# Patient Record
Sex: Male | Born: 1975 | Race: White | Hispanic: No | Smoking: Current every day smoker
Health system: Southern US, Community
[De-identification: ages and names within clinical notes are randomized; demographics above are authoritative.]

## PROBLEM LIST (undated history)

## (undated) DIAGNOSIS — L0390 Cellulitis, unspecified: Secondary | ICD-10-CM

## (undated) DIAGNOSIS — F1910 Other psychoactive substance abuse, uncomplicated: Secondary | ICD-10-CM

## (undated) DIAGNOSIS — F329 Major depressive disorder, single episode, unspecified: Secondary | ICD-10-CM

## (undated) DIAGNOSIS — B1920 Unspecified viral hepatitis C without hepatic coma: Secondary | ICD-10-CM

## (undated) DIAGNOSIS — F32A Depression, unspecified: Secondary | ICD-10-CM

## (undated) DIAGNOSIS — F319 Bipolar disorder, unspecified: Secondary | ICD-10-CM

## (undated) HISTORY — PX: ROTATOR CUFF REPAIR: SHX139

## (undated) HISTORY — PX: SHOULDER SURGERY: SHX246

## (undated) HISTORY — PX: BACK SURGERY: SHX140

---

## 2002-02-14 ENCOUNTER — Inpatient Hospital Stay (HOSPITAL_COMMUNITY): Admission: EM | Admit: 2002-02-14 | Discharge: 2002-02-23 | Payer: Self-pay | Admitting: *Deleted

## 2004-07-16 ENCOUNTER — Emergency Department (HOSPITAL_COMMUNITY): Admission: EM | Admit: 2004-07-16 | Discharge: 2004-07-16 | Payer: Self-pay

## 2004-07-27 ENCOUNTER — Inpatient Hospital Stay (HOSPITAL_COMMUNITY): Admission: RE | Admit: 2004-07-27 | Discharge: 2004-08-03 | Payer: Self-pay | Admitting: Psychiatry

## 2004-07-27 ENCOUNTER — Ambulatory Visit: Payer: Self-pay | Admitting: Psychiatry

## 2010-01-02 ENCOUNTER — Emergency Department (HOSPITAL_COMMUNITY): Admission: EM | Admit: 2010-01-02 | Discharge: 2010-01-02 | Payer: Self-pay | Admitting: Emergency Medicine

## 2010-10-17 ENCOUNTER — Emergency Department (HOSPITAL_COMMUNITY)
Admission: EM | Admit: 2010-10-17 | Discharge: 2010-10-18 | Payer: Self-pay | Source: Home / Self Care | Admitting: Emergency Medicine

## 2011-01-01 LAB — DIFFERENTIAL
Basophils Absolute: 0 10*3/uL (ref 0.0–0.1)
Eosinophils Absolute: 0.3 10*3/uL (ref 0.0–0.7)
Eosinophils Relative: 2 % (ref 0–5)
Lymphocytes Relative: 25 % (ref 12–46)
Lymphs Abs: 3.5 10*3/uL (ref 0.7–4.0)
Monocytes Absolute: 1.9 10*3/uL — ABNORMAL HIGH (ref 0.1–1.0)
Monocytes Relative: 13 % — ABNORMAL HIGH (ref 3–12)

## 2011-01-01 LAB — BASIC METABOLIC PANEL
BUN: 8 mg/dL (ref 6–23)
CO2: 29 mEq/L (ref 19–32)
Calcium: 9.3 mg/dL (ref 8.4–10.5)
Chloride: 102 mEq/L (ref 96–112)
Creatinine, Ser: 0.69 mg/dL (ref 0.4–1.5)
GFR calc non Af Amer: >60 mL/min (ref >60)
Potassium: 4.1 mEq/L (ref 3.5–5.1)
Sodium: 139 mEq/L (ref 135–145)

## 2011-01-01 LAB — RAPID URINE DRUG SCREEN, HOSP PERFORMED
Barbiturates: POSITIVE — AB
Cocaine: NOT DETECTED
Opiates: NOT DETECTED

## 2011-01-01 LAB — URINALYSIS, ROUTINE W REFLEX MICROSCOPIC
Leukocytes, UA: NEGATIVE
Specific Gravity, Urine: 1.029 (ref 1.005–1.030)
Urobilinogen, UA: 1 mg/dL (ref 0.0–1.0)

## 2011-01-01 LAB — HEPATIC FUNCTION PANEL
Alkaline Phosphatase: 66 U/L (ref 39–117)
Bilirubin, Direct: 0.1 mg/dL (ref 0.0–0.3)
Total Bilirubin: 0.7 mg/dL (ref 0.3–1.2)
Total Protein: 7.9 g/dL (ref 6.0–8.3)

## 2011-01-01 LAB — URINE MICROSCOPIC-ADD ON

## 2011-01-01 LAB — CBC: Hemoglobin: 15.6 g/dL (ref 13.0–17.0)

## 2011-01-29 ENCOUNTER — Emergency Department (HOSPITAL_COMMUNITY)
Admission: EM | Admit: 2011-01-29 | Discharge: 2011-01-30 | Disposition: A | Discharge status: Home / Self Care | Payer: Self-pay | Attending: Emergency Medicine | Admitting: Emergency Medicine

## 2011-01-29 DIAGNOSIS — F411 Generalized anxiety disorder: Secondary | ICD-10-CM | POA: Insufficient documentation

## 2011-01-29 DIAGNOSIS — R259 Unspecified abnormal involuntary movements: Secondary | ICD-10-CM | POA: Insufficient documentation

## 2011-01-29 DIAGNOSIS — I10 Essential (primary) hypertension: Secondary | ICD-10-CM | POA: Insufficient documentation

## 2011-01-29 DIAGNOSIS — F3130 Bipolar disorder, current episode depressed, mild or moderate severity, unspecified: Secondary | ICD-10-CM | POA: Insufficient documentation

## 2011-01-29 DIAGNOSIS — IMO0002 Reserved for concepts with insufficient information to code with codable children: Secondary | ICD-10-CM | POA: Insufficient documentation

## 2011-01-29 DIAGNOSIS — F10229 Alcohol dependence with intoxication, unspecified: Secondary | ICD-10-CM | POA: Insufficient documentation

## 2011-01-29 DIAGNOSIS — Z79899 Other long term (current) drug therapy: Secondary | ICD-10-CM | POA: Insufficient documentation

## 2011-01-29 LAB — COMPREHENSIVE METABOLIC PANEL
ALT: 48 U/L (ref 0–53)
AST: 31 U/L (ref 0–37)
CO2: 24 mEq/L (ref 19–32)
Creatinine, Ser: 0.75 mg/dL (ref 0.4–1.5)
Potassium: 3.6 mEq/L (ref 3.5–5.1)
Sodium: 142 mEq/L (ref 135–145)

## 2011-01-29 LAB — CBC
HCT: 42.2 % (ref 39.0–52.0)
Hemoglobin: 15.2 g/dL (ref 13.0–17.0)
MCHC: 36 g/dL (ref 30.0–36.0)
MCV: 87.6 fL (ref 78.0–100.0)
Platelets: 326 10*3/uL (ref 150–400)
RDW: 13 % (ref 11.5–15.5)

## 2011-01-29 LAB — DIFFERENTIAL
Basophils Relative: 0 % (ref 0–1)
Eosinophils Relative: 4 % (ref 0–5)
Monocytes Absolute: 1.2 10*3/uL — ABNORMAL HIGH (ref 0.1–1.0)
Neutro Abs: 4.5 10*3/uL (ref 1.7–7.7)
Neutrophils Relative %: 47 % (ref 43–77)

## 2011-01-29 LAB — ETHANOL: Alcohol, Ethyl (B): 127 mg/dL — ABNORMAL HIGH (ref 0–10)

## 2011-01-29 LAB — URINALYSIS, ROUTINE W REFLEX MICROSCOPIC
Ketones, ur: NEGATIVE mg/dL
Leukocytes, UA: NEGATIVE
Nitrite: NEGATIVE
Urobilinogen, UA: 1 mg/dL (ref 0.0–1.0)
pH: 5.5 (ref 5.0–8.0)

## 2011-01-29 LAB — RAPID URINE DRUG SCREEN, HOSP PERFORMED
Amphetamines: NOT DETECTED
Barbiturates: NOT DETECTED
Cocaine: POSITIVE — AB
Opiates: NOT DETECTED

## 2011-01-29 LAB — URINE MICROSCOPIC-ADD ON

## 2011-03-09 NOTE — Discharge Summary (Signed)
[name] [name], KUAN NO.:  [id number]   MEDICAL RECORD NO.:  [id number]          PATIENT TYPE:  IPS   LOCATION:  0301                          FACILITY:  [facility]   PHYSICIAN:  Jeanice Lim, M.D. DATE OF BIRTH:  07/19/1976   DATE OF ADMISSION:  07/27/2004  DATE OF DISCHARGE:  08/03/2004                                 DISCHARGE SUMMARY   IDENTIFYING DATA:  This is a 35 year old single Caucasian male voluntarily  admitted.  Presenting with a history of alcohol and drug use.  Relapsed two  weeks ago, drinking liquor and beer, up to a fifth daily, using crack and IV  heroin two days ago.  Had been using IV heroin for six years.  Reason for  relapsing, the patient reported that he was doing too well.  Decreased  appetite for six days, impaired sleep, reported mood swings, psychotic  symptoms, behavioral changes and conflict, including fights while  intoxicated.   MEDICATIONS:  Lexapro and Cardura.   ALLERGIES:  PENICILLIN.   PHYSICAL EXAMINATION:  Physical and neurologic exam essentially within  normal limits.   LABORATORY DATA:  Routine admission labs essentially within normal limits.   MENTAL STATUS EXAM:  In bed, unkempt.  Little eye contact.  Speech concrete.  Some poverty of thought.  Mood tired.  Affect flat.  Thought processes goal  directed.  Thought content negative for dangerous ideation, reporting  fleeting suicidal thoughts that come and go.  Judgment and insight were  quite poor and patient was somewhat resistant to treatment recommendations  and participating in treatment.   ADMISSION DIAGNOSES:   AXIS I:  1.  Polysubstance abuse.  2.  Alcohol and cocaine dependence.  3.  Heroin dependence.   AXIS II:  Deferred.   AXIS III:  Hypertension.   AXIS IV:  Moderate (problems with primary support group, financial  stressors, limited insight into severity of addiction and history of  noncompliance).   AXIS V:  30/55.   HOSPITAL COURSE:  The  patient was admitted and ordered routine p.r.n.  medications and underwent further monitoring.  Was encouraged to participate  in individual, group and milieu therapy.  Was placed on detox protocol for  safe withdrawal with close monitoring.  Was encouraged to participated in  substance abuse treatment groups.  Was stabilized on medications targeting  mood.  The patient had been drinking large amounts of alcohol and taking  benzodiazepines.  Had clear withdrawal symptoms, requiring p.r.n. detox  medications.  The patient also reported auditory hallucinations, stating  command for him to kill himself.  He was able to resist this and contract  for safety while in the hospital.  He is optimized on Tegretol, Neurontin  and Risperdal.  Reported fleeting suicidal ideation and severe anxiety with  gradual improvement as he was stabilized on medications.  The patient was  discharged eventually after resolution of dangerous ideation, improvement in  mood.  Mood was euthymic.  Affect brighter.  The patient reported motivation  to be compliant with aftercare plan.  Awareness of significance of addiction  and  impact on mood and health and judgment.  The patient was given  medication education.   DISCHARGE MEDICATIONS:  1.  Cardura 4 mg daily.  2.  Multivitamin.  3.  Lexapro 10 mg q.a.m.  4.  Risperdal 1 mg, 1/2 q.a.m., 1/2 at 4 p.m. and 2 q.h.s.  5.  Trazodone 100 mg, 3 q.h.s.  6.  Tegretol XR 200 mg, 1 q.a.m. and 2 q.h.s.  7.  Vistaril 50 mg q.8h. p.r.n. anxiety.  8.  Neurontin 400 mg at 9 a.m., 4 p.m. and 2 q.h.s.  9.  Cogentin 0.5 mg q.a.m., at 4 p.m. and q.h.s.   FOLLOW UP:  The patient was to follow up with medical follow-up.  Avoid  alcohol and drug use.  Follow up with Dr. Rogelia Mire and walk-in clinic at [facility]  Monday to Friday, 1-4, on [place].   DISCHARGE DIAGNOSES:   AXIS I:  1.  Polysubstance abuse.  2.  Alcohol and cocaine dependence.  3.  Heroin dependence.   AXIS II:   Deferred.   AXIS III:  Hypertension.   AXIS IV:  Moderate (problems with primary support group, financial  stressors, limited insight into severity of addiction and history of  noncompliance).   AXIS V:  Global Assessment of Functioning on discharge 55.     [name] [name]   JEM/MEDQ  D:  09/10/2004  T:  09/10/2004  Job:  [id number]
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
## 2011-03-09 NOTE — Discharge Summary (Signed)
[facility]  Patient:    [name] [name], [name] [name] Visit Number: [id number] MRN: [id number]          Service Type: PSY Location: 300 0300 01 Attending Physician:  Jeanice Lim Dictated by:   Jeanice Lim, M.D. Admit Date:  02/14/2002 Discharge Date: 02/23/2002                             Discharge Summary  IDENTIFYING DATA:  This is a 35 year old separated white male voluntarily admitted for depression, suicidal ideation, alcohol and drug use.  The patient had a history of bipolar disorder.  This was the first hospitalization at [facility]. He had a history of a suicide attempt where he swallowed nitroglycerin pills years ago.  Was detoxified off alcohol and drugs three to four years ago in the past.  ADMISSION MEDICATIONS: 1. Klonopin 2 mg b.i.d. for seizures. 2. Cardura 4 mg q.h.s. 3. Toprol XL 50 mg q.d. for mitral valve prolapse. 4. Nexium 20 mg q.h.s.  ALLERGIES:  KEFLEX.  PHYSICAL EXAMINATION:  GENERAL:  Essentially within normal limits.  NEUROLOGIC:  Nonfocal.  ROUTINE ADMISSION LABORATORY DATA:  Urine drug screen: Positive for barbiturates, benzodiazepines, cannabis, and opiates.  Alcohol level: Less than 10.  White count slightly elevated at 11.2; otherwise, within normal limits.  MENTAL STATUS EXAMINATION:  Alert, young adult, casually dressed male with long hair, experiencing withdrawal symptoms, having abdominal cramping. Speech: Within normal limits.  Mood: Dysphoric and anxious.  Affect: Flat. Thought process: Goal directed, positive for hallucinations, both visual and auditory, denied suicidal or homicidal ideations.  Cognitive: Intact. Judgment and insight: Limited with a history of poor impulse control.  ADMITTING DIAGNOSES: Axis I:    1. Bipolar disorder.            2. Alcohol abuse.            3. Opiate dependence.            4. Cocaine abuse. Axis II:   None. Axis III:  1. Hypertension.            2. Gastroesophageal  reflux disease.            3. Mitral valve prolapse.            4. Seizure disorder. Axis IV:   Moderate problems with primary support, social environment,            housing, legal system, and other psychosocial issues. Axis V:    30/65  HOSPITAL COURSE:  The patient was admitted and ordered routine p.r.n. medications, was started on a phenobarbital detoxification protocol with monitoring for safe withdrawal as well as a Catapres detoxification protocol for detoxification off of opiates.  The patient was titrated on Zyprexa, targeting psychotic symptoms and Loxitane was used for acute control of psychotic symptoms.  Seroquel was further titrated for long-term treatment. The patient was given Symmetrel for cravings.  He tolerated medication changes well and reported no withdrawal symptoms.  CONDITION AT DISCHARGE:  The patient was more calm.  Mood: More euthymic. Affect: Brighter.  Thought process: Goal directed.  Thought content: Negative for dangerous ideation or psychotic symptoms.  DISCHARGE MEDICATIONS: 1. Remeron 15 mg three q.h.s. 2. Seroquel 200 mg one half t.i.d. and two and a half q.h.s. 3. Cardura 4 mg q.a.m. 4. Protonix 40 mg q.a.m. 5. Claritin 10 mg q.a.m. 6. Loxitane 10 mg t.i.d. 7. Klonopin 2 mg b.i.d. 8. Toprol  XL 50 mg q.a.m.  FOLLOWUP:  The patient was discharged to be treated at the [facility] for continued substance abuse treatment.  DISCHARGE DIAGNOSES: Axis I:    1. Bipolar disorder.            2. Alcohol abuse.            3. Opiate dependence.            4. Cocaine abuse. Axis II:   None. Axis III:  1. Hypertension.            2. Gastroesophageal reflux disease.            3. Mitral valve prolapse.            4. Seizure disorder. Axis IV:   Moderate problems with primary support, social environment,            housing, legal system, and other psychosocial issues. Axis V:    Global assessment of functioning on discharge was  55-60. Dictated by:   Jeanice Lim, M.D. Attending Physician:  Jeanice Lim DD:  04/01/02 TD:  04/04/02 Job: [id number]

## 2011-03-09 NOTE — H&P (Signed)
NAME:  [name] [name], [name] [name] NO.:  [id number]   MEDICAL RECORD NO.:  [id number]          PATIENT TYPE:  IPS   LOCATION:  0301                          FACILITY:  [facility]   PHYSICIAN:  Jeanice Lim, M.D. DATE OF BIRTH:  02-03-1976   DATE OF ADMISSION:  07/27/2004  DATE OF DISCHARGE:                         PSYCHIATRIC ADMISSION ASSESSMENT   IDENTIFYING INFORMATION:  A 35 year old single white male who was  voluntarily admitted on July 27, 2004.   HISTORY OF PRESENT ILLNESS:  The patient presents with a history of alcohol  and drug use.  The patient relapsed approximately 2 weeks ago, had been  drinking both liquor and beer, up to 2 cases with a fifth daily.  He has  been using crack cocaine and IV heroin, with the last use of 2 days ago.  The patient reports using IV heroin for approximately 6 years.  The patient  states that the reason he relapsed was that he was doing too good.  He  reports a decreased appetite for the past 6 days, also reports decrease in  sleep.  He denies any mood swings or psychosis.  He states that his behavior  changes when he drinks, that he will have problems with fighting.   PAST PSYCHIATRIC HISTORY:  First admission to [facility], no  outpatient treatment, was detoxed once before.  His longest history of  sobriety has been 7 months.  That was this year.   SOCIAL HISTORY:  He is a 35 year old single white male who has 2 children  ages 34 and 35.  The children are with the mother of those children.  He is  currently living at [facility] x6 months and is intending to return there.  He works in [employer].  No legal problems, and received his GED.   FAMILY HISTORY:  Father and uncle both with alcohol problems.   ALCOHOL DRUG HISTORY:  The patient smokes cigarettes.  His last drink was on  July 26, 2004.  He states he normally drinks alone, does drink in the  morning.  His first drink was at the age of 60, reports problems  with DTs and  seizures in the past.   PAST MEDICAL HISTORY:  Primary care Sherrine Salberg is Dr. Tiburcio Pea in [place].  Medical problems are hypertension.   MEDICATIONS:  Cardura 4 mg daily, Lexapro 10 mg, has been on that for 8-9  months and reports compliance.   DRUG ALLERGIES:  PENICILLIN.   PHYSICAL EXAMINATION:  The patient was assessed at [facility].  He is  disheveled, with poor dental hygiene.  There is no tremor indicated.  Neurological findings are intact.  Vital signs:  Temperature 97.8, 94 heart  rate, 20 respirations, blood pressure 146/75.  Five feet 9 inches tall, 220  pounds.  His CBC is within normal limits. BMET is within normal limits.  Acetaminophen level less than 10, salicylate level less than 4.  Urine drug  screen is positive for cocaine, positive for THC.  Alcohol level was 119.   MENTAL STATUS EXAM:  He is in the bed, he  is unkempt.  He has little eye  contact.  Speech is concrete.  Mood is tired, but patient also appears flat  and tired appearing.  Thought processes are coherent.  Cognitive function  intact.  Memory is fair, judgment is poor, insight is fair.  The patient is  seeking help for his substance abuse.   ADMISSION DIAGNOSES:   AXIS I:  1.  Polysubstance abuse/dependence.  2.  Depressive disorder not otherwise specified.Marland Kitchen   AXIS II:  Deferred.   AXIS III:  Hypertension.   AXIS IV:  Problems with drug use, primary support group, housing.   AXIS V:  Current is 35, past year is 63.   PLAN:  Admission for polysubstance abuse.  Contract for safety, stabilize  mood and thinking.  We will detox safely, encourage fluids, work on relapse.  The patient is to attend AA and NA after discharge, remain alcohol and drug  free.  The patient is to follow up with his primary care physician for his  blood pressure and mental health for his depressive symptoms.   TENTATIVE LENGTH OF CARE:  5-6 days.     Jani   JO/MEDQ  D:  07/29/2004  T:  07/29/2004  Job:   [id number]

## 2013-01-26 ENCOUNTER — Encounter (HOSPITAL_COMMUNITY): Payer: Self-pay | Admitting: Behavioral Health

## 2013-01-26 ENCOUNTER — Inpatient Hospital Stay (HOSPITAL_COMMUNITY)
Admission: EM | Admit: 2013-01-26 | Discharge: 2013-02-05 | DRG: 897 | Disposition: A | Discharge status: Home / Self Care | Payer: Medicare Other | Source: Intra-hospital | Attending: Psychiatry | Admitting: Psychiatry

## 2013-01-26 ENCOUNTER — Encounter (HOSPITAL_COMMUNITY): Payer: Self-pay | Admitting: Emergency Medicine

## 2013-01-26 ENCOUNTER — Emergency Department (HOSPITAL_COMMUNITY)
Admission: EM | Admit: 2013-01-26 | Discharge: 2013-01-26 | Disposition: A | Discharge status: Other Acute Inpatient Hospital | Payer: Medicare Other | Source: Home / Self Care

## 2013-01-26 DIAGNOSIS — F1910 Other psychoactive substance abuse, uncomplicated: Secondary | ICD-10-CM | POA: Insufficient documentation

## 2013-01-26 DIAGNOSIS — F17200 Nicotine dependence, unspecified, uncomplicated: Secondary | ICD-10-CM | POA: Insufficient documentation

## 2013-01-26 DIAGNOSIS — F10239 Alcohol dependence with withdrawal, unspecified: Principal | ICD-10-CM

## 2013-01-26 DIAGNOSIS — F411 Generalized anxiety disorder: Secondary | ICD-10-CM | POA: Diagnosis present

## 2013-01-26 DIAGNOSIS — F10939 Alcohol use, unspecified with withdrawal, unspecified: Principal | ICD-10-CM | POA: Diagnosis present

## 2013-01-26 DIAGNOSIS — F1010 Alcohol abuse, uncomplicated: Secondary | ICD-10-CM

## 2013-01-26 DIAGNOSIS — F0630 Mood disorder due to known physiological condition, unspecified: Secondary | ICD-10-CM | POA: Diagnosis present

## 2013-01-26 DIAGNOSIS — F10932 Alcohol use, unspecified with withdrawal with perceptual disturbance: Secondary | ICD-10-CM | POA: Diagnosis present

## 2013-01-26 DIAGNOSIS — F1920 Other psychoactive substance dependence, uncomplicated: Secondary | ICD-10-CM | POA: Diagnosis present

## 2013-01-26 DIAGNOSIS — Z79899 Other long term (current) drug therapy: Secondary | ICD-10-CM

## 2013-01-26 DIAGNOSIS — F1020 Alcohol dependence, uncomplicated: Secondary | ICD-10-CM | POA: Diagnosis present

## 2013-01-26 HISTORY — DX: Bipolar disorder, unspecified: F31.9

## 2013-01-26 LAB — COMPREHENSIVE METABOLIC PANEL
ALT: 89 U/L — ABNORMAL HIGH (ref 0–53)
BUN: 5 mg/dL — ABNORMAL LOW (ref 6–23)
CO2: 29 mEq/L (ref 19–32)
Chloride: 107 mEq/L (ref 96–112)
Creatinine, Ser: 0.59 mg/dL (ref 0.50–1.35)
GFR calc Af Amer: >90 mL/min (ref >90)
Glucose, Bld: 85 mg/dL (ref 70–99)
Total Bilirubin: 0.2 mg/dL — ABNORMAL LOW (ref 0.3–1.2)

## 2013-01-26 LAB — RAPID URINE DRUG SCREEN, HOSP PERFORMED
Amphetamines: NOT DETECTED
Barbiturates: NOT DETECTED
Benzodiazepines: NOT DETECTED
Cocaine: NOT DETECTED
Opiates: NOT DETECTED
Tetrahydrocannabinol: POSITIVE — AB

## 2013-01-26 LAB — CBC WITH DIFFERENTIAL/PLATELET
Hemoglobin: 15.1 g/dL (ref 13.0–17.0)
Lymphocytes Relative: 29 % (ref 12–46)
MCHC: 34.2 g/dL (ref 30.0–36.0)
Neutro Abs: 5.9 10*3/uL (ref 1.7–7.7)
Neutrophils Relative %: 56 % (ref 43–77)
RBC: 4.66 MIL/uL (ref 4.22–5.81)
RDW: 13.7 % (ref 11.5–15.5)

## 2013-01-26 LAB — URINALYSIS, ROUTINE W REFLEX MICROSCOPIC
Ketones, ur: NEGATIVE mg/dL
Protein, ur: NEGATIVE mg/dL
Specific Gravity, Urine: 1.011 (ref 1.005–1.030)

## 2013-01-26 MED ORDER — FOLIC ACID 1 MG PO TABS
1.0000 mg | ORAL_TABLET | Freq: Every day | ORAL | Status: DC
  Filled 2013-01-26: qty 1

## 2013-01-26 MED ORDER — LORAZEPAM 1 MG PO TABS: 0.0000 mg | ORAL_TABLET | Freq: Two times a day (BID) | ORAL | Status: DC

## 2013-01-26 MED ORDER — THIAMINE HCL 100 MG/ML IJ SOLN: 100.0000 mg | Freq: Every day | INTRAMUSCULAR | Status: DC

## 2013-01-26 MED ORDER — IBUPROFEN 600 MG PO TABS: 600.0000 mg | ORAL_TABLET | Freq: Three times a day (TID) | ORAL | Status: DC | PRN

## 2013-01-26 MED ORDER — NICOTINE 21 MG/24HR TD PT24
21.0000 mg | MEDICATED_PATCH | Freq: Every day | TRANSDERMAL | Status: DC
  Filled 2013-01-26: qty 1

## 2013-01-26 MED ORDER — ZOLPIDEM TARTRATE 5 MG PO TABS: 5.0000 mg | ORAL_TABLET | Freq: Every evening | ORAL | Status: DC | PRN

## 2013-01-26 MED ORDER — LORAZEPAM 1 MG PO TABS: 1.0000 mg | ORAL_TABLET | Freq: Four times a day (QID) | ORAL | Status: DC | PRN

## 2013-01-26 MED ORDER — ALUM & MAG HYDROXIDE-SIMETH 200-200-20 MG/5ML PO SUSP: 30.0000 mL | ORAL | Status: DC | PRN

## 2013-01-26 MED ORDER — ADULT MULTIVITAMIN W/MINERALS CH: 1.0000 | ORAL_TABLET | Freq: Every day | ORAL | Status: DC

## 2013-01-26 MED ORDER — VITAMIN B-1 100 MG PO TABS: 100.0000 mg | ORAL_TABLET | Freq: Every day | ORAL | Status: DC

## 2013-01-26 MED ORDER — LORAZEPAM 2 MG/ML IJ SOLN: 1.0000 mg | Freq: Four times a day (QID) | INTRAMUSCULAR | Status: DC | PRN

## 2013-01-26 MED ORDER — FOLIC ACID 1 MG PO TABS: 1.0000 mg | ORAL_TABLET | Freq: Every day | ORAL | Status: DC

## 2013-01-26 MED ORDER — ADULT MULTIVITAMIN W/MINERALS CH
1.0000 | ORAL_TABLET | Freq: Every day | ORAL | Status: DC
  Filled 2013-01-26: qty 1

## 2013-01-26 MED ORDER — ACETAMINOPHEN 325 MG PO TABS: 650.0000 mg | ORAL_TABLET | ORAL | Status: DC | PRN

## 2013-01-26 MED ORDER — ONDANSETRON HCL 4 MG PO TABS: 4.0000 mg | ORAL_TABLET | Freq: Three times a day (TID) | ORAL | Status: DC | PRN

## 2013-01-26 MED ORDER — LORAZEPAM 1 MG PO TABS
0.0000 mg | ORAL_TABLET | Freq: Four times a day (QID) | ORAL | Status: DC
  Filled 2013-01-26: qty 1

## 2013-01-26 NOTE — ED Provider Notes (Signed)
History     CSN: [id number]  Arrival date & time 01/26/13  1056   First MD Initiated Contact with Patient 01/26/13 1132      Chief Complaint  Patient presents with  . Medical Clearance    (Consider location/radiation/quality/duration/timing/severity/associated sxs/prior treatment) HPI  Patient presents to the emergency department requesting detox from alcohol, Xanax, heroin, methadone and marijuana. He is currently intoxicated and last used an hour ago. He is brought in by 2 friends who have encouraged him to come. The patient says he has tried detox in the past but was unsuccessful. He denies having any suicidal or homicidal ideations. Patient is currently altered because of alcohol intoxication but denies taking more than he normally does. Denies any medical problems and he needs treatment for. Denies any harm to himself. Level V caveat, intoxicated   Friends/family say that he has been using since he was 13 and has seizures when he withdrawals.   No past medical history on file.  Past Surgical History  Procedure Laterality Date  . Back surgery    . Shoulder surgery      No family history on file.  History  Substance Use Topics  . Smoking status: Current Every Day Smoker    Types: Cigarettes  . Smokeless tobacco: Not on file  . Alcohol Use: Yes      Review of Systems Level V caveat, intoxicated   Allergies  Review of patient's allergies indicates no known allergies.  Home Medications  No current outpatient prescriptions on file.  BP 115/71  Pulse 91  Temp(Src) 97.4 F (36.3 C) (Oral)  Resp 16  SpO2 99%  Physical Exam  Nursing note and vitals reviewed. Constitutional: He appears well-developed and well-nourished. No distress.  discheveled  HENT:  Head: Normocephalic and atraumatic.  Eyes: Pupils are equal, round, and reactive to light.  Neck: Normal range of motion. Neck supple.  Cardiovascular: Normal rate and regular rhythm.   Pulmonary/Chest:  Effort normal.  Abdominal: Soft.  Neurological: He is alert.  Skin: Skin is warm and dry.  Psychiatric: His mood appears anxious. He is not agitated, not aggressive and not actively hallucinating. Thought content is not paranoid. He exhibits a depressed mood. He expresses no homicidal and no suicidal ideation. He expresses no suicidal plans and no homicidal plans. He is attentive.    ED Course  Procedures (including critical care time)  Labs Reviewed  CBC WITH DIFFERENTIAL - Abnormal; Notable for the following:    WBC 10.6 (*)    Monocytes Absolute 1.3 (*)    All other components within normal limits  COMPREHENSIVE METABOLIC PANEL - Abnormal; Notable for the following:    BUN 5 (*)    Albumin 3.3 (*)    AST 56 (*)    ALT 89 (*)    Total Bilirubin 0.2 (*)    All other components within normal limits  ETHANOL - Abnormal; Notable for the following:    Alcohol, Ethyl (B) 148 (*)    All other components within normal limits  URINALYSIS, ROUTINE W REFLEX MICROSCOPIC  URINE RAPID DRUG SCREEN (HOSP PERFORMED)   No results found.   1. Alcohol abuse   2. Polysubstance abuse       MDM  ACT consulted, holding orders placed, ETOH orders placed, home meds reviewed.        Dorthula Matas, PA-C 01/26/13 1208  Dorthula Matas, PA-C 01/26/13 1208

## 2013-01-26 NOTE — ED Notes (Signed)
Pt sent over from [facility] for medical clearence detox of etoh,

## 2013-01-26 NOTE — ED Notes (Signed)
[name] [name], [phone], brought pt in to ED; took pts belongings home

## 2013-01-26 NOTE — ED Notes (Signed)
Patient reports he was detox off alcohol, xanax, heroin, methadone, and marijuana.  Patient last used 1 hour ago.  Patient denies SI/HI.

## 2013-01-26 NOTE — BH Assessment (Signed)
Assessment Note   [name] [name] is an 37 y.o. male.Patient presents to the emergency department requesting detox from alcohol, xanax, heroin, methadone and marijuana. Patient admits to using all substance with the exception of methadone. Patient reports daily use of alcohol and marijuana. Patient also reports Heroin use 4x's per month. Patient has used substance since early childhood. He last used all substance today prior to arrival. He arrive to [facility] intoxicated. He last used Heroin-IV 2 weeks ago. Patient has current withdrawal symptoms of chills, hot/cold flashes, and irritability. He has a history of black outs; last episode was 1 week ago. He also has a history of seizures starting at age 39. His last seizure was 6 month's ago ---alcohol related. Patient's longest period of sobriety is 10 months. Patient has received inpatient residential treatment at [facility] 2 yrs ago. Patient also received outpatient substance abuse treatment at [facility] 3 yrs ago.    He denies having any suicidal or homicidal ideations. No AVH's.    Axis I: Polysubstance Dependence Axis II: Deferred Axis III: No past medical history on file. Axis IV: other psychosocial or environmental problems, problems related to social environment, problems with access to health care services and problems with primary support group Axis V: 31-40 impairment in reality testing  Past Medical History: No past medical history on file.  Past Surgical History  Procedure Laterality Date  . Back surgery    . Shoulder surgery      Family History: No family history on file.  Social History:  reports that he has been smoking Cigarettes.  He has been smoking about 0.00 packs per day. He does not have any smokeless tobacco history on file. He reports that  drinks alcohol. He reports that he uses illicit drugs (Marijuana, Benzodiazepines, and Other-see comments).  Additional Social History:  Alcohol / Drug Use Pain Medications: SEE  MAR Prescriptions: SEE MAR Over the Counter: SEE MAR History of alcohol / drug use?: Yes Longest period of sobriety (when/how long): 10 months Negative Consequences of Use: Personal relationships;Financial;Work / School Withdrawal Symptoms: Agitation;Sweats;Fever / Chills;Seizures Onset of Seizures: teens Date of most recent seizure: "almost 6 months ago" Substance #1 Name of Substance 1: Alcohol-beer 1 - Age of First Use: 37 yrs old  1 - Amount (size/oz): 1 case of beer 1 - Frequency: daily for the past 6 months 1 - Duration: 6 months 1 - Last Use / Amount: 01/26/2013; pt reportedly drank 6-7 beers Substance #2 Name of Substance 2: Alcohol-liqour 2 - Age of First Use: 37 yrs old  2 - Amount (size/oz): 1 pint of liqour 2 - Frequency: daily for the past 6 months  2 - Duration: 6 months  2 - Last Use / Amount: 01/26/2013; 1/2 pint Substance #3 Name of Substance 3: Xanax  3 - Age of First Use: 37 yrs old  3 - Amount (size/oz): 20 or more pills 3 - Frequency: 2x's per year 3 - Duration: since age 26 3 - Last Use / Amount: 01/26/2013; over 20 pills (pt reports consuming a bottle of pills) Substance #4 Name of Substance 4: THC 4 - Age of First Use: 37 yrs old  4 - Amount (size/oz): 1 oz 4 - Frequency: daiily  4 - Duration: daily since age 86 4 - Last Use / Amount: 01/26/2013; 1 oz Substance #5 Name of Substance 5: Heroin -IV 5 - Age of First Use: 37 yrs old  5 - Amount (size/oz): patient unable to provide a  specific amount used 5 - Frequency: 4x's per rmonth  5 - Duration: ongoing since age 41 5 - Last Use / Amount: 2 weeks ago Substance #6 Name of Substance 6: EDP noted methodone use, however; patient denies during the assessment 6 - Age of First Use: n/a 6 - Amount (size/oz): n/a 6 - Frequency: n/a 6 - Duration: n/a 6 - Last Use / Amount: n/a  CIWA: CIWA-Ar BP: 104/68 mmHg Pulse Rate: 92 Nausea and Vomiting: no nausea and no vomiting Tactile Disturbances: none Tremor: no  tremor Auditory Disturbances: not present Paroxysmal Sweats: no sweat visible Visual Disturbances: not present Anxiety: no anxiety, at ease Headache, Fullness in Head: none present Agitation: normal activity Orientation and Clouding of Sensorium: oriented and can do serial additions CIWA-Ar Total: 0 COWS: Clinical Opiate Withdrawal Scale (COWS) Resting Pulse Rate: Pulse Rate 81-100 Sweating: No report of chills or flushing Restlessness: Able to sit still Pupil Size: Pupils pinned or normal size for room light Bone or Joint Aches: Not present Runny Nose or Tearing: Not present GI Upset: Stomach cramps Tremor: No tremor Yawning: Yawning once or twice during assessment Anxiety or Irritability: None Gooseflesh Skin: Skin is smooth COWS Total Score: 3  Allergies: No Known Allergies  Home Medications:  (Not in a hospital admission)  OB/GYN Status:  No LMP for male patient.  General Assessment Data Location of Assessment: [facility] ED Living Arrangements: Alone Can pt return to current living arrangement?: Yes Admission Status: Voluntary Is patient capable of signing voluntary admission?: No Transfer from: Acute Hospital Referral Source: Self/Family/Friend  Education Status Is patient currently in school?: No  Risk to self Suicidal Ideation: No Suicidal Intent: No Is patient at risk for suicide?: No Suicidal Plan?: No Access to Means: No What has been your use of drugs/alcohol within the last 12 months?:  (n/a) Previous Attempts/Gestures: No How many times?:  (n/a) Other Self Harm Risks:  (n/a) Triggers for Past Attempts: Other (Comment) (no prevous attempts and/or gestures) Intentional Self Injurious Behavior: None Family Suicide History: No Recent stressful life event(s): Other (Comment) (patient does not identify any stressors) Persecutory voices/beliefs?: No Depression: Yes Depression Symptoms: Feeling angry/irritable Substance abuse history and/or treatment for  substance abuse?: No Suicide prevention information given to non-admitted patients: Not applicable  Risk to Others Homicidal Ideation: No Thoughts of Harm to Others: No Current Homicidal Intent: No Current Homicidal Plan: No Access to Homicidal Means: No Identified Victim:  (n/a) History of harm to others?: No Assessment of Violence: None Noted Violent Behavior Description:  (patient calm and cooperative) Does patient have access to weapons?: No Criminal Charges Pending?: No Does patient have a court date: No  Psychosis Hallucinations: None noted Delusions: None noted  Mental Status Report Appear/Hygiene: Disheveled Eye Contact: Poor Motor Activity: Agitation Speech: Logical/coherent Level of Consciousness: Alert Mood: Anxious;Angry Affect: Appropriate to circumstance Anxiety Level: Minimal Thought Processes: Coherent Judgement: Unimpaired Orientation: Person;Place;Time;Situation Obsessive Compulsive Thoughts/Behaviors: None  Cognitive Functioning Concentration: Decreased Memory: Recent Intact;Remote Intact IQ: Average Insight: Fair Impulse Control: Fair Appetite: Poor Weight Loss:  (none reported) Weight Gain:  (none reported) Sleep: No Change Total Hours of Sleep:  (none reported) Vegetative Symptoms: None  ADLScreening [facility] Assessment Services) Patient's cognitive ability adequate to safely complete daily activities?: Yes Patient able to express need for assistance with ADLs?: Yes Independently performs ADLs?: Yes (appropriate for developmental age)  Abuse/Neglect [facility]) Physical Abuse: Denies Verbal Abuse: Denies Sexual Abuse: Denies  Prior Inpatient Therapy Prior Inpatient Therapy: Yes Prior Therapy Dates:  (  [name]-2 yrs ogo) Prior Therapy Facilty/Provider(s):  Teacher, adult education ) Reason for Treatment:  (substance abuse)  Prior Outpatient Therapy Prior Outpatient Therapy: Yes Prior Therapy Dates:  ([facility]-3 yrs ago) Prior Therapy  Facilty/Provider(s):  ([facility] ) Reason for Treatment:  (substance abuse)  ADL Screening (condition at time of admission) Patient's cognitive ability adequate to safely complete daily activities?: Yes Patient able to express need for assistance with ADLs?: Yes Independently performs ADLs?: Yes (appropriate for developmental age) Weakness of Legs: None Weakness of Arms/Hands: None  Home Assistive Devices/Equipment Home Assistive Devices/Equipment: None    Abuse/Neglect Assessment (Assessment to be complete while patient is alone) Physical Abuse: Denies Verbal Abuse: Denies Sexual Abuse: Denies Exploitation of patient/patient's resources: Denies Values / Beliefs Cultural Requests During Hospitalization: None Spiritual Requests During Hospitalization: None   Advance Directives (For Healthcare) Advance Directive: Patient does not have advance directive Nutrition Screen- [facility] Adult/[facility] Patient's home diet: Regular  Additional Information 1:1 In Past 12 Months?: No CIRT Risk: No Elopement Risk: No Does patient have medical clearance?: Yes     Disposition:  Disposition Initial Assessment Completed for this Encounter: Yes Disposition of Patient: Inpatient treatment program;Referred to [facility]) Type of inpatient treatment program: Adult  On Site Evaluation by:   Reviewed with Physician:     Melynda Ripple [facility] 01/26/2013 7:31 PM

## 2013-01-26 NOTE — Progress Notes (Signed)
Pt listed as self pay. ED CM went to speak with pt but he was asleep CM did not awaken him

## 2013-01-26 NOTE — ED Notes (Signed)
Pt asleep; no s/s of distress noted. 

## 2013-01-27 ENCOUNTER — Encounter (HOSPITAL_COMMUNITY): Payer: Self-pay | Admitting: Psychiatry

## 2013-01-27 DIAGNOSIS — F1010 Alcohol abuse, uncomplicated: Secondary | ICD-10-CM

## 2013-01-27 DIAGNOSIS — F10239 Alcohol dependence with withdrawal, unspecified: Principal | ICD-10-CM | POA: Diagnosis present

## 2013-01-27 DIAGNOSIS — F10932 Alcohol use, unspecified with withdrawal with perceptual disturbance: Secondary | ICD-10-CM | POA: Diagnosis present

## 2013-01-27 DIAGNOSIS — F10939 Alcohol use, unspecified with withdrawal, unspecified: Principal | ICD-10-CM | POA: Diagnosis present

## 2013-01-27 DIAGNOSIS — F1920 Other psychoactive substance dependence, uncomplicated: Secondary | ICD-10-CM | POA: Diagnosis present

## 2013-01-27 MED ORDER — CHLORDIAZEPOXIDE HCL 25 MG PO CAPS
25.0000 mg | ORAL_CAPSULE | Freq: Four times a day (QID) | ORAL | Status: AC
  Administered 2013-01-27 – 2013-01-28 (×6): 25 mg via ORAL
  Filled 2013-01-27 (×6): qty 1

## 2013-01-27 MED ORDER — VITAMIN B-1 100 MG PO TABS
100.0000 mg | ORAL_TABLET | Freq: Every day | ORAL | Status: DC
  Administered 2013-01-28 – 2013-02-04 (×8): 100 mg via ORAL
  Filled 2013-01-27 (×10): qty 1

## 2013-01-27 MED ORDER — ALUM & MAG HYDROXIDE-SIMETH 200-200-20 MG/5ML PO SUSP: 30.0000 mL | ORAL | Status: DC | PRN

## 2013-01-27 MED ORDER — CHLORDIAZEPOXIDE HCL 25 MG PO CAPS
25.0000 mg | ORAL_CAPSULE | ORAL | Status: AC
  Administered 2013-01-29 – 2013-01-30 (×2): 25 mg via ORAL
  Filled 2013-01-27: qty 1

## 2013-01-27 MED ORDER — TRAZODONE HCL 50 MG PO TABS
50.0000 mg | ORAL_TABLET | Freq: Every evening | ORAL | Status: DC | PRN
  Administered 2013-01-27 (×2): 50 mg via ORAL
  Filled 2013-01-27 (×5): qty 1

## 2013-01-27 MED ORDER — NICOTINE 21 MG/24HR TD PT24
21.0000 mg | MEDICATED_PATCH | Freq: Every day | TRANSDERMAL | Status: DC
  Administered 2013-01-27 – 2013-02-04 (×9): 21 mg via TRANSDERMAL
  Filled 2013-01-27 (×10): qty 1

## 2013-01-27 MED ORDER — THIAMINE HCL 100 MG/ML IJ SOLN
100.0000 mg | Freq: Once | INTRAMUSCULAR | Status: AC
  Administered 2013-01-27: 100 mg via INTRAMUSCULAR

## 2013-01-27 MED ORDER — ADULT MULTIVITAMIN W/MINERALS CH
1.0000 | ORAL_TABLET | Freq: Every day | ORAL | Status: DC
  Administered 2013-01-27 – 2013-02-04 (×9): 1 via ORAL
  Filled 2013-01-27 (×11): qty 1

## 2013-01-27 MED ORDER — LOPERAMIDE HCL 2 MG PO CAPS: 2.0000 mg | ORAL_CAPSULE | ORAL | Status: AC | PRN

## 2013-01-27 MED ORDER — ONDANSETRON 4 MG PO TBDP: 4.0000 mg | ORAL_TABLET | Freq: Four times a day (QID) | ORAL | Status: AC | PRN

## 2013-01-27 MED ORDER — CHLORDIAZEPOXIDE HCL 25 MG PO CAPS
25.0000 mg | ORAL_CAPSULE | Freq: Every day | ORAL | Status: AC
  Administered 2013-01-31: 25 mg via ORAL
  Filled 2013-01-27 (×2): qty 1

## 2013-01-27 MED ORDER — CHLORDIAZEPOXIDE HCL 25 MG PO CAPS
25.0000 mg | ORAL_CAPSULE | Freq: Four times a day (QID) | ORAL | Status: AC | PRN
  Administered 2013-01-27 – 2013-01-28 (×3): 25 mg via ORAL
  Filled 2013-01-27 (×4): qty 1

## 2013-01-27 MED ORDER — MAGNESIUM HYDROXIDE 400 MG/5ML PO SUSP
30.0000 mL | Freq: Every day | ORAL | Status: DC | PRN
  Administered 2013-01-31 (×2): 30 mL via ORAL

## 2013-01-27 MED ORDER — CHLORDIAZEPOXIDE HCL 25 MG PO CAPS
25.0000 mg | ORAL_CAPSULE | Freq: Three times a day (TID) | ORAL | Status: AC
  Administered 2013-01-28 – 2013-01-29 (×3): 25 mg via ORAL
  Filled 2013-01-27 (×3): qty 1

## 2013-01-27 MED ORDER — HYDROXYZINE HCL 25 MG PO TABS
25.0000 mg | ORAL_TABLET | Freq: Four times a day (QID) | ORAL | Status: AC | PRN
  Administered 2013-01-27 – 2013-01-29 (×5): 25 mg via ORAL

## 2013-01-27 MED ORDER — ACETAMINOPHEN 325 MG PO TABS
650.0000 mg | ORAL_TABLET | Freq: Four times a day (QID) | ORAL | Status: DC | PRN
  Administered 2013-01-27 – 2013-02-01 (×4): 650 mg via ORAL

## 2013-01-27 MED ORDER — NICOTINE 21 MG/24HR TD PT24: 21.0000 mg | MEDICATED_PATCH | Freq: Every day | TRANSDERMAL | Status: DC

## 2013-01-27 MED ORDER — NICOTINE 14 MG/24HR TD PT24
14.0000 mg | MEDICATED_PATCH | Freq: Every day | TRANSDERMAL | Status: DC
  Filled 2013-01-27: qty 1

## 2013-01-27 NOTE — H&P (Signed)
Psychiatric Admission Assessment Adult  Patient Identification:  [name] [name]  Date of Evaluation:  01/27/2013  Chief Complaint:  Polysubstance Abuse  History of Present Illness: This is a 37 year old Caucasian male. Admitted to [facility] from the [facility] long hospital ED with request for alcohol and drug detoxification treatment. Patient reports, "My friend picked me up from the bus station yesterday. I had come down from [place] to [facility] for drug detox and treatment. I need to get cleaned or else something bad will happen to me. I have been using all kinds of drugs, heroin, Meth, Cocaine, Xanax, methadone, Marijuana and alcohol. I started using very early in life. I can't say any more because I'm hurting. going through withdrawal symptoms. I'm so uncomfortable. I am having the shakes, my stomach cramping, my body aching, I feel nauseated. The longest that I have been sober is 10 months. I have had treatments in the past at the [facility] and [facility]. My mood is not good. My depression is at #8 right now".   Elements:  Location:  [facility] adult unit. Quality:  Cravings. Severity:  "I was using everyday". Timing:  "I have been using non-stop for over 10 days". Duration:  "I ahve been using since my childhood". Context:  Cramping, shakes, Nausea, body aches, anxiousness, Restlessness..  Associated Signs/Synptoms:  Depression Symptoms:  depressed mood, psychomotor agitation, difficulty concentrating, hopelessness, anxiety, insomnia,  (Hypo) Manic Symptoms:  Distractibility, Impulsivity, Irritable Mood,  Anxiety Symptoms:  Excessive Worry,  Psychotic Symptoms:  Hallucinations: Denies  PTSD Symptoms: Had a traumatic exposure:  None reported  Psychiatric Specialty Exam: Physical Exam  Constitutional: He is oriented to person, place, and time. He appears well-developed.  HENT:  Head: Normocephalic.  Eyes: Pupils are equal, round, and reactive to light.  Neck: Normal range of  motion.  Cardiovascular: Normal rate.   Respiratory: Effort normal.  GI: Soft.  Musculoskeletal: Normal range of motion.  Neurological: He is alert and oriented to person, place, and time.  Skin: Skin is warm and dry.  Psychiatric: His speech is normal. Thought content normal. His mood appears anxious. His affect is angry, blunt and inappropriate. He is agitated. Cognition and memory are normal. He expresses inappropriate judgment. He exhibits a depressed mood.    Review of Systems  Constitutional: Positive for chills and diaphoresis.  HENT: Negative.   Eyes: Negative.   Respiratory: Negative.   Cardiovascular: Negative.   Gastrointestinal: Positive for nausea and abdominal pain.  Genitourinary: Negative.   Musculoskeletal: Positive for myalgias.  Skin: Negative.   Neurological: Positive for dizziness and tremors.  Endo/Heme/Allergies: Negative.   Psychiatric/Behavioral: Positive for depression and substance abuse (Abusing cocaine, Crystal meth, Alcohol, Xanax). Negative for suicidal ideas, hallucinations and memory loss. The patient is nervous/anxious and has insomnia.     Blood pressure 126/86, pulse 88, temperature 96.7 F (35.9 C), temperature source Oral, resp. rate 20, height 5\' 8"  (1.727 m), weight 83.008 kg (183 lb).Body mass index is 27.83 kg/(m^2).  General Appearance: Disheveled  Eye Contact::  Poor  Speech:  Clear and Coherent  Volume:  Normal  Mood:  Angry, Depressed and Irritable  Affect:  Restricted  Thought Process:  Coherent  Orientation:  Full (Time, Place, and Person)  Thought Content:  Rumination  Suicidal Thoughts:  No  Homicidal Thoughts:  No  Memory:  Immediate;   Good Recent;   Good Remote;   Good  Judgement:  Impaired  Insight:  Lacking  Psychomotor Activity:  Restlessness  Concentration:  Poor  Recall:  Good  Akathisia:  No  Handed:  Right  AIMS (if indicated):     Assets:  Desire for Improvement  Sleep:  Number of Hours: 3.25    Past  Psychiatric History: Diagnosis: Polysubstance abuse, alcohol dependence  Hospitalizations: [facility]  Outpatient Care: None reported  Substance Abuse Care:  "[facility] and [name] over 3 years ago"  Self-Mutilation: Denies  Suicidal Attempts: Denies attempts and or thoughts  Violent Behaviors: None reported   Past Medical History:   Past Medical History  Diagnosis Date  . Bipolar disorder    Seizure History:  Black outs  Allergies:   Allergies  Allergen Reactions  . Lithium Anaphylaxis  . Penicillins Rash   PTA Medications: No prescriptions prior to admission    Previous Psychotropic Medications:  Medication/Dose  See medication lists               Substance Abuse History in the last 12 months:  yes  Consequences of Substance Abuse: Medical Consequences:  Liver damage, Possible death by overdose Legal Consequences:  Arrests, jail time, Loss of driving privilege. Family Consequences:  Family discord, divorce and or separation.  Social History:  reports that he has been smoking Cigarettes.  He has been smoking about 0.00 packs per day. He does not have any smokeless tobacco history on file. He reports that  drinks alcohol. He reports that he uses illicit drugs (Marijuana, Benzodiazepines, Other-see comments, and Methamphetamines). Additional Social History: History of alcohol / drug use?: Yes Longest period of sobriety (when/how long): 10 months Negative Consequences of Use: Financial;Legal;Personal relationships;Work / School Withdrawal Symptoms: Agitation;Seizures;Sweats;Fever / Chills Onset of Seizures: a couples of years ago Date of most recent seizure: a couple of years sgo Name of Substance 1: etoh beer 1 - Age of First Use: 37 years old 1 - Amount (size/oz): 1 case of beer 1 - Frequency: daily 1 - Duration: 6 months 1 - Last Use / Amount: 01/26/13 6-7 beers Name of Substance 2: etoh liquor 2 - Age of First Use: 37 years old 2 - Amount (size/oz): 1 pint of  liquor 2 - Frequency: daily 2 - Duration: 6 months 2 - Last Use / Amount: 01/26/13  Name of Substance 3: xanax 3 - Age of First Use: 37 years old 3 - Amount (size/oz): 20 or more pills 3 - Frequency: daily 3 - Duration: years 3 - Last Use / Amount: 01/26/2013 20 pills Name of Substance 4: THC 4 - Age of First Use: 37 years old 4 - Amount (size/oz): 1 oz 4 - Frequency: daily  4 - Duration: daily 4 - Last Use / Amount: 01/26/13 1 oz Name of Substance 5: Heroin 5 - Age of First Use: unknown 5 - Amount (size/oz): unknown 5 - Frequency: 4x per month 5 - Duration: ongoing 5 - Last Use / Amount: 2 weeks ago Name of Substance 6: crystal Meth 6 - Age of First Use: n/a 6 - Amount (size/oz): 0.5 grams 6 - Frequency: daily 6 - Duration: daily 6 - Last Use / Amount: 0.5 grams 2 weeks ago  Current Place of Residence: [place], [place]    Place of Birth: [place], [place]  Family Members: "My 2 girls"  Marital Status:  Single  Children: 2  Sons: 0  Daughters: 2  Relationships: Single  Education:  GED  Educational Problems/Performance: Obtained GED  Religious Beliefs/Practices: None reported  History of Abuse (Emotional/Phsycial/Sexual):  Occupational Experiences: Employed  Military History:  None.  Legal History: None reported  Hobbies/Interests: None  Family History:  History reviewed. No pertinent family history.  Results for orders placed during the hospital encounter of 01/26/13 (from the past 72 hour(s))  TSH     Status: Abnormal   Collection Time    01/27/13  6:20 AM      Result Value Range   TSH 0.344 (*) 0.350 - 4.500 uIU/mL   Psychological Evaluations:  Assessment:   AXIS I:  Polysubstance absue/ dependency, Alcohol abuse AXIS II:  Deferred AXIS III:   Past Medical History  Diagnosis Date  . Bipolar disorder    AXIS IV:  economic problems, housing problems, occupational problems and other psychosocial or environmental problems AXIS V:  1-10 persistent  dangerousness to self and others present  Treatment Plan/Recommendations: 1. Admit for crisis management and stabilization, estimated length of stay 3-5 days.  2. Medication management to reduce current symptoms to base line and improve the patient's overall level of functioning. 3. Treat health problems as indicated.  4. Develop treatment plan to decrease risk of relapse upon discharge and the need for readmission.  5. Psycho-social education regarding relapse prevention and self care.  6. Health care follow up as needed for medical problems.  7. Review, reconcile, and reinstate any pertinent home medications for other health issues where appropriate. 8. Call for consults with hospitalist for any additional specialty patient care services as needed.  Treatment Plan Summary: Daily contact with patient to assess and evaluate symptoms and progress in treatment Medication management Supportive approach/coping skills/relapse prevention Detox Reassess co morbidites Current Medications:  Current Facility-Administered Medications  Medication Dose Route Frequency Provider Last Rate Last Dose  . acetaminophen (TYLENOL) tablet 650 mg  650 mg Oral Q6H PRN Kerry Hough, PA-C   650 mg at 01/27/13 1128  . alum & mag hydroxide-simeth (MAALOX/MYLANTA) 200-200-20 MG/5ML suspension 30 mL  30 mL Oral Q4H PRN Kerry Hough, PA-C      . chlordiazePOXIDE (LIBRIUM) capsule 25 mg  25 mg Oral Q6H PRN Kerry Hough, PA-C   25 mg at 01/27/13 0113  . chlordiazePOXIDE (LIBRIUM) capsule 25 mg  25 mg Oral QID Kerry Hough, PA-C   25 mg at 01/27/13 1128   Followed by  . [START ON 01/28/2013] chlordiazePOXIDE (LIBRIUM) capsule 25 mg  25 mg Oral TID Kerry Hough, PA-C       Followed by  . [START ON 01/29/2013] chlordiazePOXIDE (LIBRIUM) capsule 25 mg  25 mg Oral BH-qamhs Spencer E Simon, PA-C       Followed by  . [START ON 01/31/2013] chlordiazePOXIDE (LIBRIUM) capsule 25 mg  25 mg Oral Daily Kerry Hough,  PA-C      . hydrOXYzine (ATARAX/VISTARIL) tablet 25 mg  25 mg Oral Q6H PRN Kerry Hough, PA-C   25 mg at 01/27/13 0113  . loperamide (IMODIUM) capsule 2-4 mg  2-4 mg Oral PRN Kerry Hough, PA-C      . magnesium hydroxide (MILK OF MAGNESIA) suspension 30 mL  30 mL Oral Daily PRN Kerry Hough, PA-C      . multivitamin with minerals tablet 1 tablet  1 tablet Oral Daily Kerry Hough, PA-C   1 tablet at 01/27/13 9604  . nicotine (NICODERM CQ - dosed in mg/24 hours) patch 21 mg  21 mg Transdermal Q0600 Rachael Fee, MD   21 mg at 01/27/13 0636  . ondansetron (ZOFRAN-ODT) disintegrating tablet 4 mg  4 mg Oral  Q6H PRN Kerry Hough, PA-C      . [START ON 01/28/2013] thiamine (VITAMIN B-1) tablet 100 mg  100 mg Oral Daily Kerry Hough, PA-C      . traZODone (DESYREL) tablet 50 mg  50 mg Oral QHS,MR X 1 Spencer E Simon, PA-C   50 mg at 01/27/13 0113    Observation Level/Precautions:  15 minute checks  Laboratory:  Reviewed ED lab findings on file  Psychotherapy: Group sessions   Medications:  See medication lists  Consultations:  As needed  Discharge Concerns:  Maintaining sobriety  Estimated LOS: 5-7 days  Other:     I certify that inpatient services furnished can reasonably be expected to improve the patient's condition.   Armandina Stammer I 4/8/201411:54 AM

## 2013-01-27 NOTE — Progress Notes (Signed)
D: Patient in the bed on approach.  Patient states he had a bad day because he is going through withdrawals.  Patient states she just wants to get off of these drugs.  Patient states he would like to get into some type of long term treatment.  Patient states his friends came to visit him today.  Patient states he has been eating good today.  Patient denies SI/HI and denies AVH. A: Staff to monitor Q 15 mins for safety.  Encouragement and support offered.  Scheduled medications administered per orders.    R: Patient remains safe on the unit.  Patient did not attend group tonight.  Patient taking administered medications.

## 2013-01-27 NOTE — [facility] Suicide Risk Assessment (Signed)
Suicide Risk Assessment  Admission Assessment     Nursing information obtained from:  Patient Demographic factors:  Male;Caucasian;Low socioeconomic status;Divorced or widowed;Unemployed Current Mental Status:    Loss Factors:  Financial problems / change in socioeconomic status Historical Factors:  Family history of mental illness or substance abuse Risk Reduction Factors:  Sense of responsibility to family;Responsible for children under 35 years of age;Living with another person, especially a relative  CLINICAL FACTORS:   Alcohol/Substance Abuse/Dependencies  COGNITIVE FEATURES THAT CONTRIBUTE TO RISK:  Closed-mindedness Thought constriction (tunnel vision)    SUICIDE RISK:   Moderate:  Frequent suicidal ideation with limited intensity, and duration, some specificity in terms of plans, no associated intent, good self-control, limited dysphoria/symptomatology, some risk factors present, and identifiable protective factors, including available and accessible social support.  PLAN OF CARE: Supportive approach/coping skills/relapse prevention                               Detox/consider referral to rehab                               Assess and address the co morbidities I certify that inpatient services furnished can reasonably be expected to improve the patient's condition.  [name] [name] A 01/27/2013, 5:35 PM

## 2013-01-27 NOTE — Progress Notes (Signed)
Recreation Therapy Notes   Date: 04.08.2014  Time: 3:00pm  Location: [address] Dayroom  Group Topic/Focus: Problem Solving   Participation Level:  Did not attend   [employer], LRT/CTRS   Jearl Klinefelter 01/27/2013 4:03 PM

## 2013-01-27 NOTE — Progress Notes (Signed)
Pt refused to attend AA group. 

## 2013-01-27 NOTE — Progress Notes (Signed)
Patient ID: [name] [name], male   DOB: 1975-12-24, 37 y.o.   MRN: [id number] Patient was approached to complete PSA. Patient struggling with withdrawal symptoms and refused for today; will approach tomorrow.  Carney Bern, LCSWA

## 2013-01-27 NOTE — Tx Team (Signed)
Initial Interdisciplinary Treatment Plan  PATIENT STRENGTHS: (choose at least two) Ability for insight Active sense of humor General fund of knowledge Motivation for treatment/growth Supportive family/friends  PATIENT STRESSORS: Marital or family conflict Substance abuse   PROBLEM LIST: Problem List/Patient Goals Date to be addressed Date deferred Reason deferred Estimated date of resolution  "I moved her to get off drugs." Substance Abuse 01/26/2013     Depression 01/26/2013     Anxiety 01/26/2013                                          DISCHARGE CRITERIA:  Ability to meet basic life and health needs Improved stabilization in mood, thinking, and/or behavior Need for constant or close observation no longer present Verbal commitment to aftercare and medication compliance Withdrawal symptoms are absent or subacute and managed without 24-hour nursing intervention  PRELIMINARY DISCHARGE PLAN: Attend aftercare/continuing care group Attend 12-step recovery group Outpatient therapy  PATIENT/FAMIILY INVOLVEMENT: This treatment plan has been presented to and reviewed with the patient, [name] [name].  The patient and family have been given the opportunity to ask questions and make suggestions.  Angeline Slim M 01/27/2013, 12:32 AM

## 2013-01-27 NOTE — Progress Notes (Signed)
D: Patient denies SI/HI and A/V hallucinations; patient reports withdrawal symptoms of tremors, nausea,  Anxiety, and aches and pains  A: Monitored q 15 minutes; patient encouraged to attend groups; patient educated about medications; patient given medications per physician orders; patient encouraged to express feelings and/or concerns  R: Patient is blunted and has slept most of the time.; patient's interaction with staff and peers is isolative but appropriate but cooperative; ; patient is taking medications as prescribed and tolerating medications; patient is not attending all groups

## 2013-01-27 NOTE — Progress Notes (Signed)
NSG Admission Note: 37 y/o white male who presents voluntarily for ETOH,Heroin, xanax, crystal meth detox.  Patient states he rode on a bus for 4 days from [place] and when his friend picked him up he came straight to the hospital.  Patient states he has a history of Bipolar disorder but has not been on medications for years.  Patient states He has been in [place] for 8 years and in his city drugs are everywhere.  Patient states he is tired of living this was and he wants to get his life together.  Patient denies SI/HI and denies AVH.  Patient consents, fall safety plan, POC reviewed and patient verbalized understanding.  Patient skin assessed patient has scratch to left hand and tattoo to her right arm.  Patient emergency contact is his friend Economist [phone].  No belongings and patient did not receive a locker.

## 2013-01-27 NOTE — Progress Notes (Signed)
[facility] LCSW Group Therapy  01/27/2013 1:15 PM  Type of Therapy:  Group Therapy  Participation Level:  None, patient did not attend   [name] [name]

## 2013-01-27 NOTE — Progress Notes (Signed)
[facility] LCSW Aftercare Discharge Planning Group Note   01/27/2013 8:45 AM  Participation Quality:  Did Not attend; unable to awaken  Jakyla Reza, Julious Payer

## 2013-01-27 NOTE — Progress Notes (Signed)
Psychoeducational Group Note  Date:  01/27/2013 Time: 1100  Group Topic/Focus:  Recovery Goals:   The focus of this group is to identify appropriate goals for recovery and establish a plan to achieve them.  Participation Level: Did Not Attend  Participation Quality:  Not Applicable  Affect:  Not Applicable  Cognitive:  Not Applicable  Insight:  Not Applicable  Engagement in Group: Not Applicable  Additional Comments:  Pt remained resting in bed during that group.   Sharyn Lull 01/27/2013, 11:29 AM

## 2013-01-28 DIAGNOSIS — F1920 Other psychoactive substance dependence, uncomplicated: Secondary | ICD-10-CM

## 2013-01-28 DIAGNOSIS — F39 Unspecified mood [affective] disorder: Secondary | ICD-10-CM

## 2013-01-28 MED ORDER — SERTRALINE HCL 50 MG PO TABS: 50.0000 mg | ORAL_TABLET | Freq: Every day | ORAL | Status: DC

## 2013-01-28 MED ORDER — CHLORDIAZEPOXIDE HCL 25 MG PO CAPS
25.0000 mg | ORAL_CAPSULE | Freq: Once | ORAL | Status: AC
  Administered 2013-01-28: 25 mg via ORAL
  Filled 2013-01-28: qty 1

## 2013-01-28 MED ORDER — TRAZODONE HCL 100 MG PO TABS
100.0000 mg | ORAL_TABLET | Freq: Every evening | ORAL | Status: DC | PRN
  Administered 2013-01-28: 100 mg via ORAL
  Filled 2013-01-28 (×4): qty 1

## 2013-01-28 MED ORDER — GABAPENTIN 300 MG PO CAPS
300.0000 mg | ORAL_CAPSULE | Freq: Three times a day (TID) | ORAL | Status: DC
  Administered 2013-01-28 – 2013-01-30 (×7): 300 mg via ORAL
  Filled 2013-01-28 (×11): qty 1

## 2013-01-28 NOTE — Tx Team (Signed)
Interdisciplinary Treatment Plan Update (Adult)  Date: 01/28/2013  Time Reviewed: 10:01 AM   Progress in Treatment: Attending groups: Yes Participating in groups: Yes Taking medication as prescribed:  Yes Tolerating medication:  Yes Family/Significant othe contact made: No Patient understands diagnosis: Yes Discussing patient identified problems/goals with staff: Yes Medical problems stabilized or resolved:  Yes Denies suicidal/homicidal ideation: Yes Patient has not harmed self or Others: Yes  New problem(s) identified: None Identified  Discharge Plan or Barriers:  CSW is assessing for appropriate referrals.   Additional comments: N/A  Reason for Continuation of Hospitalization: Depression Medication stabilization Withdrawal symptoms   Estimated length of stay: 3 days  For review of initial/current patient goals, please see plan of care.  Attendees: Patient:     Family:     Physician:  Geoffery Lyons 01/28/2013 10:01 AM   Nursing:   Burnetta Sabin, RN 01/28/2013 10:01 AM   Clinical Social Worker Ronda Fairly 01/28/2013 10:01 AM   Other:  Leland Johns Intern 01/28/2013 10:01 AM   Other:  Georgina Quint, [place] PA Student 01/28/2013 10:01 AM   Other: Serena Colonel, PA 01/28/2013 10:01 AM   Other:   Maryjean Morn, PA 01/28/2013 10:01 AM   Other Liliane Bade, Community Care Coordinator               01/28/2013 10:01 AM  Scribe for Treatment Team:   Carney Bern, LCSWA  01/28/2013 10:01 AM

## 2013-01-28 NOTE — Progress Notes (Signed)
D: Patient denies SI/HI and A/V hallucinations;reports that he did not sleep well but he did sleep most of the day; patient is not having an easy time with his detox; still presenting with anxiety, sweating, restless, tactile disturbances, and some agitation; reports his appetite is improving, energy level is high; ability to pay attention is poor; rates depression 6/10; hopelessness 6/10  A: Monitored q 15 minutes; patient encouraged to attend groups; patient educated about medications; patient given medications per physician orders; patient encouraged to express feelings and/or concerns; prescribed librium protocol per physician order  R: Patient reports some relief after he has had an as needed dose of librium; patient's interaction with staff and peers is appropriate and cooperative; ; patient is taking medications as prescribed and tolerating medications; patient is attending all groups

## 2013-01-28 NOTE — Progress Notes (Signed)
[facility] LCSW Group Therapy  01/28/2013  1:15 PM  Type of Therapy:  Group Therapy 1:15 to 2:30   Participation Level:  Active   Participation Quality:  Appropriate, Attentive, and Sharing  Affect:  Anxious and Appropriate  Cognitive:  Alert and Oriented  Insight:  Developing/Improving  Engagement in Therapy:  , Engaged  Modes of Intervention:  Clarification, Discussion, Exploration, Reality Testing, Socialization and Support  Summary of Progress/Problems: The focus of this group session was to process how we deal with difficult emotions and share with others the patterns that play out when we are reacting to the emotion verses the situation. [name] [name] shared that one of the most difficult emotions he deals with is pain; he was able to process that his use of substances is no longer working to numb the pain.    [name] [name]

## 2013-01-28 NOTE — Progress Notes (Signed)
Patient ID: [name] [name], male   DOB: 07-02-1976, 37 y.o.   MRN: [id number] D: pt. C/o "I'm making it, just trying to get that alcohol out of me." A: Writer introduce self to client, administered Librium 25mg  for tremors.  Writer encourage pt. To report any other s/s of withdrawal. Staff will monitor q74min for safety. Staff encouraged group.  R: Pt. Is safe on the unit. Pt. Attended group.

## 2013-01-28 NOTE — [facility] Group Notes (Signed)
[facility] LCSW Aftercare Discharge Planning Group Note   01/28/2013  8:45 AM  Participation Quality:  Adequate  Mood/Affect:  Anxious, Depressed and Irritable  Depression Rating:  5  Anxiety Rating:  8  Thoughts of Suicide:  No Will you contract for safety?   NA  Current AVH:  No  Plan for Discharge/Comments:  Patient wishes to follow up at [facility] or [facility]; other wise will go to halfway or [facility]  Transportation Means: Friends  Supports: Friends from recovery community in [place], [place]

## 2013-01-28 NOTE — Progress Notes (Signed)
Doctor gave instruction for nurse to give Librium at this time to the patient; physician(Dr. Dub Mikes) is aware of the amount of doses this patient has had today and the frequency; Nurse will place order as instructed

## 2013-01-28 NOTE — ED Provider Notes (Signed)
Medical screening examination/treatment/procedure(s) were conducted as a shared visit with non-physician practitioner(s) and myself.  I personally evaluated the patient during the encounter Pt requests detox/rehab re abuse multiple substances. Alert, content. Labs.   Suzi Roots, MD 01/28/13 [phone]

## 2013-01-28 NOTE — Progress Notes (Signed)
Nutrition Brief Note  Patient identified on the Malnutrition Screening Tool (MST) Report  Body mass index is 27.83 kg/(m^2). Patient meets criteria for overweight based on current BMI.   Current diet order is regular, patient is consuming approximately poor% of meals at this time. Labs and medications reviewed.   Ht: 5'8", Wt:  183 lbs.  Patient reports UBW of 225 lbs 6 months ago.  Patient with polysubstance abuse currently with poor po intake secondary to detox.  Nausea at times.  Patient refuses nutritional supplements at this time.  Will continue MVI daily.    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  No further nutrition interventions warranted at this time. If further nutrition issues arise, please consult RD.   Oran Rein, RD, LDN Clinical Inpatient Dietitian Pager:  [phone] Weekend and after hours pager:  [phone]   ]
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
## 2013-01-28 NOTE — Progress Notes (Signed)
[facility] MD Progress Note  01/28/2013 5:31 PM [name] [name]  MRN:  [id number] Subjective:  [name] [name] continues to detox. He admits that he has struggled with mood instability as well as depression. He endorses that in the past he used Neurontin and Zoloft successfully. States he wanted to come to [place] to get himself clean, back in Recovery. Has been using continuously for the last four years while in [place]. States that he cant continue to live like this.  Diagnosis:  Polysubstance Dependence, Mood disorder NOS, Anxiety Disorder NOS  ADL's:  Intact  Sleep: Fair  Appetite:  Poor  Suicidal Ideation:  Plan:  denies Intent:  denies Means:  denies Homicidal Ideation:  Plan:  denies Intent:  denies Means:  denies AEB (as evidenced by):  Psychiatric Specialty Exam: Review of Systems  Constitutional: Positive for diaphoresis.  HENT: Negative.   Eyes: Negative.   Respiratory: Negative.   Cardiovascular: Negative.   Gastrointestinal: Negative.   Genitourinary: Negative.   Musculoskeletal: Negative.   Skin: Negative.   Neurological: Positive for tremors and weakness.  Endo/Heme/Allergies: Negative.   Psychiatric/Behavioral: Positive for depression and substance abuse. The patient is nervous/anxious and has insomnia.     Blood pressure 142/87, pulse 109, temperature 96.3 F (35.7 C), temperature source Oral, resp. rate 20, height 5\' 8"  (1.727 m), weight 83.008 kg (183 lb).Body mass index is 27.83 kg/(m^2).  General Appearance: Disheveled  Eye Solicitor::  Fair  Speech:  Clear and Coherent, Slow and not spontaneous  Volume:  Decreased  Mood:  Anxious, Dysphoric and "rough"  Affect:  Restricted  Thought Process:  Coherent and Goal Directed  Orientation:  Full (Time, Place, and Person)  Thought Content:  symptoms, worries,concerns  Suicidal Thoughts:  No  Homicidal Thoughts:  No  Memory:  Immediate;   Fair Recent;   Fair Remote;   Fair  Judgement:  Fair  Insight:  Present   Psychomotor Activity:  Restlessness  Concentration:  Fair  Recall:  Fair  Akathisia:  No  Handed:  Right  AIMS (if indicated):     Assets:  Desire for Improvement  Sleep:  Number of Hours: 2.5   Current Medications: Current Facility-Administered Medications  Medication Dose Route Frequency Provider Last Rate Last Dose  . acetaminophen (TYLENOL) tablet 650 mg  650 mg Oral Q6H PRN Kerry Hough, PA-C   650 mg at 01/27/13 1128  . alum & mag hydroxide-simeth (MAALOX/MYLANTA) 200-200-20 MG/5ML suspension 30 mL  30 mL Oral Q4H PRN Kerry Hough, PA-C      . chlordiazePOXIDE (LIBRIUM) capsule 25 mg  25 mg Oral Q6H PRN Kerry Hough, PA-C   25 mg at 01/28/13 0951  . chlordiazePOXIDE (LIBRIUM) capsule 25 mg  25 mg Oral TID Kerry Hough, PA-C   25 mg at 01/28/13 1725   Followed by  . [START ON 01/29/2013] chlordiazePOXIDE (LIBRIUM) capsule 25 mg  25 mg Oral BH-qamhs [name] E Simon, PA-C       Followed by  . [START ON 01/31/2013] chlordiazePOXIDE (LIBRIUM) capsule 25 mg  25 mg Oral Daily Kerry Hough, PA-C      . gabapentin (NEURONTIN) capsule 300 mg  300 mg Oral TID Rachael Fee, MD   300 mg at 01/28/13 1725  . hydrOXYzine (ATARAX/VISTARIL) tablet 25 mg  25 mg Oral Q6H PRN Kerry Hough, PA-C   25 mg at 01/28/13 0834  . loperamide (IMODIUM) capsule 2-4 mg  2-4 mg Oral PRN Kerry Hough, PA-C      .  magnesium hydroxide (MILK OF MAGNESIA) suspension 30 mL  30 mL Oral Daily PRN Kerry Hough, PA-C      . multivitamin with minerals tablet 1 tablet  1 tablet Oral Daily Kerry Hough, PA-C   1 tablet at 01/28/13 [phone]  . nicotine (NICODERM CQ - dosed in mg/24 hours) patch 21 mg  21 mg Transdermal Q0600 Rachael Fee, MD   21 mg at 01/28/13 7846  . ondansetron (ZOFRAN-ODT) disintegrating tablet 4 mg  4 mg Oral Q6H PRN Kerry Hough, PA-C      . thiamine (VITAMIN B-1) tablet 100 mg  100 mg Oral Daily Kerry Hough, PA-C   100 mg at 01/28/13 0738  . traZODone (DESYREL) tablet 100 mg   100 mg Oral QHS,MR X 1 Rachael Fee, MD        Lab Results:  Results for orders placed during the hospital encounter of 01/26/13 (from the past 48 hour(s))  TSH     Status: Abnormal   Collection Time    01/27/13  6:20 AM      Result Value Range   TSH 0.344 (*) 0.350 - 4.500 uIU/mL    Physical Findings: AIMS: Facial and Oral Movements Muscles of Facial Expression: None, normal Lips and Perioral Area: None, normal Jaw: None, normal Tongue: None, normal,Extremity Movements Upper (arms, wrists, hands, fingers): None, normal Lower (legs, knees, ankles, toes): Minimal, Trunk Movements Neck, shoulders, hips: None, normal, Overall Severity Severity of abnormal movements (highest score from questions above): None, normal Incapacitation due to abnormal movements: None, normal, Dental Status Current problems with teeth and/or dentures?: Yes Does patient usually wear dentures?: No  CIWA:  CIWA-Ar Total: 8 COWS:  COWS Total Score: 6  Treatment Plan Summary: Daily contact with patient to assess and evaluate symptoms and progress in treatment Medication management  Plan: Supportive approach/coping skills/relapse prevention           Continue detox            Reassess co morbidities: will start the Neurontin and the Zoloft  Medical Decision Making Problem Points:  Review of psycho-social stressors (1) Data Points:  Review of medication regiment & side effects (2)  I certify that inpatient services furnished can reasonably be expected to improve the patient's condition.   Salvator Seppala A 01/28/2013, 5:31 PM

## 2013-01-29 LAB — HEPATITIS PANEL, ACUTE
HCV Ab: REACTIVE — AB
Hep A IgM: NEGATIVE
Hep B C IgM: NEGATIVE
Hepatitis B Surface Ag: NEGATIVE

## 2013-01-29 LAB — HIV ANTIBODY (ROUTINE TESTING W REFLEX): HIV: NONREACTIVE

## 2013-01-29 MED ORDER — TRAZODONE HCL 100 MG PO TABS
150.0000 mg | ORAL_TABLET | Freq: Every evening | ORAL | Status: DC | PRN
  Administered 2013-01-29 – 2013-02-04 (×7): 150 mg via ORAL
  Filled 2013-01-29 (×11): qty 3
  Filled 2013-01-29: qty 42
  Filled 2013-01-29 (×4): qty 3
  Filled 2013-01-29: qty 42
  Filled 2013-01-29: qty 3

## 2013-01-29 MED ORDER — BUSPIRONE HCL 15 MG PO TABS
7.5000 mg | ORAL_TABLET | Freq: Two times a day (BID) | ORAL | Status: DC
  Administered 2013-01-29 – 2013-01-30 (×2): 7.5 mg via ORAL
  Filled 2013-01-29 (×4): qty 1

## 2013-01-29 MED ORDER — SERTRALINE HCL 50 MG PO TABS
50.0000 mg | ORAL_TABLET | Freq: Every day | ORAL | Status: DC
  Administered 2013-01-29 – 2013-01-31 (×3): 50 mg via ORAL
  Filled 2013-01-29 (×6): qty 1

## 2013-01-29 NOTE — Progress Notes (Signed)
D:  [name] [name] reports that he did not sleep well and that his appetite is not good.  He rates his depression and hopelessness at 5/10 and denies SI/HI/AVH at this time.  He does report withdrawal symptoms of anxiety, cravings and tremors.  He received Vistaril twice today for anxiety.  He is attending groups and is interacting appropriately with staff and peers. A:  Medications administered as ordered.  Safety checks q 15 minutes.  Emotional support provided. R:  Safety maintained on unit.

## 2013-01-29 NOTE — Progress Notes (Signed)
[facility] LCSW Aftercare Discharge Planning Group Note   01/29/2013  8:45 AM  Participation Quality:  Appropriate  Mood/Affect:  Irritable  Depression Rating:  6  Anxiety Rating:  10  Thoughts of Suicide:  No Will you contract for safety?   NA  Current AVH:  No  Plan for Discharge/Comments: Plan is currently to discharge to [facility] or [name].  Patient willing to apply to [facility] if nothing comes through from either [facility] or [facility] on 4/11  Transportation Means:  Friends  Supports: Recovery community in [place], [place]

## 2013-01-29 NOTE — Progress Notes (Signed)
Recreation Therapy Notes  Date: 04.10.2014 Time: 3:00pm Location: [facility] [place]      Group Topic/Focus: Teamwork, Communication, Problem Solving  Participation Level: Active  Participation Quality: Appropriate  Affect: Euthymic  Cognitive: Appropriate   Additional Comments: Activity: Drop the Ball; Explaination: Patient were broken up into 2 teams. Patients were given 10 drinking straws, a length of masking tape and asked to create a landing pad that would catch a ping pong ball from appropriately 5 feet in the air. Patients were given 20 minutes to work together to create the landing pad.   Patient actively participated in group activity. Patient team unsuccessful at building a landing pad to catch a ping pong ball. Patient worked well with peers. Patient listened to wrap up discussion about using communications skills and problem solving skills to create a support system post d/c.   Marykay Lex Keylor Rands, LRT/CTRS  Jearl Klinefelter 01/29/2013 4:46 PM

## 2013-01-29 NOTE — Progress Notes (Signed)
Patient ID: [name] [name], male   DOB: 1976/06/16, 37 y.o.   MRN: [id number] D: Pt. Visible on the unit, seen in day room. Pt. Is reports anxiety at "8" and depression at "4". Pt. Also concerned about labs. Pt. Says he has been using since about 37 years old and he is ready to quit. "I ain't go no choice the doctor says my liver enzymes don't look good" Pt. Reports that he has a lot of support from the people at [facility]". Pt. Says he will be going to a 28 day program. A: Writer encourages pt. To move forward with positive plans towards recovery. Staff will monitor q17min for safety. Staff encourages group. R: Pt. Is safe on the unit. Pt. Attends karaoke.

## 2013-01-29 NOTE — Progress Notes (Signed)
[facility] MD Progress Note  01/29/2013 3:44 PM [name] [name]  MRN:  [id number] Subjective:  [name] [name]'s Hep C screenning was positive. States that he remembers sharing needles with a male who has it. He is still not feeling right. Admits he was using a lot of drugs. Understands that he is not going to feel that great for a while.His mood is still unstable. He admit to feeling depressed and anxious. He remembers the combination of Zoloft and Buspar as being effective. Plans to stay in [place] and work on long term abstinence Diagnosis:  Polysubstance Dependence, Anxiety Disorder NOS, Depressive Disorder NOS  ADL's:  Intact  Sleep: Poor  Appetite:  Poor  Suicidal Ideation:  Plan:  denies Intent:  denies Means:  denies Homicidal Ideation:  Plan:  denies Intent:  denies Means:  denies AEB (as evidenced by):  Psychiatric Specialty Exam: Review of Systems  Constitutional: Negative.   HENT: Negative.   Eyes: Negative.   Respiratory: Negative.   Cardiovascular: Negative.   Gastrointestinal: Negative.   Genitourinary: Negative.   Musculoskeletal: Negative.   Skin: Negative.   Neurological: Negative.   Endo/Heme/Allergies: Negative.   Psychiatric/Behavioral: Positive for depression and substance abuse. The patient is nervous/anxious and has insomnia.     Blood pressure 111/78, pulse 86, temperature 96.6 F (35.9 C), temperature source Oral, resp. rate 16, height 5\' 8"  (1.727 m), weight 83.008 kg (183 lb).Body mass index is 27.83 kg/(m^2).  General Appearance: Disheveled  Eye Solicitor::  Fair  Speech:  Clear and Coherent and Slow  Volume:  Decreased  Mood:  Anxious, Depressed and worried  Affect:  Restricted  Thought Process:  Coherent and Goal Directed  Orientation:  Full (Time, Place, and Person)  Thought Content:  worries, concerns  Suicidal Thoughts:  No  Homicidal Thoughts:  No  Memory:  Immediate;   Fair Recent;   Fair Remote;   Fair  Judgement:  Fair  Insight:  Present   Psychomotor Activity:  Restlessness  Concentration:  Fair  Recall:  Fair  Akathisia:  No  Handed:  Right  AIMS (if indicated):     Assets:  Desire for Improvement  Sleep:  Number of Hours: 6.75   Current Medications: Current Facility-Administered Medications  Medication Dose Route Frequency Provider Last Rate Last Dose  . acetaminophen (TYLENOL) tablet 650 mg  650 mg Oral Q6H PRN Kerry Hough, PA-C   650 mg at 01/29/13 4098  . alum & mag hydroxide-simeth (MAALOX/MYLANTA) 200-200-20 MG/5ML suspension 30 mL  30 mL Oral Q4H PRN Kerry Hough, PA-C      . busPIRone (BUSPAR) tablet 7.5 mg  7.5 mg Oral BID Rachael Fee, MD      . chlordiazePOXIDE (LIBRIUM) capsule 25 mg  25 mg Oral Q6H PRN Kerry Hough, PA-C   25 mg at 01/28/13 1940  . chlordiazePOXIDE (LIBRIUM) capsule 25 mg  25 mg Oral BH-qamhs Kerry Hough, PA-C       Followed by  . [START ON 01/31/2013] chlordiazePOXIDE (LIBRIUM) capsule 25 mg  25 mg Oral Daily Kerry Hough, PA-C      . gabapentin (NEURONTIN) capsule 300 mg  300 mg Oral TID Rachael Fee, MD   300 mg at 01/29/13 1158  . hydrOXYzine (ATARAX/VISTARIL) tablet 25 mg  25 mg Oral Q6H PRN Kerry Hough, PA-C   25 mg at 01/29/13 0951  . loperamide (IMODIUM) capsule 2-4 mg  2-4 mg Oral PRN Kerry Hough, PA-C      .  magnesium hydroxide (MILK OF MAGNESIA) suspension 30 mL  30 mL Oral Daily PRN Kerry Hough, PA-C      . multivitamin with minerals tablet 1 tablet  1 tablet Oral Daily Kerry Hough, PA-C   1 tablet at 01/29/13 0803  . nicotine (NICODERM CQ - dosed in mg/24 hours) patch 21 mg  21 mg Transdermal Q0600 Rachael Fee, MD   21 mg at 01/29/13 0640  . ondansetron (ZOFRAN-ODT) disintegrating tablet 4 mg  4 mg Oral Q6H PRN Kerry Hough, PA-C      . sertraline (ZOLOFT) tablet 50 mg  50 mg Oral Daily Rachael Fee, MD   50 mg at 01/29/13 1158  . thiamine (VITAMIN B-1) tablet 100 mg  100 mg Oral Daily Kerry Hough, PA-C   100 mg at 01/29/13 0865  .  traZODone (DESYREL) tablet 150 mg  150 mg Oral QHS,MR X 1 Rachael Fee, MD        Lab Results:  Results for orders placed during the hospital encounter of 01/26/13 (from the past 48 hour(s))  HEPATITIS PANEL, ACUTE     Status: Abnormal (Preliminary result)   Collection Time    01/28/13  7:44 PM      Result Value Range   Hepatitis B Surface Ag NEGATIVE  NEGATIVE   HCV Ab Reactive (*) NEGATIVE   Comment: (NOTE)                                                                               This test is for screening purposes only.  Reactive results should be     confirmed by an alternative method.  Suggest HCV Qualitative, PCR,     test code 78469.  Specimens will be stable for reflex testing up to 3     days after collection.   Hep A IgM PENDING  NEGATIVE   Hep B C IgM PENDING  NEGATIVE  HIV ANTIBODY (ROUTINE TESTING)     Status: None   Collection Time    01/28/13  7:44 PM      Result Value Range   HIV NON REACTIVE  NON REACTIVE    Physical Findings: AIMS: Facial and Oral Movements Muscles of Facial Expression: None, normal Lips and Perioral Area: None, normal Jaw: None, normal Tongue: None, normal,Extremity Movements Upper (arms, wrists, hands, fingers): None, normal Lower (legs, knees, ankles, toes): None, normal, Trunk Movements Neck, shoulders, hips: None, normal, Overall Severity Severity of abnormal movements (highest score from questions above): None, normal Incapacitation due to abnormal movements: None, normal Patient's awareness of abnormal movements (rate only patient's report): No Awareness, Dental Status Current problems with teeth and/or dentures?: Yes Does patient usually wear dentures?: No  CIWA:  CIWA-Ar Total: 6 COWS:  COWS Total Score: 6  Treatment Plan Summary: Daily contact with patient to assess and evaluate symptoms and progress in treatment Medication management  Plan: Supportive approach/coping skills/relapse prevention           Zoloft 50 mg  daily/Buspar 7.5 mg BID           Confirm Hep C result  Medical Decision Making Problem Points:  Review of psycho-social stressors (1)  Data Points:  Review of medication regiment & side effects (2) Review of new medications or change in dosage (2)  I certify that inpatient services furnished can reasonably be expected to improve the patient's condition.   Shamicka Inga A 01/29/2013, 3:44 PM

## 2013-01-29 NOTE — Care Management Utilization Note (Signed)
   Per State Regulation 482.30  This chart was reviewed for necessity with respect to the patient's Admission/ Duration of stay.  Next review date: 02/01/13  Nicolasa Ducking RN, BSN

## 2013-01-29 NOTE — Progress Notes (Signed)
Adult Psychoeducational Group Note  Date:  01/29/2013 Time:  6:52 PM  Group Topic/Focus:  Overcoming Stress:   The focus of this group is to define stress and help patients assess their triggers.  Participation Level:  Active  Participation Quality:  Appropriate  Affect:  Appropriate  Cognitive:  Appropriate  Insight: Appropriate  Engagement in Group:  Engaged  Modes of Intervention:  Discussion, Education and Support  Additional Comments:   Pt actively participated in group by completing "Stress Interview" worksheet with a partner. Group discussed triggers for stress, physical signs of stress, and positive coping skills for dealing with stress.    Reinaldo Raddle K 01/29/2013, 6:52 PM

## 2013-01-29 NOTE — Progress Notes (Signed)
Patient did attend the evening karaoke group. Pt was supportive but not very attentive. Pt was enjoying socializing with peers.

## 2013-01-29 NOTE — [facility] Group Notes (Signed)
[facility] LCSW Group Therapy  01/29/2013 1:15 PM  Type of Therapy:  Group Therapy from 1:15 to 2:30 pm  Participation Level:  Active  Participation Quality:  Attentive and Sharing  Affect:  Anxious  Cognitive:  Oriented  Insight:  Improving  Engagement in Therapy:  Developing/Improving  Modes of Intervention:  Clarification, Discussion, Rapport Building, Socialization and Support  Summary of Progress/Problems: Focus of group processing discussion was on balance in life; the components in life which have a negative influence on balance and the components which make for a more balanced life.  Patient shared his difficulties with addiction and was able to process that he neglects to play out the consequences of using "I just play it through to the relief I get from using."  Patient was able to share that the support he gets from attending AA, working with sponsor, taking meetings into prison and hanging out with people in recovery creates balance in his life.  "I know when I am getting out of balance, I stop checking in with people, I keep secrets."  Adalia Pettis, Julious Payer

## 2013-01-30 DIAGNOSIS — F329 Major depressive disorder, single episode, unspecified: Secondary | ICD-10-CM

## 2013-01-30 DIAGNOSIS — F411 Generalized anxiety disorder: Secondary | ICD-10-CM

## 2013-01-30 DIAGNOSIS — F1920 Other psychoactive substance dependence, uncomplicated: Secondary | ICD-10-CM

## 2013-01-30 MED ORDER — QUETIAPINE FUMARATE 50 MG PO TABS
50.0000 mg | ORAL_TABLET | Freq: Three times a day (TID) | ORAL | Status: DC | PRN
  Administered 2013-01-30 – 2013-02-03 (×8): 50 mg via ORAL
  Filled 2013-01-30 (×7): qty 1

## 2013-01-30 MED ORDER — QUETIAPINE FUMARATE 50 MG PO TABS
50.0000 mg | ORAL_TABLET | Freq: Once | ORAL | Status: AC
  Administered 2013-01-30: 50 mg via ORAL
  Filled 2013-01-30 (×2): qty 1

## 2013-01-30 MED ORDER — BUSPIRONE HCL 10 MG PO TABS
10.0000 mg | ORAL_TABLET | Freq: Two times a day (BID) | ORAL | Status: DC
  Administered 2013-01-30 – 2013-02-02 (×6): 10 mg via ORAL
  Filled 2013-01-30 (×10): qty 1

## 2013-01-30 MED ORDER — HYDROXYZINE HCL 25 MG PO TABS
25.0000 mg | ORAL_TABLET | Freq: Four times a day (QID) | ORAL | Status: DC | PRN
  Administered 2013-02-01 – 2013-02-02 (×2): 25 mg via ORAL

## 2013-01-30 MED ORDER — GABAPENTIN 400 MG PO CAPS
400.0000 mg | ORAL_CAPSULE | Freq: Three times a day (TID) | ORAL | Status: DC
  Administered 2013-01-30 – 2013-01-31 (×4): 400 mg via ORAL
  Filled 2013-01-30 (×8): qty 1

## 2013-01-30 NOTE — [facility] Group Notes (Signed)
[facility] LCSW Aftercare Discharge Planning Group Note   01/30/2013  8:45 AM  Participation Quality:  Appropriate  Mood/Affect:  Anxious and Irritable  Depression Rating:  5  Anxiety Rating:  8  Thoughts of Suicide:  No Will you contract for safety?   NA  Current AVH:  Negative  Plan for Discharge/Comments:  CSW still exploring; have a date for [facility] residential next week; also faxing referral to [facility] and [facility]  Transportation Means: Friends   Supports: AA friends in area, visitors daily  [employer], [name] [name]

## 2013-01-30 NOTE — Progress Notes (Signed)
Patient ID: [name] [name], male   DOB: July 16, 1976, 37 y.o.   MRN: [id number] [facility] MD Progress Note  01/30/2013 12:22 PM RYOTA TREECE  MRN:  [id number]  Subjective:  Jceon's Hep C screenning was positive, currently awaiting the confirmation result. Emidio continue to endorse increased anxiety, rated anxiety at #8 and depression #5. He states that he drinks because that was the only way to control his anxiety symptoms. He adds that he always jittery. Sleep is improving he reports.  Diagnosis:  Polysubstance Dependence, Anxiety Disorder NOS, Depressive Disorder NOS  ADL's:  Intact  Sleep: Improving some.  Appetite:  Poor  Suicidal Ideation:  Plan:  denies Intent:  denies Means:  denies Homicidal Ideation:  Plan:  denies Intent:  denies Means:  denies AEB (as evidenced by):  Psychiatric Specialty Exam: Review of Systems  Constitutional: Negative.   HENT: Negative.   Eyes: Negative.   Respiratory: Negative.   Cardiovascular: Negative.   Gastrointestinal: Negative.   Genitourinary: Negative.   Musculoskeletal: Negative.   Skin: Negative.   Neurological: Negative.   Endo/Heme/Allergies: Negative.   Psychiatric/Behavioral: Positive for depression and substance abuse. The patient is nervous/anxious and has insomnia.     Blood pressure 117/82, pulse 99, temperature 97.1 F (36.2 C), temperature source Oral, resp. rate 16, height 5\' 8"  (1.727 m), weight 83.008 kg (183 lb).Body mass index is 27.83 kg/(m^2).  General Appearance: Disheveled  Eye Solicitor::  Fair  Speech:  Clear and Coherent and Slow  Volume:  Decreased  Mood:  Anxious, Depressed and worried  Affect:  Restricted  Thought Process:  Coherent and Goal Directed  Orientation:  Full (Time, Place, and Person)  Thought Content:  worries, concerns  Suicidal Thoughts:  No  Homicidal Thoughts:  No  Memory:  Immediate;   Fair Recent;   Fair Remote;   Fair  Judgement:  Fair  Insight:  Present  Psychomotor Activity:   Restlessness  Concentration:  Fair  Recall:  Fair  Akathisia:  No  Handed:  Right  AIMS (if indicated):     Assets:  Desire for Improvement  Sleep:  Number of Hours: 5.75   Current Medications: Current Facility-Administered Medications  Medication Dose Route Frequency Provider Last Rate Last Dose  . acetaminophen (TYLENOL) tablet 650 mg  650 mg Oral Q6H PRN Kerry Hough, PA-C   650 mg at 01/30/13 0651  . alum & mag hydroxide-simeth (MAALOX/MYLANTA) 200-200-20 MG/5ML suspension 30 mL  30 mL Oral Q4H PRN Kerry Hough, PA-C      . busPIRone (BUSPAR) tablet 10 mg  10 mg Oral BID Sanjuana Kava, NP      . Melene Muller ON 01/31/2013] chlordiazePOXIDE (LIBRIUM) capsule 25 mg  25 mg Oral Daily Kerry Hough, PA-C      . gabapentin (NEURONTIN) capsule 400 mg  400 mg Oral TID Sanjuana Kava, NP      . magnesium hydroxide (MILK OF MAGNESIA) suspension 30 mL  30 mL Oral Daily PRN Kerry Hough, PA-C      . multivitamin with minerals tablet 1 tablet  1 tablet Oral Daily Kerry Hough, PA-C   1 tablet at 01/30/13 0820  . nicotine (NICODERM CQ - dosed in mg/24 hours) patch 21 mg  21 mg Transdermal Q0600 Rachael Fee, MD   21 mg at 01/30/13 7829  . sertraline (ZOLOFT) tablet 50 mg  50 mg Oral Daily Rachael Fee, MD   50 mg at 01/30/13 0819  . thiamine (  VITAMIN B-1) tablet 100 mg  100 mg Oral Daily Kerry Hough, PA-C   100 mg at 01/30/13 8416  . traZODone (DESYREL) tablet 150 mg  150 mg Oral QHS,MR X 1 Rachael Fee, MD   150 mg at 01/29/13 2208    Lab Results:  Results for orders placed during the hospital encounter of 01/26/13 (from the past 48 hour(s))  HEPATITIS PANEL, ACUTE     Status: Abnormal   Collection Time    01/28/13  7:44 PM      Result Value Range   Hepatitis B Surface Ag NEGATIVE  NEGATIVE   HCV Ab Reactive (*) NEGATIVE   Comment: (NOTE)                                                                               This test is for screening purposes only.  Reactive results  should be     confirmed by an alternative method.  Suggest HCV Qualitative, PCR,     test code 60630.  Specimens will be stable for reflex testing up to 3     days after collection.   Hep A IgM NEGATIVE  NEGATIVE   Hep B C IgM NEGATIVE  NEGATIVE   Comment: (NOTE)     High levels of Hepatitis B Core IgM antibody are detectable     during the acute stage of Hepatitis B. This antibody is used     to differentiate current from past HBV infection.  HIV ANTIBODY (ROUTINE TESTING)     Status: None   Collection Time    01/28/13  7:44 PM      Result Value Range   HIV NON REACTIVE  NON REACTIVE    Physical Findings: AIMS: Facial and Oral Movements Muscles of Facial Expression: None, normal Lips and Perioral Area: None, normal Jaw: None, normal Tongue: None, normal,Extremity Movements Upper (arms, wrists, hands, fingers): None, normal Lower (legs, knees, ankles, toes): None, normal, Trunk Movements Neck, shoulders, hips: None, normal, Overall Severity Severity of abnormal movements (highest score from questions above): None, normal Incapacitation due to abnormal movements: None, normal Patient's awareness of abnormal movements (rate only patient's report): No Awareness, Dental Status Current problems with teeth and/or dentures?: Yes Does patient usually wear dentures?: No  CIWA:  CIWA-Ar Total: 2 COWS:  COWS Total Score: 6  Treatment Plan Summary: Daily contact with patient to assess and evaluate symptoms and progress in treatment Medication management  Plan: Supportive approach/coping skills/relapse prevention. Increased Buspar from 7.5 mg 10 mg bid for anxiety and Neurontin from 300 mg to 400 mg tid for anxiety. Result for Hep C confirmation pending. Encouraged out of room, participation in group sessions and application of coping skills when distressed. Will continue to monitor response to/adverse effects of medications in use to assure effectiveness. Continue to monitor mood,  behavior and interaction with staff and other patients. Continue current plan of care.  Medical Decision Making Problem Points:  Review of psycho-social stressors (1) Data Points:  Review of medication regiment & side effects (2) Review of new medications or change in dosage (2)  I certify that inpatient services furnished can reasonably be expected to improve the patient's condition.  Armandina Stammer I 01/30/2013, 12:22 PM

## 2013-01-30 NOTE — Progress Notes (Signed)
Adult Psychoeducational Group Note  Date:  01/30/2013 Time:  2:25 PM  Group Topic/Focus:  Relapse Prevention Planning:   The focus of this group is to define relapse and discuss the need for planning to combat relapse.  Participation Level:  Minimal  Participation Quality:  Appropriate, Attentive and Resistant  Affect:  Anxious, Appropriate and Flat  Cognitive:  Appropriate  Insight: Appropriate and Improving  Engagement in Group:  Lacking and Resistant  Modes of Intervention:  Discussion and Education  Additional Comments:  Pt was somewhat engaged in group discussion but spent a majority of his attention on coloring, as pt stated that this is something that helps him when he is anxious. Pt did join group discussion, talking about past attempts at getting sober and clean. Pt discussed that reasons in the past for his change have been for himself and for his family, all of which have led him to relapse.    Dalia Heading 01/30/2013, 2:25 PM

## 2013-01-30 NOTE — Progress Notes (Signed)
Patient ID: [name] [name], male   DOB: Aug 26, 1976, 37 y.o.   MRN: [id number] CSW spoke with patient briefly late in the day.  Patient reports extremely difficult day, reports he and doctor both think it best he stay until he goes door to door for further treatment.  CSW supportive and shared date for [name] is 4/17. Carney Bern, LCSWA  01/30/2013

## 2013-01-30 NOTE — [facility] Group Notes (Cosign Needed)
[facility] LCSW Group Therapy  Relapse Prevention 1:15-2:15 01/30/2013    Type of Therapy: Group Therapy   Participation Level: Appropriate  Participation Quality: Attentive  Affect: Flat and Depressed  Cognitive: Appropriate   Insight: Improving   Engagement in Group: Limited  Engagement in Therapy: Limited  Modes of Intervention: Discussion, Education, Socialization and Support   Summary of Progress/Problems:The topic for today was feelings about relapse. Pt discussed what relapse prevention is to them and identified triggers that they are on the path to relapse. Pt processed their feeling towards relapse and was able to relate to peers. Pt discussed coping skills that can be used for relapse prevention. Pt participated in group discussion about putting yourself first in order to take better care of themselves. With this discussion, pt discussed learning to say no to others and self care. Pt discussed goals they had to get to where they want to be.  Pt stated that his reason for admission is that he is an alcoholic.  He shares that he is anxious to be released because his brother is ill and he needs to take care of him.  Pt reports that his plans for change include isolating himself from those that are negative influences.  Instead, pt plans to spend time with his family members that do not use substances.  Pt reports that he has gone to AA in the passed however, he did not find it to be helpful.  Leaner Morici L

## 2013-01-30 NOTE — Progress Notes (Signed)
D: Patient denies SI/HI and A/V hallucinations; patient reports sleep is well; reports appetite is improving ; reports energy level is high ; reports ability to pay attention is improving; rates depression as 5/10; rates hopelessness 4/10; rates anxiety as 8/10;   A: Monitored q 15 minutes; patient encouraged to attend groups; patient educated about medications; patient given medications per physician orders; patient encouraged to express feelings and/or concerns  R: Patient is anxious but cooperative and patient verbalizes that he tries to use his coping skills; patient's interaction with staff and peers is appropriate;  patient is taking medications as prescribed and tolerating medications; patient is attending all groups; patient is brighter on the unit and able to talk about coping skills learned but still complains of anxiety

## 2013-01-31 DIAGNOSIS — F0630 Mood disorder due to known physiological condition, unspecified: Secondary | ICD-10-CM | POA: Diagnosis present

## 2013-01-31 DIAGNOSIS — F411 Generalized anxiety disorder: Secondary | ICD-10-CM | POA: Diagnosis present

## 2013-01-31 LAB — HEPATITIS C VRS RNA DETECT BY PCR-QUAL: Hepatitis C Vrs RNA by PCR-Qual: POSITIVE — AB

## 2013-01-31 MED ORDER — GABAPENTIN 300 MG PO CAPS
600.0000 mg | ORAL_CAPSULE | Freq: Three times a day (TID) | ORAL | Status: DC
  Administered 2013-02-01 – 2013-02-04 (×12): 600 mg via ORAL
  Filled 2013-01-31 (×18): qty 2

## 2013-01-31 MED ORDER — SERTRALINE HCL 50 MG PO TABS
75.0000 mg | ORAL_TABLET | Freq: Every day | ORAL | Status: DC
  Administered 2013-02-01 – 2013-02-03 (×3): 75 mg via ORAL
  Filled 2013-01-31 (×4): qty 1

## 2013-01-31 NOTE — Progress Notes (Signed)
[facility] MD Progress Note  01/31/2013 4:57 PM [name] [name] [name]  MRN:  [id number] Subjective:  States that the Seroquel is helping to calm him down. States that he gets in moods where he wants to hurt himself or other people. Yesterday before the Seroquel feels he was there. He was also wanting to use. He had using dreams of Methamphetamine. Worried, concerned that his mood is not stable. States that he really wants to abstain. Diagnosis:  Polysubstance Dependence, Anxiety Disorder, Mood Disorder NOS  ADL's:  Intact  Sleep: Fair  Appetite:  Fair  Suicidal Ideation:  Plan:  denies Intent:  denies Means:  denies Homicidal Ideation:  Plan:  denies Intent:  denies Means:  denies AEB (as evidenced by):  Psychiatric Specialty Exam: Review of Systems  Constitutional: Negative.   HENT: Negative.   Eyes: Negative.   Respiratory: Negative.   Cardiovascular: Negative.   Gastrointestinal: Negative.   Genitourinary: Negative.   Musculoskeletal: Negative.   Skin: Negative.   Neurological: Negative.   Endo/Heme/Allergies: Negative.   Psychiatric/Behavioral: Positive for depression and substance abuse. The patient is nervous/anxious and has insomnia.     Blood pressure 106/73, pulse 112, temperature 98.7 F (37.1 C), temperature source Oral, resp. rate 18, height 5\' 8"  (1.727 m), weight 83.008 kg (183 lb).Body mass index is 27.83 kg/(m^2).  General Appearance: Disheveled  Eye Solicitor::  Fair  Speech:  Clear and Coherent and Slow  Volume:  Decreased  Mood:  Anxious, Depressed and worried (craving)  Affect:  Restricted  Thought Process:  Coherent and Goal Directed  Orientation:  Full (Time, Place, and Person)  Thought Content:  worries, concerns  Suicidal Thoughts:  No  Homicidal Thoughts:  No  Memory:  Immediate;   Fair Recent;   Fair Remote;   Fair  Judgement:  Fair  Insight:  Shallow  Psychomotor Activity:  Restlessness  Concentration:  Fair  Recall:  Fair  Akathisia:  No  Handed:   Right  AIMS (if indicated):     Assets:  Desire for Improvement  Sleep:  Number of Hours: 5.75   Current Medications: Current Facility-Administered Medications  Medication Dose Route Frequency Provider Last Rate Last Dose  . acetaminophen (TYLENOL) tablet 650 mg  650 mg Oral Q6H PRN Kerry Hough, PA-C   650 mg at 01/30/13 0651  . alum & mag hydroxide-simeth (MAALOX/MYLANTA) 200-200-20 MG/5ML suspension 30 mL  30 mL Oral Q4H PRN Kerry Hough, PA-C      . busPIRone (BUSPAR) tablet 10 mg  10 mg Oral BID Sanjuana Kava, NP   10 mg at 01/31/13 0825  . gabapentin (NEURONTIN) capsule 400 mg  400 mg Oral TID Sanjuana Kava, NP   400 mg at 01/31/13 1150  . hydrOXYzine (ATARAX/VISTARIL) tablet 25 mg  25 mg Oral Q6H PRN Sanjuana Kava, NP      . magnesium hydroxide (MILK OF MAGNESIA) suspension 30 mL  30 mL Oral Daily PRN Kerry Hough, PA-C   30 mL at 01/31/13 1113  . multivitamin with minerals tablet 1 tablet  1 tablet Oral Daily Kerry Hough, PA-C   1 tablet at 01/31/13 0825  . nicotine (NICODERM CQ - dosed in mg/24 hours) patch 21 mg  21 mg Transdermal Q0600 Rachael Fee, MD   21 mg at 01/31/13 0737  . QUEtiapine (SEROQUEL) tablet 50 mg  50 mg Oral TID PRN Rachael Fee, MD   50 mg at 01/31/13 0824  . sertraline (ZOLOFT) tablet  50 mg  50 mg Oral Daily Rachael Fee, MD   50 mg at 01/31/13 0825  . thiamine (VITAMIN B-1) tablet 100 mg  100 mg Oral Daily Kerry Hough, PA-C   100 mg at 01/31/13 4132  . traZODone (DESYREL) tablet 150 mg  150 mg Oral QHS,MR X 1 Rachael Fee, MD   150 mg at 01/30/13 2203    Lab Results:  Results for orders placed during the hospital encounter of 01/26/13 (from the past 48 hour(s))  HEPATITIS C VRS RNA DETECT BY PCR-QUAL     Status: Abnormal   Collection Time    01/29/13  7:34 PM      Result Value Range   Hepatitis C Vrs RNA by PCR-Qual POSITIVE (*) Negative   Comment: (NOTE)     The lower limit of detection for Hepatitis C RNA by PCR is 15 IU/mL.      The performance characteristics of the Roche TaqMan HCV test were         determined by [facility]. It has not been cleared or     approved by the U.S. Food and Drug Administration (FDA). The FDA has     determined that such clearance or approval is not necessary. This test     is used for clinical purposes. It should not be regarded as     investigational or for research. This laboratory is certified under     the Clinical Laboratory Improvement Amendments of 1988 (CLIA-88) as     qualified to perform high complexity testing    Physical Findings: AIMS: Facial and Oral Movements Muscles of Facial Expression: None, normal Lips and Perioral Area: None, normal Jaw: None, normal Tongue: None, normal,Extremity Movements Upper (arms, wrists, hands, fingers): None, normal Lower (legs, knees, ankles, toes): None, normal, Trunk Movements Neck, shoulders, hips: None, normal, Overall Severity Severity of abnormal movements (highest score from questions above): None, normal Incapacitation due to abnormal movements: None, normal Patient's awareness of abnormal movements (rate only patient's report): No Awareness, Dental Status Current problems with teeth and/or dentures?: Yes Does patient usually wear dentures?: No  CIWA:  CIWA-Ar Total: 0 COWS:  COWS Total Score: 6  Treatment Plan Summary: Daily contact with patient to assess and evaluate symptoms and progress in treatment Medication management  Plan: Supportive approach/coping skills/relapse prevention           Continue to optimize treatment with Zoloft/Buspar/Seroquel  Medical Decision Making Problem Points:  Review of psycho-social stressors (1) Data Points:  Review of medication regiment & side effects (2)  [name] [name] certify that inpatient services furnished can reasonably be expected to improve the patient's condition.   [name] [name] [name] A 01/31/2013, 4:57 PM

## 2013-01-31 NOTE — Progress Notes (Signed)
Patient ID: [name] [name], male   DOB: 05-25-1976, 37 y.o.   MRN: [id number] D)  Seemed to be having a better evening, was less anxious tonight, stated trying to deal with his anger issues and not let himself react to things.  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 Denies thoughts of harming self or others tonight.  Attended group, came to med window for hs meds.  Interacting appropriately with peers and staff tonight. A)  Will continue to monitor for safety, continue POC, support. R)  Safety maintained.

## 2013-01-31 NOTE — [facility] Group Notes (Signed)
[facility] LCSW Group Therapy  01/31/2013 10:00-11:00AM  Summary of Progress/Problems:  In group, the patients discussed ways in which they have sabotaged their own recovery.  Motivational Interviewing was utilized to ask group members what "benefits" they are looking for when they use substances, and what they want to change.  There was an excellent discussion about taking personal responsibility for all one's care of all aspects of the dual diagnosis they face, and for having knowledge of illnesses, symptoms, medications, and not just relying on doctors to prescribe "solutions."  There was also an emphasis on changing for self, not for others.  The patient expressed that when he relapses, he is already thinking in advance that this confirms what everyone thinks of him as a convict, addict, and that he then does not have to change into someone else, does not have to be responsible.  He states that he could have been in jail or at the hospital, and is so grateful for getting actual help for his problems.  Type of Therapy:  Group Therapy  Participation Level:  Active  Participation Quality:  Appropriate, Attentive, Sharing and Supportive  Affect:  Appropriate and Blunted  Cognitive:  Alert, Appropriate and Oriented  Insight:  Engaged  Engagement in Therapy:  Engaged  Modes of Intervention:  Discussion, Exploration and Support  Sarina Ser 01/31/2013, 11:09 AM

## 2013-01-31 NOTE — Progress Notes (Signed)
Psychoeducational Group Note  Date:  01/31/2013 Time:  0945 am  Group Topic/Focus:  Identifying Needs:   The focus of this group is to help patients identify their personal needs that have been historically problematic and identify healthy behaviors to address their needs.  Participation Level:  Active  Participation Quality:  Appropriate  Affect:  Appropriate  Cognitive:  Alert  Insight:  Developing/Improving  Engagement in Group:  Developing/Improving  Additional Comments:    Andrena Mews 01/31/2013,2:12 PM

## 2013-01-31 NOTE — Progress Notes (Signed)
Adult Psychoeducational Group Note  Date:  01/31/2013 Time:  0900  Group Topic/Focus:  Self inventory sheet  Participation Level:  Active  Participation Quality:  Appropriate  Affect:  Appropriate  Cognitive:  Appropriate  Insight: Appropriate  Engagement in Group:  Engaged, Improving and Supportive  Modes of Intervention:  Clarification  Additional Comments:  enganged in group adding to group Roselee Culver 01/31/2013, 12:15 PM

## 2013-01-31 NOTE — Progress Notes (Signed)
D patient states he slept well last nite w/no problems, his appetite is improving and he goes to the Dr for meals, His energy level is high and his ability to pay attention is imporving, his depression is 3/10 and his hopelessness is 3/10 today, he continues w/WD s/s including tremors, cravings and agitation, denies Si or Hi and no SVH at this time, his pain goal today is 0/10, he is taking his meds as ordered by MD and attending group. A q34min safety checks continue and support offered, encouraged to continue attending group and participating to relieve some agitation and aggressiveness R patient remains safe on the unit
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
## 2013-02-01 MED ORDER — SULFAMETHOXAZOLE-TMP DS 800-160 MG PO TABS
1.0000 | ORAL_TABLET | Freq: Two times a day (BID) | ORAL | Status: DC
  Administered 2013-02-01 – 2013-02-04 (×8): 1 via ORAL
  Filled 2013-02-01 (×11): qty 1

## 2013-02-01 NOTE — Progress Notes (Signed)
[facility] Group Notes:  (Nursing/MHT/Case Management/Adjunct)  Date:  02/01/2013  Time:  10:22 AM  Type of Therapy:  Therapeutic Activity  Participation Level:  Active  Participation Quality:  Intrusive, Redirectable and Resistant  Affect:  Flat and Irritable  Cognitive:  Alert  Insight:  Good  Engagement in Group:  Developing/Improving and Distracting  Modes of Intervention:  Activity  Summary of Progress/Problems: Pt participated in group where pts played "Would you rather". Pt was engaged in group activity but distracting all throughout group, talking out of turn and needed to be redirected several times. Pt did participate and at the end of group pt appeared to understand the purpose of group as he stated "This was a distraction away from our problems. I had some fun while I was in here."   Dalia Heading 02/01/2013, 10:22 AM

## 2013-02-01 NOTE — Progress Notes (Signed)
[facility] Group Notes:  (Nursing/MHT/Case Management/Adjunct)  Date:  01/31/2013  Time:  2100  Type of Therapy:  wrap up group  Participation Level:  Active  Participation Quality:  Appropriate, Attentive and Sharing  Affect:  Anxious, Appropriate and Irritable  Cognitive:  Alert and Appropriate  Insight:  Good  Engagement in Group:  Distracting, Engaged and Supportive  Modes of Intervention:  Clarification, Education and Support  Summary of Progress/Problems:Pt reported being able to "talk things out" today with others in recovery was good for him. Pt was nervous about sharing so he volunteered to "get it out of the way".  Pt was supportive to others in group by sharing helpful perspectives and skills from his previous recovery times but then be a distraction by  restlessly walking around or making jokes while others were speaking.  Shelah Lewandowsky 02/01/2013, 2:55 AM

## 2013-02-01 NOTE — Progress Notes (Signed)
Psychoeducational Group Note  Date:  02/01/2013 Time:  0945 am  Group Topic/Focus:  Making Healthy Choices:   The focus of this group is to help patients identify negative/unhealthy choices they were using prior to admission and identify positive/healthier coping strategies to replace them upon discharge.  Participation Level:  Did Not Attend   Miranda Garber J 02/01/2013, 10:29 AM 

## 2013-02-01 NOTE — Progress Notes (Signed)
Adult Psychoeducational Group Note  Date:  02/01/2013 Time:  0900  Group Topic/Focus:  Self inventory sheet  Participation Level:  Active  Participation Quality:  Appropriate  Affect:  Anxious  Cognitive:  Appropriate  Insight: Lacking  Engagement in Group:  Defensive  Modes of Intervention:  social Additional Comments:  Patient upset another patient got his seat when he left the room, left and went back to his room on the unit  Roselee Culver 02/01/2013, 10:25 AM

## 2013-02-01 NOTE — Progress Notes (Signed)
D patient slept poorly last nite d/t worrying about his issues, appetite is improving, energy level is hyper and ability to pay attention is improving, depressed 3/10 and hopeless 4/10 today, WD s/s include tremors, cravings, agitation, denies Si or HI and no avh, states he needs something for a sinus infection, seen by MD and begun on ABT, taking meds as ordered by MD, attending group and participating somewhat, states he wants to stay clean and hang out w/his friends in recovery, very serious about his detox this time after many years of drugging. A q54min safety checks continue and support offered, encouraged to stay in group and participate without getting upset over the "little things" R patient remains safe on the unit
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
## 2013-02-01 NOTE — B[facility] Group Notes (Signed)
[facility] LCSW Group Therapy  02/01/2013 10:00-11:00am  Summary of Progress/Problems:  The main focus of today's process group was to consider whether and how to increase/improve support system.  Four definitions of support were discussed and an exercise was utilized to show how much stronger we become with additional supports providing accountability, improved physical and emotional health, and better problem-solving skills.  An emphasis was placed on using counselor, doctor, therapy groups, 12-step groups, and problem-specific support groups to expand supports. The patient expressed irritability several times then left the room saying that he could not handle it right then.  Type of Therapy:  Group Therapy  Participation Level:  Minimal  Participation Quality:  Resistant  Affect:  Blunted and Irritable  Cognitive:  Oriented  Insight:  Limited  Engagement in Therapy:  Limited  Modes of Intervention:  Exploration and Motivational Interviewing  Sarina Ser 02/01/2013, 12:40 PM

## 2013-02-02 DIAGNOSIS — F1910 Other psychoactive substance abuse, uncomplicated: Secondary | ICD-10-CM

## 2013-02-02 MED ORDER — HYDROXYZINE HCL 50 MG PO TABS
50.0000 mg | ORAL_TABLET | Freq: Four times a day (QID) | ORAL | Status: DC | PRN
  Administered 2013-02-02 – 2013-02-04 (×4): 50 mg via ORAL
  Filled 2013-02-02: qty 1

## 2013-02-02 MED ORDER — LORATADINE 10 MG PO TABS
10.0000 mg | ORAL_TABLET | Freq: Every day | ORAL | Status: DC
  Administered 2013-02-02 – 2013-02-04 (×3): 10 mg via ORAL
  Filled 2013-02-02 (×4): qty 1
  Filled 2013-02-02: qty 14
  Filled 2013-02-02 (×2): qty 1

## 2013-02-02 MED ORDER — BUSPIRONE HCL 10 MG PO TABS
15.0000 mg | ORAL_TABLET | Freq: Two times a day (BID) | ORAL | Status: DC
  Administered 2013-02-02 – 2013-02-04 (×5): 15 mg via ORAL
  Filled 2013-02-02 (×2): qty 42
  Filled 2013-02-02 (×8): qty 1

## 2013-02-02 MED ORDER — LORATADINE 5 MG/5ML PO SYRP: 10.0000 mg | ORAL_SOLUTION | Freq: Every day | ORAL | Status: DC

## 2013-02-02 NOTE — Care Management Utilization Note (Signed)
PER STATE REGULATIONS 482.30  THIS CHART WAS REVIEWED FOR MEDICAL NECESSITY WITH RESPECT TO THE PATIENT'S ADMISSION/ DURATION OF STAY.  NEXT REVIEW DATE: 02/05/13  

## 2013-02-02 NOTE — Progress Notes (Signed)
Pt has been in and out of groups today.  He had an outburst of anger this morning in the first group and came out cursing demanding to leave.  Pt stated, " open the f.....g door so I can get the f..k out of here"  Pt then stated,"I don't want to leave I need to be here" Pt did calm down and was apologetic for his out burst.  He did have some prn vistaril and Seroquel today with positive results.  He did do better but was demanding again at the window at 1700 medication pass demanding vistaril that he stated,"that nurse woman told me she promised me I could have vistaril".  Pt did have that order but continued to ask at the window 3 times before I could get the medication to him. He did come back and apologized again for his cursing and behavior.  He denied any S/H ideation or A/V hallucinations.

## 2013-02-02 NOTE — [facility] Group Notes (Signed)
[facility] LCSW Aftercare Discharge Planning Group Note   02/02/2013 8:45 AM  Participation Quality:  Inappropriate  Mood/Affect:  Irritabile; patient left group before checking in with CSW due to his own irritability  Jnae Thomaston, Julious Payer

## 2013-02-02 NOTE — Progress Notes (Signed)
Patient did not attend the evening speaker AA meeting. Pt reported being too upset to go. Pt was anxious, irritable, and distraught. "I'm about to loose my f'in mind". Pt verbally deescalated and support given. Pt attempted to attend group but stayed only ten minutes. Pt allowed to walk hall.

## 2013-02-02 NOTE — Tx Team (Signed)
Interdisciplinary Treatment Plan Update (Adult)  Date: 02/02/2013  Time Reviewed: 10:05 AM   Progress in Treatment: Attending groups: Yes Participating in groups: Yes Taking medication as prescribed:  Yes Tolerating medication:  Yes Family/Significant othe contact made: No Patient understands diagnosis: Yes Discussing patient identified problems/goals with staff: Yes Medical problems stabilized or resolved:  Yes Denies suicidal/homicidal ideation: Yes Patient has not harmed self or Others: Yes  New problem(s) identified: None Identified  Discharge Plan or Barriers:  Patient has date for treatment bed at [facility] on 4/17  Additional comments: N/A  Reason for Continuation of Hospitalization: Anxiety Depression Medication stabilization Withdrawal symptoms   Estimated length of stay: 3 days  For review of initial/current patient goals, please see plan of care.  Attendees: Patient:     Family:     Physician:  02/02/2013 10:05 AM   Nursing:   Robbie Louis, RN 02/02/2013 10:05 AM   Clinical Social Worker Ronda Fairly 02/02/2013 10:05 AM   Other:   Dellia Cloud, RN 02/02/2013 10:05 AM   Other:  Margorie John PMBNP-BH 02/02/2013 10:05 AM   Other:  Alla German PA Student 02/02/2013 10:05 AM   Other:  Liliane Bade, Transitional Care Coordinator 02/02/2013 10:05 AM  Other:  [place], MA [employer] Psych Intern  02/02/2013 10:05 AM    Scribe for Treatment Team:   Carney Bern, LCSWA  02/02/2013 10:05 AM

## 2013-02-02 NOTE — [facility] Group Notes (Signed)
[facility] LCSW Group Therapy  02/02/2013 1:15 PM  Type of Therapy:  Group Therapy 1:15 to 2:30 PM  Participation Level: Adequate  Participation Quality: Intrussive  Affect: Irritabile  Cognitive:  Oriented  Insight: Limited  Engagement in Therapy:  None, patient focused on his own side comments  Modes of Intervention:  Limit setting, support  Summary of Progress/Problems:  [name] was present for majority of group discussion focused on overcoming obstacles.  [name] was able to process his feelings about his high liver count and shared some of his fears about relapse when CSW would focus questions to him.  [name] was otherwise unable to restrict his comments when others shared; comments were intrusive and sometimes unrelated to discussion.   [name] [name]

## 2013-02-02 NOTE — Progress Notes (Signed)
Adult Psychoeducational Group Note  Date:  02/02/2013 Time:  11:00 AM  Group Topic/Focus:  Dimensions of Wellness:   The focus of this group is to introduce the topic of wellness and discuss the role each dimension of wellness plays in total health.  Participation Level:  Active  Participation Quality:  Appropriate and Attentive  Affect:  Appropriate  Cognitive:  Alert and Appropriate  Insight: Limited  Engagement in Group:  Engaged  Modes of Intervention:  Activity and Discussion  Additional Comments:  PT is motivated to stay away from alcohol and drugs. He was attentive during group and contributed to the conversation.  Merikay Lesniewski T 02/02/2013, 11:00 AM

## 2013-02-02 NOTE — Progress Notes (Signed)
Patient ID: [name] [name], male   DOB: [birth year], 37 y.o.   MRN: [id number] D)  Came to the med window shortly after the shift started, was feeling irritable and anxious, the hall has been loud and busy with visitors and he was feeling somewhat agitated.  Requested and was given seroquel.  Was able to attend group afterward.  Came to med window again later for hs meds and talked about his children.  Wants to get himself straightened out for them.  Stated he always called his daughter before he would use because he didn't want her to call him when he was high and not making sense. Stated his son is 42 and in jail in [place] for drug related charges, realizes he hasn't been a good role model, and is concerned about his liver. A)  Will continue to try to develop therapeutic rapport, continue to monitor for safety, POC R)  Safety maintained, gaining some insight.

## 2013-02-02 NOTE — Progress Notes (Signed)
Patient ID: [name] [name], male   DOB: 04-19-1976, 37 y.o.   MRN: [id number] [facility] MD Progress Note  02/02/2013 3:37 PM [name] [name]  MRN:  [id number]  Subjective:  [name] [name] endorses that he continues to have a hard time. He reports, "I'm really agitated and freaking out. I feel like biting people's head off.I have been feeling like this since Friday. I'm worried about how I'm going to pay for my medicines when I get discharged from here. I will be leaving for the treatment center on Thursday. I feel congested. Just having a hard time".  ADL's:  Intact  Sleep: 6.5 per documentation.  Appetite:  Fair  Suicidal Ideation:  Plan:  denies Intent:  denies Means:  denies  Homicidal Ideation:  Plan:  denies Intent:  denies Means:  denies  AEB (as evidenced by): per patient's reports  Psychiatric Specialty Exam: Review of Systems  Constitutional: Negative.   HENT: Negative.   Eyes: Negative.   Respiratory: Negative.   Cardiovascular: Negative.   Gastrointestinal: Negative.   Genitourinary: Negative.   Musculoskeletal: Negative.   Skin: Negative.   Neurological: Negative.   Endo/Heme/Allergies: Negative.   Psychiatric/Behavioral: Positive for depression and substance abuse. The patient is nervous/anxious and has insomnia.     Blood pressure 117/74, pulse 87, temperature 98.2 F (36.8 C), temperature source Oral, resp. rate 16, height 5\' 8"  (1.727 m), weight 83.008 kg (183 lb).Body mass index is 27.83 kg/(m^2).  General Appearance: Fairly Groomed  Patent attorney::  Fair  Speech:  Clear and Coherent and Slow  Volume:  Decreased  Mood:  Anxious, Depressed and worried  Affect:  worried  Thought Process:  Coherent and Goal Directed  Orientation:  Full (Time, Place, and Person)  Thought Content:  worries and concerns, shame and guilt, fear of relapsing, letting people down  Suicidal Thoughts:  No  Homicidal Thoughts:  No  Memory:  Immediate;   Fair Recent;   Fair Remote;   Fair   Judgement:  Fair  Insight:  Present  Psychomotor Activity:  Restlessness  Concentration:  Fair  Recall:  Fair  Akathisia:  No  Handed:  Right  AIMS (if indicated):     Assets:  Desire for Improvement  Sleep:  Number of Hours: 6.25   Current Medications: Current Facility-Administered Medications  Medication Dose Route Frequency Provider Last Rate Last Dose  . acetaminophen (TYLENOL) tablet 650 mg  650 mg Oral Q6H PRN Kerry Hough, PA-C   650 mg at 02/01/13 1605  . alum & mag hydroxide-simeth (MAALOX/MYLANTA) 200-200-20 MG/5ML suspension 30 mL  30 mL Oral Q4H PRN Kerry Hough, PA-C      . busPIRone (BUSPAR) tablet 15 mg  15 mg Oral BID Sanjuana Kava, NP      . gabapentin (NEURONTIN) capsule 600 mg  600 mg Oral TID Rachael Fee, MD   600 mg at 02/02/13 1259  . hydrOXYzine (ATARAX/VISTARIL) tablet 50 mg  50 mg Oral Q6H PRN Sanjuana Kava, NP      . loratadine (CLARITIN) 5 MG/5ML syrup 10 mg  10 mg Oral Daily Sanjuana Kava, NP      . magnesium hydroxide (MILK OF MAGNESIA) suspension 30 mL  30 mL Oral Daily PRN Kerry Hough, PA-C   30 mL at 01/31/13 2221  . multivitamin with minerals tablet 1 tablet  1 tablet Oral Daily Kerry Hough, PA-C   1 tablet at 02/02/13 0804  . nicotine (NICODERM CQ - dosed  in mg/24 hours) patch 21 mg  21 mg Transdermal Q0600 Rachael Fee, MD   21 mg at 02/02/13 1610  . QUEtiapine (SEROQUEL) tablet 50 mg  50 mg Oral TID PRN Rachael Fee, MD   50 mg at 02/02/13 0946  . sertraline (ZOLOFT) tablet 75 mg  75 mg Oral Daily Rachael Fee, MD   75 mg at 02/02/13 0804  . sulfamethoxazole-trimethoprim (BACTRIM DS) 800-160 MG per tablet 1 tablet  1 tablet Oral Q12H Rachael Fee, MD   1 tablet at 02/02/13 0804  . thiamine (VITAMIN B-1) tablet 100 mg  100 mg Oral Daily Kerry Hough, PA-C   100 mg at 02/02/13 0804  . traZODone (DESYREL) tablet 150 mg  150 mg Oral QHS,MR X 1 Rachael Fee, MD   150 mg at 02/01/13 2153    Lab Results: No results found for this or  any previous visit (from the past 48 hour(s)).  Physical Findings: AIMS: Facial and Oral Movements Muscles of Facial Expression: None, normal Lips and Perioral Area: None, normal Jaw: None, normal Tongue: None, normal,Extremity Movements Upper (arms, wrists, hands, fingers): None, normal Lower (legs, knees, ankles, toes): None, normal, Trunk Movements Neck, shoulders, hips: None, normal, Overall Severity Severity of abnormal movements (highest score from questions above): None, normal Incapacitation due to abnormal movements: None, normal Patient's awareness of abnormal movements (rate only patient's report): No Awareness, Dental Status Current problems with teeth and/or dentures?: Yes Does patient usually wear dentures?: No  CIWA:  CIWA-Ar Total: 0 COWS:  COWS Total Score: 6  Treatment Plan Summary: Daily contact with patient to assess and evaluate symptoms and progress in treatment Medication management  Plan: Supportive approach/coping skills/relapse prevention Continue Neurontin/Seroquel/Zoloft. Increased Buspar from 10 mg to 15 mg bid for anxiety. Increased Hydroxyzine from 25 mg to 50 mg PRN for anxiety. Initiate Claritin 10 mg daily for allergies. Continue current treatment plan.  Medical Decision Making Problem Points:  Review of psycho-social stressors (1) Data Points:  Review of medication regiment & side effects (2)  I certify that inpatient services furnished can reasonably be expected to improve the patient's condition.   Armandina Stammer I 02/02/2013, 3:37 PM

## 2013-02-03 MED ORDER — QUETIAPINE FUMARATE 50 MG PO TABS: 50.0000 mg | ORAL_TABLET | Freq: Three times a day (TID) | ORAL | Status: DC

## 2013-02-03 MED ORDER — QUETIAPINE FUMARATE 50 MG PO TABS
50.0000 mg | ORAL_TABLET | Freq: Three times a day (TID) | ORAL | Status: DC
  Filled 2013-02-03 (×4): qty 1

## 2013-02-03 MED ORDER — QUETIAPINE FUMARATE 50 MG PO TABS
50.0000 mg | ORAL_TABLET | ORAL | Status: DC
  Administered 2013-02-03 – 2013-02-04 (×5): 50 mg via ORAL
  Filled 2013-02-03: qty 42
  Filled 2013-02-03 (×6): qty 1
  Filled 2013-02-03: qty 42
  Filled 2013-02-03: qty 1
  Filled 2013-02-03: qty 42
  Filled 2013-02-03 (×2): qty 1

## 2013-02-03 MED ORDER — SERTRALINE HCL 100 MG PO TABS
100.0000 mg | ORAL_TABLET | Freq: Every day | ORAL | Status: DC
  Administered 2013-02-04: 100 mg via ORAL
  Filled 2013-02-03 (×3): qty 1
  Filled 2013-02-03: qty 14

## 2013-02-03 MED ORDER — MAGNESIUM CITRATE PO SOLN
1.0000 | Freq: Once | ORAL | Status: AC
  Administered 2013-02-03: 1 via ORAL

## 2013-02-03 NOTE — Progress Notes (Signed)
Recreation Therapy Notes  Date: 04.15.2014  Time: 3:00pm  Location: [address] Day Room   Group Topic/Focus: Decision Making & Goal Setting   Participation Level:  Did not attend  Jearl Klinefelter, LRT/CTRS  Jearl Klinefelter 02/03/2013 3:57 PM

## 2013-02-03 NOTE — Progress Notes (Signed)
[facility] LCSW Group Therapy  02/03/2013 1:15 PM  Type of Therapy:  Group Therapy 1:15 to 2:30PM  Participation Level:  Minimal  Participation Quality:  Inattentive  Affect:  Irritable  Cognitive:  Oriented  Insight:  Limited  Engagement in Therapy:  Poor, patient came into group room well into session and remained for less than 15 minutes  Modes of Intervention:  Discussion, Education and Support  Summary of Progress/Problems: Patient attended group presentation by staff member of  [facility] [facility] [employer] [facility] [employer] ([facility]). [name] [name] was only present for a shor while in group and while there was inattentive.  Clide Dales

## 2013-02-03 NOTE — Progress Notes (Signed)
[facility] LCSW Aftercare Discharge Planning Group Note   02/03/2013 8:45 AM   Participation Quality:  Minimal  Mood/Affect:  Labile  Depression Rating:  None given as patient left group  Anxiety Rating:  None given as patient left group  Thoughts of Suicide:  No Will you contract for safety?   NA  Current AVH:  No  Plan for Discharge/Comments:  Patient has treatment bed at  [facility] on 02/05/13  Transportation Means: Friends  Supports: AA community friends  Cashion, Julious Payer

## 2013-02-03 NOTE — Progress Notes (Signed)
Pt reports he is feeling anxious today.  He is worried about his 35 yo son who is in legal trouble and is a drug addict.  He blames himself for not being the father and role model he should have been for his son when he was growing up.  He says he feels on edge all the time himself and finds himself getting easily agitated with people, especially other patients who don't seem to be vested in getting better.  Encouraged pt to just focus on his treatment and just remove himself from others that upset him.  Suggested that he come to staff immediately when he gets upset with peers to talk about his frustrations rather that letting it fester to the point of becoming angry.  Support and encouragement offered.  Pt denies SI/HI/AV.  He has an appt with [name] on 4/17.  Pt is med compliant.  Safety maintained with q15 minute checks.

## 2013-02-03 NOTE — Progress Notes (Signed)
D: Patient denies SI/HI and A/V hallucinations; patient reports sleep is well; reports appetite is improving; reports energy level is hyper ; reports ability to pay attention is improving; rates depression as 2/10; rates hopelessness 2/10; constant complaints of anxiety  A: Monitored q 15 minutes; patient encouraged to attend groups; patient educated about medications; patient given medications per physician orders; patient encouraged to express feelings and/or concerns  R: Patient reports a decrease in anxiety; patient is cooperative and appropriate to circumstance otherwise; patient's interaction with staff and peers is appropriate;; patient is taking medications as prescribed and tolerating medications; patient is attending all groups

## 2013-02-03 NOTE — Progress Notes (Signed)
Patient reported that he has not had a bowel movement since 01/30/13 even though its documented his last bowel movement was 02/02/13; patient reported otherwise to nurse and physician

## 2013-02-03 NOTE — Progress Notes (Signed)
Pt came to the med window c/o anxiety and wanting to know if he had anything available.  Pt was given prn Seroquel at this time.  Pt denies SI/HI/AV.  He seems restless and fidgety.  He has been loud in his interaction with his peers.  He says he has an appt at [facility] on Thursday.  Support and encouragement offered.  Pt makes his needs known to staff.  Safety maintained with q15 minute checks.

## 2013-02-03 NOTE — Progress Notes (Signed)
[facility] MD Progress Note  02/03/2013 6:08 PM [name] [name]  MRN:  [id number] Subjective:  Still with mood instability. He expresses understanding that he is withdrawing from all these drugs and that he is going to have symptoms that will linger. He is looking forward to going to rehab as this is going to give him more time to set stable. States that he knows he needs to focus on his recovery. He will abstain from getting involved in relationships, as states that "women" are [name] drug for him Diagnosis:  Polysubstance Dependence  ADL's:  Intact  Sleep: Fair  Appetite:  Fair  Suicidal Ideation:  Plan:  denies Intent:  denies Means:  denies Homicidal Ideation:  Plan:  denies Intent:  denies Means:  denies AEB (as evidenced by):  Psychiatric Specialty Exam: Review of Systems  Psychiatric/Behavioral: Positive for substance abuse. The patient is nervous/anxious.     Blood pressure 103/71, pulse 102, temperature 97.5 F (36.4 C), temperature source Oral, resp. rate 20, height 5\' 8"  (1.727 m), weight 83.008 kg (183 lb).Body mass index is 27.83 kg/(m^2).  General Appearance: Fairly Groomed  Patent attorney::  Fair  Speech:  Clear, coherent, slow  Volume:  fluctuates  Mood:  Anxious and worried, at times irritable  Affect:  Restricted  Thought Process:  Coherent and Goal Directed  Orientation:  Full (Time, Place, and Person)  Thought Content:  worries, concerns, symptoms  Suicidal Thoughts:  No  Homicidal Thoughts:  No  Memory:  Immediate;   Fair Recent;   Fair Remote;   Fair  Judgement:  Fair  Insight:  Present  Psychomotor Activity:  Restlessness  Concentration:  Fair  Recall:  Fair  Akathisia:  No  Handed:  Right  AIMS (if indicated):     Assets:  Desire for Improvement  Sleep:  Number of Hours: 5.5   Current Medications: Current Facility-Administered Medications  Medication Dose Route Frequency Provider Last Rate Last Dose  . acetaminophen (TYLENOL) tablet 650 mg  650 mg Oral  Q6H PRN Kerry Hough, PA-C   650 mg at 02/01/13 1605  . alum & mag hydroxide-simeth (MAALOX/MYLANTA) 200-200-20 MG/5ML suspension 30 mL  30 mL Oral Q4H PRN Kerry Hough, PA-C      . busPIRone (BUSPAR) tablet 15 mg  15 mg Oral BID Sanjuana Kava, NP   15 mg at 02/03/13 1712  . gabapentin (NEURONTIN) capsule 600 mg  600 mg Oral TID Rachael Fee, MD   600 mg at 02/03/13 1712  . hydrOXYzine (ATARAX/VISTARIL) tablet 50 mg  50 mg Oral Q6H PRN Sanjuana Kava, NP   50 mg at 02/03/13 0646  . loratadine (CLARITIN) tablet 10 mg  10 mg Oral Daily Sanjuana Kava, NP   10 mg at 02/03/13 4098  . magnesium hydroxide (MILK OF MAGNESIA) suspension 30 mL  30 mL Oral Daily PRN Kerry Hough, PA-C   30 mL at 01/31/13 2221  . multivitamin with minerals tablet 1 tablet  1 tablet Oral Daily Kerry Hough, PA-C   1 tablet at 02/03/13 0809  . nicotine (NICODERM CQ - dosed in mg/24 hours) patch 21 mg  21 mg Transdermal Q0600 Rachael Fee, MD   21 mg at 02/03/13 0604  . QUEtiapine (SEROQUEL) tablet 50 mg  50 mg Oral TID PRN Rachael Fee, MD   50 mg at 02/03/13 1191  . QUEtiapine (SEROQUEL) tablet 50 mg  50 mg Oral [id number] Rachael Fee, MD  50 mg at 02/03/13 1443  . [START ON 02/04/2013] sertraline (ZOLOFT) tablet 100 mg  100 mg Oral Daily Rachael Fee, MD      . sulfamethoxazole-trimethoprim (BACTRIM DS) 800-160 MG per tablet 1 tablet  1 tablet Oral Q12H Rachael Fee, MD   1 tablet at 02/03/13 [phone]  . thiamine (VITAMIN B-1) tablet 100 mg  100 mg Oral Daily Kerry Hough, PA-C   100 mg at 02/03/13 9604  . traZODone (DESYREL) tablet 150 mg  150 mg Oral QHS,MR X 1 Rachael Fee, MD   150 mg at 02/02/13 2231    Lab Results: No results found for this or any previous visit (from the past 48 hour(s)).  Physical Findings: AIMS: Facial and Oral Movements Muscles of Facial Expression: None, normal Lips and Perioral Area: None, normal Jaw: None, normal Tongue: None, normal,Extremity Movements Upper (arms,  wrists, hands, fingers): None, normal Lower (legs, knees, ankles, toes): None, normal, Trunk Movements Neck, shoulders, hips: None, normal, Overall Severity Severity of abnormal movements (highest score from questions above): None, normal Incapacitation due to abnormal movements: None, normal Patient's awareness of abnormal movements (rate only patient's report): No Awareness, Dental Status Current problems with teeth and/or dentures?: Yes Does patient usually wear dentures?: No  CIWA:  CIWA-Ar Total: 0 COWS:  COWS Total Score: 6  Treatment Plan Summary: Daily contact with patient to assess and evaluate symptoms and progress in treatment Medication management  Plan: Supportive approach/coping skills/relapse prevention           Continue to stabilize his mood           Will have the Seroquel 50 mg standing (he admits it helps his ruminating)  Medical Decision Making Problem Points:  Review of last therapy session (1) and Review of psycho-social stressors (1) Data Points:  Review of medication regiment & side effects (2)  I certify that inpatient services furnished can reasonably be expected to improve the patient's condition.   [name] [name] 02/03/2013, 6:08 PM

## 2013-02-04 DIAGNOSIS — F411 Generalized anxiety disorder: Secondary | ICD-10-CM

## 2013-02-04 MED ORDER — TRAZODONE HCL 150 MG PO TABS: 150.0000 mg | ORAL_TABLET | Freq: Every evening | ORAL | Status: DC | PRN

## 2013-02-04 MED ORDER — GABAPENTIN 600 MG PO TABS
600.0000 mg | ORAL_TABLET | Freq: Three times a day (TID) | ORAL | Status: DC
  Filled 2013-02-04: qty 42

## 2013-02-04 MED ORDER — BUSPIRONE HCL 15 MG PO TABS: 15.0000 mg | ORAL_TABLET | Freq: Two times a day (BID) | ORAL | Status: DC

## 2013-02-04 MED ORDER — QUETIAPINE FUMARATE 50 MG PO TABS: 50.0000 mg | ORAL_TABLET | ORAL | Status: DC

## 2013-02-04 MED ORDER — SERTRALINE HCL 100 MG PO TABS: 100.0000 mg | ORAL_TABLET | Freq: Every day | ORAL | Status: DC

## 2013-02-04 MED ORDER — GABAPENTIN 300 MG PO CAPS: 600.0000 mg | ORAL_CAPSULE | Freq: Three times a day (TID) | ORAL | Status: DC

## 2013-02-04 MED ORDER — LORATADINE 10 MG PO TABS: 10.0000 mg | ORAL_TABLET | Freq: Every day | ORAL | Status: DC

## 2013-02-04 NOTE — Progress Notes (Signed)
[facility] MD Progress Note  02/04/2013 4:37 PM [name] [name]  MRN:  [id number] Subjective:  Starting to feel better, less irritable. He is looking forward to go to [facility] in the morning. Feels that this is the right thing for him to do. He wants to get his life back together,. His daughter is graduating from [employer] and going to college. He wants to be in the graduation clean and sober. His son in in jail and when he gets out, he wants to be supportive and try to guide him by his example. Has not felt this strongly before Diagnosis:  Polysubstance Dependence/Mood dirder NOS, GAD  ADL's:  Intact  Sleep: Fair  Appetite:  Fair  Suicidal Ideation:  Plan:  denies Intent:  denies Means:  denies Homicidal Ideation:  Plan:  denies Intent:  denies Means:  denies AEB (as evidenced by):  Psychiatric Specialty Exam: Review of Systems  Constitutional: Negative.   HENT: Negative.   Eyes: Negative.   Respiratory: Negative.   Cardiovascular: Negative.   Gastrointestinal: Negative.   Genitourinary: Negative.   Musculoskeletal: Negative.   Skin: Negative.   Neurological: Negative.   Endo/Heme/Allergies: Negative.   Psychiatric/Behavioral: Positive for substance abuse. The patient is nervous/anxious.     Blood pressure 126/76, pulse 70, temperature 97.7 F (36.5 C), temperature source Oral, resp. rate 20, height 5\' 8"  (1.727 m), weight 83.008 kg (183 lb).Body mass index is 27.83 kg/(m^2).  General Appearance: Fairly Groomed  Patent attorney::  Fair  Speech:  Clear and Coherent and Slow  Volume:  Normal  Mood:  worried  Affect:  Appropriate  Thought Process:  Coherent and Goal Directed  Orientation:  Full (Time, Place, and Person)  Thought Content:  worries, concnerns, regrets, plans, hopes  Suicidal Thoughts:  No  Homicidal Thoughts:  No  Memory:  Immediate;   Fair Recent;   Fair Remote;   Fair  Judgement:  Fair  Insight:  Present  Psychomotor Activity:  Restlessness  Concentration:   Fair  Recall:  Fair  Akathisia:  No  Handed:  Right  AIMS (if indicated):     Assets:  Desire for Improvement  Sleep:  Number of Hours: 5   Current Medications: Current Facility-Administered Medications  Medication Dose Route Frequency Provider Last Rate Last Dose  . acetaminophen (TYLENOL) tablet 650 mg  650 mg Oral Q6H PRN Kerry Hough, PA-C   650 mg at 02/01/13 1605  . alum & mag hydroxide-simeth (MAALOX/MYLANTA) 200-200-20 MG/5ML suspension 30 mL  30 mL Oral Q4H PRN Kerry Hough, PA-C      . busPIRone (BUSPAR) tablet 15 mg  15 mg Oral BID Sanjuana Kava, NP   15 mg at 02/04/13 0815  . gabapentin (NEURONTIN) capsule 600 mg  600 mg Oral TID Rachael Fee, MD   600 mg at 02/04/13 1152  . hydrOXYzine (ATARAX/VISTARIL) tablet 50 mg  50 mg Oral Q6H PRN Sanjuana Kava, NP   50 mg at 02/04/13 0654  . loratadine (CLARITIN) tablet 10 mg  10 mg Oral Daily Sanjuana Kava, NP   10 mg at 02/04/13 0815  . magnesium hydroxide (MILK OF MAGNESIA) suspension 30 mL  30 mL Oral Daily PRN Kerry Hough, PA-C   30 mL at 01/31/13 2221  . multivitamin with minerals tablet 1 tablet  1 tablet Oral Daily Kerry Hough, PA-C   1 tablet at 02/04/13 0815  . nicotine (NICODERM CQ - dosed in mg/24 hours) patch 21 mg  21 mg Transdermal Q0600 Rachael Fee, MD   21 mg at 02/04/13 1610  . QUEtiapine (SEROQUEL) tablet 50 mg  50 mg Oral TID PRN Rachael Fee, MD   50 mg at 02/03/13 9604  . QUEtiapine (SEROQUEL) tablet 50 mg  50 mg Oral [id number] Rachael Fee, MD   50 mg at 02/04/13 1508  . sertraline (ZOLOFT) tablet 100 mg  100 mg Oral Daily Rachael Fee, MD   100 mg at 02/04/13 0816  . sulfamethoxazole-trimethoprim (BACTRIM DS) 800-160 MG per tablet 1 tablet  1 tablet Oral Q12H Rachael Fee, MD   1 tablet at 02/04/13 0815  . thiamine (VITAMIN B-1) tablet 100 mg  100 mg Oral Daily Kerry Hough, PA-C   100 mg at 02/04/13 0816  . traZODone (DESYREL) tablet 150 mg  150 mg Oral QHS,MR X 1 Rachael Fee, MD   150 mg  at 02/03/13 2309    Lab Results: No results found for this or any previous visit (from the past 48 hour(s)).  Physical Findings: AIMS: Facial and Oral Movements Muscles of Facial Expression: None, normal Lips and Perioral Area: None, normal Jaw: None, normal Tongue: None, normal,Extremity Movements Upper (arms, wrists, hands, fingers): None, normal Lower (legs, knees, ankles, toes): None, normal, Trunk Movements Neck, shoulders, hips: None, normal, Overall Severity Severity of abnormal movements (highest score from questions above): None, normal Incapacitation due to abnormal movements: None, normal Patient's awareness of abnormal movements (rate only patient's report): No Awareness, Dental Status Current problems with teeth and/or dentures?: Yes Does patient usually wear dentures?: No  CIWA:  CIWA-Ar Total: 1 COWS:  COWS Total Score: 6  Treatment Plan Summary: Daily contact with patient to assess and evaluate symptoms and progress in treatment Medication management  Plan: Supportive approach/coping skills/relapse prevention           Will D/C in AM to be admitted to [facility]  Medical Decision Making Problem Points:  Review of psycho-social stressors (1) Data Points:  Review of medication regiment & side effects (2)  I certify that inpatient services furnished can reasonably be expected to improve the patient's condition.   Adalene Gulotta A 02/04/2013, 4:37 PM

## 2013-02-04 NOTE — Progress Notes (Signed)
[facility] LCSW Aftercare Discharge Planning Group Note   02/04/2013 8:45 AM   Participation Quality:  Appropriate  Mood/Affect: Appropriate  Depression Rating:  2  Anxiety Rating:  6  Thoughts of Suicide: No  Will you contract for safety?   NA  Current AVH: None  Plan for Discharge/Comments:  [name] on 4/17  Transportation Means: Friends  Supports: AA friends  Vining, Julious Payer

## 2013-02-04 NOTE — Progress Notes (Signed)
Adult Psychoeducational Group Note  Date:  02/04/2013 Time:  10:39 PM  Group Topic/Focus:  NA  Participation Level:  Active  Participation Quality:  Appropriate  Affect:  Blunted  Cognitive:  Appropriate  Insight: Appropriate  Engagement in Group:  Engaged  Modes of Intervention:  Discussion  Additional Comments:    Flonnie Hailstone 02/04/2013, 10:39 PM

## 2013-02-04 NOTE — Progress Notes (Signed)
[facility] LCSW Group Therapy  02/04/2013 1:15 PM  Type of Therapy: Group Therapy 1:15 to 2:30 PM  Participation Level: Appropriate  Participation Quality:  Attentive and sharing  Affect: Irritabile  Cognitive:  Appropriate  Insight: Developing  Engagement in Therapy: Engaged  Modes of Intervention:  Discussion, Clarification, Education, and Support  Summary of Progress/Problems: Warmup portion of group allowed patient's to choose from multiple emotions as to how they are feeling today. Emotions listed ranged from anger, denial, loneliness, hopeful, guilty, shamed, happiness, guilt and hopelessness. Patients were then asked if those same emotions might be ones they often have difficulty regulating. Yordi was able to process how his irritability, restlessness and discontent often stand in the way of his progress as "it puts people off. I just am pretty raw here; what you see is what you get."   Harrill, Julious Payer

## 2013-02-04 NOTE — [facility] Suicide Risk Assessment (Signed)
Suicide Risk Assessment  Discharge Assessment     Demographic Factors:  Male, Caucasian and Unemployed  Mental Status Per Nursing Assessment::   On Admission:     Current Mental Status by Physician: In full contact with reality. There are no suicidal ideas,plans or intent. He is fully detox. His mood is more euthymic, hopeful, his affect is appropriate. He is looking forward to going to [facility] in the AM   Loss Factors: NA  Historical Factors: NA  Risk Reduction Factors:   Responsible for children under 46 years of age, Sense of responsibility to family and Positive social support  Continued Clinical Symptoms:  Polysubstance Dependence, Mood Disorder NOS, GAD   Cognitive Features That Contribute To Risk:  Thought constriction (tunnel vision)    Suicide Risk:  Minimal: No identifiable suicidal ideation.  Patients presenting with no risk factors but with morbid ruminations; may be classified as minimal risk based on the severity of the depressive symptoms  Discharge Diagnoses:   AXIS I:  Polysubstance Dependence, Mood Disorder NOS, Anxiety Disorder NOS AXIS II:  Deferred AXIS III:   Past Medical History  Diagnosis Date  . Bipolar disorder    AXIS IV:  other psychosocial or environmental problems AXIS V:  61-70 mild symptoms  Plan Of Care/Follow-up recommendations:  Activity:  as tolerated Diet:  regular To be admitted to [facility] in AM Is patient on multiple antipsychotic therapies at discharge:  No   Has Patient had three or more failed trials of antipsychotic monotherapy by history:  No  Recommended Plan for Multiple Antipsychotic Therapies: N/A   Depaul Arizpe A 02/04/2013, 4:46 PM

## 2013-02-04 NOTE — Discharge Summary (Signed)
Physician Discharge Summary Note  Patient:  [name] [name] is an 37 y.o., male MRN:  [id number] DOB:  [birth year] Patient phone:  [phone] (home)  Patient address:   [place] [address],   Date of Admission:  01/26/2013  Date of Discharge: 02/04/13  Reason for Admission:  Polysubstance abuse, Alcohol abuse  Discharge Diagnoses: Principal Problem:   Alcohol withdrawal Active Problems:   Polysubstance (including opioids) dependence with physiological dependence   Anxiety state, unspecified   Mood disorder in conditions classified elsewhere  Review of Systems  Constitutional: Negative.   HENT: Negative.   Eyes: Negative.   Respiratory: Negative.   Cardiovascular: Negative.   Gastrointestinal: Negative.   Genitourinary: Negative.   Musculoskeletal: Negative.   Skin: Negative.   Neurological: Negative.   Endo/Heme/Allergies: Negative.   Psychiatric/Behavioral: Positive for depression (Stabilized with medication prior to discharge) and substance abuse. Negative for suicidal ideas, hallucinations and memory loss. The patient is nervous/anxious (Stabilized with medication prior to discharge) and has insomnia (Stabilized with medication prior to discharge).    Axis Diagnosis:   AXIS I:  Alcohol Abuse and Polysubstance (including opioids) dependence with physiological dependence AXIS II:  Deferred AXIS III:   Past Medical History  Diagnosis Date  . Bipolar disorder    AXIS IV:  economic problems, educational problems, housing problems, occupational problems, other psychosocial or environmental problems and Substance absue issues AXIS V:  64  Level of Care:  [facility]  Hospital Course:  This is a 36 year old Caucasian male. Admitted to [facility] from the [facility] long hospital ED with request for alcohol and drug detoxification treatment. Patient reports, "My friend picked me up from the bus station yesterday. I had come down from [place] to [facility] for drug detox and treatment. I  need to get cleaned or else something bad will happen to me. I have been using all kinds of drugs, heroin, Meth, Cocaine, Xanax, methadone, Marijuana and alcohol. I started using very early in life. I can't say any more because I'm hurting. going through withdrawal symptoms. I'm so uncomfortable. I am having the shakes, my stomach cramping, my body aching, I feel nauseated. The longest that I have been sober is 10 months. I have had treatments in the past at the [facility] and [facility]. My mood is not good. My depression is at #8 right now".   After admission assessment and evaluation, it was determined that [name] [name] will need detoxification treatment to stabilize his system of alcohol intoxication and to combat the withdrawal symptoms of alcohol as well. And his discharge plans included a referral to a long term treatment facility [facility]) for a more intense substance abuse treatment. [name] [name] was then started on Librium protocol for his alcohol detoxification. He was also enrolled in group counseling sessions and activities to learn coping skills that should help him after discharge to cope better, manage his substance abuse problems to maintain a much longer sobriety. [name] [name] was anxious and restless most of th times he was here because he was constantly worrying about his recent diagnosis of Hepatitis B & C.  Besides the detoxification protocol, patient also received Gabapentin 300 mg for anxiety, Sertraline 100 mg Q bedtime for depression, Buspar 15 mg for anxiety, Trazodone 150 mg for sleep and Seroquel 50 mg for anxiety. He was also was enrolled and attended AA/NA meetings being offered and held on this unit. He has some previously existing and or identifiable medical conditions that required treatment and  or monitoring. He received medication management for all those health issues as well. He was monitored closely for any potential problems that may arise as a result of  result of and or during detoxification treatment. Patient tolerated his treatment regimen and detoxification treatment without any significant adverse effects and or reactions reported.  Patient attended treatment team meeting this am and met with the treatment team members. His reason for admission, present symptoms, substance abuse issues, response to treatment and discharge plans discussed. Patient endorsed that he is doing well and stable for discharge to pursue the next phase of his substance abuse treatment. It was agreed upon that he will continue substance abuse treatment at the [facility] in [place], [place] on 02/05/13. He is instructed to report at the lobby on the morning of 02/05/13 no later than 08:30 am. The address, date, time and contact information provided for patient in writing.   In addition to residential substance abuse treatment, [name] [name] is encouraged to join/attend AA/NA meetings being offered and held within his community. He is instructed and encouraged to get a trusted sponsor from the advise of others or from whomever within the AA meetings seems to make sense, and who has a proven track record, and will hold him responsible for his sobriety, and both expects and insists on his total  abstinence from alcohol.    Upon discharge, patient adamantly denies suicidal, homicidal ideations, auditory, visual hallucinations, delusional thinking and or withdrawal symptoms. Patient left [facility] with all personal belongings in no apparent distress. He received 2 weeks worth samples of his discharge medications. Transportation per bus, and bus fare/voucher provided by [employer]   Consults:  None  Significant Diagnostic Studies:  labs: CBC with diff. CMP, UDS, Toxicology tests,  Discharge Vitals:   Blood pressure 126/76, pulse 70, temperature 97.7 F (36.5 C), temperature source Oral, resp. rate 20, height 5\' 8"  (1.727 m), weight 83.008 kg (183 lb). Body mass index is 27.83  kg/(m^2). Lab Results:   No results found for this or any previous visit (from the past 72 hour(s)).  Physical Findings: AIMS: Facial and Oral Movements Muscles of Facial Expression: None, normal Lips and Perioral Area: None, normal Jaw: None, normal Tongue: None, normal,Extremity Movements Upper (arms, wrists, hands, fingers): None, normal Lower (legs, knees, ankles, toes): None, normal, Trunk Movements Neck, shoulders, hips: None, normal, Overall Severity Severity of abnormal movements (highest score from questions above): None, normal Incapacitation due to abnormal movements: None, normal Patient's awareness of abnormal movements (rate only patient's report): No Awareness, Dental Status Current problems with teeth and/or dentures?: Yes Does patient usually wear dentures?: No  CIWA:  CIWA-Ar Total: 0 COWS:  COWS Total Score: 6  Psychiatric Specialty Exam: See Psychiatric Specialty Exam and Suicide Risk Assessment completed by Attending Physician prior to discharge.  Discharge destination:  [name]  Is patient on multiple antipsychotic therapies at discharge:  No   Has Patient had three or more failed trials of antipsychotic monotherapy by history:  No  Recommended Plan for Multiple Antipsychotic Therapies: NA     Medication List    TAKE these medications     Indication   busPIRone 15 MG tablet  Commonly known as:  BUSPAR  Take 1 tablet (15 mg total) by mouth 2 (two) times daily. For anxiety   Indication:  Anxiety Disorder, Generalized Anxiety Disorder     gabapentin 300 MG capsule  Commonly known as:  NEURONTIN  Take 2 capsules (600 mg total) by mouth  3 (three) times daily. For anxiety/pain control   Indication:  Agitation, Neuropathic Pain, Anxiety     loratadine 10 MG tablet  Commonly known as:  CLARITIN  Take 1 tablet (10 mg total) by mouth daily. For allergies   Indication:  Perennial Rhinitis, Hayfever     QUEtiapine 50 MG tablet  Commonly  known as:  SEROQUEL  Take 1 tablet (50 mg total) by mouth 3 (three) times daily at 8am, 3pm and bedtime. For mood control   Indication:  Depressive Phase of Manic-Depression     sertraline 100 MG tablet  Commonly known as:  ZOLOFT  Take 1 tablet (100 mg total) by mouth daily. For depression   Indication:  Major Depressive Disorder     traZODone 150 MG tablet  Commonly known as:  DESYREL  Take 1 tablet (150 mg total) by mouth at bedtime and may repeat dose one time if needed. For depression/sleep   Indication:  Trouble Sleeping       Follow-up Information   Follow up with [facility]  On 02/05/2013. (Be at [facility] no later than 8:30 AM with medications needed for one month stay)    Contact information:   [address] [place] [address] [facility] [phone] FAX [phone]     Follow-up recommendations: Activity:  As tolerated Diet: As recommended by your primary care doctor. Keep all scheduled follow-up appointments as recommended.   Will go to [facility] to pursue further rehab treatment Comments:  Take all your medications as prescribed by your mental healthcare provider. Report any adverse effects and or reactions from your medicines to your outpatient provider promptly. Patient is instructed and cautioned to not engage in alcohol and or illegal drug use while on prescription medicines. In the event of worsening symptoms, patient is instructed to call the crisis hotline, 911 and or go to the nearest ED for appropriate evaluation and treatment of symptoms. Follow-up with your primary care provider for your other medical issues, concerns and or health care needs.   Total Discharge Time:  Greater than 30 minutes.  SignedArmandina Stammer I 02/05/2013, 9:15 AM

## 2013-02-04 NOTE — Progress Notes (Signed)
Pt doing much better he has not asked for any prn medications or has not had any behaviors to warrant any either.  He feels that maybe his medications are starting to work for him.  He rated his anxiety still at 6 this morning but did not appear anxious at all.  He rates both depression and hopelessness a 3 on his self-inventory.  He has an order to leave in the morning to [facility].  He denies any S/H ideation or A/V hallucinations.

## 2013-02-05 DIAGNOSIS — F411 Generalized anxiety disorder: Secondary | ICD-10-CM

## 2013-02-05 DIAGNOSIS — F39 Unspecified mood [affective] disorder: Secondary | ICD-10-CM

## 2013-02-05 DIAGNOSIS — F1920 Other psychoactive substance dependence, uncomplicated: Secondary | ICD-10-CM

## 2013-02-05 DIAGNOSIS — F10239 Alcohol dependence with withdrawal, unspecified: Secondary | ICD-10-CM

## 2013-02-05 NOTE — Progress Notes (Signed)
Pt reports he is doing better, but he is feeling anxious about his discharge in the morning.  He knows this is what he needs to do.  He wants to be a better role model for his children.  He denies SI/HI/AV.  He voices not needs/concerns at this time.  Pt wants to know if staff will get him up in time to be out by 0700.  Assured pt that this writer would get him up in time to do the discharge and have him in the lobby by 0700 to go to [facility].  Support/encouragement given.  Safety maintained with q15 minute checks.

## 2013-02-05 NOTE — Progress Notes (Signed)
Pt discharged to go to [facility] this morning.  Pt's discharge instructions reviewed and paperwork signed.  Pt voiced understanding of instructions.  The items in pt's locker were returned.  Pt to be transported by a friend.  Pt reports he is felling well this morning and ready for discharge.  Pt expressed gratitude for help received at [facility].

## 2013-02-10 NOTE — Progress Notes (Signed)
Patient Discharge Instructions:  After Visit Summary (AVS):   Faxed to:  02/10/13 Discharge Summary Note:   Faxed to:  02/10/13 Psychiatric Admission Assessment Note:   Faxed to:  02/10/13 Suicide Risk Assessment - Discharge Assessment:   Faxed to:  02/10/13 Faxed/Sent to the Next Level Care provider:  02/10/13 Faxed to [facility] @ [phone]  Jerelene Redden, 02/10/2013, 4:28 PM

## 2014-01-27 ENCOUNTER — Encounter (HOSPITAL_COMMUNITY): Payer: Self-pay | Admitting: Emergency Medicine

## 2014-01-27 ENCOUNTER — Inpatient Hospital Stay (HOSPITAL_COMMUNITY)
Admission: EM | Admit: 2014-01-27 | Discharge: 2014-02-06 | DRG: 872 | Disposition: A | Discharge status: Home / Self Care | Payer: Medicare Other | Attending: Internal Medicine | Admitting: Internal Medicine

## 2014-01-27 DIAGNOSIS — F411 Generalized anxiety disorder: Secondary | ICD-10-CM | POA: Diagnosis present

## 2014-01-27 DIAGNOSIS — A419 Sepsis, unspecified organism: Secondary | ICD-10-CM | POA: Diagnosis present

## 2014-01-27 DIAGNOSIS — L03119 Cellulitis of unspecified part of limb: Secondary | ICD-10-CM

## 2014-01-27 DIAGNOSIS — M79609 Pain in unspecified limb: Secondary | ICD-10-CM | POA: Diagnosis present

## 2014-01-27 DIAGNOSIS — F1920 Other psychoactive substance dependence, uncomplicated: Secondary | ICD-10-CM | POA: Diagnosis present

## 2014-01-27 DIAGNOSIS — F0630 Mood disorder due to known physiological condition, unspecified: Secondary | ICD-10-CM | POA: Diagnosis present

## 2014-01-27 DIAGNOSIS — Z6832 Body mass index (BMI) 32.0-32.9, adult: Secondary | ICD-10-CM

## 2014-01-27 DIAGNOSIS — B1920 Unspecified viral hepatitis C without hepatic coma: Secondary | ICD-10-CM | POA: Diagnosis present

## 2014-01-27 DIAGNOSIS — F10939 Alcohol use, unspecified with withdrawal, unspecified: Secondary | ICD-10-CM

## 2014-01-27 DIAGNOSIS — F319 Bipolar disorder, unspecified: Secondary | ICD-10-CM | POA: Diagnosis present

## 2014-01-27 DIAGNOSIS — F10239 Alcohol dependence with withdrawal, unspecified: Secondary | ICD-10-CM

## 2014-01-27 DIAGNOSIS — F1120 Opioid dependence, uncomplicated: Secondary | ICD-10-CM | POA: Diagnosis present

## 2014-01-27 DIAGNOSIS — F17200 Nicotine dependence, unspecified, uncomplicated: Secondary | ICD-10-CM | POA: Diagnosis present

## 2014-01-27 DIAGNOSIS — F1011 Alcohol abuse, in remission: Secondary | ICD-10-CM | POA: Diagnosis present

## 2014-01-27 DIAGNOSIS — L0390 Cellulitis, unspecified: Secondary | ICD-10-CM

## 2014-01-27 DIAGNOSIS — L03116 Cellulitis of left lower limb: Secondary | ICD-10-CM | POA: Diagnosis present

## 2014-01-27 DIAGNOSIS — F1210 Cannabis abuse, uncomplicated: Secondary | ICD-10-CM | POA: Diagnosis present

## 2014-01-27 DIAGNOSIS — K089 Disorder of teeth and supporting structures, unspecified: Secondary | ICD-10-CM | POA: Diagnosis present

## 2014-01-27 DIAGNOSIS — L02419 Cutaneous abscess of limb, unspecified: Secondary | ICD-10-CM | POA: Diagnosis present

## 2014-01-27 DIAGNOSIS — Z79899 Other long term (current) drug therapy: Secondary | ICD-10-CM

## 2014-01-27 DIAGNOSIS — R52 Pain, unspecified: Secondary | ICD-10-CM | POA: Diagnosis present

## 2014-01-27 HISTORY — DX: Depression, unspecified: F32.A

## 2014-01-27 HISTORY — DX: Major depressive disorder, single episode, unspecified: F32.9

## 2014-01-27 HISTORY — DX: Unspecified viral hepatitis C without hepatic coma: B19.20

## 2014-01-27 LAB — CBC WITH DIFFERENTIAL/PLATELET
BASOS ABS: 0 10*3/uL (ref 0.0–0.1)
Basophils Relative: 0 % (ref 0–1)
EOS ABS: 0 10*3/uL (ref 0.0–0.7)
Eosinophils Relative: 0 % (ref 0–5)
HCT: 43.9 % (ref 39.0–52.0)
Hemoglobin: 15.3 g/dL (ref 13.0–17.0)
LYMPHS PCT: 3 % — AB (ref 12–46)
Lymphs Abs: 1.1 10*3/uL (ref 0.7–4.0)
MCH: 33 pg (ref 26.0–34.0)
MCHC: 34.9 g/dL (ref 30.0–36.0)
MCV: 94.8 fL (ref 78.0–100.0)
MONO ABS: 2.2 10*3/uL — AB (ref 0.1–1.0)
Monocytes Relative: 6 % (ref 3–12)
NEUTROS PCT: 91 % — AB (ref 43–77)
Neutro Abs: 32.9 10*3/uL — ABNORMAL HIGH (ref 1.7–7.7)
Platelets: 234 10*3/uL (ref 150–400)
RBC: 4.63 MIL/uL (ref 4.22–5.81)
RDW: 13.7 % (ref 11.5–15.5)
WBC Morphology: INCREASED
WBC: 36.2 10*3/uL — ABNORMAL HIGH (ref 4.0–10.5)

## 2014-01-27 MED ORDER — MORPHINE SULFATE 4 MG/ML IJ SOLN
4.0000 mg | Freq: Once | INTRAMUSCULAR | Status: AC
  Administered 2014-01-27: 4 mg via INTRAVENOUS
  Filled 2014-01-27: qty 1

## 2014-01-27 MED ORDER — SODIUM CHLORIDE 0.9 % IV BOLUS (SEPSIS)
1000.0000 mL | Freq: Once | INTRAVENOUS | Status: AC
  Administered 2014-01-27: 1000 mL via INTRAVENOUS

## 2014-01-27 MED ORDER — ONDANSETRON HCL 4 MG/2ML IJ SOLN
4.0000 mg | Freq: Once | INTRAMUSCULAR | Status: AC
  Administered 2014-01-27: 4 mg via INTRAVENOUS
  Filled 2014-01-27: qty 2

## 2014-01-27 MED ORDER — VANCOMYCIN HCL IN DEXTROSE 1-5 GM/200ML-% IV SOLN
1000.0000 mg | Freq: Once | INTRAVENOUS | Status: AC
  Administered 2014-01-27: 1000 mg via INTRAVENOUS
  Filled 2014-01-27: qty 200

## 2014-01-27 MED ORDER — OXYCODONE-ACETAMINOPHEN 5-325 MG PO TABS
1.0000 | ORAL_TABLET | Freq: Once | ORAL | Status: AC
  Administered 2014-01-27: 1 via ORAL
  Filled 2014-01-27: qty 1

## 2014-01-27 NOTE — ED Provider Notes (Signed)
CSN: [id number]     Arrival date & time 01/27/14  1849 History   First MD Initiated Contact with Patient 01/27/14 2215     Chief Complaint  Patient presents with  . Foot Pain  . Back Pain     (Consider location/radiation/quality/duration/timing/severity/associated sxs/prior Treatment) HPI Comments: Patient presents to the emergency department with chief complaint of left leg pain. He states that he noticed the pain this afternoon around 1:00. She complains of swelling, and pain, and wants to the left foot and leg. He denies fevers, chills, vomiting, or diarrhea. He has not tried taking anything to alleviate his symptoms. The pain is worsened with walking, and palpation. It is improved with rest. He has a history of cellulitis in the past.  The history is provided by the patient. No language interpreter was used.    Past Medical History  Diagnosis Date  . Bipolar disorder   . Hepatitis C   . Depression    Past Surgical History  Procedure Laterality Date  . Back surgery    . Shoulder surgery     History reviewed. No pertinent family history. History  Substance Use Topics  . Smoking status: Current Every Day Smoker    Types: Cigarettes  . Smokeless tobacco: Not on file  . Alcohol Use: Yes     Comment: Patient reports using THC, Xanax, Heroin, and Alcohol     Review of Systems  Constitutional: Negative for fever and chills.  Respiratory: Negative for shortness of breath.   Cardiovascular: Negative for chest pain.  Gastrointestinal: Negative for nausea, vomiting, diarrhea and constipation.  Genitourinary: Negative for dysuria.  Skin: Positive for rash.      Allergies  Lithium and Penicillins  Home Medications   Current Outpatient Rx  Name  Route  Sig  Dispense  Refill  . gabapentin (NEURONTIN) 300 MG capsule   Oral   Take 2 capsules (600 mg total) by mouth 3 (three) times daily. For anxiety/pain control   180 capsule   0   . ibuprofen (ADVIL,MOTRIN) 200 MG  tablet   Oral   Take 600 mg by mouth every 6 (six) hours as needed for moderate pain.         Marland Kitchen sertraline (ZOLOFT) 100 MG tablet   Oral   Take 1 tablet (100 mg total) by mouth daily. For depression   30 tablet   0   . traZODone (DESYREL) 150 MG tablet   Oral   Take 1 tablet (150 mg total) by mouth at bedtime and may repeat dose one time if needed. For depression/sleep   30 tablet   0    BP 120/58  Pulse 110  Temp(Src) 98.8 F (37.1 C) (Oral)  Resp 20  Ht 5\' 9"  (1.753 m)  Wt 220 lb (99.791 kg)  BMI 32.47 kg/m2  SpO2 98% Physical Exam  Nursing note and vitals reviewed. Constitutional: He is oriented to person, place, and time. He appears well-developed and well-nourished.  HENT:  Head: Normocephalic and atraumatic.  Eyes: Conjunctivae and EOM are normal. Pupils are equal, round, and reactive to light. Right eye exhibits no discharge. Left eye exhibits no discharge. No scleral icterus.  Neck: Normal range of motion. Neck supple. No JVD present.  Cardiovascular: Regular rhythm, normal heart sounds and intact distal pulses.  Exam reveals no gallop and no friction rub.   No murmur heard. Tachycardic, and intact distal pulses with brisk capillary refill  Pulmonary/Chest: Effort normal and breath sounds normal. No respiratory  distress. He has no wheezes. He has no rales. He exhibits no tenderness.  Abdominal: Soft. He exhibits no distension and no mass. There is no tenderness. There is no rebound and no guarding.  Musculoskeletal: Normal range of motion. He exhibits no edema and no tenderness.  Neurological: He is alert and oriented to person, place, and time.  Skin: Skin is warm and dry.     Cellulitis left lower extremity as diagrammed  Psychiatric: He has a normal mood and affect. His behavior is normal. Judgment and thought content normal.    ED Course  Procedures (including critical care time) Results for orders placed during the hospital encounter of 01/27/14  CBC  WITH DIFFERENTIAL      Result Value Ref Range   WBC 36.2 (*) 4.0 - 10.5 K/uL   RBC 4.63  4.22 - 5.81 MIL/uL   Hemoglobin 15.3  13.0 - 17.0 g/dL   HCT 78.243.9  95.639.0 - 21.352.0 %   MCV 94.8  78.0 - 100.0 fL   MCH 33.0  26.0 - 34.0 pg   MCHC 34.9  30.0 - 36.0 g/dL   RDW 08.613.7  57.811.5 - 46.915.5 %   Platelets 234  150 - 400 K/uL   Neutrophils Relative % 91 (*) 43 - 77 %   Lymphocytes Relative 3 (*) 12 - 46 %   Monocytes Relative 6  3 - 12 %   Eosinophils Relative 0  0 - 5 %   Basophils Relative 0  0 - 1 %   Neutro Abs 32.9 (*) 1.7 - 7.7 K/uL   Lymphs Abs 1.1  0.7 - 4.0 K/uL   Monocytes Absolute 2.2 (*) 0.1 - 1.0 K/uL   Eosinophils Absolute 0.0  0.0 - 0.7 K/uL   Basophils Absolute 0.0  0.0 - 0.1 K/uL   WBC Morphology INCREASED BANDS (>20% BANDS)     Smear Review LARGE PLATELETS PRESENT    BASIC METABOLIC PANEL      Result Value Ref Range   Sodium 134 (*) 137 - 147 mEq/L   Potassium 4.6  3.7 - 5.3 mEq/L   Chloride 98  96 - 112 mEq/L   CO2 24  19 - 32 mEq/L   Glucose, Bld 83  70 - 99 mg/dL   BUN 13  6 - 23 mg/dL   Creatinine, Ser 6.290.75  0.50 - 1.35 mg/dL   Calcium 8.9  8.4 - 52.810.5 mg/dL   GFR calc non Af Amer >90  >90 mL/min   GFR calc Af Amer >90  >90 mL/min  I-STAT CG4 LACTIC ACID, ED      Result Value Ref Range   Lactic Acid, Venous 1.51  0.5 - 2.2 mmol/L   No results found.  Imaging Review No results found.   EKG Interpretation None      MDM   Final diagnoses:  Cellulitis   Patient with cellulitis. Will start antibiotics, check labs, give fluids, and pain medicine. Will reassess. He reports that the redness has spread very quickly. I will mark the area with skin marker, and will reevaluate.  12:53 AM Patient is feeling better with pain meds on board.  1:12 AM Discussed the patient with Dr. Norlene Campbelltter, who recommends admission.  Filed Vitals:   01/28/14 0014  BP: 126/67  Pulse: 67  Temp: 98.5 F (36.9 C)  Resp: 24   1:20 AM Discussed the patient with Dr. Onalee Huaavid, who will  admit the patient.    Roxy Horsemanobert Seaborn Nakama, PA-C 01/28/14 0120

## 2014-01-27 NOTE — ED Notes (Signed)
Pt returned to nurses station, pt states pain in leg/foot worse at this time.

## 2014-01-27 NOTE — ED Notes (Signed)
2 weeks ago pt moved a couch and hurt back. Pt states that pain is sharp at times and dull others, pain is located to L lower back. Pt states that he has swelling to L lower foot with weakness and pain to area. Pt states that he took ibuprofen for coldness and chills. Pt has redness to dorsal surface of foot with swelling. Pt reports constant pain to foot that goes up his L leg. Pt denies SOB, dizziness or lightheadedness. Pt alert and sitting in wheel chair.

## 2014-01-27 NOTE — ED Notes (Signed)
Patient asking for additional pain medication Will make PA aware

## 2014-01-28 ENCOUNTER — Encounter (HOSPITAL_COMMUNITY): Payer: Self-pay | Admitting: Orthopedic Surgery

## 2014-01-28 DIAGNOSIS — F411 Generalized anxiety disorder: Secondary | ICD-10-CM

## 2014-01-28 DIAGNOSIS — L0291 Cutaneous abscess, unspecified: Secondary | ICD-10-CM

## 2014-01-28 DIAGNOSIS — F1920 Other psychoactive substance dependence, uncomplicated: Secondary | ICD-10-CM

## 2014-01-28 DIAGNOSIS — L0390 Cellulitis, unspecified: Secondary | ICD-10-CM

## 2014-01-28 DIAGNOSIS — F0630 Mood disorder due to known physiological condition, unspecified: Secondary | ICD-10-CM

## 2014-01-28 LAB — RAPID URINE DRUG SCREEN, HOSP PERFORMED
AMPHETAMINES: NOT DETECTED
Barbiturates: NOT DETECTED
Benzodiazepines: NOT DETECTED
Cocaine: NOT DETECTED
OPIATES: NOT DETECTED
TETRAHYDROCANNABINOL: NOT DETECTED

## 2014-01-28 LAB — BASIC METABOLIC PANEL
BUN: 13 mg/dL (ref 6–23)
CALCIUM: 8.9 mg/dL (ref 8.4–10.5)
CHLORIDE: 98 meq/L (ref 96–112)
CO2: 24 meq/L (ref 19–32)
CREATININE: 0.75 mg/dL (ref 0.50–1.35)
GFR calc non Af Amer: >90 mL/min (ref >90)
Glucose, Bld: 83 mg/dL (ref 70–99)
Potassium: 4.6 mEq/L (ref 3.7–5.3)
Sodium: 134 mEq/L — ABNORMAL LOW (ref 137–147)

## 2014-01-28 LAB — SEDIMENTATION RATE: SED RATE: 4 mm/h (ref 0–16)

## 2014-01-28 LAB — I-STAT CG4 LACTIC ACID, ED: LACTIC ACID, VENOUS: 1.51 mmol/L (ref 0.5–2.2)

## 2014-01-28 MED ORDER — TRAZODONE HCL 150 MG PO TABS
150.0000 mg | ORAL_TABLET | Freq: Every evening | ORAL | Status: DC | PRN
  Administered 2014-01-28: 150 mg via ORAL
  Filled 2014-01-28 (×4): qty 1

## 2014-01-28 MED ORDER — OXYCODONE-ACETAMINOPHEN 5-325 MG PO TABS
2.0000 | ORAL_TABLET | Freq: Once | ORAL | Status: AC
  Administered 2014-01-28: 2 via ORAL
  Filled 2014-01-28: qty 2

## 2014-01-28 MED ORDER — KETOROLAC TROMETHAMINE 30 MG/ML IJ SOLN
30.0000 mg | Freq: Once | INTRAMUSCULAR | Status: AC
Start: 2014-01-28 — End: 2014-01-28
  Administered 2014-01-28: 30 mg via INTRAVENOUS
  Filled 2014-01-28: qty 2
  Filled 2014-01-28: qty 1

## 2014-01-28 MED ORDER — SODIUM CHLORIDE 0.9 % IJ SOLN
3.0000 mL | Freq: Two times a day (BID) | INTRAMUSCULAR | Status: DC
  Administered 2014-01-28 – 2014-02-06 (×6): 3 mL via INTRAVENOUS

## 2014-01-28 MED ORDER — SODIUM CHLORIDE 0.9 % IV SOLN
250.0000 mL | INTRAVENOUS | Status: DC | PRN
Start: 2014-01-28 — End: 2014-02-06
  Administered 2014-01-30 – 2014-02-04 (×2): 250 mL via INTRAVENOUS

## 2014-01-28 MED ORDER — ACETAMINOPHEN 325 MG PO TABS: 650.0000 mg | ORAL_TABLET | Freq: Four times a day (QID) | ORAL | Status: DC | PRN

## 2014-01-28 MED ORDER — IBUPROFEN 600 MG PO TABS
600.0000 mg | ORAL_TABLET | Freq: Four times a day (QID) | ORAL | Status: DC | PRN
  Administered 2014-01-28 – 2014-02-05 (×6): 600 mg via ORAL
  Filled 2014-01-28 (×6): qty 1

## 2014-01-28 MED ORDER — HYDROMORPHONE HCL PF 1 MG/ML IJ SOLN
1.0000 mg | Freq: Once | INTRAMUSCULAR | Status: AC
  Administered 2014-01-28: 1 mg via INTRAVENOUS
  Filled 2014-01-28: qty 1

## 2014-01-28 MED ORDER — SERTRALINE HCL 50 MG PO TABS
150.0000 mg | ORAL_TABLET | Freq: Every day | ORAL | Status: DC
  Administered 2014-01-29 – 2014-02-06 (×9): 150 mg via ORAL
  Filled 2014-01-28 (×10): qty 1

## 2014-01-28 MED ORDER — VANCOMYCIN HCL IN DEXTROSE 1-5 GM/200ML-% IV SOLN
1000.0000 mg | Freq: Three times a day (TID) | INTRAVENOUS | Status: DC
  Administered 2014-01-28 – 2014-01-30 (×7): 1000 mg via INTRAVENOUS
  Filled 2014-01-28 (×9): qty 200

## 2014-01-28 MED ORDER — SODIUM CHLORIDE 0.9 % IJ SOLN
3.0000 mL | INTRAMUSCULAR | Status: DC | PRN
  Administered 2014-02-02: 9 mL via INTRAVENOUS

## 2014-01-28 MED ORDER — SERTRALINE HCL 100 MG PO TABS
100.0000 mg | ORAL_TABLET | Freq: Every day | ORAL | Status: DC
  Administered 2014-01-28: 100 mg via ORAL
  Filled 2014-01-28: qty 1

## 2014-01-28 MED ORDER — GABAPENTIN 300 MG PO CAPS
600.0000 mg | ORAL_CAPSULE | Freq: Three times a day (TID) | ORAL | Status: DC
  Administered 2014-01-28 – 2014-01-31 (×12): 600 mg via ORAL
  Filled 2014-01-28 (×15): qty 2

## 2014-01-28 MED ORDER — OXYCODONE-ACETAMINOPHEN 5-325 MG PO TABS
1.0000 | ORAL_TABLET | ORAL | Status: DC | PRN
  Administered 2014-01-28 – 2014-01-29 (×7): 1 via ORAL
  Filled 2014-01-28 (×7): qty 1

## 2014-01-28 MED ORDER — TRAMADOL HCL 50 MG PO TABS
50.0000 mg | ORAL_TABLET | Freq: Four times a day (QID) | ORAL | Status: DC | PRN
  Administered 2014-01-28 – 2014-01-30 (×8): 50 mg via ORAL
  Filled 2014-01-28 (×8): qty 1

## 2014-01-28 MED ORDER — ACETAMINOPHEN 650 MG RE SUPP: 650.0000 mg | Freq: Four times a day (QID) | RECTAL | Status: DC | PRN

## 2014-01-28 MED ORDER — NICOTINE 21 MG/24HR TD PT24
21.0000 mg | MEDICATED_PATCH | Freq: Every day | TRANSDERMAL | Status: DC
  Administered 2014-01-28 – 2014-02-05 (×9): 21 mg via TRANSDERMAL
  Filled 2014-01-28 (×10): qty 1

## 2014-01-28 NOTE — Progress Notes (Signed)
Pt c/o pain informed that he could have tylenol and motrin now. Pt refused.

## 2014-01-28 NOTE — Progress Notes (Signed)
ANTIBIOTIC CONSULT NOTE - INITIAL  Pharmacy Consult for vancomycin Indication: Cellulitis  Allergies  Allergen Reactions  . Lithium Anaphylaxis  . Penicillins Rash    Patient Measurements: Height: 5\' 9"  (175.3 cm) Weight: 220 lb (99.791 kg) IBW/kg (Calculated) : 70.7 Adjusted Body Weight:   Vital Signs: Temp: 98.5 F (36.9 C) (04/09 0014) Temp src: Oral (04/09 0014) BP: 145/68 mmHg (04/09 0116) Pulse Rate: 104 (04/09 0116) Intake/Output from previous day: 04/08 0701 - 04/09 0700 In: -  Out: 500 [Urine:500] Intake/Output from this shift: Total I/O In: -  Out: 500 [Urine:500]  Labs:  Recent Labs  01/27/14 2314 01/28/14 0022  WBC 36.2*  --   HGB 15.3  --   PLT 234  --   CREATININE  --  0.75   Estimated Creatinine Clearance: 150 ml/min (by C-G formula based on Cr of 0.75). No results found for this basename: VANCOTROUGH, VANCOPEAK, VANCORANDOM, GENTTROUGH, GENTPEAK, GENTRANDOM, TOBRATROUGH, TOBRAPEAK, TOBRARND, AMIKACINPEAK, AMIKACINTROU, AMIKACIN,  in the last 72 hours   Microbiology: No results found for this or any previous visit (from the past 720 hour(s)).  Medical History: Past Medical History  Diagnosis Date  . Bipolar disorder   . Hepatitis C   . Depression     Medications:  Anti-infectives   Start     Dose/Rate Route Frequency Ordered Stop   01/27/14 2315  vancomycin (VANCOCIN) IVPB 1000 mg/200 mL premix     1,000 mg 200 mL/hr over 60 Minutes Intravenous  Once 01/27/14 2301 01/28/14 0018     Assessment: Patient with cellulitis.  First dose of antibiotics already given   Goal of Therapy:  Vancomycin trough level 10-15 mcg/ml  Plan:  Measure antibiotic drug levels at steady state Follow up culture results vancomycin 1gm iv q8hr  [employer] Specialty ChemicalsJulian Crowford Alyssamae Klinck Jr. 01/28/2014,1:58 AM

## 2014-01-28 NOTE — H&P (Signed)
Chief Complaint:  Red to leg  HPI: 38 yo male h/o polysubstance sober for over one year, comes in with a red rash to lle that has gotten bigger and more painful since 1pm today.  No injury to this leg.  No fevers.  No n/v/d.  No chills.  No sores in this leg.  No recent abx.  Pt has significant cellulitis to left leg.   Review of Systems:  Positive and negative as per HPI otherwise all other systems are negative  Past Medical History: Past Medical History  Diagnosis Date  . Bipolar disorder   . Hepatitis C   . Depression    Past Surgical History  Procedure Laterality Date  . Back surgery    . Shoulder surgery      Medications: Prior to Admission medications   Medication Sig Start Date End Date Taking? Authorizing Provider  gabapentin (NEURONTIN) 300 MG capsule Take 2 capsules (600 mg total) by mouth 3 (three) times daily. For anxiety/pain control 02/04/13  Yes Sanjuana Kava, NP  ibuprofen (ADVIL,MOTRIN) 200 MG tablet Take 600 mg by mouth every 6 (six) hours as needed for moderate pain.   Yes Historical Provider, MD  sertraline (ZOLOFT) 100 MG tablet Take 1 tablet (100 mg total) by mouth daily. For depression 02/04/13  Yes Sanjuana Kava, NP  traZODone (DESYREL) 150 MG tablet Take 1 tablet (150 mg total) by mouth at bedtime and may repeat dose one time if needed. For depression/sleep 02/04/13  Yes Sanjuana Kava, NP    Allergies:   Allergies  Allergen Reactions  . Lithium Anaphylaxis  . Penicillins Rash    Social History:  reports that he has been smoking Cigarettes.  He has been smoking about 0.00 packs per day. He does not have any smokeless tobacco history on file. He reports that he drinks alcohol. He reports that he uses illicit drugs (Marijuana, Benzodiazepines, Other-see comments, and Methamphetamines).  Family History: History reviewed. No pertinent family history.  Physical Exam: Filed Vitals:   01/27/14 1933 01/27/14 1953 01/28/14 0014 01/28/14 0116  BP: 120/58   126/67 145/68  Pulse: 119 110 67 104  Temp: 98.8 F (37.1 C)  98.5 F (36.9 C)   TempSrc: Oral  Oral   Resp: 20  24 18   Height: 5\' 9"  (1.753 m)     Weight: 99.791 kg (220 lb)     SpO2: 98%  99% 92%   General appearance: alert, cooperative and no distress Head: Normocephalic, without obvious abnormality, atraumatic Eyes: negative Nose: Nares normal. Septum midline. Mucosa normal. No drainage or sinus tenderness. Neck: no JVD and supple, symmetrical, trachea midline Lungs: clear to auscultation bilaterally Heart: regular rate and rhythm, S1, S2 normal, no murmur, click, rub or gallop Abdomen: soft, non-tender; bowel sounds normal; no masses,  no organomegaly Extremities: extremities normal, atraumatic, no cyanosis or edema Pulses: 2+ and symmetric Skin: Skin color, texture, turgor normal. No rashes or lesions except cellulitis to lle from 2/3 up leg from foot area marked out with marker Neurologic: Grossly normal   Labs on Admission:   Recent Labs  01/28/14 0022  NA 134*  K 4.6  CL 98  CO2 24  GLUCOSE 83  BUN 13  CREATININE 0.75  CALCIUM 8.9    Recent Labs  01/27/14 2314  WBC 36.2*  NEUTROABS 32.9*  HGB 15.3  HCT 43.9  MCV 94.8  PLT 234   Radiological Exams on Admission: No results found.  Assessment/Plan  38 yo  male with lle cellulitis  Principal Problem:   Cellulitis-  Place on iv vancomycin .  Tylenol prn.  Try to avoid controlled substances due to his addiction history.  This should improve in the next 24 to 48 hours.  Active Problems:   Polysubstance (including opioids) dependence with physiological dependence   Anxiety state, unspecified   Mood disorder in conditions classified elsewhere    Rachal A David 01/28/2014, 1:26 AM

## 2014-01-28 NOTE — ED Provider Notes (Signed)
Medical screening examination/treatment/procedure(s) were performed by non-physician practitioner and as supervising physician I was immediately available for consultation/collaboration.   EKG Interpretation None       Florita Nitsch M Yaquelin Langelier, MD 01/28/14 0745 

## 2014-01-28 NOTE — ED Notes (Signed)
Hospitalist at bedside 

## 2014-01-28 NOTE — Progress Notes (Signed)
   [facility] PROGRESS NOTE  Assessment/Plan: *Cellulitis: -  IV vanc, repeat CBC in am. - blood culture pending.  Mood disorder in conditions classified elsewhere: - cont sertraline.  Polysubstance (including opioids) dependence with physiological dependence - avoid narcotics. - uds negative.    Code Status: full Family Communication: none  Disposition Plan: inpatient   Consultants:  none  Procedures:    Antibiotics:  Vancomycin 4.9.2015  HPI/Subjective: complaining of left leg pain  Objective: Filed Vitals:   01/28/14 0014 01/28/14 0116 01/28/14 0230 01/28/14 0640  BP: 126/67 145/68 133/75 97/57  Pulse: 67 104 113 91  Temp: 98.5 F (36.9 C)  98.8 F (37.1 C) 98.2 F (36.8 C)  TempSrc: Oral  Oral Oral  Resp: 24 18 18 20   Height:   5\' 9"  (1.753 m)   Weight:   104.6 kg (230 lb 9.6 oz)   SpO2: 99% 92% 91% 92%    Intake/Output Summary (Last 24 hours) at 01/28/14 0810 Last data filed at 01/28/14 0304  Gross per 24 hour  Intake      0 ml  Output    650 ml  Net   -650 ml   Filed Weights   01/27/14 1933 01/28/14 0230  Weight: 99.791 kg (220 lb) 104.6 kg (230 lb 9.6 oz)    Exam:  General: Alert, awake, oriented x3, in no acute distress.  HEENT: No bruits, no goiter.  Heart: Regular rate and rhythm,  Lungs: Good air movement, clear.  Abdomen: Soft, nontender, nondistended, positive bowel sounds.  Skin: red lower ext erythematous. And tender.    Data Reviewed: Basic Metabolic Panel:  Recent Labs Lab 01/28/14 0022  NA 134*  K 4.6  CL 98  CO2 24  GLUCOSE 83  BUN 13  CREATININE 0.75  CALCIUM 8.9   Liver Function Tests: No results found for this basename: AST, ALT, ALKPHOS, BILITOT, PROT, ALBUMIN,  in the last 168 hours No results found for this basename: LIPASE, AMYLASE,  in the last 168 hours No results found for this basename: AMMONIA,  in the last 168 hours CBC:  Recent Labs Lab 01/27/14 2314  WBC 36.2*  NEUTROABS  32.9*  HGB 15.3  HCT 43.9  MCV 94.8  PLT 234   Cardiac Enzymes: No results found for this basename: CKTOTAL, CKMB, CKMBINDEX, TROPONINI,  in the last 168 hours BNP (last 3 results) No results found for this basename: PROBNP,  in the last 8760 hours CBG: No results found for this basename: GLUCAP,  in the last 168 hours  No results found for this or any previous visit (from the past 240 hour(s)).   Studies: No results found.  Scheduled Meds: . gabapentin  600 mg Oral TID  . nicotine  21 mg Transdermal Daily  . sertraline  100 mg Oral Daily  . sodium chloride  3 mL Intravenous Q12H  . traZODone  150 mg Oral QHS,MR X 1  . vancomycin  1,000 mg Intravenous Q8H   Continuous Infusions:    [name] [name]  [facility] Pager [phone]. If 8PM-8AM, please contact night-coverage at [email or web address], password [email or web address] 01/28/2014, 8:10 AM  LOS: 1 day

## 2014-01-28 NOTE — Progress Notes (Signed)
Called midlevel to inform of pt's increase in pain and also redness starting to spread up pt's left leg. Awaiting call back.

## 2014-01-29 DIAGNOSIS — A419 Sepsis, unspecified organism: Secondary | ICD-10-CM | POA: Diagnosis present

## 2014-01-29 LAB — CBC
HCT: 42.2 % (ref 39.0–52.0)
Hemoglobin: 14.2 g/dL (ref 13.0–17.0)
MCH: 31.8 pg (ref 26.0–34.0)
MCHC: 33.6 g/dL (ref 30.0–36.0)
MCV: 94.4 fL (ref 78.0–100.0)
PLATELETS: 174 10*3/uL (ref 150–400)
RBC: 4.47 MIL/uL (ref 4.22–5.81)
RDW: 13.8 % (ref 11.5–15.5)
WBC: 25.4 10*3/uL — ABNORMAL HIGH (ref 4.0–10.5)

## 2014-01-29 MED ORDER — OXYCODONE-ACETAMINOPHEN 5-325 MG PO TABS
2.0000 | ORAL_TABLET | ORAL | Status: DC | PRN
  Administered 2014-01-29 – 2014-02-06 (×35): 2 via ORAL
  Filled 2014-01-29 (×35): qty 2

## 2014-01-29 MED ORDER — TRAZODONE HCL 100 MG PO TABS
100.0000 mg | ORAL_TABLET | Freq: Every evening | ORAL | Status: DC | PRN
Start: 2014-01-29 — End: 2014-02-06
  Administered 2014-01-29 – 2014-02-04 (×6): 100 mg via ORAL
  Filled 2014-01-29 (×18): qty 1

## 2014-01-29 MED ORDER — DEXTROSE 5 % IV SOLN
1.0000 g | INTRAVENOUS | Status: DC
  Administered 2014-01-29 – 2014-02-02 (×5): 1 g via INTRAVENOUS
  Filled 2014-01-29 (×6): qty 10

## 2014-01-29 MED ORDER — LORAZEPAM 1 MG PO TABS
1.0000 mg | ORAL_TABLET | Freq: Four times a day (QID) | ORAL | Status: DC | PRN
  Administered 2014-01-30 – 2014-02-06 (×22): 1 mg via ORAL
  Filled 2014-01-29 (×23): qty 1

## 2014-01-29 NOTE — Progress Notes (Signed)
[facility] PROGRESS NOTE  Assessment/Plan: Sepsis due to Cellulitis: -  IV vanc, worse today add rocephin (allergy tp PCN is rash), repeat CBC in am. - blood culture pending. - afebrile overnight.  Mood disorder in conditions classified elsewhere: - cont sertraline.  Polysubstance (including opioids) dependence with physiological dependence - avoid narcotics. - uds negative.    Code Status: full Family Communication: none  Disposition Plan: inpatient   Consultants:  none  Procedures:    Antibiotics:  Vancomycin 4.9.2015  HPI/Subjective: complaining of left leg pain  Objective: Filed Vitals:   01/28/14 1038 01/28/14 1402 01/28/14 1920 01/29/14 0500  BP: 97/59 122/75 117/71 113/69  Pulse: 86 84 78 89  Temp: 98 F (36.7 C) 98 F (36.7 C) 97.8 F (36.6 C) 98.2 F (36.8 C)  TempSrc: Oral Oral Oral Oral  Resp: 18 18 20 18   Height:      Weight:      SpO2: 93% 94% 96% 93%    Intake/Output Summary (Last 24 hours) at 01/29/14 1017 Last data filed at 01/29/14 0834  Gross per 24 hour  Intake   1940 ml  Output   3650 ml  Net  -1710 ml   Filed Weights   01/27/14 1933 01/28/14 0230  Weight: 99.791 kg (220 lb) 104.6 kg (230 lb 9.6 oz)    Exam:  General: Alert, awake, oriented x3, in no acute distress.  HEENT: No bruits, no goiter.  Heart: Regular rate and rhythm,  Lungs: Good air movement, clear.  Abdomen: Soft, nontender, nondistended, positive bowel sounds.  Skin: red lower ext erythematous. And tender. Now with erythema in the upper thigh. I have marked it.    Data Reviewed: Basic Metabolic Panel:  Recent Labs Lab 01/28/14 0022  NA 134*  K 4.6  CL 98  CO2 24  GLUCOSE 83  BUN 13  CREATININE 0.75  CALCIUM 8.9   Liver Function Tests: No results found for this basename: AST, ALT, ALKPHOS, BILITOT, PROT, ALBUMIN,  in the last 168 hours No results found for this basename: LIPASE, AMYLASE,  in the last 168 hours No results found  for this basename: AMMONIA,  in the last 168 hours CBC:  Recent Labs Lab 01/27/14 2314 01/29/14 0503  WBC 36.2* 25.4*  NEUTROABS 32.9*  --   HGB 15.3 14.2  HCT 43.9 42.2  MCV 94.8 94.4  PLT 234 174   Cardiac Enzymes: No results found for this basename: CKTOTAL, CKMB, CKMBINDEX, TROPONINI,  in the last 168 hours BNP (last 3 results) No results found for this basename: PROBNP,  in the last 8760 hours CBG: No results found for this basename: GLUCAP,  in the last 168 hours  Recent Results (from the past 240 hour(s))  CULTURE, BLOOD (ROUTINE X 2)     Status: None   Collection Time    01/28/14 12:00 PM      Result Value Ref Range Status   Specimen Description BLOOD LEFT ARM   Final   Special Requests BOTTLES DRAWN AEROBIC AND ANAEROBIC 5CC   Final   Culture  Setup Time     Final   Value: 01/28/2014 14:07     [facility]   Culture     Final   Value:        BLOOD CULTURE RECEIVED NO GROWTH TO DATE CULTURE WILL BE HELD FOR 5 DAYS BEFORE ISSUING A FINAL NEGATIVE REPORT     Performed at [facility]   Report  Status PENDING   Incomplete  CULTURE, BLOOD (ROUTINE X 2)     Status: None   Collection Time    01/28/14 12:10 PM      Result Value Ref Range Status   Specimen Description BLOOD LEFT HAND   Final   Special Requests BOTTLES DRAWN AEROBIC AND ANAEROBIC 5CC   Final   Culture  Setup Time     Final   Value: 01/28/2014 14:07     Performed at [facility]   Culture     Final   Value:        BLOOD CULTURE RECEIVED NO GROWTH TO DATE CULTURE WILL BE HELD FOR 5 DAYS BEFORE ISSUING A FINAL NEGATIVE REPORT     Performed at [facility]   Report Status PENDING   Incomplete     Studies: No results found.  Scheduled Meds: . gabapentin  600 mg Oral TID  . nicotine  21 mg Transdermal Daily  . sertraline  150 mg Oral Daily  . sodium chloride  3 mL Intravenous Q12H  . traZODone  150 mg Oral QHS,MR X 1  . vancomycin  1,000 mg Intravenous  Q8H   Continuous Infusions:    Marinda Elk  [facility] Pager [phone]. If 8PM-8AM, please contact night-coverage at [email or web address], password [facility] 01/29/2014, 10:17 AM  LOS: 2 days

## 2014-01-30 DIAGNOSIS — L03119 Cellulitis of unspecified part of limb: Secondary | ICD-10-CM

## 2014-01-30 DIAGNOSIS — L03116 Cellulitis of left lower limb: Secondary | ICD-10-CM | POA: Diagnosis present

## 2014-01-30 DIAGNOSIS — L02419 Cutaneous abscess of limb, unspecified: Secondary | ICD-10-CM

## 2014-01-30 LAB — MRSA PCR SCREENING: MRSA by PCR: INVALID — AB

## 2014-01-30 LAB — CREATININE, SERUM
CREATININE: 0.61 mg/dL (ref 0.50–1.35)
GFR calc Af Amer: >90 mL/min (ref >90)
GFR calc non Af Amer: >90 mL/min (ref >90)

## 2014-01-30 LAB — VANCOMYCIN, TROUGH: VANCOMYCIN TR: 5.6 ug/mL — AB (ref 10.0–20.0)

## 2014-01-30 MED ORDER — METRONIDAZOLE IN NACL 5-0.79 MG/ML-% IV SOLN
500.0000 mg | Freq: Three times a day (TID) | INTRAVENOUS | Status: DC
  Administered 2014-01-30 – 2014-02-03 (×12): 500 mg via INTRAVENOUS
  Filled 2014-01-30 (×13): qty 100

## 2014-01-30 MED ORDER — OXYCODONE HCL 5 MG PO TABS
5.0000 mg | ORAL_TABLET | Freq: Once | ORAL | Status: AC
  Administered 2014-01-30: 5 mg via ORAL
  Filled 2014-01-30: qty 1

## 2014-01-30 MED ORDER — ENOXAPARIN SODIUM 40 MG/0.4ML ~~LOC~~ SOLN
40.0000 mg | SUBCUTANEOUS | Status: DC
  Administered 2014-01-30 – 2014-02-05 (×7): 40 mg via SUBCUTANEOUS
  Filled 2014-01-30 (×8): qty 0.4

## 2014-01-30 MED ORDER — VANCOMYCIN HCL 10 G IV SOLR
1250.0000 mg | Freq: Three times a day (TID) | INTRAVENOUS | Status: DC
  Administered 2014-01-30 – 2014-02-05 (×19): 1250 mg via INTRAVENOUS
  Filled 2014-01-30 (×21): qty 1250

## 2014-01-30 MED ORDER — HYDROMORPHONE HCL PF 1 MG/ML IJ SOLN
1.0000 mg | Freq: Four times a day (QID) | INTRAMUSCULAR | Status: DC | PRN
  Administered 2014-01-30 (×2): 1 mg via INTRAVENOUS
  Administered 2014-01-30 – 2014-02-02 (×10): 2 mg via INTRAVENOUS
  Filled 2014-01-30 (×2): qty 2
  Filled 2014-01-30: qty 1
  Filled 2014-01-30 (×9): qty 2

## 2014-01-30 NOTE — Progress Notes (Signed)
ANTIBIOTIC CONSULT NOTE -Follow up  Pharmacy Consult for vancomycin Indication: Cellulitis  Allergies  Allergen Reactions  . Lithium Anaphylaxis  . Penicillins Rash   Patient Measurements: Height: 5\' 9"  (175.3 cm) Weight: 230 lb 9.6 oz (104.6 kg) IBW/kg (Calculated) : 70.7  Vital Signs: Temp: 99.2 F (37.3 C) (04/11 1350) Temp src: Oral (04/11 1350) BP: 136/73 mmHg (04/11 1350) Pulse Rate: 85 (04/11 1350) Intake/Output from previous day: 04/10 0701 - 04/11 0700 In: 1415 [P.O.:660; I.V.:505; IV Piggyback:250] Out: 4725 [Urine:4725] Intake/Output from this shift: Total I/O In: 600 [P.O.:600] Out: 1800 [Urine:1800]  Labs:  Recent Labs  01/27/14 2314 01/28/14 0022 01/29/14 0503 01/30/14 0523  WBC 36.2*  --  25.4*  --   HGB 15.3  --  14.2  --   PLT 234  --  174  --   CREATININE  --  0.75  --  0.61   Estimated Creatinine Clearance: 153.7 ml/min (by C-G formula based on Cr of 0.61).  Recent Labs  01/30/14 1333  VANCOTROUGH 5.6*    Microbiology: Recent Results (from the past 720 hour(s))  CULTURE, BLOOD (ROUTINE X 2)     Status: None   Collection Time    01/28/14 12:00 PM      Result Value Ref Range Status   Specimen Description BLOOD LEFT ARM   Final   Special Requests BOTTLES DRAWN AEROBIC AND ANAEROBIC 5CC   Final   Culture  Setup Time     Final   Value: 01/28/2014 14:07     Performed at [facility]   Culture     Final   Value:        BLOOD CULTURE RECEIVED NO GROWTH TO DATE CULTURE WILL BE HELD FOR 5 DAYS BEFORE ISSUING A FINAL NEGATIVE REPORT     Performed at [facility]   Report Status PENDING   Incomplete  CULTURE, BLOOD (ROUTINE X 2)     Status: None   Collection Time    01/28/14 12:10 PM      Result Value Ref Range Status   Specimen Description BLOOD LEFT HAND   Final   Special Requests BOTTLES DRAWN AEROBIC AND ANAEROBIC 5CC   Final   Culture  Setup Time     Final   Value: 01/28/2014 14:07     [facility]formed [facility]   Culture     Final   Value:        BLOOD CULTURE RECEIVED NO GROWTH TO DATE CULTURE WILL BE HELD FOR 5 DAYS BEFORE ISSUING A FINAL NEGATIVE REPORT     Performed at [facility]   Report Status PENDING   Incomplete  MRSA PCR SCREENING     Status: Abnormal   Collection Time    01/29/14  8:16 PM      Result Value Ref Range Status   MRSA by PCR INVALID RESULTS, SPECIMEN SENT FOR CULTURE (*) NEGATIVE Final   Comment: RESULT CALLED TO, READ BACK BY AND VERIFIED WITH:     SWALTON RN AT 0145 ON [id number] BY DLONG                The GeneXpert MRSA Assay (FDA     approved for NASAL specimens     only), is one component of a     comprehensive MRSA colonization     surveillance program. It is not     intended to diagnose MRSA     infection nor to guide or  monitor treatment for     MRSA infections.   Medications:  Anti-infectives   Start     Dose/Rate Route Frequency Ordered Stop   01/30/14 1530  vancomycin (VANCOCIN) 1,250 mg in sodium chloride 0.9 % 250 mL IVPB     1,250 mg 166.7 mL/hr over 90 Minutes Intravenous 3 times per day 01/30/14 1441     01/30/14 1200  metroNIDAZOLE (FLAGYL) IVPB 500 mg     500 mg 100 mL/hr over 60 Minutes Intravenous Every 8 hours 01/30/14 1042     01/29/14 1200  cefTRIAXone (ROCEPHIN) 1 g in dextrose 5 % 50 mL IVPB     1 g 100 mL/hr over 30 Minutes Intravenous Every 24 hours 01/29/14 1019     01/28/14 0600  vancomycin (VANCOCIN) IVPB 1000 mg/200 mL premix  Status:  Discontinued     1,000 mg 200 mL/hr over 60 Minutes Intravenous Every 8 hours 01/28/14 0159 01/30/14 1440   01/27/14 2315  vancomycin (VANCOCIN) IVPB 1000 mg/200 mL premix     1,000 mg 200 mL/hr over 60 Minutes Intravenous  Once 01/27/14 2301 01/28/14 0018     Assessment: 37 yoM with LLE cellulitis, hx of polysubstance abuse, clean for > 4yr. Vancomycin per pharmacy begun 4/9, Rocephin added 4/10, Flagyl added 4/11.  D3 Vancomycin 1gm q8h, trough level 5.6 mcg/ml - below  goal range for cellulitis  WBC 36.2-> 25.4 - still significant leukocytosis. No improvement of rash/redness per RN  Extremely good clearance of Vancomycin  Goal of Therapy:  Vancomycin trough level 10-15 mcg/ml  Plan:   Increase Vancomycin to 1250mg  q8hr  Repeat trough before 5th dose of new dose or sooner if Clearance changes  Otho Bellows PharmD Pager [phone] 01/30/2014, 2:52 PM

## 2014-01-30 NOTE — Progress Notes (Signed)
[facility] PROGRESS NOTE  [name] PennerGary M [name] FAO:[id number] DOB: 1975-10-30 DOA: 01/27/2014  PCP: None  Brief HPI: 38 yo male h/o polysubstance abuse, sober for over one year, presented with a red rash to the left leg that has gotten worse. No injury to this leg. No fevers. No n/v/d. No chills. No sores in this leg. No recent abx. Pt has significant cellulitis to left leg and was admitted for further management.  Past medical history:  Past Medical History  Diagnosis Date  . Bipolar disorder   . Hepatitis C   . Depression     Consultants: None  Procedures: None  Antibiotics: Vanc 4/9--> Ceftriaxone 4/10--> Flagyl 4/11-->  Subjective: Patient complaints of 10/10 pain in left leg. Not much improvement in redness. No other complaints.  Objective: Vital Signs  Filed Vitals:   01/29/14 1312 01/29/14 1810 01/29/14 2218 01/30/14 0553  BP: 103/61 137/67 120/72 120/66  Pulse: 99 83 78 83  Temp: 98.8 F (37.1 C) 98.1 F (36.7 C) 97.3 F (36.3 C) 97.4 F (36.3 C)  TempSrc:  Oral Oral Oral  Resp: 16 18 20 20   Height:      Weight:      SpO2: 95% 96% 93% 97%    Intake/Output Summary (Last 24 hours) at 01/30/14 1052 Last data filed at 01/30/14 [id number]  Gross per 24 hour  Intake   1235 ml  Output   4800 ml  Net  -3565 ml   Filed Weights   01/27/14 1933 01/28/14 0230  Weight: 99.791 kg (220 lb) 104.6 kg (230 lb 9.6 oz)    General appearance: alert, cooperative, appears stated age and no distress Resp: clear to auscultation bilaterally Cardio: regular rate and rhythm, S1, S2 normal, no murmur, click, rub or gallop GI: soft, non-tender; bowel sounds normal; no masses,  no organomegaly Extremities: swelling and erythema involving left leg with extension proximally in lymphangitic pattern. Good DP pulses. No area of fluctuation. Skin: erythema involving left leg. Neurologic: No focal deficits  Lab Results:  Basic Metabolic Panel:  Recent Labs Lab 01/28/14 0022  01/30/14 0523  NA 134*  --   K 4.6  --   CL 98  --   CO2 24  --   GLUCOSE 83  --   BUN 13  --   CREATININE 0.75 0.61  CALCIUM 8.9  --    CBC:  Recent Labs Lab 01/27/14 2314 01/29/14 0503  WBC 36.2* 25.4*  NEUTROABS 32.9*  --   HGB 15.3 14.2  HCT 43.9 42.2  MCV 94.8 94.4  PLT 234 174    Recent Results (from the past 240 hour(s))  CULTURE, BLOOD (ROUTINE X 2)     Status: None   Collection Time    01/28/14 12:00 PM      Result Value Ref Range Status   Specimen Description BLOOD LEFT ARM   Final   Special Requests BOTTLES DRAWN AEROBIC AND ANAEROBIC 5CC   Final   Culture  Setup Time     Final   Value: 01/28/2014 14:07     Performed at [facility]   Culture     Final   Value:        BLOOD CULTURE RECEIVED NO GROWTH TO DATE CULTURE WILL BE HELD FOR 5 DAYS BEFORE ISSUING A FINAL NEGATIVE REPORT     Performed at [facility]   Report Status PENDING   Incomplete  CULTURE, BLOOD (ROUTINE X 2)     Status:  None   Collection Time    01/28/14 12:10 PM      Result Value Ref Range Status   Specimen Description BLOOD LEFT HAND   Final   Special Requests BOTTLES DRAWN AEROBIC AND ANAEROBIC 5CC   Final   Culture  Setup Time     Final   Value: 01/28/2014 14:07     Performed at [facility]   Culture     Final   Value:        BLOOD CULTURE RECEIVED NO GROWTH TO DATE CULTURE WILL BE HELD FOR 5 DAYS BEFORE ISSUING A FINAL NEGATIVE REPORT     Performed at [facility]   Report Status PENDING   Incomplete  MRSA PCR SCREENING     Status: Abnormal   Collection Time    01/29/14  8:16 PM      Result Value Ref Range Status   MRSA by PCR INVALID RESULTS, SPECIMEN SENT FOR CULTURE (*) NEGATIVE Final   Comment: RESULT CALLED TO, READ BACK BY AND VERIFIED WITH:     SWALTON RN AT 0145 ON [id number] BY DLONG                The GeneXpert MRSA Assay (FDA     approved for NASAL specimens     only), is one component of a     comprehensive MRSA colonization      surveillance program. It is not     intended to diagnose MRSA     infection nor to guide or     monitor treatment for     MRSA infections.      Studies/Results: No results found.  Medications:  Scheduled: . cefTRIAXone (ROCEPHIN)  IV  1 g Intravenous Q24H  . enoxaparin (LOVENOX) injection  40 mg Subcutaneous Q24H  . gabapentin  600 mg Oral TID  . metronidazole  500 mg Intravenous Q8H  . nicotine  21 mg Transdermal Daily  . sertraline  150 mg Oral Daily  . sodium chloride  3 mL Intravenous Q12H  . traZODone  100 mg Oral QHS,MR X 1  . vancomycin  1,000 mg Intravenous Q8H   Continuous:  [id number] chloride, acetaminophen, acetaminophen, HYDROmorphone (DILAUDID) injection, ibuprofen, LORazepam, oxyCODONE-acetaminophen, sodium chloride  Assessment/Plan:  Principal Problem:   Cellulitis of leg, left Active Problems:   Polysubstance (including opioids) dependence with physiological dependence   Anxiety state, unspecified   Mood disorder in conditions classified elsewhere   Sepsis    Cellulitis of Left Lower Extremity Patient continues to have significant pain. Not much improvement with Vanc and ceftriaxone. Will add Flagyl to cover anaerobes. His WBC was better yesterday. If doesn't improve in next 24-36 hours, we may need to consider imaging studies. Does have good peripheral pulses. Repeat labs in AM. Bood culture negative so far. Remains afebrile. Emphasized importance of good hygiene, especially in between toes.  Mood disorder in conditions classified elsewhere:  Continue sertraline.   Polysubstance dependence with physiological dependence  This was about 2 years ago and was predominantly alcohol related per patient. Due to severity of pain, will add IV dilaudid for now.   Code Status: Full code  DVT Prophylaxis: Add Lovenox    Family Communication: Discussed in detail with patient.  Disposition Plan: Not ready for discharge    LOS: 3 days   [name] [name]  [facility] Pager [phone] 01/30/2014, 10:52 AM  If 8PM-8AM, please contact night-coverage at [email or web address], password [facility]

## 2014-01-31 LAB — BASIC METABOLIC PANEL
BUN: 8 mg/dL (ref 6–23)
CO2: 26 mEq/L (ref 19–32)
Calcium: 8.6 mg/dL (ref 8.4–10.5)
Chloride: 98 mEq/L (ref 96–112)
Creatinine, Ser: 0.65 mg/dL (ref 0.50–1.35)
GFR calc Af Amer: >90 mL/min (ref >90)
Glucose, Bld: 100 mg/dL — ABNORMAL HIGH (ref 70–99)
POTASSIUM: 4.4 meq/L (ref 3.7–5.3)
SODIUM: 135 meq/L — AB (ref 137–147)

## 2014-01-31 LAB — CBC
HCT: 39.4 % (ref 39.0–52.0)
Hemoglobin: 13.4 g/dL (ref 13.0–17.0)
MCH: 32 pg (ref 26.0–34.0)
MCHC: 34 g/dL (ref 30.0–36.0)
MCV: 94 fL (ref 78.0–100.0)
Platelets: 227 10*3/uL (ref 150–400)
RBC: 4.19 MIL/uL — ABNORMAL LOW (ref 4.22–5.81)
RDW: 13.8 % (ref 11.5–15.5)
WBC: 24.2 10*3/uL — ABNORMAL HIGH (ref 4.0–10.5)

## 2014-01-31 MED ORDER — LIP MEDEX EX OINT
TOPICAL_OINTMENT | CUTANEOUS | Status: DC | PRN
  Filled 2014-01-31 (×2): qty 7

## 2014-01-31 NOTE — Progress Notes (Signed)
[facility] PROGRESS NOTE  [name] PennerGary M Brugh EAV:[id number] DOB: Oct 11, 1976 DOA: 01/27/2014  PCP: None  Brief HPI: 38 yo male h/o polysubstance abuse, sober for over one year, presented with a red rash to the left leg that has gotten worse. No injury to this leg. No fevers. No n/v/d. No chills. No sores in this leg. No recent abx. Pt has significant cellulitis to left leg and was admitted for further management.  Past medical history:  Past Medical History  Diagnosis Date  . Bipolar disorder   . Hepatitis C   . Depression     Consultants: None  Procedures: None  Antibiotics: Vanc 4/9--> Ceftriaxone 4/10--> Flagyl 4/11-->  Subjective: Patient continues to have pain in left leg but improves with narcotics. He doesn't feel much better today.   Objective: Vital Signs  Filed Vitals:   01/30/14 0553 01/30/14 1350 01/30/14 2100 01/31/14 0500  BP: 120/66 136/73 130/85 134/75  Pulse: 83 85 97 90  Temp: 97.4 F (36.3 C) 99.2 F (37.3 C) 98.2 F (36.8 C) 99.8 F (37.7 C)  TempSrc: Oral Oral Oral Oral  Resp: 20 18 16 18   Height:      Weight:      SpO2: 97% 99% 99% 96%    Intake/Output Summary (Last 24 hours) at 01/31/14 1128 Last data filed at 01/31/14 0913  Gross per 24 hour  Intake 2222.33 ml  Output   3300 ml  Net -1077.67 ml   Filed Weights   01/27/14 1933 01/28/14 0230  Weight: 99.791 kg (220 lb) 104.6 kg (230 lb 9.6 oz)    General appearance: alert, cooperative, appears stated age and no distress Resp: clear to auscultation bilaterally Cardio: regular rate and rhythm, S1, S2 normal, no murmur, click, rub or gallop GI: soft, non-tender; bowel sounds normal; no masses,  no organomegaly Extremities: swelling and erythema involving left leg with extension proximally in lymphangitic pattern. Good DP pulses. No area of fluctuation. Erythema seems to be improved at least proximally over thigh area. Persists distally over lower leg. Tender to touch. Skin:  erythema involving left leg. Some skin breakdown noted in leg. Neurologic: No focal deficits  Lab Results:  Basic Metabolic Panel:  Recent Labs Lab 01/28/14 0022 01/30/14 0523 01/31/14 0620  NA 134*  --  135*  K 4.6  --  4.4  CL 98  --  98  CO2 24  --  26  GLUCOSE 83  --  100*  BUN 13  --  8  CREATININE 0.75 0.61 0.65  CALCIUM 8.9  --  8.6   CBC:  Recent Labs Lab 01/27/14 2314 01/29/14 0503 01/31/14 0620  WBC 36.2* 25.4* 24.2*  NEUTROABS 32.9*  --   --   HGB 15.3 14.2 13.4  HCT 43.9 42.2 39.4  MCV 94.8 94.4 94.0  PLT 234 174 227    Recent Results (from the past 240 hour(s))  CULTURE, BLOOD (ROUTINE X 2)     Status: None   Collection Time    01/28/14 12:00 PM      Result Value Ref Range Status   Specimen Description BLOOD LEFT ARM   Final   Special Requests BOTTLES DRAWN AEROBIC AND ANAEROBIC 5CC   Final   Culture  Setup Time     Final   Value: 01/28/2014 14:07     Performed at [facility]   Culture     Final   Value:        BLOOD CULTURE RECEIVED  NO GROWTH TO DATE CULTURE WILL BE HELD FOR 5 DAYS BEFORE ISSUING A FINAL NEGATIVE REPORT     Performed at [facility]   Report Status PENDING   Incomplete  CULTURE, BLOOD (ROUTINE X 2)     Status: None   Collection Time    01/28/14 12:10 PM      Result Value Ref Range Status   Specimen Description BLOOD LEFT HAND   Final   Special Requests BOTTLES DRAWN AEROBIC AND ANAEROBIC 5CC   Final   Culture  Setup Time     Final   Value: 01/28/2014 14:07     Performed at [facility]   Culture     Final   Value:        BLOOD CULTURE RECEIVED NO GROWTH TO DATE CULTURE WILL BE HELD FOR 5 DAYS BEFORE ISSUING A FINAL NEGATIVE REPORT     Performed at [facility]   Report Status PENDING   Incomplete  MRSA PCR SCREENING     Status: Abnormal   Collection Time    01/29/14  8:16 PM      Result Value Ref Range Status   MRSA by PCR INVALID RESULTS, SPECIMEN SENT FOR CULTURE (*) NEGATIVE Final    Comment: RESULT CALLED TO, READ BACK BY AND VERIFIED WITH:     SWALTON RN AT 0145 ON [id number] BY DLONG                The GeneXpert MRSA Assay (FDA     approved for NASAL specimens     only), is one component of a     comprehensive MRSA colonization     surveillance program. It is not     intended to diagnose MRSA     infection nor to guide or     monitor treatment for     MRSA infections.      Studies/Results: No results found.  Medications:  Scheduled: . cefTRIAXone (ROCEPHIN)  IV  1 g Intravenous Q24H  . enoxaparin (LOVENOX) injection  40 mg Subcutaneous Q24H  . gabapentin  600 mg Oral TID  . metronidazole  500 mg Intravenous Q8H  . nicotine  21 mg Transdermal Daily  . sertraline  150 mg Oral Daily  . sodium chloride  3 mL Intravenous Q12H  . traZODone  100 mg Oral QHS,MR X 1  . vancomycin  1,250 mg Intravenous 3 times per day   Continuous:  VWU:JWJXBJ chloride, acetaminophen, acetaminophen, HYDROmorphone (DILAUDID) injection, ibuprofen, LORazepam, oxyCODONE-acetaminophen, sodium chloride  Assessment/Plan:  Principal Problem:   Cellulitis of leg, left Active Problems:   Polysubstance (including opioids) dependence with physiological dependence   Anxiety state, unspecified   Mood disorder in conditions classified elsewhere   Sepsis    Cellulitis of Left Lower Extremity Slow to improve but seems to be better compared to 4/11. Patient continues to have pain but controlled with narcotics. Continue Vanc, ceftriaxone and Flagyl. His WBC is slightly better. Continue to observe for now. Has good pulses. Repeat labs in AM. Bood culture negative so far. Remains afebrile. Emphasized importance of good hygiene, especially in between toes. Keep left leg elevated.  Mood disorder in conditions classified elsewhere:  Continue sertraline.   Polysubstance dependence with physiological dependence  This was about 2 years ago and was predominantly alcohol related per patient. Due to  severity of pain IV dilaudid was added.    Code Status: Full code  DVT Prophylaxis: Lovenox    Family Communication: Discussed  in detail with patient.  Disposition Plan: Not ready for discharge.    LOS: 4 days   Osvaldo Shipper  [facility] Pager [phone] 01/31/2014, 11:28 AM  If 8PM-8AM, please contact night-coverage at [email or web address], password [email or web address]

## 2014-02-01 ENCOUNTER — Other Ambulatory Visit (HOSPITAL_COMMUNITY): Payer: Self-pay

## 2014-02-01 ENCOUNTER — Inpatient Hospital Stay (HOSPITAL_COMMUNITY): Payer: Medicare Other

## 2014-02-01 ENCOUNTER — Encounter (HOSPITAL_COMMUNITY): Payer: Self-pay | Admitting: Radiology

## 2014-02-01 LAB — MRSA CULTURE

## 2014-02-01 LAB — VANCOMYCIN, TROUGH: VANCOMYCIN TR: 9.9 ug/mL — AB (ref 10.0–20.0)

## 2014-02-01 LAB — CBC
HCT: 38.4 % — ABNORMAL LOW (ref 39.0–52.0)
Hemoglobin: 13 g/dL (ref 13.0–17.0)
MCH: 32.1 pg (ref 26.0–34.0)
MCHC: 33.9 g/dL (ref 30.0–36.0)
MCV: 94.8 fL (ref 78.0–100.0)
PLATELETS: 218 10*3/uL (ref 150–400)
RBC: 4.05 MIL/uL — ABNORMAL LOW (ref 4.22–5.81)
RDW: 13.9 % (ref 11.5–15.5)
WBC: 15.7 10*3/uL — AB (ref 4.0–10.5)

## 2014-02-01 MED ORDER — GABAPENTIN 300 MG PO CAPS
600.0000 mg | ORAL_CAPSULE | Freq: Three times a day (TID) | ORAL | Status: DC
  Administered 2014-02-01 – 2014-02-03 (×12): 600 mg via ORAL
  Filled 2014-02-01 (×17): qty 2

## 2014-02-01 MED ORDER — IOHEXOL 300 MG/ML  SOLN
100.0000 mL | Freq: Once | INTRAMUSCULAR | Status: AC | PRN
  Administered 2014-02-01: 100 mL via INTRAVENOUS

## 2014-02-01 NOTE — Progress Notes (Signed)
ANTIBIOTIC CONSULT NOTE   Pharmacy Consult for vancomycin Indication: Cellulitis  Allergies  Allergen Reactions  . Lithium Anaphylaxis  . Penicillins Rash   Patient Measurements: Height: 5\' 9"  (175.3 cm) Weight: 230 lb 9.6 oz (104.6 kg) IBW/kg (Calculated) : 70.7  Vital Signs: Temp: 97.8 F (36.6 C) (04/13 0440) Temp src: Oral (04/13 0440) BP: 122/73 mmHg (04/13 0440) Pulse Rate: 81 (04/13 0440) Intake/Output from previous day: 04/12 0701 - 04/13 0700 In: 2820 [P.O.:1320; IV Piggyback:1500] Out: 3850 [Urine:3850] Intake/Output from this shift: Total I/O In: 250 [IV Piggyback:250] Out: 600 [Urine:600]  Labs:  Recent Labs  01/30/14 0523 01/31/14 0620  WBC  --  24.2*  HGB  --  13.4  PLT  --  227  CREATININE 0.61 0.65   Estimated Creatinine Clearance: 153.7 ml/min (by C-G formula based on Cr of 0.65).  Recent Labs  01/30/14 1333 02/01/14 0545  VANCOTROUGH 5.6* 9.9*    Microbiology: Recent Results (from the past 720 hour(s))  CULTURE, BLOOD (ROUTINE X 2)     Status: None   Collection Time    01/28/14 12:00 PM      Result Value Ref Range Status   Specimen Description BLOOD LEFT ARM   Final   Special Requests BOTTLES DRAWN AEROBIC AND ANAEROBIC 5CC   Final   Culture  Setup Time     Final   Value: 01/28/2014 14:07     Performed at [facility]   Culture     Final   Value:        BLOOD CULTURE RECEIVED NO GROWTH TO DATE CULTURE WILL BE HELD FOR 5 DAYS BEFORE ISSUING A FINAL NEGATIVE REPORT     Performed at [facility]   Report Status PENDING   Incomplete  CULTURE, BLOOD (ROUTINE X 2)     Status: None   Collection Time    01/28/14 12:10 PM      Result Value Ref Range Status   Specimen Description BLOOD LEFT HAND   Final   Special Requests BOTTLES DRAWN AEROBIC AND ANAEROBIC 5CC   Final   Culture  Setup Time     Final   Value: 01/28/2014 14:07     Performed at [facility]   Culture     Final   Value:        BLOOD CULTURE  RECEIVED NO GROWTH TO DATE CULTURE WILL BE HELD FOR 5 DAYS BEFORE ISSUING A FINAL NEGATIVE REPORT     Performed at [facility]   Report Status PENDING   Incomplete  MRSA PCR SCREENING     Status: Abnormal   Collection Time    01/29/14  8:16 PM      Result Value Ref Range Status   MRSA by PCR INVALID RESULTS, SPECIMEN SENT FOR CULTURE (*) NEGATIVE Final   Comment: RESULT CALLED TO, READ BACK BY AND VERIFIED WITH:     SWALTON RN AT 0145 ON [id number] BY DLONG                The GeneXpert MRSA Assay (FDA     approved for NASAL specimens     only), is one component of a     comprehensive MRSA colonization     surveillance program. It is not     intended to diagnose MRSA     infection nor to guide or     monitor treatment for     MRSA infections.  MRSA CULTURE  Status: None   Collection Time    01/29/14  8:16 PM      Result Value Ref Range Status   Specimen Description NOSE   Final   Special Requests NONE   Final   Culture     Final   Value: NO SUSPICIOUS COLONIES, CONTINUING TO HOLD     Performed at [facility]   Report Status PENDING   Incomplete   Medications:  Anti-infectives   Start     Dose/Rate Route Frequency Ordered Stop   01/30/14 1530  vancomycin (VANCOCIN) 1,250 mg in sodium chloride 0.9 % 250 mL IVPB     1,250 mg 166.7 mL/hr over 90 Minutes Intravenous 3 times per day 01/30/14 1441     01/30/14 1200  metroNIDAZOLE (FLAGYL) IVPB 500 mg     500 mg 100 mL/hr over 60 Minutes Intravenous Every 8 hours 01/30/14 1042     01/29/14 1200  cefTRIAXone (ROCEPHIN) 1 g in dextrose 5 % 50 mL IVPB     1 g 100 mL/hr over 30 Minutes Intravenous Every 24 hours 01/29/14 1019     01/28/14 0600  vancomycin (VANCOCIN) IVPB 1000 mg/200 mL premix  Status:  Discontinued     1,000 mg 200 mL/hr over 60 Minutes Intravenous Every 8 hours 01/28/14 0159 01/30/14 1440   01/27/14 2315  vancomycin (VANCOCIN) IVPB 1000 mg/200 mL premix     1,000 mg 200 mL/hr over 60 Minutes  Intravenous  Once 01/27/14 2301 01/28/14 0018     Assessment: 37 yoM with LLE cellulitis, hx of polysubstance abuse, clean for > 6542yr. Vancomycin per pharmacy begun 4/9, ceftriaxone added 4/10, Flagyl added 4/11.  On 4/11 vancomycin was increased from 1g q8h to 1250mg  q8h based on trough level. 4/13:  D5 vancomycin, now 1250 mg IV q8h.  D4 ceftriaxone 1 g IV q24h, D3 Flagyl 500 mg IV q8h 1gm q8h  Vancomycin trough today 9.9 - at lower end of therapeutic range.  Anticipate level will continue to rise with further drug accumulation.  WBC slowly improving as of 4/12  SCr stable as of 4/12.  CT ordered by MD to rule out abscess or other process given very slow improvement.  Goal of Therapy:  Vancomycin trough level 10-15 mcg/ml  Plan:   Continue vancomycin at present dosage (1250 mg IV q8h)  Continue ceftriaxone and Flagyl as ordered by MD.  Follow serum creatinine, clinical course.  F/U on findings from CT.  Elie Goodyandy Petrice Beedy, PharmD, BCPS Pager: [phone] 02/01/2014  8:02 AM

## 2014-02-01 NOTE — Progress Notes (Signed)
[facility] PROGRESS NOTE  Noelle PennerGary M Gallen YQM:[id number] DOB: 1976/02/15 DOA: 01/27/2014  PCP: None  Brief HPI: 38 yo male h/o polysubstance abuse, sober for over one year, presented with a red rash to the left leg that has gotten worse. No injury to this leg. No fevers. No n/v/d. No chills. No sores in this leg. No recent abx. Pt has significant cellulitis to left leg and was admitted for further management.  Past medical history:  Past Medical History  Diagnosis Date  . Bipolar disorder   . Hepatitis C   . Depression     Consultants: None  Procedures: None  Antibiotics: Vanc 4/9--> Ceftriaxone 4/10--> Flagyl 4/11-->  Subjective: Patient reports not much change in leg. Continues to have pain. Difficulty with ambulation.   Objective: Vital Signs  Filed Vitals:   01/31/14 0500 01/31/14 1502 01/31/14 2020 02/01/14 0440  BP: 134/75 131/75 145/80 122/73  Pulse: 90 100 105 81  Temp: 99.8 F (37.7 C) 98.4 F (36.9 C) 98.3 F (36.8 C) 97.8 F (36.6 C)  TempSrc: Oral Oral Oral Oral  Resp: 18 18 16 16   Height:      Weight:      SpO2: 96% 98% 94% 94%    Intake/Output Summary (Last 24 hours) at 02/01/14 0723 Last data filed at 02/01/14 [id number]  Gross per 24 hour  Intake   3070 ml  Output   4450 ml  Net  -1380 ml   Filed Weights   01/27/14 1933 01/28/14 0230  Weight: 99.791 kg (220 lb) 104.6 kg (230 lb 9.6 oz)    General appearance: alert, cooperative, appears stated age and no distress Resp: clear to auscultation bilaterally Cardio: regular rate and rhythm, S1, S2 normal, no murmur, click, rub or gallop GI: soft, non-tender; bowel sounds normal; no masses,  no organomegaly Extremities: swelling and erythema involving left leg with extension proximally in lymphangitic pattern. Good DP pulses. No area of fluctuation. Erythema seems to be slightly improved. Persists distally over lower leg. Tender to touch. Skin: erythema involving left leg. Some skin breakdown  noted in lower leg and foot. Neurologic: No focal deficits  Lab Results:  Basic Metabolic Panel:  Recent Labs Lab 01/28/14 0022 01/30/14 0523 01/31/14 0620  NA 134*  --  135*  K 4.6  --  4.4  CL 98  --  98  CO2 24  --  26  GLUCOSE 83  --  100*  BUN 13  --  8  CREATININE 0.75 0.61 0.65  CALCIUM 8.9  --  8.6   CBC:  Recent Labs Lab 01/27/14 2314 01/29/14 0503 01/31/14 0620  WBC 36.2* 25.4* 24.2*  NEUTROABS 32.9*  --   --   HGB 15.3 14.2 13.4  HCT 43.9 42.2 39.4  MCV 94.8 94.4 94.0  PLT 234 174 227    Recent Results (from the past 240 hour(s))  CULTURE, BLOOD (ROUTINE X 2)     Status: None   Collection Time    01/28/14 12:00 PM      Result Value Ref Range Status   Specimen Description BLOOD LEFT ARM   Final   Special Requests BOTTLES DRAWN AEROBIC AND ANAEROBIC 5CC   Final   Culture  Setup Time     Final   Value: 01/28/2014 14:07     Performed at [facility]   Culture     Final   Value:        BLOOD CULTURE RECEIVED NO GROWTH TO  DATE CULTURE WILL BE HELD FOR 5 DAYS BEFORE ISSUING A FINAL NEGATIVE REPORT     Performed at [facility]   Report Status PENDING   Incomplete  CULTURE, BLOOD (ROUTINE X 2)     Status: None   Collection Time    01/28/14 12:10 PM      Result Value Ref Range Status   Specimen Description BLOOD LEFT HAND   Final   Special Requests BOTTLES DRAWN AEROBIC AND ANAEROBIC 5CC   Final   Culture  Setup Time     Final   Value: 01/28/2014 14:07     Performed at [facility]   Culture     Final   Value:        BLOOD CULTURE RECEIVED NO GROWTH TO DATE CULTURE WILL BE HELD FOR 5 DAYS BEFORE ISSUING A FINAL NEGATIVE REPORT     Performed at [facility]   Report Status PENDING   Incomplete  MRSA PCR SCREENING     Status: Abnormal   Collection Time    01/29/14  8:16 PM      Result Value Ref Range Status   MRSA by PCR INVALID RESULTS, SPECIMEN SENT FOR CULTURE (*) NEGATIVE Final   Comment: RESULT CALLED TO, READ  BACK BY AND VERIFIED WITH:     SWALTON RN AT 0145 ON [id number] BY DLONG                The GeneXpert MRSA Assay (FDA     approved for NASAL specimens     only), is one component of a     comprehensive MRSA colonization     surveillance program. It is not     intended to diagnose MRSA     infection nor to guide or     monitor treatment for     MRSA infections.  MRSA CULTURE     Status: None   Collection Time    01/29/14  8:16 PM      Result Value Ref Range Status   Specimen Description NOSE   Final   Special Requests NONE   Final   Culture     Final   Value: NO SUSPICIOUS COLONIES, CONTINUING TO HOLD     Performed at [facility]   Report Status PENDING   Incomplete      Studies/Results: No results found.  Medications:  Scheduled: . cefTRIAXone (ROCEPHIN)  IV  1 g Intravenous Q24H  . enoxaparin (LOVENOX) injection  40 mg Subcutaneous Q24H  . gabapentin  600 mg Oral TID  . metronidazole  500 mg Intravenous Q8H  . nicotine  21 mg Transdermal Daily  . sertraline  150 mg Oral Daily  . sodium chloride  3 mL Intravenous Q12H  . traZODone  100 mg Oral QHS,MR X 1  . vancomycin  1,250 mg Intravenous 3 times per day   Continuous:  VWU:JWJXBJ chloride, acetaminophen, acetaminophen, HYDROmorphone (DILAUDID) injection, ibuprofen, lip balm, LORazepam, oxyCODONE-acetaminophen, sodium chloride  Assessment/Plan:  Principal Problem:   Cellulitis of leg, left Active Problems:   Polysubstance (including opioids) dependence with physiological dependence   Anxiety state, unspecified   Mood disorder in conditions classified elsewhere   Sepsis    Cellulitis of Left Lower Extremity Very slow to improve. Since swelling and erythema persists, we will proceed with CT to look for other processes such as abscess.  Patient continues to have pain but controlled with narcotics. Continue Vanc, ceftriaxone and Flagyl. His WBC is slightly  better. Recheck today. Has good pulses. Bood culture  negative so far. Remains afebrile. Emphasized importance of good hygiene, especially in between toes. Keep left leg elevated.  Mood disorder in conditions classified elsewhere:  Continue sertraline. Patient reports taking Gabapentin 4 times daily instead of three times daily. Will change.  Polysubstance dependence with physiological dependence  This was about 2 years ago and was predominantly alcohol related per patient. Due to severity of pain IV dilaudid was added.    Code Status: Full code  DVT Prophylaxis: Lovenox    Family Communication: Discussed in detail with patient.  Disposition Plan: Not ready for discharge.    LOS: 5 days   Osvaldo ShipperGokul Jaloni Davoli  [facility] Pager [phone] 02/01/2014, 7:23 AM  If 8PM-8AM, please contact night-coverage at [email or web address], password [email or web address]

## 2014-02-01 NOTE — Progress Notes (Addendum)
CARE MANAGEMENT NOTE 02/01/2014  Patient:  Noelle PennerCRUMP,Ardell M   Account Number:  [id number]  Date Initiated:  02/01/2014  Documentation initiated by:  DAVIS,RHONDA  Subjective/Objective Assessment:   first day patient seen by this RN.  Patient with severe cellulitis of the left leg that started out as a rash and then progressed.     Action/Plan:   home when stable   Anticipated DC Date:  02/04/2014   Anticipated DC Plan:  HOME/SELF CARE  In-house referral  NA      DC Planning Services  NA      PAC Choice  NA   Choice offered to / List presented to:  NA   DME arranged  NA      DME agency  NA     HH arranged  NA      HH agency  NA   Status of service:  In process, will continue to follow Medicare Important Message given?  NA - LOS <3 / Initial given by admissions (If response is "NO", the following Medicare IM given date fields will be blank) Date Medicare IM given:   Date Additional Medicare IM given:    Discharge Disposition:    Per UR Regulation:  Reviewed for med. necessity/level of care/duration of stay  If discussed at Long Length of Stay Meetings, dates discussed:    Comments:  [id number]/Rhonda Stark JockDavis, RN, BSN, [place] [phone] Chart Reviewed for discharge and hospital needs. Discharge needs at time of review: None present will follow for needs. Review of patient progress due on [id number].

## 2014-02-02 DIAGNOSIS — M7989 Other specified soft tissue disorders: Secondary | ICD-10-CM

## 2014-02-02 LAB — BASIC METABOLIC PANEL
BUN: 10 mg/dL (ref 6–23)
CHLORIDE: 103 meq/L (ref 96–112)
CO2: 29 mEq/L (ref 19–32)
Calcium: 8.7 mg/dL (ref 8.4–10.5)
Creatinine, Ser: 0.72 mg/dL (ref 0.50–1.35)
GFR calc non Af Amer: >90 mL/min (ref >90)
GLUCOSE: 108 mg/dL — AB (ref 70–99)
POTASSIUM: 4.2 meq/L (ref 3.7–5.3)
Sodium: 140 mEq/L (ref 137–147)

## 2014-02-02 LAB — CBC
HEMATOCRIT: 38.2 % — AB (ref 39.0–52.0)
HEMOGLOBIN: 13 g/dL (ref 13.0–17.0)
MCH: 31.6 pg (ref 26.0–34.0)
MCHC: 34 g/dL (ref 30.0–36.0)
MCV: 92.9 fL (ref 78.0–100.0)
Platelets: 239 10*3/uL (ref 150–400)
RBC: 4.11 MIL/uL — ABNORMAL LOW (ref 4.22–5.81)
RDW: 13.9 % (ref 11.5–15.5)
WBC: 13.5 10*3/uL — AB (ref 4.0–10.5)

## 2014-02-02 MED ORDER — SODIUM CHLORIDE 0.9 % IJ SOLN
10.0000 mL | INTRAMUSCULAR | Status: DC | PRN
  Administered 2014-02-03: 10 mL

## 2014-02-02 MED ORDER — HYDROMORPHONE HCL PF 1 MG/ML IJ SOLN
1.0000 mg | INTRAMUSCULAR | Status: DC | PRN
  Administered 2014-02-02 (×2): 2 mg via INTRAVENOUS
  Administered 2014-02-02: 1 mg via INTRAVENOUS
  Administered 2014-02-03 – 2014-02-06 (×15): 2 mg via INTRAVENOUS
  Administered 2014-02-06: 1 mg via INTRAVENOUS
  Filled 2014-02-02 (×19): qty 2

## 2014-02-02 NOTE — Progress Notes (Signed)
Peripherally Inserted Central Catheter/Midline Placement  The IV Nurse has discussed with the patient and/or persons authorized to consent for the patient, the purpose of this procedure and the potential benefits and risks involved with this procedure.  The benefits include less needle sticks, lab draws from the catheter and patient may be discharged home with the catheter.  Risks include, but not limited to, infection, bleeding, blood clot (thrombus formation), and puncture of an artery; nerve damage and irregular heat beat.  Alternatives to this procedure were also discussed.  PICC/Midline Placement Documentation  PICC / Midline Single Lumen 02/02/14 PICC Right Basilic 47 cm 2 cm (Active)  Indication for Insertion or Continuance of Line Poor Vasculature-patient has had multiple peripheral attempts or PIVs lasting less than 24 hours 02/02/2014 11:00 AM  Exposed Catheter (cm) 2 cm 02/02/2014 11:00 AM  Dressing Change Due 02/09/14 02/02/2014 11:00 AM       Shaylon Gillean Horton Chelsi Warr 02/02/2014, 11:00 AM

## 2014-02-02 NOTE — Progress Notes (Signed)
Dear Doctor:  This patient has been identified as a candidate for PICC for the following reason (s): drug pH or osmolality (causing phlebitis, infiltration in 24 hours) If you agree, please write an order for the indicated device. For any questions contact the Vascular Access Team at [phone] if no answer, please leave a message.  Thank you for supporting the early vascular access assessment program. 

## 2014-02-02 NOTE — Progress Notes (Signed)
*  PRELIMINARY RESULTS* Vascular Ultrasound Left lower extremity venous duplex has been completed.  Preliminary findings: negative for DVT. Enlarged left inguinal lymph nodes noted.   Glendale ChardJill I Eunice  RVT  02/02/2014, 8:58 AM

## 2014-02-02 NOTE — Consult Note (Signed)
WOC wound consult note Reason for Consult:Celulitis of LLE, erythema and skin peeling on pretibial region, dorsal foot. Wound type: infectious Pressure Ulcer POA: No Measurement: Area is outlined with skin marking pen by ED staff.  Noted is skin splitting behind toes 2-4 at plantar surface, suspect this is different etiology than cellulitis, perhaps tinea pedis.  No open wounds, but a focussed area of peeling skin is noted in the pretibial region measuring 10cm x 8cm and to the dorsal foot measuring 4cm x 5cm. Wound bed:no open wounds Drainage (amount, consistency, odor) no drainage Periwound:erythematous, edematous Dressing procedure/placement/frequency: Patient is elevating limb and receiving systemic antibiotics.  I will add a silver hydrofiber to the base of the posterior toes #2-4 and woven between the same digits as well as a xeroform gauze covering to the pretibial area.  I will ask bedside RN to wrap from toe to knee to both secure the toe dressings and the pretibial dressing but to protect the affected LE from inadvertent trauma.  Wound care it to be performed daily. WOC nursing team will not follow, but will remain available to this patient, the nursing and medical teams.  Please re-consult if needed. Thanks, Ladona MowLaurie Whit Bruni, MSN, RN, GNP, [place], CWON-AP [phone])

## 2014-02-02 NOTE — Progress Notes (Signed)
[facility] PROGRESS NOTE  [name] [name] WUJ:[id number] DOB: 1976/07/02 DOA: 01/27/2014  PCP: None  Brief HPI: 38 yo male h/o polysubstance abuse, sober for over one year, presented with a red rash to the left leg that has gotten worse. No injury to this leg. No fevers. No n/v/d. No chills. No sores in this leg. No recent abx. Pt has significant cellulitis to left leg and was admitted for further management.  Past medical history:  Past Medical History  Diagnosis Date  . Bipolar disorder   . Hepatitis C   . Depression     Consultants: None  Procedures: None  Antibiotics: Vanc 4/9--> Ceftriaxone 4/10--> Flagyl 4/11-->  Subjective: Patient continues to have significant pain. Still 10/10. Denies any other symptoms. Redness is better.   Objective: Vital Signs  Filed Vitals:   02/01/14 0440 02/01/14 1346 02/01/14 2238 02/02/14 0544  BP: 122/73 129/78 144/76 126/81  Pulse: 81 94 100 84  Temp: 97.8 F (36.6 C) 98.3 F (36.8 C) 98 F (36.7 C) 98 F (36.7 C)  TempSrc: Oral Tympanic Oral   Resp: 16 16 17 18   Height:      Weight:      SpO2: 94% 93% 94% 96%    Intake/Output Summary (Last 24 hours) at 02/02/14 [id number] Last data filed at 02/02/14 0117  Gross per 24 hour  Intake    960 ml  Output   1400 ml  Net   -440 ml   Filed Weights   01/27/14 1933 01/28/14 0230  Weight: 99.791 kg (220 lb) 104.6 kg (230 lb 9.6 oz)    General appearance: alert, cooperative, appears stated age and no distress Resp: clear to auscultation bilaterally Cardio: regular rate and rhythm, S1, S2 normal, no murmur, click, rub or gallop GI: soft, non-tender; bowel sounds normal; no masses,  no organomegaly Extremities: swelling and erythema involving left leg with extension proximally in lymphangitic pattern. Good DP pulses. No area of fluctuation. Erythema seems to be slightly improved. Tender to touch. Skin: erythema involving left leg is better. Some skin breakdown and peeling is  noted in lower leg and foot. Neurologic: No focal deficits  Lab Results:  Basic Metabolic Panel:  Recent Labs Lab 01/28/14 0022 01/30/14 0523 01/31/14 0620 02/02/14 0410  NA 134*  --  135* 140  K 4.6  --  4.4 4.2  CL 98  --  98 103  CO2 24  --  26 29  GLUCOSE 83  --  100* 108*  BUN 13  --  8 10  CREATININE 0.75 0.61 0.65 0.72  CALCIUM 8.9  --  8.6 8.7   CBC:  Recent Labs Lab 01/27/14 2314 01/29/14 0503 01/31/14 0620 02/01/14 0545 02/02/14 0410  WBC 36.2* 25.4* 24.2* 15.7* 13.5*  NEUTROABS 32.9*  --   --   --   --   HGB 15.3 14.2 13.4 13.0 13.0  HCT 43.9 42.2 39.4 38.4* 38.2*  MCV 94.8 94.4 94.0 94.8 92.9  PLT 234 174 227 218 239    Recent Results (from the past 240 hour(s))  CULTURE, BLOOD (ROUTINE X 2)     Status: None   Collection Time    01/28/14 12:00 PM      Result Value Ref Range Status   Specimen Description BLOOD LEFT ARM   Final   Special Requests BOTTLES DRAWN AEROBIC AND ANAEROBIC 5CC   Final   Culture  Setup Time     Final   Value: 01/28/2014 14:07  Performed at [facility]     Final   Value:        BLOOD CULTURE RECEIVED NO GROWTH TO DATE CULTURE WILL BE HELD FOR 5 DAYS BEFORE ISSUING A FINAL NEGATIVE REPORT     Performed at [facility]   Report Status PENDING   Incomplete  CULTURE, BLOOD (ROUTINE X 2)     Status: None   Collection Time    01/28/14 12:10 PM      Result Value Ref Range Status   Specimen Description BLOOD LEFT HAND   Final   Special Requests BOTTLES DRAWN AEROBIC AND ANAEROBIC 5CC   Final   Culture  Setup Time     Final   Value: 01/28/2014 14:07     Performed at [facility]   Culture     Final   Value:        BLOOD CULTURE RECEIVED NO GROWTH TO DATE CULTURE WILL BE HELD FOR 5 DAYS BEFORE ISSUING A FINAL NEGATIVE REPORT     Performed at [facility]   Report Status PENDING   Incomplete  MRSA PCR SCREENING     Status: Abnormal   Collection Time    01/29/14  8:16 PM       Result Value Ref Range Status   MRSA by PCR INVALID RESULTS, SPECIMEN SENT FOR CULTURE (*) NEGATIVE Final   Comment: RESULT CALLED TO, READ BACK BY AND VERIFIED WITH:     SWALTON RN AT 0145 ON [id number] BY DLONG                The GeneXpert MRSA Assay (FDA     approved for NASAL specimens     only), is one component of a     comprehensive MRSA colonization     surveillance program. It is not     intended to diagnose MRSA     infection nor to guide or     monitor treatment for     MRSA infections.  MRSA CULTURE     Status: None   Collection Time    01/29/14  8:16 PM      Result Value Ref Range Status   Specimen Description NOSE   Final   Special Requests NONE   Final   Culture     Final   Value: NO STAPHYLOCOCCUS AUREUS ISOLATED     Note: No MRSA Isolated     Performed at [facility]   Report Status 02/01/2014 FINAL   Final      Studies/Results: Ct Tibia Fibula Left W Contrast  02/01/2014   CLINICAL DATA:  Left leg cellulitis.  Sepsis.  EXAM: CT OF THE LEFT TIBIA AND FIBULA WITH CONTRAST; CT OF THE LEFT FOOT WITH CONTRAST  TECHNIQUE: Multidetector CT imaging was performed following the standard protocol during bolus administration of intravenous contrast.  CONTRAST:  OMNIPAQUE IOHEXOL 300 MG/ML  SOLN  COMPARISON:  None.  FINDINGS: CT tibia/fibula: Trace knee joint effusion without significant synovial enhancement. Subcutaneous edema extends from the distal thigh to the foot, with associated skin thickening particularly anteriorly, medially, and laterally. Subcutaneous edema tracks along the superficial fascia margin but no abscess is observed, and no definite muscular involvement is identified. No CT findings of osteomyelitis.  CT foot: No bony destructive findings characteristic of osteomyelitis. Subcutaneous edema tracks overlying the malleoli and within the dorsum of the foot. No drainable abscess observed. No definite muscular involvement. No malalignment at the  Lisfranc joint.  Plantar and Achilles calcaneal spurs noted.  IMPRESSION: 1. Subcutaneous edema tracking in the calf and foot as detailed above, without discrete abscess, definite muscular involvement, or bony destructive findings characteristic of osteomyelitis.   Electronically Signed   By: Herbie BaltimoreWalt  Liebkemann M.D.   On: 02/01/2014 12:24   Ct Foot Left W Contrast  02/01/2014   CLINICAL DATA:  Left leg cellulitis.  Sepsis.  EXAM: CT OF THE LEFT TIBIA AND FIBULA WITH CONTRAST; CT OF THE LEFT FOOT WITH CONTRAST  TECHNIQUE: Multidetector CT imaging was performed following the standard protocol during bolus administration of intravenous contrast.  CONTRAST:  100mL OMNIPAQUE IOHEXOL 300 MG/ML  SOLN  COMPARISON:  None.  FINDINGS: CT tibia/fibula: Trace knee joint effusion without significant synovial enhancement. Subcutaneous edema extends from the distal thigh to the foot, with associated skin thickening particularly anteriorly, medially, and laterally. Subcutaneous edema tracks along the superficial fascia margin but no abscess is observed, and no definite muscular involvement is identified. No CT findings of osteomyelitis.  CT foot: No bony destructive findings characteristic of osteomyelitis. Subcutaneous edema tracks overlying the malleoli and within the dorsum of the foot. No drainable abscess observed. No definite muscular involvement. No malalignment at the Lisfranc joint.  Plantar and Achilles calcaneal spurs noted.  IMPRESSION: 1. Subcutaneous edema tracking in the calf and foot as detailed above, without discrete abscess, definite muscular involvement, or bony destructive findings characteristic of osteomyelitis.   Electronically Signed   By: Herbie BaltimoreWalt  Liebkemann M.D.   On: 02/01/2014 12:24    Medications:  Scheduled: . cefTRIAXone (ROCEPHIN)  IV  1 g Intravenous Q24H  . enoxaparin (LOVENOX) injection  40 mg Subcutaneous Q24H  . gabapentin  600 mg Oral TID PC & HS  . metronidazole  500 mg Intravenous Q8H    . nicotine  21 mg Transdermal Daily  . sertraline  150 mg Oral Daily  . sodium chloride  3 mL Intravenous Q12H  . traZODone  100 mg Oral QHS,MR X 1  . vancomycin  1,250 mg Intravenous 3 times per day   Continuous:  ZHY:QMVHQIPRN:sodium chloride, acetaminophen, acetaminophen, HYDROmorphone (DILAUDID) injection, ibuprofen, lip balm, LORazepam, oxyCODONE-acetaminophen, sodium chloride  Assessment/Plan:  Principal Problem:   Cellulitis of leg, left Active Problems:   Polysubstance (including opioids) dependence with physiological dependence   Anxiety state, unspecified   Mood disorder in conditions classified elsewhere   Sepsis    Cellulitis of Left Lower Extremity Very slow to improve. Since swelling and erythema persists, we obtained CT to look for other processes such as abscess. This was negative for abscess. Patient continues to have pain but controlled with narcotics. Continue Vanc, ceftriaxone and Flagyl. His WBC is better. Has good pulses. Bood culture negative so far. Remains afebrile. Emphasized importance of good hygiene, especially in between toes. Keep left leg elevated. Adjust pain medications. Consult wound care. Venous doppler.  Mood disorder in conditions classified elsewhere:  Continue sertraline and Gabapentin.  Polysubstance dependence with physiological dependence  This was about 2 years ago and was predominantly alcohol related per patient. Due to severity of pain IV dilaudid was added.    Code Status: Full code  DVT Prophylaxis: Lovenox    Family Communication: Discussed in detail with patient.  Disposition Plan: Not ready for discharge.    LOS: 6 days   Osvaldo ShipperGokul Keiden Deskin  [facility] Pager [phone] 02/02/2014, 7:38 AM  If 8PM-8AM, please contact night-coverage at [email or web address], password [facility]

## 2014-02-02 NOTE — Progress Notes (Signed)
Report called to dennis little RN, patient to be transferred to [id number]  D [employer] Data SystemsFranklin RN

## 2014-02-03 ENCOUNTER — Encounter (HOSPITAL_COMMUNITY): Payer: Self-pay | Admitting: Radiology

## 2014-02-03 ENCOUNTER — Inpatient Hospital Stay (HOSPITAL_COMMUNITY): Payer: Medicare Other

## 2014-02-03 DIAGNOSIS — B1920 Unspecified viral hepatitis C without hepatic coma: Secondary | ICD-10-CM | POA: Diagnosis present

## 2014-02-03 DIAGNOSIS — K089 Disorder of teeth and supporting structures, unspecified: Secondary | ICD-10-CM | POA: Diagnosis present

## 2014-02-03 LAB — BASIC METABOLIC PANEL
BUN: 10 mg/dL (ref 6–23)
CALCIUM: 9.1 mg/dL (ref 8.4–10.5)
CO2: 28 meq/L (ref 19–32)
Chloride: 103 mEq/L (ref 96–112)
Creatinine, Ser: 0.68 mg/dL (ref 0.50–1.35)
GFR calc Af Amer: >90 mL/min (ref >90)
Glucose, Bld: 98 mg/dL (ref 70–99)
Potassium: 4.6 mEq/L (ref 3.7–5.3)
SODIUM: 141 meq/L (ref 137–147)

## 2014-02-03 LAB — CBC
HCT: 37.9 % — ABNORMAL LOW (ref 39.0–52.0)
HEMOGLOBIN: 13 g/dL (ref 13.0–17.0)
MCH: 31.7 pg (ref 26.0–34.0)
MCHC: 34.3 g/dL (ref 30.0–36.0)
MCV: 92.4 fL (ref 78.0–100.0)
Platelets: 264 10*3/uL (ref 150–400)
RBC: 4.1 MIL/uL — ABNORMAL LOW (ref 4.22–5.81)
RDW: 13.9 % (ref 11.5–15.5)
WBC: 17 10*3/uL — ABNORMAL HIGH (ref 4.0–10.5)

## 2014-02-03 LAB — CULTURE, BLOOD (ROUTINE X 2)
CULTURE: NO GROWTH
Culture: NO GROWTH

## 2014-02-03 MED ORDER — IOHEXOL 300 MG/ML  SOLN
100.0000 mL | Freq: Once | INTRAMUSCULAR | Status: AC | PRN
  Administered 2014-02-03: 100 mL via INTRAVENOUS

## 2014-02-03 MED ORDER — SODIUM CHLORIDE 0.9 % IV SOLN
500.0000 mg | Freq: Four times a day (QID) | INTRAVENOUS | Status: DC
  Administered 2014-02-03 (×2): 500 mg via INTRAVENOUS
  Filled 2014-02-03 (×3): qty 500

## 2014-02-03 NOTE — Progress Notes (Signed)
Patient ID: [name] [name], male   DOB: Aug 06, 1976, 38 y.o.   MRN: [id number]         [facility]    Date of Admission:  01/27/2014           Day 7 vancomycin        Day 1 imipenem       Reason for Consult: Left leg cellulitis    Referring Physician: Dr. Jonette Mate   Principal Problem:   Cellulitis of leg, left Active Problems:   Polysubstance (including opioids) dependence with physiological dependence   Anxiety state, unspecified   Mood disorder in conditions classified elsewhere   Sepsis   Poor dentition   Hepatitis C   . enoxaparin (LOVENOX) injection  40 mg Subcutaneous Q24H  . gabapentin  600 mg Oral TID PC & HS  . imipenem-cilastatin  500 mg Intravenous Q6H  . nicotine  21 mg Transdermal Daily  . sertraline  150 mg Oral Daily  . sodium chloride  3 mL Intravenous Q12H  . traZODone  100 mg Oral QHS,MR X 1  . vancomycin  1,250 mg Intravenous 3 times per day    Recommendations: 1. Continue vancomycin 2. Discontinue imipenem   Assessment: Mr. Czerniak has moderately severe cellulitis of his left leg. Severe cellulitis often worsens on antibiotic therapy and then only improves very slowly. There are some signs that he is beginning to improve. Some of the cellulitis is beginning to recede. He is starting to have some desquamation and his leukocytosis is improving. I don't think that he has failed antibiotic therapy it is just that it will take more time for him to improve to the point where he is able to ambulate more comfortably. He has a history of polysubstance abuse which is likely to make his pain more difficult to control. This infection it is almost certainly due to common strep or staph and I feel that vancomycin is suitable for single drug therapy. I will followup tomorrow afternoon.   HPI: [name] [name] is a 38 y.o. male who developed chills on April 8. He felt bad enough that he came to the emergency department. Only after arrival there  he noticed some redness on the dorsum of his left foot. The redness spread rapidly up his left leg to the level of his groin within a few hours. He also had swelling and pain. He has a history of prior cellulitis while he was in prison 2 years ago but cannot remember the location. The episode was not as severe but took about 2-3 weeks to resolve completely. He states that he still having quite a bit of pain and has not been out of bed much.   Review of Systems: Constitutional: positive for chills and fevers, negative for anorexia, sweats and weight loss Eyes: negative Ears, nose, mouth, throat, and face: negative Respiratory: negative Cardiovascular: negative Gastrointestinal: negative Genitourinary:negative  Past Medical History  Diagnosis Date  . Bipolar disorder   . Hepatitis C   . Depression     History  Substance Use Topics  . Smoking status: Current Every Day Smoker    Types: Cigarettes  . Smokeless tobacco: Not on file  . Alcohol Use: Yes     Comment: Patient reports using THC, Xanax, Heroin, and Alcohol     History reviewed. No pertinent family history. Allergies  Allergen Reactions  . Lithium Anaphylaxis  . Penicillins Rash    OBJECTIVE: Blood pressure 117/63, pulse 98, temperature  98.2 F (36.8 C), temperature source Oral, resp. rate 18, height 5\' 9"  (1.753 m), weight 104.6 kg (230 lb 9.6 oz), SpO2 98.00%. General: He is alert, talkative and in no distress. He is eating dinner that was brought in by his friends who are with him now Skin: He has erythema of his left toes, foot and leg extending up to the level of the groin. The erythema has receded from some of the ink borders on his thigh that were placed in the emergency department on admission. He is beginning to have some desquamation over the dorsum of the foot and lower leg. Lungs: Clear Cor: Regular S1 and S2 no murmurs Abdomen: Nontender   Lab Results Lab Results  Component Value Date   WBC 17.0*  02/03/2014   HGB 13.0 02/03/2014   HCT 37.9* 02/03/2014   MCV 92.4 02/03/2014   PLT 264 02/03/2014    Lab Results  Component Value Date   CREATININE 0.68 02/03/2014   BUN 10 02/03/2014   NA 141 02/03/2014   K 4.6 02/03/2014   CL 103 02/03/2014   CO2 28 02/03/2014    Lab Results  Component Value Date   ALT 89* 01/26/2013   AST 56* 01/26/2013   ALKPHOS 97 01/26/2013   BILITOT 0.2* 01/26/2013     Microbiology: Recent Results (from the past 240 hour(s))  CULTURE, BLOOD (ROUTINE X 2)     Status: None   Collection Time    01/28/14 12:00 PM      Result Value Ref Range Status   Specimen Description BLOOD LEFT ARM   Final   Special Requests BOTTLES DRAWN AEROBIC AND ANAEROBIC 5CC   Final   Culture  Setup Time     Final   Value: 01/28/2014 14:07     [facility]   Culture     Final   Value: NO GROWTH 5 DAYS     [facility]   Report Status 02/03/2014 FINAL   Final  CULTURE, BLOOD (ROUTINE X 2)     Status: None   Collection Time    01/28/14 12:10 PM      Result Value Ref Range Status   Specimen Description BLOOD LEFT HAND   Final   Special Requests BOTTLES DRAWN AEROBIC AND ANAEROBIC 5CC   Final   Culture  Setup Time     Final   Value: 01/28/2014 14:07     [facility]   Culture     Final   Value: NO GROWTH 5 DAYS     [facility]   Report Status 02/03/2014 FINAL   Final  MRSA PCR SCREENING     Status: Abnormal   Collection Time    01/29/14  8:16 PM      Result Value Ref Range Status   MRSA by PCR INVALID RESULTS, SPECIMEN SENT FOR CULTURE (*) NEGATIVE Final   Comment: RESULT CALLED TO, READ BACK BY AND VERIFIED WITH:     SWALTON RN AT 0145 ON [id number] BY DLONG                The GeneXpert MRSA Assay (FDA     approved for NASAL specimens     only), is one component of a     comprehensive MRSA colonization     surveillance program. It is not     intended to diagnose MRSA     infection nor to guide or  monitor treatment for     MRSA infections.  MRSA CULTURE     Status: None   Collection Time    01/29/14  8:16 PM      Result Value Ref Range Status   Specimen Description NOSE   Final   Special Requests NONE   Final   Culture     Final   Value: NO STAPHYLOCOCCUS AUREUS ISOLATED     Note: No MRSA Isolated     [facility]   Report Status 02/01/2014 FINAL   Final    Cliffton AstersJohn Jaxsin Bottomley, MD [facility] [phone] pager   [phone] cell 02/03/2014, 6:05 PM

## 2014-02-03 NOTE — Progress Notes (Signed)
[facility] PROGRESS NOTE  [name] PennerGary M [name] UEA:[id number] DOB: 11-16-1975 DOA: 01/27/2014 PCP: No PCP Per Patient  Assessment/Plan: Principal Problem:  Cellulitis of leg, left  Active Problems:  Polysubstance (including opioids) dependence with physiological dependence  Anxiety state, unspecified  Mood disorder in conditions classified elsewhere  Sepsis  38 yo male h/o polysubstance abuse, sober for over one year, presented with a red rash to the left leg is admitted with cellulitis  1. Cellulitis of Left Lower Extremity/sepsis; Very slow to improve on atx Vanc, ceftriaxone and Flagyl.  -US neg for DVT; CT (4/13): neg for abscess; -still extensive cellulitis, need to repeat CT leg r/o abscess; cont atx, obtain CT leg;  -4/15: change ceftriaxone to zosyn for pseudomonas coverage     2. Mood disorder in conditions classified elsewhere: Continue sertraline and Gabapentin.   3. Polysubstance dependence with physiological dependence This was about 2 years ago and was predominantly alcohol related per patient. -with acute pain due to cellulitis, due to severity of pain IV dilaudid was added.    Code Status: full Family Communication: d/w patient, called Sherrlyn HockKitchen,Jack Friend [phone] no answer try later.  (indicate person spoken with, relationship, and if by phone, the number) Disposition Plan: home pend clinical improvement    Consultants:  None   Procedures:  None   Antibiotics: Vanc 4/9-->  Ceftriaxone 4/10--> 4/15 Flagyl 4/11--> Zosyn 4/15>>>>     (indicate start date, and stop date if known)  HPI/Subjective: alert  Objective: Filed Vitals:   02/03/14 0500  BP: 146/95  Pulse: 113  Temp: 98.5 F (36.9 C)  Resp: 18    Intake/Output Summary (Last 24 hours) at 02/03/14 0800 Last data filed at 02/02/14 2342  Gross per 24 hour  Intake 2904.89 ml  Output   2600 ml  Net 304.89 ml   Filed Weights   01/27/14 1933 01/28/14 0230  Weight: 99.791 kg (220  lb) 104.6 kg (230 lb 9.6 oz)    Exam:   General:  alert  Cardiovascular: s1,s2 rrr  Respiratory: CTA BL  Abdomen: soft, nt,nd   Musculoskeletal: L leg erythema, edema, tender   Data Reviewed: Basic Metabolic Panel:  Recent Labs Lab 01/28/14 0022 01/30/14 0523 01/31/14 0620 02/02/14 0410 02/03/14 0505  NA 134*  --  135* 140 141  K 4.6  --  4.4 4.2 4.6  CL 98  --  98 103 103  CO2 24  --  26 29 28   GLUCOSE 83  --  100* 108* 98  BUN 13  --  8 10 10   CREATININE 0.75 0.61 0.65 0.72 0.68  CALCIUM 8.9  --  8.6 8.7 9.1   Liver Function Tests: No results found for this basename: AST, ALT, ALKPHOS, BILITOT, PROT, ALBUMIN,  in the last 168 hours No results found for this basename: LIPASE, AMYLASE,  in the last 168 hours No results found for this basename: AMMONIA,  in the last 168 hours CBC:  Recent Labs Lab 01/27/14 2314 01/29/14 0503 01/31/14 0620 02/01/14 0545 02/02/14 0410 02/03/14 0505  WBC 36.2* 25.4* 24.2* 15.7* 13.5* 17.0*  NEUTROABS 32.9*  --   --   --   --   --   HGB 15.3 14.2 13.4 13.0 13.0 13.0  HCT 43.9 42.2 39.4 38.4* 38.2* 37.9*  MCV 94.8 94.4 94.0 94.8 92.9 92.4  PLT 234 174 227 218 239 264   Cardiac Enzymes: No results found for this basename: CKTOTAL, CKMB, CKMBINDEX, TROPONINI,  in the last 168 hours  BNP (last 3 results) No results found for this basename: PROBNP,  in the last 8760 hours CBG: No results found for this basename: GLUCAP,  in the last 168 hours  Recent Results (from the past 240 hour(s))  CULTURE, BLOOD (ROUTINE X 2)     Status: None   Collection Time    01/28/14 12:00 PM      Result Value Ref Range Status   Specimen Description BLOOD LEFT ARM   Final   Special Requests BOTTLES DRAWN AEROBIC AND ANAEROBIC 5CC   Final   Culture  Setup Time     Final   Value: 01/28/2014 14:07     [facility]formed [facility]   Culture     Final   Value:        BLOOD CULTURE RECEIVED NO GROWTH TO DATE CULTURE WILL BE HELD FOR 5 DAYS  BEFORE ISSUING A FINAL NEGATIVE REPORT     Performed at [facility]   Report Status PENDING   Incomplete  CULTURE, BLOOD (ROUTINE X 2)     Status: None   Collection Time    01/28/14 12:10 PM      Result Value Ref Range Status   Specimen Description BLOOD LEFT HAND   Final   Special Requests BOTTLES DRAWN AEROBIC AND ANAEROBIC 5CC   Final   Culture  Setup Time     Final   Value: 01/28/2014 14:07     Performed at [facility]   Culture     Final   Value:        BLOOD CULTURE RECEIVED NO GROWTH TO DATE CULTURE WILL BE HELD FOR 5 DAYS BEFORE ISSUING A FINAL NEGATIVE REPORT     Performed at [facility]   Report Status PENDING   Incomplete  MRSA PCR SCREENING     Status: Abnormal   Collection Time    01/29/14  8:16 PM      Result Value Ref Range Status   MRSA by PCR INVALID RESULTS, SPECIMEN SENT FOR CULTURE (*) NEGATIVE Final   Comment: RESULT CALLED TO, READ BACK BY AND VERIFIED WITH:     SWALTON RN AT 0145 ON [id number] BY DLONG                The GeneXpert MRSA Assay (FDA     approved for NASAL specimens     only), is one component of a     comprehensive MRSA colonization     surveillance program. It is not     intended to diagnose MRSA     infection nor to guide or     monitor treatment for     MRSA infections.  MRSA CULTURE     Status: None   Collection Time    01/29/14  8:16 PM      Result Value Ref Range Status   Specimen Description NOSE   Final   Special Requests NONE   Final   Culture     Final   Value: NO STAPHYLOCOCCUS AUREUS ISOLATED     Note: No MRSA Isolated     [facility]   Report Status 02/01/2014 FINAL   Final     Studies: Ct Tibia Fibula Left W Contrast  02/01/2014   CLINICAL DATA:  Left leg cellulitis.  Sepsis.  EXAM: CT OF THE LEFT TIBIA AND FIBULA WITH CONTRAST; CT OF THE LEFT FOOT WITH CONTRAST  TECHNIQUE: Multidetector CT imaging was performed following the  standard protocol during bolus administration  of intravenous contrast.  CONTRAST:  OMNIPAQUE IOHEXOL 300 MG/ML  SOLN  COMPARISON:  None.  FINDINGS: CT tibia/fibula: Trace knee joint effusion without significant synovial enhancement. Subcutaneous edema extends from the distal thigh to the foot, with associated skin thickening particularly anteriorly, medially, and laterally. Subcutaneous edema tracks along the superficial fascia margin but no abscess is observed, and no definite muscular involvement is identified. No CT findings of osteomyelitis.  CT foot: No bony destructive findings characteristic of osteomyelitis. Subcutaneous edema tracks overlying the malleoli and within the dorsum of the foot. No drainable abscess observed. No definite muscular involvement. No malalignment at the Lisfranc joint.  Plantar and Achilles calcaneal spurs noted.  IMPRESSION: 1. Subcutaneous edema tracking in the calf and foot as detailed above, without discrete abscess, definite muscular involvement, or bony destructive findings characteristic of osteomyelitis.   Electronically Signed   By: Herbie Baltimore M.D.   On: 02/01/2014 12:24   Ct Foot Left W Contrast  02/01/2014   CLINICAL DATA:  Left leg cellulitis.  Sepsis.  EXAM: CT OF THE LEFT TIBIA AND FIBULA WITH CONTRAST; CT OF THE LEFT FOOT WITH CONTRAST  TECHNIQUE: Multidetector CT imaging was performed following the standard protocol during bolus administration of intravenous contrast.  CONTRAST:  OMNIPAQUE IOHEXOL 300 MG/ML  SOLN  COMPARISON:  None.  FINDINGS: CT tibia/fibula: Trace knee joint effusion without significant synovial enhancement. Subcutaneous edema extends from the distal thigh to the foot, with associated skin thickening particularly anteriorly, medially, and laterally. Subcutaneous edema tracks along the superficial fascia margin but no abscess is observed, and no definite muscular involvement is identified. No CT findings of osteomyelitis.  CT foot: No bony destructive findings characteristic  of osteomyelitis. Subcutaneous edema tracks overlying the malleoli and within the dorsum of the foot. No drainable abscess observed. No definite muscular involvement. No malalignment at the Lisfranc joint.  Plantar and Achilles calcaneal spurs noted.  IMPRESSION: 1. Subcutaneous edema tracking in the calf and foot as detailed above, without discrete abscess, definite muscular involvement, or bony destructive findings characteristic of osteomyelitis.   Electronically Signed   By: Herbie Baltimore M.D.   On: 02/01/2014 12:24    Scheduled Meds: . cefTRIAXone (ROCEPHIN)  IV  1 g Intravenous Q24H  . enoxaparin (LOVENOX) injection  40 mg Subcutaneous Q24H  . gabapentin  600 mg Oral TID PC & HS  . metronidazole  500 mg Intravenous Q8H  . nicotine  21 mg Transdermal Daily  . sertraline  150 mg Oral Daily  . sodium chloride  3 mL Intravenous Q12H  . traZODone  100 mg Oral QHS,MR X 1  . vancomycin  1,250 mg Intravenous 3 times per day   Continuous Infusions:   Principal Problem:   Cellulitis of leg, left Active Problems:   Polysubstance (including opioids) dependence with physiological dependence   Anxiety state, unspecified   Mood disorder in conditions classified elsewhere   Sepsis    Time spent: >35 minutes     Esperanza Sheets  [facility] Pager [phone]. If 7PM-7AM, please contact night-coverage at [email or web address], password [facility] 02/03/2014, 8:00 AM  LOS: 7 days

## 2014-02-03 NOTE — Progress Notes (Signed)
ANTIBIOTIC CONSULT NOTE - Follow up  Pharmacy Consult for Vancomycin and Primaxin Indication: Severe cellulitis, r/o sepsis.  Allergies  Allergen Reactions  . Lithium Anaphylaxis  . Penicillins Rash   Patient Measurements: Height: 5\' 9"  (175.3 cm) Weight: 230 lb 9.6 oz (104.6 kg) IBW/kg (Calculated) : 70.7  Vital Signs: Temp: 98.5 F (36.9 C) (04/15 0500) Temp src: Oral (04/15 0500) BP: 146/95 mmHg (04/15 0500) Pulse Rate: 113 (04/15 0500) Intake/Output from previous day: 04/14 0701 - 04/15 0700 In: 2904.9 [P.O.:980; I.V.:1374.9; IV Piggyback:550] Out: 2600 [Urine:2600] Intake/Output from this shift:    Labs:  Recent Labs  02/01/14 0545 02/02/14 0410 02/03/14 0505  WBC 15.7* 13.5* 17.0*  HGB 13.0 13.0 13.0  PLT 218 239 264  CREATININE  --  0.72 0.68   Estimated Creatinine Clearance: 153.7 ml/min (by C-G formula based on Cr of 0.68).  Recent Labs  02/01/14 0545  VANCOTROUGH 9.9*    Microbiology: Recent Results (from the past 720 hour(s))  CULTURE, BLOOD (ROUTINE X 2)     Status: None   Collection Time    01/28/14 12:00 PM      Result Value Ref Range Status   Specimen Description BLOOD LEFT ARM   Final   Special Requests BOTTLES DRAWN AEROBIC AND ANAEROBIC 5CC   Final   Culture  Setup Time     Final   Value: 01/28/2014 14:07     Performed at [facility]   Culture     Final   Value:        BLOOD CULTURE RECEIVED NO GROWTH TO DATE CULTURE WILL BE HELD FOR 5 DAYS BEFORE ISSUING A FINAL NEGATIVE REPORT     Performed at [facility]   Report Status PENDING   Incomplete  CULTURE, BLOOD (ROUTINE X 2)     Status: None   Collection Time    01/28/14 12:10 PM      Result Value Ref Range Status   Specimen Description BLOOD LEFT HAND   Final   Special Requests BOTTLES DRAWN AEROBIC AND ANAEROBIC 5CC   Final   Culture  Setup Time     Final   Value: 01/28/2014 14:07     Performed at [facility]   Culture     Final   Value:        BLOOD  CULTURE RECEIVED NO GROWTH TO DATE CULTURE WILL BE HELD FOR 5 DAYS BEFORE ISSUING A FINAL NEGATIVE REPORT     Performed at [facility]   Report Status PENDING   Incomplete  MRSA PCR SCREENING     Status: Abnormal   Collection Time    01/29/14  8:16 PM      Result Value Ref Range Status   MRSA by PCR INVALID RESULTS, SPECIMEN SENT FOR CULTURE (*) NEGATIVE Final   Comment: RESULT CALLED TO, READ BACK BY AND VERIFIED WITH:     SWALTON RN AT 0145 ON [id number] BY DLONG                The GeneXpert MRSA Assay (FDA     approved for NASAL specimens     only), is one component of a     comprehensive MRSA colonization     surveillance program. It is not     intended to diagnose MRSA     infection nor to guide or     monitor treatment for     MRSA infections.  MRSA CULTURE  Status: None   Collection Time    01/29/14  8:16 PM      Result Value Ref Range Status   Specimen Description NOSE   Final   Special Requests NONE   Final   Culture     Final   Value: NO STAPHYLOCOCCUS AUREUS ISOLATED     Note: No MRSA Isolated     [facility]   Report Status 02/01/2014 FINAL   Final   Medications:  Anti-infectives   Start     Dose/Rate Route Frequency Ordered Stop   01/30/14 1530  vancomycin (VANCOCIN) 1,250 mg in sodium chloride 0.9 % 250 mL IVPB     1,250 mg 166.7 mL/hr over 90 Minutes Intravenous 3 times per day 01/30/14 1441     01/30/14 1200  metroNIDAZOLE (FLAGYL) IVPB 500 mg     500 mg 100 mL/hr over 60 Minutes Intravenous Every 8 hours 01/30/14 1042     01/29/14 1200  cefTRIAXone (ROCEPHIN) 1 g in dextrose 5 % 50 mL IVPB  Status:  Discontinued     1 g 100 mL/hr over 30 Minutes Intravenous Every 24 hours 01/29/14 1019 02/03/14 0809   01/28/14 0600  vancomycin (VANCOCIN) IVPB 1000 mg/200 mL premix  Status:  Discontinued     1,000 mg 200 mL/hr over 60 Minutes Intravenous Every 8 hours 01/28/14 0159 01/30/14 1440   01/27/14 2315  vancomycin (VANCOCIN) IVPB  1000 mg/200 mL premix     1,000 mg 200 mL/hr over 60 Minutes Intravenous  Once 01/27/14 2301 01/28/14 0018     Assessment: 37 yoM with LLE cellulitis, hx of polysubstance abuse, clean for > 7953yr. Vancomycin per pharmacy begun 4/9, ceftriaxone added 4/10, Flagyl added 4/11.  On 4/11 vancomycin was increased from 1g q8h to 1250mg  q8h based on trough level.   D7 vancomycin, now 1250mg  IV q8h.  D5 Flagyl 500mg  IV q8h.  D6 Rocephin 1g IV q24h.  WBC rising now.   SCr stable.  Blood culture remains negative.  CT 4/13 negative for abscesses or osteomyelitis. MD considering repeat CT 4/15.   Due to worsening cellulitis on current therapy, stopping Rocephin and Flagyl and starting Primaxin.  Goal of Therapy:  Vancomycin trough level 10-15 mcg/ml  Plan:   Cont vancomycin at 1250mg  IV q8h.  Start Primaxin 500mg  IV q6h.  Follow serum creatinine, clinical course.  F/U on findings from CT.  Charolotte Ekeom Keyontay Stolz, PharmD, pager [phone]. 02/03/2014,8:29 AM.

## 2014-02-04 LAB — CBC
HCT: 37.2 % — ABNORMAL LOW (ref 39.0–52.0)
Hemoglobin: 12.7 g/dL — ABNORMAL LOW (ref 13.0–17.0)
MCH: 31.8 pg (ref 26.0–34.0)
MCHC: 34.1 g/dL (ref 30.0–36.0)
MCV: 93 fL (ref 78.0–100.0)
PLATELETS: 273 10*3/uL (ref 150–400)
RBC: 4 MIL/uL — AB (ref 4.22–5.81)
RDW: 14.1 % (ref 11.5–15.5)
WBC: 17.2 10*3/uL — ABNORMAL HIGH (ref 4.0–10.5)

## 2014-02-04 MED ORDER — POLYETHYLENE GLYCOL 3350 17 G PO PACK
17.0000 g | PACK | Freq: Every day | ORAL | Status: DC | PRN
  Filled 2014-02-04: qty 1

## 2014-02-04 MED ORDER — SENNOSIDES-DOCUSATE SODIUM 8.6-50 MG PO TABS
1.0000 | ORAL_TABLET | Freq: Two times a day (BID) | ORAL | Status: DC
  Administered 2014-02-04 – 2014-02-06 (×4): 1 via ORAL
  Filled 2014-02-04 (×5): qty 1

## 2014-02-04 MED ORDER — GABAPENTIN 400 MG PO CAPS
800.0000 mg | ORAL_CAPSULE | Freq: Three times a day (TID) | ORAL | Status: DC
  Administered 2014-02-04 – 2014-02-06 (×9): 800 mg via ORAL
  Filled 2014-02-04 (×13): qty 2

## 2014-02-04 NOTE — Progress Notes (Signed)
Patient ID: [name] [name], male   DOB: 1976-06-22, 38 y.o.   MRN: [id number]         [facility]    Date of Admission:  01/27/2014           Day 8 vancomycin Principal Problem:   Cellulitis of leg, left Active Problems:   Polysubstance (including opioids) dependence with physiological dependence   Anxiety state, unspecified   Mood disorder in conditions classified elsewhere   Sepsis   Poor dentition   Hepatitis C   . enoxaparin (LOVENOX) injection  40 mg Subcutaneous Q24H  . gabapentin  800 mg Oral TID PC & HS  . nicotine  21 mg Transdermal Daily  . senna-docusate  1 tablet Oral BID  . sertraline  150 mg Oral Daily  . sodium chloride  3 mL Intravenous Q12H  . traZODone  100 mg Oral QHS,MR X 1  . vancomycin  1,250 mg Intravenous 3 times per day     Subjective: He is feeling better today. He's been able to tolerate being up in his room he sat up and worked on his lap top computer. He is still requiring IV pain medications. Review of Systems: Pertinent items are noted in HPI.  Past Medical History  Diagnosis Date  . Bipolar disorder   . Hepatitis C   . Depression     History  Substance Use Topics  . Smoking status: Current Every Day Smoker    Types: Cigarettes  . Smokeless tobacco: Not on file  . Alcohol Use: Yes     Comment: Patient reports using THC, Xanax, Heroin, and Alcohol     History reviewed. No pertinent family history.  Allergies  Allergen Reactions  . Lithium Anaphylaxis  . Penicillins Rash    Objective: Temp:  [97 F (36.1 C)-98.8 F (37.1 C)] 98.4 F (36.9 C) (04/16 1400) Pulse Rate:  [67-91] 87 (04/16 1400) Resp:  [12-20] 14 (04/16 1400) BP: (119-132)/(65-80) 121/66 mmHg (04/16 1400) SpO2:  [94 %-99 %] 94 % (04/16 1400)  General: He is talkative and in no apparent distress Skin: He still has erythema of around his calf area and posterior left thigh but there is no extension in the past 24 hours  Lab  Results Lab Results  Component Value Date   WBC 17.2* 02/04/2014   HGB 12.7* 02/04/2014   HCT 37.2* 02/04/2014   MCV 93.0 02/04/2014   PLT 273 02/04/2014    Lab Results  Component Value Date   CREATININE 0.68 02/03/2014   BUN 10 02/03/2014   NA 141 02/03/2014   K 4.6 02/03/2014   CL 103 02/03/2014   CO2 28 02/03/2014    Lab Results  Component Value Date   ALT 89* 01/26/2013   AST 56* 01/26/2013   ALKPHOS 97 01/26/2013   BILITOT 0.2* 01/26/2013      Microbiology: Recent Results (from the past 240 hour(s))  CULTURE, BLOOD (ROUTINE X 2)     Status: None   Collection Time    01/28/14 12:00 PM      Result Value Ref Range Status   Specimen Description BLOOD LEFT ARM   Final   Special Requests BOTTLES DRAWN AEROBIC AND ANAEROBIC 5CC   Final   Culture  Setup Time     Final   Value: 01/28/2014 14:07     Performed at [facility]   Culture     Final   Value: NO GROWTH 5 DAYS  [facility]formed at [facility]   Report Status 02/03/2014 FINAL   Final  CULTURE, BLOOD (ROUTINE X 2)     Status: None   Collection Time    01/28/14 12:10 PM      Result Value Ref Range Status   Specimen Description BLOOD LEFT HAND   Final   Special Requests BOTTLES DRAWN AEROBIC AND ANAEROBIC 5CC   Final   Culture  Setup Time     Final   Value: 01/28/2014 14:07     Performed at [facility]   Culture     Final   Value: NO GROWTH 5 DAYS     Performed at [facility]   Report Status 02/03/2014 FINAL   Final  MRSA PCR SCREENING     Status: Abnormal   Collection Time    01/29/14  8:16 PM      Result Value Ref Range Status   MRSA by PCR INVALID RESULTS, SPECIMEN SENT FOR CULTURE (*) NEGATIVE Final   Comment: RESULT CALLED TO, READ BACK BY AND VERIFIED WITH:     SWALTON RN AT 0145 ON [id number] BY DLONG                The GeneXpert MRSA Assay (FDA     approved for NASAL specimens     only), is one component of a     comprehensive MRSA colonization     surveillance program. It is not      intended to diagnose MRSA     infection nor to guide or     monitor treatment for     MRSA infections.  MRSA CULTURE     Status: None   Collection Time    01/29/14  8:16 PM      Result Value Ref Range Status   Specimen Description NOSE   Final   Special Requests NONE   Final   Culture     Final   Value: NO STAPHYLOCOCCUS AUREUS ISOLATED     Note: No MRSA Isolated     [facility]   Report Status 02/01/2014 FINAL   Final    Studies/Results: Ct Tibia Fibula Left W Contrast  02/03/2014   CLINICAL DATA:  Left lower leg cellulitis with slow improvement with antibiotic therapy. Evaluate for underlying abscess.  EXAM: CT OF THE LEFT TIBIA AND FIBULA WITH CONTRAST  TECHNIQUE: Multidetector CT imaging of the bilateral lower extremities was performed according to the standard protocol following intravenous contrast administration. Sagittal and coronal plane reformatted images were reconstructed from the axial CT data.  CONTRAST:  100mL OMNIPAQUE IOHEXOL 300 MG/ML  SOLN  COMPARISON:  Prior examinations 02/01/2014.  FINDINGS: Diffuse subcutaneous edema in the left lower leg has mildly worsened compared with the prior study, especially anteriorly in the proximal lower leg. Again, edema tracks along the superficial fascial margin, but there is no focal fluid collection. Associated dermal thickening is stable. There is no abnormal intramuscular enhancement or fluid collection. No vascular abnormalities are identified.  There is no significant joint effusion. There is no evidence of bone destruction or foreign body. Mild spurring of both malleoli is noted at the ankle. Calcaneal spurring is stable.  IMPRESSION: 1. Mild progression of left lower leg cellulitis without focal fluid collection or evidence of myositis. 2. No evidence of osteomyelitis.   Electronically Signed   By: Roxy HorsemanBill  Veazey M.D.   On: 02/03/2014 12:11    Assessment: He is improving slowly. A limiting factor  in his  discharge his pain control. He can switch to oral antibiotics soon.  Plan: 1. Continue vancomycin for now  Cliffton AstersJohn Eriana Suliman, MD [facility] [phone] pager   [phone] cell 02/04/2014, 5:09 PM

## 2014-02-04 NOTE — Progress Notes (Signed)
Agree with the previous RN's assessment. Will continue to monitor patient. Devetta Hagenow M Setzer  

## 2014-02-04 NOTE — Progress Notes (Signed)
[facility] PROGRESS NOTE  [name] PennerGary M [name] ZOX:[id number] DOB: 1976-04-23 DOA: 01/27/2014 PCP: No PCP Per Patient  Assessment/Plan: Principal Problem:  Cellulitis of leg, left  Active Problems:  Polysubstance (including opioids) dependence with physiological dependence  Anxiety state, unspecified  Mood disorder in conditions classified elsewhere  Sepsis  38 yo male h/o polysubstance abuse, sober for over one year, presented with a red rash to the left leg is admitted with severe cellulitis  1. Cellulitis of Left Lower Extremity/sepsis; Very slow to improve on atx.  -US neg for DVT; CT (4/13): neg for abscess; repeat CT (4/15): progression of cellulitis but no abscess  -still extensive cellulitis, need to repeat CT leg r/o abscess; cont atx, obtain CT leg;  -appreciate ID input; 4/15: atx changed to vanc only; cont monioring    2. Mood disorder in conditions classified elsewhere: Continue sertraline and Gabapentin.   3. Polysubstance dependence with physiological dependence This was about 2 years ago and was predominantly alcohol related per patient. -with acute pain due to cellulitis, due to severity of pain IV dilaudid was added.    Code Status: full Family Communication: d/w patient, he declined to contact his family;  (indicate person spoken with, relationship, and if by phone, the number) Disposition Plan: home pend clinical improvement    Consultants:  None   Procedures:  None   Antibiotics: Vanc 4/9-->  Ceftriaxone 4/10--> 4/15 Flagyl 4/11--> Zosyn 4/15>>>>     (indicate start date, and stop date if known)  HPI/Subjective: alert  Objective: Filed Vitals:   02/04/14 0610  BP: 119/65  Pulse: 67  Temp: 98.1 F (36.7 C)  Resp: 18    Intake/Output Summary (Last 24 hours) at 02/04/14 0851 Last data filed at 02/04/14 0844  Gross per 24 hour  Intake      0 ml  Output   6750 ml  Net  -6750 ml   Filed Weights   01/27/14 1933 01/28/14 0230  Weight:  99.791 kg (220 lb) 104.6 kg (230 lb 9.6 oz)    Exam:   General:  alert  Cardiovascular: s1,s2 rrr  Respiratory: CTA BL  Abdomen: soft, nt,nd   Musculoskeletal: L leg erythema, edema, tender   Data Reviewed: Basic Metabolic Panel:  Recent Labs Lab 01/30/14 0523 01/31/14 0620 02/02/14 0410 02/03/14 0505  NA  --  135* 140 141  K  --  4.4 4.2 4.6  CL  --  98 103 103  CO2  --  26 29 28   GLUCOSE  --  100* 108* 98  BUN  --  8 10 10   CREATININE 0.61 0.65 0.72 0.68  CALCIUM  --  8.6 8.7 9.1   Liver Function Tests: No results found for this basename: AST, ALT, ALKPHOS, BILITOT, PROT, ALBUMIN,  in the last 168 hours No results found for this basename: LIPASE, AMYLASE,  in the last 168 hours No results found for this basename: AMMONIA,  in the last 168 hours CBC:  Recent Labs Lab 01/31/14 0620 02/01/14 0545 02/02/14 0410 02/03/14 0505 02/04/14 0545  WBC 24.2* 15.7* 13.5* 17.0* 17.2*  HGB 13.4 13.0 13.0 13.0 12.7*  HCT 39.4 38.4* 38.2* 37.9* 37.2*  MCV 94.0 94.8 92.9 92.4 93.0  PLT 227 218 239 264 273   Cardiac Enzymes: No results found for this basename: CKTOTAL, CKMB, CKMBINDEX, TROPONINI,  in the last 168 hours BNP (last 3 results) No results found for this basename: PROBNP,  in the last 8760 hours CBG: No results found for  this basename: GLUCAP,  in the last 168 hours  Recent Results (from the past 240 hour(s))  CULTURE, BLOOD (ROUTINE X 2)     Status: None   Collection Time    01/28/14 12:00 PM      Result Value Ref Range Status   Specimen Description BLOOD LEFT ARM   Final   Special Requests BOTTLES DRAWN AEROBIC AND ANAEROBIC 5CC   Final   Culture  Setup Time     Final   Value: 01/28/2014 14:07     Performed at [facility]   Culture     Final   Value: NO GROWTH 5 DAYS     Performed at [facility]   Report Status 02/03/2014 FINAL   Final  CULTURE, BLOOD (ROUTINE X 2)     Status: None   Collection Time    01/28/14 12:10 PM       Result Value Ref Range Status   Specimen Description BLOOD LEFT HAND   Final   Special Requests BOTTLES DRAWN AEROBIC AND ANAEROBIC 5CC   Final   Culture  Setup Time     Final   Value: 01/28/2014 14:07     Performed at [facility]   Culture     Final   Value: NO GROWTH 5 DAYS     Performed at [facility]   Report Status 02/03/2014 FINAL   Final  MRSA PCR SCREENING     Status: Abnormal   Collection Time    01/29/14  8:16 PM      Result Value Ref Range Status   MRSA by PCR INVALID RESULTS, SPECIMEN SENT FOR CULTURE (*) NEGATIVE Final   Comment: RESULT CALLED TO, READ BACK BY AND VERIFIED WITH:     SWALTON RN AT 0145 ON [id number] BY DLONG                The GeneXpert MRSA Assay (FDA     approved for NASAL specimens     only), is one component of a     comprehensive MRSA colonization     surveillance program. It is not     intended to diagnose MRSA     infection nor to guide or     monitor treatment for     MRSA infections.  MRSA CULTURE     Status: None   Collection Time    01/29/14  8:16 PM      Result Value Ref Range Status   Specimen Description NOSE   Final   Special Requests NONE   Final   Culture     Final   Value: NO STAPHYLOCOCCUS AUREUS ISOLATED     Note: No MRSA Isolated     Performed at [facility]   Report Status 02/01/2014 FINAL   Final     Studies: Ct Tibia Fibula Left W Contrast  02/03/2014   CLINICAL DATA:  Left lower leg cellulitis with slow improvement with antibiotic therapy. Evaluate for underlying abscess.  EXAM: CT OF THE LEFT TIBIA AND FIBULA WITH CONTRAST  TECHNIQUE: Multidetector CT imaging of the bilateral lower extremities was performed according to the standard protocol following intravenous contrast administration. Sagittal and coronal plane reformatted images were reconstructed from the axial CT data.  CONTRAST:  OMNIPAQUE IOHEXOL 300 MG/ML  SOLN  COMPARISON:  Prior examinations 02/01/2014.  FINDINGS: Diffuse  subcutaneous edema in the left lower leg has mildly worsened compared with the prior study, especially anteriorly in the  proximal lower leg. Again, edema tracks along the superficial fascial margin, but there is no focal fluid collection. Associated dermal thickening is stable. There is no abnormal intramuscular enhancement or fluid collection. No vascular abnormalities are identified.  There is no significant joint effusion. There is no evidence of bone destruction or foreign body. Mild spurring of both malleoli is noted at the ankle. Calcaneal spurring is stable.  IMPRESSION: 1. Mild progression of left lower leg cellulitis without focal fluid collection or evidence of myositis. 2. No evidence of osteomyelitis.   Electronically Signed   By: Roxy HorsemanBill  Veazey M.D.   On: 02/03/2014 12:11    Scheduled Meds: . enoxaparin (LOVENOX) injection  40 mg Subcutaneous Q24H  . gabapentin  600 mg Oral TID PC & HS  . nicotine  21 mg Transdermal Daily  . sertraline  150 mg Oral Daily  . sodium chloride  3 mL Intravenous Q12H  . traZODone  100 mg Oral QHS,MR X 1  . vancomycin  1,250 mg Intravenous 3 times per day   Continuous Infusions:   Principal Problem:   Cellulitis of leg, left Active Problems:   Polysubstance (including opioids) dependence with physiological dependence   Anxiety state, unspecified   Mood disorder in conditions classified elsewhere   Sepsis   Poor dentition   Hepatitis C    Time spent: >35 minutes     Esperanza SheetsUlugbek N Pearson Reasons  [facility] Pager [phone]. If 7PM-7AM, please contact night-coverage at [email or web address], password [email or web address] 02/04/2014, 8:51 AM  LOS: 8 days

## 2014-02-05 LAB — VANCOMYCIN, TROUGH: VANCOMYCIN TR: 11.8 ug/mL (ref 10.0–20.0)

## 2014-02-05 MED ORDER — DOXYCYCLINE HYCLATE 100 MG PO TABS
100.0000 mg | ORAL_TABLET | Freq: Two times a day (BID) | ORAL | Status: DC
  Administered 2014-02-05 – 2014-02-06 (×2): 100 mg via ORAL
  Filled 2014-02-05 (×3): qty 1

## 2014-02-05 MED ORDER — HYDROCERIN EX CREA
TOPICAL_CREAM | Freq: Two times a day (BID) | CUTANEOUS | Status: DC
  Administered 2014-02-05 – 2014-02-06 (×2): via TOPICAL
  Filled 2014-02-05: qty 113

## 2014-02-05 MED ORDER — CEPHALEXIN 500 MG PO CAPS
500.0000 mg | ORAL_CAPSULE | Freq: Four times a day (QID) | ORAL | Status: DC
  Administered 2014-02-05 – 2014-02-06 (×3): 500 mg via ORAL
  Filled 2014-02-05 (×7): qty 1

## 2014-02-05 NOTE — Progress Notes (Signed)
[facility] PROGRESS NOTE  [name] PennerGary M [name] ZOX:[id number] DOB: 04-12-1976 DOA: 01/27/2014 PCP: No PCP Per Patient  Assessment/Plan: Principal Problem:  Cellulitis of leg, left  Active Problems:  Polysubstance (including opioids) dependence with physiological dependence  Anxiety state, unspecified  Mood disorder in conditions classified elsewhere  Sepsis  38 yo male h/o polysubstance abuse, sober for over one year, presented with a red rash to the left leg is admitted with severe cellulitis  1. Cellulitis of Left Lower Extremity/sepsis; Very slow to improve on atx.  -US neg for DVT; CT (4/13): neg for abscess; repeat CT (4/15): progression of cellulitis but no abscess  -mild improved;  extensive cellulitis -appreciate ID input; 4/15: atx changed to vanc only; cont monioring    2. Mood disorder in conditions classified elsewhere: Continue sertraline and Gabapentin.   3. Polysubstance dependence with physiological dependence This was about 2 years ago and was predominantly alcohol related per patient. -with acute pain due to cellulitis, due to severity of pain IV dilaudid was added. Transition to PO upon d/c     Code Status: full Family Communication: d/w patient, he declined to contact his family;  (indicate person spoken with, relationship, and if by phone, the number) Disposition Plan: home pend clinical improvement, likely on 4/18 if okay with ID   Consultants:  None   Procedures:  None   Antibiotics: Vanc 4/9-->  Ceftriaxone 4/10--> 4/15 Flagyl 4/11--> Zosyn 4/15>>>>     (indicate start date, and stop date if known)  HPI/Subjective: alert  Objective: Filed Vitals:   02/05/14 0552  BP: 133/85  Pulse: 84  Temp: 98.6 F (37 C)  Resp: 20    Intake/Output Summary (Last 24 hours) at 02/05/14 1034 Last data filed at 02/05/14 0550  Gross per 24 hour  Intake 1904.33 ml  Output   1725 ml  Net 179.33 ml   Filed Weights   01/27/14 1933 01/28/14 0230   Weight: 99.791 kg (220 lb) 104.6 kg (230 lb 9.6 oz)    Exam:   General:  alert  Cardiovascular: s1,s2 rrr  Respiratory: CTA BL  Abdomen: soft, nt,nd   Musculoskeletal: L leg erythema, edema, tender   Data Reviewed: Basic Metabolic Panel:  Recent Labs Lab 01/30/14 0523 01/31/14 0620 02/02/14 0410 02/03/14 0505  NA  --  135* 140 141  K  --  4.4 4.2 4.6  CL  --  98 103 103  CO2  --  26 29 28   GLUCOSE  --  100* 108* 98  BUN  --  8 10 10   CREATININE 0.61 0.65 0.72 0.68  CALCIUM  --  8.6 8.7 9.1   Liver Function Tests: No results found for this basename: AST, ALT, ALKPHOS, BILITOT, PROT, ALBUMIN,  in the last 168 hours No results found for this basename: LIPASE, AMYLASE,  in the last 168 hours No results found for this basename: AMMONIA,  in the last 168 hours CBC:  Recent Labs Lab 01/31/14 0620 02/01/14 0545 02/02/14 0410 02/03/14 0505 02/04/14 0545  WBC 24.2* 15.7* 13.5* 17.0* 17.2*  HGB 13.4 13.0 13.0 13.0 12.7*  HCT 39.4 38.4* 38.2* 37.9* 37.2*  MCV 94.0 94.8 92.9 92.4 93.0  PLT 227 218 239 264 273   Cardiac Enzymes: No results found for this basename: CKTOTAL, CKMB, CKMBINDEX, TROPONINI,  in the last 168 hours BNP (last 3 results) No results found for this basename: PROBNP,  in the last 8760 hours CBG: No results found for this basename: GLUCAP,  in  the last 168 hours  Recent Results (from the past 240 hour(s))  CULTURE, BLOOD (ROUTINE X 2)     Status: None   Collection Time    01/28/14 12:00 PM      Result Value Ref Range Status   Specimen Description BLOOD LEFT ARM   Final   Special Requests BOTTLES DRAWN AEROBIC AND ANAEROBIC 5CC   Final   Culture  Setup Time     Final   Value: 01/28/2014 14:07     [facility]   Culture     Final   Value: NO GROWTH 5 DAYS     [facility]   Report Status 02/03/2014 FINAL   Final  CULTURE, BLOOD (ROUTINE X 2)     Status: None   Collection Time    01/28/14 12:10  PM      Result Value Ref Range Status   Specimen Description BLOOD LEFT HAND   Final   Special Requests BOTTLES DRAWN AEROBIC AND ANAEROBIC 5CC   Final   Culture  Setup Time     Final   Value: 01/28/2014 14:07     [facility]   Culture     Final   Value: NO GROWTH 5 DAYS     [facility]   Report Status 02/03/2014 FINAL   Final  MRSA PCR SCREENING     Status: Abnormal   Collection Time    01/29/14  8:16 PM      Result Value Ref Range Status   MRSA by PCR INVALID RESULTS, SPECIMEN SENT FOR CULTURE (*) NEGATIVE Final   Comment: RESULT CALLED TO, READ BACK BY AND VERIFIED WITH:     SWALTON RN AT 0145 ON [id number] BY DLONG                The GeneXpert MRSA Assay (FDA     approved for NASAL specimens     only), is one component of a     comprehensive MRSA colonization     surveillance program. It is not     intended to diagnose MRSA     infection nor to guide or     monitor treatment for     MRSA infections.  MRSA CULTURE     Status: None   Collection Time    01/29/14  8:16 PM      Result Value Ref Range Status   Specimen Description NOSE   Final   Special Requests NONE   Final   Culture     Final   Value: NO STAPHYLOCOCCUS AUREUS ISOLATED     Note: No MRSA Isolated     [facility]   Report Status 02/01/2014 FINAL   Final     Studies: Ct Tibia Fibula Left W Contrast  02/03/2014   CLINICAL DATA:  Left lower leg cellulitis with slow improvement with antibiotic therapy. Evaluate for underlying abscess.  EXAM: CT OF THE LEFT TIBIA AND FIBULA WITH CONTRAST  TECHNIQUE: Multidetector CT imaging of the bilateral lower extremities was performed according to the standard protocol following intravenous contrast administration. Sagittal and coronal plane reformatted images were reconstructed from the axial CT data.  CONTRAST:  100mL OMNIPAQUE IOHEXOL 300 MG/ML  SOLN  COMPARISON:  Prior examinations 02/01/2014.  FINDINGS:  Diffuse subcutaneous edema in the left lower leg has mildly worsened compared with the prior study, especially anteriorly in the proximal lower leg. Again, edema  tracks along the superficial fascial margin, but there is no focal fluid collection. Associated dermal thickening is stable. There is no abnormal intramuscular enhancement or fluid collection. No vascular abnormalities are identified.  There is no significant joint effusion. There is no evidence of bone destruction or foreign body. Mild spurring of both malleoli is noted at the ankle. Calcaneal spurring is stable.  IMPRESSION: 1. Mild progression of left lower leg cellulitis without focal fluid collection or evidence of myositis. 2. No evidence of osteomyelitis.   Electronically Signed   By: Roxy Horseman M.D.   On: 02/03/2014 12:11    Scheduled Meds: . enoxaparin (LOVENOX) injection  40 mg Subcutaneous Q24H  . gabapentin  800 mg Oral TID PC & HS  . nicotine  21 mg Transdermal Daily  . senna-docusate  1 tablet Oral BID  . sertraline  150 mg Oral Daily  . sodium chloride  3 mL Intravenous Q12H  . traZODone  100 mg Oral QHS,MR X 1  . vancomycin  1,250 mg Intravenous 3 times per day   Continuous Infusions:   Principal Problem:   Cellulitis of leg, left Active Problems:   Polysubstance (including opioids) dependence with physiological dependence   Anxiety state, unspecified   Mood disorder in conditions classified elsewhere   Sepsis   Poor dentition   Hepatitis C    Time spent: >35 minutes     Esperanza Sheets  [facility] Pager [phone]. If 7PM-7AM, please contact night-coverage at [email or web address], password [facility] 02/05/2014, 10:34 AM  LOS: 9 days

## 2014-02-05 NOTE — Progress Notes (Signed)
ANTIBIOTIC CONSULT NOTE - FOLLOW UP  Pharmacy Consult for Vancomycin Indication: Cellulitis  Allergies  Allergen Reactions  . Lithium Anaphylaxis  . Penicillins Rash    Patient Measurements: Height: 5\' 9"  (175.3 cm) Weight: 230 lb 9.6 oz (104.6 kg) IBW/kg (Calculated) : 70.7 Adjusted Body Weight:   Vital Signs: Temp: 98.6 F (37 C) (04/17 0552) Temp src: Oral (04/17 0552) BP: 133/85 mmHg (04/17 0552) Pulse Rate: 84 (04/17 0552) Intake/Output from previous day: 04/16 0701 - 04/17 0700 In: 2257.3 [P.O.:350; I.V.:157.3; IV Piggyback:1750] Out: 2075 [Urine:2075] Intake/Output from this shift: Total I/O In: -  Out: 700 [Urine:700]  Labs:  Recent Labs  02/03/14 0505 02/04/14 0545  WBC 17.0* 17.2*  HGB 13.0 12.7*  PLT 264 273  CREATININE 0.68  --    Estimated Creatinine Clearance: 153.7 ml/min (by C-G formula based on Cr of 0.68).  Recent Labs  02/05/14 0538  VANCOTROUGH 11.8     Microbiology: Recent Results (from the past 720 hour(s))  CULTURE, BLOOD (ROUTINE X 2)     Status: None   Collection Time    01/28/14 12:00 PM      Result Value Ref Range Status   Specimen Description BLOOD LEFT ARM   Final   Special Requests BOTTLES DRAWN AEROBIC AND ANAEROBIC 5CC   Final   Culture  Setup Time     Final   Value: 01/28/2014 14:07     [facility]   Culture     Final   Value: NO GROWTH 5 DAYS     [facility]   Report Status 02/03/2014 FINAL   Final  CULTURE, BLOOD (ROUTINE X 2)     Status: None   Collection Time    01/28/14 12:10 PM      Result Value Ref Range Status   Specimen Description BLOOD LEFT HAND   Final   Special Requests BOTTLES DRAWN AEROBIC AND ANAEROBIC 5CC   Final   Culture  Setup Time     Final   Value: 01/28/2014 14:07     [facility]   Culture     Final   Value: NO GROWTH 5 DAYS     [facility]   Report Status 02/03/2014 FINAL   Final  MRSA PCR  SCREENING     Status: Abnormal   Collection Time    01/29/14  8:16 PM      Result Value Ref Range Status   MRSA by PCR INVALID RESULTS, SPECIMEN SENT FOR CULTURE (*) NEGATIVE Final   Comment: RESULT CALLED TO, READ BACK BY AND VERIFIED WITH:     SWALTON RN AT 0145 ON [id number] BY DLONG                The GeneXpert MRSA Assay (FDA     approved for NASAL specimens     only), is one component of a     comprehensive MRSA colonization     surveillance program. It is not     intended to diagnose MRSA     infection nor to guide or     monitor treatment for     MRSA infections.  MRSA CULTURE     Status: None   Collection Time    01/29/14  8:16 PM      Result Value Ref Range Status   Specimen Description NOSE   Final   Special Requests NONE   Final   Culture  Final   Value: NO STAPHYLOCOCCUS AUREUS ISOLATED     Note: No MRSA Isolated     [facility]   Report Status 02/01/2014 FINAL   Final    Anti-infectives   Start     Dose/Rate Route Frequency Ordered Stop   02/03/14 1000  imipenem-cilastatin (PRIMAXIN) 500 mg in sodium chloride 0.9 % 100 mL IVPB  Status:  Discontinued     500 mg 200 mL/hr over 30 Minutes Intravenous Every 6 hours 02/03/14 0831 02/03/14 1815   01/30/14 1530  vancomycin (VANCOCIN) 1,250 mg in sodium chloride 0.9 % 250 mL IVPB     1,250 mg 166.7 mL/hr over 90 Minutes Intravenous 3 times per day 01/30/14 1441     01/30/14 1200  metroNIDAZOLE (FLAGYL) IVPB 500 mg  Status:  Discontinued     500 mg 100 mL/hr over 60 Minutes Intravenous Every 8 hours 01/30/14 1042 02/03/14 0830   01/29/14 1200  cefTRIAXone (ROCEPHIN) 1 g in dextrose 5 % 50 mL IVPB  Status:  Discontinued     1 g 100 mL/hr over 30 Minutes Intravenous Every 24 hours 01/29/14 1019 02/03/14 0809   01/28/14 0600  vancomycin (VANCOCIN) IVPB 1000 mg/200 mL premix  Status:  Discontinued     1,000 mg 200 mL/hr over 60 Minutes Intravenous Every 8 hours 01/28/14 0159 01/30/14 1440    01/27/14 2315  vancomycin (VANCOCIN) IVPB 1000 mg/200 mL premix     1,000 mg 200 mL/hr over 60 Minutes Intravenous  Once 01/27/14 2301 01/28/14 0018      Assessment: Patient with vancomycin level at goal.  Prior doses charted correctly.  Goal of Therapy:  Vancomycin trough level 10-15 mcg/ml  Plan:  Measure antibiotic drug levels at steady state Follow up culture results Continue vancomycin as ordered.  [name] Bieri Crowford [employer]. 02/05/2014,6:45 AM

## 2014-02-05 NOTE — Progress Notes (Signed)
Patient ID: [name] PennerGary M Mcneish, male   DOB: 1976/10/17, 38 y.o.   MRN: [id number]         [facility]    Date of Admission:  01/27/2014           Day 9 vancomycin  Principal Problem:   Cellulitis of leg, left Active Problems:   Polysubstance (including opioids) dependence with physiological dependence   Anxiety state, unspecified   Mood disorder in conditions classified elsewhere   Sepsis   Poor dentition   Hepatitis C   . enoxaparin (LOVENOX) injection  40 mg Subcutaneous Q24H  . gabapentin  800 mg Oral TID PC & HS  . nicotine  21 mg Transdermal Daily  . senna-docusate  1 tablet Oral BID  . sertraline  150 mg Oral Daily  . sodium chloride  3 mL Intravenous Q12H  . traZODone  100 mg Oral QHS,MR X 1  . vancomycin  1,250 mg Intravenous 3 times per day    Subjective: He is still having left leg pain but is feeling better and is spending much more time up out of bed.  Objective: Temp:  [98.3 F (36.8 C)-98.6 F (37 C)] 98.6 F (37 C) (04/17 0552) Pulse Rate:  [81-84] 84 (04/17 0552) Resp:  [18-20] 20 (04/17 0552) BP: (131-133)/(71-85) 133/85 mmHg (04/17 0552) SpO2:  [95 %-98 %] 95 % (04/17 0552)  General: He is in no distress. He is working on his Health and safety inspectorlaptop computer Skin: His left leg cellulitis has improved significantly over the past 24 hours. Erythema and swelling of his left posterior thigh has decreased. The desquamation over the dorsum of his foot is progressing as expected   Lab Results Lab Results  Component Value Date   WBC 17.2* 02/04/2014   HGB 12.7* 02/04/2014   HCT 37.2* 02/04/2014   MCV 93.0 02/04/2014   PLT 273 02/04/2014    Lab Results  Component Value Date   CREATININE 0.68 02/03/2014   BUN 10 02/03/2014   NA 141 02/03/2014   K 4.6 02/03/2014   CL 103 02/03/2014   CO2 28 02/03/2014    Lab Results  Component Value Date   ALT 89* 01/26/2013   AST 56* 01/26/2013   ALKPHOS 97 01/26/2013   BILITOT 0.2* 01/26/2013       Microbiology: Recent Results (from the past 240 hour(s))  CULTURE, BLOOD (ROUTINE X 2)     Status: None   Collection Time    01/28/14 12:00 PM      Result Value Ref Range Status   Specimen Description BLOOD LEFT ARM   Final   Special Requests BOTTLES DRAWN AEROBIC AND ANAEROBIC 5CC   Final   Culture  Setup Time     Final   Value: 01/28/2014 14:07     [facility]   Culture     Final   Value: NO GROWTH 5 DAYS     [facility]   Report Status 02/03/2014 FINAL   Final  CULTURE, BLOOD (ROUTINE X 2)     Status: None   Collection Time    01/28/14 12:10 PM      Result Value Ref Range Status   Specimen Description BLOOD LEFT HAND   Final   Special Requests BOTTLES DRAWN AEROBIC AND ANAEROBIC 5CC   Final   Culture  Setup Time     Final   Value: 01/28/2014 14:07     [facility]  Culture     Final   Value: NO GROWTH 5 DAYS     [facility]   Report Status 02/03/2014 FINAL   Final  MRSA PCR SCREENING     Status: Abnormal   Collection Time    01/29/14  8:16 PM      Result Value Ref Range Status   MRSA by PCR INVALID RESULTS, SPECIMEN SENT FOR CULTURE (*) NEGATIVE Final   Comment: RESULT CALLED TO, READ BACK BY AND VERIFIED WITH:     SWALTON RN AT 0145 ON [id number] BY DLONG                The GeneXpert MRSA Assay (FDA     approved for NASAL specimens     only), is one component of a     comprehensive MRSA colonization     surveillance program. It is not     intended to diagnose MRSA     infection nor to guide or     monitor treatment for     MRSA infections.  MRSA CULTURE     Status: None   Collection Time    01/29/14  8:16 PM      Result Value Ref Range Status   Specimen Description NOSE   Final   Special Requests NONE   Final   Culture     Final   Value: NO STAPHYLOCOCCUS AUREUS ISOLATED     Note: No MRSA Isolated     [facility]   Report Status 02/01/2014 FINAL    Final    Assessment: Resolving left leg cellulitis. I will go ahead and switch to empiric oral antibiotic therapy and plan on 5 more days of therapy. I suspect he will be okay for discharge this weekend.  Plan: 1. Change IV vancomycin to oral cephalexin and doxycycline to treat for 5 more days 2. Simple soap and water bathing followed by moisturizing cream 3. I will sign off now please call if I can be of further assistance while he is here  Cliffton AstersJohn Anushri Casalino, MD [facility] [phone] pager   [phone] cell 02/05/2014, 2:07 PM

## 2014-02-06 MED ORDER — HYDROCERIN EX CREA: 1.0000 "application " | TOPICAL_CREAM | Freq: Two times a day (BID) | CUTANEOUS | Status: DC

## 2014-02-06 MED ORDER — OXYCODONE-ACETAMINOPHEN 5-325 MG PO TABS
2.0000 | ORAL_TABLET | ORAL | Status: DC | PRN
Start: 2014-02-06 — End: 2014-04-05

## 2014-02-06 MED ORDER — CEPHALEXIN 500 MG PO CAPS: 500.0000 mg | ORAL_CAPSULE | Freq: Four times a day (QID) | ORAL | Status: DC

## 2014-02-06 MED ORDER — DOXYCYCLINE HYCLATE 100 MG PO TABS: 100.0000 mg | ORAL_TABLET | Freq: Two times a day (BID) | ORAL | Status: DC

## 2014-02-06 MED ORDER — LIP MEDEX EX OINT: TOPICAL_OINTMENT | CUTANEOUS | Status: DC | PRN

## 2014-02-06 NOTE — Discharge Summary (Signed)
Physician Discharge Summary  [name] [name] ZOX:[id number] DOB: 12-23-1975 DOA: 01/27/2014  PCP: No PCP Per Patient  Admit date: 01/27/2014 Discharge date: 02/06/2014  Time spent: >35 minutes  Recommendations for Outpatient Follow-up:  F/u with PCP in 3-5 days  Discharge Diagnoses:  Principal Problem:   Cellulitis of leg, left Active Problems:   Polysubstance (including opioids) dependence with physiological dependence   Anxiety state, unspecified   Mood disorder in conditions classified elsewhere   Sepsis   Poor dentition   Hepatitis C   Discharge Condition: stable   Diet recommendation: regular   Filed Weights   01/27/14 1933 01/28/14 0230  Weight: 99.791 kg (220 lb) 104.6 kg (230 lb 9.6 oz)    History of present illness:  Principal Problem:  Cellulitis of leg, left  Active Problems:  Polysubstance (including opioids) dependence with physiological dependence  Anxiety state, unspecified  Mood disorder in conditions classified elsewhere  Sepsis   Hospital Course:  38 yo male h/o polysubstance abuse, sober for over one year, presented with a red rash to the left leg is admitted with severe cellulitis  1. Cellulitis of Left Lower Extremity/sepsis; Very slow to improve on atx.  -US neg for DVT; CT (4/13): neg for abscess; repeat CT (4/15): progression of cellulitis but no abscess  -clinically improving on IV atx; he received IV vanc+zosyn, changed to IV vanc on ly per ID on 4/15, then transitioned to PO cephalexin+doxy per ID recommendations;  2. Mood disorder in conditions classified elsewhere: Continue sertraline and Gabapentin.  3. Polysubstance dependence with physiological dependence This was about 2 years ago and was predominantly alcohol related per patient.  -prescribed short course percocet for acute pain; recommended to avoid overdose or abuse   D/w patient, offered HHC but he declined; I have recommended to f/u with PCP, wellness clinic in 3 days    Procedures:  none (i.e. Studies not automatically included, echos, thoracentesis, etc; not x-rays)  Consultations:  ID  Discharge Exam: Filed Vitals:   02/06/14 0558  BP: 152/94  Pulse: 88  Temp: 97.9 F (36.6 C)  Resp: 18    General: alert Cardiovascular: s1,s2 rrr Respiratory: CTA BL  Discharge Instructions  Discharge Orders   Future Orders Complete By Expires   Diet - low sodium heart healthy  As directed    Discharge instructions  As directed    Increase activity slowly  As directed        Medication List         cephALEXin 500 MG capsule  Commonly known as:  KEFLEX  Take 1 capsule (500 mg total) by mouth every 6 (six) hours.     doxycycline 100 MG tablet  Commonly known as:  VIBRA-TABS  Take 1 tablet (100 mg total) by mouth every 12 (twelve) hours.     gabapentin 300 MG capsule  Commonly known as:  NEURONTIN  Take 800 mg by mouth 4 (four) times daily. For anxiety/pain control     hydrocerin Crea  Apply 1 application topically 2 (two) times daily.     ibuprofen 200 MG tablet  Commonly known as:  ADVIL,MOTRIN  Take 600 mg by mouth every 6 (six) hours as needed for moderate pain.     lip balm ointment  Apply topically as needed for lip care.     oxyCODONE-acetaminophen 5-325 MG per tablet  Commonly known as:  PERCOCET/ROXICET  Take 2 tablets by mouth every 4 (four) hours as needed for severe pain.  sertraline 100 MG tablet  Commonly known as:  ZOLOFT  Take 150 mg by mouth daily. For depression     traZODone 150 MG tablet  Commonly known as:  DESYREL  Take 1 tablet (150 mg total) by mouth at bedtime and may repeat dose one time if needed. For depression/sleep       Allergies  Allergen Reactions  . Lithium Anaphylaxis  . Penicillins Rash       Follow-up Information   Follow up with No PCP Per Patient.   Specialty:  General Practice      Follow up with [facility]    . Schedule an appointment as soon  as possible for a visit in 3 days.   Contact information:   [address] [place] [address] [phone]       The results of significant diagnostics from this hospitalization (including imaging, microbiology, ancillary and laboratory) are listed below for reference.    Significant Diagnostic Studies: Ct Tibia Fibula Left W Contrast  02/03/2014   CLINICAL DATA:  Left lower leg cellulitis with slow improvement with antibiotic therapy. Evaluate for underlying abscess.  EXAM: CT OF THE LEFT TIBIA AND FIBULA WITH CONTRAST  TECHNIQUE: Multidetector CT imaging of the bilateral lower extremities was performed according to the standard protocol following intravenous contrast administration. Sagittal and coronal plane reformatted images were reconstructed from the axial CT data.  CONTRAST:  OMNIPAQUE IOHEXOL 300 MG/ML  SOLN  COMPARISON:  Prior examinations 02/01/2014.  FINDINGS: Diffuse subcutaneous edema in the left lower leg has mildly worsened compared with the prior study, especially anteriorly in the proximal lower leg. Again, edema tracks along the superficial fascial margin, but there is no focal fluid collection. Associated dermal thickening is stable. There is no abnormal intramuscular enhancement or fluid collection. No vascular abnormalities are identified.  There is no significant joint effusion. There is no evidence of bone destruction or foreign body. Mild spurring of both malleoli is noted at the ankle. Calcaneal spurring is stable.  IMPRESSION: 1. Mild progression of left lower leg cellulitis without focal fluid collection or evidence of myositis. 2. No evidence of osteomyelitis.   Electronically Signed   By: Roxy Horseman M.D.   On: 02/03/2014 12:11   Ct Tibia Fibula Left W Contrast  02/01/2014   CLINICAL DATA:  Left leg cellulitis.  Sepsis.  EXAM: CT OF THE LEFT TIBIA AND FIBULA WITH CONTRAST; CT OF THE LEFT FOOT WITH CONTRAST  TECHNIQUE: Multidetector CT imaging was  performed following the standard protocol during bolus administration of intravenous contrast.  CONTRAST:  OMNIPAQUE IOHEXOL 300 MG/ML  SOLN  COMPARISON:  None.  FINDINGS: CT tibia/fibula: Trace knee joint effusion without significant synovial enhancement. Subcutaneous edema extends from the distal thigh to the foot, with associated skin thickening particularly anteriorly, medially, and laterally. Subcutaneous edema tracks along the superficial fascia margin but no abscess is observed, and no definite muscular involvement is identified. No CT findings of osteomyelitis.  CT foot: No bony destructive findings characteristic of osteomyelitis. Subcutaneous edema tracks overlying the malleoli and within the dorsum of the foot. No drainable abscess observed. No definite muscular involvement. No malalignment at the Lisfranc joint.  Plantar and Achilles calcaneal spurs noted.  IMPRESSION: 1. Subcutaneous edema tracking in the calf and foot as detailed above, without discrete abscess, definite muscular involvement, or bony destructive findings characteristic of osteomyelitis.   Electronically Signed   By: Herbie Baltimore M.D.   On:  02/01/2014 12:24   Ct Foot Left W Contrast  02/01/2014   CLINICAL DATA:  Left leg cellulitis.  Sepsis.  EXAM: CT OF THE LEFT TIBIA AND FIBULA WITH CONTRAST; CT OF THE LEFT FOOT WITH CONTRAST  TECHNIQUE: Multidetector CT imaging was performed following the standard protocol during bolus administration of intravenous contrast.  CONTRAST:  OMNIPAQUE IOHEXOL 300 MG/ML  SOLN  COMPARISON:  None.  FINDINGS: CT tibia/fibula: Trace knee joint effusion without significant synovial enhancement. Subcutaneous edema extends from the distal thigh to the foot, with associated skin thickening particularly anteriorly, medially, and laterally. Subcutaneous edema tracks along the superficial fascia margin but no abscess is observed, and no definite muscular involvement is identified. No CT findings of  osteomyelitis.  CT foot: No bony destructive findings characteristic of osteomyelitis. Subcutaneous edema tracks overlying the malleoli and within the dorsum of the foot. No drainable abscess observed. No definite muscular involvement. No malalignment at the Lisfranc joint.  Plantar and Achilles calcaneal spurs noted.  IMPRESSION: 1. Subcutaneous edema tracking in the calf and foot as detailed above, without discrete abscess, definite muscular involvement, or bony destructive findings characteristic of osteomyelitis.   Electronically Signed   By: Herbie Baltimore M.D.   On: 02/01/2014 12:24    Microbiology: Recent Results (from the past 240 hour(s))  CULTURE, BLOOD (ROUTINE X 2)     Status: None   Collection Time    01/28/14 12:00 PM      Result Value Ref Range Status   Specimen Description BLOOD LEFT ARM   Final   Special Requests BOTTLES DRAWN AEROBIC AND ANAEROBIC 5CC   Final   Culture  Setup Time     Final   Value: 01/28/2014 14:07     Performed at [facility]   Culture     Final   Value: NO GROWTH 5 DAYS     Performed at [facility]   Report Status 02/03/2014 FINAL   Final  CULTURE, BLOOD (ROUTINE X 2)     Status: None   Collection Time    01/28/14 12:10 PM      Result Value Ref Range Status   Specimen Description BLOOD LEFT HAND   Final   Special Requests BOTTLES DRAWN AEROBIC AND ANAEROBIC 5CC   Final   Culture  Setup Time     Final   Value: 01/28/2014 14:07     Performed at [facility]   Culture     Final   Value: NO GROWTH 5 DAYS     Performed at [facility]   Report Status 02/03/2014 FINAL   Final  MRSA PCR SCREENING     Status: Abnormal   Collection Time    01/29/14  8:16 PM      Result Value Ref Range Status   MRSA by PCR INVALID RESULTS, SPECIMEN SENT FOR CULTURE (*) NEGATIVE Final   Comment: RESULT CALLED TO, READ BACK BY AND VERIFIED WITH:     SWALTON RN AT 0145 ON [id number] BY DLONG                The GeneXpert MRSA Assay (FDA      approved for NASAL specimens     only), is one component of a     comprehensive MRSA colonization     surveillance program. It is not     intended to diagnose MRSA     infection nor to guide or     monitor treatment for  MRSA infections.  MRSA CULTURE     Status: None   Collection Time    01/29/14  8:16 PM      Result Value Ref Range Status   Specimen Description NOSE   Final   Special Requests NONE   Final   Culture     Final   Value: NO STAPHYLOCOCCUS AUREUS ISOLATED     Note: No MRSA Isolated     [facility]formed at [facility]   Report Status 02/01/2014 FINAL   Final     Labs: Basic Metabolic Panel:  Recent Labs Lab 01/31/14 0620 02/02/14 0410 02/03/14 0505  NA 135* 140 141  K 4.4 4.2 4.6  CL 98 103 103  CO2 26 29 28   GLUCOSE 100* 108* 98  BUN 8 10 10   CREATININE 0.65 0.72 0.68  CALCIUM 8.6 8.7 9.1   Liver Function Tests: No results found for this basename: AST, ALT, ALKPHOS, BILITOT, PROT, ALBUMIN,  in the last 168 hours No results found for this basename: LIPASE, AMYLASE,  in the last 168 hours No results found for this basename: AMMONIA,  in the last 168 hours CBC:  Recent Labs Lab 01/31/14 0620 02/01/14 0545 02/02/14 0410 02/03/14 0505 02/04/14 0545  WBC 24.2* 15.7* 13.5* 17.0* 17.2*  HGB 13.4 13.0 13.0 13.0 12.7*  HCT 39.4 38.4* 38.2* 37.9* 37.2*  MCV 94.0 94.8 92.9 92.4 93.0  PLT 227 218 239 264 273   Cardiac Enzymes: No results found for this basename: CKTOTAL, CKMB, CKMBINDEX, TROPONINI,  in the last 168 hours BNP: BNP (last 3 results) No results found for this basename: PROBNP,  in the last 8760 hours CBG: No results found for this basename: GLUCAP,  in the last 168 hours     Signed:  Esperanza SheetsUlugbek N Alvar Malinoski  [facility] 02/06/2014, 8:40 AM

## 2014-02-06 NOTE — Progress Notes (Addendum)
PICC line d/c'ed per order for d/c home.  Pt verbalizes understanding of need to stay in bed for 30 minutes, dressing care, signs of infection via teach back method.  No ADN.  Site without signs of infection or bleeding noted. Lupita Leashonna RN at bedside in room also aware of instructions.

## 2014-02-15 ENCOUNTER — Ambulatory Visit: Payer: Medicare Other | Attending: Internal Medicine | Admitting: Internal Medicine

## 2014-02-15 ENCOUNTER — Encounter: Payer: Self-pay | Admitting: Internal Medicine

## 2014-02-15 VITALS — BP 131/83 | HR 85 | Temp 98.0°F | Resp 16 | Ht 69.0 in | Wt 226.8 lb

## 2014-02-15 DIAGNOSIS — L03119 Cellulitis of unspecified part of limb: Secondary | ICD-10-CM

## 2014-02-15 DIAGNOSIS — F17200 Nicotine dependence, unspecified, uncomplicated: Secondary | ICD-10-CM | POA: Insufficient documentation

## 2014-02-15 DIAGNOSIS — L03116 Cellulitis of left lower limb: Secondary | ICD-10-CM

## 2014-02-15 DIAGNOSIS — R197 Diarrhea, unspecified: Secondary | ICD-10-CM | POA: Insufficient documentation

## 2014-02-15 DIAGNOSIS — B1920 Unspecified viral hepatitis C without hepatic coma: Secondary | ICD-10-CM | POA: Insufficient documentation

## 2014-02-15 DIAGNOSIS — M545 Low back pain, unspecified: Secondary | ICD-10-CM | POA: Insufficient documentation

## 2014-02-15 DIAGNOSIS — L02419 Cutaneous abscess of limb, unspecified: Secondary | ICD-10-CM | POA: Insufficient documentation

## 2014-02-15 DIAGNOSIS — Z09 Encounter for follow-up examination after completed treatment for conditions other than malignant neoplasm: Secondary | ICD-10-CM | POA: Insufficient documentation

## 2014-02-15 MED ORDER — TRAMADOL HCL 50 MG PO TABS: 50.0000 mg | ORAL_TABLET | Freq: Two times a day (BID) | ORAL | Status: DC

## 2014-02-15 NOTE — Progress Notes (Signed)
HFU  Pt to establish care Still concerned about left leg cellulitis  Pt c/o chronic low back pain

## 2014-02-15 NOTE — Progress Notes (Signed)
Patient ID: [name] [name], male   DOB: July 08, 1976, 38 y.o.   MRN: [id number]    [id number]  [id number]  DOB - July 08, 1976  CC:  Chief Complaint  Patient presents with  . Hospitalization Follow-up, Cellulitis       HPI: [name] [name] is a 38 y.o. male here today as a hospital follow up of cellulitis of the left leg. Patient was admitted in to hospital on 4/8-4/18 for cellulitis and sepsis.  Patient states that he was given multiple antibiotics inpatient and was discharged home with a course of Keflex and doxycycline tablets.  He reports that he finished both antibiotics on Wednesday of last week.  He states that the leg looks much better and does not hurt as bad but he is concerned about the erythema and swelling.  Patient reports he has returned to work and notices more swelling and pain after standing.  He also reports mild diarrhea with 2-3 episodes per day that are a loose consistency.  Review of patient medical charts reveal a recent negative venous duplex of the left lower extremity. Patient also reports mild constant back pain that he has had "for years".  He state that NSAID's do not help the pain and he is afraid to take narcotics because of history of drug and alcohol abuse.  Patient states that it is a sharp pain in his mid lower back that does not radiate. He does note that with back pain he gets a tingling sensation in his right fingertips. Denies bowel or bladder dysfunction.  Allergies  Allergen Reactions  . Lithium Anaphylaxis  . Penicillins Rash   Past Medical History  Diagnosis Date  . Bipolar disorder   . Hepatitis C   . Depression    Current Outpatient Prescriptions on File Prior to Visit  Medication Sig Dispense Refill  . gabapentin (NEURONTIN) 300 MG capsule Take 800 mg by mouth 4 (four) times daily. For anxiety/pain control      . hydrocerin (EUCERIN) CREA Apply 1 application topically 2 (two) times daily.  50 g  0  . ibuprofen (ADVIL,MOTRIN) 200 MG tablet Take  600 mg by mouth every 6 (six) hours as needed for moderate pain.      Marland Kitchen. sertraline (ZOLOFT) 100 MG tablet Take 150 mg by mouth daily. For depression      . traZODone (DESYREL) 150 MG tablet Take 1 tablet (150 mg total) by mouth at bedtime and may repeat dose one time if needed. For depression/sleep  30 tablet  0  . cephALEXin (KEFLEX) 500 MG capsule Take 1 capsule (500 mg total) by mouth every 6 (six) hours.  20 capsule  0  . doxycycline (VIBRA-TABS) 100 MG tablet Take 1 tablet (100 mg total) by mouth every 12 (twelve) hours.  10 tablet  0  . lip balm (CARMEX) ointment Apply topically as needed for lip care.  7 g  0  . oxyCODONE-acetaminophen (PERCOCET/ROXICET) 5-325 MG per tablet Take 2 tablets by mouth every 4 (four) hours as needed for severe pain.  30 tablet  0   No current facility-administered medications on file prior to visit.   Family History  Problem Relation Age of Onset  . Alcohol abuse Father   . Cancer Maternal Aunt    History   Social History  . Marital Status: Legally Separated    Spouse Name: N/A    Number of Children: N/A  . Years of Education: N/A   Occupational History  . Not on file.  Social History Main Topics  . Smoking status: Current Every Day Smoker    Types: Cigarettes  . Smokeless tobacco: Not on file  . Alcohol Use: Yes     Comment: Patient reports using THC, Xanax, Heroin, and Alcohol   . Drug Use: Yes    Special: Marijuana, Benzodiazepines, Other-see comments, Methamphetamines  . Sexual Activity: Not Currently   Other Topics Concern  . Not on file   Social History Narrative  . No narrative on file    Review of Systems: Constitutional: Negative for fever, chills, diaphoresis, activity change, appetite change and fatigue. HENT: Negative for ear pain, nosebleeds, congestion, facial swelling, rhinorrhea, neck pain, neck stiffness and ear discharge.  Eyes: Negative for pain, discharge, redness, itching and visual disturbance. Respiratory:  Negative for cough, choking, chest tightness, shortness of breath, wheezing and stridor.  Cardiovascular: Negative for chest pain, palpitations. +LLE swelling, negative for pain  Gastrointestinal: Negative for abdominal distention. Genitourinary: Negative for dysuria, urgency, frequency, hematuria, flank pain, decreased urine volume, difficulty urinating and dyspareunia.  Musculoskeletal: Negative for joint swelling, arthralgia and gait problem. +Chronic back pain Neurological: Negative for dizziness, tremors, seizures, syncope, facial asymmetry, speech difficulty, weakness, light-headedness and headaches. +numbness in right fingertips Hematological: Negative for adenopathy. Does not bruise/bleed easily. Psychiatric/Behavioral: Negative for hallucinations, behavioral problems, confusion, dysphoric mood, decreased concentration and agitation.    Objective:   Filed Vitals:   02/15/14 1013  BP: 131/83  Pulse: 85  Temp: 98 F (36.7 C)  Resp: 16    Physical Exam: Constitutional: Patient appears well-developed and well-nourished. No distress. HENT: Normocephalic, atraumatic, External right and left ear normal. Oropharynx is clear and moist.  Eyes: Conjunctivae and EOM are normal. PERRLA, no scleral icterus. Neck: Normal ROM. Neck supple. No JVD. No tracheal deviation. No thyromegaly. CVS: RRR, S1/S2 +, no murmurs, no gallops, no carotid bruit.  Pulmonary: Effort and breath sounds normal, no stridor, rhonchi, wheezes, rales.  Abdominal: Soft. BS +, no distension, rebound or guarding. + LUQ tenderness and mild liver enlargement  Musculoskeletal: Pain with ROM in LLE, no tenderness. +LLE swelling with 2+ pitting edema, sluggish capillary refill in LLE. No point tenderness of lumbar spine Lymphadenopathy: No lymphadenopathy noted, cervical Neuro: Alert. Normal reflexes, muscle tone coordination. No cranial nerve deficit. Skin: Skin is warm and dry. + scabbed over abrasion on LLE, erythematous  with slow color return on palpitation of LLE Psychiatric: Normal mood and affect. Behavior, judgment, thought content normal.  Lab Results  Component Value Date   WBC 17.2* 02/04/2014   HGB 12.7* 02/04/2014   HCT 37.2* 02/04/2014   MCV 93.0 02/04/2014   PLT 273 02/04/2014   Lab Results  Component Value Date   CREATININE 0.68 02/03/2014   BUN 10 02/03/2014   NA 141 02/03/2014   K 4.6 02/03/2014   CL 103 02/03/2014   CO2 28 02/03/2014    No results found for this basename: HGBA1C   Lipid Panel  No results found for this basename: chol, trig, hdl, cholhdl, vldl, ldlcalc       Assessment and plan:   [name] [name] was seen today for hospitalization follow-up.  Diagnoses and associated orders for this visit:  Cellulitis of leg, left Follow up in 2 weeks to check progress. Keep leg elevated as much as possible  Hepatitis C - Ambulatory referral to Infectious Disease  Lumbar pain - DG Lumbar Spine Complete; Future - Ambulatory referral to Pain Clinic Patient reports that he is afraid to use narcotic pain medication  because it was addicted to alcohol and numerous drugs. He claims sobriety for 1 year.  State NSAIDs and Tylenol did not help his pain  Smoking Patient is not ready to quit, but smoking cessation information given to patient. Will continue to assess on each visit  Diarrhea Likely from antibiotic use. Will continue to monitor. If still having diarrhea in one week with abdominal pain, RTC.   Other Orders - traMADol (ULTRAM) 50 MG tablet; Take 1 tablet (50 mg total) by mouth 2 (two) times daily.     Return in about 2 weeks (around 03/01/2014) for f/u of cellulitis.  Will check progress of leg on next visit.  Educated patient to keep leg elevated above the level of the heart as much as possible.  Baseline labs needed next visit  The patient was given clear instructions to go to ER or return to medical center if symptoms don't improve, worsen or new problems develop.  Holland Commons, NP-C [facility] [phone] 02/15/2014, 1:41 PM

## 2014-02-15 NOTE — Patient Instructions (Signed)
Cellulitis Cellulitis is an infection of the skin and the tissue beneath it. The infected area is usually red and tender. Cellulitis occurs most often in the arms and lower legs.  CAUSES  Cellulitis is caused by bacteria that enter the skin through cracks or cuts in the skin. The most common types of bacteria that cause cellulitis are Staphylococcus and Streptococcus. SYMPTOMS   Redness and warmth.  Swelling.  Tenderness or pain.  Fever. DIAGNOSIS  Your caregiver can usually determine what is wrong based on a physical exam. Blood tests may also be done. TREATMENT  Treatment usually involves taking an antibiotic medicine. HOME CARE INSTRUCTIONS   Take your antibiotics as directed. Finish them even if you start to feel better.  Keep the infected arm or leg elevated to reduce swelling.  Apply a warm cloth to the affected area up to 4 times per day to relieve pain.  Only take over-the-counter or prescription medicines for pain, discomfort, or fever as directed by your caregiver.  Keep all follow-up appointments as directed by your caregiver. SEEK MEDICAL CARE IF:   You notice red streaks coming from the infected area.  Your red area gets larger or turns dark in color.  Your bone or joint underneath the infected area becomes painful after the skin has healed.  Your infection returns in the same area or another area.  You notice a swollen bump in the infected area.  You develop new symptoms. SEEK IMMEDIATE MEDICAL CARE IF:   You have a fever.  You feel very sleepy.  You develop vomiting or diarrhea.  You have a general ill feeling (malaise) with muscle aches and pains. MAKE SURE YOU:   Understand these instructions.  Will watch your condition.  Will get help right away if you are not doing well or get worse. Document Released: 07/18/2005 Document Revised: 04/08/2012 Document Reviewed: 12/24/2011 [facility] Patient Information 2014 [place], [place]. Smoking  Cessation Quitting smoking is important to your health and has many advantages. However, it is not always easy to quit since nicotine is a very addictive drug. Often times, people try 3 times or more before being able to quit. This document explains the best ways for you to prepare to quit smoking. Quitting takes hard work and a lot of effort, but you can do it. ADVANTAGES OF QUITTING SMOKING  You will live longer, feel better, and live better.  Your body will feel the impact of quitting smoking almost immediately.  Within 20 minutes, blood pressure decreases. Your pulse returns to its normal level.  After 8 hours, carbon monoxide levels in the blood return to normal. Your oxygen level increases.  After 24 hours, the chance of having a heart attack starts to decrease. Your breath, hair, and body stop smelling like smoke.  After 48 hours, damaged nerve endings begin to recover. Your sense of taste and smell improve.  After 72 hours, the body is virtually free of nicotine. Your bronchial tubes relax and breathing becomes easier.  After 2 to 12 weeks, lungs can hold more air. Exercise becomes easier and circulation improves.  The risk of having a heart attack, stroke, cancer, or lung disease is greatly reduced.  After 1 year, the risk of coronary heart disease is cut in half.  After 5 years, the risk of stroke falls to the same as a nonsmoker.  After 10 years, the risk of lung cancer is cut in half and the risk of other cancers decreases significantly.  After 15 years,  the risk of coronary heart disease drops, usually to the level of a nonsmoker.  If you are pregnant, quitting smoking will improve your chances of having a healthy baby.  The people you live with, especially any children, will be healthier.  You will have extra money to spend on things other than cigarettes. QUESTIONS TO THINK ABOUT BEFORE ATTEMPTING TO QUIT You may want to talk about your answers with your  caregiver.  Why do you want to quit?  If you tried to quit in the past, what helped and what did not?  What will be the most difficult situations for you after you quit? How will you plan to handle them?  Who can help you through the tough times? Your family? Friends? A caregiver?  What pleasures do you get from smoking? What ways can you still get pleasure if you quit? Here are some questions to ask your caregiver:  How can you help me to be successful at quitting?  What medicine do you think would be best for me and how should I take it?  What should I do if I need more help?  What is smoking withdrawal like? How can I get information on withdrawal? GET READY  Set a quit date.  Change your environment by getting rid of all cigarettes, ashtrays, matches, and lighters in your home, car, or work. Do not let people smoke in your home.  Review your past attempts to quit. Think about what worked and what did not. GET SUPPORT AND ENCOURAGEMENT You have a better chance of being successful if you have help. You can get support in many ways.  Tell your family, friends, and co-workers that you are going to quit and need their support. Ask them not to smoke around you.  Get individual, group, or telephone counseling and support. Programs are available at [facility]. Call your local health department for information about programs in your area.  Spiritual beliefs and practices may help some smokers quit.  Download a "quit meter" on your computer to keep track of quit statistics, such as how long you have gone without smoking, cigarettes not smoked, and money saved.  Get a self-help book about quitting smoking and staying off of tobacco. LEARN NEW SKILLS AND BEHAVIORS  Distract yourself from urges to smoke. Talk to someone, go for a walk, or occupy your time with a task.  Change your normal routine. Take a different route to work. Drink tea instead of coffee.  Eat breakfast in a different place.  Reduce your stress. Take a hot bath, exercise, or read a book.  Plan something enjoyable to do every day. Reward yourself for not smoking.  Explore interactive web-based programs that specialize in helping you quit. GET MEDICINE AND USE IT CORRECTLY Medicines can help you stop smoking and decrease the urge to smoke. Combining medicine with the above behavioral methods and support can greatly increase your chances of successfully quitting smoking.  Nicotine replacement therapy helps deliver nicotine to your body without the negative effects and risks of smoking. Nicotine replacement therapy includes nicotine gum, lozenges, inhalers, nasal sprays, and skin patches. Some may be available over-the-counter and others require a prescription.  Antidepressant medicine helps people abstain from smoking, but how this works is unknown. This medicine is available by prescription.  Nicotinic receptor partial agonist medicine simulates the effect of nicotine in your brain. This medicine is available by prescription. Ask your caregiver for advice about which medicines to use  and how to use them based on your health history. Your caregiver will tell you what side effects to look out for if you choose to be on a medicine or therapy. Carefully read the information on the package. Do not use any other product containing nicotine while using a nicotine replacement product.  RELAPSE OR DIFFICULT SITUATIONS Most relapses occur within the first 3 months after quitting. Do not be discouraged if you start smoking again. Remember, most people try several times before finally quitting. You may have symptoms of withdrawal because your body is used to nicotine. You may crave cigarettes, be irritable, feel very hungry, cough often, get headaches, or have difficulty concentrating. The withdrawal symptoms are only temporary. They are strongest when you first quit, but they will go away within  10 14 days. To reduce the chances of relapse, try to:  Avoid drinking alcohol. Drinking lowers your chances of successfully quitting.  Reduce the amount of caffeine you consume. Once you quit smoking, the amount of caffeine in your body increases and can give you symptoms, such as a rapid heartbeat, sweating, and anxiety.  Avoid smokers because they can make you want to smoke.  Do not let weight gain distract you. Many smokers will gain weight when they quit, usually less than 10 pounds. Eat a healthy diet and stay active. You can always lose the weight gained after you quit.  Find ways to improve your mood other than smoking. FOR MORE INFORMATION  www.smokefree.gov  Document Released: 10/02/2001 Document Revised: 04/08/2012 Document Reviewed: 01/17/2012 [facility] Patient Information 2014 [place]. Back Pain, Adult Low back pain is very common. About 1 in 5 people have back pain.The cause of low back pain is rarely dangerous. The pain often gets better over time.About half of people with a sudden onset of back pain feel better in just 2 weeks. About 8 in 10 people feel better by 6 weeks.  CAUSES Some common causes of back pain include:  Strain of the muscles or ligaments supporting the spine.  Wear and tear (degeneration) of the spinal discs.  Arthritis.  Direct injury to the back. DIAGNOSIS Most of the time, the direct cause of low back pain is not known.However, back pain can be treated effectively even when the exact cause of the pain is unknown.Answering your caregiver's questions about your overall health and symptoms is one of the most accurate ways to make sure the cause of your pain is not dangerous. If your caregiver needs more information, he or she may order lab work or imaging tests (X-rays or MRIs).However, even if imaging tests show changes in your back, this usually does not require surgery. HOME CARE INSTRUCTIONS For many people, back pain returns.Since low  back pain is rarely dangerous, it is often a condition that people can learn to [facility] their own.   Remain active. It is stressful on the back to sit or stand in one place. Do not sit, drive, or stand in one place for more than 30 minutes at a time. Take short walks on level surfaces as soon as pain allows.Try to increase the length of time you walk each day.  Do not stay in bed.Resting more than 1 or 2 days can delay your recovery.  Do not avoid exercise or work.Your body is made to move.It is not dangerous to be active, even though your back may hurt.Your back will likely heal faster if you return to being active before your pain is gone.  Pay attention  to your body when you bend and lift. Many people have less discomfortwhen lifting if they bend their knees, keep the load close to their bodies,and avoid twisting. Often, the most comfortable positions are those that put less stress on your recovering back.  Find a comfortable position to sleep. Use a firm mattress and lie on your side with your knees slightly bent. If you lie on your back, put a pillow under your knees.  Only take over-the-counter or prescription medicines as directed by your caregiver. Over-the-counter medicines to reduce pain and inflammation are often the most helpful.Your caregiver may prescribe muscle relaxant drugs.These medicines help dull your pain so you can more quickly return to your normal activities and healthy exercise.  Put ice on the injured area.  Put ice in a plastic bag.  Place a towel between your skin and the bag.  Leave the ice on for 15-20 minutes, 03-04 times a day for the first 2 to 3 days. After that, ice and heat may be alternated to reduce pain and spasms.  Ask your caregiver about trying back exercises and gentle massage. This may be of some benefit.  Avoid feeling anxious or stressed.Stress increases muscle tension and can worsen back pain.It is important to recognize when you  are anxious or stressed and learn ways to manage it.Exercise is a great option. SEEK MEDICAL CARE IF:  You have pain that is not relieved with rest or medicine.  You have pain that does not improve in 1 week.  You have new symptoms.  You are generally not feeling well. SEEK IMMEDIATE MEDICAL CARE IF:   You have pain that radiates from your back into your legs.  You develop new bowel or bladder control problems.  You have unusual weakness or numbness in your arms or legs.  You develop nausea or vomiting.  You develop abdominal pain.  You feel faint. Document Released: 10/08/2005 Document Revised: 04/08/2012 Document Reviewed: 02/26/2011 [facility] Patient Information 2014 [place], [place].

## 2014-02-25 ENCOUNTER — Other Ambulatory Visit: Payer: Medicare Other

## 2014-02-25 DIAGNOSIS — B182 Chronic viral hepatitis C: Secondary | ICD-10-CM

## 2014-02-26 LAB — HCV RNA QUANT RFLX ULTRA OR GENOTYP
HCV Quantitative Log: 6.34 {Log} — ABNORMAL HIGH (ref <1.18)
HCV Quantitative: 2177016 IU/mL — ABNORMAL HIGH (ref <15)

## 2014-03-02 ENCOUNTER — Ambulatory Visit: Payer: Medicare Other | Attending: Internal Medicine | Admitting: Internal Medicine

## 2014-03-02 ENCOUNTER — Encounter: Payer: Self-pay | Admitting: Internal Medicine

## 2014-03-02 VITALS — BP 123/75 | HR 98 | Temp 98.0°F | Resp 16 | Ht 70.0 in | Wt 230.6 lb

## 2014-03-02 DIAGNOSIS — R609 Edema, unspecified: Secondary | ICD-10-CM | POA: Insufficient documentation

## 2014-03-02 DIAGNOSIS — M545 Low back pain, unspecified: Secondary | ICD-10-CM | POA: Insufficient documentation

## 2014-03-02 DIAGNOSIS — F17200 Nicotine dependence, unspecified, uncomplicated: Secondary | ICD-10-CM | POA: Insufficient documentation

## 2014-03-02 MED ORDER — FUROSEMIDE 20 MG PO TABS: 20.0000 mg | ORAL_TABLET | Freq: Every day | ORAL | Status: DC

## 2014-03-02 MED ORDER — TRAMADOL HCL 50 MG PO TABS: 50.0000 mg | ORAL_TABLET | Freq: Two times a day (BID) | ORAL | Status: DC

## 2014-03-02 NOTE — Progress Notes (Signed)
Patient is here for F/U on LLE cellulitis. States he has not been able to rest leg as he works in [employer] Also C/O left-sided low back pain due to his work in [employer]. States he has not yet gone to get the X-Ray

## 2014-03-02 NOTE — Progress Notes (Signed)
Patient ID: [name] [name], male   DOB: 23-Mar-1976, 38 y.o.   MRN: [id number]  CC: two-week followup for cellulitis  HPI: Patient presents to clinic today for his two-week followup on a recent hospital discharge for left lower extremity cellulitis.  Patient was given antibiotics inpatient and discharged with Keflex and doxycycline which she has completed both. Today he complaints of tightness and swelling in left lower extremity. He denies pain, fever, chills, or warmth in the leg.  Patient does admit that he has not had time to rest his leg do to work schedule. He reports that he attempts to elevate his leg after work daily but that only for 2 hours per day.  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 Today he leg looks substantially better with decreased swelling from previous visit 2 weeks ago.   Allergies  Allergen Reactions  . Lithium Anaphylaxis  . Penicillins Rash   Past Medical History  Diagnosis Date  . Bipolar disorder   . Hepatitis C   . Depression    Current Outpatient Prescriptions on File Prior to Visit  Medication Sig Dispense Refill  . gabapentin (NEURONTIN) 300 MG capsule Take 800 mg by mouth 4 (four) times daily. For anxiety/pain control      . hydrocerin (EUCERIN) CREA Apply 1 application topically 2 (two) times daily.  50 g  0  . ibuprofen (ADVIL,MOTRIN) 200 MG tablet Take 600 mg by mouth every 6 (six) hours as needed for moderate pain.      Marland Kitchen. sertraline (ZOLOFT) 100 MG tablet Take 150 mg by mouth daily. For depression      . traZODone (DESYREL) 150 MG tablet Take 1 tablet (150 mg total) by mouth at bedtime and may repeat dose one time if needed. For depression/sleep  30 tablet  0  . cephALEXin (KEFLEX) 500 MG capsule Take 1 capsule (500 mg total) by mouth every 6 (six) hours.  20 capsule  0  . doxycycline (VIBRA-TABS) 100 MG tablet Take 1 tablet (100 mg total) by mouth every 12 (twelve) hours.  10 tablet  0  . lip  balm (CARMEX) ointment Apply topically as needed for lip care.  7 g  0  . oxyCODONE-acetaminophen (PERCOCET/ROXICET) 5-325 MG per tablet Take 2 tablets by mouth every 4 (four) hours as needed for severe pain.  30 tablet  0   No current facility-administered medications on file prior to visit.   Family History  Problem Relation Age of Onset  . Alcohol abuse Father   . Cancer Maternal Aunt    History   Social History  . Marital Status: Legally Separated    Spouse Name: N/A    Number of Children: N/A  . Years of Education: N/A   Occupational History  . Not on file.   Social History Main Topics  . Smoking status: Current Every Day Smoker    Types: Cigarettes  . Smokeless tobacco: Not on file  . Alcohol Use: Yes     Comment: Patient reports using THC, Xanax, Heroin, and Alcohol   . Drug Use: Yes    Special: Marijuana, Benzodiazepines, Other-see comments, Methamphetamines  . Sexual Activity: Not Currently   Other Topics Concern  . Not on file   Social History Narrative  . No narrative on file   Review of Systems  Constitutional: Negative for fever, chills and diaphoresis.  HENT: Negative.   Respiratory: Negative.   Cardiovascular: Negative.  Gastrointestinal: Negative.   Musculoskeletal: Positive for back pain.       Swelling in LLE, patient states that he has not had time to rest the leg d/t work schedule  Skin: Positive for rash (old abrasions on LLE). Negative for itching.      Objective:   Filed Vitals:   03/02/14 1406  BP: 123/75  Pulse: 98  Temp: 98 F (36.7 C)  Resp: 16    Physical Exam: Constitutional: Patient appears well-developed and well-nourished. No distress. Neck: Normal ROM. Neck supple. No JVD. No tracheal deviation. No thyromegaly. CVS: RRR, S1/S2 +, no murmurs, no gallops, no carotid bruit.  Pulmonary: Effort and breath sounds normal, no stridor, rhonchi, wheezes, rales.  Abdominal: Soft. BS +,  no distension, tenderness,   Musculoskeletal: Normal range of motion. 2+ non pitting edema without tenderness. Moderate swelling and tightness in calf----negative venous US last month. 2+ pedal pulses, sluggish capillary refill. Lymphadenopathy: No lymphadenopathy noted Skin: Skin is warm and dry. Minimal erythema to LLE, improved color.  Psychiatric: Normal mood and affect. Behavior, judgment, thought content normal.  Lab Results  Component Value Date   WBC 17.2* 02/04/2014   HGB 12.7* 02/04/2014   HCT 37.2* 02/04/2014   MCV 93.0 02/04/2014   PLT 273 02/04/2014   Lab Results  Component Value Date   CREATININE 0.68 02/03/2014   BUN 10 02/03/2014   NA 141 02/03/2014   K 4.6 02/03/2014   CL 103 02/03/2014   CO2 28 02/03/2014    No results found for this basename: HGBA1C   Lipid Panel  No results found for this basename: chol, trig, hdl, cholhdl, vldl, ldlcalc       Assessment and plan:   Jillyn HiddenGary was seen today for follow-up and cellulitis.  Diagnoses and associated orders for this visit:  Edema - furosemide (LASIX) 20 MG tablet; Take 1 tablet (20 mg total) by mouth daily.  The patient this may help the swelling in left lower extremity.  Lumbar pain - traMADol (ULTRAM) 50 MG tablet; Take 1 tablet (50 mg total) by mouth 2 (two) times daily refilled Waiting for patient x-rays and referral to pain management clinic  Tobacco use disorder Smoking cessation information provided.  Discussed important signs and symptoms the patient should be mindful of that should warrant further evaluation. Discussed when patient should return to clinic/emergency department. Patient verbalized understanding.  Holland CommonsValerie Keck, NP-C [facility] [phone] 03/02/2014, 2:44 PM

## 2014-03-02 NOTE — Patient Instructions (Signed)
Smoking Cessation Quitting smoking is important to your health and has many advantages. However, it is not always easy to quit since nicotine is a very addictive drug. Often times, people try 3 times or more before being able to quit. This document explains the best ways for you to prepare to quit smoking. Quitting takes hard work and a lot of effort, but you can do it. ADVANTAGES OF QUITTING SMOKING  You will live longer, feel better, and live better.  Your body will feel the impact of quitting smoking almost immediately.  Within 20 minutes, blood pressure decreases. Your pulse returns to its normal level.  After 8 hours, carbon monoxide levels in the blood return to normal. Your oxygen level increases.  After 24 hours, the chance of having a heart attack starts to decrease. Your breath, hair, and body stop smelling like smoke.  After 48 hours, damaged nerve endings begin to recover. Your sense of taste and smell improve.  After 72 hours, the body is virtually free of nicotine. Your bronchial tubes relax and breathing becomes easier.  After 2 to 12 weeks, lungs can hold more air. Exercise becomes easier and circulation improves.  The risk of having a heart attack, stroke, cancer, or lung disease is greatly reduced.  After 1 year, the risk of coronary heart disease is cut in half.  After 5 years, the risk of stroke falls to the same as a nonsmoker.  After 10 years, the risk of lung cancer is cut in half and the risk of other cancers decreases significantly.  After 15 years, the risk of coronary heart disease drops, usually to the level of a nonsmoker.  If you are pregnant, quitting smoking will improve your chances of having a healthy baby.  The people you live with, especially any children, will be healthier.  You will have extra money to spend on things other than cigarettes. QUESTIONS TO THINK ABOUT BEFORE ATTEMPTING TO QUIT You may want to talk about your answers with your  caregiver.  Why do you want to quit?  If you tried to quit in the past, what helped and what did not?  What will be the most difficult situations for you after you quit? How will you plan to handle them?  Who can help you through the tough times? Your family? Friends? A caregiver?  What pleasures do you get from smoking? What ways can you still get pleasure if you quit? Here are some questions to ask your caregiver:  How can you help me to be successful at quitting?  What medicine do you think would be best for me and how should I take it?  What should I do if I need more help?  What is smoking withdrawal like? How can I get information on withdrawal? GET READY  Set a quit date.  Change your environment by getting rid of all cigarettes, ashtrays, matches, and lighters in your home, car, or work. Do not let people smoke in your home.  Review your past attempts to quit. Think about what worked and what did not. GET SUPPORT AND ENCOURAGEMENT You have a better chance of being successful if you have help. You can get support in many ways.  Tell your family, friends, and co-workers that you are going to quit and need their support. Ask them not to smoke around you.  Get individual, group, or telephone counseling and support. Programs are available at local hospitals and health centers. Call your local health department for   information about programs in your area.  Spiritual beliefs and practices may help some smokers quit.  Download a "quit meter" on your computer to keep track of quit statistics, such as how long you have gone without smoking, cigarettes not smoked, and money saved.  Get a self-help book about quitting smoking and staying off of tobacco. LEARN NEW SKILLS AND BEHAVIORS  Distract yourself from urges to smoke. Talk to someone, go for a walk, or occupy your time with a task.  Change your normal routine. Take a different route to work. Drink tea instead of coffee.  Eat breakfast in a different place.  Reduce your stress. Take a hot bath, exercise, or read a book.  Plan something enjoyable to do every day. Reward yourself for not smoking.  Explore interactive web-based programs that specialize in helping you quit. GET MEDICINE AND USE IT CORRECTLY Medicines can help you stop smoking and decrease the urge to smoke. Combining medicine with the above behavioral methods and support can greatly increase your chances of successfully quitting smoking.  Nicotine replacement therapy helps deliver nicotine to your body without the negative effects and risks of smoking. Nicotine replacement therapy includes nicotine gum, lozenges, inhalers, nasal sprays, and skin patches. Some may be available over-the-counter and others require a prescription.  Antidepressant medicine helps people abstain from smoking, but how this works is unknown. This medicine is available by prescription.  Nicotinic receptor partial agonist medicine simulates the effect of nicotine in your brain. This medicine is available by prescription. Ask your caregiver for advice about which medicines to use and how to use them based on your health history. Your caregiver will tell you what side effects to look out for if you choose to be on a medicine or therapy. Carefully read the information on the package. Do not use any other product containing nicotine while using a nicotine replacement product.  RELAPSE OR DIFFICULT SITUATIONS Most relapses occur within the first 3 months after quitting. Do not be discouraged if you start smoking again. Remember, most people try several times before finally quitting. You may have symptoms of withdrawal because your body is used to nicotine. You may crave cigarettes, be irritable, feel very hungry, cough often, get headaches, or have difficulty concentrating. The withdrawal symptoms are only temporary. They are strongest when you first quit, but they will go away within  10 14 days. To reduce the chances of relapse, try to:  Avoid drinking alcohol. Drinking lowers your chances of successfully quitting.  Reduce the amount of caffeine you consume. Once you quit smoking, the amount of caffeine in your body increases and can give you symptoms, such as a rapid heartbeat, sweating, and anxiety.  Avoid smokers because they can make you want to smoke.  Do not let weight gain distract you. Many smokers will gain weight when they quit, usually less than 10 pounds. Eat a healthy diet and stay active. You can always lose the weight gained after you quit.  Find ways to improve your mood other than smoking. FOR MORE INFORMATION  www.smokefree.gov  Document Released: 10/02/2001 Document Revised: 04/08/2012 Document Reviewed: 01/17/2012 [facility] Patient Information 2014 [place], [place]. Edema Edema is an abnormal build-up of fluids in tissues. Because this is partly dependent on gravity (water flows to the lowest place), it is more common in the legs and thighs (lower extremities). It is also common in the looser tissues, like around the eyes. Painless swelling of the feet and ankles is common and increases as  a person ages. It may affect both legs and may include the calves or even thighs. When squeezed, the fluid may move out of the affected area and may leave a dent for a few moments. CAUSES   Prolonged standing or sitting in one place for extended periods of time. Movement helps pump tissue fluid into the veins, and absence of movement prevents this, resulting in edema.  Varicose veins. The valves in the veins do not work as well as they should. This causes fluid to leak into the tissues.  Fluid and salt overload.  Injury, burn, or surgery to the leg, ankle, or foot, may damage veins and allow fluid to leak out.  Sunburn damages vessels. Leaky vessels allow fluid to go out into the sunburned tissues.  Allergies (from insect bites or stings, medications or  chemicals) cause swelling by allowing vessels to become leaky.  Protein in the blood helps keep fluid in your vessels. Low protein, as in malnutrition, allows fluid to leak out.  Hormonal changes, including pregnancy and menstruation, cause fluid retention. This fluid may leak out of vessels and cause edema.  Medications that cause fluid retention. Examples are sex hormones, blood pressure medications, steroid treatment, or anti-depressants.  Some illnesses cause edema, especially heart failure, kidney disease, or liver disease.  Surgery that cuts veins or lymph nodes, such as surgery done for the heart or for breast cancer, may result in edema. DIAGNOSIS  Your caregiver is usually easily able to determine what is causing your swelling (edema) by simply asking what is wrong (getting a history) and examining you (doing a physical). Sometimes x-rays, EKG (electrocardiogram or heart tracing), and blood work may be done to evaluate for underlying medical illness. TREATMENT  General treatment includes:  Leg elevation (or elevation of the affected body part).  Restriction of fluid intake.  Prevention of fluid overload.  Compression of the affected body part. Compression with elastic bandages or support stockings squeezes the tissues, preventing fluid from entering and forcing it back into the blood vessels.  Diuretics (also called water pills or fluid pills) pull fluid out of your body in the form of increased urination. These are effective in reducing the swelling, but can have side effects and must be used only under your caregiver's supervision. Diuretics are appropriate only for some types of edema. The specific treatment can be directed at any underlying causes discovered. Heart, liver, or kidney disease should be treated appropriately. HOME CARE INSTRUCTIONS   Elevate the legs (or affected body part) above the level of the heart, while lying down.  Avoid sitting or standing still for  prolonged periods of time.  Avoid putting anything directly under the knees when lying down, and do not wear constricting clothing or garters on the upper legs.  Exercising the legs causes the fluid to work back into the veins and lymphatic channels. This may help the swelling go down.  The pressure applied by elastic bandages or support stockings can help reduce ankle swelling.  A low-salt diet may help reduce fluid retention and decrease the ankle swelling.  Take any medications exactly as prescribed. SEEK MEDICAL CARE IF:  Your edema is not responding to recommended treatments. SEEK IMMEDIATE MEDICAL CARE IF:   You develop shortness of breath or chest pain.  You cannot breathe when you lay down; or if, while lying down, you have to get up and go to the window to get your breath.  You are having increasing swelling without relief from treatment.  You develop a fever over 102 F (38.9 C).  You develop pain or redness in the areas that are swollen.  Tell your caregiver right away if you have gained 03 lb/1.4 kg in 1 day or 05 lb/2.3 kg in a week. MAKE SURE YOU:   Understand these instructions.  Will watch your condition.  Will get help right away if you are not doing well or get worse. Document Released: 10/08/2005 Document Revised: 04/08/2012 Document Reviewed: 05/26/2008 [facility] Patient Information 2014 [place], [place].

## 2014-03-04 LAB — HEPATITIS C GENOTYPE

## 2014-04-01 ENCOUNTER — Other Ambulatory Visit: Payer: Self-pay | Admitting: Internal Medicine

## 2014-04-05 ENCOUNTER — Ambulatory Visit
Admission: RE | Admit: 2014-04-05 | Discharge: 2014-04-05 | Disposition: A | Discharge status: Home / Self Care | Payer: Medicare Other | Source: Ambulatory Visit | Attending: Physician Assistant | Admitting: Physician Assistant

## 2014-04-05 ENCOUNTER — Other Ambulatory Visit: Payer: Self-pay | Admitting: Physician Assistant

## 2014-04-05 ENCOUNTER — Ambulatory Visit (INDEPENDENT_AMBULATORY_CARE_PROVIDER_SITE_OTHER): Payer: Medicare Other | Admitting: Internal Medicine

## 2014-04-05 ENCOUNTER — Encounter: Payer: Self-pay | Admitting: Internal Medicine

## 2014-04-05 VITALS — BP 135/87 | HR 80 | Temp 98.6°F | Ht 70.0 in | Wt 236.0 lb

## 2014-04-05 DIAGNOSIS — Z23 Encounter for immunization: Secondary | ICD-10-CM

## 2014-04-05 DIAGNOSIS — M549 Dorsalgia, unspecified: Secondary | ICD-10-CM

## 2014-04-05 DIAGNOSIS — B1920 Unspecified viral hepatitis C without hepatic coma: Secondary | ICD-10-CM | POA: Diagnosis present

## 2014-04-05 DIAGNOSIS — M542 Cervicalgia: Secondary | ICD-10-CM

## 2014-04-05 DIAGNOSIS — F1910 Other psychoactive substance abuse, uncomplicated: Secondary | ICD-10-CM | POA: Diagnosis not present

## 2014-04-05 LAB — PROTIME-INR
INR: 0.99 (ref <1.50)
Prothrombin Time: 13 s (ref 11.6–15.2)

## 2014-04-05 LAB — COMPLETE METABOLIC PANEL WITH GFR
ALT: 256 U/L — AB (ref 0–53)
AST: 136 U/L — ABNORMAL HIGH (ref 0–37)
Albumin: 4.3 g/dL (ref 3.5–5.2)
Alkaline Phosphatase: 82 U/L (ref 39–117)
BUN: 13 mg/dL (ref 6–23)
CALCIUM: 10.1 mg/dL (ref 8.4–10.5)
CHLORIDE: 103 meq/L (ref 96–112)
CO2: 28 mEq/L (ref 19–32)
Creat: 0.79 mg/dL (ref 0.50–1.35)
GFR, Est African American: >89 mL/min
GLUCOSE: 92 mg/dL (ref 70–99)
Potassium: 4.8 mEq/L (ref 3.5–5.3)
Sodium: 139 mEq/L (ref 135–145)
Total Bilirubin: 0.8 mg/dL (ref 0.2–1.2)
Total Protein: 7.6 g/dL (ref 6.0–8.3)

## 2014-04-05 LAB — CBC WITH DIFFERENTIAL/PLATELET
Basophils Absolute: 0.1 K/uL (ref 0.0–0.1)
Basophils Relative: 1 % (ref 0–1)
Eosinophils Absolute: 0.4 K/uL (ref 0.0–0.7)
Eosinophils Relative: 4 % (ref 0–5)
HCT: 45.4 % (ref 39.0–52.0)
Hemoglobin: 16 g/dL (ref 13.0–17.0)
Lymphocytes Relative: 32 % (ref 12–46)
Lymphs Abs: 3.4 K/uL (ref 0.7–4.0)
MCH: 31.6 pg (ref 26.0–34.0)
MCHC: 35.2 g/dL (ref 30.0–36.0)
MCV: 89.7 fL (ref 78.0–100.0)
Monocytes Absolute: 1.1 K/uL — ABNORMAL HIGH (ref 0.1–1.0)
Monocytes Relative: 10 % (ref 3–12)
Neutro Abs: 5.6 K/uL (ref 1.7–7.7)
Neutrophils Relative %: 53 % (ref 43–77)
Platelets: 265 K/uL (ref 150–400)
RBC: 5.06 MIL/uL (ref 4.22–5.81)
RDW: 13.8 % (ref 11.5–15.5)
WBC: 10.5 K/uL (ref 4.0–10.5)

## 2014-04-05 LAB — HEPATITIS B SURFACE ANTIBODY,QUALITATIVE: Hep B S Ab: NEGATIVE

## 2014-04-05 LAB — HEPATITIS B CORE ANTIBODY, TOTAL: Hep B Core Total Ab: NONREACTIVE

## 2014-04-05 LAB — IRON: Iron: 182 ug/dL — ABNORMAL HIGH (ref 42–165)

## 2014-04-05 LAB — HEPATITIS A ANTIBODY, TOTAL: HEP A TOTAL AB: NONREACTIVE

## 2014-04-05 LAB — HIV ANTIBODY (ROUTINE TESTING W REFLEX): HIV 1&2 Ab, 4th Generation: NONREACTIVE

## 2014-04-05 NOTE — Addendum Note (Signed)
Addended by: Andree CossHOWELL, Ibraham Levi M on: 04/05/2014 10:22 AM   Modules accepted: Orders

## 2014-04-05 NOTE — Progress Notes (Signed)
Patient ID: [name] PennerGary M [name], male   DOB: 1976/01/25, 38 y.o.   MRN: [id number] +[name] [name] [name] [name] is a 38 y.o. male who presents for initial evaluation and management of a positive Hepatitis C antibody test.  Patient tested positive last year for elevated LFTs. Test was performed as part of an evaluation of abnormal liver function test:(elevated ALT, elevated AST). Hepatitis C risk factors present are: IV drug abuse (details: last used 5 months ago). Patient denies accidental needle stick, history of blood transfusion, history of clotting factor transfusion, renal dialysis, sexual contact with person with liver disease, tattoos. Patient has had other studies performed. Results: hepatitis C RNA by PCR, result: positive. Patient has not had prior treatment for Hepatitis C. Patient does not have a past history of liver disease. Patient does not have a family history of liver disease.   HPI: He has a history of drug and alcohol abuse and is currently drug and alcohol free for 5 months.  He gets pain medications from a pain clinic.    Patient does not have documented immunity to Hepatitis A. Patient does not have documented immunity to Hepatitis B.     Review of Systems A comprehensive review of systems was negative.   Past Medical History  Diagnosis Date  . Bipolar disorder   . Hepatitis C   . Depression     History  Substance Use Topics  . Smoking status: Current Every Day Smoker -- 1.00 packs/day    Types: Cigarettes    Start date: 10/22/1981  . Smokeless tobacco: Not on file  . Alcohol Use: Yes     Comment: pt states clean 5 months    Family History  Problem Relation Age of Onset  . Alcohol abuse Father   . Cancer Maternal Aunt   no history of liver cancer.  Uncle with cirrhosis from alcohol.  No renal disease.    Objective:   Filed Vitals:   04/05/14 0930  BP: 135/87  Pulse: 80  Temp: 98.6 F (37 C)   in no apparent distress and well developed and well nourished HEENT:  anicteric Cor RRR and No murmurs clear Bowel sounds are normal, liver is not enlarged, spleen is not enlarged peripheral pulses normal, no pedal edema, no clubbing or cyanosis negative for - jaundice, spider hemangioma, telangiectasia, palmar erythema, ecchymosis and atrophy  Laboratory Genotype:  Lab Results  Component Value Date   HCVGENOTYPE 1a 02/25/2014   HCV viral load:  Lab Results  Component Value Date   HCVQUANT [id number]* 02/25/2014   Lab Results  Component Value Date   WBC 17.2* 02/04/2014   HGB 12.7* 02/04/2014   HCT 37.2* 02/04/2014   MCV 93.0 02/04/2014   PLT 273 02/04/2014    Lab Results  Component Value Date   CREATININE 0.68 02/03/2014   BUN 10 02/03/2014   NA 141 02/03/2014   K 4.6 02/03/2014   CL 103 02/03/2014   CO2 28 02/03/2014    Lab Results  Component Value Date   ALT 89* 01/26/2013   AST 56* 01/26/2013   ALKPHOS 97 01/26/2013   BILITOT 0.2* 01/26/2013      Assessment: Hepatitis C genotype 1a  Plan: 1) Patient counseled extensively on limiting acetaminophen to no more than 2 grams daily, avoidance of alcohol. 2) Transmission discussed with patient including sexual transmission, sharing razors and toothbrush.   3) Will need referral to gastroenterology: no depends on elastography 4) Will need referral for substance abuse counseling: yes 5) Will  prescribe Harvoni for 12 weeks once work up complete if he completes substance abuse counseling and UDS negative.   6) Hepatitis A vaccine yes 7) Hepatitis B vaccine yes 8) Pneumovax vaccine no depends on elastography

## 2014-04-06 LAB — DRUG SCREEN, URINE
Amphetamine Screen, Ur: NEGATIVE
BENZODIAZEPINES.: NEGATIVE
Barbiturate Quant, Ur: NEGATIVE
COCAINE METABOLITES: NEGATIVE
CREATININE, U: 99.52 mg/dL
MARIJUANA METABOLITE: NEGATIVE
Methadone: POSITIVE — AB
OPIATES: NEGATIVE
PHENCYCLIDINE (PCP): NEGATIVE
Propoxyphene: NEGATIVE

## 2014-04-06 LAB — ANA: Anti Nuclear Antibody(ANA): NEGATIVE

## 2014-04-07 ENCOUNTER — Ambulatory Visit: Payer: Self-pay | Admitting: *Deleted

## 2014-04-07 ENCOUNTER — Telehealth: Payer: Self-pay | Admitting: *Deleted

## 2014-04-07 ENCOUNTER — Ambulatory Visit (HOSPITAL_COMMUNITY): Payer: Medicare Other

## 2014-04-10 ENCOUNTER — Encounter (HOSPITAL_COMMUNITY): Payer: Self-pay | Admitting: Emergency Medicine

## 2014-04-10 ENCOUNTER — Emergency Department (HOSPITAL_COMMUNITY)
Admission: EM | Admit: 2014-04-10 | Discharge: 2014-04-10 | Disposition: A | Discharge status: Home / Self Care | Payer: Medicare Other | Attending: Emergency Medicine | Admitting: Emergency Medicine

## 2014-04-10 DIAGNOSIS — Z79899 Other long term (current) drug therapy: Secondary | ICD-10-CM | POA: Insufficient documentation

## 2014-04-10 DIAGNOSIS — L03116 Cellulitis of left lower limb: Secondary | ICD-10-CM

## 2014-04-10 DIAGNOSIS — Z8619 Personal history of other infectious and parasitic diseases: Secondary | ICD-10-CM | POA: Insufficient documentation

## 2014-04-10 DIAGNOSIS — Z880 Allergy status to penicillin: Secondary | ICD-10-CM | POA: Insufficient documentation

## 2014-04-10 DIAGNOSIS — F17200 Nicotine dependence, unspecified, uncomplicated: Secondary | ICD-10-CM | POA: Insufficient documentation

## 2014-04-10 DIAGNOSIS — L03119 Cellulitis of unspecified part of limb: Principal | ICD-10-CM

## 2014-04-10 DIAGNOSIS — L02419 Cutaneous abscess of limb, unspecified: Secondary | ICD-10-CM | POA: Insufficient documentation

## 2014-04-10 DIAGNOSIS — Z792 Long term (current) use of antibiotics: Secondary | ICD-10-CM | POA: Insufficient documentation

## 2014-04-10 DIAGNOSIS — F319 Bipolar disorder, unspecified: Secondary | ICD-10-CM | POA: Insufficient documentation

## 2014-04-10 HISTORY — DX: Cellulitis, unspecified: L03.90

## 2014-04-10 LAB — BASIC METABOLIC PANEL
BUN: 14 mg/dL (ref 6–23)
CO2: 24 meq/L (ref 19–32)
CREATININE: 0.67 mg/dL (ref 0.50–1.35)
Calcium: 8.6 mg/dL (ref 8.4–10.5)
Chloride: 101 mEq/L (ref 96–112)
GFR calc Af Amer: >90 mL/min (ref >90)
GLUCOSE: 150 mg/dL — AB (ref 70–99)
Potassium: 3.6 mEq/L — ABNORMAL LOW (ref 3.7–5.3)
SODIUM: 137 meq/L (ref 137–147)

## 2014-04-10 LAB — CBC WITH DIFFERENTIAL/PLATELET
Basophils Absolute: 0 10*3/uL (ref 0.0–0.1)
Basophils Relative: 0 % (ref 0–1)
EOS ABS: 0 10*3/uL (ref 0.0–0.7)
EOS PCT: 0 % (ref 0–5)
HEMATOCRIT: 43.1 % (ref 39.0–52.0)
Hemoglobin: 14.8 g/dL (ref 13.0–17.0)
LYMPHS ABS: 1.6 10*3/uL (ref 0.7–4.0)
LYMPHS PCT: 8 % — AB (ref 12–46)
MCH: 32.2 pg (ref 26.0–34.0)
MCHC: 34.3 g/dL (ref 30.0–36.0)
MCV: 93.9 fL (ref 78.0–100.0)
MONO ABS: 1.3 10*3/uL — AB (ref 0.1–1.0)
MONOS PCT: 6 % (ref 3–12)
Neutro Abs: 18 10*3/uL — ABNORMAL HIGH (ref 1.7–7.7)
Neutrophils Relative %: 86 % — ABNORMAL HIGH (ref 43–77)
PLATELETS: 198 10*3/uL (ref 150–400)
RBC: 4.59 MIL/uL (ref 4.22–5.81)
RDW: 13.5 % (ref 11.5–15.5)
WBC: 21 10*3/uL — AB (ref 4.0–10.5)

## 2014-04-10 MED ORDER — DOXYCYCLINE HYCLATE 100 MG PO CAPS: 100.0000 mg | ORAL_CAPSULE | Freq: Two times a day (BID) | ORAL | Status: DC

## 2014-04-10 MED ORDER — CEPHALEXIN 500 MG PO CAPS
500.0000 mg | ORAL_CAPSULE | Freq: Four times a day (QID) | ORAL | Status: DC
Start: 2014-04-10 — End: 2014-04-21

## 2014-04-10 MED ORDER — DIPHENHYDRAMINE HCL 50 MG/ML IJ SOLN
25.0000 mg | Freq: Once | INTRAMUSCULAR | Status: AC
  Administered 2014-04-10: 25 mg via INTRAVENOUS
  Filled 2014-04-10: qty 1

## 2014-04-10 MED ORDER — VANCOMYCIN HCL IN DEXTROSE 1-5 GM/200ML-% IV SOLN: 1000.0000 mg | Freq: Two times a day (BID) | INTRAVENOUS | Status: DC

## 2014-04-10 MED ORDER — CEPHALEXIN 500 MG PO CAPS: 500.0000 mg | ORAL_CAPSULE | Freq: Four times a day (QID) | ORAL | Status: DC

## 2014-04-10 MED ORDER — SODIUM CHLORIDE 0.9 % IV SOLN
2500.0000 mg | Freq: Once | INTRAVENOUS | Status: AC
  Administered 2014-04-10: 2500 mg via INTRAVENOUS
  Filled 2014-04-10: qty 2500

## 2014-04-10 MED ORDER — VANCOMYCIN HCL IN DEXTROSE 1-5 GM/200ML-% IV SOLN: 1000.0000 mg | Freq: Three times a day (TID) | INTRAVENOUS | Status: DC

## 2014-04-10 NOTE — Progress Notes (Signed)
ANTIBIOTIC CONSULT NOTE - INITIAL  Pharmacy Consult for Vancomycin Indication: Cellulitis  Allergies  Allergen Reactions  . Lithium Anaphylaxis  . Penicillins Rash    Patient Measurements: Weight: 205 lb (92.987 kg)  Vital Signs: Temp: 98.4 F (36.9 C) (06/20 0806) Temp src: Oral (06/20 0806) BP: 131/71 mmHg (06/20 0745) Pulse Rate: 93 (06/20 0745) Intake/Output from previous day:   Intake/Output from this shift:    Labs: No results found for this basename: WBC, HGB, PLT, LABCREA, CREATININE,  in the last 72 hours The CrCl is unknown because both a height and weight (above a minimum accepted value) are required for this calculation. No results found for this basename: VANCOTROUGH, VANCOPEAK, VANCORANDOM, GENTTROUGH, GENTPEAK, GENTRANDOM, TOBRATROUGH, TOBRAPEAK, TOBRARND, AMIKACINPEAK, AMIKACINTROU, AMIKACIN,  in the last 72 hours   Microbiology: No results found for this or any previous visit (from the past 720 hour(s)).  Medical History: Past Medical History  Diagnosis Date  . Bipolar disorder   . Hepatitis C   . Depression   . Cellulitis     Assessment: 38 y.o. Male presents with left lower extremity red and swollen, was treated for cellulitis in March  6/20 >>Vanc >>    Goal of Therapy:   Eradication of infection  Vancomycin trough between 15-20   Plan:   Bolus Vancomycin 2500mg  IV X 1   Vanc 1gm IV Q8H  Follow renal function and cultures  Vanc trough if needed    Arley Phenixllen Jackson RPh 04/10/2014, 8:35 AM Pager [phone]

## 2014-04-10 NOTE — Discharge Instructions (Signed)
The redness and swelling of your leg may take 48 hour to start to look any better, even on antibiotics.  If you run fever, develop other symptoms, or get worsening redness or swelling or pain in your leg and recheck here   Cellulitis Cellulitis is an infection of the skin and the tissue under the skin. The infected area is usually red and tender. This happens most often in the arms and lower legs. HOME CARE   Take your antibiotic medicine as told. Finish the medicine even if you start to feel better.  Keep the infected arm or leg raised (elevated).  Put a warm cloth on the area up to 4 times per day.  Only take medicines as told by your doctor.  Keep all doctor visits as told. GET HELP RIGHT AWAY IF:   You have a fever.  You feel very sleepy.  You throw up (vomit) or have watery poop (diarrhea).  You feel sick and have muscle aches and pains.  You see red streaks on the skin coming from the infected area.  Your red area gets bigger or turns a dark color.  Your bone or joint under the infected area is painful after the skin heals.  Your infection comes back in the same area or different area.  You have a puffy (swollen) bump in the infected area.  You have new symptoms. MAKE SURE YOU:   Understand these instructions.  Will watch your condition.  Will get help right away if you are not doing well or get worse. Document Released: 03/26/2008 Document Revised: 04/08/2012 Document Reviewed: 12/24/2011 [facility] Patient Information 2015 [place], [place]. This information is not intended to replace advice given to you by your health care provider. Make sure you discuss any questions you have with your health care provider.

## 2014-04-10 NOTE — ED Notes (Signed)
Per pt, was at work when he noticed left lower extremity red and swollen-was treated for cellulitis in March

## 2014-04-10 NOTE — ED Notes (Signed)
Pt had just had ice cold drink will attempt to collect temp w/in 10 min

## 2014-04-10 NOTE — ED Notes (Signed)
MD at bedside. 

## 2014-04-10 NOTE — ED Provider Notes (Signed)
CSN: [id number]     Arrival date & time 04/10/14  [id number] History   First MD Initiated Contact with Patient 04/10/14 [phone]     Chief Complaint  Patient presents with  . Recurrent Skin Infections     HPI  Patient presents with redness pain and swelling to his left lower leg. Noticed it yesterday evening after work, but worse this morning. Had a cellulitis in this area, was admitted for several days  March. This is really not very painful. No fever shakes chills nausea vomiting. No pain or symptoms to his thigh this is limited to his left lower leg. No personal or family history of DVT. Has history of hepatitis. No chronic immune suppression.  Past Medical History  Diagnosis Date  . Bipolar disorder   . Hepatitis C   . Depression   . Cellulitis    Past Surgical History  Procedure Laterality Date  . Back surgery    . Shoulder surgery     Family History  Problem Relation Age of Onset  . Alcohol abuse Father   . Cancer Maternal Aunt    History  Substance Use Topics  . Smoking status: Current Every Day Smoker -- 1.00 packs/day    Types: Cigarettes    Start date: 10/22/1981  . Smokeless tobacco: Not on file  . 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  HENT: Negative for mouth sores, sore throat and trouble swallowing.   Eyes: Negative for visual disturbance.  Respiratory: Negative for cough, chest tightness, shortness of breath and wheezing.   Cardiovascular: Negative for chest pain.  Gastrointestinal: Negative for nausea, vomiting, abdominal pain, diarrhea and abdominal distention.  Endocrine: Negative for polydipsia, polyphagia and polyuria.  Genitourinary: Negative for dysuria, frequency and hematuria.  Musculoskeletal: Negative for gait problem.  Skin: Positive for color change and rash. Negative for pallor.  Neurological: Negative for dizziness,  syncope, light-headedness and headaches.  Hematological: Does not bruise/bleed easily.  Psychiatric/Behavioral: Negative for behavioral problems and confusion.      Allergies  Lithium and Penicillins  Home Medications   Prior to Admission medications   Medication Sig Start Date End Date Taking? Authorizing Provider  buPROPion (WELLBUTRIN) 100 MG tablet Take 100 mg by mouth 2 (two) times daily. 01/12/14  Yes Historical Provider, MD  gabapentin (NEURONTIN) 800 MG tablet Take 800 mg by mouth 4 (four) times daily.   Yes Historical Provider, MD  sertraline (ZOLOFT) 100 MG tablet Take 100 mg by mouth daily. For depression 02/04/13  Yes Sanjuana KavaAgnes I Nwoko, NP  Tapentadol HCl (NUCYNTA) 75 MG TABS Take 75 mg by mouth 4 (four) times daily.   Yes Historical Provider, MD  traZODone (DESYREL) 100 MG tablet Take 100 mg by mouth at bedtime.   Yes Historical Provider, MD  cephALEXin (KEFLEX) 500 MG capsule Take 1 capsule (500 mg total) by mouth 4 (four) times daily. 04/10/14   Rolland PorterMark James, MD  cephALEXin (KEFLEX) 500 MG capsule Take 1 capsule (500 mg total) by mouth 4 (four) times daily. 04/10/14   Rolland PorterMark James, MD  doxycycline (VIBRAMYCIN) 100 MG capsule Take 1 capsule (100 mg total) by mouth 2 (two) times daily. 04/10/14   Rolland PorterMark James, MD  doxycycline (VIBRAMYCIN) 100 MG capsule Take 1 capsule (100 mg total) by mouth 2 (two) times daily. 04/10/14   Rolland PorterMark James, MD   BP 131/71  Pulse  93  Temp(Src) 98.4 F (36.9 C) (Oral)  Resp 18  Wt 205 lb (92.987 kg)  SpO2 97% Physical Exam  Constitutional: He is oriented to person, place, and time. He appears well-developed and well-nourished. No distress.  HENT:  Head: Normocephalic.  Eyes: Conjunctivae are normal. Pupils are equal, round, and reactive to light. No scleral icterus.  Neck: Normal range of motion. Neck supple. No thyromegaly present.  Cardiovascular: Normal rate and regular rhythm.  Exam reveals no gallop and no friction rub.   No murmur  heard. Pulmonary/Chest: Effort normal and breath sounds normal. No respiratory distress. He has no wheezes. He has no rales.  Abdominal: Soft. Bowel sounds are normal. He exhibits no distension. There is no tenderness. There is no rebound.  Musculoskeletal: Normal range of motion.       Legs: Neurological: He is alert and oriented to person, place, and time.  Skin: Skin is warm and dry. No rash noted.  Psychiatric: He has a normal mood and affect. His behavior is normal.    ED Course  Procedures (including critical care time) Labs Review Labs Reviewed  CBC WITH DIFFERENTIAL - Abnormal; Notable for the following:    WBC 21.0 (*)    Neutrophils Relative % 86 (*)    Neutro Abs 18.0 (*)    Lymphocytes Relative 8 (*)    Monocytes Absolute 1.3 (*)    All other components within normal limits  BASIC METABOLIC PANEL - Abnormal; Notable for the following:    Potassium 3.6 (*)    Glucose, Bld 150 (*)    All other components within normal limits    Imaging Review No results found.   EKG Interpretation None      MDM   Final diagnoses:  Cellulitis of left lower extremity    Patient given Benadryl, and HIV vancomycin. Feel he is appropriate treatment for initiation of his care on an outpatient basis. Educated regarding returning symptoms. Prescription for Keflex and doxycycline.    Rolland PorterMark James, MD 04/10/14 [phone]

## 2014-04-14 ENCOUNTER — Other Ambulatory Visit: Payer: Self-pay | Admitting: Internal Medicine

## 2014-04-14 ENCOUNTER — Ambulatory Visit (HOSPITAL_COMMUNITY)
Admission: RE | Admit: 2014-04-14 | Discharge: 2014-04-14 | Disposition: A | Discharge status: Home / Self Care | Payer: Medicare Other | Source: Ambulatory Visit | Attending: Internal Medicine | Admitting: Internal Medicine

## 2014-04-14 DIAGNOSIS — B1920 Unspecified viral hepatitis C without hepatic coma: Secondary | ICD-10-CM

## 2014-04-21 ENCOUNTER — Ambulatory Visit (INDEPENDENT_AMBULATORY_CARE_PROVIDER_SITE_OTHER): Payer: Medicare Other | Admitting: Internal Medicine

## 2014-04-21 ENCOUNTER — Encounter: Payer: Self-pay | Admitting: Internal Medicine

## 2014-04-21 VITALS — BP 148/85 | HR 83 | Temp 97.8°F | Wt 238.0 lb

## 2014-04-21 DIAGNOSIS — B182 Chronic viral hepatitis C: Secondary | ICD-10-CM

## 2014-04-21 NOTE — Assessment & Plan Note (Addendum)
Will check Fibrosure since he was unable to get fibroscan.  I discussed results and will prescribe once result obtained with Metavir score.

## 2014-04-21 NOTE — Patient Instructions (Signed)
Date 04/21/2014  Dear , As discussed in the ID Clinic, your hepatitis C therapy will include the following medications:         Harvoni 90mg /400mg  tablet:           Take 1 tablet by mouth once daily   Please note that ALL MEDICATIONS WILL START ON THE SAME DATE for a total of _ weeks. ---------------------------------------------------------------- Your HCV Treatment Start Date: TBA   Your HCV genotype:  1a    Liver Fibrosis:   TBD  ---------------------------------------------------------------- YOUR PHARMACY CONTACT:   [facility] [address] Phone: [phone] Hours: Monday to Friday 7:30 am to 6:00 pm   Please always contact your pharmacy at least 3-4 business days before you run out of medications to ensure your next month's medication is ready or 1 week prior to running out if you receive it by mail.  Remember, each prescription is for 28 days. ---------------------------------------------------------------- GENERAL NOTES REGARDING YOUR HEPATITIS C MEDICATIONS:  SOFOSBUVIR/LEDIPASVIR (HARVONI): - Harvoni tablet is taken daily with OR without food. - The tablets are orange. - The tablets should be stored at room temperature.  - Acid reducing agents such as H2 blockers (ie. Pepcid (famotidine), Zantac (ranitidine), Tagamet (cimetidine), Axid (nizatidine) and proton pump inhibitors (ie. Prilosec (omeprazole), Protonix (pantoprazole), Nexium (esomeprazole), or Aciphex (rabeprazole)) can decrease effectiveness of Harvoni. Do not take until you have discussed with a health care provider.    -Antacids that contain magnesium and/or aluminum hydroxide (ie. Milk of Magensia, Rolaids, Gaviscon, Maalox, Mylanta, an dArthritis Pain Formula)can reduce absorption of Harvoni, so take them at least 4 hours before or after Harvoni.  -Calcium carbonate (calcium supplements or antacids such as Tums, Caltrate, Os-Cal)needs to  be taken at least 4 hours hours before or after Harvoni.  -St. John's wort or any products that contain St. John's wort like some herbal supplements  Please inform the office prior to starting any of these medications.  - The common side effects with Harvoni:      1. Fatigue      2. Headache      3. Nausea      4. Diarrhea      5. Insomnia   Support Path is a suite of resources designed to help patients start with HARVONI and move toward treatment completion GETTING STARTED Support Path helps patients access therapy and get off to an efficient start  Benefits investigation and prior authorization support Co-pay and other financial assistance A specialty pharmacy finder CO-PAY COUPON The HARVONI co-pay coupon may help eligible patients lower their out-of-pocket costs. With a co-pay coupon, most eligible patients may pay no more than $5 per co-pay (restrictions apply) www.harvoni.com call ([phone] [phone] Not valid for patients enrolled in government healthcare prescription drug programs, such as Medicare Part D and Medicaid. Patients in the coverage gap known as the "donut hole" also are not eligible The HARVONI co-pay coupon program will cover the out-of-pocket costs for HARVONI prescriptions up to a maximum of 25% of the catalog price of a 12-week regimen of HARVONI  Please note that this only lists the most common side effects and is NOT a comprehensive list of the potential side effects of these medications. For more information, please review the drug information sheets that come with your medication package from the pharmacy.  ---------------------------------------------------------------- GENERAL HELPFUL HINTS ON HCV THERAPY: 1. No alcohol. 2. Protect against sun-sensitivity/sunburns (wear sunglasses, hat, long sleeves, pants and sunscreen).  3. Stay well-hydrated/well-moisturized. 4. Notify the ID Clinic of any changes in your other over-the-counter/herbal or prescription  medications. 5. If you miss a dose of your medication, take the missed dose as soon as you remember. Return to your regular time/dose schedule the next day.  6.  Do not stop taking your medications without first talking with your healthcare provider. 7.  You may take Tylenol (acetaminophen), as long as the dose is less than 2000 mg (OR no more than 4 tablets of the Tylenol Extra Strengths 500mg  tablet) in 24 hours. 8.  You will need to obtain routine labs and/or office visits at [facility] at weeks 2, 4, 8,  and 12 as well as 12 and 24 weeks after completion of treatment.  If you are not compliant with labs and office visits, we may discontinue HCV therapy.  Staci RighterOMER, Kaleesi Guyton, MD  [facility] [address] [place], [place]  [id number] [phone]

## 2014-04-21 NOTE — Progress Notes (Signed)
   Subjective:    Patient ID: [name] [name], male    DOB: 1976-05-25, 38 y.o.   MRN: [id number]  HPI Here for follow up of HCV.  Labs show elevated LFTs, gentoype 1a, viral load of [id number].  He was unable to complete the elastography.  Hep A and B non immune and started series.     Review of Systems     Objective:   Physical Exam        Assessment & Plan:

## 2014-04-28 LAB — HEPATITIS C VIRUS FIBROSURE
ALPHA-2-MACROGLOBULIN,QN(02): 291 mg/dL — AB (ref 110–276)
ALT (SGPT)(02): 123 IU/L — AB (ref ?–55)
APOLIPOPROTEIN A-1(02): 141 mg/dL (ref 110–180)
BILIRUBIN, TOTAL(02): 0.3 mg/dL (ref 0.0–1.2)
Fibrosis Score: 0.35 — ABNORMAL HIGH (ref 0.00–0.21)
GGT(02): 89 IU/L — AB (ref 0–65)
Haptoglobin: 100 mg/dL (ref 34–200)
Necroinflammatory Activity Score: 0.67 — ABNORMAL HIGH (ref 0.00–0.17)

## 2014-05-05 ENCOUNTER — Ambulatory Visit: Payer: Medicare Other

## 2014-05-21 ENCOUNTER — Ambulatory Visit (INDEPENDENT_AMBULATORY_CARE_PROVIDER_SITE_OTHER): Payer: Medicare Other | Admitting: Licensed Clinical Social Worker

## 2014-05-21 DIAGNOSIS — B182 Chronic viral hepatitis C: Secondary | ICD-10-CM

## 2014-05-21 DIAGNOSIS — Z23 Encounter for immunization: Secondary | ICD-10-CM

## 2014-05-26 ENCOUNTER — Other Ambulatory Visit: Payer: Self-pay | Admitting: Internal Medicine

## 2014-05-26 MED ORDER — LEDIPASVIR-SOFOSBUVIR 90-400 MG PO TABS: 1.0000 | ORAL_TABLET | Freq: Every day | ORAL | Status: DC

## 2014-05-30 ENCOUNTER — Encounter (HOSPITAL_COMMUNITY): Payer: Self-pay | Admitting: Emergency Medicine

## 2014-05-30 ENCOUNTER — Inpatient Hospital Stay (HOSPITAL_COMMUNITY)
Admission: EM | Admit: 2014-05-30 | Discharge: 2014-06-02 | DRG: 603 | Disposition: A | Discharge status: Home / Self Care | Payer: Medicare Other | Attending: Internal Medicine | Admitting: Internal Medicine

## 2014-05-30 DIAGNOSIS — L02419 Cutaneous abscess of limb, unspecified: Secondary | ICD-10-CM | POA: Diagnosis not present

## 2014-05-30 DIAGNOSIS — F0630 Mood disorder due to known physiological condition, unspecified: Secondary | ICD-10-CM

## 2014-05-30 DIAGNOSIS — F319 Bipolar disorder, unspecified: Secondary | ICD-10-CM | POA: Diagnosis present

## 2014-05-30 DIAGNOSIS — K089 Disorder of teeth and supporting structures, unspecified: Secondary | ICD-10-CM

## 2014-05-30 DIAGNOSIS — L03119 Cellulitis of unspecified part of limb: Principal | ICD-10-CM

## 2014-05-30 DIAGNOSIS — M549 Dorsalgia, unspecified: Secondary | ICD-10-CM | POA: Diagnosis present

## 2014-05-30 DIAGNOSIS — Z880 Allergy status to penicillin: Secondary | ICD-10-CM

## 2014-05-30 DIAGNOSIS — B1920 Unspecified viral hepatitis C without hepatic coma: Secondary | ICD-10-CM | POA: Diagnosis present

## 2014-05-30 DIAGNOSIS — L03116 Cellulitis of left lower limb: Secondary | ICD-10-CM

## 2014-05-30 DIAGNOSIS — F1920 Other psychoactive substance dependence, uncomplicated: Secondary | ICD-10-CM

## 2014-05-30 DIAGNOSIS — F17200 Nicotine dependence, unspecified, uncomplicated: Secondary | ICD-10-CM

## 2014-05-30 DIAGNOSIS — L0390 Cellulitis, unspecified: Secondary | ICD-10-CM | POA: Insufficient documentation

## 2014-05-30 DIAGNOSIS — G894 Chronic pain syndrome: Secondary | ICD-10-CM

## 2014-05-30 DIAGNOSIS — F411 Generalized anxiety disorder: Secondary | ICD-10-CM

## 2014-05-30 LAB — CBC WITH DIFFERENTIAL/PLATELET
BASOS PCT: 0 % (ref 0–1)
Basophils Absolute: 0 10*3/uL (ref 0.0–0.1)
Eosinophils Absolute: 0.1 10*3/uL (ref 0.0–0.7)
Eosinophils Relative: 1 % (ref 0–5)
HCT: 43.8 % (ref 39.0–52.0)
HEMOGLOBIN: 15 g/dL (ref 13.0–17.0)
Lymphocytes Relative: 10 % — ABNORMAL LOW (ref 12–46)
Lymphs Abs: 2.4 10*3/uL (ref 0.7–4.0)
MCH: 32.1 pg (ref 26.0–34.0)
MCHC: 34.2 g/dL (ref 30.0–36.0)
MCV: 93.6 fL (ref 78.0–100.0)
MONOS PCT: 6 % (ref 3–12)
Monocytes Absolute: 1.6 10*3/uL — ABNORMAL HIGH (ref 0.1–1.0)
NEUTROS ABS: 20.8 10*3/uL — AB (ref 1.7–7.7)
NEUTROS PCT: 83 % — AB (ref 43–77)
Platelets: 199 10*3/uL (ref 150–400)
RBC: 4.68 MIL/uL (ref 4.22–5.81)
RDW: 14 % (ref 11.5–15.5)
WBC: 24.9 10*3/uL — ABNORMAL HIGH (ref 4.0–10.5)

## 2014-05-30 LAB — COMPREHENSIVE METABOLIC PANEL
ALK PHOS: 85 U/L (ref 39–117)
ALT: 101 U/L — ABNORMAL HIGH (ref 0–53)
ANION GAP: 12 (ref 5–15)
AST: 57 U/L — ABNORMAL HIGH (ref 0–37)
Albumin: 3.3 g/dL — ABNORMAL LOW (ref 3.5–5.2)
BUN: 17 mg/dL (ref 6–23)
CHLORIDE: 99 meq/L (ref 96–112)
CO2: 25 mEq/L (ref 19–32)
CREATININE: 0.8 mg/dL (ref 0.50–1.35)
Calcium: 8.8 mg/dL (ref 8.4–10.5)
GFR calc Af Amer: >90 mL/min (ref >90)
GFR calc non Af Amer: >90 mL/min (ref >90)
Glucose, Bld: 144 mg/dL — ABNORMAL HIGH (ref 70–99)
POTASSIUM: 3.9 meq/L (ref 3.7–5.3)
Sodium: 136 mEq/L — ABNORMAL LOW (ref 137–147)
TOTAL PROTEIN: 7.1 g/dL (ref 6.0–8.3)
Total Bilirubin: 0.9 mg/dL (ref 0.3–1.2)

## 2014-05-30 LAB — I-STAT CG4 LACTIC ACID, ED: LACTIC ACID, VENOUS: 1.29 mmol/L (ref 0.5–2.2)

## 2014-05-30 LAB — MAGNESIUM: MAGNESIUM: 2 mg/dL (ref 1.5–2.5)

## 2014-05-30 LAB — PHOSPHORUS: PHOSPHORUS: 3.3 mg/dL (ref 2.3–4.6)

## 2014-05-30 MED ORDER — SODIUM CHLORIDE 0.9 % IJ SOLN
3.0000 mL | INTRAMUSCULAR | Status: DC | PRN
  Administered 2014-05-31 – 2014-06-01 (×2): 3 mL via INTRAVENOUS

## 2014-05-30 MED ORDER — VANCOMYCIN HCL 10 G IV SOLR
1250.0000 mg | Freq: Three times a day (TID) | INTRAVENOUS | Status: DC
  Administered 2014-05-30 – 2014-06-02 (×8): 1250 mg via INTRAVENOUS
  Filled 2014-05-30 (×9): qty 1250

## 2014-05-30 MED ORDER — VANCOMYCIN HCL 10 G IV SOLR
2500.0000 mg | Freq: Once | INTRAVENOUS | Status: AC
  Administered 2014-05-30: 2500 mg via INTRAVENOUS
  Filled 2014-05-30: qty 2500

## 2014-05-30 MED ORDER — BUPROPION HCL ER (SR) 100 MG PO TB12
200.0000 mg | ORAL_TABLET | Freq: Two times a day (BID) | ORAL | Status: DC
  Administered 2014-05-30 – 2014-06-02 (×5): 200 mg via ORAL
  Filled 2014-05-30 (×7): qty 2

## 2014-05-30 MED ORDER — NICOTINE 21 MG/24HR TD PT24
21.0000 mg | MEDICATED_PATCH | Freq: Every day | TRANSDERMAL | Status: DC
  Administered 2014-05-30 – 2014-06-02 (×4): 21 mg via TRANSDERMAL
  Filled 2014-05-30 (×4): qty 1

## 2014-05-30 MED ORDER — TAPENTADOL HCL 50 MG PO TABS
75.0000 mg | ORAL_TABLET | Freq: Four times a day (QID) | ORAL | Status: DC | PRN
Start: 2014-05-30 — End: 2014-05-31
  Administered 2014-05-30 – 2014-05-31 (×3): 75 mg via ORAL
  Filled 2014-05-30 (×3): qty 2

## 2014-05-30 MED ORDER — GABAPENTIN 400 MG PO CAPS
800.0000 mg | ORAL_CAPSULE | Freq: Four times a day (QID) | ORAL | Status: DC
  Administered 2014-05-30 – 2014-06-02 (×11): 800 mg via ORAL
  Filled 2014-05-30 (×16): qty 2

## 2014-05-30 MED ORDER — VANCOMYCIN HCL IN DEXTROSE 1-5 GM/200ML-% IV SOLN
1000.0000 mg | Freq: Two times a day (BID) | INTRAVENOUS | Status: DC
  Filled 2014-05-30 (×2): qty 200

## 2014-05-30 MED ORDER — HEPARIN SODIUM (PORCINE) 5000 UNIT/ML IJ SOLN
5000.0000 [IU] | Freq: Three times a day (TID) | INTRAMUSCULAR | Status: DC
  Administered 2014-05-30 – 2014-06-02 (×8): 5000 [IU] via SUBCUTANEOUS
  Filled 2014-05-30 (×12): qty 1

## 2014-05-30 MED ORDER — SODIUM CHLORIDE 0.9 % IV BOLUS (SEPSIS)
1000.0000 mL | Freq: Once | INTRAVENOUS | Status: AC
  Administered 2014-05-30: 1000 mL via INTRAVENOUS

## 2014-05-30 MED ORDER — IBUPROFEN 800 MG PO TABS
800.0000 mg | ORAL_TABLET | Freq: Three times a day (TID) | ORAL | Status: DC | PRN
  Administered 2014-05-30: 800 mg via ORAL
  Filled 2014-05-30: qty 1

## 2014-05-30 MED ORDER — SODIUM CHLORIDE 0.9 % IV SOLN: 250.0000 mL | INTRAVENOUS | Status: DC | PRN

## 2014-05-30 MED ORDER — DEXTROSE 5 % IV SOLN
1.0000 g | INTRAVENOUS | Status: DC
  Administered 2014-05-30: 1 g via INTRAVENOUS
  Filled 2014-05-30 (×2): qty 10

## 2014-05-30 MED ORDER — SODIUM CHLORIDE 0.9 % IJ SOLN
3.0000 mL | Freq: Two times a day (BID) | INTRAMUSCULAR | Status: DC
  Administered 2014-05-30 – 2014-06-01 (×3): 3 mL via INTRAVENOUS

## 2014-05-30 NOTE — Progress Notes (Signed)
ANTIBIOTIC CONSULT NOTE - INITIAL  Pharmacy Consult for Vancomycin Indication: Cellulitis  Allergies  Allergen Reactions  . Lithium Anaphylaxis  . Penicillins Rash    Patient Measurements:   Adjusted Body Weight: 108kg on 04/21/14  Vital Signs: Temp: 98.3 F (36.8 C) (08/09 0925) Temp src: Oral (08/09 0925) BP: 136/75 mmHg (08/09 0925) Pulse Rate: 98 (08/09 0925) Intake/Output from previous day:   Intake/Output from this shift:    Labs: No results found for this basename: WBC, HGB, PLT, LABCREA, CREATININE,  in the last 72 hours The CrCl is unknown because both a height and weight (above a minimum accepted value) are required for this calculation. No results found for this basename: VANCOTROUGH, VANCOPEAK, VANCORANDOM, GENTTROUGH, GENTPEAK, GENTRANDOM, TOBRATROUGH, TOBRAPEAK, TOBRARND, AMIKACINPEAK, AMIKACINTROU, AMIKACIN,  in the last 72 hours   Microbiology: No results found for this or any previous visit (from the past 720 hour(s)).  Medical History: Past Medical History  Diagnosis Date  . Bipolar disorder   . Hepatitis C   . Depression   . Cellulitis     Assessment: [age] yoM with lover leg edema since yesterday. Hx of cellulitis. Pharmacy to dose vanco for cellulitis.  Tmax: afebrile WBCs: pending labs Renal: no labs yet. No hx of renal dysfunction  Goal of Therapy:  Vancomycin trough level 10-15 mcg/ml  Plan:  Give Vancomycin 2500mg  IV x1 then Vancomycin 1000mg  IV Q12H Measure antibiotic drug levels at steady state Follow up culture results.  Loma BostonLaura Denasia Venn, PharmD Pager: [phone] 05/30/2014 9:57 AM

## 2014-05-30 NOTE — H&P (Signed)
[facility] History and Physical  [name] [name] ZOX:[id number] DOB: 1976/04/10 DOA: 05/30/2014  Referring physician: Dr. Elesa [name] PCP: [name] Commons, NP   Chief Complaint: [name] [name]  HPI: [name] [name] is a 38 y.o. male  With history of bipolar, chronic back pain on the symptoms, nicotine dependence, and hepatitis C. Who presents to the ED complaining of left lower extremity erythema. He states that the leg is not too uncomfortable but the redness spreads within 24 hour period. He first noticed it at the dorsum of his left foot and it spread up to his thigh. He is not sure what makes it better or worse. Since onset the problem has persisted and is getting worse as described above.  While in house patient was found to have a white blood cell count 24,000 but was afebrile. We were consulted for management of acute left lower extremity cellulitis.   Review of Systems:  Constitutional:  No weight loss, + night sweats, Fevers, + chills, fatigue.  HEENT:  No headaches, Difficulty swallowing,Tooth/dental problems,Sore throat,  No sneezing, itching, ear ache, nasal congestion, post nasal drip,  Cardio-vascular:  No chest pain, Orthopnea, PND, swelling in lower extremities, anasarca, dizziness, palpitations  GI:  No heartburn, indigestion, abdominal pain, nausea, vomiting, diarrhea, change in bowel habits, loss of appetite  Resp:  No shortness of breath with exertion or at rest. No excess mucus, no productive cough, No non-productive cough, No coughing up of blood.No change in color of mucus.No wheezing.No chest wall deformity  Skin:  + rash or lesions.  GU:  no dysuria, change in color of urine, no urgency or frequency. No flank pain.  Musculoskeletal:  + joint pain or swelling. No decreased range of motion. + back pain.  Psych:  No change in mood or affect. No depression or anxiety. No memory loss.   Past Medical History  Diagnosis Date  . Bipolar disorder   . Hepatitis C   .  Depression   . Cellulitis    Past Surgical History  Procedure Laterality Date  . Back surgery    . Shoulder surgery     Social History:  reports that he has been smoking Cigarettes.  He started smoking about 32 years ago. He has been smoking about 1.00 pack per day. He has never used smokeless tobacco. He reports that he drinks alcohol. He reports that he uses illicit drugs (Marijuana, Benzodiazepines, Other-see comments, and Methamphetamines).  Allergies  Allergen Reactions  . Lithium Anaphylaxis  . Penicillins Rash    Family History  Problem Relation Age of Onset  . Alcohol abuse Father   . Cancer Maternal Aunt      Prior to Admission medications   Medication Sig Start Date End Date Taking? Authorizing Provider  buPROPion (WELLBUTRIN SR) 200 MG 12 hr tablet Take 200 mg by mouth 2 (two) times daily.   Yes Historical Provider, MD  gabapentin (NEURONTIN) 800 MG tablet Take 800 mg by mouth 4 (four) times daily.   Yes Historical Provider, MD  ibuprofen (ADVIL,MOTRIN) 800 MG tablet Take 800 mg by mouth every 8 (eight) hours as needed for mild pain or moderate pain.   Yes Historical Provider, MD  Tapentadol HCl (NUCYNTA) 75 MG TABS Take 75 mg by mouth 4 (four) times daily.   Yes Historical Provider, MD   Physical Exam: Filed Vitals:   05/30/14 0925 05/30/14 1150 05/30/14 1220  BP: 136/75 129/54 125/63  Pulse: 98 81 81  Temp: 98.3 F (36.8 C)  98.1  F (36.7 C)  TempSrc: Oral  Oral  Resp: 20 16 18   Height:   5\' 10"  (1.778 m)  Weight:   110.4 kg (243 lb 6.2 oz)  SpO2: 99% 95% 99%    Wt Readings from Last 3 Encounters:  05/30/14 110.4 kg (243 lb 6.2 oz)  04/21/14 107.956 kg (238 lb)  04/10/14 92.987 kg (205 lb)    General:  Appears calm and comfortable Eyes: PERRL, normal lids, irises & conjunctiva ENT: grossly normal hearing, lips & tongue, poor oral hygeine Neck: no LAD, masses or thyromegaly Cardiovascular: RRR, no m/r/g. No LE edema. Respiratory: CTA bilaterally, no  w/r/r. Normal respiratory effort. Abdomen: soft, ntnd Skin: LLE cellulitis with no induration extending up patient's LLE from the dorsum of his foot to his thigh. Musculoskeletal: grossly normal tone BUE/BLE Psychiatric: grossly normal mood and affect, speech fluent and appropriate Neurologic: grossly non-focal.          Labs on Admission:  Basic Metabolic Panel:  Recent Labs Lab 05/30/14 1006  NA 136*  K 3.9  CL 99  CO2 25  GLUCOSE 144*  BUN 17  CREATININE 0.80  CALCIUM 8.8   Liver Function Tests:  Recent Labs Lab 05/30/14 1006  AST 57*  ALT 101*  ALKPHOS 85  BILITOT 0.9  PROT 7.1  ALBUMIN 3.3*   No results found for this basename: LIPASE, AMYLASE,  in the last 168 hours No results found for this basename: AMMONIA,  in the last 168 hours CBC:  Recent Labs Lab 05/30/14 1006  WBC 24.9*  NEUTROABS 20.8*  HGB 15.0  HCT 43.8  MCV 93.6  PLT 199   Cardiac Enzymes: No results found for this basename: CKTOTAL, CKMB, CKMBINDEX, TROPONINI,  in the last 168 hours  BNP (last 3 results) No results found for this basename: PROBNP,  in the last 8760 hours CBG: No results found for this basename: GLUCAP,  in the last 168 hours  Radiological Exams on Admission: No results found.   Assessment/Plan Principle problem: Cellulitis - At this juncture will plan on continuing Vancomycin and Rocephin - Monitor white blood cell count - supportive therapy  Active Problems:   Mood disorder in conditions classified elsewhere - Patient to continue home regimen  Chronic back pain -Continue Home pain regimen    Hepatitis C virus infection without hepatic coma - Pt to f/u for routine visit with his pcp or gastroenterologist    Tobacco dependence - provide nicotine patch as pt reports smoking 1ppd  Code Status: full DVT Prophylaxis: heparin Family Communication: None Disposition Plan: Pending improvement in LE cellulitis and resolution of leukocytosis  Time spent:  > 45 minutes  [name] [name], [name] [name] [facility] Pager [phone]  **Disclaimer: This note may have been dictated with voice recognition software. Similar sounding words can inadvertently be transcribed and this note may contain transcription errors which may not have been corrected upon publication of note.**

## 2014-05-30 NOTE — ED Provider Notes (Signed)
TIME SEEN: 9:45 AM  CHIEF COMPLAINT: Left leg swelling, erythema  HPI: Patient is a 38 year old male with history of hepatitis C, bipolar disorder, 2 prior episodes of left lower extremity cellulitis who presents to the emergency department with left lower [place] he swelling or erythema that started yesterday on his dorsal foot and has now spread to his mid thigh. He states he has mild pain but denies wanting any pain medication at this time. He states he has had chills and night sweats but no documented fevers. He has had nausea but no vomiting or diarrhea. He has had prior ultrasound of his lower extremity and able to 15 when he had cellulitis that was negative for DVT. No prior history of PE or DVT. He is not a diabetic. No lesions on the left lower extremity  ROS: See HPI Constitutional: no fever  Eyes: no drainage  ENT: no runny nose   Cardiovascular:  no chest pain  Resp: no SOB  GI: no vomiting GU: no dysuria Integumentary: no rash  Allergy: no hives  Musculoskeletal: Left leg swelling  Neurological: no slurred speech ROS otherwise negative  PAST MEDICAL HISTORY/PAST SURGICAL HISTORY:  Past Medical History  Diagnosis Date  . Bipolar disorder   . Hepatitis C   . Depression   . Cellulitis     MEDICATIONS:  Prior to Admission medications   Medication Sig Start Date End Date Taking? Authorizing Provider  buPROPion (WELLBUTRIN SR) 200 MG 12 hr tablet Take 200 mg by mouth 2 (two) times daily.   Yes Historical Provider, MD  gabapentin (NEURONTIN) 800 MG tablet Take 800 mg by mouth 4 (four) times daily.   Yes Historical Provider, MD  ibuprofen (ADVIL,MOTRIN) 800 MG tablet Take 800 mg by mouth every 8 (eight) hours as needed for mild pain or moderate pain.   Yes Historical Provider, MD  Tapentadol HCl (NUCYNTA) 75 MG TABS Take 75 mg by mouth 4 (four) times daily.   Yes Historical Provider, MD    ALLERGIES:  Allergies  Allergen Reactions  . Lithium Anaphylaxis  . Penicillins  Rash    SOCIAL HISTORY:  History  Substance Use Topics  . Smoking status: Current Every Day Smoker -- 1.00 packs/day    Types: Cigarettes    Start date: 10/22/1981  . Smokeless tobacco: Never Used  . 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 Alcohol abuse Father   . Cancer Maternal Aunt     EXAM: BP 136/75  Pulse 98  Temp(Src) 98.3 F (36.8 C) (Oral)  Resp 20  SpO2 99% CONSTITUTIONAL: Alert and oriented and responds appropriately to questions. Well-appearing; well-nourished HEAD: Normocephalic EYES: Conjunctivae clear, PERRL ENT: normal nose; no rhinorrhea; moist mucous membranes; pharynx without lesions noted NECK: Supple, no meningismus, no LAD  CARD: RRR; S1 and S2 appreciated; no murmurs, no clicks, no rubs, no gallops RESP: Normal chest excursion without splinting or tachypnea; breath sounds clear and equal bilaterally; no wheezes, no rhonchi, no rales,  ABD/GI: Normal bowel sounds; non-distended; soft, non-tender, no rebound, no guarding BACK:  The back appears normal and is non-tender to palpation, there is no CVA tenderness EXT: Patient's left lower extremity is erythematous and warm from his dorsal foot to his mid thigh with pitting edema and significant swelling compared to the right lower extremity, 2+ DP pulses palpated bilaterally, patient reports he has normal sensation diffusely throughout  his extremities, no joint effusion, compartment soft, no bony deformity or ecchymosis, Normal ROM in all joints; non-tender to palpation; no edema; normal capillary refill; no cyanosis    SKIN: Normal color for age and race; warm, patient with erythema and warmth from the dorsal left foot to the proximal left thigh that is circumferential NEURO: Moves all extremities equally PSYCH: The patient's mood and manner are appropriate. Grooming and personal hygiene are appropriate.  MEDICAL DECISION  MAKING: Patient here with significant left lower extremity cellulitis. He has had chills, sweats, nausea. He is hemodynamically stable and otherwise nontoxic appearing. States the last time he had antibiotics was in April when he had cellulitis in this same leg. We'll obtain basic labs, cultures. We'll give IV vancomycin. Given the extent of his cellulitis and how quickly it is sporadic, I feel he will likely need admission for IV antibiotics.  ED PROGRESS: Patient has a leukocytosis of 24.9 with left shift. His lactate is normal. We'll discuss with hospitalist for admission for IV antibiotics.   Discussed with Dr. Cena BentonVega for admission to medical bed, inpatient for cellulitis.  Layla MawKristen N Lajuane Leatham, DO 05/30/14 1122

## 2014-05-30 NOTE — Progress Notes (Addendum)
ANTIBIOTIC CONSULT NOTE - INITIAL  Pharmacy Consult for Vancomycin/Rocephin Indication: Cellulitis  Allergies  Allergen Reactions  . Lithium Anaphylaxis  . Penicillins Rash    Patient Measurements: Height: 5\' 10"  (177.8 cm) Weight: 243 lb 6.2 oz (110.4 kg) IBW/kg (Calculated) : 73 Adjusted Body Weight: 108kg on 04/21/14  Vital Signs: Temp: 98.1 F (36.7 C) (08/09 1220) Temp src: Oral (08/09 1220) BP: 125/63 mmHg (08/09 1220) Pulse Rate: 81 (08/09 1220) Intake/Output from previous day:   Intake/Output from this shift:    Labs:  Recent Labs  05/30/14 1006  WBC 24.9*  HGB 15.0  PLT 199  CREATININE 0.80   Estimated Creatinine Clearance: 160.4 ml/min (by C-G formula based on Cr of 0.8). No results found for this basename: VANCOTROUGH, VANCOPEAK, VANCORANDOM, GENTTROUGH, GENTPEAK, GENTRANDOM, TOBRATROUGH, TOBRAPEAK, TOBRARND, AMIKACINPEAK, AMIKACINTROU, AMIKACIN,  in the last 72 hours   Microbiology: No results found for this or any previous visit (from the past 720 hour(s)).  Medical History: Past Medical History  Diagnosis Date  . Bipolar disorder   . Hepatitis C   . Depression   . Cellulitis     Assessment: [age] yoM with lover leg edema since yesterday. Hx of cellulitis. Pharmacy to dose vanco/rocephin for cellulitis.  8/9 >>vanco >>  Tmax: afebrile WBCs: pending labs Renal: no labs yet. No hx of renal dysfunction  Goal of Therapy:  Vancomycin trough level 10-15 mcg/ml Appropriate antibiotic dosing for renal function; eradication of infection  Plan:  Start Rocephin 1g IV Q24H Continue Vancomycin 1250mg  IV Q8H Measure antibiotic drug levels at steady state Follow up culture results.  Loma BostonLaura Galilee Pierron, PharmD Pager: [phone] 05/30/2014 1:33 PM

## 2014-05-30 NOTE — ED Notes (Signed)
He states he has had left lower leg edema with erythema since yesterday.  He cites hx of cellulitis.  He is in no distress.

## 2014-05-31 ENCOUNTER — Encounter (HOSPITAL_COMMUNITY): Payer: Self-pay

## 2014-05-31 DIAGNOSIS — F0630 Mood disorder due to known physiological condition, unspecified: Secondary | ICD-10-CM

## 2014-05-31 DIAGNOSIS — M549 Dorsalgia, unspecified: Secondary | ICD-10-CM | POA: Diagnosis present

## 2014-05-31 DIAGNOSIS — F17200 Nicotine dependence, unspecified, uncomplicated: Secondary | ICD-10-CM | POA: Diagnosis present

## 2014-05-31 DIAGNOSIS — F319 Bipolar disorder, unspecified: Secondary | ICD-10-CM | POA: Diagnosis present

## 2014-05-31 DIAGNOSIS — B1920 Unspecified viral hepatitis C without hepatic coma: Secondary | ICD-10-CM | POA: Diagnosis present

## 2014-05-31 DIAGNOSIS — L02419 Cutaneous abscess of limb, unspecified: Secondary | ICD-10-CM | POA: Diagnosis present

## 2014-05-31 DIAGNOSIS — G894 Chronic pain syndrome: Secondary | ICD-10-CM | POA: Diagnosis present

## 2014-05-31 DIAGNOSIS — Z880 Allergy status to penicillin: Secondary | ICD-10-CM | POA: Diagnosis not present

## 2014-05-31 LAB — CBC
HCT: 45.6 % (ref 39.0–52.0)
Hemoglobin: 15.3 g/dL (ref 13.0–17.0)
MCH: 31.4 pg (ref 26.0–34.0)
MCHC: 33.6 g/dL (ref 30.0–36.0)
MCV: 93.6 fL (ref 78.0–100.0)
PLATELETS: 175 10*3/uL (ref 150–400)
RBC: 4.87 MIL/uL (ref 4.22–5.81)
RDW: 14.1 % (ref 11.5–15.5)
WBC: 18.4 10*3/uL — ABNORMAL HIGH (ref 4.0–10.5)

## 2014-05-31 LAB — BASIC METABOLIC PANEL
ANION GAP: 13 (ref 5–15)
BUN: 13 mg/dL (ref 6–23)
CO2: 21 meq/L (ref 19–32)
Calcium: 8.7 mg/dL (ref 8.4–10.5)
Chloride: 105 mEq/L (ref 96–112)
Creatinine, Ser: 0.62 mg/dL (ref 0.50–1.35)
GFR calc Af Amer: >90 mL/min (ref >90)
Glucose, Bld: 99 mg/dL (ref 70–99)
POTASSIUM: 4.2 meq/L (ref 3.7–5.3)
SODIUM: 139 meq/L (ref 137–147)

## 2014-05-31 LAB — VANCOMYCIN, TROUGH: VANCOMYCIN TR: 5.3 ug/mL — AB (ref 10.0–20.0)

## 2014-05-31 MED ORDER — TAPENTADOL HCL 50 MG PO TABS
100.0000 mg | ORAL_TABLET | Freq: Three times a day (TID) | ORAL | Status: DC
  Administered 2014-05-31 – 2014-06-02 (×8): 100 mg via ORAL
  Filled 2014-05-31 (×8): qty 2

## 2014-05-31 MED ORDER — IBUPROFEN 200 MG PO TABS
400.0000 mg | ORAL_TABLET | Freq: Four times a day (QID) | ORAL | Status: DC | PRN
  Administered 2014-05-31 – 2014-06-01 (×3): 400 mg via ORAL
  Filled 2014-05-31 (×3): qty 2

## 2014-05-31 MED ORDER — DEXTROSE 5 % IV SOLN
1.0000 g | Freq: Two times a day (BID) | INTRAVENOUS | Status: DC
  Administered 2014-05-31 – 2014-06-01 (×4): 1 g via INTRAVENOUS
  Filled 2014-05-31 (×5): qty 1

## 2014-05-31 NOTE — Progress Notes (Signed)
UR Completed.  Camdin Hegner Jane [phone] 05/31/2014  

## 2014-05-31 NOTE — Progress Notes (Signed)
ANTIBIOTIC CONSULT NOTE - Follow-up  Pharmacy Consult for Vancomycin Indication: Cellulitis  Allergies  Allergen Reactions  . Lithium Anaphylaxis  . Penicillins Rash    Patient Measurements: Height:  (177.8 cm) Weight: 243 lb 6.2 oz (110.4 kg) IBW/kg (Calculated) : 73 Adjusted Body Weight: 108kg on 04/21/14  Vital Signs: Temp: 98.4 F (36.9 C) (08/10 1409) Temp src: Oral (08/10 1409) BP: 139/57 mmHg (08/10 1409) Pulse Rate: 81 (08/10 1409) Intake/Output from previous day: 08/09 0701 - 08/10 0700 In: 50 [IV Piggyback:50] Out: 1 [Urine:1] Intake/Output from this shift:    Labs:  Recent Labs  05/30/14 1006 05/31/14 0534  WBC 24.9* 18.4*  HGB 15.0 15.3  PLT 199 175  CREATININE 0.80 0.62   Estimated Creatinine Clearance: 160.4 ml/min (by C-G formula based on Cr of 0.62).  Recent Labs  05/31/14 1924  VANCOTROUGH 5.3*     Microbiology: Recent Results (from the past 720 hour(s))  CULTURE, BLOOD (ROUTINE X 2)     Status: None   Collection Time    05/30/14 10:06 AM      Result Value Ref Range Status   Specimen Description BLOOD RIGHT HAND  5 ML IN [facility] BOTTLE   Final   Special Requests NONE   Final   Culture  Setup Time     Final   Value: 05/30/2014 15:18     Performed at [facility]   Culture     Final   Value:        BLOOD CULTURE RECEIVED NO GROWTH TO DATE CULTURE WILL BE HELD FOR 5 DAYS BEFORE ISSUING A FINAL NEGATIVE REPORT     Performed at [facility]   Report Status PENDING   Incomplete  CULTURE, BLOOD (ROUTINE X 2)     Status: None   Collection Time    05/30/14 10:06 AM      Result Value Ref Range Status   Specimen Description BLOOD LEFT HAND  5 ML IN [facility] BOTTLE   Final   Special Requests NONE   Final   Culture  Setup Time     Final   Value: 05/30/2014 15:18     Performed at [facility]   Culture     Final   Value:        BLOOD CULTURE RECEIVED NO GROWTH TO DATE CULTURE WILL BE HELD FOR 5 DAYS BEFORE ISSUING A  FINAL NEGATIVE REPORT     Performed at [facility]   Report Status PENDING   Incomplete    Medical History: Past Medical History  Diagnosis Date  . Bipolar disorder   . Hepatitis C   . Depression   . Cellulitis     Assessment: 42 yoM with lover leg edema since yesterday. Hx of cellulitis. Pharmacy to dose vanco/rocephin for cellulitis.  8/9 >>vanco >>  8/9 >>vanco >> 8/9 >>rocephin >>8/10 8/10 >>cefepime(MD)>>  Tmax: afebrile WBCs: 18.4, improved Renal: SCr 0.62, Cl > 100N  8/9 blood: NGTD  Drug level / dose changes info: 02/05/2014 VT = 11.8 mcg/ml on  IV q8h 8/10: 1930 VT = 5.3 mcg/ml on  IV q8h prior to 4th dose  Goal of Therapy:  Vancomycin trough level 10-15 mcg/ml Appropriate antibiotic dosing for renal function; eradication of infection  Plan:   Despite low vancomycin trough, will continue with current dose of  IV q8h based on trough results from 4 months ago.    Suspect will continue to accumulate and achieve similar levels as  in April over next couple of days.  I fear he will become SUPRAtherapeutic if increase dose to  IV q8h and also increase risk of nephrotoxicity with daily dose >4gm  Juliette Alcide, PharmD, BCPS.   Pager: [phone] 05/31/2014 8:41 PM

## 2014-05-31 NOTE — Progress Notes (Signed)
[facility] PROGRESS NOTE  [name] [name] ZOX:[id number] DOB: Jan 27, 1976 DOA: 05/30/2014 PCP: [name] CommonsKECK, VALERIE, NP  Chart reviewed.  Assessment/Plan:  Principal Problem:   Cellulitis of leg, left Active Problems:   Mood disorder in conditions classified elsewhere   Hepatitis C virus infection without hepatic coma   Tobacco dependence   Chronic pain syndrome  Cellulitis no better. Reviewed records from last April. Slow to improve at that time as well, and ID consulted. Continue vancomycin. Change ceftriaxone to cefepime. Reports PCN allergy.   Reports taking nucynta 100 mg qid, scheduled, instead of 75 qid prn at home through pain clinic. Will change.  Code Status:  full Family Communication:   Disposition Plan:  home  Consultants:    Procedures:     Antibiotics:  Vancomycin 8/9 -  Rocephin 8/9 - 8/10  Cefepime 8/10 -  HPI/Subjective: C/o headache. Requesting ibuprofen. Leg no better. Requesting nucynta changed to home dose  Objective: Filed Vitals:   05/31/14 0517  BP: 115/57  Pulse: 74  Temp: 98.1 F (36.7 C)  Resp: 20    Intake/Output Summary (Last 24 hours) at 05/31/14 1120 Last data filed at 05/31/14 1016  Gross per 24 hour  Intake    290 ml  Output      3 ml  Net    287 ml   Filed Weights   05/30/14 1220  Weight: 110.4 kg (243 lb 6.2 oz)    Exam:   General:  In bed. Alert and oriented. cooperative  Cardiovascular: RRR without MGR  Respiratory: CTA without WRR  Abdomen: S, NT, ND  Ext: left foot/leg with intense erythema, swelling and warmth. Excoriations. No fluctuance or drainage  Basic Metabolic Panel:  Recent Labs Lab 05/30/14 1003 05/30/14 1006 05/31/14 0534  NA  --  136* 139  K  --  3.9 4.2  CL  --  99 105  CO2  --  25 21  GLUCOSE  --  144* 99  BUN  --  17 13  CREATININE  --  0.80 0.62  CALCIUM  --  8.8 8.7  MG 2.0  --   --   PHOS 3.3  --   --    Liver Function Tests:  Recent Labs Lab 05/30/14 1006  AST  57*  ALT 101*  ALKPHOS 85  BILITOT 0.9  PROT 7.1  ALBUMIN 3.3*   No results found for this basename: LIPASE, AMYLASE,  in the last 168 hours No results found for this basename: AMMONIA,  in the last 168 hours CBC:  Recent Labs Lab 05/30/14 1006 05/31/14 0534  WBC 24.9* 18.4*  NEUTROABS 20.8*  --   HGB 15.0 15.3  HCT 43.8 45.6  MCV 93.6 93.6  PLT 199 175   Cardiac Enzymes: No results found for this basename: CKTOTAL, CKMB, CKMBINDEX, TROPONINI,  in the last 168 hours BNP (last 3 results) No results found for this basename: PROBNP,  in the last 8760 hours CBG: No results found for this basename: GLUCAP,  in the last 168 hours  Recent Results (from the past 240 hour(s))  CULTURE, BLOOD (ROUTINE X 2)     Status: None   Collection Time    05/30/14 10:06 AM      Result Value Ref Range Status   Specimen Description BLOOD RIGHT HAND  [facility]   Final   Special Requests NONE   Final   Culture  Setup Time     Final   Value:  05/30/2014 15:18     Performed at [facility]   Culture     Final   Value:        BLOOD CULTURE RECEIVED NO GROWTH TO DATE CULTURE WILL BE HELD FOR 5 DAYS BEFORE ISSUING A FINAL NEGATIVE REPORT     Performed at [facility]   Report Status PENDING   Incomplete  CULTURE, BLOOD (ROUTINE X 2)     Status: None   Collection Time    05/30/14 10:06 AM      Result Value Ref Range Status   Specimen Description BLOOD LEFT HAND  5 ML IN [facility] BOTTLE   Final   Special Requests NONE   Final   Culture  Setup Time     Final   Value: 05/30/2014 15:18     Performed at [facility]   Culture     Final   Value:        BLOOD CULTURE RECEIVED NO GROWTH TO DATE CULTURE WILL BE HELD FOR 5 DAYS BEFORE ISSUING A FINAL NEGATIVE REPORT     Performed at [facility]   Report Status PENDING   Incomplete     Studies: No results found.  Scheduled Meds: . buPROPion  200 mg Oral BID  . ceFEPime (MAXIPIME) IV  1 g Intravenous Q12H  .  gabapentin  800 mg Oral QID  . heparin  5,000 Units Subcutaneous 3 times per day  . nicotine  21 mg Transdermal Daily  . sodium chloride  3 mL Intravenous Q12H  . tapentadol  100 mg Oral TID PC & HS  . vancomycin  1,250 mg Intravenous Q8H   Continuous Infusions:   Time spent: 35 minutes  [name] [name] L  [facility] Pager [phone]. If 7PM-7AM, please contact night-coverage at [email or web address], password [facility] 05/31/2014, 11:20 AM  LOS: 1 day

## 2014-06-01 MED ORDER — MAGNESIUM HYDROXIDE 400 MG/5ML PO SUSP: 30.0000 mL | Freq: Every day | ORAL | Status: DC | PRN

## 2014-06-01 NOTE — Progress Notes (Signed)
[facility] PROGRESS NOTE  [name] [name] ZOX:[id number] DOB: 25-Sep-1976 DOA: 05/30/2014 PCP: Holland Commons, NP  Assessment/Plan:  Principal Problem:   Cellulitis of leg, left Active Problems:   Mood disorder in conditions classified elsewhere   Hepatitis C virus infection without hepatic coma   Tobacco dependence   Chronic pain syndrome  Cellulitis much improved today! Especially foot and distal leg. Will continue vancomycin and cefepime. If continues to improve, discharge on PO abx in 1 - 2 days.  Code Status:  full Family Communication:   Disposition Plan:  home  Consultants:    Procedures:     Antibiotics:  Vancomycin 8/9 -  Rocephin 8/9 - 8/10  Cefepime 8/10 -  HPI/Subjective: HA resolved. Leg less red. No F/C.  Objective: Filed Vitals:   06/01/14 0500  BP: 119/60  Pulse: 67  Temp: 97.4 F (36.3 C)  Resp: 18    Intake/Output Summary (Last 24 hours) at 06/01/14 1315 Last data filed at 05/31/14 1750  Gross per 24 hour  Intake    480 ml  Output      3 ml  Net    477 ml   Filed Weights   05/30/14 1220 06/01/14 0500  Weight: 110.4 kg (243 lb 6.2 oz) 109.634 kg (241 lb 11.2 oz)    Exam:   General:  In bed. Alert and oriented. Cooperative. Comfortable.   Cardiovascular: RRR without MGR  Respiratory: CTA without WRR  Abdomen: S, NT, ND  Ext: left foot and distal leg much less red, less warm, less swollen. Patchy erythematous areas on thigh remain, but receding.  Basic Metabolic Panel:  Recent Labs Lab 05/30/14 1003 05/30/14 1006 05/31/14 0534  NA  --  136* 139  K  --  3.9 4.2  CL  --  99 105  CO2  --  25 21  GLUCOSE  --  144* 99  BUN  --  17 13  CREATININE  --  0.80 0.62  CALCIUM  --  8.8 8.7  MG 2.0  --   --   PHOS 3.3  --   --    Liver Function Tests:  Recent Labs Lab 05/30/14 1006  AST 57*  ALT 101*  ALKPHOS 85  BILITOT 0.9  PROT 7.1  ALBUMIN 3.3*   No results found for this basename: LIPASE, AMYLASE,  in  the last 168 hours No results found for this basename: AMMONIA,  in the last 168 hours CBC:  Recent Labs Lab 05/30/14 1006 05/31/14 0534  WBC 24.9* 18.4*  NEUTROABS 20.8*  --   HGB 15.0 15.3  HCT 43.8 45.6  MCV 93.6 93.6  PLT 199 175   Cardiac Enzymes: No results found for this basename: CKTOTAL, CKMB, CKMBINDEX, TROPONINI,  in the last 168 hours BNP (last 3 results) No results found for this basename: PROBNP,  in the last 8760 hours CBG: No results found for this basename: GLUCAP,  in the last 168 hours  Recent Results (from the past 240 hour(s))  CULTURE, BLOOD (ROUTINE X 2)     Status: None   Collection Time    05/30/14 10:06 AM      Result Value Ref Range Status   Specimen Description BLOOD RIGHT HAND  5 ML IN [facility] BOTTLE   Final   Special Requests NONE   Final   Culture  Setup Time     Final   Value: 05/30/2014 15:18     Performed at [facility]  Final   Value:        BLOOD CULTURE RECEIVED NO GROWTH TO DATE CULTURE WILL BE HELD FOR 5 DAYS BEFORE ISSUING A FINAL NEGATIVE REPORT     [facility]   Report Status PENDING   Incomplete  CULTURE, BLOOD (ROUTINE X 2)     Status: None   Collection Time    05/30/14 10:06 AM      Result Value Ref Range Status   Specimen Description BLOOD LEFT HAND  5 [facility] IN [facility]   Final   Special Requests NONE   Final   Culture  Setup Time     Final   Value: 05/30/2014 15:18     [facility]   Culture     Final   Value:        BLOOD CULTURE RECEIVED NO GROWTH TO DATE CULTURE WILL BE HELD FOR 5 DAYS BEFORE ISSUING A FINAL NEGATIVE REPORT     [facility]   Report Status PENDING   Incomplete     Studies: No results found.  Scheduled Meds: . buPROPion  200 mg Oral BID  . ceFEPime (MAXIPIME) IV  1 g Intravenous Q12H  . gabapentin  800 mg Oral QID  . heparin  5,000 Units Subcutaneous 3 times per day  . nicotine  21 mg Transdermal Daily  .  sodium chloride  3 mL Intravenous Q12H  . tapentadol  100 mg Oral TID PC & HS  . vancomycin  1,250 mg Intravenous Q8H   Continuous Infusions:   Time spent: 15 minutes  Alonnah Lampkins L  [facility] Pager [phone]. If 7PM-7AM, please contact night-coverage at [email or web address], password [email or web address] 06/01/2014, 1:15 PM  LOS: 2 days

## 2014-06-02 LAB — CBC
HCT: 41.3 % (ref 39.0–52.0)
Hemoglobin: 14.2 g/dL (ref 13.0–17.0)
MCH: 30.8 pg (ref 26.0–34.0)
MCHC: 34.4 g/dL (ref 30.0–36.0)
MCV: 89.6 fL (ref 78.0–100.0)
PLATELETS: 215 10*3/uL (ref 150–400)
RBC: 4.61 MIL/uL (ref 4.22–5.81)
RDW: 13.3 % (ref 11.5–15.5)
WBC: 10.5 10*3/uL (ref 4.0–10.5)

## 2014-06-02 MED ORDER — SULFAMETHOXAZOLE-TMP DS 800-160 MG PO TABS: 1.0000 | ORAL_TABLET | Freq: Two times a day (BID) | ORAL | Status: DC

## 2014-06-02 MED ORDER — SULFAMETHOXAZOLE-TMP DS 800-160 MG PO TABS
1.0000 | ORAL_TABLET | Freq: Two times a day (BID) | ORAL | Status: DC
  Administered 2014-06-02: 1 via ORAL
  Filled 2014-06-02 (×2): qty 1

## 2014-06-02 NOTE — Discharge Summary (Signed)
[name] [name], is a 38 y.o. male  DOB 1976-10-10  MRN [id number].  Admission date:  05/30/2014  Admitting Physician  Penny Pia, MD  Discharge Date:  06/02/2014   Primary MD  Holland Commons, NP  Recommendations for primary care physician for things to follow:   Repeat CBC CMP in a week, outpatient followup with GI for underlying history of hepatitis C   Admission Diagnosis  Left leg cellulitis [682.6]   Discharge Diagnosis  Left leg cellulitis [682.6]    Principal Problem:   Cellulitis of leg, left Active Problems:   Mood disorder in conditions classified elsewhere   Hepatitis C virus infection without hepatic coma   Tobacco dependence   Chronic pain syndrome      Past Medical History  Diagnosis Date  . Bipolar disorder   . Hepatitis C   . Depression   . Cellulitis     Past Surgical History  Procedure Laterality Date  . Back surgery    . Shoulder surgery         History of present illness and  Hospital Course:     Kindly see H&P for history of present illness and admission details, please review complete Labs, Consult reports and Test reports for all details in brief  HPI  from the history and physical done on the day of admission  [name] [name] is a 38 y.o. male with history of bipolar, chronic back pain on the symptoms, nicotine dependence, and hepatitis C. Who presents to the ED complaining of left lower extremity erythema. He states that the leg is not too uncomfortable but the redness spreads within 24 hour period. He first noticed it at the dorsum of his left foot and it spread up to his thigh. He is not sure what makes it better or worse. Since onset the problem has persisted and is getting worse as described above. While in house patient was found to have a white blood cell count 24,000 but was afebrile.  We were consulted for management of acute left lower extremity cellulitis.    Hospital Course    1. Left leg cellulitis. Much improved after couple of days of IV antibiotics, afebrile, leukocytosis resolved, blood cultures negative to date, will place him on Bactrim orally for the next few more days with outpatient follow with PCP.   2. Underlying history of hepatitis C . Requested  to follow with PCP for underlying chronic issues, request PCP to consider outpatient GI followup for history of hepatitis C.   3. History of smoking counseled to quit.   4. Depression. On the due to Neurontin continue.   5. Chronic pain on Nucynta which will be continued.     Discharge Condition: stable   Follow UP  Follow-up Information   Follow up with Holland Commons, NP. Schedule an appointment as soon as possible for a visit in 1 week.   Specialty:  Internal Medicine   Contact information:   [address] [place] [address] [phone]  Discharge Instructions  and  Discharge Medications     Discharge Instructions   Diet - low sodium heart healthy    Complete by:  As directed      Discharge instructions    Complete by:  As directed   Follow with Primary MD Holland Commons, NP in 7 days   Get CBC, CMP checked  by Primary MD next visit.    Activity: As tolerated with Full fall precautions use walker/cane & assistance as needed   Disposition Home     Diet: Heart Healthy    For Heart failure patients - Check your Weight same time everyday, if you gain over 2 pounds, or you develop in leg swelling, experience more shortness of breath or chest pain, call your Primary MD immediately. Follow Cardiac Low Salt Diet and 1.8 lit/day fluid restriction.   On your next visit with her primary care physician please Get Medicines reviewed and adjusted.  Please request your Prim.MD to go over all Hospital Tests and Procedure/Radiological results at the follow up, please get  all Hospital records sent to your Prim MD by signing hospital release before you go home.   If you experience worsening of your admission symptoms, develop shortness of breath, life threatening emergency, suicidal or homicidal thoughts you must seek medical attention immediately by calling 911 or calling your MD immediately  if symptoms less severe.  You Must read complete instructions/literature along with all the possible adverse reactions/side effects for all the Medicines you take and that have been prescribed to you. Take any new Medicines after you have completely understood and accpet all the possible adverse reactions/side effects.   Do not drive, operating heavy machinery, perform activities at heights, swimming or participation in water activities or provide baby sitting services if your were admitted for syncope or siezures until you have seen by Primary MD or a Neurologist and advised to do so again.  Do not drive when taking Pain medications.    Do not take more than prescribed Pain, Sleep and Anxiety Medications  Special Instructions: If you have smoked or chewed Tobacco  in the last 2 yrs please stop smoking, stop any regular Alcohol  and or any Recreational drug use.  Wear Seat belts while driving.   Please note  You were cared for by a hospitalist during your hospital stay. If you have any questions about your discharge medications or the care you received while you were in the hospital after you are discharged, you can call the unit and asked to speak with the hospitalist on call if the hospitalist that took care of you is not available. Once you are discharged, your primary care physician will handle any further medical issues. Please note that NO REFILLS for any discharge medications will be authorized once you are discharged, as it is imperative that you return to your primary care physician (or establish a relationship with a primary care physician if you do not have one)  for your aftercare needs so that they can reassess your need for medications and monitor your lab values.     Increase activity slowly    Complete by:  As directed             Medication List         buPROPion 200 MG 12 hr tablet  Commonly known as:  WELLBUTRIN SR  Take 200 mg by mouth 2 (two) times daily.     gabapentin 800 MG tablet  Commonly known as:  NEURONTIN  Take 800 mg by mouth 4 (four) times daily.     ibuprofen 800 MG tablet  Commonly known as:  ADVIL,MOTRIN  Take 800 mg by mouth every 8 (eight) hours as needed for mild pain or moderate pain.     NUCYNTA 75 MG Tabs  Generic drug:  Tapentadol HCl  Take 75 mg by mouth 4 (four) times daily.     sulfamethoxazole-trimethoprim 800-160 MG per tablet  Commonly known as:  BACTRIM DS  Take 1 tablet by mouth every 12 (twelve) hours.          Diet and Activity recommendation: See Discharge Instructions above   Consults obtained -    Major procedures and Radiology Reports - PLEASE review detailed and final reports for all details, in brief -      No results found.  Micro Results     Recent Results (from the past 240 hour(s))  CULTURE, BLOOD (ROUTINE X 2)     Status: None   Collection Time    05/30/14 10:06 AM      Result Value Ref Range Status   Specimen Description BLOOD RIGHT HAND  5 ML IN [facility] BOTTLE   Final   Special Requests NONE   Final   Culture  Setup Time     Final   Value: 05/30/2014 15:18     Performed at [facility]   Culture     Final   Value:        BLOOD CULTURE RECEIVED NO GROWTH TO DATE CULTURE WILL BE HELD FOR 5 DAYS BEFORE ISSUING A FINAL NEGATIVE REPORT     Performed at [facility]   Report Status PENDING   Incomplete  CULTURE, BLOOD (ROUTINE X 2)     Status: None   Collection Time    05/30/14 10:06 AM      Result Value Ref Range Status   Specimen Description BLOOD LEFT HAND  5 ML IN [facility] BOTTLE   Final   Special Requests NONE   Final   Culture  Setup Time      Final   Value: 05/30/2014 15:18     Performed at [facility]   Culture     Final   Value:        BLOOD CULTURE RECEIVED NO GROWTH TO DATE CULTURE WILL BE HELD FOR 5 DAYS BEFORE ISSUING A FINAL NEGATIVE REPORT     Performed at [facility]   Report Status PENDING   Incomplete       Today   Subjective:   [name] [name] today has no headache,no chest abdominal pain,no new weakness tingling or numbness, feels much better wants to go home today.    Objective:   Blood pressure 126/67, pulse 78, temperature 98.4 F (36.9 C), temperature source Oral, resp. rate 18, height 5\' 10"  (1.778 m), weight 109.634 kg (241 lb 11.2 oz), SpO2 93.00%.   Intake/Output Summary (Last 24 hours) at 06/02/14 1150 Last data filed at 06/02/14 1100  Gross per 24 hour  Intake   1083 ml  Output      7 ml  Net   1076 ml    Exam Awake Alert, Oriented x 3, No new F.N deficits, Normal affect .AT,PERRAL Supple Neck,No JVD, No cervical lymphadenopathy appriciated.  Symmetrical Chest wall movement, Good air movement bilaterally, CTAB RRR,No Gallops,Rubs or new Murmurs, No Parasternal Heave +ve B.Sounds, Abd Soft, Non tender, No organomegaly appriciated, No rebound -guarding or rigidity. No Cyanosis,  Clubbing or edema, No new Rash or bruise, left leg cellulitis almost completely resolved  Data Review   CBC w Diff: Lab Results  Component Value Date   WBC 10.5 06/02/2014   HGB 14.2 06/02/2014   HCT 41.3 06/02/2014   PLT 215 06/02/2014   LYMPHOPCT 10* 05/30/2014   MONOPCT 6 05/30/2014   EOSPCT 1 05/30/2014   BASOPCT 0 05/30/2014    CMP: Lab Results  Component Value Date   NA 139 05/31/2014   K 4.2 05/31/2014   CL 105 05/31/2014   CO2 21 05/31/2014   BUN 13 05/31/2014   CREATININE 0.62 05/31/2014   CREATININE 0.79 04/05/2014   PROT 7.1 05/30/2014   ALBUMIN 3.3* 05/30/2014   BILITOT 0.9 05/30/2014   BILITOT 0.3 04/21/2014   ALKPHOS 85 05/30/2014   AST 57* 05/30/2014   ALT 101* 05/30/2014   ALT 123* 04/21/2014    .   Total Time in preparing paper work, data evaluation and todays exam - 35 minutes  Leroy SeaSINGH,PRASHANT K M.D on 06/02/2014 at 11:50 AM  [facility] Office  [phone]   **Disclaimer: This note may have been dictated with voice recognition software. Similar sounding words can inadvertently be transcribed and this note may contain transcription errors which may not have been corrected upon publication of note.**

## 2014-06-02 NOTE — Progress Notes (Signed)
Patient discharge home with friend, alert and oriented, discharge instructions given, patient verbalize understanding of discharge orders given, My Chart access denied at this time, patient in stable condition at this time

## 2014-06-02 NOTE — Discharge Instructions (Signed)
Follow with Primary MD Holland CommonsKECK, VALERIE, NP in 7 days   Get CBC, CMP checked  by Primary MD next visit.    Activity: As tolerated with Full fall precautions use walker/cane & assistance as needed   Disposition Home     Diet: Heart Healthy    For Heart failure patients - Check your Weight same time everyday, if you gain over 2 pounds, or you develop in leg swelling, experience more shortness of breath or chest pain, call your Primary MD immediately. Follow Cardiac Low Salt Diet and 1.8 lit/day fluid restriction.   On your next visit with her primary care physician please Get Medicines reviewed and adjusted.  Please request your Prim.MD to go over all Hospital Tests and Procedure/Radiological results at the follow up, please get all Hospital records sent to your Prim MD by signing hospital release before you go home.   If you experience worsening of your admission symptoms, develop shortness of breath, life threatening emergency, suicidal or homicidal thoughts you must seek medical attention immediately by calling 911 or calling your MD immediately  if symptoms less severe.  You Must read complete instructions/literature along with all the possible adverse reactions/side effects for all the Medicines you take and that have been prescribed to you. Take any new Medicines after you have completely understood and accpet all the possible adverse reactions/side effects.   Do not drive, operating heavy machinery, perform activities at heights, swimming or participation in water activities or provide baby sitting services if your were admitted for syncope or siezures until you have seen by Primary MD or a Neurologist and advised to do so again.  Do not drive when taking Pain medications.    Do not take more than prescribed Pain, Sleep and Anxiety Medications  Special Instructions: If you have smoked or chewed Tobacco  in the last 2 yrs please stop smoking, stop any regular Alcohol  and or any  Recreational drug use.  Wear Seat belts while driving.   Please note  You were cared for by a hospitalist during your hospital stay. If you have any questions about your discharge medications or the care you received while you were in the hospital after you are discharged, you can call the unit and asked to speak with the hospitalist on call if the hospitalist that took care of you is not available. Once you are discharged, your primary care physician will handle any further medical issues. Please note that NO REFILLS for any discharge medications will be authorized once you are discharged, as it is imperative that you return to your primary care physician (or establish a relationship with a primary care physician if you do not have one) for your aftercare needs so that they can reassess your need for medications and monitor your lab values.

## 2014-06-03 ENCOUNTER — Other Ambulatory Visit: Payer: Self-pay | Admitting: Pain Medicine

## 2014-06-03 DIAGNOSIS — M545 Low back pain, unspecified: Secondary | ICD-10-CM

## 2014-06-03 DIAGNOSIS — M542 Cervicalgia: Secondary | ICD-10-CM

## 2014-06-05 LAB — CULTURE, BLOOD (ROUTINE X 2)
CULTURE: NO GROWTH
Culture: NO GROWTH

## 2014-06-09 ENCOUNTER — Other Ambulatory Visit: Payer: Medicare Other

## 2014-06-09 ENCOUNTER — Other Ambulatory Visit: Payer: Self-pay | Admitting: Pain Medicine

## 2014-06-09 DIAGNOSIS — Z139 Encounter for screening, unspecified: Secondary | ICD-10-CM

## 2014-06-10 ENCOUNTER — Ambulatory Visit
Admission: RE | Admit: 2014-06-10 | Discharge: 2014-06-10 | Disposition: A | Discharge status: Home / Self Care | Payer: Medicare Other | Source: Ambulatory Visit | Attending: Pain Medicine | Admitting: Pain Medicine

## 2014-06-10 DIAGNOSIS — Z139 Encounter for screening, unspecified: Secondary | ICD-10-CM

## 2014-06-11 ENCOUNTER — Other Ambulatory Visit: Payer: Medicare Other

## 2014-06-16 ENCOUNTER — Ambulatory Visit
Admission: RE | Admit: 2014-06-16 | Discharge: 2014-06-16 | Disposition: A | Discharge status: Home / Self Care | Payer: Medicare Other | Source: Ambulatory Visit | Attending: Pain Medicine | Admitting: Pain Medicine

## 2014-06-16 DIAGNOSIS — M542 Cervicalgia: Secondary | ICD-10-CM

## 2014-06-16 DIAGNOSIS — M545 Low back pain, unspecified: Secondary | ICD-10-CM

## 2014-09-09 ENCOUNTER — Encounter (HOSPITAL_COMMUNITY): Payer: Self-pay | Admitting: *Deleted

## 2014-09-09 ENCOUNTER — Emergency Department (EMERGENCY_DEPARTMENT_HOSPITAL)
Admission: EM | Admit: 2014-09-09 | Discharge: 2014-09-09 | Disposition: A | Discharge status: Other Acute Inpatient Hospital | Payer: Medicare Other | Source: Home / Self Care | Attending: Emergency Medicine | Admitting: Emergency Medicine

## 2014-09-09 ENCOUNTER — Inpatient Hospital Stay (HOSPITAL_COMMUNITY)
Admission: AD | Admit: 2014-09-09 | Discharge: 2014-09-14 | DRG: 885 | Disposition: A | Discharge status: Other Acute Inpatient Hospital | Payer: Medicare Other | Source: Intra-hospital | Attending: Psychiatry | Admitting: Psychiatry

## 2014-09-09 DIAGNOSIS — F1024 Alcohol dependence with alcohol-induced mood disorder: Secondary | ICD-10-CM | POA: Insufficient documentation

## 2014-09-09 DIAGNOSIS — Z79899 Other long term (current) drug therapy: Secondary | ICD-10-CM

## 2014-09-09 DIAGNOSIS — R45851 Suicidal ideations: Secondary | ICD-10-CM | POA: Diagnosis present

## 2014-09-09 DIAGNOSIS — G8929 Other chronic pain: Secondary | ICD-10-CM | POA: Diagnosis present

## 2014-09-09 DIAGNOSIS — Z880 Allergy status to penicillin: Secondary | ICD-10-CM | POA: Insufficient documentation

## 2014-09-09 DIAGNOSIS — F10930 Alcohol use, unspecified with withdrawal, uncomplicated: Secondary | ICD-10-CM

## 2014-09-09 DIAGNOSIS — F1290 Cannabis use, unspecified, uncomplicated: Secondary | ICD-10-CM | POA: Diagnosis present

## 2014-09-09 DIAGNOSIS — Z872 Personal history of diseases of the skin and subcutaneous tissue: Secondary | ICD-10-CM | POA: Insufficient documentation

## 2014-09-09 DIAGNOSIS — F1019 Alcohol abuse with unspecified alcohol-induced disorder: Secondary | ICD-10-CM

## 2014-09-09 DIAGNOSIS — F1020 Alcohol dependence, uncomplicated: Secondary | ICD-10-CM | POA: Diagnosis present

## 2014-09-09 DIAGNOSIS — F10939 Alcohol use, unspecified with withdrawal, unspecified: Secondary | ICD-10-CM | POA: Diagnosis present

## 2014-09-09 DIAGNOSIS — Z720 Tobacco use: Secondary | ICD-10-CM

## 2014-09-09 DIAGNOSIS — Z8619 Personal history of other infectious and parasitic diseases: Secondary | ICD-10-CM

## 2014-09-09 DIAGNOSIS — F251 Schizoaffective disorder, depressive type: Secondary | ICD-10-CM | POA: Diagnosis present

## 2014-09-09 DIAGNOSIS — F332 Major depressive disorder, recurrent severe without psychotic features: Secondary | ICD-10-CM | POA: Insufficient documentation

## 2014-09-09 DIAGNOSIS — F1994 Other psychoactive substance use, unspecified with psychoactive substance-induced mood disorder: Secondary | ICD-10-CM

## 2014-09-09 DIAGNOSIS — F10230 Alcohol dependence with withdrawal, uncomplicated: Secondary | ICD-10-CM | POA: Insufficient documentation

## 2014-09-09 DIAGNOSIS — F1920 Other psychoactive substance dependence, uncomplicated: Secondary | ICD-10-CM | POA: Diagnosis present

## 2014-09-09 DIAGNOSIS — F411 Generalized anxiety disorder: Secondary | ICD-10-CM | POA: Diagnosis present

## 2014-09-09 DIAGNOSIS — F329 Major depressive disorder, single episode, unspecified: Secondary | ICD-10-CM | POA: Diagnosis present

## 2014-09-09 DIAGNOSIS — F319 Bipolar disorder, unspecified: Secondary | ICD-10-CM | POA: Diagnosis present

## 2014-09-09 DIAGNOSIS — F1124 Opioid dependence with opioid-induced mood disorder: Secondary | ICD-10-CM | POA: Insufficient documentation

## 2014-09-09 DIAGNOSIS — F419 Anxiety disorder, unspecified: Secondary | ICD-10-CM | POA: Insufficient documentation

## 2014-09-09 DIAGNOSIS — F1120 Opioid dependence, uncomplicated: Secondary | ICD-10-CM | POA: Insufficient documentation

## 2014-09-09 DIAGNOSIS — F17210 Nicotine dependence, cigarettes, uncomplicated: Secondary | ICD-10-CM | POA: Diagnosis present

## 2014-09-09 DIAGNOSIS — F10239 Alcohol dependence with withdrawal, unspecified: Secondary | ICD-10-CM | POA: Diagnosis present

## 2014-09-09 DIAGNOSIS — F10932 Alcohol use, unspecified with withdrawal with perceptual disturbance: Secondary | ICD-10-CM | POA: Diagnosis present

## 2014-09-09 LAB — CBC
HCT: 48.2 % (ref 39.0–52.0)
Hemoglobin: 17 g/dL (ref 13.0–17.0)
MCH: 32.8 pg (ref 26.0–34.0)
MCHC: 35.3 g/dL (ref 30.0–36.0)
MCV: 92.9 fL (ref 78.0–100.0)
PLATELETS: 330 10*3/uL (ref 150–400)
RBC: 5.19 MIL/uL (ref 4.22–5.81)
RDW: 13.6 % (ref 11.5–15.5)
WBC: 18.9 10*3/uL — AB (ref 4.0–10.5)

## 2014-09-09 LAB — COMPREHENSIVE METABOLIC PANEL
ALBUMIN: 4 g/dL (ref 3.5–5.2)
ALT: 78 U/L — AB (ref 0–53)
AST: 49 U/L — AB (ref 0–37)
Alkaline Phosphatase: 76 U/L (ref 39–117)
Anion gap: 15 (ref 5–15)
BILIRUBIN TOTAL: 0.7 mg/dL (ref 0.3–1.2)
BUN: 12 mg/dL (ref 6–23)
CALCIUM: 9.6 mg/dL (ref 8.4–10.5)
CHLORIDE: 101 meq/L (ref 96–112)
CO2: 22 mEq/L (ref 19–32)
Creatinine, Ser: 0.73 mg/dL (ref 0.50–1.35)
GFR calc Af Amer: >90 mL/min (ref >90)
Glucose, Bld: 120 mg/dL — ABNORMAL HIGH (ref 70–99)
Potassium: 3.9 mEq/L (ref 3.7–5.3)
SODIUM: 138 meq/L (ref 137–147)
Total Protein: 7.6 g/dL (ref 6.0–8.3)

## 2014-09-09 LAB — ETHANOL: Alcohol, Ethyl (B): <11 mg/dL (ref 0–11)

## 2014-09-09 LAB — RAPID URINE DRUG SCREEN, HOSP PERFORMED
AMPHETAMINES: NOT DETECTED
BENZODIAZEPINES: NOT DETECTED
Barbiturates: NOT DETECTED
Cocaine: NOT DETECTED
Opiates: NOT DETECTED
Tetrahydrocannabinol: POSITIVE — AB

## 2014-09-09 LAB — SALICYLATE LEVEL

## 2014-09-09 LAB — ACETAMINOPHEN LEVEL: Acetaminophen (Tylenol), Serum: <15 ug/mL (ref 10–30)

## 2014-09-09 MED ORDER — ACETAMINOPHEN 325 MG PO TABS
650.0000 mg | ORAL_TABLET | ORAL | Status: DC | PRN
Start: 2014-09-09 — End: 2014-09-09

## 2014-09-09 MED ORDER — OXYCODONE HCL 5 MG PO TABS: 10.0000 mg | ORAL_TABLET | Freq: Four times a day (QID) | ORAL | Status: DC

## 2014-09-09 MED ORDER — VITAMIN B-1 100 MG PO TABS
100.0000 mg | ORAL_TABLET | Freq: Every day | ORAL | Status: DC
Start: 2014-09-10 — End: 2014-09-09

## 2014-09-09 MED ORDER — HYDROXYZINE HCL 25 MG PO TABS
25.0000 mg | ORAL_TABLET | Freq: Four times a day (QID) | ORAL | Status: DC | PRN
  Administered 2014-09-09 – 2014-09-10 (×2): 25 mg via ORAL
  Filled 2014-09-09 (×2): qty 1

## 2014-09-09 MED ORDER — SERTRALINE HCL 50 MG PO TABS
25.0000 mg | ORAL_TABLET | Freq: Every day | ORAL | Status: DC
  Administered 2014-09-09: 25 mg via ORAL
  Filled 2014-09-09: qty 1

## 2014-09-09 MED ORDER — ONDANSETRON HCL 4 MG PO TABS: 4.0000 mg | ORAL_TABLET | Freq: Three times a day (TID) | ORAL | Status: DC | PRN

## 2014-09-09 MED ORDER — ZOLPIDEM TARTRATE 5 MG PO TABS: 5.0000 mg | ORAL_TABLET | Freq: Every evening | ORAL | Status: DC | PRN

## 2014-09-09 MED ORDER — ALUM & MAG HYDROXIDE-SIMETH 200-200-20 MG/5ML PO SUSP: 30.0000 mL | ORAL | Status: DC | PRN

## 2014-09-09 MED ORDER — GABAPENTIN 400 MG PO CAPS
800.0000 mg | ORAL_CAPSULE | Freq: Four times a day (QID) | ORAL | Status: DC
  Administered 2014-09-09 – 2014-09-14 (×19): 800 mg via ORAL
  Filled 2014-09-09 (×5): qty 2
  Filled 2014-09-09: qty 112
  Filled 2014-09-09 (×18): qty 2

## 2014-09-09 MED ORDER — ADULT MULTIVITAMIN W/MINERALS CH
1.0000 | ORAL_TABLET | Freq: Every day | ORAL | Status: DC
  Administered 2014-09-10: 1 via ORAL
  Filled 2014-09-09 (×3): qty 1

## 2014-09-09 MED ORDER — GABAPENTIN 400 MG PO CAPS
800.0000 mg | ORAL_CAPSULE | Freq: Four times a day (QID) | ORAL | Status: DC
  Administered 2014-09-09 (×2): 800 mg via ORAL
  Filled 2014-09-09 (×2): qty 2

## 2014-09-09 MED ORDER — BUPROPION HCL ER (SR) 100 MG PO TB12
200.0000 mg | ORAL_TABLET | Freq: Two times a day (BID) | ORAL | Status: DC
  Administered 2014-09-09 – 2014-09-10 (×2): 200 mg via ORAL
  Filled 2014-09-09 (×7): qty 2

## 2014-09-09 MED ORDER — NICOTINE 21 MG/24HR TD PT24
21.0000 mg | MEDICATED_PATCH | Freq: Every day | TRANSDERMAL | Status: DC
  Administered 2014-09-10 – 2014-09-13 (×4): 21 mg via TRANSDERMAL
  Filled 2014-09-09 (×6): qty 1

## 2014-09-09 MED ORDER — SERTRALINE HCL 25 MG PO TABS
25.0000 mg | ORAL_TABLET | Freq: Every day | ORAL | Status: DC
  Administered 2014-09-10: 25 mg via ORAL
  Filled 2014-09-09 (×3): qty 1

## 2014-09-09 MED ORDER — BUPROPION HCL ER (SR) 100 MG PO TB12
200.0000 mg | ORAL_TABLET | Freq: Two times a day (BID) | ORAL | Status: DC
  Administered 2014-09-09: 200 mg via ORAL
  Filled 2014-09-09 (×2): qty 2

## 2014-09-09 MED ORDER — HYDROXYZINE HCL 25 MG PO TABS: 25.0000 mg | ORAL_TABLET | Freq: Four times a day (QID) | ORAL | Status: DC | PRN

## 2014-09-09 MED ORDER — LORAZEPAM 1 MG PO TABS
1.0000 mg | ORAL_TABLET | Freq: Three times a day (TID) | ORAL | Status: DC | PRN
  Administered 2014-09-09 (×2): 1 mg via ORAL
  Filled 2014-09-09 (×2): qty 1

## 2014-09-09 MED ORDER — IBUPROFEN 600 MG PO TABS: 600.0000 mg | ORAL_TABLET | Freq: Three times a day (TID) | ORAL | Status: DC | PRN

## 2014-09-09 MED ORDER — IBUPROFEN 200 MG PO TABS: 600.0000 mg | ORAL_TABLET | Freq: Three times a day (TID) | ORAL | Status: DC | PRN

## 2014-09-09 MED ORDER — CHLORDIAZEPOXIDE HCL 25 MG PO CAPS: 25.0000 mg | ORAL_CAPSULE | Freq: Four times a day (QID) | ORAL | Status: DC | PRN

## 2014-09-09 MED ORDER — THIAMINE HCL 100 MG/ML IJ SOLN
100.0000 mg | Freq: Once | INTRAMUSCULAR | Status: DC
Start: 2014-09-09 — End: 2014-09-09

## 2014-09-09 MED ORDER — VITAMIN B-1 100 MG PO TABS
100.0000 mg | ORAL_TABLET | Freq: Every day | ORAL | Status: DC
  Administered 2014-09-10: 100 mg via ORAL
  Filled 2014-09-09 (×3): qty 1

## 2014-09-09 MED ORDER — MAGNESIUM HYDROXIDE 400 MG/5ML PO SUSP: 30.0000 mL | Freq: Every day | ORAL | Status: DC | PRN

## 2014-09-09 MED ORDER — LOPERAMIDE HCL 2 MG PO CAPS: 2.0000 mg | ORAL_CAPSULE | ORAL | Status: DC | PRN

## 2014-09-09 MED ORDER — THIAMINE HCL 100 MG/ML IJ SOLN: 100.0000 mg | Freq: Once | INTRAMUSCULAR | Status: DC

## 2014-09-09 MED ORDER — ONDANSETRON HCL 4 MG PO TABS
4.0000 mg | ORAL_TABLET | Freq: Three times a day (TID) | ORAL | Status: DC | PRN
  Administered 2014-09-09 – 2014-09-10 (×2): 4 mg via ORAL
  Filled 2014-09-09 (×2): qty 1

## 2014-09-09 MED ORDER — CHLORDIAZEPOXIDE HCL 25 MG PO CAPS
25.0000 mg | ORAL_CAPSULE | Freq: Four times a day (QID) | ORAL | Status: DC | PRN
  Administered 2014-09-09 – 2014-09-10 (×2): 25 mg via ORAL
  Filled 2014-09-09 (×2): qty 1

## 2014-09-09 MED ORDER — ADULT MULTIVITAMIN W/MINERALS CH
1.0000 | ORAL_TABLET | Freq: Every day | ORAL | Status: DC
  Administered 2014-09-09: 1 via ORAL
  Filled 2014-09-09: qty 1

## 2014-09-09 MED ORDER — LORAZEPAM 1 MG PO TABS
2.0000 mg | ORAL_TABLET | Freq: Once | ORAL | Status: AC
  Administered 2014-09-09: 2 mg via ORAL
  Filled 2014-09-09: qty 2

## 2014-09-09 MED ORDER — NICOTINE 21 MG/24HR TD PT24
21.0000 mg | MEDICATED_PATCH | Freq: Every day | TRANSDERMAL | Status: DC
  Administered 2014-09-09: 21 mg via TRANSDERMAL
  Filled 2014-09-09: qty 1

## 2014-09-09 MED ORDER — TRAZODONE HCL 50 MG PO TABS
50.0000 mg | ORAL_TABLET | Freq: Every evening | ORAL | Status: DC | PRN
  Administered 2014-09-09 – 2014-09-13 (×8): 50 mg via ORAL
  Filled 2014-09-09 (×14): qty 1
  Filled 2014-09-09: qty 28
  Filled 2014-09-09: qty 1

## 2014-09-09 NOTE — ED Notes (Signed)
Pt states that he wants Detox from ETOH and "pills"; pt states that he dranks " all day long"; pt states that he is unsure how much ETOH he consumes daily but states its approx a case; pt states that he takes "pills" whatever he can get; pt states that he has not had any "pills" in approx 1 week; pt states that he wants to die and has a plan to jump off a bridge; pt arrives soaking wet from walking in the rain

## 2014-09-09 NOTE — Progress Notes (Signed)
Recreation Therapy Notes  Animal-Assisted Activity/Therapy (AAA/T) Program Checklist/Progress Notes Patient Eligibility Criteria Checklist & Daily Group note for Rec Tx Intervention  Date: 11.19.2015 Time: 2:45pm Location: [address] Dayroom    AAA/T Program Assumption of Risk Form signed by Patient/ or Parent Legal Guardian yes  Patient is free of allergies or sever asthma yes  Patient reports no fear of animals yes  Patient reports no history of cruelty to animals yes   Patient understands his/her participation is voluntary yes  Behavioral Response: Did not attend.   Marykay Lexenise L Yue Flanigan, LRT/CTRS  Cerenity Goshorn L 09/09/2014 4:20 PM

## 2014-09-09 NOTE — ED Provider Notes (Signed)
CSN: [id number]     Arrival date & time 09/09/14  0243 History   First MD Initiated Contact with Patient 09/09/14 0250     Chief Complaint  Patient presents with  . Suicidal  . Alcohol Problem    (Consider location/radiation/quality/duration/timing/severity/associated sxs/prior Treatment) HPI Comments: 38 year old male with a history of bipolar disorder, hepatitis C, and depression presents to the emergency department for further psychiatric evaluation. Patient states that he was doing well and remaining sober as well as compliant with his psychiatric medications up until 3 months ago. Patient states that he has been noncompliant with his meds since this time and has been drinking frequently. Patient cannot quantify the amount of alcohol he has been drinking, but states that he has been drinking both beer and liquor and excess. Last drink was at 1800 yesterday. Patient also endorses "chewing" and snorting opiate pain medication to become high. He states he has been experiencing worsening depression as well as suicidal ideations with plans to jump off a bridge. No homicidal thoughts.  Patient is a 38 y.o. male presenting with alcohol problem. The history is provided by the patient. No language interpreter was used.  Alcohol Problem    Past Medical History  Diagnosis Date  . Bipolar disorder   . Hepatitis C   . Depression   . Cellulitis    Past Surgical History  Procedure Laterality Date  . Back surgery    . Shoulder surgery     Family History  Problem Relation Age of Onset  . Alcohol abuse Father   . Cancer Maternal Aunt    History  Substance Use Topics  . Smoking status: Current Every Day Smoker -- 1.00 packs/day    Types: Cigarettes    Start date: 10/22/1981  . Smokeless tobacco: Never Used  . Alcohol Use: Yes     Comment: pt states approx a case a day    Review of Systems  Psychiatric/Behavioral: Positive for suicidal ideas and behavioral problems.  All other systems  reviewed and are negative.   Allergies  Lithium and Penicillins  Home Medications   Prior to Admission medications   Medication Sig Start Date End Date Taking? Authorizing Provider  buPROPion (WELLBUTRIN SR) 200 MG 12 hr tablet Take 200 mg by mouth 2 (two) times daily.   Yes Historical Provider, MD  gabapentin (NEURONTIN) 800 MG tablet Take 800 mg by mouth 4 (four) times daily.   Yes Historical Provider, MD  ibuprofen (ADVIL,MOTRIN) 800 MG tablet Take 800 mg by mouth every 8 (eight) hours as needed for mild pain or moderate pain.   Yes Historical Provider, MD  Tapentadol HCl (NUCYNTA) 75 MG TABS Take 75 mg by mouth 4 (four) times daily.   Yes Historical Provider, MD  sulfamethoxazole-trimethoprim (BACTRIM DS) 800-160 MG per tablet Take 1 tablet by mouth every 12 (twelve) hours. 06/02/14   Leroy SeaPrashant K Singh, MD   BP 122/65 mmHg  Pulse 74  Temp(Src) 98.5 F (36.9 C) (Oral)  Resp 18  SpO2 96%   Physical Exam  Constitutional: He is oriented to person, place, and time. He appears well-developed and well-nourished. No distress.  Patient appears disheveled  HENT:  Head: Normocephalic and atraumatic.  Eyes: Conjunctivae and EOM are normal. No scleral icterus.  Neck: Normal range of motion.  Pulmonary/Chest: Effort normal. No respiratory distress.  Musculoskeletal: Normal range of motion.  Neurological: He is alert and oriented to person, place, and time. He exhibits normal muscle tone. Coordination normal.  Skin: Skin  is warm and dry. No rash noted. He is not diaphoretic. No erythema. No pallor.  Psychiatric: His speech is normal. His mood appears anxious. He is withdrawn. He expresses suicidal ideation. He expresses no homicidal ideation. He expresses suicidal plans.  Nursing note and vitals reviewed.   ED Course  Procedures (including critical care time) Labs Review Labs Reviewed  CBC - Abnormal; Notable for the following:    WBC 18.9 (*)    All other components within normal  limits  COMPREHENSIVE METABOLIC PANEL - Abnormal; Notable for the following:    Glucose, Bld 120 (*)    AST 49 (*)    ALT 78 (*)    All other components within normal limits  SALICYLATE LEVEL - Abnormal; Notable for the following:    Salicylate Lvl <2.0 (*)    All other components within normal limits  URINE RAPID DRUG SCREEN (HOSP PERFORMED) - Abnormal; Notable for the following:    Tetrahydrocannabinol POSITIVE (*)    All other components within normal limits  ACETAMINOPHEN LEVEL  ETHANOL    Imaging Review No results found.   EKG Interpretation None      MDM   Final diagnoses:  Suicidal ideations    38 year old male presents to the emergency department for further evaluation of polysubstance abuse as well as suicidal thoughts. While patient endorses frequent alcohol use as well as improper use of opiate medication, he is positive for neither one workup today. Urine is positive for THC. Patient noted have a leukocytosis which appears consistent with his baseline. Labs otherwise unremarkable and patient medically cleared. Patient has been evaluated by TTS and does not meet criteria for inpatient treatment for polysubstance abuse. He, however, is unable to contract for safety and will be evaluated by psychiatry in the morning. Disposition to be determined by oncoming ED provider.   Filed Vitals:   09/09/14 0303 09/09/14 0454  BP: 166/89 122/65  Pulse: 97 74  Temp: 98.2 F (36.8 C) 98.5 F (36.9 C)  TempSrc: Oral Oral  Resp: 18 18  SpO2: 98% 96%       Antony MaduraKelly Deneane Stifter, PA-C 09/09/14 0654  Lyanne CoKevin M Campos, MD 09/09/14 [phone]

## 2014-09-09 NOTE — ED Notes (Signed)
Pt has in belonging bag:  Black jacket, gray t-shirt, gray shoes, white shoes, brown wallet ( [place] ID card, EBT card, [employer] Debit card-[id number], blue jeans, sharpie, (1) five dollar bill, (4) One dollar bill, orange lighter, brown belt, baseball hat, Long sleeves shirt.

## 2014-09-09 NOTE — BH Assessment (Addendum)
Relayed results of assessment to Donell SievertSpencer Simon, PA. Per Donell SievertSpencer Simon, PA pt should be evaluated by psychiatry later in AM for final disposition.   Informed Antony MaduraKelly Humes, PA-C of plan, and she is in agreement.   Informed pt and nurse of plan.   Informed PT of plan.  Clista BernhardtNancy Zakarie Sturdivant, [facility] Triage Specialist 09/09/2014 4:51 AM

## 2014-09-09 NOTE — ED Notes (Signed)
Patient is a [age]yr old that come to ED for alcohol detox and SI to jump off bridge. Still continues to report some SI and feels like bugs are crawling on him.  Cooperative on the unit. Given neurontin and Wellbutrin this am. Wants longer term treatment.

## 2014-09-09 NOTE — ED Notes (Signed)
Pt's belongings were delivered to [facility] bagged wet and possibly soiled. Belongings were double bagged and placed in locker 42. Pt signed belonging sheet.

## 2014-09-09 NOTE — BH Assessment (Signed)
Spoke with Antony MaduraKelly Humes, PA-C prior to assessment, pt tested positive for THC, white count elevated, additional labs pending. Pt cleared to be assessed.   Assessment to begin shortly.    Clista BernhardtNancy Michah Minton, [facility] Triage Specialist 09/09/2014 4:13 AM

## 2014-09-09 NOTE — Progress Notes (Signed)
Pt accepted to [facility]. Rn to complete support paperwork. Pt to be transported voluntarily by [facility]ph[facility].   Karuna Balducci, Alexander MtLCSW [phone]  ED CSW 09/09/2014 1207pm

## 2014-09-09 NOTE — BH Assessment (Signed)
Tele Assessment Note   [name] [name] is an 38 y.o. male presenting to ED reporting SI with plan to jump off bridge, and requesting help to detox from etoh, opiates, and klonopin. Pt reports he [facility], until he relapsed about 3 months ago after separation from his wife. Pt is alert and oriented times 4, mood depressed and anxious, affect congruent, judgement impair. No AVH, no self-harm   Pt endorses depressive sx of hopelessness, isolating, decreased self-care, loss of motivation, and loss of pleasure. Reports hx of depression since teens with worsening in the past three months. Denies hx of past SI with planning, or attempts. Reports stopped taking his medications about three months ago as well.   Pt reports anxiety, no panic attacks, no hx of PTSD, or specific phobias. Reports ruminating on worries.   No hx of abuse or trauma.   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 Bipolar disorder   . Hepatitis C   . Depression   . Cellulitis    Axis IV: economic problems, housing problems, other psychosocial or environmental problems and problems with primary support group Axis V: 40-45  Past Medical History:  Past Medical History  Diagnosis Date  . Bipolar disorder   . Hepatitis C   . Depression   . Cellulitis     Past Surgical History  Procedure Laterality Date  . Back surgery    . Shoulder surgery      Family History:  Family History  Problem Relation Age of Onset  . Alcohol abuse Father   . Cancer Maternal Aunt     Social History:  reports that he has been smoking Cigarettes.  He started smoking about 32 years ago. He has been smoking about 1.00 pack per day. He has never  used smokeless tobacco. He reports that he drinks alcohol. He reports that he uses illicit drugs (Marijuana, Benzodiazepines, Other-see comments, and Methamphetamines).  Additional Social History:  Alcohol / Drug Use Pain Medications: SEE PTA Prescriptions: SEE PTA, reports she has not taken in about 3 months Over the Counter: SEE PTA History of alcohol / drug use?: Yes (Reports hx of abusing etoh, klonopin, and oxycontin, and relapsing 3 months ago after 1.5 year sober. Tested positive to THC. ) Longest period of sobriety (when/how long): 1.5 year with relapse 3 months ago. Reports hx of seizures with etoh w/d last one about a year and half ago Negative Consequences of Use: Financial, Personal relationships Withdrawal Symptoms:  (reports skin crawling, headache, hx of seizures) Substance #1 Name of Substance 1: etoh 1 - Age of First Use: 13 1 - Amount (size/oz): 12 pack, and 1/5 liqour  1 - Frequency: daily, reports last use a week ago 1 - Duration: since age 38 on and off 1.5 years sober up until 3 months ago 1 - Last Use / Amount: reports 09/08/14 around 1800 Substance #2 Name of Substance 2: oxycontin  2 - Age of First Use: 16 2 - Amount (size/oz): 80 mg 2 - Frequency: daily last three months, on/off since age 38 2 - Duration: since age 38 2 - Last Use / Amount: reports last use a week ago  Substance #3 Name  of Substance 3: klonopin  3 - Age of First Use: 16 3 - Amount (size/oz): 2 mg 3 - Frequency: daily for last three months 3 - Duration: on/off for years 3 - Last Use / Amount: about a week ago  Substance #4 Name of Substance 4: THC- pt did disclose use 4 - Age of First Use: unknown 4 - Last Use / Amount: tested positive   CIWA: CIWA-Ar BP: 122/65 mmHg Pulse Rate: 74 COWS:    PATIENT STRENGTHS: (choose at least two) Average or above average intelligence Communication skills  Allergies:  Allergies  Allergen Reactions  . Lithium Anaphylaxis  . Penicillins Rash     Home Medications:  (Not in a hospital admission)  OB/GYN Status:  No LMP for male patient.  General Assessment Data Location of Assessment: [facility] ED Is this a Tele or Face-to-Face Assessment?: Tele Assessment Is this an Initial Assessment or a Re-assessment for this encounter?: Initial Assessment Living Arrangements: Non-relatives/Friends Can pt return to current living arrangement?: Yes Admission Status: Voluntary Is patient capable of signing voluntary admission?: Yes Transfer from: Home Referral Source: Self/Family/Friend     [facility] Crisis Care Plan Living Arrangements: Non-relatives/Friends Name of Psychiatrist: Vesta Mixer not seen in three months, Dr. Jean Rosenthal Name of Therapist: none  Education Status Is patient currently in school?: No Current Grade: NA Highest grade of school patient has completed: GED Name of school: NA Contact person: NA  Risk to self with the past 6 months Suicidal Ideation: Yes-Currently Present Suicidal Intent: Yes-Currently Present Is patient at risk for suicide?: Yes Suicidal Plan?: Yes-Currently Present Specify Current Suicidal Plan: jumping off bridge, reports he ha sselceted the bridge Access to Means: Yes Specify Access to Suicidal Means: bridge What has been your use of drugs/alcohol within the last 12 months?: Pt reports hx of abusing etoh, opiates, klonopin since age 44.  Previous Attempts/Gestures: No How many times?: 0 Other Self Harm Risks: none Triggers for Past Attempts: None known Intentional Self Injurious Behavior: None Family Suicide History: No Recent stressful life event(s): Conflict (Comment) (separation from ) Persecutory voices/beliefs?: No Depression: Yes Depression Symptoms: Despondent, Insomnia, Tearfulness, Isolating, Fatigue, Guilt, Loss of interest in usual pleasures, Feeling worthless/self pity, Feeling angry/irritable Substance abuse history and/or treatment for substance abuse?: Yes Suicide prevention  information given to non-admitted patients: Yes  Risk to Others within the past 6 months Homicidal Ideation: No Thoughts of Harm to Others: No Current Homicidal Intent: No Current Homicidal Plan: No Access to Homicidal Means: No Identified Victim: none History of harm to others?: No Assessment of Violence: None Noted Violent Behavior Description: none Does patient have access to weapons?: No Criminal Charges Pending?: No Does patient have a court date: No  Psychosis Hallucinations: None noted Delusions: None noted  Mental Status Report Appear/Hygiene: Disheveled, In scrubs Eye Contact: Good Motor Activity: Other (Comment) (rubbing face, reports skin crawl) Speech: Logical/coherent Level of Consciousness: Alert Mood: Depressed Affect: Appropriate to circumstance Anxiety Level: Moderate Thought Processes: Coherent, Relevant Judgement: Impaired Orientation: Person, Place, Time, Situation Obsessive Compulsive Thoughts/Behaviors: None  Cognitive Functioning Concentration: Normal Memory: Recent Intact, Remote Intact IQ: Average Insight: Fair Impulse Control: Poor Appetite: Poor Weight Loss: 0 Weight Gain: 0 Sleep: Decreased Total Hours of Sleep:  (broken drinks until passes out, keeps waking up) Vegetative Symptoms: Decreased grooming  ADLScreening [facility]) Patient's cognitive ability adequate to safely complete daily activities?: Yes Patient able to express need for assistance with ADLs?: Yes Independently performs ADLs?: Yes (appropriate for developmental age)  Prior Inpatient Therapy Prior Inpatient Therapy: Yes Prior Therapy Dates: pt could not recall Prior Therapy Facilty/Provider(s): [name]  Reason for Treatment: SA, depression  Prior Outpatient Therapy Prior Outpatient Therapy: Yes Prior Therapy Dates: current Prior Therapy Facilty/Provider(s): [facility] Reason for Treatment: medication management  ADL Screening (condition at time  of admission) Patient's cognitive ability adequate to safely complete daily activities?: Yes Is the patient deaf or have difficulty hearing?: No Does the patient have difficulty seeing, even when wearing glasses/contacts?: No Does the patient have difficulty concentrating, remembering, or making decisions?: No Patient able to express need for assistance with ADLs?: Yes Does the patient have difficulty dressing or bathing?: No Independently performs ADLs?: Yes (appropriate for developmental age) Does the patient have difficulty walking or climbing stairs?: No Weakness of Legs: None Weakness of Arms/Hands: None  Home Assistive Devices/Equipment Home Assistive Devices/Equipment: None    Abuse/Neglect Assessment (Assessment to be complete while patient is alone) Physical Abuse: Denies Verbal Abuse: Denies Sexual Abuse: Denies Exploitation of patient/patient's resources: Denies Self-Neglect: Denies Values / Beliefs Cultural Requests During Hospitalization: None Spiritual Requests During Hospitalization: None   Advance Directives (For Healthcare) Does patient have an advance directive?: No Would patient like information on creating an advanced directive?: Yes - Educational materials given Nutrition Screen- [facility] Patient's home diet: Regular  Additional Information 1:1 In Past 12 Months?: No CIRT Risk: No Elopement Risk: No Does patient have medical clearance?: Yes     Disposition:  Per Donell SievertSpencer Simon, PA pt should be seen by psychiatry for evaluation for final disposition.  Disposition Initial Assessment Completed for this Encounter: Yes Disposition of Patient: Other dispositions  Derral Colucci M 09/09/2014 5:32 AM

## 2014-09-09 NOTE — Consult Note (Signed)
[facility] Face-to-Face Psychiatry Consult   Reason for Consult: Alcohol detox Referring Physician:  EDP  [name] [name] is an 38 y.o. male. Total Time spent with patient: 45 minutes  Assessment: AXIS I:  Alcohol Abuse, Depressive Disorder NOS, Substance Abuse and Substance Induced Mood Disorder AXIS II:  Deferred AXIS III:   Past Medical History  Diagnosis Date  . Bipolar disorder   . Hepatitis C   . Depression   . Cellulitis    AXIS IV:  housing problems, other psychosocial or environmental problems and problems related to social environment AXIS V:  11-20 some danger of hurting self or others possible OR occasionally fails to maintain minimal personal hygiene OR gross impairment in communication  Plan:  Recommend psychiatric Inpatient admission when medically cleared.  Subjective:   [name] [name] is a 38 y.o. male patient presents to [facility] requesting alcohol detox.  HPI:  Patient states that he has been off of his medication for 3 months and then started to drink.  "I've been getting sicker and sicker from the alcohol and I just want to stop and get my life back together."  Patient states that he is having suicidal thoughts to jump off of a bridge.  Denies any prior attempts. Patient also states that he has a history of seizure with alcohol withdrawal.  Patient denies homicidal ideation, psychosis, and paranoia.   HPI Elements:   Location:  Alcohol abuse. Quality:  alcohol withdrawal. Severity:  substance induced mood behavior. Timing:  1 week. Review of Systems  Neurological: Positive for seizures (related to alcohol withdrawl).  Psychiatric/Behavioral: Positive for depression, suicidal ideas and substance abuse. Negative for hallucinations and memory loss. The patient is not nervous/anxious and does not have insomnia.   All other systems reviewed and are negative.   Past Psychiatric History: Past Medical History  Diagnosis Date  . Bipolar disorder   . Hepatitis C   . Depression    . Cellulitis     reports that he has been smoking Cigarettes.  He started smoking about 32 years ago. He has been smoking about 1.00 pack per day. He has never used smokeless tobacco. He reports that he drinks alcohol. He reports that he uses illicit drugs (Marijuana, Benzodiazepines, Other-see comments, and Methamphetamines). Family History  Problem Relation Age of Onset  . Alcohol abuse Father   . Cancer Maternal Aunt    Family History Substance Abuse: No Family Supports: No Living Arrangements: Non-relatives/Friends Can pt return to current living arrangement?: Yes Abuse/Neglect [facility]) Physical Abuse: Denies Verbal Abuse: Denies Sexual Abuse: Denies Allergies:   Allergies  Allergen Reactions  . Lithium Anaphylaxis  . Penicillins Rash    ACT Assessment Complete:  Yes:    Educational Status    Risk to Self: Risk to self with the past 6 months Suicidal Ideation: Yes-Currently Present Suicidal Intent: Yes-Currently Present Is patient at risk for suicide?: Yes Suicidal Plan?: Yes-Currently Present Specify Current Suicidal Plan: jumping off bridge, reports he ha sselceted the bridge Access to Means: Yes Specify Access to Suicidal Means: bridge What has been your use of drugs/alcohol within the last 12 months?: Pt reports hx of abusing etoh, opiates, klonopin since age 87.  Previous Attempts/Gestures: No How many times?: 0 Other Self Harm Risks: none Triggers for Past Attempts: None known Intentional Self Injurious Behavior: None Family Suicide History: No Recent stressful life event(s): Conflict (Comment) (separation from ) Persecutory voices/beliefs?: No Depression: Yes Depression Symptoms: Despondent, Insomnia, Tearfulness, Isolating, Fatigue, Guilt, Loss of  interest in usual pleasures, Feeling worthless/self pity, Feeling angry/irritable Substance abuse history and/or treatment for substance abuse?: Yes Suicide prevention information given to non-admitted patients: Yes   Risk to Others: Risk to Others within the past 6 months Homicidal Ideation: No Thoughts of Harm to Others: No Current Homicidal Intent: No Current Homicidal Plan: No Access to Homicidal Means: No Identified Victim: none History of harm to others?: No Assessment of Violence: None Noted Violent Behavior Description: none Does patient have access to weapons?: No Criminal Charges Pending?: No Does patient have a court date: No  Abuse: Abuse/Neglect Assessment (Assessment to be complete while patient is alone) Physical Abuse: Denies Verbal Abuse: Denies Sexual Abuse: Denies Exploitation of patient/patient's resources: Denies Self-Neglect: Denies  Prior Inpatient Therapy: Prior Inpatient Therapy Prior Inpatient Therapy: Yes Prior Therapy Dates: pt could not recall Prior Therapy Facilty/Provider(s): [name]  Reason for Treatment: SA, depression  Prior Outpatient Therapy: Prior Outpatient Therapy Prior Outpatient Therapy: Yes Prior Therapy Dates: current Prior Therapy Facilty/Provider(s): [facility] Reason for Treatment: medication management  Additional Information: Additional Information 1:1 In Past 12 Months?: No CIRT Risk: No Elopement Risk: No Does patient have medical clearance?: Yes                  Objective: Blood pressure 122/65, pulse 74, temperature 98.5 F (36.9 C), temperature source Oral, resp. rate 18, SpO2 96 %.There is no weight on file to calculate BMI. Results for orders placed or performed during the hospital encounter of 09/09/14 (from the past 72 hour(s))  Acetaminophen level     Status: None   Collection Time: 09/09/14  3:22 AM  Result Value Ref Range   Acetaminophen (Tylenol), Serum <15.0 10 - 30 ug/mL    Comment:        THERAPEUTIC CONCENTRATIONS VARY SIGNIFICANTLY. A RANGE OF 10-30 ug/mL MAY BE AN EFFECTIVE CONCENTRATION FOR MANY PATIENTS. HOWEVER, SOME ARE BEST TREATED AT CONCENTRATIONS OUTSIDE THIS RANGE. ACETAMINOPHEN  CONCENTRATIONS >150 ug/mL AT 4 HOURS AFTER INGESTION AND >50 ug/mL AT 12 HOURS AFTER INGESTION ARE OFTEN ASSOCIATED WITH TOXIC REACTIONS.   CBC     Status: Abnormal   Collection Time: 09/09/14  3:22 AM  Result Value Ref Range   WBC 18.9 (H) 4.0 - 10.5 K/uL   RBC 5.19 4.22 - 5.81 MIL/uL   Hemoglobin 17.0 13.0 - 17.0 g/dL   HCT 48.2 39.0 - 52.0 %   MCV 92.9 78.0 - 100.0 fL   MCH 32.8 26.0 - 34.0 pg   MCHC 35.3 30.0 - 36.0 g/dL   RDW 13.6 11.5 - 15.5 %   Platelets 330 150 - 400 K/uL  Comprehensive metabolic panel     Status: Abnormal   Collection Time: 09/09/14  3:22 AM  Result Value Ref Range   Sodium 138 137 - 147 mEq/L   Potassium 3.9 3.7 - 5.3 mEq/L   Chloride 101 96 - 112 mEq/L   CO2 22 19 - 32 mEq/L   Glucose, Bld 120 (H) 70 - 99 mg/dL   BUN 12 6 - 23 mg/dL   Creatinine, Ser 0.73 0.50 - 1.35 mg/dL   Calcium 9.6 8.4 - 10.5 mg/dL   Total Protein 7.6 6.0 - 8.3 g/dL   Albumin 4.0 3.5 - 5.2 g/dL   AST 49 (H) 0 - 37 U/L   ALT 78 (H) 0 - 53 U/L   Alkaline Phosphatase 76 39 - 117 U/L   Total Bilirubin 0.7 0.3 - 1.2 mg/dL   GFR calc non  Af Amer >90 >90 mL/min   GFR calc Af Amer >90 >90 mL/min    Comment: (NOTE) The eGFR has been calculated using the CKD EPI equation. This calculation has not been validated in all clinical situations. eGFR's persistently <90 mL/min signify possible Chronic Kidney Disease.    Anion gap 15 5 - 15  Ethanol (ETOH)     Status: None   Collection Time: 09/09/14  3:22 AM  Result Value Ref Range   Alcohol, Ethyl (B) <11 0 - 11 mg/dL    Comment:        LOWEST DETECTABLE LIMIT FOR SERUM ALCOHOL IS 11 mg/dL FOR MEDICAL PURPOSES ONLY   Salicylate level     Status: Abnormal   Collection Time: 09/09/14  3:22 AM  Result Value Ref Range   Salicylate Lvl <1.6 (L) 2.8 - 20.0 mg/dL  Urine Drug Screen     Status: Abnormal   Collection Time: 09/09/14  3:27 AM  Result Value Ref Range   Opiates NONE DETECTED NONE DETECTED   Cocaine NONE DETECTED NONE  DETECTED   Benzodiazepines NONE DETECTED NONE DETECTED   Amphetamines NONE DETECTED NONE DETECTED   Tetrahydrocannabinol POSITIVE (A) NONE DETECTED   Barbiturates NONE DETECTED NONE DETECTED    Comment:        DRUG SCREEN FOR MEDICAL PURPOSES ONLY.  IF CONFIRMATION IS NEEDED FOR ANY PURPOSE, NOTIFY LAB WITHIN 5 DAYS.        LOWEST DETECTABLE LIMITS FOR URINE DRUG SCREEN Drug Class       Cutoff (ng/mL) Amphetamine      1000 Barbiturate      200 Benzodiazepine   109 Tricyclics       604 Opiates          300 Cocaine          300 THC              50    Labs are reviewed see values above.  Current Facility-Administered Medications  Medication Dose Route Frequency Provider Last Rate Last Dose  . acetaminophen (TYLENOL) tablet 650 mg  650 mg Oral Q4H PRN Antonietta Breach, PA-C      . alum & mag hydroxide-simeth (MAALOX/MYLANTA) 200-200-20 MG/5ML suspension 30 mL  30 mL Oral PRN Antonietta Breach, PA-C      . buPROPion [facility] SR) 12 hr tablet 200 mg  200 mg Oral BID Blanchie Dessert, MD   200 mg at 09/09/14 1016  . gabapentin (NEURONTIN) capsule 800 mg  800 mg Oral QID Blanchie Dessert, MD   800 mg at 09/09/14 1016  . ibuprofen (ADVIL,MOTRIN) tablet 600 mg  600 mg Oral Q8H PRN Antonietta Breach, PA-C      . LORazepam (ATIVAN) tablet 1 mg  1 mg Oral Q8H PRN Antonietta Breach, PA-C   1 mg at 09/09/14 0454  . nicotine (NICODERM CQ - dosed in mg/24 hours) patch 21 mg  21 mg Transdermal Daily Antonietta Breach, PA-C   21 mg at 09/09/14 1017  . ondansetron (ZOFRAN) tablet 4 mg  4 mg Oral Q8H PRN Antonietta Breach, PA-C      . zolpidem (AMBIEN) tablet 5 mg  5 mg Oral QHS PRN Antonietta Breach, PA-C       Current Outpatient Prescriptions  Medication Sig Dispense Refill  . buPROPion (WELLBUTRIN SR) 200 MG 12 hr tablet Take 200 mg by mouth 2 (two) times daily.    Marland Kitchen gabapentin (NEURONTIN) 800 MG tablet Take 800 mg by mouth 4 (four) times  daily.    . ibuprofen (ADVIL,MOTRIN) 800 MG tablet Take 800 mg by mouth every 8 (eight) hours  as needed for mild pain or moderate pain.    . Tapentadol HCl (NUCYNTA) 75 MG TABS Take 75 mg by mouth 4 (four) times daily.    Marland Kitchen sulfamethoxazole-trimethoprim (BACTRIM DS) 800-160 MG per tablet Take 1 tablet by mouth every 12 (twelve) hours. 12 tablet 0    Psychiatric Specialty Exam:     Blood pressure 122/65, pulse 74, temperature 98.5 F (36.9 C), temperature source Oral, resp. rate 18, SpO2 96 %.There is no weight on file to calculate BMI.  General Appearance: Casual  Eye Contact::  Good  Speech:  Clear and Coherent and Normal Rate  Volume:  Normal  Mood:  Depressed  Affect:  Congruent  Thought Process:  Circumstantial and Goal Directed  Orientation:  Full (Time, Place, and Person)  Thought Content:  Rumination  Suicidal Thoughts:  Yes.  with intent/plan  Homicidal Thoughts:  No  Memory:  Immediate;   Good Recent;   Good Remote;   Good  Judgement:  Poor  Insight:  Lacking and Shallow  Psychomotor Activity:  Normal  Concentration:  Fair  Recall:  Good  Fund of Knowledge:Fair  Language: Good  Akathisia:  No  Handed:  Right  AIMS (if indicated):     Assets:  Communication Skills Housing Social Support  Sleep:      Musculoskeletal: Strength & Muscle Tone: within normal limits Gait & Station: normal Patient leans: N/A  Treatment Plan Summary: Daily contact with patient to assess and evaluate symptoms and progress in treatment Medication management Inpatient treatment for stabilization and detox recommended  Earleen Newport FNP-BC 09/09/2014 11:36 AM  Patient seen, evaluated and I agree with notes by Nurse Practitioner. Corena Pilgrim, MD

## 2014-09-09 NOTE — ED Notes (Signed)
Pt transferred to rm 42. Report given. Vw, rn.

## 2014-09-09 NOTE — Tx Team (Signed)
Initial Interdisciplinary Treatment Plan   PATIENT STRESSORS: Substance abuse   PATIENT STRENGTHS: Average or above average intelligence Capable of independent living General fund of knowledge   PROBLEM LIST: Problem List/Patient Goals Date to be addressed Date deferred Reason deferred Estimated date of resolution                                                         DISCHARGE CRITERIA:  Improved stabilization in mood, thinking, and/or behavior Need for constant or close observation no longer present Withdrawal symptoms are absent or subacute and managed without 24-hour nursing intervention  PRELIMINARY DISCHARGE PLAN: Outpatient therapy Placement in alternative living arrangements Return to previous work or school arrangements  PATIENT/FAMIILY INVOLVEMENT: This treatment plan has been presented to and reviewed with the patient, [name] [name].  The patient and family have been given the opportunity to ask questions and make suggestions.  Leighton Parodyyson, Jillann Charette M 09/09/2014, 6:30 PM

## 2014-09-09 NOTE — Progress Notes (Signed)
Patient ID: [name], male   DOB: 09-03-1976, 38 y.o.   MRN: [id number] PER STATE REGULATIONS 482.30  THIS CHART WAS REVIEWED FOR MEDICAL NECESSITY WITH RESPECT TO THE PATIENT'S ADMISSION/DURATION OF STAY.  NEXT REVIEW DATE: 09/13/14.  Loura HaltBARBARA Ahniyah Giancola, RN, BSN CASE MANAGER

## 2014-09-10 DIAGNOSIS — F329 Major depressive disorder, single episode, unspecified: Secondary | ICD-10-CM

## 2014-09-10 DIAGNOSIS — F1124 Opioid dependence with opioid-induced mood disorder: Secondary | ICD-10-CM | POA: Insufficient documentation

## 2014-09-10 DIAGNOSIS — F1024 Alcohol dependence with alcohol-induced mood disorder: Secondary | ICD-10-CM | POA: Insufficient documentation

## 2014-09-10 DIAGNOSIS — F1199 Opioid use, unspecified with unspecified opioid-induced disorder: Secondary | ICD-10-CM

## 2014-09-10 DIAGNOSIS — F4310 Post-traumatic stress disorder, unspecified: Secondary | ICD-10-CM

## 2014-09-10 DIAGNOSIS — F1099 Alcohol use, unspecified with unspecified alcohol-induced disorder: Secondary | ICD-10-CM

## 2014-09-10 MED ORDER — CLONIDINE HCL 0.1 MG PO TABS
0.1000 mg | ORAL_TABLET | Freq: Every day | ORAL | Status: DC
  Filled 2014-09-10: qty 1

## 2014-09-10 MED ORDER — LOPERAMIDE HCL 2 MG PO CAPS: 2.0000 mg | ORAL_CAPSULE | ORAL | Status: DC | PRN

## 2014-09-10 MED ORDER — HYDROXYZINE HCL 25 MG PO TABS
25.0000 mg | ORAL_TABLET | Freq: Four times a day (QID) | ORAL | Status: DC | PRN
  Administered 2014-09-11 – 2014-09-13 (×7): 25 mg via ORAL
  Filled 2014-09-10 (×3): qty 1
  Filled 2014-09-10: qty 30
  Filled 2014-09-10 (×2): qty 1
  Filled 2014-09-10: qty 30
  Filled 2014-09-10 (×2): qty 1
  Filled 2014-09-10: qty 30

## 2014-09-10 MED ORDER — HYDROXYZINE HCL 25 MG PO TABS: 25.0000 mg | ORAL_TABLET | Freq: Four times a day (QID) | ORAL | Status: DC | PRN

## 2014-09-10 MED ORDER — LORAZEPAM 1 MG PO TABS
1.0000 mg | ORAL_TABLET | Freq: Two times a day (BID) | ORAL | Status: AC
  Administered 2014-09-12 – 2014-09-13 (×2): 1 mg via ORAL
  Filled 2014-09-10 (×2): qty 1

## 2014-09-10 MED ORDER — NAPROXEN 500 MG PO TABS
500.0000 mg | ORAL_TABLET | Freq: Two times a day (BID) | ORAL | Status: DC | PRN
  Administered 2014-09-10 (×2): 500 mg via ORAL
  Filled 2014-09-10 (×2): qty 1

## 2014-09-10 MED ORDER — SERTRALINE HCL 50 MG PO TABS
50.0000 mg | ORAL_TABLET | Freq: Every day | ORAL | Status: DC
  Administered 2014-09-11 – 2014-09-13 (×3): 50 mg via ORAL
  Filled 2014-09-10 (×4): qty 1

## 2014-09-10 MED ORDER — ONDANSETRON 4 MG PO TBDP: 4.0000 mg | ORAL_TABLET | Freq: Four times a day (QID) | ORAL | Status: DC | PRN

## 2014-09-10 MED ORDER — ADULT MULTIVITAMIN W/MINERALS CH
1.0000 | ORAL_TABLET | Freq: Every day | ORAL | Status: DC
  Administered 2014-09-11 – 2014-09-13 (×3): 1 via ORAL
  Filled 2014-09-10 (×5): qty 1

## 2014-09-10 MED ORDER — VITAMIN B-1 100 MG PO TABS
100.0000 mg | ORAL_TABLET | Freq: Every day | ORAL | Status: DC
  Administered 2014-09-11 – 2014-09-13 (×3): 100 mg via ORAL
  Filled 2014-09-10 (×5): qty 1

## 2014-09-10 MED ORDER — LORAZEPAM 1 MG PO TABS
ORAL_TABLET | ORAL | Status: AC
  Administered 2014-09-10: 1 mg via ORAL
  Filled 2014-09-10: qty 1

## 2014-09-10 MED ORDER — THIAMINE HCL 100 MG/ML IJ SOLN: 100.0000 mg | Freq: Once | INTRAMUSCULAR | Status: AC

## 2014-09-10 MED ORDER — ONDANSETRON 4 MG PO TBDP
4.0000 mg | ORAL_TABLET | Freq: Four times a day (QID) | ORAL | Status: DC | PRN
  Administered 2014-09-10 – 2014-09-13 (×6): 4 mg via ORAL
  Filled 2014-09-10 (×7): qty 1

## 2014-09-10 MED ORDER — LORAZEPAM 1 MG PO TABS
1.0000 mg | ORAL_TABLET | Freq: Four times a day (QID) | ORAL | Status: AC
  Administered 2014-09-10 – 2014-09-11 (×4): 1 mg via ORAL
  Filled 2014-09-10 (×4): qty 1

## 2014-09-10 MED ORDER — CLONIDINE HCL 0.1 MG PO TABS
0.1000 mg | ORAL_TABLET | ORAL | Status: DC
  Administered 2014-09-12 – 2014-09-13 (×3): 0.1 mg via ORAL
  Filled 2014-09-10 (×4): qty 1

## 2014-09-10 MED ORDER — LORAZEPAM 1 MG PO TABS: 1.0000 mg | ORAL_TABLET | Freq: Every day | ORAL | Status: DC

## 2014-09-10 MED ORDER — LORAZEPAM 1 MG PO TABS
1.0000 mg | ORAL_TABLET | Freq: Three times a day (TID) | ORAL | Status: AC
  Administered 2014-09-11 – 2014-09-12 (×3): 1 mg via ORAL
  Filled 2014-09-10 (×3): qty 1

## 2014-09-10 MED ORDER — DICYCLOMINE HCL 20 MG PO TABS
20.0000 mg | ORAL_TABLET | Freq: Four times a day (QID) | ORAL | Status: DC | PRN
  Administered 2014-09-11 – 2014-09-12 (×2): 20 mg via ORAL
  Filled 2014-09-10 (×2): qty 1

## 2014-09-10 MED ORDER — METHOCARBAMOL 500 MG PO TABS
500.0000 mg | ORAL_TABLET | Freq: Three times a day (TID) | ORAL | Status: DC | PRN
  Administered 2014-09-10 – 2014-09-13 (×8): 500 mg via ORAL
  Filled 2014-09-10 (×2): qty 1
  Filled 2014-09-10 (×2): qty 42
  Filled 2014-09-10 (×2): qty 1
  Filled 2014-09-10: qty 42
  Filled 2014-09-10 (×4): qty 1

## 2014-09-10 MED ORDER — LORAZEPAM 1 MG PO TABS
1.0000 mg | ORAL_TABLET | Freq: Four times a day (QID) | ORAL | Status: AC | PRN
  Administered 2014-09-10 – 2014-09-13 (×7): 1 mg via ORAL
  Filled 2014-09-10 (×7): qty 1

## 2014-09-10 MED ORDER — CLONIDINE HCL 0.1 MG PO TABS
0.1000 mg | ORAL_TABLET | Freq: Four times a day (QID) | ORAL | Status: AC
  Administered 2014-09-10 – 2014-09-12 (×7): 0.1 mg via ORAL
  Filled 2014-09-10 (×8): qty 1

## 2014-09-10 NOTE — Tx Team (Signed)
Interdisciplinary Treatment Plan Update   Date Reviewed:  09/10/2014  Time Reviewed:  8:42 AM  Progress in Treatment:   Attending groups: Yes Participating in groups: Yes Taking medication as prescribed: Yes  Tolerating medication: Yes Family/Significant other contact made:  No, but will ask patient for consent for collateral contact Patient understands diagnosis: Yes  Discussing patient identified problems/goals with staff: Yes Medical problems stabilized or resolved: Yes Denies suicidal/homicidal ideation: Yes Patient has not harmed self or others: Yes  For review of initial/current patient goals, please see plan of care.  Estimated Length of Stay:  3-5 days  Reasons for Continued Hospitalization:  Anxiety Depression Medication stabilization  New Problems/Goals identified:    Discharge Plan or Barriers:   Home with outpatient follow up to be determined  Additional Comments:   [name] PennerGary M Dowell is an 38 y.o. male presenting to ED reporting SI with plan to jump off bridge, and requesting help to detox from etoh, opiates, and klonopin. Pt reports he was sober for a [facility] and AA, until he relapsed about 3 months ago after separation from his wife. Pt is alert and oriented times 4, mood depressed and anxious, affect congruent, judgement impair. No AVH, no self-harm.  Pt endorses depressive sx of hopelessness, isolating, decreased self-care, loss of motivation, and loss of pleasure. Reports hx of depression since teens with worsening in the past three months. Denies hx of past SI with planning, or attempts. Reports stopped taking his medications about three months ago as well. Pt reports anxiety, no panic attacks, no hx of PTSD, or specific phobias. Reports ruminating on worries.    Patient and CSW will meet to identify goals and treatment plan.    Attendees:  Patient:  09/10/2014 8:42 AM   Signature:  Sallyanne HaversF. Cobos, MD 09/10/2014 8:42 AM  Signature: I 09/10/2014 8:42 AM   Signature:  Harold Barbanonecia Byrd, RN 09/10/2014 8:42 AM  Signature:Beverly Terrilee CroakKnight, RN 09/10/2014 8:42 AM  Signature:   09/10/2014 8:42 AM  Signature:  Juline PatchQuylle Joshawa Dubin, LCSW 09/10/2014 8:42 AM  Signature:  Belenda CruiseKristin Drinkard, LCSW-A 09/10/2014 8:42 AM  Signature:  Leisa LenzValerie Enoch, Care Coordinator [facility] 09/10/2014 8:42 AM  Signature:   09/10/2014 8:42 AM  Signature: Michaelle BirksBritney Guthrie, RN 09/10/2014  8:42 AM  Signature:    09/10/2014  8:42 AM  Signature:  09/10/2014  8:42 AM    Scribe for Treatment Team:   Juline PatchQuylle Robinette Esters,  09/10/2014 8:42 AM

## 2014-09-10 NOTE — [facility] Group Notes (Signed)
[facility] Group Notes:  activities  Date:  09/10/2014  Time:  11:02 AM  Type of Therapy:  Psychoeducational Skills  Participation Level:  Active  Participation Quality:  Appropriate  Affect:  Appropriate  Cognitive:  Alert  Insight:  Appropriate  Engagement in Group:  Engaged  Modes of Intervention:  Discussion  Summary of Progress/Problems:  Nicole CellaWebb, Nicoya Friel Guyes 09/10/2014, 11:02 AM

## 2014-09-10 NOTE — Progress Notes (Signed)
D: Pt endorses having anxiety and agitation this evening. Pt observed pacing the halls at the beginning of the shift. Pt reports minimal relief from his previous dose of prn vistaril for anxiety. Pt requested Zofran for nausea. Pt informed that 0100 was the next available administration time for his prn Zofran. Pt declined writer's offer for any ginger ale. Pt's anxiety was remarkably reduced after receiving a one time dose of Ativan 2 mg by mouth. Pt was calm and cooperative with his plan of care. Pt is negative for any SI/HI/AVH at this time.  A: Writer administered scheduled and prn medications to pt, per MD orders. Continued support and availability as needed was extended to this pt. Staff continue to monitor pt with q6115min checks.  R: No adverse drug reactions noted. Pt receptive to treatment. Pt remains safe at this time.

## 2014-09-10 NOTE — [facility] Group Notes (Signed)
[facility]   09/10/2014 8:47 AM    Participation Quality:  Appropraite  Mood/Affect:  Appropriate  Depression Rating:  10  Anxiety Rating:  7  Thoughts of Suicide:  No  Will you contract for safety?   NA  Current AVH:  No  Plan for Discharge/Comments:  Patient attended discharge planning group and actively participated in group. He [facility]  Suicide prevention education reviewed and SPE document provided.   Transportation Means: Patient uses public transportation.   Supports:  Patient has a limited support system.   Jazlynn Nemetz, Joesph JulyQuylle Hairston

## 2014-09-10 NOTE — [facility] Suicide Risk Assessment (Signed)
   Nursing information obtained from:  Patient Demographic factors:  Male, Adolescent or young adult, Caucasian, Low socioeconomic status, Living alone Current Mental Status:  Self-harm thoughts, Plan to harm others Loss Factors:  Decline in physical health, Financial problems / change in socioeconomic status Historical Factors:  Prior suicide attempts, Family history of mental illness or substance abuse Risk Reduction Factors:  Sense of responsibility to family, Employed Total Time spent with patient: 45 minutes  CLINICAL FACTORS:  Alcohol/Opiate Dependencies- Alcohol Withdrawal  Psychiatric Specialty Exam: Physical Exam  ROS  Blood pressure 128/80, pulse 80, temperature 98.2 F (36.8 C), temperature source Oral, resp. rate 18, SpO2 100 %.There is no weight on file to calculate BMI.  General Appearance: Disheveled  Eye Contact::  Good  Speech:  Slow  Volume:  Normal  Mood:  Depressed and dysphoric  Affect:  Labile  Thought Process:  Linear  Orientation:  Other:  fully alert and attentive\  Thought Content:  denies hallucinations, no delusions  Suicidal Thoughts:  No at this time denies any thoughts of hurting self and  contracts for safety on unit   Homicidal Thoughts:  No  Memory: recent and remote fair  Judgement:  Fair  Insight:  Fair  Psychomotor Activity:  Increased and Restlessness  Concentration:  Fair  Recall:  Good  Fund of Knowledge:Good  Language: Good  Akathisia:  Negative  Handed:  Right  AIMS (if indicated):     Assets:  Desire for Improvement Resilience  Sleep:  Number of Hours: 6.75   Musculoskeletal: Strength & Muscle Tone: within normal limits- patient is only slightly tremulous, but does seem flushed, restless, agitated, and diaphoretic. Gait & Station: normal Patient leans: N/A  COGNITIVE FEATURES THAT CONTRIBUTE TO RISK:  Closed-mindedness    SUICIDE RISK:   Moderate:  Frequent suicidal ideation with limited intensity, and duration, some  specificity in terms of plans, no associated intent, good self-control, limited dysphoria/symptomatology, some risk factors present, and identifiable protective factors, including available and accessible social support.  PLAN OF CARE: Patient will be admitted to inpatient psychiatric unit for stabilization and safety. Will provide and encourage milieu participation. Provide medication management and maked adjustments as needed. Will also provide medication management to minimize risk of withdrawal.  Will follow daily.    I certify that inpatient services furnished can reasonably be expected to improve the patient's condition.  Carmelina Balducci 09/10/2014, 2:52 PM
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
## 2014-09-10 NOTE — Progress Notes (Signed)
D: Patient has anxious affect and mood. He reported feeling agitated and not doing well. Patient declined completing the daily self inventory sheet today. He complained of withdrawals, anxiety, nausea and back pain. Patient has attended some, but not all scheduled groups. Writer observed that the patient has been interacting with peers in the dayroom and anytime outside of groups he's pacing up and down the hall requesting to speak with the doctor and nurse practitioner. RN informed MD, Cobos that the patient was experiencing a lot of pain and would like to know if he could possibly be ordered Nucynta. Patient adheres to current medication regimen.  A: Support and encouragement provided to patient. Administered scheduled medications per ordering MD. PRN Librium given for withdrawals, Vistaril/Ativan for anxiety, Zofran-nausea and Naproxen-pain. Monitor Q15 minute checks for safety.  R: Patient receptive. Denies SI/HI and AVH. Patient remains safe on the unit.

## 2014-09-10 NOTE — [facility] Group Notes (Signed)
Adult Psychoeducational Group Note  Date:  09/10/2014 Time:  10:03 PM  Group Topic/Focus:  AA Meeting  Participation Level:  Active  Participation Quality:  Appropriate  Affect:  Appropriate  Cognitive:  Appropriate  Insight: Good  Engagement in Group:  Engaged  Modes of Intervention:  Discussion and Education  Additional Comments:  Jillyn HiddenGary was very detailed and engaged when talking about his situation in group.  Caroll RancherLindsay, Andrienne Havener A 09/10/2014, 10:03 PM

## 2014-09-10 NOTE — Clinical Social Work Note (Signed)
[facility]  Message left on Jeff's voicemail advising patient was in treatment there two years ago and had been sober until August of this year.  Awaiting call back.

## 2014-09-11 DIAGNOSIS — F332 Major depressive disorder, recurrent severe without psychotic features: Principal | ICD-10-CM

## 2014-09-11 DIAGNOSIS — F1910 Other psychoactive substance abuse, uncomplicated: Secondary | ICD-10-CM

## 2014-09-11 DIAGNOSIS — R45851 Suicidal ideations: Secondary | ICD-10-CM

## 2014-09-11 LAB — CBC WITH DIFFERENTIAL/PLATELET
BASOS ABS: 0 10*3/uL (ref 0.0–0.1)
BASOS PCT: 0 % (ref 0–1)
EOS ABS: 0.5 10*3/uL (ref 0.0–0.7)
EOS PCT: 5 % (ref 0–5)
HEMATOCRIT: 46.9 % (ref 39.0–52.0)
Hemoglobin: 16 g/dL (ref 13.0–17.0)
LYMPHS PCT: 35 % (ref 12–46)
Lymphs Abs: 4.1 10*3/uL — ABNORMAL HIGH (ref 0.7–4.0)
MCH: 32.4 pg (ref 26.0–34.0)
MCHC: 34.1 g/dL (ref 30.0–36.0)
MCV: 94.9 fL (ref 78.0–100.0)
MONO ABS: 1 10*3/uL (ref 0.1–1.0)
Monocytes Relative: 9 % (ref 3–12)
Neutro Abs: 6 10*3/uL (ref 1.7–7.7)
Neutrophils Relative %: 51 % (ref 43–77)
PLATELETS: 249 10*3/uL (ref 150–400)
RBC: 4.94 MIL/uL (ref 4.22–5.81)
RDW: 13.4 % (ref 11.5–15.5)
WBC: 11.7 10*3/uL — ABNORMAL HIGH (ref 4.0–10.5)

## 2014-09-11 MED ORDER — BUPROPION HCL 100 MG PO TABS
200.0000 mg | ORAL_TABLET | Freq: Two times a day (BID) | ORAL | Status: DC
  Administered 2014-09-11 – 2014-09-13 (×4): 200 mg via ORAL
  Filled 2014-09-11 (×9): qty 2

## 2014-09-11 MED ORDER — IBUPROFEN 100 MG/5ML PO SUSP: 800.0000 mg | Freq: Three times a day (TID) | ORAL | Status: DC

## 2014-09-11 MED ORDER — IBUPROFEN 800 MG PO TABS
800.0000 mg | ORAL_TABLET | Freq: Three times a day (TID) | ORAL | Status: DC
  Administered 2014-09-11 – 2014-09-14 (×9): 800 mg via ORAL
  Filled 2014-09-11 (×15): qty 1

## 2014-09-11 NOTE — Progress Notes (Signed)
Patient ID: [name] PennerGary M [name], male   DOB: [birth year], 38 y.o.   MRN: [id number] D)  Has been coming to the nursing station frequently tonight, upset over a business card he states he needs , needs to call about a job, feeling anxious and upset.  Was medicated for anxiety, explained would help him and get the card but would have to wait d/t admissions.  Remained upset, pacing in the hall, didn't attend group, but later apologized for his behavior, [facility] and helped him obtain cards.Had questions about detox long term, and about pain meds for his back.  Stated if he had to work and not have his pain meds, he would start drinking again.  Apologized again, stated was trying to be compliant, but stressed over work, situation. A)  Will continue to monitor for safety, support, continue POC R)  Safety maintained.

## 2014-09-11 NOTE — Progress Notes (Signed)
Adult Psychoeducational Group Note  Date:  09/11/2014 Time:  2:22 PM  Group Topic/Focus:  Therapeutic Activity   Participation Level:  Did Not Attend  Elijio MilesMercer, Biridiana Twardowski N 09/11/2014, 2:22 PM
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
## 2014-09-11 NOTE — [facility] Group Notes (Signed)
[facility] LCSW Group Therapy  09/11/2014 12:46 PM  Type of Therapy:  Group Therapy  Participation Level:  Minimal  Participation Quality:  Appropriate  Affect:  Appropriate  Cognitive:  Appropriate  Insight:  Limited  Engagement in Therapy:  Lacking  Modes of Intervention:  Discussion  Summary of Progress/Problems: Patient participated in group today during which the discussion was about coping strategies. In group we discussed what are negative coping strategies and how we have developed them over the years. Then processed examples of positive coping mechanisms.  The group then processed how to use positive attributes in order to develop their copingmechanisms. Group proceded through discussion and open dialogue.   [name], Kacelyn Rowzee J 09/11/2014, 12:46 PM

## 2014-09-11 NOTE — Progress Notes (Addendum)
Patient ID: [name] PennerGary M Boudreau, male   DOB: 13-Sep-1976, 38 y.o.   MRN: [id number]   D: Pt has been very depressed, irritable, agitated, and anxious on the unit today. Pt reported severe withdrawal symptoms and has required several as needed medications. Pt was in the bed most of the day, and reported that he just needed to sleep it off. Pt reported his depression as a 10, and his helplessness/hopelessness as a 10.  Pt reported being negative SI/HI, no AH/VH noted. A: 15 min checks continued for patient safety. R: Pt safety maintained.

## 2014-09-12 DIAGNOSIS — R45851 Suicidal ideations: Secondary | ICD-10-CM

## 2014-09-12 DIAGNOSIS — F332 Major depressive disorder, recurrent severe without psychotic features: Secondary | ICD-10-CM

## 2014-09-12 DIAGNOSIS — F1910 Other psychoactive substance abuse, uncomplicated: Secondary | ICD-10-CM

## 2014-09-12 NOTE — Progress Notes (Signed)
Patient ID: [name] [name], male   DOB: 08/14/1976, 38 y.o.   MRN: [id number]  DAR: Pt. 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 Patient reports frequent withdrawal symptoms and continues to be medication seeking. Patient reports constant pain and receives PRN and scheduled medication to decrease pain level however patient reports substantial pain. Patient was asked about reassessment on pain medication and patient reported, "a ten!" Writer inquired that patient rated lower on pain BEFORE this medication was given. Patient then stated, "well then an eight or one below what I said." Patient is restless and paces frequently. Patient is frequently at the medication door/nurses station requesting medications. Patient rates his depression and anxiety 10/10 for the day. Patient reports hopelessness 8/10 for the day. Support and encouragement provided to the patient. Patient has been redirected frequently during the day. Scheduled medications administered to patient per physician's orders. Patient is receptive and cooperative but can be labile at times. Q15 minute checks are maintained for safety.

## 2014-09-12 NOTE — Progress Notes (Signed)
[facility]  09/12/2014 10:24 AM [name] PennerGary M [name]  MRN:  [id number] Subjective:    -agitated and irriated, angers easily when discussing his chronic pain issues  Rates depression 10/10 and Anxiety 10/10  "I need more for my pain"  D[facility]ed [facility] and his goal to transition to inpatient rehab  facility from here and he reinforces "I want to be here".  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 Bipolar disorder   . Hepatitis C   . Depression   . Cellulitis    Axis IV: economic problems, educational problems, problems related to legal system/crime, problems related to social environment and problems with primary support group Axis V: 31-40 impairment in reality testing  ADL's:  Impaired  Sleep: Poor  Appetite:  Fair  Suicidal Ideation:  Denies presently "becaiuse I'm in her, let me out and I'' kill myself" Homicidal Ideation:  -denies AEB (as evidenced by):  Psychiatric Specialty Exam: Physical Exam  Constitutional: He is oriented to person, place, and time. He appears well-developed and well-nourished.  HENT:  Head: Normocephalic and atraumatic.  Neck: Normal range of motion. Neck supple.  Musculoskeletal: Normal range of motion.  Neurological: He is alert and oriented to person, place, and time.  Skin: Skin is warm and dry.    ROS  Blood pressure 121/61, pulse 60, temperature  98.7 F (37.1 C), temperature source Oral, resp. rate 20, SpO2 100 %.There is no weight on file to calculate BMI.  General Appearance: Disheveled  Eye SolicitorContact::  Fair  Speech:  Pressured  Volume:  Normal  Mood:  Angry, Anxious, Depressed and Irritable  Affect:  Constricted and Depressed  Thought Process:  Goal Directed  Orientation:  Full (Time, Place, and Person)  Thought Content:  Negative  Suicidal Thoughts:  No  Homicidal Thoughts:  No  Memory:  Immediate;   Fair Recent;   Fair Remote;   Fair  Judgement:  Poor  Insight:  Lacking  Psychomotor Activity:  Decreased  Concentration:  Fair  Recall:  FiservFair  Fund of Knowledge:Fair  Language: Fair  Akathisia:  NA  Handed:  Right  AIMS (if indicated):     Assets:  Resilience  Sleep:  Number of Hours: 6.25   Musculoskeletal: Strength & Muscle Tone: within normal limits Gait & Station: normal Patient leans: N/A  Current Medications: Current Facility-Administered Medications  Medication Dose Route Frequency Provider Last Rate Last Dose  . alum & mag hydroxide-simeth (MAALOX/MYLANTA) 200-200-20 MG/5ML suspension 30 mL  30 mL Oral PRN Shuvon Rankin, NP      . buPROPion (WELLBUTRIN) tablet 200 mg  200 mg Oral BID Canary BrimMary W Larach, NP   200 mg at 09/12/14 0820  . cloNIDine (CATAPRES) tablet 0.1 mg  0.1 mg Oral BH-qamhs Craige CottaFernando A Cobos, MD       Followed by  . [START ON 09/15/2014] cloNIDine (CATAPRES) tablet 0.1 mg  0.1 mg Oral QAC breakfast Craige CottaFernando A Cobos, MD      . dicyclomine (  BENTYL) tablet 20 mg  20 mg Oral Q6H PRN Craige CottaFernando A Cobos, MD   20 mg at 09/12/14 0604  . gabapentin (NEURONTIN) capsule 800 mg  800 mg Oral QID Shuvon Rankin, NP   800 mg at 09/12/14 0817  . hydrOXYzine (ATARAX/VISTARIL) tablet 25 mg  25 mg Oral Q6H PRN Craige CottaFernando A Cobos, MD   25 mg at 09/12/14 0957  . ibuprofen (ADVIL,MOTRIN) tablet 800 mg  800 mg Oral 3 times per day Craige CottaFernando A Cobos, MD   800 mg at 09/12/14 0645  . loperamide (IMODIUM) capsule 2-4 mg  2-4  mg Oral PRN Craige CottaFernando A Cobos, MD      . LORazepam (ATIVAN) tablet 1 mg  1 mg Oral Q6H PRN Craige CottaFernando A Cobos, MD   1 mg at 09/12/14 0604  . LORazepam (ATIVAN) tablet 1 mg  1 mg Oral TID Craige CottaFernando A Cobos, MD   1 mg at 09/12/14 0818   Followed by  . LORazepam (ATIVAN) tablet 1 mg  1 mg Oral BID Craige CottaFernando A Cobos, MD       Followed by  . [START ON 09/14/2014] LORazepam (ATIVAN) tablet 1 mg  1 mg Oral Daily Fernando A Cobos, MD      . magnesium hydroxide (MILK OF MAGNESIA) suspension 30 mL  30 mL Oral Daily PRN Shuvon Rankin, NP      . methocarbamol (ROBAXIN) tablet 500 mg  500 mg Oral Q8H PRN Craige CottaFernando A Cobos, MD   500 mg at 09/12/14 0605  . multivitamin with minerals tablet 1 tablet  1 tablet Oral Daily Craige CottaFernando A Cobos, MD   1 tablet at 09/12/14 0818  . nicotine (NICODERM CQ - dosed in mg/24 hours) patch 21 mg  21 mg Transdermal Daily Shuvon Rankin, NP   21 mg at 09/12/14 0615  . ondansetron (ZOFRAN-ODT) disintegrating tablet 4 mg  4 mg Oral Q6H PRN Craige CottaFernando A Cobos, MD   4 mg at 09/12/14 0818  . sertraline (ZOLOFT) tablet 50 mg  50 mg Oral Daily Craige CottaFernando A Cobos, MD   50 mg at 09/12/14 0818  . thiamine (VITAMIN B-1) tablet 100 mg  100 mg Oral Daily Craige CottaFernando A Cobos, MD   100 mg at 09/12/14 0817  . traZODone (DESYREL) tablet 50 mg  50 mg Oral QHS,MR X 1 Saramma Eappen, MD   50 mg at 09/11/14 2354    Lab Results:  Results for orders placed or performed during the hospital encounter of 09/09/14 (from the past 48 hour(s))  CBC with Differential     Status: Abnormal   Collection Time: 09/11/14  6:53 AM  Result Value Ref Range   WBC 11.7 (H) 4.0 - 10.5 K/uL   RBC 4.94 4.22 - 5.81 MIL/uL   Hemoglobin 16.0 13.0 - 17.0 g/dL   HCT 16.146.9 09.639.0 - 04.552.0 %   MCV 94.9 78.0 - 100.0 fL   MCH 32.4 26.0 - 34.0 pg   MCHC 34.1 30.0 - 36.0 g/dL   RDW 40.913.4 81.111.5 - 91.415.5 %   Platelets 249 150 - 400 K/uL   Neutrophils Relative % 51 43 - 77 %   Neutro Abs 6.0 1.7 - 7.7 K/uL   Lymphocytes Relative 35 12 - 46 %    Lymphs Abs 4.1 (H) 0.7 - 4.0 K/uL   Monocytes Relative 9 3 - 12 %   Monocytes Absolute 1.0 0.1 - 1.0 K/uL   Eosinophils Relative 5 0 - 5 %   Eosinophils Absolute 0.5 0.0 - 0.7  K/uL   Basophils Relative 0 0 - 1 %   Basophils Absolute 0.0 0.0 - 0.1 K/uL    Comment: Performed at [facility]    Physical Findings: AIMS:  , ,  ,  ,    CIWA:  CIWA-Ar Total: 14 COWS:  COWS Total Score: 11  Treatment Plan Summary:  Daily contact with patient to assess and evaluate symptoms and progress in treatment Medication management  Chaplain consulted Patient is calm and cooperative throughout the afternoon.  We discussed various coping strategies for stress and anxiety; deep breathing, exercise, journal ing and talk therpay  Plan:  Medical Decision Making Problem Points:  Established problem, stable/improving (1) and Review of psycho-social stressors (1) Data Points:  Review of medication regiment & side effects (2) Review of new medications or change in dosage (2)     I certify that inpatient services furnished can reasonably be expected to improve the patient's condition.   Lorinda Creed  PMHNP 09/12/2014, 10:24 AM

## 2014-09-12 NOTE — [facility] Group Notes (Signed)
[facility]  09/12/2014 3:30 PM  Type of Therapy:  Group Therapy  Participation Level:  Did Not Attend   [name], [name] Hentz J 09/12/2014, 3:30 PM

## 2014-09-12 NOTE — Progress Notes (Signed)
Pt attended NA group this evening.  

## 2014-09-12 NOTE — Plan of Care (Signed)
Problem: Alteration in mood & ability to function due to Goal: STG-Patient will report withdrawal symptoms Outcome: Progressing Patient is adamant about speaking with Clinical research associatewriter and staff about his withdrawal symptoms. Patient is able to report withdrawal symptoms.

## 2014-09-12 NOTE — Progress Notes (Signed)
Patient ID: Noelle PennerGary M Loveday, male   DOB: 04-17-1976, 38 y.o.   MRN: [id number]  [facility]:  (Nursing/MHT/Case Management/Adjunct)  Date:  09/12/2014  Time:  3:29 PM  Type of Therapy:  Nurse Education  Participation Level:  Did Not Attend  Participation Quality:  Did not attend  Affect:  Did not attend  Cognitive:  Did not attend  Insight:  None  Engagement in Group:  None  Modes of Intervention:  Discussion and Education  Summary of Progress/Problems: The purpose of this group is to follow up on earlier expressed concerns and rules. Patient was invited but did not attend.

## 2014-09-12 NOTE — Progress Notes (Signed)
Patient ID: [name] PennerGary M [name], male   DOB: [birth year], 38 y.o.   MRN: [id number] D)  Has been out and about on the hall frequently this evening, pacing and checking with staff frequently asking if he has any meds he can have.  Initially attended group but came out of group because he didn't feel like sitting thru group, asking for gatorade, asking again about meds, states having a hard time with detox, irritable, restless, cursing at times, then apologizing.  Was given gatorade and gingerale, medicated when group over, asking for whatever he could have.  Was calmer after medicated and was appreciative.  Also given extra pillow and heat packs for neck, states has bone spurs.   A)  Will continue to monitor for safety, continue POC R)  Safety maintained.

## 2014-09-12 NOTE — B[facility] Group Notes (Signed)
[facility] Group Notes:  (Nursing/MHT/Case Management/Adjunct)  Date:  09/12/2014  Time:  3:18 PM  Type of Therapy:  Nurse Education  Participation Level:  Minimal  Participation Quality:  Appropriate  Affect:  Appropriate  Cognitive:  Appropriate  Insight:  Lacking  Engagement in Group:  Lacking  Modes of Intervention:  Discussion and Education  Summary of Progress/Problems: The purpose of this group is to discuss rules and regulations while on the unit. Patients discuss the treatment agreement and group is open for questions or concerns. Patient attended group and was engaged but did not voice any questions or concerns.  Marzetta BoardDopson, Zander Ingham E 09/12/2014, 3:18 PM

## 2014-09-13 MED ORDER — TRAZODONE HCL 50 MG PO TABS: 50.0000 mg | ORAL_TABLET | Freq: Every evening | ORAL | Status: DC | PRN

## 2014-09-13 MED ORDER — SERTRALINE HCL 100 MG PO TABS
100.0000 mg | ORAL_TABLET | Freq: Every day | ORAL | Status: DC
  Administered 2014-09-14: 100 mg via ORAL
  Filled 2014-09-13 (×2): qty 1
  Filled 2014-09-13: qty 14

## 2014-09-13 MED ORDER — METHOCARBAMOL 500 MG PO TABS: 500.0000 mg | ORAL_TABLET | Freq: Three times a day (TID) | ORAL | Status: DC | PRN

## 2014-09-13 MED ORDER — BUPROPION HCL ER (XL) 300 MG PO TB24: 300.0000 mg | ORAL_TABLET | Freq: Every day | ORAL | Status: DC

## 2014-09-13 MED ORDER — LIDOCAINE 5 % EX PTCH: 1.0000 | MEDICATED_PATCH | CUTANEOUS | Status: DC

## 2014-09-13 MED ORDER — GABAPENTIN 400 MG PO CAPS: 800.0000 mg | ORAL_CAPSULE | Freq: Four times a day (QID) | ORAL | Status: DC

## 2014-09-13 MED ORDER — LIDOCAINE 5 % EX PTCH
1.0000 | MEDICATED_PATCH | CUTANEOUS | Status: DC
  Administered 2014-09-13: 1 via TRANSDERMAL
  Filled 2014-09-13 (×3): qty 1
  Filled 2014-09-13: qty 14

## 2014-09-13 MED ORDER — BUPROPION HCL ER (XL) 300 MG PO TB24
300.0000 mg | ORAL_TABLET | Freq: Every day | ORAL | Status: DC
  Administered 2014-09-14: 300 mg via ORAL
  Filled 2014-09-13: qty 1
  Filled 2014-09-13: qty 14
  Filled 2014-09-13: qty 1

## 2014-09-13 MED ORDER — SERTRALINE HCL 100 MG PO TABS: 100.0000 mg | ORAL_TABLET | Freq: Every day | ORAL | Status: DC

## 2014-09-13 MED ORDER — HYDROXYZINE HCL 25 MG PO TABS: 25.0000 mg | ORAL_TABLET | Freq: Four times a day (QID) | ORAL | Status: DC | PRN

## 2014-09-13 NOTE — BHH Suicide Risk Assessment (Signed)
Suicide Risk Assessment  Discharge Assessment     Demographic Factors:  Male and Caucasian  Total Time spent with patient: 30 minutes  Psychiatric Specialty Exam:     Blood pressure 129/106, pulse 97, temperature 97.3 F (36.3 C), temperature source Oral, resp. rate 16, SpO2 100 %.There is no weight on file to calculate BMI.  General Appearance: Fairly Groomed  Patent attorneyye Contact::  Fair  Speech:  Clear and Coherent  Volume:  Normal  Mood:  Anxious and worried  Affect:  anxious, worried  Thought Process:  Coherent and Goal Directed  Orientation:  Full (Time, Place, and Person)  Thought Content:  symptoms worries concerns   Suicidal Thoughts:  No  Homicidal Thoughts:  No  Memory:  Immediate;   Fair Recent;   Fair Remote;   Fair  Judgement:  Fair  Insight:  Present and Shallow  Psychomotor Activity:  Restlessness  Concentration:  Fair  Recall:  FiservFair  Fund of Knowledge:NA  Language: Fair  Akathisia:  No  Handed:    AIMS (if indicated):     Assets:  Desire for Improvement  Sleep:  Number of Hours: 6.25    Musculoskeletal: Strength & Muscle Tone: within normal limits Gait & Station: normal Patient leans: N/A   Mental Status Per Nursing Assessment::   On Admission:  Self-harm thoughts, Plan to harm others  Current Mental Status by Physician: In full contact with reality. There are no active S/S of withdrawal. No active SI plans or intent. His mood is anxious, worried, not sure how he is going to handle his pain. He is still wanting to be on Suboxone   Loss Factors: NA  Historical Factors: NA  Risk Reduction Factors:   Sense of responsibility to family  Continued Clinical Symptoms:  Severe Anxiety and/or Agitation Alcohol/Substance Abuse/Dependencies  Cognitive Features That Contribute To Risk:  Closed-mindedness Polarized thinking Thought constriction (tunnel vision)    Suicide Risk:  Minimal: No identifiable suicidal ideation.  Patients presenting with no  risk factors but with morbid ruminations; may be classified as minimal risk based on the severity of the depressive symptoms  Discharge Diagnoses:   AXIS I:  Alcohol Dependence, Opioid Dependence, GAD, MDD AXIS II:  No diagnosis AXIS III:   Past Medical History  Diagnosis Date  . Bipolar disorder   . Hepatitis C   . Depression   . Cellulitis    AXIS IV:  other psychosocial or environmental problems AXIS V:  61-70 mild symptoms  Plan Of Care/Follow-up recommendations:  Activity:  as tolerated Diet:  regular Follow up [name] Is patient on multiple antipsychotic therapies at discharge:  No   Has Patient had three or more failed trials of antipsychotic monotherapy by history:  No  Recommended Plan for Multiple Antipsychotic Therapies: NA    Olivine Hiers A 09/13/2014, 5:40 PM

## 2014-09-13 NOTE — Progress Notes (Signed)
Patient ID: [name] PennerGary M Morera, male   DOB: 22-Jul-1976, 38 y.o.   MRN: [id number]  D: Pt. Denies SI/HI and A/V Hallucinations to this Clinical research associatewriter during assessment. Patient does report pain 8/10 in his back. Patient is receiving PRN and scheduled medications to decrease pain level. Patient rates depression, anxiety, and hopelessness at 8/10 for the day. Patient reports sleeping poorly last night, appetite is good, and concentration level is normal. Patient rates his energy level as hyper. Patient is frequently seen pacing in the hallway and fidgety and restless when at the medication window.  A: Support and encouragement provided to the patient to continue with plan of care and come to writer with questions or concerns. Scheduled medications are administered per physician's orders.  R: Patient is fixated on medications and preoccupied with what, when, and how much medication he can get. Patient is frequently seen at the medication window and pacing the hall. Patient is attending some groups. Q15 minute checks are maintained for safety.

## 2014-09-13 NOTE — Progress Notes (Signed)
Addendum  Treatment Plan/Recommendations: Treatment Plan Summary: Review of chart, vital signs, medications and notes Daily contact with the patient to assess and evaluate synmptoms and progress in treatment  Plan: 1. Continue crisis management and stabilization. Estimated length of stay 5-7 days  2. Medication management to reduce current symptoms to base line and improve patient's overall level of functioning  -restart home medications of : Wellbutrin 200 mg po bid          Motrin 800 mg po every 8 hrs prn Continued conversation regarding the importance of detox from all opioids and alcohol for success treatment plan and option for in patient facilities.  We discussed coping strategies of deep breathing, exercise and talk therapy.  All medications reviewed with the pateint and no untoward effects Individual and group therapy encouraged Coping skills for depression, substance abuse, and anxiety  3. Treat health problems as indicated. 4. Develop treatment plan to decrease risk of relapse upon discharge and the need for readmission 5. Psych-social education regarding relapse prevention and self care. 6. Health care follow up as needed for medical problems 7. Continue home medications where appropriate 8. Disposition in progress  -Hopeful for inpatient facility   Lorinda CreedMary Tyona Nilsen PMHNP

## 2014-09-13 NOTE — Progress Notes (Signed)
Patient ID: [name] [name], male   DOB: Aug 15, 1976, 38 y.o.   MRN: [id number]  Spoke with [name] LenzQuylle LCSW, about patient's discharge, this morning. [name] [name] reported that patient is to go early in the morning and writer verbalized that night shift would be notified. Writer realized later during shift that SRA and AVS were not completed on MD and NP end. Writer called Dr. Jama [name] who verbalized that patient can discharge and NP [name] [name] was made aware in which she verified patient's medications and prescriptions were printed. [name] HookAnn LCSW was contacted and Clinical research associatewriter spoke with her to verify that patient is to discharge. Writer confirmed with [name] [name] that Dr. Jama [name] verbalized that patient is fine to discharge. [name] [name] was notified of this as well and [name] [name] did patient's Suicide Risk Assessment. Patient reports that he knows he is leaving early in the morning. [facility]

## 2014-09-13 NOTE — Progress Notes (Signed)
Patient ID: [name] PennerGary M [name], male   DOB: 07-Oct-1976, 38 y.o.   MRN: [id number] PER STATE REGULATIONS 482.30  THIS CHART WAS REVIEWED FOR MEDICAL NECESSITY WITH RESPECT TO THE PATIENT'S ADMISSION/ DURATION OF STAY.  NEXT REVIEW DATE: 09/17/2014  Willa RoughJENNIFER JONES [name] Manthei, RN, BSN CASE MANAGER

## 2014-09-13 NOTE — [facility] Group Notes (Signed)
[facility]   09/13/2014 12:19 PM    Participation Quality:  Appropraite  Mood/Affect:  Appropriate  Depression Rating:  10  Anxiety Rating:  8  Thoughts of Suicide:  No  Will you contract for safety?   NA  Current AVH:  No  Plan for Discharge/Comments:  Patient attended discharge planning group and actively participated in group. Patient reports being in a lot of pain. He is concerned that he will [facility] be [facility] to pain but willing to give up pain meds to seek treatment.  Suicide prevention education reviewed and SPE document provided.   Transportation Means: Patient has transportation.   Supports:  Patient has a support system.   [name] [name], [name] [name]

## 2014-09-13 NOTE — BHH Suicide Risk Assessment (Signed)
B[facility] INPATIENT:  Family/Significant Other Suicide Prevention Education  Suicide Prevention Education:  Patient Discharged to Other Healthcare Facility:  Suicide Prevention Education Not Provided: {PT. DISCHARGED TO OTHER HEALTHCARE FACILITY:SUICIDE PREVENTION EDUCATION NOT PROVIDED (CHL):  The patient is discharging to another healthcare facility for continuation of treatment.  The patient's medical information, including suicide ideations and risk factors, are a part of the medical information shared with the receiving healthcare facility.   [facility].  He declined SPE with family/friends.  Wynn BankerHodnett, Dietrich Samuelson Hairston 09/13/2014, 12:18 PM

## 2014-09-13 NOTE — BHH Group Notes (Signed)
Adult Psychoeducational Group Note  Date:  09/13/2014 Time:  10:42 PM  Group Topic/Focus:  AA Meeting  Participation Level:  Minimal  Participation Quality:  Redirectable  Affect:  Flat  Cognitive:  Lacking  Insight: Good  Engagement in Group:  Limited  Modes of Intervention:  Discussion and Education  Additional Comments:  Jillyn HiddenGary attended group but had to be redirected for being disruptive.  Caroll RancherLindsay, Kynlei Piontek A 09/13/2014, 10:42 PM

## 2014-09-13 NOTE — B[facility] Group Notes (Signed)
[facility] LCSW Group Therapy          Overcoming Obstacles       1:15 -2:30        09/13/2014   2:17 PM     Type of Therapy:  Group Therapy  Participation Level:  Did not attend group - patient in bed.   [name] [name], [name] [name] 09/13/2014   2:17 PM

## 2014-09-13 NOTE — Progress Notes (Addendum)
Chaplain responding to spiritual care consult.  [name] [name] was asleep upon chaplain arrival.  Was conversant, describing history of back pain, one month of pain and suicidal ideation prior to admission.  [name] [name] is supported by friends from [place], one of whom he regards as a "father figure."   He also has older sister, but she does not know [name] [name] is admitted to hospital.  He states that she has two children and she has supported him through the years.  He does not wish to impose upon her life or let her know that he has been "failed again."  [name] [name] spoke with chaplain about being [facility].  Described feeling in pain and that he is "not going to make it."  Stated that he is "below the level of suicide."  Chaplain inquired whether he had told care team and physicians about his mood.  He stated that he has.  Described being fearful of [facility] at [facility] without the pain medication that he has perceived as helpful.  Stated "what if I'm in pain and have to get my pain med and relapse." Chaplain reported pt's description of SI to RN.   Chaplain provided emotional support around fears around discharge and pt's chronic pain.   [name] [name], [name] [name] Wayne MDiv

## 2014-09-13 NOTE — Plan of Care (Signed)
Problem: Diagnosis: Increased Risk For Suicide Attempt Goal: STG-Patient Will Comply With Medication Regime Outcome: Completed/Met Date Met:  09/13/14 Patient complies with medication regimen

## 2014-09-13 NOTE — Progress Notes (Signed)
Patient ID: [name] [name], male   DOB: Dec 16, 1975, 38 y.o.   MRN: [id number] [facility] MD Progress Note  09/13/2014 1:28 PM [name] [name]  MRN:  [id number] Subjective:  Patient reports he feels " so - so". He complains of ongoing back pain.  Objective : I have discussed case with treatment team and have met with patient. Report from staff is that patient has presented intermittently irritable and has been medication seeking/focused.  Patient remains dysphoric and slightly irritable, but does seem improved compared to admission. He states he has ongoing lower back pain.  He states current medications, to include Robaxin and Neurontin, are helping , however.  No   Overtly disruptive behaviors on unit. Patient is expressing some interest in going to an inpatient Rehab after discharge.  Diagnosis:   Trauma-Stressor Disorders:  Posttraumatic Stress Disorder (309.81)  Substance/Addictive Disorders:  Alcohol Related Disorder - Severe (303.90) and Opioid Disorder - Severe (304.00) Depressive Disorders:  Major Depressive Disorder - Severe (296.23) Total Time spent with patient: 25 minutes  ADL's:  Fair   Sleep: Good   Appetite:  Good  Suicidal Ideation:  Denies presently "becaiuse I'm in her, let me out and I'' kill myself" Homicidal Ideation:  -denies AEB (as evidenced by):  Psychiatric Specialty Exam: Physical Exam  Constitutional: He is oriented to person, place, and time. He appears well-developed and well-nourished.  HENT:  Head: Normocephalic and atraumatic.  Neck: Normal range of motion. Neck supple.  Musculoskeletal: Normal range of motion.  Neurological: He is alert and oriented to person, place, and time.  Skin: Skin is warm and dry.    ROS  Blood pressure 129/106, pulse 97, temperature 97.3 F (36.3 C), temperature source Oral, resp. rate 16, SpO2 100 %.There is no weight on file to calculate BMI.  General Appearance: Fairly Groomed  Engineer, water::  Good  Speech:  Normal Rate   Volume:  Normal  Mood:  Dysphoric  Affect:  Constricted and but a little more reactive than upon admission.   Thought Process:  Goal Directed  Orientation:  Full (Time, Place, and Person)  Thought Content:  Negative  Suicidal Thoughts:  No at this time denies any thoughts of hurting self and  contracts for safety on unit   Homicidal Thoughts:  No  Memory: Recent and Remote grossly intact  Judgement:  Poor  Insight:  Lacking  Psychomotor Activity:  Normal  Concentration:  Fair  Recall:  [employer] of Knowledge:Fair  Language: Fair  Akathisia:  NA  Handed:  Right  AIMS (if indicated):     Assets:  Resilience  Sleep:  Number of Hours: 6.25   Musculoskeletal: Strength & Muscle Tone: within normal limits Gait & Station: normal Patient leans: N/A  Current Medications: Current Facility-Administered Medications  Medication Dose Route Frequency Provider Last Rate Last Dose  . alum & mag hydroxide-simeth (MAALOX/MYLANTA) 200-200-20 MG/5ML suspension 30 mL  30 mL Oral PRN Shuvon Rankin, NP      . [START ON 09/14/2014] buPROPion (WELLBUTRIN XL) 24 hr tablet 300 mg  300 mg Oral Daily Cylan Borum A Izea Livolsi, MD      . cloNIDine (CATAPRES) tablet 0.1 mg  0.1 mg Oral BH-qamhs Jacey Eckerson A Madilyn Cephas, MD   0.1 mg at 09/13/14 0827   Followed by  . [START ON 09/15/2014] cloNIDine (CATAPRES) tablet 0.1 mg  0.1 mg Oral QAC breakfast Myer Peer Rivaldo Hineman, MD      . dicyclomine (BENTYL) tablet 20 mg  20 mg Oral Q6H  PRN Jenne Campus, MD   20 mg at 09/12/14 0604  . gabapentin (NEURONTIN) capsule 800 mg  800 mg Oral QID Shuvon Rankin, NP   800 mg at 09/13/14 1155  . hydrOXYzine (ATARAX/VISTARIL) tablet 25 mg  25 mg Oral Q6H PRN Jenne Campus, MD   25 mg at 09/13/14 1013  . ibuprofen (ADVIL,MOTRIN) tablet 800 mg  800 mg Oral 3 times per day Jenne Campus, MD   800 mg at 09/13/14 0559  . lidocaine (LIDODERM) 5 % 1 patch  1 patch Transdermal Q24H Jenne Campus, MD   1 patch at 09/13/14 1250  . loperamide  (IMODIUM) capsule 2-4 mg  2-4 mg Oral PRN Jenne Campus, MD      . LORazepam (ATIVAN) tablet 1 mg  1 mg Oral Q6H PRN Jenne Campus, MD   1 mg at 09/13/14 0600  . [START ON 09/14/2014] LORazepam (ATIVAN) tablet 1 mg  1 mg Oral Daily Floyce Bujak A Sanayah Munro, MD      . magnesium hydroxide (MILK OF MAGNESIA) suspension 30 mL  30 mL Oral Daily PRN Shuvon Rankin, NP      . methocarbamol (ROBAXIN) tablet 500 mg  500 mg Oral Q8H PRN Jenne Campus, MD   500 mg at 09/13/14 8250  . multivitamin with minerals tablet 1 tablet  1 tablet Oral Daily Jenne Campus, MD   1 tablet at 09/13/14 0827  . nicotine (NICODERM CQ - dosed in mg/24 hours) patch 21 mg  21 mg Transdermal Daily Shuvon Rankin, NP   21 mg at 09/13/14 0603  . ondansetron (ZOFRAN-ODT) disintegrating tablet 4 mg  4 mg Oral Q6H PRN Jenne Campus, MD   4 mg at 09/13/14 1048  . [START ON 09/14/2014] sertraline (ZOLOFT) tablet 100 mg  100 mg Oral Daily Myer Peer Sian Joles, MD      . thiamine (VITAMIN B-1) tablet 100 mg  100 mg Oral Daily Jenne Campus, MD   100 mg at 09/13/14 0827  . traZODone (DESYREL) tablet 50 mg  50 mg Oral QHS,MR X 1 Saramma Eappen, MD   50 mg at 09/12/14 2339    Lab Results:  No results found for this or any previous visit (from the past 48 hour(s)).  Physical Findings: AIMS:  , ,  ,  ,    CIWA:  CIWA-Ar Total: 9 COWS:  COWS Total Score: 8   Assessment: Patient remains dysphoric and irritable but has improved compared to admission. He does continue to focus on medication issues and has exhibited medication seeking behaviors, as discussed with staff. He is , however, expressing understanding of rationale to avoid opiates and BZDs and states that Neurontin and Robaxin do help.  No SI . Treatment Plan Summary:  Daily contact with patient to assess and evaluate symptoms and progress in treatment Medication management  Chaplain consulted Patient is calm and cooperative throughout the afternoon.  We discussed various  coping strategies for stress and anxiety; deep breathing, exercise, journal ing and talk therpay  Plan: Continue inpatient treatment and support  Currently completing Clonidine taper Currently completing Lorazepam taper  Continue Neurontin at 800 mgrs QID Adjust Wellbutrin XL to 300 mgrs QDAY  Zoloft 100 mgrs QDAY  Patient interested in going to an inpatient Rehab after discharge. Social Worker looking into potential options.    Medical Decision Making Problem Points:  Established problem, stable/improving (1) and Review of psycho-social stressors (1) Data Points:  Review of medication regiment &  side effects (2) Review of new medications or change in dosage (2)     I certify that inpatient services furnished can reasonably be expected to improve the patient's condition.   Alyia Lacerte   09/13/2014, 1:28 PM

## 2014-09-13 NOTE — Progress Notes (Signed)
Patient ID: [name], male   DOB: 09/30/1976, 38 y.o.   MRN: [id number] D)  Has been to the nursing station many times this evening, asking what meds he has available for wide range of c/o's from w/d.  At one point seemed drowsy and dropped a pill, was told he may be a little overmedicated, he became angry, said it wasn't his fault he dropped the pill and wasn't coming back to the nursing station.  Was back again in about 30 minutes and wanted trazodone, which he had initially refused.  Sat in the dayroom and walked in the hall talking to a peer, came back and asked for another before going to bed.  Stated he may not go through the rest of his treatment and [facility] because he thinks he wants to stay on his pain meds. A)  Will continue to monitor for safety, continue POC R)  Safety maintained.

## 2014-09-13 NOTE — Progress Notes (Signed)
Patient ID: Noelle PennerGary M Mcginnity, male   DOB: Jul 20, 1976, 38 y.o.   MRN: [id number]  [facility] and reported she would gather discharge medication samples and send over with security.

## 2014-09-14 NOTE — Plan of Care (Signed)
Problem: Alteration in mood & ability to function due to Goal: LTG-Patient demonstrates decreased signs of withdrawal Goal not met. Patient has a CIWA of eleven. CIWA will be at zero prior to discharge.  Burnis Medin [employer], LCSW 09/10/2014  Goal met. Patient is not endorsing any sign/symptoms of withdrawal at this time. No CIWA noted for today.  Burnis Medin Nhan Qualley, LCSW 09/14/2014 9:00 AM      (Patient demonstrates decreased signs of withdrawal to the point the patient is safe to return home and continue treatment in an outpatient setting)  Outcome: Completed/Met Date Met:  09/14/14

## 2014-09-14 NOTE — Progress Notes (Signed)
[facility] Adult [facility] Discharge Plan :  Will you be returning to the same living situation after discharge: No.  [facility] At discharge, do you have transportation home?:Yes,  Patient arranged transportation with friend. Do you have the ability to pay for your medications:Yes,  Patient able to obtain medications.  Release of information consent forms completed and in the chart;  Patient's signature needed at discharge.  Patient to Follow up at: Follow-up Information    Follow up with [facility] On 09/14/2014.   Why:  Tuesday, September 14, 2014 at 8:00 AM   Contact information:   [address] [place], [place]   [id number]  [phone]      Patient denies SI/HI:   Patient no longer endorsing SI/HI or other thoughts of self harm.   Safety Planning and Suicide Prevention discussed:  .Reviewed with all patients during discharge planning group  Zanaria Morell, Joesph JulyQuylle Hairston 09/14/2014, 8:58 AM

## 2014-09-14 NOTE — Progress Notes (Signed)
Patient ID: [name] PennerGary M Beiser, male   DOB: [birth year], 38 y.o.   MRN: [id number] Pt discharged to lobby approximately [phone]. Pt provided with discharge instructions and pt verbalized understanding. Pt provided with prescriptions and sample medications. Pt did receive AM doses of wellbutrin, gabapentin, motrin, and zoloft prior to discharge and was educated about administration times and pt verbalized understanding. Pt did not leave with leather coat that was documented that he came in with as it was unable to be located and as such was unable to sign search/belongings sheet as he did not get all of his property back. Pt provided with numbers of Engineering geologistdirector and assistant director in regards to following up and pt verbalized understanding. Pt denied SI at time of discharge.

## 2014-09-14 NOTE — Plan of Care (Signed)
Problem: Ineffective individual coping Goal: STG: Patient will participate in after care plan Patient will attend attend groups and engage in discussion. Outpatient follow up appointment will be scheduled.  Burnis Medin Josey Forcier, LCSW 09/10/2014 10:30 AM  Patient has attended groups and easily engaged in discussions. Outpatient follow up is scheduled with [facility], LCSW 09/14/2014 9:01 AM     Outcome: Completed/Met Date Met:  09/14/14

## 2014-09-15 NOTE — Progress Notes (Signed)
Patient Discharge Instructions:  After Visit Summary (AVS):   Faxed to:  09/15/14 Psychiatric Admission Assessment Note:   Faxed to:  09/15/14 Suicide Risk Assessment - Discharge Assessment:   Faxed to:  09/15/14 Faxed/Sent to the Next Level Care provider:  09/15/14 [facility] to [facility] @ [phone]  Jerelene ReddenSheena E [place], 09/15/2014, 2:46 PM

## 2014-09-17 ENCOUNTER — Emergency Department (HOSPITAL_BASED_OUTPATIENT_CLINIC_OR_DEPARTMENT_OTHER)
Admission: EM | Admit: 2014-09-17 | Discharge: 2014-09-17 | Disposition: A | Discharge status: Home / Self Care | Payer: Medicare Other | Attending: Emergency Medicine | Admitting: Emergency Medicine

## 2014-09-17 ENCOUNTER — Encounter (HOSPITAL_BASED_OUTPATIENT_CLINIC_OR_DEPARTMENT_OTHER): Payer: Self-pay

## 2014-09-17 DIAGNOSIS — F319 Bipolar disorder, unspecified: Secondary | ICD-10-CM | POA: Diagnosis not present

## 2014-09-17 DIAGNOSIS — Z79899 Other long term (current) drug therapy: Secondary | ICD-10-CM | POA: Diagnosis not present

## 2014-09-17 DIAGNOSIS — M79605 Pain in left leg: Secondary | ICD-10-CM | POA: Diagnosis present

## 2014-09-17 DIAGNOSIS — Z880 Allergy status to penicillin: Secondary | ICD-10-CM | POA: Insufficient documentation

## 2014-09-17 DIAGNOSIS — Z8619 Personal history of other infectious and parasitic diseases: Secondary | ICD-10-CM | POA: Diagnosis not present

## 2014-09-17 DIAGNOSIS — L03116 Cellulitis of left lower limb: Secondary | ICD-10-CM | POA: Diagnosis not present

## 2014-09-17 DIAGNOSIS — Z720 Tobacco use: Secondary | ICD-10-CM | POA: Insufficient documentation

## 2014-09-17 LAB — CBC WITH DIFFERENTIAL/PLATELET
BASOS ABS: 0 10*3/uL (ref 0.0–0.1)
Basophils Relative: 1 % (ref 0–1)
EOS PCT: 4 % (ref 0–5)
Eosinophils Absolute: 0.3 10*3/uL (ref 0.0–0.7)
HCT: 44.7 % (ref 39.0–52.0)
Hemoglobin: 15.2 g/dL (ref 13.0–17.0)
LYMPHS ABS: 2.2 10*3/uL (ref 0.7–4.0)
LYMPHS PCT: 28 % (ref 12–46)
MCH: 32 pg (ref 26.0–34.0)
MCHC: 34 g/dL (ref 30.0–36.0)
MCV: 94.1 fL (ref 78.0–100.0)
Monocytes Absolute: 1 10*3/uL (ref 0.1–1.0)
Monocytes Relative: 12 % (ref 3–12)
NEUTROS PCT: 55 % (ref 43–77)
Neutro Abs: 4.4 10*3/uL (ref 1.7–7.7)
Platelets: 243 10*3/uL (ref 150–400)
RBC: 4.75 MIL/uL (ref 4.22–5.81)
RDW: 13.3 % (ref 11.5–15.5)
WBC: 7.9 10*3/uL (ref 4.0–10.5)

## 2014-09-17 MED ORDER — IBUPROFEN 800 MG PO TABS: 800.0000 mg | ORAL_TABLET | Freq: Three times a day (TID) | ORAL | Status: DC | PRN

## 2014-09-17 MED ORDER — CLINDAMYCIN HCL 150 MG PO CAPS
450.0000 mg | ORAL_CAPSULE | Freq: Once | ORAL | Status: AC
  Administered 2014-09-17: 450 mg via ORAL
  Filled 2014-09-17: qty 3

## 2014-09-17 MED ORDER — CLINDAMYCIN HCL 150 MG PO CAPS: 450.0000 mg | ORAL_CAPSULE | Freq: Three times a day (TID) | ORAL | Status: DC

## 2014-09-17 NOTE — ED Provider Notes (Signed)
CSN: [id number]     Arrival date & time 09/17/14  [id number] History   First MD Initiated Contact with Patient 09/17/14 [phone]     Chief Complaint  Patient presents with  . Leg Pain   [name] PennerGary M Sidener is a 38 y.o. male with a history of LLE cellulitis, Hep C and polysubstance abuse presenting with left leg pain.   He endorses redness and swelling that started on the top of his left foot about 2 days ago which has spread up the front of the leg. It is very mildly painful, nothing taken. He denies fever, chest pain, shortness of breath, recent immobilization/surgery, h/o clots, FH clots.   He has been admitted twice in the past 6 months due to cellulitis of this extremity.    (Consider location/radiation/quality/duration/timing/severity/associated sxs/prior Treatment) HPI  Past Medical History  Diagnosis Date  . Bipolar disorder   . Hepatitis C   . Depression   . Cellulitis    Past Surgical History  Procedure Laterality Date  . Back surgery    . Shoulder surgery     Family History  Problem Relation Age of Onset  . Alcohol abuse Father   . Cancer Maternal Aunt    History  Substance Use Topics  . Smoking status: Current Every Day Smoker -- 1.50 packs/day    Types: Cigarettes    Start date: 10/22/1981  . Smokeless tobacco: Never Used  . Alcohol Use: Yes     Comment: pt states approx a case a day    Review of Systems  Constitutional: Negative for fever, chills, diaphoresis, appetite change and fatigue.  HENT: Negative for congestion and rhinorrhea.   Respiratory: Negative for cough, chest tightness and shortness of breath.   Cardiovascular: Negative for chest pain and palpitations.  Gastrointestinal: Negative for diarrhea.  Musculoskeletal: Negative for myalgias and arthralgias.  Skin: Positive for rash. Negative for wound.  Neurological: Negative for dizziness and numbness.   Allergies  Lithium and Penicillins  Home Medications   Prior to Admission medications   Medication  Sig Start Date End Date Taking? Authorizing Provider  buPROPion (WELLBUTRIN XL) 300 MG 24 hr tablet Take 1 tablet (300 mg total) by mouth daily. 09/14/14  Yes Velna HatchetSheila May Agustin, NP  gabapentin (NEURONTIN) 400 MG capsule Take 2 capsules (800 mg total) by mouth 4 (four) times daily. 09/13/14  Yes Velna HatchetSheila May Agustin, NP  hydrOXYzine (ATARAX/VISTARIL) 25 MG tablet Take 1 tablet (25 mg total) by mouth every 6 (six) hours as needed for anxiety. 09/13/14  Yes Velna HatchetSheila May Agustin, NP  lidocaine (LIDODERM) 5 % Place 1 patch onto the skin daily. Remove & Discard patch within 12 hours or as directed by MD 09/13/14  Yes Velna HatchetSheila May Agustin, NP  sertraline (ZOLOFT) 100 MG tablet Take 1 tablet (100 mg total) by mouth daily. 09/14/14  Yes Velna HatchetSheila May Agustin, NP  traZODone (DESYREL) 50 MG tablet Take 1 tablet (50 mg total) by mouth at bedtime and may repeat dose one time if needed. 09/13/14  Yes Velna HatchetSheila May Agustin, NP  clindamycin (CLEOCIN) 150 MG capsule Take 3 capsules (450 mg total) by mouth 3 (three) times daily. for 7 days 09/17/14   Tyrone Nineyan B Rogan Wigley, MD  ibuprofen (ADVIL,MOTRIN) 800 MG tablet Take 1 tablet (800 mg total) by mouth every 8 (eight) hours as needed for mild pain or moderate pain. 09/17/14   Velna HatchetSheila May Agustin, NP  methocarbamol (ROBAXIN) 500 MG tablet Take 1 tablet (500 mg total) by mouth every 8 (  eight) hours as needed for muscle spasms. 09/13/14   Velna HatchetSheila May Agustin, NP   BP 156/82 mmHg  Pulse 66  Temp(Src) 98.2 F (36.8 C) (Oral)  Resp 20  Ht 5\' 10"  (1.778 m)  Wt 220 lb (99.791 kg)  BMI 31.57 kg/m2  SpO2 99% Physical Exam  Constitutional: He is oriented to person, place, and time. He appears well-developed and well-nourished. No distress.  HENT:  Mouth/Throat: Oropharynx is clear and moist.  Eyes: Conjunctivae and EOM are normal. Pupils are equal, round, and reactive to light.  Neck: Neck supple. No JVD present.  Cardiovascular: Normal rate, regular rhythm, normal heart sounds and intact  distal pulses.   No murmur heard. Pulmonary/Chest: Effort normal and breath sounds normal. No respiratory distress.  Abdominal: Soft. Bowel sounds are normal. He exhibits no distension. There is no tenderness.  Musculoskeletal:  Left foot: dorsum with mild erythema and edema without induration or fluctuance.  Left anterior shin has well demarcated erythematous irregular patch that is only mildly tender and trace pitting edema. Calf same size as right. Negative homans.   Lymphadenopathy:    He has no cervical adenopathy.  Neurological: He is alert and oriented to person, place, and time. He exhibits normal muscle tone.  Skin: Skin is warm and dry.  Vitals reviewed.   ED Course  Procedures (including critical care time) Labs Review Labs Reviewed  CBC WITH DIFFERENTIAL    Imaging Review No results found.   EKG Interpretation None      MDM   Final diagnoses:  Cellulitis of left lower extremity   Highly doubt DVT   Uncomplicated non-suppurative recurrent cellulitis of LLE without constitutional symptoms. PCN allergy noted. No SIRS (respiratory rate normal on exam). Outpatient tx for this mild-moderate infection as follows: - Rx clindamycin 450mg  TID x 7 days - NSAIDs prn pain, avoid tylenol in setting of Hep C - Follow up if not improving in 72 hours.  - Should consider prophylactic antibiotics and/or investigation into predisposition to recurrent cellulitis.    Tyrone Nineyan B Payden Docter, MD 09/17/14 1128  Tyrone Nineyan B Lovada Barwick, MD 09/17/14 1131  Nelia Shiobert L Beaton, MD 09/17/14 1134

## 2014-09-17 NOTE — Discharge Instructions (Signed)
Cellulitis - Take clindamycin 450mg  (three pills at a time) three times per day for the next 7 days.  - It may be wise to talk with your doctor about why you keep getting these infections.  - Come back to the ER if you're getting worse or not getting better in 72 hours.  - You can take ibuprofen for discomfort but no tylenol.   Cellulitis is an infection of the skin and the tissue beneath it. The infected area is usually red and tender. Cellulitis occurs most often in the arms and lower legs.  CAUSES  Cellulitis is caused by bacteria that enter the skin through cracks or cuts in the skin. The most common types of bacteria that cause cellulitis are staphylococci and streptococci. SIGNS AND SYMPTOMS   Redness and warmth.  Swelling.  Tenderness or pain.  Fever. DIAGNOSIS  Your health care provider can usually determine what is wrong based on a physical exam. Blood tests may also be done. TREATMENT  Treatment usually involves taking an antibiotic medicine. HOME CARE INSTRUCTIONS   Take your antibiotic medicine as directed by your health care provider. Finish the antibiotic even if you start to feel better.  Keep the infected arm or leg elevated to reduce swelling.  Apply a warm cloth to the affected area up to 4 times per day to relieve pain.  Take medicines only as directed by your health care provider.  Keep all follow-up visits as directed by your health care provider. SEEK MEDICAL CARE IF:   You notice red streaks coming from the infected area.  Your red area gets larger or turns dark in color.  Your bone or joint underneath the infected area becomes painful after the skin has healed.  Your infection returns in the same area or another area.  You notice a swollen bump in the infected area.  You develop new symptoms.  You have a fever. SEEK IMMEDIATE MEDICAL CARE IF:   You feel very sleepy.  You develop vomiting or diarrhea.  You have a general ill feeling  (malaise) with muscle aches and pains. MAKE SURE YOU:   Understand these instructions.  Will watch your condition.  Will get help right away if you are not doing well or get worse. Document Released: 07/18/2005 Document Revised: 02/22/2014 Document Reviewed: 12/24/2011 [facility] Patient Information 2015 [employer] [place]. This information is not intended to replace advice given to you by your health care provider. Make sure you discuss any questions you have with your health care provider.

## 2014-09-17 NOTE — Discharge Summary (Signed)
Physician Discharge Summary Note  Patient:  [name] [name] is an 38 y.o., male MRN:  [id number] DOB:  1976-07-08 Patient phone:  [phone] (home)  Patient address:   [address] [place] [address],  Total Time spent with patient: 30 minutes  Date of Admission:  09/09/2014 Date of Discharge: 09/13/2014  Reason for Admission:  Suicide ideation  Discharge Diagnoses:  Alcohol Dependence, Opioid Dependence, GAD, MDD  Active Problems:   MDD (major depressive disorder)   Alcohol dependence with alcohol-induced mood disorder   Opioid dependence with opioid-induced mood disorder   Psychiatric Specialty Exam: Physical Exam  Vitals reviewed. Psychiatric: His speech is normal and behavior is normal. Thought content normal. His mood appears anxious. Cognition and memory are normal.    Review of Systems  Constitutional: Negative.   HENT: Negative.   Eyes: Negative.   Respiratory: Negative.   Cardiovascular: Negative.   Gastrointestinal: Negative.   Genitourinary: Negative.   Musculoskeletal: Negative.   Skin: Negative.   Neurological: Negative.   Endo/Heme/Allergies: Negative.   Psychiatric/Behavioral: Positive for depression (chronic, stabilized). Negative for suicidal ideas, hallucinations, memory loss and substance abuse (stabilized). The patient is nervous/anxious. The patient does not have insomnia.     Blood pressure 130/76, pulse 83, temperature 97.8 F (36.6 C), temperature source Oral, resp. rate 16, SpO2 100 %.There is no weight on file to calculate BMI.   Discharge Diagnoses:  AXIS I: Alcohol Dependence, Opioid Dependence, GAD, MDD AXIS II: No diagnosis AXIS III:  Past Medical History  Diagnosis Date  . Bipolar disorder   . Hepatitis C   . Depression   . Cellulitis    AXIS IV: other psychosocial or environmental problems AXIS V: 61-70 mild symptoms   Level of Care:  OP  Hospital Course:  38 yo male self admitted through the ED  with suicidal thoughts "jumping off a bridge". Long h/o alcohol, opioid abuse, h/o chronic pain, depression. Admitted for detox and to "get my life together".   He has h/o chronic neck/back pain. He is a patient of [facility]. He gets his medication filled at [facility], meds (Nucynta 100 mg po QID) verified and compliance since 03/2014.  He expressed that he ultimately wants to get into an inpatient treatment program.  Patient was accepted for inpatient treatment.  Group therapy and medication management as part of treatment plan.  Patient completed Clonidine and Ativan taper.  He was given Neurontin at 800 mg for added pain control, Wellbutrin XL 300 mg and Zoloft 100 mgfor depression.  He tolerated meds well and no report of adverse side effects.     Attendance was observed in group therapy to learn various coping strategies for stress and anxiety. Patient was observed to be irritable but has improved compared to admission. He does continue to focus on medication issues and has exhibited medication seeking behaviors, as discussed with staff. He did however, expressed understanding of rationale to avoid opiates and BZDs and stated that Neurontin and Robaxin do help. No SI .  Daily contact with patient to assess and evaluate symptoms and progress in treatment.  On day of discharge, patient was calm and cooperative throughout the afternoon.  He was encouraged to maintain follow up appt at [facility] and to be adherent with medication regimen to help with relapse  And long term recovery.    Consults:  psychiatry  Significant Diagnostic Studies:  labs: per ED  Discharge Vitals:   Blood pressure 130/76, pulse  83, temperature 97.8 F (36.6 C), temperature source Oral, resp. rate 16, SpO2 100 %. There is no weight on file to calculate BMI. Lab Results:   No results found for this or any previous visit (from the past 72 hour(s)).  Physical  Findings: AIMS:  , ,  ,  ,    CIWA:  CIWA-Ar Total: 7 COWS:  COWS Total Score: 8  Psychiatric Specialty Exam: See Psychiatric Specialty Exam and Suicide Risk Assessment completed by Attending Physician prior to discharge.  Discharge destination:  Home  Is patient on multiple antipsychotic therapies at discharge:  No   Has Patient had three or more failed trials of antipsychotic monotherapy by history:  No  Recommended Plan for Multiple Antipsychotic Therapies: NA     Medication List    STOP taking these medications        buPROPion 200 MG 12 hr tablet  Commonly known as:  WELLBUTRIN SR  Replaced by:  buPROPion 300 MG 24 hr tablet     gabapentin 800 MG tablet  Commonly known as:  NEURONTIN  Replaced by:  gabapentin 400 MG capsule     NUCYNTA 75 MG Tabs  Generic drug:  Tapentadol HCl      TAKE these medications      Indication   buPROPion 300 MG 24 hr tablet  Commonly known as:  WELLBUTRIN XL  Take 1 tablet (300 mg total) by mouth daily.   Indication:  Major Depressive Disorder     gabapentin 400 MG capsule  Commonly known as:  NEURONTIN  Take 2 capsules (800 mg total) by mouth 4 (four) times daily.   Indication:  Pain     hydrOXYzine 25 MG tablet  Commonly known as:  ATARAX/VISTARIL  Take 1 tablet (25 mg total) by mouth every 6 (six) hours as needed for anxiety.   Indication:  Anxiety Neurosis     ibuprofen 800 MG tablet  Commonly known as:  ADVIL,MOTRIN  Take 1 tablet (800 mg total) by mouth every 8 (eight) hours as needed for mild pain or moderate pain.   Indication:  Inflammation, Joint Damage causing Pain and Loss of Function     lidocaine 5 %  Commonly known as:  LIDODERM  Place 1 patch onto the skin daily. Remove & Discard patch within 12 hours or as directed by MD   Indication:  Allodynia     methocarbamol 500 MG tablet  Commonly known as:  ROBAXIN  Take 1 tablet (500 mg total) by mouth every 8 (eight) hours as needed for muscle spasms.    Indication:  Musculoskeletal Pain     sertraline 100 MG tablet  Commonly known as:  ZOLOFT  Take 1 tablet (100 mg total) by mouth daily.   Indication:  Major Depressive Disorder     traZODone 50 MG tablet  Commonly known as:  DESYREL  Take 1 tablet (50 mg total) by mouth at bedtime and may repeat dose one time if needed.   Indication:  Trouble Sleeping       Follow-up Information    [facility]14[facility].   Why:  Tuesday, September 14, 2014 at 8:00 AM   Contact information:   [address] [place], [place]   [id number]  [phone]      Follow-up recommendations:  Activity:  as tol, diet as tol  Comments:  Take all medications as prescribed. Keep all follow-up appointments as scheduled.  Do not consume alcohol or use  illegal drugs while on prescription medications. Report any adverse effects from your medications to your primary care provider promptly.  In the event of recurrent symptoms or worsening symptoms, call 911, a crisis hotline, or go to the nearest emergency department for evaluation.   Total Discharge Time:  Greater than 30 minutes.  SignedAdonis Brook: AGUSTIN, SHEILA MAY, AGNP-BC 09/17/2014, 9:57 AM  I personally assessed the patient and formulated the plan Madie RenoIrving A. Dub MikesLugo, M.D.

## 2014-09-17 NOTE — ED Notes (Signed)
Pt reports LLE pain, edema, red for 2 days - reports 2 hospitalizations during the past year for cellulitis. [facility].

## 2014-10-13 ENCOUNTER — Encounter (HOSPITAL_BASED_OUTPATIENT_CLINIC_OR_DEPARTMENT_OTHER): Payer: Self-pay | Admitting: *Deleted

## 2014-10-13 ENCOUNTER — Emergency Department (HOSPITAL_BASED_OUTPATIENT_CLINIC_OR_DEPARTMENT_OTHER)
Admission: EM | Admit: 2014-10-13 | Discharge: 2014-10-13 | Disposition: A | Discharge status: Home / Self Care | Payer: Medicare Other | Attending: Emergency Medicine | Admitting: Emergency Medicine

## 2014-10-13 ENCOUNTER — Emergency Department (HOSPITAL_BASED_OUTPATIENT_CLINIC_OR_DEPARTMENT_OTHER): Payer: Medicare Other

## 2014-10-13 DIAGNOSIS — Z8619 Personal history of other infectious and parasitic diseases: Secondary | ICD-10-CM | POA: Insufficient documentation

## 2014-10-13 DIAGNOSIS — M79605 Pain in left leg: Secondary | ICD-10-CM | POA: Diagnosis present

## 2014-10-13 DIAGNOSIS — F319 Bipolar disorder, unspecified: Secondary | ICD-10-CM | POA: Insufficient documentation

## 2014-10-13 DIAGNOSIS — Z720 Tobacco use: Secondary | ICD-10-CM | POA: Insufficient documentation

## 2014-10-13 DIAGNOSIS — L03116 Cellulitis of left lower limb: Secondary | ICD-10-CM | POA: Insufficient documentation

## 2014-10-13 DIAGNOSIS — Z792 Long term (current) use of antibiotics: Secondary | ICD-10-CM | POA: Diagnosis not present

## 2014-10-13 DIAGNOSIS — Z880 Allergy status to penicillin: Secondary | ICD-10-CM | POA: Insufficient documentation

## 2014-10-13 DIAGNOSIS — Z79899 Other long term (current) drug therapy: Secondary | ICD-10-CM | POA: Diagnosis not present

## 2014-10-13 DIAGNOSIS — R609 Edema, unspecified: Secondary | ICD-10-CM

## 2014-10-13 LAB — BASIC METABOLIC PANEL
Anion gap: 7 (ref 5–15)
BUN: 10 mg/dL (ref 6–23)
CHLORIDE: 107 meq/L (ref 96–112)
CO2: 26 mmol/L (ref 19–32)
CREATININE: 0.62 mg/dL (ref 0.50–1.35)
Calcium: 9.1 mg/dL (ref 8.4–10.5)
GFR calc non Af Amer: >90 mL/min (ref >90)
GLUCOSE: 94 mg/dL (ref 70–99)
POTASSIUM: 3.9 mmol/L (ref 3.5–5.1)
Sodium: 140 mmol/L (ref 135–145)

## 2014-10-13 LAB — CBC
HEMATOCRIT: 43.9 % (ref 39.0–52.0)
HEMOGLOBIN: 15 g/dL (ref 13.0–17.0)
MCH: 32.2 pg (ref 26.0–34.0)
MCHC: 34.2 g/dL (ref 30.0–36.0)
MCV: 94.2 fL (ref 78.0–100.0)
Platelets: 207 10*3/uL (ref 150–400)
RBC: 4.66 MIL/uL (ref 4.22–5.81)
RDW: 13.4 % (ref 11.5–15.5)
WBC: 14.5 10*3/uL — ABNORMAL HIGH (ref 4.0–10.5)

## 2014-10-13 MED ORDER — CLINDAMYCIN HCL 300 MG PO CAPS: 300.0000 mg | ORAL_CAPSULE | Freq: Four times a day (QID) | ORAL | Status: DC

## 2014-10-13 NOTE — ED Notes (Signed)
Pt amb to triage with quick steady gait in nad. Pt reports hx of cellulitis x march, has been hospitalized x 2, states he noticed increased swelling and redness 2 days ago.

## 2014-10-13 NOTE — ED Provider Notes (Signed)
CSN: [id number]     Arrival date & time 10/13/14  1056 History   First MD Initiated Contact with Patient 10/13/14 1326     Chief Complaint  Patient presents with  . Leg Pain     (Consider location/radiation/quality/duration/timing/severity/associated sxs/prior Treatment) HPI Comments: 38 year old male with a past medical history of polysubstance abuse, bipolar disorder, hepatitis C, depression and recurrent cellulitis in left lower extremity presenting with pain, redness and swelling in his left lower extremity 2 days. Patient reports he has cellulitis in March 2015 when he was hospitalized, followed by another episode in November where he was treated with oral medications outpatient. He states he completed the course of antibiotics and the cellulitis went away. He states he has a small blister on the bottom of his foot that may be the cause of the infection. No injury or trauma. He currently does not have a PCP and is in [facility] for drug abuse. He states he was ruled out for blood clot when he was in the hospital. Denies any history of blood clots. Denies fever, chills, nausea, vomiting, shortness of breath or chest pain. No recent surgeries.  Patient is a 38 y.o. male presenting with leg pain. The history is provided by the patient.  Leg Pain   Past Medical History  Diagnosis Date  . Bipolar disorder   . Hepatitis C   . Depression   . Cellulitis    Past Surgical History  Procedure Laterality Date  . Back surgery    . Shoulder surgery     Family History  Problem Relation Age of Onset  . Alcohol abuse Father   . Cancer Maternal Aunt    History  Substance Use Topics  . Smoking status: Current Every Day Smoker -- 1.50 packs/day    Types: Cigarettes    Start date: 10/22/1981  . Smokeless tobacco: Never Used  . Alcohol Use: Yes     Comment: pt states approx a case a day    Review of Systems  10 Systems reviewed and are negative for acute change except as noted in the  HPI.  Allergies  Lithium and Penicillins  Home Medications   Prior to Admission medications   Medication Sig Start Date End Date Taking? Authorizing Provider  buPROPion (WELLBUTRIN XL) 300 MG 24 hr tablet Take 1 tablet (300 mg total) by mouth daily. 09/14/14   Velna Hatchet May Agustin, NP  clindamycin (CLEOCIN) 300 MG capsule Take 1 capsule (300 mg total) by mouth 4 (four) times daily. X 7 days 10/13/14   Kathrynn Speed, PA-C  gabapentin (NEURONTIN) 400 MG capsule Take 2 capsules (800 mg total) by mouth 4 (four) times daily. 09/13/14   Lindwood Qua, NP  hydrOXYzine (ATARAX/VISTARIL) 25 MG tablet Take 1 tablet (25 mg total) by mouth every 6 (six) hours as needed for anxiety. 09/13/14   Velna Hatchet May Agustin, NP  ibuprofen (ADVIL,MOTRIN) 800 MG tablet Take 1 tablet (800 mg total) by mouth every 8 (eight) hours as needed for mild pain or moderate pain. 09/17/14   Velna Hatchet May Agustin, NP  lidocaine (LIDODERM) 5 % Place 1 patch onto the skin daily. Remove & Discard patch within 12 hours or as directed by MD 09/13/14   Lindwood Qua, NP  methocarbamol (ROBAXIN) 500 MG tablet Take 1 tablet (500 mg total) by mouth every 8 (eight) hours as needed for muscle spasms. 09/13/14   Velna Hatchet May Agustin, NP  sertraline (ZOLOFT) 100 MG tablet Take 1 tablet (100 mg total)  by mouth daily. 09/14/14   Lindwood QuaSheila May Agustin, NP  traZODone (DESYREL) 50 MG tablet Take 1 tablet (50 mg total) by mouth at bedtime and may repeat dose one time if needed. 09/13/14   Velna HatchetSheila May Agustin, NP   BP 127/57 mmHg  Pulse 87  Temp(Src) 98 F (36.7 C) (Oral)  Resp 18  Ht 5\' 11"  (1.803 m)  Wt 220 lb (99.791 kg)  BMI 30.70 kg/m2  SpO2 99% Physical Exam  Constitutional: He is oriented to person, place, and time. He appears well-developed and well-nourished. No distress.  HENT:  Head: Normocephalic and atraumatic.  Eyes: Conjunctivae and EOM are normal.  Neck: Normal range of motion. Neck supple.  Cardiovascular: Normal rate,  regular rhythm and normal heart sounds.   Pulses:      Dorsalis pedis pulses are 2+ on the left side.       Posterior tibial pulses are 2+ on the left side.  Pulmonary/Chest: Effort normal and breath sounds normal.  Musculoskeletal:       Legs: Small clear blister with skin intact on plantar aspect of left foot beneath 1st and 2nd MCP. No erythema. Negative Homan's.  Neurological: He is alert and oriented to person, place, and time.  Skin: Skin is warm and dry.  Psychiatric: He has a normal mood and affect. His behavior is normal.  Nursing note and vitals reviewed.   ED Course  Procedures (including critical care time) Labs Review Labs Reviewed  CBC - Abnormal; Notable for the following:    WBC 14.5 (*)    All other components within normal limits  BASIC METABOLIC PANEL    Imaging Review [place] Venous Img Lower Unilateral Left  10/13/2014   CLINICAL DATA:  Left lower extremity redness and swelling for 3 days  EXAM: LEFT LOWER EXTREMITY VENOUS DUPLEX ULTRASOUND  TECHNIQUE: Gray-scale sonography with graded compression, as well as color Doppler and duplex ultrasound were performed to evaluate the left lower extremity deep venous system from the level of the common femoral vein and including the common femoral, femoral, profunda femoral, popliteal and calf veins including the posterior tibial, peroneal and gastrocnemius veins when visible. The superficial great saphenous vein was also interrogated. Spectral Doppler was utilized to evaluate flow at rest and with distal augmentation maneuvers in the common femoral, femoral and popliteal veins.  COMPARISON:  None.  FINDINGS: Contralateral Common Femoral Vein: Respiratory phasicity is normal and symmetric with the symptomatic side. No evidence of thrombus. Normal compressibility.  Common Femoral Vein: No evidence of thrombus. Normal compressibility, respiratory phasicity and response to augmentation.  Saphenofemoral Junction: No evidence of  thrombus. Normal compressibility and flow on color Doppler imaging.  Profunda Femoral Vein: No evidence of thrombus. Normal compressibility and flow on color Doppler imaging.  Femoral Vein: No evidence of thrombus. Normal compressibility, respiratory phasicity and response to augmentation.  Popliteal Vein: No evidence of thrombus. Normal compressibility, respiratory phasicity and response to augmentation.  Calf Veins: No evidence of thrombus. Normal compressibility and flow on color Doppler imaging.  Superficial Great Saphenous Vein: No evidence of thrombus. Normal compressibility and flow on color Doppler imaging.  Venous Reflux:  None.  Other Findings:  There is left calf region edema.  IMPRESSION: No evidence of left lower extremity deep venous thrombosis. The right common femoral vein also patent. There is left calf region soft tissue edema.   Electronically Signed   By: Bretta BangWilliam  Woodruff M.D.   On: 10/13/2014 15:05     EKG Interpretation None  MDM   Final diagnoses:  Left leg cellulitis   Pt presenting with redness and swelling of LLE. Hx of cellulitis. AFVSS. NAD. DVT ruled out in April, however pt has been in the hospital since. Venous duplex negative for DVT. Leukocytosis of 14.5. Clinically cellulitis. No lymphatic streaking. Ambulates without difficulty. Does not meet SIRS criteria. Treat with clinda. Skin markings drawn. Advised PCP f/u in 2 days. Stable for d/c. Return precautions given. Patient states understanding of treatment care plan and is agreeable.  Discussed with attending Dr. Micheline Mazeocherty who agrees with plan of care.    Kathrynn SpeedRobyn M Zamauri Nez, PA-C 10/13/14 1538  Toy CookeyMegan Docherty, MD 10/13/14 [phone]

## 2014-10-13 NOTE — Discharge Instructions (Signed)
Take clindamycin as directed for 1 week.  Cellulitis Cellulitis is an infection of the skin and the tissue beneath it. The infected area is usually red and tender. Cellulitis occurs most often in the arms and lower legs.  CAUSES  Cellulitis is caused by bacteria that enter the skin through cracks or cuts in the skin. The most common types of bacteria that cause cellulitis are staphylococci and streptococci. SIGNS AND SYMPTOMS   Redness and warmth.  Swelling.  Tenderness or pain.  Fever. DIAGNOSIS  Your health care provider can usually determine what is wrong based on a physical exam. Blood tests may also be done. TREATMENT  Treatment usually involves taking an antibiotic medicine. HOME CARE INSTRUCTIONS   Take your antibiotic medicine as directed by your health care provider. Finish the antibiotic even if you start to feel better.  Keep the infected arm or leg elevated to reduce swelling.  Apply a warm cloth to the affected area up to 4 times per day to relieve pain.  Take medicines only as directed by your health care provider.  Keep all follow-up visits as directed by your health care provider. SEEK MEDICAL CARE IF:   You notice red streaks coming from the infected area.  Your red area gets larger or turns dark in color.  Your bone or joint underneath the infected area becomes painful after the skin has healed.  Your infection returns in the same area or another area.  You notice a swollen bump in the infected area.  You develop new symptoms.  You have a fever. SEEK IMMEDIATE MEDICAL CARE IF:   You feel very sleepy.  You develop vomiting or diarrhea.  You have a general ill feeling (malaise) with muscle aches and pains. MAKE SURE YOU:   Understand these instructions.  Will watch your condition.  Will get help right away if you are not doing well or get worse. Document Released: 07/18/2005 Document Revised: 02/22/2014 Document Reviewed:  12/24/2011 [facility] Patient Information 2015 [employer] [place]. This information is not intended to replace advice given to you by your health care provider. Make sure you discuss any questions you have with your health care provider.

## 2014-10-20 ENCOUNTER — Inpatient Hospital Stay (HOSPITAL_BASED_OUTPATIENT_CLINIC_OR_DEPARTMENT_OTHER)
Admission: EM | Admit: 2014-10-20 | Discharge: 2014-10-23 | DRG: 872 | Payer: Medicare Other | Attending: Internal Medicine | Admitting: Internal Medicine

## 2014-10-20 ENCOUNTER — Inpatient Hospital Stay (HOSPITAL_COMMUNITY): Payer: Medicare Other

## 2014-10-20 ENCOUNTER — Encounter (HOSPITAL_BASED_OUTPATIENT_CLINIC_OR_DEPARTMENT_OTHER): Payer: Self-pay | Admitting: Emergency Medicine

## 2014-10-20 DIAGNOSIS — Z880 Allergy status to penicillin: Secondary | ICD-10-CM

## 2014-10-20 DIAGNOSIS — F17200 Nicotine dependence, unspecified, uncomplicated: Secondary | ICD-10-CM | POA: Diagnosis present

## 2014-10-20 DIAGNOSIS — F1290 Cannabis use, unspecified, uncomplicated: Secondary | ICD-10-CM | POA: Diagnosis present

## 2014-10-20 DIAGNOSIS — Z811 Family history of alcohol abuse and dependence: Secondary | ICD-10-CM

## 2014-10-20 DIAGNOSIS — F319 Bipolar disorder, unspecified: Secondary | ICD-10-CM | POA: Diagnosis present

## 2014-10-20 DIAGNOSIS — B182 Chronic viral hepatitis C: Secondary | ICD-10-CM | POA: Diagnosis present

## 2014-10-20 DIAGNOSIS — A419 Sepsis, unspecified organism: Principal | ICD-10-CM | POA: Diagnosis present

## 2014-10-20 DIAGNOSIS — L0390 Cellulitis, unspecified: Secondary | ICD-10-CM

## 2014-10-20 DIAGNOSIS — B1920 Unspecified viral hepatitis C without hepatic coma: Secondary | ICD-10-CM | POA: Diagnosis present

## 2014-10-20 DIAGNOSIS — B958 Unspecified staphylococcus as the cause of diseases classified elsewhere: Secondary | ICD-10-CM | POA: Diagnosis present

## 2014-10-20 DIAGNOSIS — L03116 Cellulitis of left lower limb: Secondary | ICD-10-CM | POA: Diagnosis present

## 2014-10-20 DIAGNOSIS — F332 Major depressive disorder, recurrent severe without psychotic features: Secondary | ICD-10-CM | POA: Diagnosis present

## 2014-10-20 DIAGNOSIS — M7730 Calcaneal spur, unspecified foot: Secondary | ICD-10-CM | POA: Diagnosis present

## 2014-10-20 DIAGNOSIS — B353 Tinea pedis: Secondary | ICD-10-CM | POA: Diagnosis present

## 2014-10-20 DIAGNOSIS — F17210 Nicotine dependence, cigarettes, uncomplicated: Secondary | ICD-10-CM | POA: Diagnosis present

## 2014-10-20 DIAGNOSIS — G894 Chronic pain syndrome: Secondary | ICD-10-CM | POA: Diagnosis present

## 2014-10-20 DIAGNOSIS — F1120 Opioid dependence, uncomplicated: Secondary | ICD-10-CM | POA: Diagnosis present

## 2014-10-20 DIAGNOSIS — F1920 Other psychoactive substance dependence, uncomplicated: Secondary | ICD-10-CM

## 2014-10-20 LAB — CREATININE, SERUM
Creatinine, Ser: 0.76 mg/dL (ref 0.50–1.35)
GFR calc non Af Amer: >90 mL/min (ref >90)

## 2014-10-20 LAB — CBC WITH DIFFERENTIAL/PLATELET
Basophils Absolute: 0 10*3/uL (ref 0.0–0.1)
Basophils Relative: 0 % (ref 0–1)
EOS PCT: 1 % (ref 0–5)
Eosinophils Absolute: 0.3 10*3/uL (ref 0.0–0.7)
HEMATOCRIT: 44.2 % (ref 39.0–52.0)
Hemoglobin: 15.3 g/dL (ref 13.0–17.0)
Lymphocytes Relative: 6 % — ABNORMAL LOW (ref 12–46)
Lymphs Abs: 1.7 10*3/uL (ref 0.7–4.0)
MCH: 32.3 pg (ref 26.0–34.0)
MCHC: 34.6 g/dL (ref 30.0–36.0)
MCV: 93.4 fL (ref 78.0–100.0)
Monocytes Absolute: 2 10*3/uL — ABNORMAL HIGH (ref 0.1–1.0)
Monocytes Relative: 7 % (ref 3–12)
NEUTROS ABS: 24.4 10*3/uL — AB (ref 1.7–7.7)
NEUTROS PCT: 86 % — AB (ref 43–77)
Platelets: 189 10*3/uL (ref 150–400)
RBC: 4.73 MIL/uL (ref 4.22–5.81)
RDW: 13.5 % (ref 11.5–15.5)
WBC: 28.4 10*3/uL — ABNORMAL HIGH (ref 4.0–10.5)

## 2014-10-20 LAB — SEDIMENTATION RATE: Sed Rate: 5 mm/hr (ref 0–16)

## 2014-10-20 LAB — COMPREHENSIVE METABOLIC PANEL
ALK PHOS: 79 U/L (ref 39–117)
ALT: 142 U/L — ABNORMAL HIGH (ref 0–53)
ANION GAP: 6 (ref 5–15)
AST: 62 U/L — ABNORMAL HIGH (ref 0–37)
Albumin: 4 g/dL (ref 3.5–5.2)
BILIRUBIN TOTAL: 0.9 mg/dL (ref 0.3–1.2)
BUN: 12 mg/dL (ref 6–23)
CHLORIDE: 105 meq/L (ref 96–112)
CO2: 26 mmol/L (ref 19–32)
CREATININE: 0.79 mg/dL (ref 0.50–1.35)
Calcium: 9 mg/dL (ref 8.4–10.5)
GFR calc non Af Amer: >90 mL/min (ref >90)
GLUCOSE: 131 mg/dL — AB (ref 70–99)
POTASSIUM: 3.9 mmol/L (ref 3.5–5.1)
Sodium: 137 mmol/L (ref 135–145)
Total Protein: 6.8 g/dL (ref 6.0–8.3)

## 2014-10-20 LAB — CBC
HCT: 46 % (ref 39.0–52.0)
Hemoglobin: 15.5 g/dL (ref 13.0–17.0)
MCH: 32 pg (ref 26.0–34.0)
MCHC: 33.7 g/dL (ref 30.0–36.0)
MCV: 95 fL (ref 78.0–100.0)
Platelets: 177 10*3/uL (ref 150–400)
RBC: 4.84 MIL/uL (ref 4.22–5.81)
RDW: 13.8 % (ref 11.5–15.5)
WBC: 22.7 10*3/uL — ABNORMAL HIGH (ref 4.0–10.5)

## 2014-10-20 LAB — LACTIC ACID, PLASMA: LACTIC ACID, VENOUS: 0.9 mmol/L (ref 0.5–2.2)

## 2014-10-20 MED ORDER — TRAMADOL HCL 50 MG PO TABS
50.0000 mg | ORAL_TABLET | Freq: Four times a day (QID) | ORAL | Status: DC | PRN
  Administered 2014-10-20 – 2014-10-23 (×7): 50 mg via ORAL
  Filled 2014-10-20 (×9): qty 1

## 2014-10-20 MED ORDER — IBUPROFEN 800 MG PO TABS
800.0000 mg | ORAL_TABLET | Freq: Three times a day (TID) | ORAL | Status: DC | PRN
  Administered 2014-10-20 – 2014-10-22 (×3): 800 mg via ORAL
  Filled 2014-10-20 (×3): qty 1

## 2014-10-20 MED ORDER — VANCOMYCIN HCL IN DEXTROSE 1-5 GM/200ML-% IV SOLN
1000.0000 mg | Freq: Three times a day (TID) | INTRAVENOUS | Status: DC
  Administered 2014-10-20 – 2014-10-23 (×8): 1000 mg via INTRAVENOUS
  Filled 2014-10-20 (×9): qty 200

## 2014-10-20 MED ORDER — CEFTRIAXONE SODIUM IN DEXTROSE 20 MG/ML IV SOLN
1.0000 g | INTRAVENOUS | Status: DC
  Administered 2014-10-21: 1 g via INTRAVENOUS
  Filled 2014-10-20: qty 50

## 2014-10-20 MED ORDER — SERTRALINE HCL 100 MG PO TABS
100.0000 mg | ORAL_TABLET | Freq: Every day | ORAL | Status: DC
  Administered 2014-10-21 – 2014-10-23 (×3): 100 mg via ORAL
  Filled 2014-10-20 (×3): qty 1

## 2014-10-20 MED ORDER — ONDANSETRON HCL 4 MG/2ML IJ SOLN: 4.0000 mg | Freq: Four times a day (QID) | INTRAMUSCULAR | Status: DC | PRN

## 2014-10-20 MED ORDER — NICOTINE 21 MG/24HR TD PT24
21.0000 mg | MEDICATED_PATCH | Freq: Every day | TRANSDERMAL | Status: DC
  Administered 2014-10-20 – 2014-10-23 (×4): 21 mg via TRANSDERMAL
  Filled 2014-10-20 (×4): qty 1

## 2014-10-20 MED ORDER — DEXTROSE 5 % IV SOLN
1.0000 g | Freq: Once | INTRAVENOUS | Status: AC
  Administered 2014-10-20: 1 g via INTRAVENOUS

## 2014-10-20 MED ORDER — TRAZODONE HCL 100 MG PO TABS
100.0000 mg | ORAL_TABLET | Freq: Every day | ORAL | Status: DC
  Administered 2014-10-20 – 2014-10-22 (×3): 100 mg via ORAL
  Filled 2014-10-20: qty 1
  Filled 2014-10-20: qty 2
  Filled 2014-10-20 (×3): qty 1

## 2014-10-20 MED ORDER — GABAPENTIN 400 MG PO CAPS
800.0000 mg | ORAL_CAPSULE | Freq: Four times a day (QID) | ORAL | Status: DC
  Administered 2014-10-20 – 2014-10-23 (×10): 800 mg via ORAL
  Filled 2014-10-20 (×13): qty 2

## 2014-10-20 MED ORDER — METHOCARBAMOL 500 MG PO TABS
500.0000 mg | ORAL_TABLET | Freq: Three times a day (TID) | ORAL | Status: DC | PRN
Start: 2014-10-20 — End: 2014-10-23
  Administered 2014-10-20 – 2014-10-23 (×6): 500 mg via ORAL
  Filled 2014-10-20 (×8): qty 1

## 2014-10-20 MED ORDER — BUPROPION HCL ER (XL) 300 MG PO TB24
300.0000 mg | ORAL_TABLET | Freq: Every day | ORAL | Status: DC
  Administered 2014-10-21 – 2014-10-23 (×3): 300 mg via ORAL
  Filled 2014-10-20 (×3): qty 1

## 2014-10-20 MED ORDER — ENOXAPARIN SODIUM 40 MG/0.4ML ~~LOC~~ SOLN
40.0000 mg | SUBCUTANEOUS | Status: DC
  Administered 2014-10-20 – 2014-10-22 (×3): 40 mg via SUBCUTANEOUS
  Filled 2014-10-20 (×4): qty 0.4

## 2014-10-20 MED ORDER — DOCUSATE SODIUM 100 MG PO CAPS
100.0000 mg | ORAL_CAPSULE | Freq: Two times a day (BID) | ORAL | Status: DC
  Administered 2014-10-20 – 2014-10-23 (×6): 100 mg via ORAL
  Filled 2014-10-20 (×3): qty 1

## 2014-10-20 MED ORDER — HYDROXYZINE HCL 25 MG PO TABS
25.0000 mg | ORAL_TABLET | Freq: Four times a day (QID) | ORAL | Status: DC | PRN
  Administered 2014-10-20 – 2014-10-22 (×6): 25 mg via ORAL
  Filled 2014-10-20 (×8): qty 1

## 2014-10-20 MED ORDER — ONDANSETRON HCL 4 MG PO TABS: 4.0000 mg | ORAL_TABLET | Freq: Four times a day (QID) | ORAL | Status: DC | PRN

## 2014-10-20 MED ORDER — ONDANSETRON HCL 4 MG/2ML IJ SOLN: 4.0000 mg | Freq: Three times a day (TID) | INTRAMUSCULAR | Status: AC | PRN

## 2014-10-20 MED ORDER — CEFTRIAXONE SODIUM IN DEXTROSE 20 MG/ML IV SOLN
1.0000 g | INTRAVENOUS | Status: DC
  Filled 2014-10-20: qty 50

## 2014-10-20 MED ORDER — CEFTRIAXONE SODIUM 1 G IJ SOLR
INTRAMUSCULAR | Status: AC
  Filled 2014-10-20: qty 10

## 2014-10-20 MED ORDER — SODIUM CHLORIDE 0.9 % IV SOLN
INTRAVENOUS | Status: AC
  Administered 2014-10-20: 20:00:00 via INTRAVENOUS

## 2014-10-20 NOTE — ED Provider Notes (Signed)
CSN: [id number]     Arrival date & time 10/20/14  1447 History   First MD Initiated Contact with Patient 10/20/14 1507     Chief Complaint  Patient presents with  . Follow-up  . Cellulitis      HPI Patient has history of cellulitis of lower extremity.  Was seen here a few days ago diagnosis cellulitis and started on clindamycin.  Took his last clindamycin today.  Said the redness and pain seemed to increase quite dramatically this afternoon.  Patient had a negative venous Doppler done a few days ago.  He did have an elevated white count on the 23rd. Past Medical History  Diagnosis Date  . Bipolar disorder   . Hepatitis C   . Depression   . Cellulitis    Past Surgical History  Procedure Laterality Date  . Back surgery    . Shoulder surgery     Family History  Problem Relation Age of Onset  . Alcohol abuse Father   . Cancer Maternal Aunt    History  Substance Use Topics  . Smoking status: Current Every Day Smoker -- 1.50 packs/day    Types: Cigarettes    Start date: 10/22/1981  . Smokeless tobacco: Never Used  . Alcohol Use: Yes     Comment: pt states approx a case a day    Review of Systems  All other systems reviewed and are negative  Allergies  Lithium and Penicillins  Home Medications   Prior to Admission medications   Medication Sig Start Date End Date Taking? Authorizing Provider  buPROPion (WELLBUTRIN XL) 300 MG 24 hr tablet Take 1 tablet (300 mg total) by mouth daily. 09/14/14  Yes Velna Hatchet May Agustin, NP  gabapentin (NEURONTIN) 400 MG capsule Take 2 capsules (800 mg total) by mouth 4 (four) times daily. 09/13/14  Yes Velna Hatchet May Agustin, NP  hydrOXYzine (ATARAX/VISTARIL) 25 MG tablet Take 1 tablet (25 mg total) by mouth every 6 (six) hours as needed for anxiety. 09/13/14  Yes Velna Hatchet May Agustin, NP  Multiple Vitamin (MULTIVITAMIN WITH MINERALS) TABS tablet Take 1 tablet by mouth daily.   Yes Historical Provider, MD  sertraline (ZOLOFT) 100 MG tablet Take 1  tablet (100 mg total) by mouth daily. 09/14/14  Yes Velna Hatchet May Agustin, NP  traZODone (DESYREL) 100 MG tablet Take 100 mg by mouth at bedtime.   Yes Historical Provider, MD  fluticasone (FLONASE) 50 MCG/ACT nasal spray Place 2 sprays into both nostrils daily. 10/25/14   Ambrose Finland, NP  furosemide (LASIX) 20 MG tablet Take 1 tablet (20 mg total) by mouth daily. 10/25/14   Ambrose Finland, NP  ibuprofen (ADVIL,MOTRIN) 800 MG tablet Take 1 tablet (800 mg total) by mouth every 8 (eight) hours as needed for mild pain or moderate pain. 10/23/14   Rodolph Bong, MD  methocarbamol (ROBAXIN) 500 MG tablet Take 1 tablet (500 mg total) by mouth every 8 (eight) hours as needed for muscle spasms. 10/23/14   Rodolph Bong, MD  nicotine (NICODERM CQ - DOSED IN MG/24 HOURS) 21 mg/24hr patch Place 1 patch (21 mg total) onto the skin daily. Patient not taking: Reported on 10/25/2014 10/23/14   Rodolph Bong, MD  pantoprazole (PROTONIX) 40 MG tablet Take 1 tablet (40 mg total) by mouth daily at 6 (six) AM. 10/23/14   Rodolph Bong, MD  sulfamethoxazole-trimethoprim (BACTRIM DS,SEPTRA DS) 800-160 MG per tablet Take 2 tablets by mouth every 12 (twelve) hours. TAKE FOR 8 DAYS THEN  STOP. 10/23/14   Rodolph Bonganiel Thompson V, MD   BP 111/62 mmHg  Pulse 61  Temp(Src) 98.3 F (36.8 C) (Oral)  Resp 16  Ht 5\' 11"  (1.803 m)  Wt 220 lb (99.791 kg)  BMI 30.70 kg/m2  SpO2 94% Physical Exam Physical Exam  Nursing note and vitals reviewed. Constitutional: He is oriented to person, place, and time. He appears well-developed and well-nourished. No distress.  HENT:  Head: Normocephalic and atraumatic.  Eyes: Pupils are equal, round, and reactive to light.  Neck: Normal range of motion.  Cardiovascular: Normal rate and intact distal pulses.   Pulmonary/Chest: No respiratory distress.  Abdominal: Normal appearance. He exhibits no distension.  Musculoskeletal: Normal range of motion.  left lower extremity with significant  cellulitis from knee down to ankle and foot. Neurological: He is alert and oriented to person, place, and time. No cranial nerve deficit.  Skin: Skin is warm and dry. No rash noted.  Psychiatric: He has a normal mood and affect. His behavior is normal.   ED Course  Procedures (including critical care time)  Medications  ondansetron (ZOFRAN) injection 4 mg (not administered)  0.9 %  sodium chloride infusion ( Intravenous Stopped 10/21/14 1046)  cefTRIAXone (ROCEPHIN) 1 g in dextrose 5 % 50 mL IVPB (0 g Intravenous Stopped 10/20/14 1659)  furosemide (LASIX) injection 40 mg (40 mg Intravenous Given 10/22/14 2124)    Labs Review Labs Reviewed  CBC WITH DIFFERENTIAL - Abnormal; Notable for the following:    WBC 28.4 (*)    Neutrophils Relative % 86 (*)    Lymphocytes Relative 6 (*)    Neutro Abs 24.4 (*)    Monocytes Absolute 2.0 (*)    All other components within normal limits  COMPREHENSIVE METABOLIC PANEL - Abnormal; Notable for the following:    Glucose, Bld 131 (*)    AST 62 (*)    ALT 142 (*)    All other components within normal limits  COMPREHENSIVE METABOLIC PANEL - Abnormal; Notable for the following:    Glucose, Bld 128 (*)    Total Protein 5.9 (*)    Albumin 3.3 (*)    AST 48 (*)    ALT 116 (*)    All other components within normal limits  CBC - Abnormal; Notable for the following:    WBC 17.8 (*)    All other components within normal limits  CBC - Abnormal; Notable for the following:    WBC 22.7 (*)    All other components within normal limits  HEMOGLOBIN A1C - Abnormal; Notable for the following:    Hgb A1c MFr Bld 6.0 (*)    Mean Plasma Glucose 126 (*)    All other components within normal limits  C-REACTIVE PROTEIN - Abnormal; Notable for the following:    CRP 5.7 (*)    All other components within normal limits  VANCOMYCIN, TROUGH - Abnormal; Notable for the following:    Vancomycin Tr 9.7 (*)    All other components within normal limits  BASIC METABOLIC  PANEL - Abnormal; Notable for the following:    Glucose, Bld 129 (*)    Calcium 8.3 (*)    All other components within normal limits  CBC - Abnormal; Notable for the following:    Platelets 143 (*)    All other components within normal limits  CBC - Abnormal; Notable for the following:    Platelets 143 (*)    All other components within normal limits  BASIC METABOLIC PANEL -  Abnormal; Notable for the following:    Glucose, Bld 112 (*)    All other components within normal limits  CULTURE, BLOOD (ROUTINE X 2)  CULTURE, BLOOD (ROUTINE X 2)  MAGNESIUM  PHOSPHORUS  CREATININE, SERUM  LACTIC ACID, PLASMA  SEDIMENTATION RATE  HIV ANTIBODY (ROUTINE TESTING)    Imaging Review    DG Tibia/Fibula Left (Final result) Result time: 10/20/14 [id number]   Final result by Rad Results In Interface (10/20/14 20:36:28)   Narrative:   CLINICAL DATA: Follow-up cellulitis  EXAM: LEFT TIBIA AND FIBULA - 2 VIEW  COMPARISON: None.  FINDINGS: There is no evidence of fracture or other focal bone lesions. Generalized soft tissue edema in the left lower leg.  IMPRESSION: No acute osseous abnormality of the left tibia/ fibula.   Electronically Signed By: Elige KoHetal Patel On: 10/20/2014 20:36          DG Ankle 2 Views Left (Final result) Result time: 10/20/14 20:23:12   Final result by Rad Results In Interface (10/20/14 20:23:12)   Narrative:   CLINICAL DATA: Follow-up cellulitis  EXAM: LEFT ANKLE - 2 VIEW  COMPARISON: None.  FINDINGS: There is no evidence of fracture, dislocation, or joint effusion. There is a small plantar calcaneal spur. Mild soft tissue edema around the knee ankle and lower leg.  IMPRESSION: No acute osseous abnormality of the left ankle.   Electronically Signed By: Elige KoHetal Patel On: 10/20/2014 20:23       MDM   Final diagnoses:  Cellulitis of left lower extremity         Nelia Shiobert L Catlyn Shipton, MD 10/31/14 1909

## 2014-10-20 NOTE — ED Notes (Signed)
PT FROM [name]:  Pt seen here one week ago for cellulits of left leg.  Pt states he has finished antibiotics but not improved.  Pt c/o headache.

## 2014-10-20 NOTE — Progress Notes (Addendum)
ANTIBIOTIC CONSULT NOTE - INITIAL  Pharmacy Consult for:  Vancomycin Indication:  Cellulitis of left leg  Allergies  Allergen Reactions  . Lithium Anaphylaxis  . Penicillins Rash    Patient Measurements: Height 71 inches Weight: 220 lb (99.791 kg)    Adjusted Body Weight: 82.6 kg  Vital Signs: Temp: 97.9 F (36.6 C) (12/30 1816) Temp Source: Oral (12/30 1816) BP: 118/78 mmHg (12/30 1816) Pulse Rate: 101 (12/30 1816)  Labs:  Recent Labs  10/20/14 1527  WBC 28.4*  HGB 15.3  PLT 189  CREATININE 0.79   Estimated Creatinine Clearance: 153.7 mL/min (by C-G formula based on Cr of 0.79).   Microbiology: Blood cultures are pending  Medical History: Past Medical History  Diagnosis Date  . Bipolar disorder   . Hepatitis C   . Depression   . Cellulitis     Medications:  Scheduled:  . [START ON 10/21/2014] buPROPion  300 mg Oral Daily  . cefTRIAXone (ROCEPHIN)  IV  1 g Intravenous Q24H  . docusate sodium  100 mg Oral BID  . enoxaparin (LOVENOX) injection  40 mg Subcutaneous Q24H  . gabapentin  800 mg Oral QID  . nicotine  21 mg Transdermal Daily  . [START ON 10/21/2014] sertraline  100 mg Oral Daily  . traZODone  100 mg Oral QHS    Assessment:  Assisting with Vancomycin therapy for this 38 year-old male with recurrent cellulitis of left leg.  Rocephin has also been ordered.  The medical record indicates that patient has completed a 7-day course of clindamycin, and this morning he had chills, pain all over, and worsening redness in left leg.  Goals of Therapy:   Eradication of infection  Vancomycin trough levels 10-15 mcg/ml  Plan:   Vancomycin 1000 mg IV every 8 hours  Levels as needed to guide dosing  Follow for culture results  Polo Rileylaire Hanan Mcwilliams R.Ph. 10/20/2014,8:22 PM

## 2014-10-20 NOTE — Progress Notes (Signed)
Accept[facility] a 38 [facility] man for inpatient treatment of cellulitis.  Failed outpatient tx w/ clindamycin.  WBC 28.4.  Med-surg bed requested.  [name] 10/20/2014 5:07 PM

## 2014-10-20 NOTE — H&P (Signed)
PCP:  Holland Commons, NP    Chief Complaint:  Left leg pain and swelling  HPI: [name] [name] is a 38 y.o. male   has a past medical history of Bipolar disorder; Hepatitis C; Depression; and Cellulitis.   Presented with pain and swelling of left leg x 7 days. He has had recurent episodes affecting same leg.  Patietn was admitted last time in August 2015 with similar episode and responded well to vanc and rocephin. Blood cultures were negative. He was discharged on septra.  He has hx of Hep C. HIV negative as of 6/15. Patient is currently staying at [facility] for drug abuse.  Denies any chills, body aches. He was seen at [facility] on 12/23 started on clindamycin  took it for 7 days and had some improvement. This morning he woke up with chills, pain all over and worsening redness in left leg. He states he was tested for DVT last week ago and it was negative.  Patient presented to [facility] and was found to have WBC 28. He was transferred to [facility] med[facility].  Patient reports headache since this AM.   Hospitalist was called for admission for Recurrent Left leg cellulitis  Review of Systems:    Pertinent positives include: leg pain, erythema ,swelling, headaches,  Constitutional:  No weight loss, night sweats, Fevers, chills, fatigue, weight loss  HEENT:  No Difficulty swallowing,Tooth/dental problems,Sore throat,  No sneezing, itching, ear ache, nasal congestion, post nasal drip,  Cardio-vascular:  No chest pain, Orthopnea, PND, anasarca, dizziness, palpitations.no Bilateral lower extremity swelling  GI:  No heartburn, indigestion, abdominal pain, nausea, vomiting, diarrhea, change in bowel habits, loss of appetite, melena, blood in stool, hematemesis Resp:  no shortness of breath at rest. No dyspnea on exertion, No excess mucus, no productive cough, No non-productive cough, No coughing up of blood.No change in color of mucus.No wheezing. Skin:  no rash or lesions. No jaundice GU:  no dysuria,  change in color of urine, no urgency or frequency. No straining to urinate.  No flank pain.  Musculoskeletal:  No joint pain or no joint swelling. No decreased range of motion. No back pain.  Psych:  No change in mood or affect. No depression or anxiety. No memory loss.  Neuro: no localizing neurological complaints, no tingling, no weakness, no double vision, no gait abnormality, no slurred speech, no confusion  Otherwise ROS are negative except for above, 10 systems were reviewed  Past Medical History: Past Medical History  Diagnosis Date  . Bipolar disorder   . Hepatitis C   . Depression   . Cellulitis    Past Surgical History  Procedure Laterality Date  . Back surgery    . Shoulder surgery       Medications: Prior to Admission medications   Medication Sig Start Date End Date Taking? Authorizing Provider  buPROPion (WELLBUTRIN XL) 300 MG 24 hr tablet Take 1 tablet (300 mg total) by mouth daily. 09/14/14  Yes Velna Hatchet May Agustin, NP  gabapentin (NEURONTIN) 400 MG capsule Take 2 capsules (800 mg total) by mouth 4 (four) times daily. 09/13/14  Yes Velna Hatchet May Agustin, NP  hydrOXYzine (ATARAX/VISTARIL) 25 MG tablet Take 1 tablet (25 mg total) by mouth every 6 (six) hours as needed for anxiety. 09/13/14  Yes Velna Hatchet May Agustin, NP  ibuprofen (ADVIL,MOTRIN) 800 MG tablet Take 1 tablet (800 mg total) by mouth every 8 (eight) hours as needed for mild pain or moderate pain. 09/17/14  Yes Velna Hatchet May Agustin,  NP  methocarbamol (ROBAXIN) 500 MG tablet Take 1 tablet (500 mg total) by mouth every 8 (eight) hours as needed for muscle spasms. 09/13/14  Yes Velna Hatchet May Agustin, NP  Multiple Vitamin (MULTIVITAMIN WITH MINERALS) TABS tablet Take 1 tablet by mouth daily.   Yes Historical Provider, MD  sertraline (ZOLOFT) 100 MG tablet Take 1 tablet (100 mg total) by mouth daily. 09/14/14  Yes Velna Hatchet May Agustin, NP  traZODone (DESYREL) 100 MG tablet Take 100 mg by mouth at bedtime.   Yes Historical  Provider, MD  clindamycin (CLEOCIN) 300 MG capsule Take 1 capsule (300 mg total) by mouth 4 (four) times daily. X 7 days Patient not taking: Reported on 10/20/2014 10/13/14   Nada Boozer Hess, PA-C  lidocaine (LIDODERM) 5 % Place 1 patch onto the skin daily. Remove & Discard patch within 12 hours or as directed by MD Patient not taking: Reported on 10/20/2014 09/13/14   [facility], NP  traZODone (DESYREL) 50 MG tablet Take 1 tablet (50 mg total) by mouth at bedtime and may repeat dose one time if needed. Patient not taking: Reported on 10/20/2014 09/13/14   Lindwood Qua, NP    Allergies:   Allergies  Allergen Reactions  . Lithium Anaphylaxis  . Penicillins Rash    Social History:  Ambulatory   independently     From facility [name], rehab for hx of opioid drug abuse   reports that he has been smoking Cigarettes.  He started smoking about 33 years ago. He has been smoking about 1.50 packs per day. He has never used smokeless tobacco. He reports that he drinks alcohol. He reports that he uses illicit drugs (Marijuana, Benzodiazepines, Other-see comments, and Methamphetamines).    Family History: family history includes Alcohol abuse in his father; Cancer in his maternal aunt.    Physical Exam: Patient Vitals for the past 24 hrs:  BP Temp Temp src Pulse Resp SpO2 Weight  10/20/14 1816 118/78 mmHg 97.9 F (36.6 C) Oral (!) 101 18 96 % -  10/20/14 1641 129/63 mmHg - - 103 18 96 % -  10/20/14 1451 139/81 mmHg 98.5 F (36.9 C) - 114 16 97 % 99.791 kg (220 lb)    1. General:  in No Acute distress 2. Psychological: Alert and  Oriented 3. Head/ENT:   Moist   Mucous Membranes                          Head Non traumatic, neck supple                          Normal  Dentition 4. SKIN:  decreased Skin turgor,  Skin clean Dry redness of left lower ext.  5. Heart: Regular rate and rhythm no Murmur, Rub or gallop 6. Lungs: Clear to auscultation bilaterally, no wheezes or  crackles   7. Abdomen: Soft, non-tender, Non distended 8. Lower extremities: no clubbing, cyanosis,   Edema left >right 9. Neurologically Grossly intact, moving all 4 extremities equally 10. MSK: Normal range of motion  body mass index is 30.7 kg/(m^2).   Labs on Admission:   Results for orders placed or performed during the hospital encounter of 10/20/14 (from the past 24 hour(s))  CBC with Differential     Status: Abnormal   Collection Time: 10/20/14  3:27 PM  Result Value Ref Range   WBC 28.4 (H) 4.0 - 10.5 K/uL   RBC  4.73 4.22 - 5.81 MIL/uL   Hemoglobin 15.3 13.0 - 17.0 g/dL   HCT 09.644.2 04.539.0 - 40.952.0 %   MCV 93.4 78.0 - 100.0 fL   MCH 32.3 26.0 - 34.0 pg   MCHC 34.6 30.0 - 36.0 g/dL   RDW 81.113.5 91.411.5 - 78.215.5 %   Platelets 189 150 - 400 K/uL   Neutrophils Relative % 86 (H) 43 - 77 %   Lymphocytes Relative 6 (L) 12 - 46 %   Monocytes Relative 7 3 - 12 %   Eosinophils Relative 1 0 - 5 %   Basophils Relative 0 0 - 1 %   Neutro Abs 24.4 (H) 1.7 - 7.7 K/uL   Lymphs Abs 1.7 0.7 - 4.0 K/uL   Monocytes Absolute 2.0 (H) 0.1 - 1.0 K/uL   Eosinophils Absolute 0.3 0.0 - 0.7 K/uL   Basophils Absolute 0.0 0.0 - 0.1 K/uL  Comprehensive metabolic panel     Status: Abnormal   Collection Time: 10/20/14  3:27 PM  Result Value Ref Range   Sodium 137 135 - 145 mmol/L   Potassium 3.9 3.5 - 5.1 mmol/L   Chloride 105 96 - 112 mEq/L   CO2 26 19 - 32 mmol/L   Glucose, Bld 131 (H) 70 - 99 mg/dL   BUN 12 6 - 23 mg/dL   Creatinine, Ser 9.560.79 0.50 - 1.35 mg/dL   Calcium 9.0 8.4 - 21.310.5 mg/dL   Total Protein 6.8 6.0 - 8.3 g/dL   Albumin 4.0 3.5 - 5.2 g/dL   AST 62 (H) 0 - 37 U/L   ALT 142 (H) 0 - 53 U/L   Alkaline Phosphatase 79 39 - 117 U/L   Total Bilirubin 0.9 0.3 - 1.2 mg/dL   GFR calc non Af Amer >90 >90 mL/min   GFR calc Af Amer >90 >90 mL/min   Anion gap 6 5 - 15    UA not obtained  No results found for: HGBA1C  Estimated Creatinine Clearance: 153.7 mL/min (by C-G formula based on Cr of  0.79).  BNP (last 3 results) No results for input(s): PROBNP in the last 8760 hours.  Other results:  I have pearsonaly reviewed this: ECG not obtained  Filed Weights   10/20/14 1451  Weight: 99.791 kg (220 lb)    Cultures:    Component Value Date/Time   SDES BLOOD RIGHT HAND  5 ML IN EACH BOTTLE 05/30/2014 1006   SDES BLOOD LEFT HAND  5 ML IN EACH BOTTLE 05/30/2014 1006   SPECREQUEST NONE 05/30/2014 1006   SPECREQUEST NONE 05/30/2014 1006   CULT  05/30/2014 1006    NO GROWTH 5 DAYS [facility]   CULT  05/30/2014 1006    NO GROWTH 5 DAYS [facility]formed at [facility]   REPTSTATUS 06/05/2014 FINAL 05/30/2014 1006   REPTSTATUS 06/05/2014 FINAL 05/30/2014 1006     Radiological Exams on Admission: No results found.  Chart has been reviewed  Assessment/Plan  38 yo M with hx of recurrent Left leg cellulitis here with another episode.   Present on Admission:  . Cellulitis - as patient has responded in the past will treat with vanc and rocephin for broad coverage, obtain blood culture, plain imaging, Sed rate and CRP. ID consult in AM, check HIV status and HgA1C . Chronic pain syndrome - avoid narcotics . Hepatitis C virus infection without hepatic coma  - stable, obtain CMET . Polysubstance (including opioids) dependence with physiological dependence - last ETOH  use 42 days ago . [facility] will need to go back there . Tobacco dependence - nicotine patch   Prophylaxis:  Lovenox, Protonix  CODE STATUS:  FULL CODE   Other plan as per orders.  I have spent a total of 55 min on this admission  Maille Halliwell 10/20/2014, 7:10 PM  [facility]  Pager [phone]   after 2 AM please page floor coverage PA If 7AM-7PM, please contact the day team taking care of the patient  [email or web address]  Password [email or web address]

## 2014-10-21 DIAGNOSIS — A419 Sepsis, unspecified organism: Principal | ICD-10-CM

## 2014-10-21 DIAGNOSIS — B182 Chronic viral hepatitis C: Secondary | ICD-10-CM

## 2014-10-21 LAB — COMPREHENSIVE METABOLIC PANEL
ALT: 116 U/L — ABNORMAL HIGH (ref 0–53)
ANION GAP: 6 (ref 5–15)
AST: 48 U/L — AB (ref 0–37)
Albumin: 3.3 g/dL — ABNORMAL LOW (ref 3.5–5.2)
Alkaline Phosphatase: 69 U/L (ref 39–117)
BUN: 11 mg/dL (ref 6–23)
CALCIUM: 8.4 mg/dL (ref 8.4–10.5)
CO2: 26 mmol/L (ref 19–32)
Chloride: 105 mEq/L (ref 96–112)
Creatinine, Ser: 0.65 mg/dL (ref 0.50–1.35)
GFR calc non Af Amer: >90 mL/min (ref >90)
Glucose, Bld: 128 mg/dL — ABNORMAL HIGH (ref 70–99)
POTASSIUM: 3.5 mmol/L (ref 3.5–5.1)
Sodium: 137 mmol/L (ref 135–145)
TOTAL PROTEIN: 5.9 g/dL — AB (ref 6.0–8.3)
Total Bilirubin: 0.7 mg/dL (ref 0.3–1.2)

## 2014-10-21 LAB — CBC
HCT: 42.1 % (ref 39.0–52.0)
HEMOGLOBIN: 14.2 g/dL (ref 13.0–17.0)
MCH: 31.8 pg (ref 26.0–34.0)
MCHC: 33.7 g/dL (ref 30.0–36.0)
MCV: 94.4 fL (ref 78.0–100.0)
Platelets: UNDETERMINED 10*3/uL (ref 150–400)
RBC: 4.46 MIL/uL (ref 4.22–5.81)
RDW: 13.8 % (ref 11.5–15.5)
WBC: 17.8 10*3/uL — ABNORMAL HIGH (ref 4.0–10.5)

## 2014-10-21 LAB — HEMOGLOBIN A1C
Hgb A1c MFr Bld: 6 % — ABNORMAL HIGH (ref <5.7)
Mean Plasma Glucose: 126 mg/dL — ABNORMAL HIGH (ref <117)

## 2014-10-21 LAB — HIV ANTIBODY (ROUTINE TESTING W REFLEX): HIV 1&2 Ab, 4th Generation: NONREACTIVE

## 2014-10-21 LAB — PHOSPHORUS: PHOSPHORUS: 4.1 mg/dL (ref 2.3–4.6)

## 2014-10-21 LAB — C-REACTIVE PROTEIN: CRP: 5.7 mg/dL — ABNORMAL HIGH (ref <0.60)

## 2014-10-21 LAB — MAGNESIUM: Magnesium: 1.9 mg/dL (ref 1.5–2.5)

## 2014-10-21 MED ORDER — SODIUM CHLORIDE 0.9 % IV SOLN
INTRAVENOUS | Status: DC
  Administered 2014-10-21: 11:00:00 via INTRAVENOUS

## 2014-10-21 NOTE — Progress Notes (Signed)
PROGRESS NOTE  [name] [name]:[id number] DOB: [birth year] DOA: 10/20/2014 PCP: Holland Commons, NP  Brief history 38 year old male with a history of chronic hepatitis C, cellulitis, bipolar disorder, and substance induced mood disorder presents with one-week history of increasing left lower extremity pain, edema, and erythema. The patient went to the emergency department on 10/13/2014 and was discharged with clindamycin. He endorses compliance. Even with oral clindamycin, the patient continued to have increasing pain and erythema. The patient denies any recent injuries or trauma. He has had 2 admissions for cellulitis after which she was discharged on 06/02/2014 and 02/06/2014. The patient was noted to have WBC 28.4 and tachycardia with heart rate up to 114 in the emergency department. He was started on ceftriaxone and vancomycin IV. Assessment/Plan: Sepsis -Secondary to cellulitis -Continue IV antibiotics -WBC improving Cellulitis left lower extremity -this is the patient's third hospitalization in the past 8 months -Etiology is likely due to the patient's struggle with tinea pedis -Provided education on keeping his feet dry and changing socks  -Discontinue ceftriaxone as this is most likely a strep/staphylococcal infection  -Continue intravenous vancomycin  -If continues to improve clinically, plan to discharge with Bactrim DS, 2 tablets bid to finish 10 days of therapy -Duplex left lower extremity r/o DVT Chronic hepatitis C/Transaminasemia -[facility]  Bipolar disorder  -Continue Zoloft and trazodone and Wellbutrin  Tobacco abuse -Tobacco cessation discussed -NicoDerm patch  Family Communication:   Pt at beside Disposition Plan:  [name] in 1-2 days        Procedures/Studies: Dg Tibia/fibula Left  10/20/2014   CLINICAL DATA:  Follow-up cellulitis  EXAM: LEFT TIBIA AND FIBULA - 2 VIEW  COMPARISON:  None.  FINDINGS:  There is no evidence of fracture or other focal bone lesions. Generalized soft tissue edema in the left lower leg.  IMPRESSION: No acute osseous abnormality of the left tibia/ fibula.   Electronically Signed   By: Elige Ko   On: 10/20/2014 20:36   Dg Ankle 2 Views Left  10/20/2014   CLINICAL DATA:  Follow-up cellulitis  EXAM: LEFT ANKLE - 2 VIEW  COMPARISON:  None.  FINDINGS: There is no evidence of fracture, dislocation, or joint effusion. There is a small plantar calcaneal spur. Mild soft tissue edema around the knee ankle and lower leg.  IMPRESSION: No acute osseous abnormality of the left ankle.   Electronically Signed   By: Elige Ko   On: 10/20/2014 20:23   US Venous Img Lower Unilateral Left  10/13/2014   CLINICAL DATA:  Left lower extremity redness and swelling for 3 days  EXAM: LEFT LOWER EXTREMITY VENOUS DUPLEX ULTRASOUND  TECHNIQUE: Gray-scale sonography with graded compression, as well as color Doppler and duplex ultrasound were performed to evaluate the left lower extremity deep venous system from the level of the common femoral vein and including the common femoral, femoral, profunda femoral, popliteal and calf veins including the posterior tibial, peroneal and gastrocnemius veins when visible. The superficial great saphenous vein was also interrogated. Spectral Doppler was utilized to evaluate flow at rest and with distal augmentation maneuvers in the common femoral, femoral and popliteal veins.  COMPARISON:  None.  FINDINGS: Contralateral Common Femoral Vein: Respiratory phasicity is normal and symmetric with the symptomatic side. No evidence of thrombus. Normal compressibility.  Common Femoral Vein: No evidence of thrombus. Normal compressibility, respiratory phasicity and response to augmentation.  Saphenofemoral Junction: No evidence  of thrombus. Normal compressibility and flow on color Doppler imaging.  Profunda Femoral Vein: No evidence of thrombus. Normal compressibility and flow  on color Doppler imaging.  Femoral Vein: No evidence of thrombus. Normal compressibility, respiratory phasicity and response to augmentation.  Popliteal Vein: No evidence of thrombus. Normal compressibility, respiratory phasicity and response to augmentation.  Calf Veins: No evidence of thrombus. Normal compressibility and flow on color Doppler imaging.  Superficial Great Saphenous Vein: No evidence of thrombus. Normal compressibility and flow on color Doppler imaging.  Venous Reflux:  None.  Other Findings:  There is left calf region edema.  IMPRESSION: No evidence of left lower extremity deep venous thrombosis. The right common femoral vein also patent. There is left calf region soft tissue edema.   Electronically Signed   By: Bretta BangWilliam  Woodruff M.D.   On: 10/13/2014 15:05         Subjective: Patient denies fevers, chills, headache, chest pain, dyspnea, nausea, vomiting, diarrhea, abdominal pain, dysuria, hematuria   Objective: Filed Vitals:   10/20/14 2115 10/21/14 0230 10/21/14 0549 10/21/14 1334  BP: 156/67 108/56 107/52 136/68  Pulse: 96 80 68 89  Temp: 99.4 F (37.4 C) 98.3 F (36.8 C) 98 F (36.7 C) 98.2 F (36.8 C)  TempSrc: Oral Oral Oral   Resp: 16 16 16 16   Height:   5\' 11"  (1.803 m)   Weight:   99.791 kg (220 lb)   SpO2: 98% 98% 98% 96%    Intake/Output Summary (Last 24 hours) at 10/21/14 1750 Last data filed at 10/21/14 1350  Gross per 24 hour  Intake 2129.83 ml  Output    200 ml  Net 1929.83 ml   Weight change:  Exam:   General:  Pt is alert, follows commands appropriately, not in acute distress  HEENT: No icterus, No thrush, No neck mass, [place]/AT  Cardiovascular: RRR, S1/S2, no rubs, no gallops  Respiratory: CTA bilaterally, no wheezing, no crackles, no rhonchi  Abdomen: Soft/+BS, non tender, non distended, no guarding  Extremities: Left lower extremity erythema and edema starting from the patient's medial thigh to the ankle. There are no draining  wounds. There is no crepitance.   Data Reviewed: Basic Metabolic Panel:  Recent Labs Lab 10/20/14 1527 10/20/14 1950 10/21/14 0435  NA 137  --  137  K 3.9  --  3.5  CL 105  --  105  CO2 26  --  26  GLUCOSE 131*  --  128*  BUN 12  --  11  CREATININE 0.79 0.76 0.65  CALCIUM 9.0  --  8.4  MG  --   --  1.9  PHOS  --   --  4.1   Liver Function Tests:  Recent Labs Lab 10/20/14 1527 10/21/14 0435  AST 62* 48*  ALT 142* 116*  ALKPHOS 79 69  BILITOT 0.9 0.7  PROT 6.8 5.9*  ALBUMIN 4.0 3.3*   No results for input(s): LIPASE, AMYLASE in the last 168 hours. No results for input(s): AMMONIA in the last 168 hours. CBC:  Recent Labs Lab 10/20/14 1527 10/20/14 1950 10/21/14 0435  WBC 28.4* 22.7* 17.8*  NEUTROABS 24.4*  --   --   HGB 15.3 15.5 14.2  HCT 44.2 46.0 42.1  MCV 93.4 95.0 94.4  PLT 189 177 PLATELET CLUMPS NOTED ON SMEAR, UNABLE TO ESTIMATE   Cardiac Enzymes: No results for input(s): CKTOTAL, CKMB, CKMBINDEX, TROPONINI in the last 168 hours. BNP: Invalid input(s): POCBNP CBG: No results for input(s): GLUCAP  in the last 168 hours.  No results found for this or any previous visit (from the past 240 hour(s)).   Scheduled Meds: . buPROPion  300 mg Oral Daily  . cefTRIAXone (ROCEPHIN)  IV  1 g Intravenous Q24H  . docusate sodium  100 mg Oral BID  . enoxaparin (LOVENOX) injection  40 mg Subcutaneous Q24H  . gabapentin  800 mg Oral QID  . nicotine  21 mg Transdermal Daily  . sertraline  100 mg Oral Daily  . traZODone  100 mg Oral QHS  . vancomycin  1,000 mg Intravenous Q8H   Continuous Infusions: . sodium chloride 10 mL/hr at 10/21/14 1046     Carlea Badour, DO  [facility] Pager [phone]  If 7PM-7AM, please contact night-coverage [email or web address] Password [email or web address] 10/21/2014, 5:50 PM   LOS: 1 day

## 2014-10-21 NOTE — Progress Notes (Signed)
CSW assisting with d/c planning. Spoke to [name], Charity fundraiserN, at [facility] They are able to readmit pt once he no longer requires IV antibiotics or in need of any pain meds. [name] does not require any clinical info at this time. D/C summary / instructions will be needed at time of d/c. CSW will continue to follow to assist with [facility] planning [name].  Cori RazorJamie Baylon Santelli LCSW [phone]

## 2014-10-21 NOTE — Progress Notes (Signed)
Clinical Social Work Department BRIEF PSYCHOSOCIAL ASSESSMENT 10/21/2014  Patient:  [name] [name],[name] [name]     Account Number:  [id number]     Admit date:  10/20/2014  Clinical Social Worker:  Candie ChromanHAIDINGER,Kouper Spinella, LCSW  Date/Time:  10/21/2014 07:48 AM  Referred by:  CSW  Date Referred:  10/21/2014 Referred for  Other - See comment   Other Referral:   From [facility]   Interview type:  Patient Other interview type:    PSYCHOSOCIAL DATA Living Status:  FACILITY Admitted from facility:  Other Level of care:   Primary support name:  Ulyses SouthwardJack Kitchen Primary support relationship to patient:  FRIEND Degree of support available:   unclear    CURRENT CONCERNS Current Concerns  Post-Acute Placement   Other Concerns:    SOCIAL WORK ASSESSMENT / PLAN Pt is a 38 yr old gentleman [facility]. Pt [facility] and drug abuse treatment. He hopes to return following hospital d/c to continue treatment. Pt is presently being hospitalized for cellulitis. Out pt treatment has failed and IV antibiotics are needed. CSW will contact [name] and request readmission once pt is stable for d/c and IV medication is no longer required.   Assessment/plan status:   Other assessment/ plan:   Information/referral to community resources:   None needed at this time.    PATIENT'S/FAMILY'S RESPONSE TO PLAN OF CARE: Pt states he is feeling better than he did yesterday. [facility]. Pt appreciates assistance offered by CSW.   Cori RazorJamie Abyan Cadman LCSW [phone]

## 2014-10-22 DIAGNOSIS — M79609 Pain in unspecified limb: Secondary | ICD-10-CM

## 2014-10-22 DIAGNOSIS — F319 Bipolar disorder, unspecified: Secondary | ICD-10-CM

## 2014-10-22 DIAGNOSIS — R609 Edema, unspecified: Secondary | ICD-10-CM

## 2014-10-22 DIAGNOSIS — F332 Major depressive disorder, recurrent severe without psychotic features: Secondary | ICD-10-CM

## 2014-10-22 LAB — BASIC METABOLIC PANEL
ANION GAP: 5 (ref 5–15)
BUN: 11 mg/dL (ref 6–23)
CHLORIDE: 109 meq/L (ref 96–112)
CO2: 25 mmol/L (ref 19–32)
CREATININE: 0.62 mg/dL (ref 0.50–1.35)
Calcium: 8.3 mg/dL — ABNORMAL LOW (ref 8.4–10.5)
GFR calc non Af Amer: >90 mL/min (ref >90)
Glucose, Bld: 129 mg/dL — ABNORMAL HIGH (ref 70–99)
POTASSIUM: 3.8 mmol/L (ref 3.5–5.1)
Sodium: 139 mmol/L (ref 135–145)

## 2014-10-22 LAB — CBC
HCT: 43.7 % (ref 39.0–52.0)
HEMOGLOBIN: 13.8 g/dL (ref 13.0–17.0)
MCH: 30.5 pg (ref 26.0–34.0)
MCHC: 31.6 g/dL (ref 30.0–36.0)
MCV: 96.7 fL (ref 78.0–100.0)
Platelets: 143 10*3/uL — ABNORMAL LOW (ref 150–400)
RBC: 4.52 MIL/uL (ref 4.22–5.81)
RDW: 14 % (ref 11.5–15.5)
WBC: 8.2 10*3/uL (ref 4.0–10.5)

## 2014-10-22 LAB — VANCOMYCIN, TROUGH: Vancomycin Tr: 9.7 ug/mL — ABNORMAL LOW (ref 10.0–20.0)

## 2014-10-22 MED ORDER — FUROSEMIDE 10 MG/ML IJ SOLN
40.0000 mg | Freq: Once | INTRAMUSCULAR | Status: AC
  Administered 2014-10-22: 40 mg via INTRAVENOUS
  Filled 2014-10-22: qty 4

## 2014-10-22 NOTE — Progress Notes (Signed)
Left lower extremity venous duplex completed.  Left:  No evidence of DVT, superficial thrombosis, or Baker's cyst.  Right:  Negative for DVT in the common femoral vein.  

## 2014-10-22 NOTE — Progress Notes (Signed)
[facility] PROGRESS NOTE  CANDON CARAS ZOX:[id number] DOB: 09-04-1976 DOA: 10/20/2014 PCP: Holland Commons, NP  Brief history 39 year old male with a history of chronic hepatitis C, cellulitis, bipolar disorder, and substance induced mood disorder presents with one-week history of increasing left lower extremity pain, edema, and erythema. The patient went to the emergency department on 10/13/2014 and was discharged with clindamycin. He endorses compliance. Even with oral clindamycin, the patient continued to have increasing pain and erythema. The patient denies any recent injuries or trauma. He has had 2 admissions for cellulitis after which she was discharged on 06/02/2014 and 02/06/2014. The patient was noted to have WBC 28.4 and tachycardia with heart rate up to 114 in the emergency department. He was started on ceftriaxone and vancomycin IV.  Assessment/Plan: #1 sepsis secondary to left lower extremity cellulitis Clinical improvement. Patient currently afebrile. WBC trending down. Continue empiric IV vancomycin. On discharge was transitioned to oral Bactrim and treat for a total of 10-14 days.  #2 recurrent left lower extremity cellulitis This is patient's third hospitalization in the past 8 months. May be secondary to tinea pedis. Patient has been educated on keeping his feet dry and changing socks. Continue IV vancomycin. Doppler of the lower extremity pending to rule out DVT. On discharge will transition to Bactrim DS 2 tabs twice a day to complete a 10-14 day course of antibiotic therapy.  #3 chronic hepatitis C/transaminitis Being followed up in the infectious disease center as outpatient.  #4 bipolar disorder Stable. Continue Zoloft, Wellbutrin, trazodone.  #5 tobacco abuse Tobacco cessation. Continue nicotine patch.  #6 prophylaxis Lovenox for DVT prophylaxis. Code Status: Full Family Communication: Updated patient, no family present. Disposition Plan: [name] in 1-2  days, if Dopplers are negative.   Consultants:  None  Procedures:  X-ray of left ankle 10/20/2014  X-ray of left tib-fib 10/20/2014  Antibiotics:  IV vancomycin 10/20/2014  IV Rocephin 10/20/2014>>>>> 10/21/2014  HPI/Subjective: Patient states he's feeling better. No complaints.  Objective: Filed Vitals:   10/22/14 0650  BP: 98/48  Pulse: 53  Temp: 98 F (36.7 C)  Resp: 16    Intake/Output Summary (Last 24 hours) at 10/22/14 1012 Last data filed at 10/22/14 0626  Gross per 24 hour  Intake 1609.83 ml  Output      0 ml  Net 1609.83 ml   Filed Weights   10/20/14 1451 10/21/14 0549  Weight: 99.791 kg (220 lb) 99.791 kg (220 lb)    Exam:   General:  NAD  Cardiovascular: RRR  Respiratory: CTAB  Abdomen: Soft, nontender, nondistended, positive bowel sounds.  Musculoskeletal: No clubbing or cyanosis. Left lower extremity with decreased erythema, decreased warmth, less tender to palpation, trace to 1+ edema on the foot.  Data Reviewed: Basic Metabolic Panel:  Recent Labs Lab 10/20/14 1527 10/20/14 1950 10/21/14 0435 10/22/14 0446  NA 137  --  137 139  K 3.9  --  3.5 3.8  CL 105  --  105 109  CO2 26  --  26 25  GLUCOSE 131*  --  128* 129*  BUN 12  --  11 11  CREATININE 0.79 0.76 0.65 0.62  CALCIUM 9.0  --  8.4 8.3*  MG  --   --  1.9  --   PHOS  --   --  4.1  --    Liver Function Tests:  Recent Labs Lab 10/20/14 1527 10/21/14 0435  AST 62* 48*  ALT 142* 116*  ALKPHOS 79 69  BILITOT 0.9  0.7  PROT 6.8 5.9*  ALBUMIN 4.0 3.3*   No results for input(s): LIPASE, AMYLASE in the last 168 hours. No results for input(s): AMMONIA in the last 168 hours. CBC:  Recent Labs Lab 10/20/14 1527 10/20/14 1950 10/21/14 0435 10/22/14 0446  WBC 28.4* 22.7* 17.8* 8.2  NEUTROABS 24.4*  --   --   --   HGB 15.3 15.5 14.2 13.8  HCT 44.2 46.0 42.1 43.7  MCV 93.4 95.0 94.4 96.7  PLT 189 177 PLATELET CLUMPS NOTED ON SMEAR, UNABLE TO ESTIMATE 143*    Cardiac Enzymes: No results for input(s): CKTOTAL, CKMB, CKMBINDEX, TROPONINI in the last 168 hours. BNP (last 3 results) No results for input(s): PROBNP in the last 8760 hours. CBG: No results for input(s): GLUCAP in the last 168 hours.  No results found for this or any previous visit (from the past 240 hour(s)).   Studies: Dg Tibia/fibula Left  10/20/2014   CLINICAL DATA:  Follow-up cellulitis  EXAM: LEFT TIBIA AND FIBULA - 2 VIEW  COMPARISON:  None.  FINDINGS: There is no evidence of fracture or other focal bone lesions. Generalized soft tissue edema in the left lower leg.  IMPRESSION: No acute osseous abnormality of the left tibia/ fibula.   Electronically Signed   By: Elige Ko   On: 10/20/2014 20:36   Dg Ankle 2 Views Left  10/20/2014   CLINICAL DATA:  Follow-up cellulitis  EXAM: LEFT ANKLE - 2 VIEW  COMPARISON:  None.  FINDINGS: There is no evidence of fracture, dislocation, or joint effusion. There is a small plantar calcaneal spur. Mild soft tissue edema around the knee ankle and lower leg.  IMPRESSION: No acute osseous abnormality of the left ankle.   Electronically Signed   By: Elige Ko   On: 10/20/2014 20:23    Scheduled Meds: . buPROPion  300 mg Oral Daily  . docusate sodium  100 mg Oral BID  . enoxaparin (LOVENOX) injection  40 mg Subcutaneous Q24H  . gabapentin  800 mg Oral QID  . nicotine  21 mg Transdermal Daily  . sertraline  100 mg Oral Daily  . traZODone  100 mg Oral QHS  . vancomycin  1,000 mg Intravenous Q8H   Continuous Infusions: . sodium chloride 10 mL/hr at 10/21/14 1046    Principal Problem:   Sepsis Active Problems:   Cellulitis of left lower extremity   Polysubstance (including opioids) dependence with physiological dependence   Hepatitis C virus infection without hepatic coma   Cellulitis   Tobacco dependence   Chronic pain syndrome   MDD (major depressive disorder), recurrent severe, without psychosis    Time spent: 35  minutes    [facility] MD [facility] Pager [phone]. If 7PM-7AM, please contact night-coverage at [email or web address], password [facility] 10/22/2014, 10:12 AM  LOS: 2 days

## 2014-10-22 NOTE — Progress Notes (Addendum)
ANTIBIOTIC CONSULT NOTE - FOLLOW UP  Pharmacy Consult for Vancomycin Indication: LLE cellulitis  Allergies  Allergen Reactions  . Lithium Anaphylaxis  . Penicillins Rash    Patient Measurements: Height:  (180.3 cm) Weight: 220 lb (99.791 kg) IBW/kg (Calculated) : 75.3  Vital Signs: Temp: 98 F (36.7 C) (01/01 0650) Temp Source: Oral (01/01 0650) BP: 98/48 mmHg (01/01 0650) Pulse Rate: 53 (01/01 0650) Intake/Output from previous day: 12/31 0701 - 01/01 0700 In: 1909.8 [P.O.:1380; I.V.:229.8; IV Piggyback:200] Out: -  Intake/Output from this shift:    Labs:  Recent Labs  10/20/14 1950 10/21/14 0435 10/22/14 0446  WBC 22.7* 17.8* 8.2  HGB 15.5 14.2 13.8  PLT 177 PLATELET CLUMPS NOTED ON SMEAR, UNABLE TO ESTIMATE 143*  CREATININE 0.76 0.65 0.62   Estimated Creatinine Clearance: 153.7 mL/min (by C-G formula based on Cr of 0.62).  Recent Labs  10/22/14 0448  VANCOTROUGH 9.7*     Microbiology: No results found for this or any previous visit (from the past 720 hour(s)).  Anti-infectives    Start     Dose/Rate Route Frequency Ordered Stop   10/21/14 1600  cefTRIAXone (ROCEPHIN) 1 g in dextrose 5 % 50 mL IVPB - Premix  Status:  Discontinued     1 g100 mL/hr over 30 Minutes Intravenous Every 24 hours 10/20/14 2057 10/21/14 1800   10/20/14 2200  vancomycin (VANCOCIN) IVPB 1000 mg/200 mL premix     1,000 mg200 mL/hr over 60 Minutes Intravenous Every 8 hours 10/20/14 2111     10/20/14 2000  cefTRIAXone (ROCEPHIN) 1 g in dextrose 5 % 50 mL IVPB - Premix  Status:  Discontinued     1 g100 mL/hr over 30 Minutes Intravenous Every 24 hours 10/20/14 1956 10/20/14 2057   10/20/14 1633  cefTRIAXone (ROCEPHIN) 1 G injection  Status:  Discontinued    Comments:  Burns, Amy   : cabinet override      10/20/14 1633 10/20/14 2000   10/20/14 1615  cefTRIAXone (ROCEPHIN) 1 g in dextrose 5 % 50 mL IVPB     1 g100 mL/hr over 30 Minutes Intravenous  Once 10/20/14 1609 10/20/14 1659       Assessment: HPI: To ED on 12/30 from [facility] facility. Was seen on 12/23 for cellulitis of left leg. Finished course of Clindamycin 300 mg qid x 7 days. Pt. Stated he was checked for DVT last week and none found. Notes state pt. was treated successfully for similar episode in Aug 2015 with Rocephin and Vanc.  12/30 >> Vancomycin >> 12/30 >> Rocephin >> 12/31  Tmax: AF now WBCs: greatly improved to WNL Renal: SCr 0.62 stable CrCl > 100 (CG & N)  12/30 blood x 2: collected  Dose changes/drug level info: 1/1: Vanc trough = 9.7 before 5th dose of 1g q8h - continue same as essentially at goal and expect further accumulation with q8h dosing.  Goal of Therapy:  Vancomycin trough level 10-15 mcg/ml  Plan:   Continue vancomycin 1g IV q8h  Continue to follow up renal function & cultures  Plan noted to transition to PO bactrim if clinically improves  Loralee Pacas, PharmD, BCPS Pager: [phone] 10/22/2014,8:13 AM

## 2014-10-22 NOTE — Progress Notes (Signed)
CSW assisting with d/c planning. Multiple attempts made to contact [name], through out the day, without success. Admissions may be closed today due to holiday. Weekend csw instructed to contact facility on SAT to see if they will readmit pt with an order for tylenol # 3.  Cori Razor LCSW [phone]

## 2014-10-23 DIAGNOSIS — B1920 Unspecified viral hepatitis C without hepatic coma: Secondary | ICD-10-CM

## 2014-10-23 LAB — BASIC METABOLIC PANEL
Anion gap: 7 (ref 5–15)
BUN: 10 mg/dL (ref 6–23)
CO2: 31 mmol/L (ref 19–32)
Calcium: 9.3 mg/dL (ref 8.4–10.5)
Chloride: 104 mEq/L (ref 96–112)
Creatinine, Ser: 0.89 mg/dL (ref 0.50–1.35)
GFR calc Af Amer: >90 mL/min (ref >90)
Glucose, Bld: 112 mg/dL — ABNORMAL HIGH (ref 70–99)
Potassium: 4 mmol/L (ref 3.5–5.1)
Sodium: 142 mmol/L (ref 135–145)

## 2014-10-23 LAB — CBC
HCT: 46.8 % (ref 39.0–52.0)
Hemoglobin: 15.5 g/dL (ref 13.0–17.0)
MCH: 31.8 pg (ref 26.0–34.0)
MCHC: 33.1 g/dL (ref 30.0–36.0)
MCV: 95.9 fL (ref 78.0–100.0)
PLATELETS: 143 10*3/uL — AB (ref 150–400)
RBC: 4.88 MIL/uL (ref 4.22–5.81)
RDW: 13.8 % (ref 11.5–15.5)
WBC: 8.7 10*3/uL (ref 4.0–10.5)

## 2014-10-23 MED ORDER — PANTOPRAZOLE SODIUM 40 MG PO TBEC
40.0000 mg | DELAYED_RELEASE_TABLET | Freq: Every day | ORAL | Status: DC
  Administered 2014-10-23: 40 mg via ORAL
  Filled 2014-10-23: qty 1

## 2014-10-23 MED ORDER — SULFAMETHOXAZOLE-TRIMETHOPRIM 800-160 MG PO TABS
2.0000 | ORAL_TABLET | Freq: Two times a day (BID) | ORAL | Status: DC
  Administered 2014-10-23: 2 via ORAL
  Filled 2014-10-23 (×2): qty 2

## 2014-10-23 MED ORDER — IBUPROFEN 800 MG PO TABS: 800.0000 mg | ORAL_TABLET | Freq: Three times a day (TID) | ORAL | Status: DC | PRN

## 2014-10-23 MED ORDER — SULFAMETHOXAZOLE-TRIMETHOPRIM 800-160 MG PO TABS: 2.0000 | ORAL_TABLET | Freq: Two times a day (BID) | ORAL | Status: DC

## 2014-10-23 MED ORDER — NICOTINE 21 MG/24HR TD PT24: 21.0000 mg | MEDICATED_PATCH | Freq: Every day | TRANSDERMAL | Status: DC

## 2014-10-23 MED ORDER — METHOCARBAMOL 500 MG PO TABS: 500.0000 mg | ORAL_TABLET | Freq: Three times a day (TID) | ORAL | Status: DC | PRN

## 2014-10-23 MED ORDER — PANTOPRAZOLE SODIUM 40 MG PO TBEC: 40.0000 mg | DELAYED_RELEASE_TABLET | Freq: Every day | ORAL | Status: DC

## 2014-10-23 NOTE — Clinical Social Work Note (Signed)
  CSW received a call from MD stating that pt was ready for discharge  CSW called [name] and arranged for pt pickup  CSW prepared packet and gave to RN  No further CSW needs  CSW signing off  .Elray Buba, LCSW [facility] Clinical Social Worker - Weekend Coverage cell #: [phone]

## 2014-10-23 NOTE — Discharge Summary (Signed)
Physician Discharge Summary  [name] [name] ZOX:[id number] DOB: Apr 20, 1976 DOA: 10/20/2014  PCP: [name] Commons, NP  Admit date: 10/20/2014 Discharge date: 10/23/2014  Time spent: 65 minutes  Recommendations for Outpatient Follow-up:  1. Follow-up with [name] Commons, NP in 1-2 weeks for follow-up on his recurrent left lower extremity cellulitis. Patient will need a CBC and a basic metabolic profile done on follow-up to follow-up on electrolytes renal function. 2. Patient be discharged to [facility].  Discharge Diagnoses:  Principal Problem:   Sepsis Active Problems:   Cellulitis of left lower extremity   Polysubstance (including opioids) dependence with physiological dependence   Hepatitis C virus infection without hepatic coma   Cellulitis   Tobacco dependence   Chronic pain syndrome   MDD (major depressive disorder), recurrent severe, without psychosis   Bipolar affective disorder   Discharge Condition: Stable and improved  Diet recommendation: Regular  Filed Weights   10/20/14 1451 10/21/14 0549  Weight: 99.791 kg (220 lb) 99.791 kg (220 lb)    History of present illness:  [name] [name] is a 39 y.o. male   has a past medical history of Bipolar disorder; Hepatitis C; Depression; and Cellulitis.   Presented with pain and swelling of left leg x 7 days. He has had recurent episodes affecting same leg.  Patient was admitted last time in August 2015 with similar episode and responded well to vanc and rocephin. Blood cultures were negative. He was discharged on septra. He has hx of Hep C. HIV negative as of 6/15. Patient is currently staying at [facility] for drug abuse.  Denied any chills, body aches. He was seen at [facility] on 12/23 started on clindamycin took it for 7 days and had some improvement. On the morning of admission, he woke up with chills, pain all over and worsening redness in left leg. He stated that he was tested for DVT one week prior to admission, and it was negative.  Patient presented to [facility] and was found to have WBC 28. He was transferred to [facility] med-surg bed.  Patient reported headache since the morning of admission.   Hospitalist was called for admission for Recurrent Left leg cellulitis   Hospital Course:  #1 sepsis secondary to left lower extremity cellulitis On admission patient was noted to have criteria for sepsis secondary to his lower extremity cellulitis. Patient was noted to be tachycardic in the white count of 28. Patient was admitted and blood cultures were obtained with no growth to date. Patient was placed empirically on IV vancomycin IV Rocephin and patient was followed. Patient improved clinically. Patient remained afebrile. Patient's leukocytosis trended down and had resolved by day of discharge. Patient be transitioned to oral Bactrim DS 2 tabs twice daily to be taking for 8 more days to complete a ten-day course of antibiotic therapy. Patient will follow-up with PCP as outpatient.  #2 recurrent left lower extremity cellulitis This was patient's third hospitalization in the past 8 months. May be secondary to tinea pedis. Patient has been educated on keeping his feet dry and changing socks. Patient was initially placed on IV vancomycin and IV Rocephin. Patient improved clinically. It was felt patient's infection was likely strep or staphylococcal infection and a such IV Rocephin was discontinued. Patient was maintained on IV vancomycin throughout the hospitalization and patient improved clinically. Patient remained afebrile. Patient's leukocytosis trended down such that his leukocytosis had resolved by day of discharge. Lower extremity Dopplers were obtained which were negative for DVT. Patient was transitioned to oral  Bactrim DS 2 tabs by mouth twice a day prior to discharge and be discharged on 8 more days to complete a ten-day course of antibiotic therapy. Patient will follow-up with PCP one to 2 weeks post discharge. Patient be discharged in  stable and improved condition.   #3 chronic hepatitis C/transaminitis Being followed up in the infectious disease center as outpatient.  #4 bipolar disorder Stable. Continued on home regimen of Zoloft, Wellbutrin, trazodone.  #5 tobacco abuse Tobacco cessation. Patient was placed on a nicotine patch.  Procedures:  X-ray of left ankle 10/20/2014  X-ray of left tib-fib 10/20/2014  Left lower extremity Doppler 10/22/2014  Consultations:  None  Discharge Exam: Filed Vitals:   10/23/14 0627  BP: 111/62  Pulse: 61  Temp: 98.3 F (36.8 C)  Resp: 16    General: NAD Cardiovascular: RRR Respiratory: CTAB Extremities: No clubbing or cyanosis. Left lower extremity with significant improvement with erythema, warmth and swelling. Less tenderness to palpation.  Discharge Instructions   Discharge Instructions    Diet general    Complete by:  As directed      Discharge instructions    Complete by:  As directed   fOLLOW UP WITH [name] Commons, NP IN 1-2 WEEKS.     Increase activity slowly    Complete by:  As directed           Current Discharge Medication List    START taking these medications   Details  nicotine (NICODERM CQ - DOSED IN MG/24 HOURS) 21 mg/24hr patch Place 1 patch (21 mg total) onto the skin daily. Qty: 28 patch, Refills: 0    pantoprazole (PROTONIX) 40 MG tablet Take 1 tablet (40 mg total) by mouth daily at 6 (six) AM. Qty: 30 tablet, Refills: 0    sulfamethoxazole-trimethoprim (BACTRIM DS,SEPTRA DS) 800-160 MG per tablet Take 2 tablets by mouth every 12 (twelve) hours. TAKE FOR 8 DAYS THEN STOP. Qty: 32 tablet, Refills: 0      CONTINUE these medications which have CHANGED   Details  ibuprofen (ADVIL,MOTRIN) 800 MG tablet Take 1 tablet (800 mg total) by mouth every 8 (eight) hours as needed for mild pain or moderate pain. Qty: 30 tablet, Refills: 0    methocarbamol (ROBAXIN) 500 MG tablet Take 1 tablet (500 mg total) by mouth every 8 (eight) hours  as needed for muscle spasms. Qty: 20 tablet, Refills: 0      CONTINUE these medications which have NOT CHANGED   Details  buPROPion (WELLBUTRIN XL) 300 MG 24 hr tablet Take 1 tablet (300 mg total) by mouth daily. Qty: 30 tablet, Refills: 0    gabapentin (NEURONTIN) 400 MG capsule Take 2 capsules (800 mg total) by mouth 4 (four) times daily. Qty: 240 capsule, Refills: 0    hydrOXYzine (ATARAX/VISTARIL) 25 MG tablet Take 1 tablet (25 mg total) by mouth every 6 (six) hours as needed for anxiety. Qty: 30 tablet, Refills: 0    Multiple Vitamin (MULTIVITAMIN WITH MINERALS) TABS tablet Take 1 tablet by mouth daily.    sertraline (ZOLOFT) 100 MG tablet Take 1 tablet (100 mg total) by mouth daily. Qty: 30 tablet, Refills: 0    traZODone (DESYREL) 100 MG tablet Take 100 mg by mouth at bedtime.      STOP taking these medications     clindamycin (CLEOCIN) 300 MG capsule      lidocaine (LIDODERM) 5 %        Allergies  Allergen Reactions  . Lithium Anaphylaxis  .  Penicillins Rash   Follow-up Information    Follow up with [name] Commons, NP. Schedule an appointment as soon as possible for a visit in 1 week.   Specialty:  Internal Medicine   Why:  F/U IN 1-2 WEEKS   Contact information:   [address] [place] [address] [phone]        The results of significant diagnostics from this hospitalization (including imaging, microbiology, ancillary and laboratory) are listed below for reference.    Significant Diagnostic Studies: Dg Tibia/fibula Left  10/20/2014   CLINICAL DATA:  Follow-up cellulitis  EXAM: LEFT TIBIA AND FIBULA - 2 VIEW  COMPARISON:  None.  FINDINGS: There is no evidence of fracture or other focal bone lesions. Generalized soft tissue edema in the left lower leg.  IMPRESSION: No acute osseous abnormality of the left tibia/ fibula.   Electronically Signed   By: Elige Ko   On: 10/20/2014 20:36   Dg Ankle 2 Views Left  10/20/2014   CLINICAL DATA:   Follow-up cellulitis  EXAM: LEFT ANKLE - 2 VIEW  COMPARISON:  None.  FINDINGS: There is no evidence of fracture, dislocation, or joint effusion. There is a small plantar calcaneal spur. Mild soft tissue edema around the knee ankle and lower leg.  IMPRESSION: No acute osseous abnormality of the left ankle.   Electronically Signed   By: Elige Ko   On: 10/20/2014 20:23   US Venous Img Lower Unilateral Left  10/13/2014   CLINICAL DATA:  Left lower extremity redness and swelling for 3 days  EXAM: LEFT LOWER EXTREMITY VENOUS DUPLEX ULTRASOUND  TECHNIQUE: Gray-scale sonography with graded compression, as well as color Doppler and duplex ultrasound were performed to evaluate the left lower extremity deep venous system from the level of the common femoral vein and including the common femoral, femoral, profunda femoral, popliteal and calf veins including the posterior tibial, peroneal and gastrocnemius veins when visible. The superficial great saphenous vein was also interrogated. Spectral Doppler was utilized to evaluate flow at rest and with distal augmentation maneuvers in the common femoral, femoral and popliteal veins.  COMPARISON:  None.  FINDINGS: Contralateral Common Femoral Vein: Respiratory phasicity is normal and symmetric with the symptomatic side. No evidence of thrombus. Normal compressibility.  Common Femoral Vein: No evidence of thrombus. Normal compressibility, respiratory phasicity and response to augmentation.  Saphenofemoral Junction: No evidence of thrombus. Normal compressibility and flow on color Doppler imaging.  Profunda Femoral Vein: No evidence of thrombus. Normal compressibility and flow on color Doppler imaging.  Femoral Vein: No evidence of thrombus. Normal compressibility, respiratory phasicity and response to augmentation.  Popliteal Vein: No evidence of thrombus. Normal compressibility, respiratory phasicity and response to augmentation.  Calf Veins: No evidence of thrombus. Normal  compressibility and flow on color Doppler imaging.  Superficial Great Saphenous Vein: No evidence of thrombus. Normal compressibility and flow on color Doppler imaging.  Venous Reflux:  None.  Other Findings:  There is left calf region edema.  IMPRESSION: No evidence of left lower extremity deep venous thrombosis. The right common femoral vein also patent. There is left calf region soft tissue edema.   Electronically Signed   By: Bretta Bang M.D.   On: 10/13/2014 15:05    Microbiology: No results found for this or any previous visit (from the past 240 hour(s)).   Labs: Basic Metabolic Panel:  Recent Labs Lab 10/20/14 1527 10/20/14 1950 10/21/14 0435 10/22/14 0446 10/23/14 0457  NA 137  --  137  139 142  K 3.9  --  3.5 3.8 4.0  CL 105  --  105 109 104  CO2 26  --  GLUCOSE 131*  --  128* 129* 112*  BUN 12  --  CREATININE 0.79 0.76 0.65 0.62 0.89  CALCIUM 9.0  --  8.4 8.3* 9.3  MG  --   --  1.9  --   --   PHOS  --   --  4.1  --   --    Liver Function Tests:  Recent Labs Lab 10/20/14 1527 10/21/14 0435  AST 62* 48*  ALT 142* 116*  ALKPHOS 79 69  BILITOT 0.9 0.7  PROT 6.8 5.9*  ALBUMIN 4.0 3.3*   No results for input(s): LIPASE, AMYLASE in the last 168 hours. No results for input(s): AMMONIA in the last 168 hours. CBC:  Recent Labs Lab 10/20/14 1527 10/20/14 1950 10/21/14 0435 10/22/14 0446 10/23/14 0457  WBC 28.4* 22.7* 17.8* 8.2 8.7  NEUTROABS 24.4*  --   --   --   --   HGB 15.3 15.5 14.2 13.8 15.5  HCT 44.2 46.0 42.1 43.7 46.8  MCV 93.4 95.0 94.4 96.7 95.9  PLT 189 177 PLATELET CLUMPS NOTED ON SMEAR, UNABLE TO ESTIMATE 143* 143*   Cardiac Enzymes: No results for input(s): CKTOTAL, CKMB, CKMBINDEX, TROPONINI in the last 168 hours. BNP: BNP (last 3 results) No results for input(s): PROBNP in the last 8760 hours. CBG: No results for input(s): GLUCAP in the last 168 hours.     SignedRamiro Harvest MD [facility] 10/23/2014, 10:23 AM

## 2014-10-23 NOTE — Progress Notes (Signed)
Assessment unchanged. Report called and given to RN at [facility]. Pt verbalized understanding of follow up care and antibiotics to continue at facility through teach back. Packet with information including AVS prepared by SW. Discharged via wc to front entrance to meet awaiting [name] vehicle to transport back to facility. Accompanied by NT and friend. Information packet given to driver to present to staff upon arrival back.

## 2014-10-25 ENCOUNTER — Ambulatory Visit: Payer: Medicare Other | Attending: Internal Medicine | Admitting: Internal Medicine

## 2014-10-25 ENCOUNTER — Encounter: Payer: Self-pay | Admitting: Internal Medicine

## 2014-10-25 ENCOUNTER — Ambulatory Visit (HOSPITAL_BASED_OUTPATIENT_CLINIC_OR_DEPARTMENT_OTHER): Payer: Medicare Other | Admitting: Emergency Medicine

## 2014-10-25 VITALS — BP 138/78 | HR 99 | Temp 98.1°F | Resp 16 | Ht 71.0 in | Wt 251.0 lb

## 2014-10-25 DIAGNOSIS — R0981 Nasal congestion: Secondary | ICD-10-CM | POA: Diagnosis not present

## 2014-10-25 DIAGNOSIS — F319 Bipolar disorder, unspecified: Secondary | ICD-10-CM | POA: Diagnosis not present

## 2014-10-25 DIAGNOSIS — Z880 Allergy status to penicillin: Secondary | ICD-10-CM | POA: Diagnosis not present

## 2014-10-25 DIAGNOSIS — Z888 Allergy status to other drugs, medicaments and biological substances status: Secondary | ICD-10-CM | POA: Diagnosis not present

## 2014-10-25 DIAGNOSIS — F17210 Nicotine dependence, cigarettes, uncomplicated: Secondary | ICD-10-CM | POA: Insufficient documentation

## 2014-10-25 DIAGNOSIS — Z79899 Other long term (current) drug therapy: Secondary | ICD-10-CM | POA: Diagnosis not present

## 2014-10-25 DIAGNOSIS — B1920 Unspecified viral hepatitis C without hepatic coma: Secondary | ICD-10-CM | POA: Diagnosis not present

## 2014-10-25 DIAGNOSIS — L03116 Cellulitis of left lower limb: Secondary | ICD-10-CM | POA: Diagnosis present

## 2014-10-25 DIAGNOSIS — Z23 Encounter for immunization: Secondary | ICD-10-CM

## 2014-10-25 LAB — CBC
HEMATOCRIT: 45.7 % (ref 39.0–52.0)
Hemoglobin: 15.9 g/dL (ref 13.0–17.0)
MCH: 32.1 pg (ref 26.0–34.0)
MCHC: 34.8 g/dL (ref 30.0–36.0)
MCV: 92.3 fL (ref 78.0–100.0)
MPV: 11.3 fL (ref 8.6–12.4)
PLATELETS: 116 10*3/uL — AB (ref 150–400)
RBC: 4.95 MIL/uL (ref 4.22–5.81)
RDW: 13.7 % (ref 11.5–15.5)
WBC: 10.4 10*3/uL (ref 4.0–10.5)

## 2014-10-25 LAB — BASIC METABOLIC PANEL
BUN: 14 mg/dL (ref 6–23)
CO2: 27 mEq/L (ref 19–32)
CREATININE: 0.94 mg/dL (ref 0.50–1.35)
Calcium: 9.2 mg/dL (ref 8.4–10.5)
Chloride: 103 mEq/L (ref 96–112)
Glucose, Bld: 82 mg/dL (ref 70–99)
Potassium: 5.2 mEq/L (ref 3.5–5.3)
Sodium: 140 mEq/L (ref 135–145)

## 2014-10-25 MED ORDER — FLUTICASONE PROPIONATE 50 MCG/ACT NA SUSP: 2.0000 | Freq: Every day | NASAL | Status: DC

## 2014-10-25 MED ORDER — FUROSEMIDE 20 MG PO TABS: 20.0000 mg | ORAL_TABLET | Freq: Every day | ORAL | Status: DC

## 2014-10-25 NOTE — Progress Notes (Signed)
This RN made appointment for the patient with the Infectious Disease MD.  For 11/09/14 at 9 am-patient was given information.  Lance Bosch RN

## 2014-10-25 NOTE — Progress Notes (Signed)
Patient ID: [name] [name], male   DOB: 1976-03-28, 39 y.o.   MRN: [id number]   CC: HFU  HPI: [name] [name] is a 39 y.o. male here today for a follow up visit.  Patient has past medical history of bipolar disorder, Hep C, depression, and cellulitis.  Patient was recently discharged from [facility] for recurrent cellulitis.  He was given IV Vancomycin and IV Rocephin while inpatient.  He was discharged with PO Bactrim.  He is almost complete with his Bactrim but states that he does not feel the bactrim will clear his infection.  He has been on Bactrim several times before for the same issue without resolution.  He denies fever or chills today. Patient reports that he has been to Infectious Disease but was only seen for Hep C management.  He reports that he is currently staying at [facility] for alcohol abuse and has a hard time getting to his appointments.     Patient has No headache, No chest pain, No abdominal pain - No Nausea, No new weakness tingling or numbness, No Cough - SOB.  Allergies  Allergen Reactions  . Lithium Anaphylaxis  . Penicillins Rash   Past Medical History  Diagnosis Date  . Bipolar disorder   . Hepatitis C   . Depression   . Cellulitis    Current Outpatient Prescriptions on File Prior to Visit  Medication Sig Dispense Refill  . buPROPion (WELLBUTRIN XL) 300 MG 24 hr tablet Take 1 tablet (300 mg total) by mouth daily. 30 tablet 0  . gabapentin (NEURONTIN) 400 MG capsule Take 2 capsules (800 mg total) by mouth 4 (four) times daily. 240 capsule 0  . hydrOXYzine (ATARAX/VISTARIL) 25 MG tablet Take 1 tablet (25 mg total) by mouth every 6 (six) hours as needed for anxiety. 30 tablet 0  . ibuprofen (ADVIL,MOTRIN) 800 MG tablet Take 1 tablet (800 mg total) by mouth every 8 (eight) hours as needed for mild pain or moderate pain. 30 tablet 0  . methocarbamol (ROBAXIN) 500 MG tablet Take 1 tablet (500 mg total) by mouth every 8 (eight) hours as needed for muscle spasms. 20  tablet 0  . Multiple Vitamin (MULTIVITAMIN WITH MINERALS) TABS tablet Take 1 tablet by mouth daily.    . nicotine (NICODERM CQ - DOSED IN MG/24 HOURS) 21 mg/24hr patch Place 1 patch (21 mg total) onto the skin daily. (Patient not taking: Reported on 10/25/2014) 28 patch 0  . pantoprazole (PROTONIX) 40 MG tablet Take 1 tablet (40 mg total) by mouth daily at 6 (six) AM. 30 tablet 0  . sertraline (ZOLOFT) 100 MG tablet Take 1 tablet (100 mg total) by mouth daily. 30 tablet 0  . sulfamethoxazole-trimethoprim (BACTRIM DS,SEPTRA DS) 800-160 MG per tablet Take 2 tablets by mouth every 12 (twelve) hours. TAKE FOR 8 DAYS THEN STOP. 32 tablet 0  . traZODone (DESYREL) 100 MG tablet Take 100 mg by mouth at bedtime.     No current facility-administered medications on file prior to visit.   Family History  Problem Relation Age of Onset  . Alcohol abuse Father   . Cancer Maternal Aunt    History   Social History  . Marital Status: Divorced    Spouse Name: N/A    Number of Children: N/A  . Years of Education: N/A   Occupational History  . Not on file.   Social History Main Topics  . Smoking status: Current Every Day Smoker -- 1.50 packs/day    Types:  Cigarettes    Start date: 10/22/1981  . Smokeless tobacco: Never Used  . Alcohol Use: Yes     Comment: pt states approx a case a day  . Drug Use: Yes    Special: Marijuana, Benzodiazepines, Other-see comments, Methamphetamines     Comment: "pills"  . Sexual Activity: Not Currently   Other Topics Concern  . Not on file   Social History Narrative    Review of Systems: See HPI   Objective:   Filed Vitals:   10/25/14 0938  BP: 138/78  Pulse: 99  Temp: 98.1 F (36.7 C)  Resp: 16    Physical Exam  Constitutional: He is oriented to person, place, and time.  Cardiovascular: Normal rate, regular rhythm and normal heart sounds.   Pulmonary/Chest: Effort normal and breath sounds normal.  Musculoskeletal: He exhibits edema (LLE 3+ non  pitting edema).  Negative for DVT's  Neurological: He is alert and oriented to person, place, and time.  Skin: Skin is warm. There is erythema (LLE).     Lab Results  Component Value Date   WBC 8.7 10/23/2014   HGB 15.5 10/23/2014   HCT 46.8 10/23/2014   MCV 95.9 10/23/2014   PLT 143* 10/23/2014   Lab Results  Component Value Date   CREATININE 0.89 10/23/2014   BUN 10 10/23/2014   NA 142 10/23/2014   K 4.0 10/23/2014   CL 104 10/23/2014   CO2 31 10/23/2014    Lab Results  Component Value Date   HGBA1C 6.0* 10/21/2014   Lipid Panel  No results found for: CHOL, TRIG, HDL, CHOLHDL, VLDL, LDLCALC     Assessment and plan:   [name] [name] was seen today for follow-up.  Diagnoses and associated orders for this visit:  Cellulitis of leg, left - CBC - Basic metabolic panel - Ambulatory referral to Infectious Disease---appt scheduled for cellulitis before D/c'ed - furosemide (LASIX) 20 MG tablet; Take 1 tablet (20 mg total) by mouth daily  Sinus Congestion - fluticasone (FLONASE) 50 MCG/ACT nasal spray; Place 2 sprays into both nostrils daily. May use OTC Sudafed for symptom management   Return in about 4 weeks (around 11/22/2014) for cellulitis.       Holland Commons, NP-C [facility] [phone] 10/25/2014, 10:11 AM

## 2014-10-25 NOTE — Progress Notes (Signed)
Patient here to follow up on his recent dc from [facility] on Saturday-lft leg cellulitis Patient was sent home with prescription for bactrim that he has been taking Patient is not diabetic and has been smoking  Lance Bosch, RN

## 2014-10-25 NOTE — Patient Instructions (Signed)
You may also use allegra-D or sudafed over the counter to help with sinus congestion

## 2014-10-27 ENCOUNTER — Telehealth: Payer: Self-pay | Admitting: *Deleted

## 2014-10-27 LAB — CULTURE, BLOOD (ROUTINE X 2)
Culture: NO GROWTH
Culture: NO GROWTH

## 2014-10-27 NOTE — Telephone Encounter (Signed)
-----   Message from Ambrose FinlandValerie A Keck, NP sent at 10/25/2014 11:27 PM EST ----- Labs are within normal limits

## 2014-10-27 NOTE — Telephone Encounter (Signed)
Unable to leave voice message, voicemail is full

## 2014-11-09 ENCOUNTER — Encounter: Payer: Self-pay | Admitting: Internal Medicine

## 2014-11-09 ENCOUNTER — Ambulatory Visit (INDEPENDENT_AMBULATORY_CARE_PROVIDER_SITE_OTHER): Payer: Medicare Other | Admitting: Internal Medicine

## 2014-11-09 ENCOUNTER — Telehealth: Payer: Self-pay | Admitting: *Deleted

## 2014-11-09 ENCOUNTER — Other Ambulatory Visit: Payer: Self-pay | Admitting: Internal Medicine

## 2014-11-09 VITALS — BP 142/86 | HR 82 | Temp 97.5°F | Ht 71.0 in | Wt 254.0 lb

## 2014-11-09 DIAGNOSIS — I872 Venous insufficiency (chronic) (peripheral): Secondary | ICD-10-CM

## 2014-11-09 DIAGNOSIS — B182 Chronic viral hepatitis C: Secondary | ICD-10-CM

## 2014-11-09 DIAGNOSIS — Z23 Encounter for immunization: Secondary | ICD-10-CM

## 2014-11-09 DIAGNOSIS — B1920 Unspecified viral hepatitis C without hepatic coma: Secondary | ICD-10-CM

## 2014-11-09 DIAGNOSIS — L03119 Cellulitis of unspecified part of limb: Secondary | ICD-10-CM

## 2014-11-09 DIAGNOSIS — L03116 Cellulitis of left lower limb: Secondary | ICD-10-CM

## 2014-11-09 NOTE — Addendum Note (Signed)
Addended by: Wendall MolaOCKERHAM, JACQUELINE A on: 11/09/2014 11:12 AM   Modules accepted: Orders

## 2014-11-09 NOTE — Assessment & Plan Note (Signed)
He will return in about 3 months when he returns to [place] from [place]. I will get the fiber sure blood tests today and then we can start the process when he returns in 3 months.

## 2014-11-09 NOTE — Assessment & Plan Note (Addendum)
He has swelling of his left leg and some skin changes and I question if he has some vascular insufficiency. He could make him prone to infections. I discussed this with him and will have him seen by vascular surgery.    I discussed with him that at present there is no active infection so no indication for antibiotics. I'm not sure at this time why his left leg swells. His right leg does not swell and no history of congestive heart failure. He does have skin changes that make me consider venous insufficiency. He will follow up when he has active infection.

## 2014-11-09 NOTE — Telephone Encounter (Signed)
Patient notified of appointment with [facility]  with Dr. Arbie CookeyEarly on 11/23/14 at 3:00 pm and for procedure at 11:15 AM. He was provided with address and phone number.

## 2014-11-09 NOTE — Progress Notes (Signed)
   Subjective:    Patient ID: [name] PennerGary M [name], male    DOB: [birth year], 39 y.o.   MRN: [id number]  HPI He comes in here for a new patient visit. He previously has been seen for hepatitis C. He is here now with recurrent cellulitis.  He was hospitalized in the January with cellulitis of his left leg. He reports this is about the third episode of cellulitis. He describes a swelling, erythema and pain of his leg and swells up much larger than current. He gets fever and chills associated with this. He is frustrated with having recurrent cellulitis.  He has been recently on Bactrim. Prior to last year, he did not have any episodes of cellulitis.  He has been seen in the past for hepatitis C.  He was going to continue with drug and alcohol abuse program however he did not return for follow-up and no prescription was ever sent. He was sent for elastography but was not able to comply with the breathing requirements to get it done. [facility] in [facility] and [facility] abuse and is going well. He states he is going to a program in [place] in 2 weeks and he will be there about 3 months. He had 1 relapse but otherwise has had good control of his drug and alcohol abuse.   Review of Systems  Constitutional: Negative for fever, chills and fatigue.  Gastrointestinal: Negative for nausea and diarrhea.  Musculoskeletal:       Left leg with nonpitting edema, a patch of erythema that is not warm and not bright red consistent with vascular insufficiency.  Skin: Negative for rash.  Neurological: Negative for dizziness and light-headedness.       Objective:   Physical Exam  Constitutional: He appears well-developed and well-nourished. No distress.  Eyes: No scleral icterus.  Cardiovascular: Normal rate, regular rhythm and normal heart sounds.   No murmur heard. Pulmonary/Chest: Effort normal and breath sounds normal. No respiratory distress.  Musculoskeletal:  Left leg with 1+ edema. Some blanching. No  warmth, no significant erythema. Skin changes   Skin: No rash noted.          Assessment & Plan:

## 2014-11-13 LAB — LIVER FIBROSIS, FIBROTEST-ACTITEST
ALPHA-2-MACROGLOBULIN: 313 mg/dL — AB (ref 106–279)
ALT: 141 U/L — AB (ref 9–46)
APOLIPOPROTEIN A1: 148 mg/dL (ref 94–176)
Bilirubin: 0.4 mg/dL (ref 0.2–1.2)
Fibrosis Score: 0.47
GGT: 121 U/L — ABNORMAL HIGH (ref 3–90)
Haptoglobin: 86 mg/dL (ref 43–212)
Necroinflammat ACT Score: 0.75
REFERENCE ID: 1114128

## 2014-11-16 ENCOUNTER — Other Ambulatory Visit: Payer: Self-pay | Admitting: *Deleted

## 2014-11-16 DIAGNOSIS — L03116 Cellulitis of left lower limb: Secondary | ICD-10-CM

## 2014-11-22 ENCOUNTER — Encounter: Payer: Self-pay | Admitting: Vascular Surgery

## 2014-11-23 ENCOUNTER — Ambulatory Visit (HOSPITAL_COMMUNITY)
Admission: RE | Admit: 2014-11-23 | Discharge: 2014-11-23 | Disposition: A | Discharge status: Home / Self Care | Payer: Medicare Other | Source: Ambulatory Visit | Attending: Vascular Surgery | Admitting: Vascular Surgery

## 2014-11-23 ENCOUNTER — Other Ambulatory Visit: Payer: Self-pay | Admitting: Vascular Surgery

## 2014-11-23 ENCOUNTER — Ambulatory Visit (INDEPENDENT_AMBULATORY_CARE_PROVIDER_SITE_OTHER): Payer: Medicare Other | Admitting: Vascular Surgery

## 2014-11-23 ENCOUNTER — Encounter: Payer: Self-pay | Admitting: Vascular Surgery

## 2014-11-23 VITALS — BP 125/79 | HR 79 | Resp 18 | Ht 69.5 in | Wt 250.7 lb

## 2014-11-23 DIAGNOSIS — R600 Localized edema: Secondary | ICD-10-CM

## 2014-11-23 DIAGNOSIS — L03116 Cellulitis of left lower limb: Secondary | ICD-10-CM

## 2014-11-23 NOTE — Progress Notes (Signed)
Patient name: [name] [name] MRN: [id number] DOB: July 11, 1976 Sex: male   Referred by: Luciana Axe  Reason for referral:  Chief Complaint  Patient presents with  . New Evaluation    Left LE reoccuring cellulitis (3 hospital admissions for cellulitis since 12-2013),  left calf, ankle, foot edematous     HISTORY OF PRESENT ILLNESS: Patient is today for evaluation of potential vascular cause for his recurrent episodes of cellulitis. Patient reports 3 separate hospital admissions since March of last year with cellulitis in his left calf and ankle. He reports that he has had the duplex studies in the past to rule out DVT and these have all been negative. He does not recall any specific trauma related to the cellulitis events. This is spontaneous and does improve with antibiotic treatment. He does report some swelling chronically his left calf.  Past Medical History  Diagnosis Date  . Bipolar disorder   . Hepatitis C   . Depression   . Cellulitis     Past Surgical History  Procedure Laterality Date  . Back surgery    . Shoulder surgery      History   Social History  . Marital Status: Divorced    Spouse Name: N/A    Number of Children: N/A  . Years of Education: N/A   Occupational History  . Not on file.   Social History Main Topics  . Smoking status: Current Every Day Smoker -- 1.00 packs/day    Types: Cigarettes    Start date: 10/22/1981  . Smokeless tobacco: Never Used  . Alcohol Use: 0.0 oz/week    0 Not specified per week     Comment: pt states approx a case a day/none since 07/2014 in treatment resides at [facility]  . Drug Use: Yes    Special: Marijuana, Benzodiazepines, Other-see comments, Methamphetamines     Comment: "pills"  . Sexual Activity: Not Currently   Other Topics Concern  . Not on file   Social History Narrative    Family History  Problem Relation Age of Onset  . Alcohol abuse Father   . Cancer Maternal Aunt     Allergies as of 11/23/2014 -  Review Complete 11/23/2014  Allergen Reaction Noted  . Lithium Anaphylaxis 01/26/2013  . Penicillins Rash 01/26/2013    Current Outpatient Prescriptions on File Prior to Visit  Medication Sig Dispense Refill  . buPROPion (WELLBUTRIN XL) 300 MG 24 hr tablet Take 1 tablet (300 mg total) by mouth daily. 30 tablet 0  . fluticasone (FLONASE) 50 MCG/ACT nasal spray Place 2 sprays into both nostrils daily. 16 g 6  . gabapentin (NEURONTIN) 400 MG capsule Take 2 capsules (800 mg total) by mouth 4 (four) times daily. 240 capsule 0  . hydrOXYzine (ATARAX/VISTARIL) 25 MG tablet Take 1 tablet (25 mg total) by mouth every 6 (six) hours as needed for anxiety. 30 tablet 0  . ibuprofen (ADVIL,MOTRIN) 800 MG tablet Take 1 tablet (800 mg total) by mouth every 8 (eight) hours as needed for mild pain or moderate pain. 30 tablet 0  . methocarbamol (ROBAXIN) 500 MG tablet Take 1 tablet (500 mg total) by mouth every 8 (eight) hours as needed for muscle spasms. 20 tablet 0  . Multiple Vitamin (MULTIVITAMIN WITH MINERALS) TABS tablet Take 1 tablet by mouth daily.    . pantoprazole (PROTONIX) 40 MG tablet Take 1 tablet (40 mg total) by mouth daily at 6 (six) AM. 30 tablet 0  . sertraline (ZOLOFT) 100 MG  tablet Take 1 tablet (100 mg total) by mouth daily. 30 tablet 0  . traZODone (DESYREL) 100 MG tablet Take 100 mg by mouth at bedtime.    . furosemide (LASIX) 20 MG tablet Take 1 tablet (20 mg total) by mouth daily. (Patient not taking: Reported on 11/09/2014) 5 tablet 0   No current facility-administered medications on file prior to visit.     REVIEW OF SYSTEMS:  Positives indicated with an "X"  CARDIOVASCULAR:  [ ]  chest pain   [ ]  chest pressure   [ ]  palpitations   [ ]  orthopnea   [ ]  dyspnea on exertion   [ ]  claudication   [ ]  rest pain   [ ]  DVT   [ ]  phlebitis PULMONARY:   [ ]  productive cough   [ ]  asthma   [ ]  wheezing NEUROLOGIC:   [ ]  weakness  [ ]  paresthesias  [ ]  aphasia  [ ]  amaurosis  [ ]   dizziness HEMATOLOGIC:   [ ]  bleeding problems   [ ]  clotting disorders MUSCULOSKELETAL:  [ ]  joint pain   [ ]  joint swelling GASTROINTESTINAL: [ ]   blood in stool  [ ]   hematemesis GENITOURINARY:  [ ]   dysuria  [ ]   hematuria PSYCHIATRIC:  [x ] history of major depression INTEGUMENTARY:  [ ]  rashes  [ ]  ulcers CONSTITUTIONAL:  [ ]  fever   [ ]  chills  PHYSICAL EXAMINATION:  General: The patient is a well-nourished male, in no acute distress. Vital signs are BP 125/79 mmHg  Pulse 79  Resp 18  Ht 5' 9.5" (1.765 m)  Wt 250 lb 11.2 oz (113.717 kg)  BMI 36.50 kg/m2 Pulmonary: There is a good air exchange   Musculoskeletal: There are no major deformities.  There is no significant extremity pain. Neurologic: No focal weakness or paresthesias are detected, Skin: There are no ulcer or rashes noted. does have some mild erythema over his left calf with no skin breakdown and no cellulitis  Psychiatric: The patient has normal affect. Cardiovascular2+ radial 2+ dorsalis pedis pulses bilaterally VVS Vascular Lab Studies:  Ordered and Independently Reviewed no evidence of deep venous thrombosis or occlusion. No evidence of venous incompetence in the superficial system on the left leg   Impression and PlaI discussed this at length with the patient. I explained that do not see any evidence of any pathology and is arterial or venous system that could explain his recurrent bouts of cellulitis. I did explain some potential benefit from left knee-high compression if he has progressive swelling. At his current level of swelling I feel that this is not indicated. He was reassured this discussion will see us again on as-needed basis    Stacey Sago [facility] [place] Office: [phone]

## 2014-12-25 ENCOUNTER — Encounter (HOSPITAL_COMMUNITY): Payer: Self-pay | Admitting: *Deleted

## 2014-12-25 ENCOUNTER — Emergency Department (HOSPITAL_COMMUNITY)
Admission: EM | Admit: 2014-12-25 | Discharge: 2014-12-27 | Disposition: A | Discharge status: Home / Self Care | Payer: Medicare Other | Attending: Emergency Medicine | Admitting: Emergency Medicine

## 2014-12-25 DIAGNOSIS — Z8619 Personal history of other infectious and parasitic diseases: Secondary | ICD-10-CM | POA: Insufficient documentation

## 2014-12-25 DIAGNOSIS — F1020 Alcohol dependence, uncomplicated: Secondary | ICD-10-CM

## 2014-12-25 DIAGNOSIS — Z880 Allergy status to penicillin: Secondary | ICD-10-CM | POA: Diagnosis not present

## 2014-12-25 DIAGNOSIS — Z720 Tobacco use: Secondary | ICD-10-CM | POA: Insufficient documentation

## 2014-12-25 DIAGNOSIS — Z79899 Other long term (current) drug therapy: Secondary | ICD-10-CM | POA: Insufficient documentation

## 2014-12-25 DIAGNOSIS — F1024 Alcohol dependence with alcohol-induced mood disorder: Secondary | ICD-10-CM | POA: Diagnosis present

## 2014-12-25 DIAGNOSIS — Y909 Presence of alcohol in blood, level not specified: Secondary | ICD-10-CM | POA: Diagnosis not present

## 2014-12-25 DIAGNOSIS — Z872 Personal history of diseases of the skin and subcutaneous tissue: Secondary | ICD-10-CM | POA: Insufficient documentation

## 2014-12-25 DIAGNOSIS — R45851 Suicidal ideations: Secondary | ICD-10-CM | POA: Diagnosis present

## 2014-12-25 DIAGNOSIS — F1110 Opioid abuse, uncomplicated: Secondary | ICD-10-CM

## 2014-12-25 LAB — COMPREHENSIVE METABOLIC PANEL
ALT: 76 U/L — ABNORMAL HIGH (ref 0–53)
ANION GAP: 8 (ref 5–15)
AST: 70 U/L — AB (ref 0–37)
Albumin: 4 g/dL (ref 3.5–5.2)
Alkaline Phosphatase: 64 U/L (ref 39–117)
BUN: 25 mg/dL — AB (ref 6–23)
CALCIUM: 8.9 mg/dL (ref 8.4–10.5)
CO2: 27 mmol/L (ref 19–32)
Chloride: 105 mmol/L (ref 96–112)
Creatinine, Ser: 0.91 mg/dL (ref 0.50–1.35)
GFR calc non Af Amer: >90 mL/min (ref >90)
GLUCOSE: 99 mg/dL (ref 70–99)
Potassium: 3.9 mmol/L (ref 3.5–5.1)
Sodium: 140 mmol/L (ref 135–145)
TOTAL PROTEIN: 7.3 g/dL (ref 6.0–8.3)
Total Bilirubin: 0.6 mg/dL (ref 0.3–1.2)

## 2014-12-25 LAB — RAPID URINE DRUG SCREEN, HOSP PERFORMED
Amphetamines: NOT DETECTED
Barbiturates: NOT DETECTED
Benzodiazepines: NOT DETECTED
COCAINE: POSITIVE — AB
Opiates: POSITIVE — AB
TETRAHYDROCANNABINOL: NOT DETECTED

## 2014-12-25 LAB — CBC
HCT: 42.6 % (ref 39.0–52.0)
HEMOGLOBIN: 14.4 g/dL (ref 13.0–17.0)
MCH: 32.4 pg (ref 26.0–34.0)
MCHC: 33.8 g/dL (ref 30.0–36.0)
MCV: 95.7 fL (ref 78.0–100.0)
Platelets: 201 10*3/uL (ref 150–400)
RBC: 4.45 MIL/uL (ref 4.22–5.81)
RDW: 13.6 % (ref 11.5–15.5)
WBC: 20.6 10*3/uL — ABNORMAL HIGH (ref 4.0–10.5)

## 2014-12-25 LAB — SALICYLATE LEVEL

## 2014-12-25 LAB — ETHANOL: Alcohol, Ethyl (B): <5 mg/dL (ref 0–9)

## 2014-12-25 LAB — ACETAMINOPHEN LEVEL: Acetaminophen (Tylenol), Serum: <10 ug/mL — ABNORMAL LOW (ref 10–30)

## 2014-12-25 MED ORDER — HYDROXYZINE HCL 25 MG PO TABS
25.0000 mg | ORAL_TABLET | Freq: Four times a day (QID) | ORAL | Status: DC | PRN
  Administered 2014-12-26: 25 mg via ORAL
  Filled 2014-12-25: qty 1

## 2014-12-25 MED ORDER — TRAZODONE HCL 100 MG PO TABS
100.0000 mg | ORAL_TABLET | Freq: Every day | ORAL | Status: DC
  Administered 2014-12-25 – 2014-12-26 (×2): 100 mg via ORAL
  Filled 2014-12-25 (×2): qty 1

## 2014-12-25 MED ORDER — CHLORDIAZEPOXIDE HCL 25 MG PO CAPS: 25.0000 mg | ORAL_CAPSULE | Freq: Every day | ORAL | Status: DC

## 2014-12-25 MED ORDER — SERTRALINE HCL 50 MG PO TABS
100.0000 mg | ORAL_TABLET | Freq: Every day | ORAL | Status: DC
  Administered 2014-12-25 – 2014-12-27 (×3): 100 mg via ORAL
  Filled 2014-12-25 (×3): qty 2

## 2014-12-25 MED ORDER — ZOLPIDEM TARTRATE 5 MG PO TABS: 5.0000 mg | ORAL_TABLET | Freq: Every evening | ORAL | Status: DC | PRN

## 2014-12-25 MED ORDER — VITAMIN B-1 100 MG PO TABS
100.0000 mg | ORAL_TABLET | Freq: Every day | ORAL | Status: DC
  Administered 2014-12-26 – 2014-12-27 (×2): 100 mg via ORAL
  Filled 2014-12-25 (×2): qty 1

## 2014-12-25 MED ORDER — HYDROXYZINE HCL 25 MG PO TABS: 25.0000 mg | ORAL_TABLET | Freq: Four times a day (QID) | ORAL | Status: DC | PRN

## 2014-12-25 MED ORDER — IBUPROFEN 200 MG PO TABS: 600.0000 mg | ORAL_TABLET | Freq: Three times a day (TID) | ORAL | Status: DC | PRN

## 2014-12-25 MED ORDER — ONDANSETRON HCL 4 MG PO TABS
4.0000 mg | ORAL_TABLET | Freq: Three times a day (TID) | ORAL | Status: DC | PRN
  Administered 2014-12-26: 4 mg via ORAL
  Filled 2014-12-25: qty 1

## 2014-12-25 MED ORDER — ACETAMINOPHEN 325 MG PO TABS: 650.0000 mg | ORAL_TABLET | ORAL | Status: DC | PRN

## 2014-12-25 MED ORDER — ONDANSETRON 4 MG PO TBDP
4.0000 mg | ORAL_TABLET | Freq: Four times a day (QID) | ORAL | Status: DC | PRN
Start: 2014-12-25 — End: 2014-12-27
  Administered 2014-12-25 – 2014-12-26 (×2): 4 mg via ORAL
  Filled 2014-12-25 (×2): qty 1

## 2014-12-25 MED ORDER — ADULT MULTIVITAMIN W/MINERALS CH
1.0000 | ORAL_TABLET | Freq: Every day | ORAL | Status: DC
  Administered 2014-12-25 – 2014-12-27 (×3): 1 via ORAL
  Filled 2014-12-25 (×3): qty 1

## 2014-12-25 MED ORDER — CHLORDIAZEPOXIDE HCL 25 MG PO CAPS: 25.0000 mg | ORAL_CAPSULE | ORAL | Status: DC

## 2014-12-25 MED ORDER — LOPERAMIDE HCL 2 MG PO CAPS: 2.0000 mg | ORAL_CAPSULE | ORAL | Status: DC | PRN

## 2014-12-25 MED ORDER — CHLORDIAZEPOXIDE HCL 25 MG PO CAPS: 25.0000 mg | ORAL_CAPSULE | Freq: Four times a day (QID) | ORAL | Status: DC | PRN

## 2014-12-25 MED ORDER — GABAPENTIN 400 MG PO CAPS
800.0000 mg | ORAL_CAPSULE | Freq: Four times a day (QID) | ORAL | Status: DC
  Administered 2014-12-25 – 2014-12-27 (×6): 800 mg via ORAL
  Filled 2014-12-25 (×6): qty 2
  Filled 2014-12-25: qty 8

## 2014-12-25 MED ORDER — CHLORDIAZEPOXIDE HCL 25 MG PO CAPS
25.0000 mg | ORAL_CAPSULE | Freq: Four times a day (QID) | ORAL | Status: AC
  Administered 2014-12-25 – 2014-12-26 (×5): 25 mg via ORAL
  Filled 2014-12-25 (×5): qty 1

## 2014-12-25 MED ORDER — NICOTINE 21 MG/24HR TD PT24
21.0000 mg | MEDICATED_PATCH | Freq: Every day | TRANSDERMAL | Status: DC | PRN
  Administered 2014-12-26: 21 mg via TRANSDERMAL
  Filled 2014-12-25: qty 1

## 2014-12-25 MED ORDER — ALUM & MAG HYDROXIDE-SIMETH 200-200-20 MG/5ML PO SUSP: 30.0000 mL | ORAL | Status: DC | PRN

## 2014-12-25 MED ORDER — THIAMINE HCL 100 MG/ML IJ SOLN
100.0000 mg | Freq: Once | INTRAMUSCULAR | Status: AC
  Administered 2014-12-25: 100 mg via INTRAMUSCULAR
  Filled 2014-12-25: qty 2

## 2014-12-25 MED ORDER — CHLORDIAZEPOXIDE HCL 25 MG PO CAPS
25.0000 mg | ORAL_CAPSULE | Freq: Three times a day (TID) | ORAL | Status: DC
  Administered 2014-12-27: 25 mg via ORAL
  Filled 2014-12-25: qty 1

## 2014-12-25 MED ORDER — CHLORDIAZEPOXIDE HCL 25 MG PO CAPS
50.0000 mg | ORAL_CAPSULE | Freq: Once | ORAL | Status: AC
  Administered 2014-12-25: 50 mg via ORAL
  Filled 2014-12-25: qty 2

## 2014-12-25 MED ORDER — BUPROPION HCL ER (XL) 300 MG PO TB24
300.0000 mg | ORAL_TABLET | Freq: Every day | ORAL | Status: DC
  Administered 2014-12-26 – 2014-12-27 (×2): 300 mg via ORAL
  Filled 2014-12-25 (×5): qty 1

## 2014-12-25 MED ORDER — FUROSEMIDE 20 MG PO TABS
20.0000 mg | ORAL_TABLET | Freq: Every day | ORAL | Status: DC
  Filled 2014-12-25 (×3): qty 1
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
## 2014-12-25 NOTE — ED Provider Notes (Signed)
CSN: [id number]     Arrival date & time 12/25/14  1328 History   First MD Initiated Contact with Patient 12/25/14 1548     Chief Complaint  Patient presents with  . detox     . Suicidal     (Consider location/radiation/quality/duration/timing/severity/associated sxs/prior Treatment) HPI  [name] [name] is a 39 y.o. male who presents for detoxification from alcohol and heroin. He last used alcohol yesterday evening, and heparin at 6 AM this morning. He states that he has a residence. 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 He also uses cocaine occasionally. He drinks both beer and whiskey every day. He uses heroin 3-4 times a day. He denies recent illnesses. He last saw his psychiatrist at [facility], one month ago. He is not seeing a therapist. He has been to AA, but not in the past month. He has thoughts that he will kill himself if he can't stay away from drugs. He does not have an active suicidal plan.    Past Medical History  Diagnosis Date  . Bipolar disorder   . Hepatitis C   . Depression   . Cellulitis    Past Surgical History  Procedure Laterality Date  . Back surgery    . Shoulder surgery     Family History  Problem Relation Age of Onset  . Alcohol abuse Father   . Cancer Maternal Aunt    History  Substance Use Topics  . Smoking status: Current Every Day Smoker -- 1.00 packs/day    Types: Cigarettes    Start date: 10/22/1981  . Smokeless tobacco: Never Used  . Alcohol Use: 0.0 oz/week    0 Standard drinks or equivalent per week     Comment: pt states approx a case a day/none since 07/2014 in treatment resides at [facility]    Review of Systems  All other systems reviewed and are negative.     Allergies  Lithium and Penicillins  Home Medications   Prior to Admission medications   Medication Sig Start Date End Date Taking? Authorizing Provider  buPROPion (WELLBUTRIN SR) 150 MG 12 hr tablet Take 450 mg by mouth daily.   Yes Historical Provider, MD  gabapentin  (NEURONTIN) 400 MG capsule Take 2 capsules (800 mg total) by mouth 4 (four) times daily. 09/13/14  Yes Velna Hatchet May Agustin, NP  methocarbamol (ROBAXIN) 500 MG tablet Take 1 tablet (500 mg total) by mouth every 8 (eight) hours as needed for muscle spasms. 10/23/14  Yes Rodolph Bong, MD  sertraline (ZOLOFT) 100 MG tablet Take 1 tablet (100 mg total) by mouth daily. 09/14/14  Yes Velna Hatchet May Agustin, NP  traZODone (DESYREL) 100 MG tablet Take 100 mg by mouth at bedtime.   Yes Historical Provider, MD  buPROPion (WELLBUTRIN XL) 300 MG 24 hr tablet Take 1 tablet (300 mg total) by mouth daily. Patient not taking: Reported on 12/25/2014 09/14/14   Velna Hatchet May Agustin, NP  fluticasone [facility]) 50 MCG/ACT nasal spray Place 2 sprays into both nostrils daily. Patient not taking: Reported on 12/25/2014 10/25/14   Ambrose Finland, NP  furosemide (LASIX) 20 MG tablet Take 1 tablet (20 mg total) by mouth daily. Patient not taking: Reported on 11/09/2014 10/25/14   Ambrose Finland, NP  hydrOXYzine (ATARAX/VISTARIL) 25 MG tablet Take 1 tablet (25 mg total) by mouth every 6 (six) hours as needed for anxiety. Patient not taking: Reported on 12/25/2014 09/13/14   Lindwood Qua, NP  ibuprofen (ADVIL,MOTRIN) 800 MG tablet Take  1 tablet (800 mg total) by mouth every 8 (eight) hours as needed for mild pain or moderate pain. Patient not taking: Reported on 12/25/2014 10/23/14   Rodolph Bonganiel Thompson V, MD  Multiple Vitamin (MULTIVITAMIN WITH MINERALS) TABS tablet Take 1 tablet by mouth daily.    Historical Provider, MD  pantoprazole (PROTONIX) 40 MG tablet Take 1 tablet (40 mg total) by mouth daily at 6 (six) AM. Patient not taking: Reported on 12/25/2014 10/23/14   Rodolph Bonganiel Thompson V, MD   BP 122/48 mmHg  Pulse 76  Temp(Src) 98 F (36.7 C) (Oral)  Resp 18  SpO2 95% Physical Exam  Constitutional: He is oriented to person, place, and time. He appears well-developed and well-nourished. No distress.  HENT:  Head: Normocephalic and  atraumatic.  Right Ear: External ear normal.  Left Ear: External ear normal.  Eyes: Conjunctivae and EOM are normal. Pupils are equal, round, and reactive to light.  Neck: Normal range of motion and phonation normal. Neck supple.  Cardiovascular: Normal rate, regular rhythm and normal heart sounds.   Pulmonary/Chest: Effort normal and breath sounds normal. No respiratory distress. He has no wheezes. He exhibits no bony tenderness.  Abdominal: Soft. There is no tenderness.  Musculoskeletal: Normal range of motion.  Neurological: He is alert and oriented to person, place, and time. No cranial nerve deficit or sensory deficit. He exhibits normal muscle tone. Coordination normal.  No dysarthria and aphasia or nystagmus.  Skin: Skin is warm, dry and intact.  Psychiatric: He has a normal mood and affect. His behavior is normal. Judgment and thought content normal.  Nursing note and vitals reviewed.   ED Course  Procedures (including critical care time)  16:10-no sign of severe withdrawal symptoms. We will initiate treatment for alcohol withdrawal, monitor and consider disposition after completion of screening evaluation.  Medications  thiamine (VITAMIN B-1) tablet 100 mg (not administered)  multivitamin with minerals tablet 1 tablet (1 tablet Oral Given 12/25/14 1752)  chlordiazePOXIDE (LIBRIUM) capsule 25 mg (not administered)  hydrOXYzine (ATARAX/VISTARIL) tablet 25 mg (not administered)  loperamide (IMODIUM) capsule 2-4 mg (not administered)  ondansetron (ZOFRAN-ODT) disintegrating tablet 4 mg (4 mg Oral Given 12/25/14 2050)  chlordiazePOXIDE (LIBRIUM) capsule 25 mg (25 mg Oral Given 12/25/14 2145)    Followed by  chlordiazePOXIDE (LIBRIUM) capsule 25 mg (not administered)    Followed by  chlordiazePOXIDE (LIBRIUM) capsule 25 mg (not administered)    Followed by  chlordiazePOXIDE (LIBRIUM) capsule 25 mg (not administered)  acetaminophen (TYLENOL) tablet 650 mg (not administered)  ibuprofen  (ADVIL,MOTRIN) tablet 600 mg (not administered)  zolpidem (AMBIEN) tablet 5 mg (not administered)  nicotine (NICODERM CQ - dosed in mg/24 hours) patch 21 mg (not administered)  ondansetron (ZOFRAN) tablet 4 mg (not administered)  alum & mag hydroxide-simeth (MAALOX/MYLANTA) 200-200-20 MG/5ML suspension 30 mL (not administered)  buPROPion (WELLBUTRIN XL) 24 hr tablet 300 mg (300 mg Oral Not Given 12/25/14 2149)  furosemide (LASIX) tablet 20 mg (20 mg Oral Not Given 12/25/14 2150)  gabapentin (NEURONTIN) capsule 800 mg (800 mg Oral Given 12/25/14 2145)  hydrOXYzine (ATARAX/VISTARIL) tablet 25 mg (not administered)  sertraline (ZOLOFT) tablet 100 mg (100 mg Oral Given 12/25/14 2145)  traZODone (DESYREL) tablet 100 mg (100 mg Oral Given 12/25/14 2145)  thiamine (B-1) injection 100 mg (100 mg Intramuscular Given 12/25/14 1752)  chlordiazePOXIDE (LIBRIUM) capsule 50 mg (50 mg Oral Given 12/25/14 1752)    Patient Vitals for the past 24 hrs:  BP Temp Temp src Pulse Resp SpO2  12/25/14 2041 (!) 122/48 mmHg 98 F (36.7 C) Oral 76 18 95 %  12/25/14 1825 150/64 mmHg - - 73 16 95 %  12/25/14 1352 (!) 133/53 mmHg 98.2 F (36.8 C) Oral 97 16 92 %   TTS consult  8:48 PM Reevaluation with update and discussion. After initial assessment and treatment, an updated evaluation reveals patient now states that he has suicidal plan to overdose on medications. He has been seen by TTS. They plan on having him evaluated in the morning by psychiatry. He is calm and lucid at this time. He is not tremorous. Clela Hagadorn L    Labs Review Labs Reviewed  ACETAMINOPHEN LEVEL - Abnormal; Notable for the following:    Acetaminophen (Tylenol), Serum <10.0 (*)    All other components within normal limits  CBC - Abnormal; Notable for the following:    WBC 20.6 (*)    All other components within normal limits  COMPREHENSIVE METABOLIC PANEL - Abnormal; Notable for the following:    BUN 25 (*)    AST 70 (*)    ALT 76 (*)    All  other components within normal limits  URINE RAPID DRUG SCREEN (HOSP PERFORMED) - Abnormal; Notable for the following:    Opiates POSITIVE (*)    Cocaine POSITIVE (*)    All other components within normal limits  ETHANOL  SALICYLATE LEVEL    Imaging Review No results found.   EKG Interpretation None      MDM   Final diagnoses:  Suicidal ideation  Heroin abuse  Alcoholism    Polysubstance  Abuse. Variable suicide ideation, initially without plan, following by plan, after he was informed that his alcohol detoxification would be done as an outpatient.  Nursing Notes Reviewed/ Care Coordinated, and agree without changes. Applicable Imaging Reviewed.  Interpretation of Laboratory Data incorporated into ED treatment  Plan- psychiatry consultation in the morning. Monitored overnight in the psychiatric unit.    Flint Melter, MD 12/25/14 [phone]

## 2014-12-25 NOTE — ED Notes (Signed)
Sleeping, easily aroused, sandwich/soda given

## 2014-12-25 NOTE — ED Notes (Signed)
Pt unable to urinate at this time.  

## 2014-12-25 NOTE — BH Assessment (Signed)
Clinician met with patient to provide outpatient referrals and suicide prevention packet for discharge. Patient stated "if yall send me out this door I'm going to F--ing kill myself." Clinician inquired how he would kill himself patient stated "I'll overdose on heroin, I'll do something." Clinician reminded patient of accessing for suicide during assessment and patient denied and asked what changed. Patient stated "I didn't come in here for nothing." Clinician provided patient with option to take his discharge paperwork to [facility] or [facility] for possible admission. Clinician again stated "I'll kill myself if I leave here."   Clinician updated Dr. Eulis Foster who stated he would talk to patient.   Per [name] Agent, [name] [name] patient can be reevaluated in AM by psychiatry.   [name] [name], MSW, LCSW Triage Specialist [phone],

## 2014-12-25 NOTE — ED Notes (Signed)
Patient complains of nausea no vomiting at this time. Respirations equal and unlabored, skin warm and dry. NAD. Will medicated patient per MAR. Q 15 min safety checks remain in place.
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
## 2014-12-25 NOTE — BH Assessment (Addendum)
Clinician met with patient to complete TTS assessment. Clinician reviewed patient with Reginold Agent, NP who stated patient does not meet inpatient criteria and recommends patient be [facility] with outpatient referrals when medically cleared. Clinician also reviewed patient with Dr. Eulis Foster who agreed with disposition. Clinician informed MD that patient is requesting meds for his w/d sx. Clinician to provide patient with referrals.  Rigoberto Noel, MSW, LCSW Triage Specialist [phone],

## 2014-12-26 DIAGNOSIS — R45851 Suicidal ideations: Secondary | ICD-10-CM | POA: Insufficient documentation

## 2014-12-26 DIAGNOSIS — F1024 Alcohol dependence with alcohol-induced mood disorder: Secondary | ICD-10-CM

## 2014-12-26 LAB — CBC WITH DIFFERENTIAL/PLATELET
BASOS ABS: 0 10*3/uL (ref 0.0–0.1)
Basophils Relative: 0 % (ref 0–1)
EOS ABS: 0.3 10*3/uL (ref 0.0–0.7)
EOS PCT: 4 % (ref 0–5)
HEMATOCRIT: 42.1 % (ref 39.0–52.0)
HEMOGLOBIN: 14.1 g/dL (ref 13.0–17.0)
LYMPHS PCT: 32 % (ref 12–46)
Lymphs Abs: 2.7 10*3/uL (ref 0.7–4.0)
MCH: 32.1 pg (ref 26.0–34.0)
MCHC: 33.5 g/dL (ref 30.0–36.0)
MCV: 95.9 fL (ref 78.0–100.0)
MONO ABS: 0.7 10*3/uL (ref 0.1–1.0)
MONOS PCT: 9 % (ref 3–12)
Neutro Abs: 4.6 10*3/uL (ref 1.7–7.7)
Neutrophils Relative %: 55 % (ref 43–77)
PLATELETS: 159 10*3/uL (ref 150–400)
RBC: 4.39 MIL/uL (ref 4.22–5.81)
RDW: 13.4 % (ref 11.5–15.5)
WBC: 8.3 10*3/uL (ref 4.0–10.5)

## 2014-12-26 LAB — COMPREHENSIVE METABOLIC PANEL
ALBUMIN: 3.5 g/dL (ref 3.5–5.2)
ALT: 75 U/L — AB (ref 0–53)
AST: 69 U/L — ABNORMAL HIGH (ref 0–37)
Alkaline Phosphatase: 58 U/L (ref 39–117)
BUN: 13 mg/dL (ref 6–23)
CHLORIDE: 107 mmol/L (ref 96–112)
CO2: 31 mmol/L (ref 19–32)
Calcium: 8.5 mg/dL (ref 8.4–10.5)
Creatinine, Ser: 0.74 mg/dL (ref 0.50–1.35)
GFR calc Af Amer: >90 mL/min (ref >90)
GFR calc non Af Amer: >90 mL/min (ref >90)
Glucose, Bld: 136 mg/dL — ABNORMAL HIGH (ref 70–99)
Potassium: 3.8 mmol/L (ref 3.5–5.1)
SODIUM: 139 mmol/L (ref 135–145)
TOTAL PROTEIN: 6.4 g/dL (ref 6.0–8.3)
Total Bilirubin: 0.8 mg/dL (ref 0.3–1.2)

## 2014-12-26 NOTE — ED Notes (Signed)
Pt resting on bed, AAO x 3, no distress noted, complaint of nausea, meds given.  Will continue to monitor for safety.

## 2014-12-26 NOTE — Consult Note (Signed)
[facility] Face-to-Face Psychiatry Consult   Reason for Consult:  Substance induced mood disorder Referring Physician:  EDP Patient Identification: BRANDELL MAREADY MRN:  [id number] Principal Diagnosis: Alcohol dependence with alcohol-induced mood disorder Diagnosis:   Patient Active Problem List   Diagnosis Date Noted  . Alcohol dependence with alcohol-induced mood disorder [F10.24]     Priority: High  . Recurrent cellulitis of lower leg [L03.119] 11/09/2014  . Venous (peripheral) insufficiency [I87.2] 11/09/2014  . Bipolar affective disorder [F31.9]   . Opioid dependence with opioid-induced mood disorder [F11.24]   . MDD (major depressive disorder), recurrent severe, without psychosis [F33.2] 09/09/2014  . MDD (major depressive disorder) [F32.2] 09/09/2014  . Suicidal ideations [R45.851]   . Chronic pain syndrome [G89.4] 05/31/2014  . Tobacco dependence [F17.200] 05/30/2014  . Poor dentition [K08.8] 02/03/2014  . Hepatitis C virus infection without hepatic coma [B19.20] 02/03/2014  . Sepsis [A41.9] 01/29/2014  . Anxiety state, unspecified [F41.1] 01/31/2013  . Mood disorder in conditions classified elsewhere [F06.30] 01/31/2013  . Polysubstance (including opioids) dependence with physiological dependence [F19.20] 01/27/2013    Class: Acute  . Alcohol withdrawal [F10.239] 01/27/2013    Class: Acute    Total Time spent with patient: 45 minutes  Subjective:   [name] [name] is a 39 y.o. male patient admitted with Substance induced mood disorder.  HPI: Caucasian male, 39 years old was evaluated this morning for suicidal ideation and thought after he was informed we no longer do substance detox. He reported that his last use of Heroin was yesterday morning at 6 am and that he drank 1 pint of Liquor at the time. Patient reported that he has been having suicidal thoughts off and on for a month.  He also requested detox from substances and was informed we no longer offer the services.   He was  planning to OD on Heroin yesterday but denies that today.  Patient reported that he relapsed after leaving [name] where he spent some time in their long term treatment unit.  He also reported that he going through the stress of separating from his wife.  He now lives at a boarding house and reports that he is afraid he will die if he does not get help with detox treatment.   He denies SI/HI/AVH but reports feeling hopeleless and helpless Patient also sees a Therapist, sports at [place] and reported that he has not been taking his prescribed medications as he should.  We will keep patient overnight and re-evaluate in am while we resume his home medications.  HPI Elements:   Location:  Substance induced mood disorder, Major depression recurrent. Quality:  severe, agitation, irritability, suicidal ideation, . Severity:  severe. Timing:  Acute. Duration:  Chronic mental illness, Substance use disorder, severe and continious. Context:  Seeking detox treatment and voicing suicide.  Past Medical History:  Past Medical History  Diagnosis Date  . Bipolar disorder   . Hepatitis C   . Depression   . Cellulitis     Past Surgical History  Procedure Laterality Date  . Back surgery    . Shoulder surgery     Family History:  Family History  Problem Relation Age of Onset  . Alcohol abuse Father   . Cancer Maternal Aunt    Social History:  History  Alcohol Use  . 0.0 oz/week  . 0 Standard drinks or equivalent per week    Comment: pt states approx a case a day/none since 07/2014 in treatment resides at [facility]  History  Drug Use  . Yes  . Special: Marijuana, Benzodiazepines, Other-see comments, Methamphetamines    Comment: "pills"    History   Social History  . Marital Status: Divorced    Spouse Name: N/A  . Number of Children: N/A  . Years of Education: N/A   Social History Main Topics  . Smoking status: Current Every Day Smoker -- 1.00 packs/day    Types: Cigarettes    Start date:  10/22/1981  . Smokeless tobacco: Never Used  . Alcohol Use: 0.0 oz/week    0 Standard drinks or equivalent per week     Comment: pt states approx a case a day/none since 07/2014 in treatment resides at [facility]  . Drug Use: Yes    Special: Marijuana, Benzodiazepines, Other-see comments, Methamphetamines     Comment: "pills"  . Sexual Activity: Not Currently   Other Topics Concern  . None   Social History Narrative   Additional Social History:    Prescriptions: Wellbutri, Zoloft, Neurotin, Trazodone History of alcohol / drug use?: Yes Withdrawal Symptoms: Cramps, Diarrhea, Tremors, Nausea / Vomiting, Sweats Name of Substance 1: ETOH 1 - Age of First Use: 15  1 - Amount (size/oz): 1 pint of liquor 1 - Frequency: daily 1 - Duration: month 1 - Last Use / Amount: 3am Name of Substance 2: Heroin 2 - Age of First Use: 21 2 - Amount (size/oz): 1/4 gram  2 - Frequency: daily 2 - Duration: month 2 - Last Use / Amount: 6am                 Allergies:   Allergies  Allergen Reactions  . Lithium Anaphylaxis  . Penicillins Rash    Vitals: Blood pressure 148/57, pulse 84, temperature 98.2 F (36.8 C), temperature source Oral, resp. rate 18, SpO2 97 %.  Risk to Self: Suicidal Ideation: No Suicidal Intent: No Is patient at risk for suicide?: No Suicidal Plan?: No Access to Means: No What has been your use of drugs/alcohol within the last 12 months?: listed in notes How many times?: 1 (Patient reported suicidal attempt at 39 y/o.) Other Self Harm Risks: none reported Triggers for Past Attempts: Family contact Intentional Self Injurious Behavior: None Risk to Others: Homicidal Ideation: No Thoughts of Harm to Others: No Current Homicidal Intent: No Current Homicidal Plan: No Access to Homicidal Means: No Identified Victim: NA History of harm to others?: No Assessment of Violence: None Noted Violent Behavior Description: Patient calm and cooperative at assessment. Does  patient have access to weapons?: No Criminal Charges Pending?: No Does patient have a court date: No Prior Inpatient Therapy: Prior Inpatient Therapy: Yes Prior Therapy Dates: multiple- most recent 08/2014 Prior Therapy Facilty/Provider(s): [facility], [name],  Reason for Treatment: SA Prior Outpatient Therapy: Prior Outpatient Therapy: Yes Prior Therapy Dates: current- last 2 years Prior Therapy Facilty/Provider(s): [facility] Reason for Treatment: medication management   Current Facility-Administered Medications  Medication Dose Route Frequency Provider Last Rate Last Dose  . acetaminophen (TYLENOL) tablet 650 mg  650 mg Oral Q4H PRN Flint Melter, MD      . alum & mag hydroxide-simeth (MAALOX/MYLANTA) 200-200-20 MG/5ML suspension 30 mL  30 mL Oral PRN Flint Melter, MD      . buPROPion (WELLBUTRIN XL) 24 hr tablet 300 mg  300 mg Oral Daily Flint Melter, MD   300 mg at 12/26/14 1005  . chlordiazePOXIDE (LIBRIUM) capsule 25 mg  25 mg Oral Q6H PRN Flint Melter, MD      .  chlordiazePOXIDE (LIBRIUM) capsule 25 mg  25 mg Oral QID Flint Melter, MD   25 mg at 12/26/14 0902   Followed by  . [START ON 12/27/2014] chlordiazePOXIDE (LIBRIUM) capsule 25 mg  25 mg Oral TID Flint Melter, MD       Followed by  . [START ON 12/28/2014] chlordiazePOXIDE (LIBRIUM) capsule 25 mg  25 mg Oral BH-qamhs Flint Melter, MD       Followed by  . [START ON 12/29/2014] chlordiazePOXIDE (LIBRIUM) capsule 25 mg  25 mg Oral Daily Flint Melter, MD      . furosemide (LASIX) tablet 20 mg  20 mg Oral Daily Flint Melter, MD   20 mg at 12/25/14 2150  . gabapentin (NEURONTIN) capsule 800 mg  800 mg Oral QID Flint Melter, MD   800 mg at 12/26/14 0902  . hydrOXYzine (ATARAX/VISTARIL) tablet 25 mg  25 mg Oral Q6H PRN Flint Melter, MD      . hydrOXYzine (ATARAX/VISTARIL) tablet 25 mg  25 mg Oral Q6H PRN Flint Melter, MD      . ibuprofen (ADVIL,MOTRIN) tablet 600 mg  600 mg Oral Q8H PRN Flint Melter, MD       . loperamide (IMODIUM) capsule 2-4 mg  2-4 mg Oral PRN Flint Melter, MD      . multivitamin with minerals tablet 1 tablet  1 tablet Oral Daily Flint Melter, MD   1 tablet at 12/26/14 [phone]  . nicotine (NICODERM CQ - dosed in mg/24 hours) patch 21 mg  21 mg Transdermal Daily PRN Flint Melter, MD   21 mg at 12/26/14 1005  . ondansetron (ZOFRAN) tablet 4 mg  4 mg Oral Q8H PRN Flint Melter, MD      . ondansetron (ZOFRAN-ODT) disintegrating tablet 4 mg  4 mg Oral Q6H PRN Flint Melter, MD   4 mg at 12/25/14 2050  . sertraline (ZOLOFT) tablet 100 mg  100 mg Oral Daily Flint Melter, MD   100 mg at 12/26/14 0903  . thiamine (VITAMIN B-1) tablet 100 mg  100 mg Oral Daily Flint Melter, MD   100 mg at 12/26/14 0902  . traZODone (DESYREL) tablet 100 mg  100 mg Oral QHS Flint Melter, MD   100 mg at 12/25/14 2145  . zolpidem (AMBIEN) tablet 5 mg  5 mg Oral QHS PRN Flint Melter, MD       Current Outpatient Prescriptions  Medication Sig Dispense Refill  . buPROPion (WELLBUTRIN SR) 150 MG 12 hr tablet Take 450 mg by mouth daily.    Marland Kitchen gabapentin (NEURONTIN) 400 MG capsule Take 2 capsules (800 mg total) by mouth 4 (four) times daily. 240 capsule 0  . methocarbamol (ROBAXIN) 500 MG tablet Take 1 tablet (500 mg total) by mouth every 8 (eight) hours as needed for muscle spasms. 20 tablet 0  . sertraline (ZOLOFT) 100 MG tablet Take 1 tablet (100 mg total) by mouth daily. 30 tablet 0  . traZODone (DESYREL) 100 MG tablet Take 100 mg by mouth at bedtime.    Marland Kitchen buPROPion (WELLBUTRIN XL) 300 MG 24 hr tablet Take 1 tablet (300 mg total) by mouth daily. (Patient not taking: Reported on 12/25/2014) 30 tablet 0  . fluticasone (FLONASE) 50 MCG/ACT nasal spray Place 2 sprays into both nostrils daily. (Patient not taking: Reported on 12/25/2014) 16 g 6  . furosemide (LASIX) 20 MG tablet Take 1 tablet (20 mg total)  by mouth daily. (Patient not taking: Reported on 11/09/2014) 5 tablet 0  . hydrOXYzine  (ATARAX/VISTARIL) 25 MG tablet Take 1 tablet (25 mg total) by mouth every 6 (six) hours as needed for anxiety. (Patient not taking: Reported on 12/25/2014) 30 tablet 0  . ibuprofen (ADVIL,MOTRIN) 800 MG tablet Take 1 tablet (800 mg total) by mouth every 8 (eight) hours as needed for mild pain or moderate pain. (Patient not taking: Reported on 12/25/2014) 30 tablet 0  . Multiple Vitamin (MULTIVITAMIN WITH MINERALS) TABS tablet Take 1 tablet by mouth daily.    . pantoprazole (PROTONIX) 40 MG tablet Take 1 tablet (40 mg total) by mouth daily at 6 (six) AM. (Patient not taking: Reported on 12/25/2014) 30 tablet 0    Musculoskeletal: Strength & Muscle Tone: within normal limits Gait & Station: normal Patient leans: N/A  Psychiatric Specialty Exam:     Blood pressure 148/57, pulse 84, temperature 98.2 F (36.8 C), temperature source Oral, resp. rate 18, SpO2 97 %.There is no weight on file to calculate BMI.  General Appearance: Casual and Disheveled  Eye Contact::  Fair  Speech:  Clear and Coherent and Normal Rate  Volume:  Normal  Mood:  Anxious, Depressed and Irritable  Affect:  Congruent, Depressed and Flat  Thought Process:  Coherent, Goal Directed and Intact  Orientation:  Full (Time, Place, and Person)  Thought Content:  WDL  Suicidal Thoughts:  No  Homicidal Thoughts:  No  Memory:  Immediate;   Good Recent;   Good Remote;   Good  Judgement:  Poor  Insight:  Shallow  Psychomotor Activity:  Normal  Concentration:  Fair  Recall:  NA  Fund of Knowledge:Fair  Language: Good  Akathisia:  NA  Handed:  Right  AIMS (if indicated):     Assets:  Desire for Improvement  ADL's:  Intact  Cognition: WNL  Sleep:      Medical Decision Making: Established Problem, Worsening (2), Review of Medication Regimen & Side Effects (2) and Review of New Medication or Change in Dosage (2)  Treatment Plan Summary: Observe and reevaluate in am  Plan:  Re-evaluate in am Disposition: See  above  Dahlia ByesONUOHA, JOSEPHINE, C   PMHNP-BC 12/26/2014 11:23 AM   I have personally seen the patient and agreed with the findings and involved in the treatment plan. Kathryne SharperSyed Arfeen, MD

## 2014-12-27 DIAGNOSIS — R45851 Suicidal ideations: Secondary | ICD-10-CM | POA: Diagnosis not present

## 2014-12-27 DIAGNOSIS — Y909 Presence of alcohol in blood, level not specified: Secondary | ICD-10-CM | POA: Diagnosis not present

## 2014-12-27 DIAGNOSIS — F1024 Alcohol dependence with alcohol-induced mood disorder: Secondary | ICD-10-CM | POA: Diagnosis not present

## 2014-12-27 NOTE — Discharge Instructions (Signed)
To help you maintain a sober lifestyle, a substance abuse treatment program may be beneficial to you.  You are scheduled to start the Chemical Dependency Intensive Outpatient Program (CD[facility]IOP) at [facility] this week.  You are scheduled for orientation tomorrow, Tuesday, 12/28/2014 at 10:00 am.  You will start the program the following day, Wednesday 12/29/2014 at 1:00 pm.  The program meets Monday, Wednesday, and Friday from 1:00 pm - 4:00 pm.  If you have any questions, contact Charmian MuffAnn Evans, LCAS at the number below:       [facility]      [address]      [place], [place] [address]      [phone]      Contact person: Charmian MuffAnn Evans, LCAS

## 2014-12-27 NOTE — Consult Note (Signed)
[facility] Face-to-Face Psychiatry Consult   Reason for Consult:  Substance induced mood disorder Referring Physician:  EDP Patient Identification: [name] [name] MRN:  [id number] Principal Diagnosis: Alcohol dependence with alcohol-induced mood disorder Diagnosis:   Patient Active Problem List   Diagnosis Date Noted  . Suicidal ideation [R45.851]   . Recurrent cellulitis of lower leg [L03.119] 11/09/2014  . Venous (peripheral) insufficiency [I87.2] 11/09/2014  . Bipolar affective disorder [F31.9]   . Alcohol dependence with alcohol-induced mood disorder [F10.24]   . Opioid dependence with opioid-induced mood disorder [F11.24]   . MDD (major depressive disorder), recurrent severe, without psychosis [F33.2] 09/09/2014  . MDD (major depressive disorder) [F32.2] 09/09/2014  . Suicidal ideations [R45.851]   . Chronic pain syndrome [G89.4] 05/31/2014  . Tobacco dependence [F17.200] 05/30/2014  . Poor dentition [K08.8] 02/03/2014  . Hepatitis C virus infection without hepatic coma [B19.20] 02/03/2014  . Sepsis [A41.9] 01/29/2014  . Anxiety state, unspecified [F41.1] 01/31/2013  . Mood disorder in conditions classified elsewhere [F06.30] 01/31/2013  . Polysubstance (including opioids) dependence with physiological dependence [F19.20] 01/27/2013    Class: Acute  . Alcohol withdrawal [F10.239] 01/27/2013    Class: Acute    Total Time spent with patient: 20 minutes  Subjective:   [name] [name] is a 39 y.o. male patient admitted with Substance induced mood disorder.  HPI: Caucasian male, 39 years old was evaluated this morning by Dr. Jannifer FranklinAkintayo and Drenda FreezeFran, NP. He states he came into the ED on Saturday for detox from heroin and ETOH. He reports using heroin 1 gram per day for a month and drink a 12 pack or a pint per day for a month. Last use of both substances was 2 days ago. He states his stressors include marital problems and issues with his kids. He states he ready to go home. He was planning to OD on  Heroin yesterday but denies that today.  Patient reported that he relapsed after leaving [name] where he spent some time in their long term treatment unit.  He also reported that he going through the stress of separating from his wife.  Today he denies suicidal or homicidal ideation, intent or plan. He denies AVH. He denies withdrawal symptoms.   HPI Elements:  Location:  Substance induced mood disorder, Major depression recurrent. Quality:  severe, agitation, irritability Severity:  severe. Timing:  Acute. Duration:  Chronic mental illness, Substance use disorder, severe and continious. Context:  Stressors, separation from wife  Past Medical History:  Past Medical History  Diagnosis Date  . Bipolar disorder   . Hepatitis C   . Depression   . Cellulitis     Past Surgical History  Procedure Laterality Date  . Back surgery    . Shoulder surgery     Family History:  Family History  Problem Relation Age of Onset  . Alcohol abuse Father   . Cancer Maternal Aunt    Social History:  History  Alcohol Use  . 0.0 oz/week  . 0 Standard drinks or equivalent per week    Comment: pt states approx a case a day/none since 07/2014 in treatment [facility]     History  Drug Use  . Yes  . Special: Marijuana, Benzodiazepines, Other-see comments, Methamphetamines    Comment: "pills"    History   Social History  . Marital Status: Divorced    Spouse Name: N/A  . Number of Children: N/A  . Years of Education: N/A   Social History Main Topics  .  Smoking status: Current Every Day Smoker -- 1.00 packs/day    Types: Cigarettes    Start date: 10/22/1981  . Smokeless tobacco: Never Used  . Alcohol Use: 0.0 oz/week    0 Standard drinks or equivalent per week     Comment: pt states approx a case a day/none since 07/2014 in treatment resides at [facility]  . Drug Use: Yes    Special: Marijuana, Benzodiazepines, Other-see comments, Methamphetamines     Comment: "pills"  . Sexual  Activity: Not Currently   Other Topics Concern  . None   Social History Narrative   Additional Social History:    Prescriptions: Wellbutri, Zoloft, Neurotin, Trazodone History of alcohol / drug use?: Yes Withdrawal Symptoms: Cramps, Diarrhea, Tremors, Nausea / Vomiting, Sweats Name of Substance 1: ETOH 1 - Age of First Use: 15  1 - Amount (size/oz): 1 pint of liquor 1 - Frequency: daily 1 - Duration: month 1 - Last Use / Amount: 3am Name of Substance 2: Heroin 2 - Age of First Use: 21 2 - Amount (size/oz): 1/4 gram  2 - Frequency: daily 2 - Duration: month 2 - Last Use / Amount: 6am  Allergies:   Allergies  Allergen Reactions  . Lithium Anaphylaxis  . Penicillins Rash    Vitals: Blood pressure 143/79, pulse 83, temperature 97.8 F (36.6 C), temperature source Oral, resp. rate 18, SpO2 98 %.  Risk to Self: Suicidal Ideation: No Suicidal Intent: No Is patient at risk for suicide?: No Suicidal Plan?: No Access to Means: No What has been your use of drugs/alcohol within the last 12 months?: listed in notes How many times?: 1 (Patient reported suicidal attempt at 39 y/o.) Other Self Harm Risks: none reported Triggers for Past Attempts: Family contact Intentional Self Injurious Behavior: None Risk to Others: Homicidal Ideation: No Thoughts of Harm to Others: No Current Homicidal Intent: No Current Homicidal Plan: No Access to Homicidal Means: No Identified Victim: NA History of harm to others?: No Assessment of Violence: None Noted Violent Behavior Description: Patient calm and cooperative at assessment. Does patient have access to weapons?: No Criminal Charges Pending?: No Does patient have a court date: No Prior Inpatient Therapy: Prior Inpatient Therapy: Yes Prior Therapy Dates: multiple- most recent 08/2014 Prior Therapy Facilty/Provider(s): [facility], [name],  Reason for Treatment: SA Prior Outpatient Therapy: Prior Outpatient Therapy: Yes Prior Therapy Dates:  current- last 2 years Prior Therapy Facilty/Provider(s): [facility] Reason for Treatment: medication management   No current facility-administered medications for this encounter.   Current Outpatient Prescriptions  Medication Sig Dispense Refill  . buPROPion (WELLBUTRIN SR) 150 MG 12 hr tablet Take 450 mg by mouth daily.    Marland Kitchen gabapentin (NEURONTIN) 400 MG capsule Take 2 capsules (800 mg total) by mouth 4 (four) times daily. 240 capsule 0  . sertraline (ZOLOFT) 100 MG tablet Take 1 tablet (100 mg total) by mouth daily. 30 tablet 0  . traZODone (DESYREL) 100 MG tablet Take 100 mg by mouth at bedtime.    . hydrOXYzine (ATARAX/VISTARIL) 25 MG tablet Take 1 tablet (25 mg total) by mouth every 6 (six) hours as needed for anxiety. (Patient not taking: Reported on 12/25/2014) 30 tablet 0  . Multiple Vitamin (MULTIVITAMIN WITH MINERALS) TABS tablet Take 1 tablet by mouth daily.    . pantoprazole (PROTONIX) 40 MG tablet Take 1 tablet (40 mg total) by mouth daily at 6 (six) AM. (Patient not taking: Reported on 12/25/2014) 30 tablet 0    Musculoskeletal: Strength & Muscle Tone:  within normal limits Gait & Station: normal Patient leans: N/A  Psychiatric Specialty Exam:     Blood pressure 143/79, pulse 83, temperature 97.8 F (36.6 C), temperature source Oral, resp. rate 18, SpO2 98 %.There is no weight on file to calculate BMI.  General Appearance: Casual and Disheveled  Eye Contact::  Fair  Speech:  Clear and Coherent and Normal Rate  Volume:  Normal  Mood:  Anxious, Depressed and Irritable  Affect:  Congruent, Depressed and Flat  Thought Process:  Coherent, Goal Directed and Intact  Orientation:  Full (Time, Place, and Person)  Thought Content:  WDL  Suicidal Thoughts:  No  Homicidal Thoughts:  No  Memory:  Immediate;   Good Recent;   Good Remote;   Good  Judgement:  Poor  Insight:  Intact  Psychomotor Activity:  Normal  Concentration:  Fair  Recall:  NA  Fund of Knowledge:Fair   Language: Good  Akathisia:  NA  Handed:  Right  AIMS (if indicated):     Assets:  Desire for Improvement  ADL's:  Intact  Cognition: WNL  Sleep:      Medical Decision Making: Established Problem, Worsening (2), Review of Medication Regimen & Side Effects (2) and Review of New Medication or Change in Dosage (2)   Disposition: Case discussed with Dr. Jannifer Franklin who recommends patient be discharged home with outpatient follow up at [facility]. Patient will begin orientation at CD[facility]IOP at [facility] on 12/28/2014 at 10:00, with plan to start the program the following day, 12/29/2014 at 13:00.  Alberteen Sam, FNP-BC [facility] 12/27/2014 2:09 PM   Patient seen face-to-face for psychiatric evaluation, chart reviewed and case discussed with the physician extender and developed treatment plan. Reviewed the information documented and agree with the treatment plan. Thedore Mins, MD

## 2014-12-27 NOTE — [facility] Suicide Risk Assessment (Signed)
Suicide Risk Assessment  Discharge Assessment   [facility]   Demographic Factors:  Male, Caucasian and Low socioeconomic status  Total Time spent with patient: 20 minutes  Musculoskeletal: Strength & Muscle Tone: within normal limits Gait & Station: normal Patient leans: N/A  Psychiatric Specialty Exam: Psychiatric Specialty Exam:     Blood pressure 143/79, pulse 83, temperature 97.8 F (36.6 C), temperature source Oral, resp. rate 18, SpO2 98 %.There is no weight on file to calculate BMI.  General Appearance: Casual and Disheveled  Eye Contact::  Fair  Speech:  Clear and Coherent and Normal Rate  Volume:  Normal  Mood:  Anxious, Depressed and Irritable  Affect:  Congruent, Depressed and Flat  Thought Process:  Coherent, Goal Directed and Intact  Orientation:  Full (Time, Place, and Person)  Thought Content:  WDL  Suicidal Thoughts:  No  Homicidal Thoughts:  No  Memory:  Immediate;   Good Recent;   Good Remote;   Good  Judgement:  Poor  Insight:  Intact  Psychomotor Activity:  Normal  Concentration:  Fair  Recall:  NA  Fund of Knowledge:Fair  Language: Good  Akathisia:  NA  Handed:  Right  AIMS (if indicated):     Assets:  Desire for Improvement  ADL's:  Intact  Cognition: WNL  Sleep:         Has this patient used any form of tobacco in the last 30 days? (Cigarettes, Smokeless Tobacco, Cigars, and/or Pipes) Yes, Prescription not provided because: declined by patient  Mental Status Per Nursing Assessment::   On Admission:     Current Mental Status by Physician: NA  Loss Factors: Separation from spouse  Historical Factors: NA  Risk Reduction Factors:   Responsible for children under 37 years of age and Sense of responsibility to family  Continued Clinical Symptoms:  Alcohol/Substance Abuse/Dependencies  Cognitive Features That Contribute To Risk:  Polarized thinking    Suicide Risk:  Minimal: No identifiable suicidal  ideation.  Patients presenting with no risk factors but with morbid ruminations; may be classified as minimal risk based on the severity of the depressive symptoms  Principal Problem: Alcohol dependence with alcohol-induced mood disorder Discharge Diagnoses:  Patient Active Problem List   Diagnosis Date Noted  . Suicidal ideation [R45.851]   . Recurrent cellulitis of lower leg [L03.119] 11/09/2014  . Venous (peripheral) insufficiency [I87.2] 11/09/2014  . Bipolar affective disorder [F31.9]   . Alcohol dependence with alcohol-induced mood disorder [F10.24]   . Opioid dependence with opioid-induced mood disorder [F11.24]   . MDD (major depressive disorder), recurrent severe, without psychosis [F33.2] 09/09/2014  . MDD (major depressive disorder) [F32.2] 09/09/2014  . Suicidal ideations [R45.851]   . Chronic pain syndrome [G89.4] 05/31/2014  . Tobacco dependence [F17.200] 05/30/2014  . Poor dentition [K08.8] 02/03/2014  . Hepatitis C virus infection without hepatic coma [B19.20] 02/03/2014  . Sepsis [A41.9] 01/29/2014  . Anxiety state, unspecified [F41.1] 01/31/2013  . Mood disorder in conditions classified elsewhere [F06.30] 01/31/2013  . Polysubstance (including opioids) dependence with physiological dependence [F19.20] 01/27/2013    Class: Acute  . Alcohol withdrawal [F10.239] 01/27/2013    Class: Acute      Plan Of Care/Follow-up recommendations:  Activity:  As tolerated  Diet:  Regular  Is patient on multiple antipsychotic therapies at discharge:  No   Has Patient had three or more failed trials of antipsychotic monotherapy by history:  No  Recommended Plan for Multiple Antipsychotic Therapies: NA  Alberteen SamFran Syrah Daughtrey,  FNP-BC [facility] 12/27/2014, 2:27 PM

## 2014-12-27 NOTE — BH Assessment (Signed)
[facility] Assessment Progress Note  Per Thedore MinsMojeed Akintayo, MD this pt does not present a life threatening danger to himself or others, and psychiatric hospitalization is not indicated for him at this time.  [facility].  This Clinical research associatewriter spoke to the pt and found that [facility]  I then called Charmian MuffAnn Evans, LCAS and scheduled the pt for orientation tomorrow, 12/28/2014 at 10:00, with plan to start the program the following day, 12/29/2014 at 13:00.  This information has been included in pt's discharge instructions.  Pt has signed Cons[facility] Release Information [facility], and a notification call has been placed.  Pt's nurse, Minerva AreolaEric, has been informed.  Doylene Canninghomas [place] Stell, MA Triage Specialist 12/27/2014 @ 10:31

## 2015-02-02 ENCOUNTER — Emergency Department (HOSPITAL_COMMUNITY)
Admission: EM | Admit: 2015-02-02 | Discharge: 2015-02-02 | Disposition: A | Discharge status: Home / Self Care | Payer: Medicare Other | Attending: Emergency Medicine | Admitting: Emergency Medicine

## 2015-02-02 ENCOUNTER — Encounter (HOSPITAL_COMMUNITY): Payer: Self-pay | Admitting: Emergency Medicine

## 2015-02-02 DIAGNOSIS — Z8619 Personal history of other infectious and parasitic diseases: Secondary | ICD-10-CM | POA: Diagnosis not present

## 2015-02-02 DIAGNOSIS — Z79899 Other long term (current) drug therapy: Secondary | ICD-10-CM | POA: Diagnosis not present

## 2015-02-02 DIAGNOSIS — Z880 Allergy status to penicillin: Secondary | ICD-10-CM | POA: Diagnosis not present

## 2015-02-02 DIAGNOSIS — Z720 Tobacco use: Secondary | ICD-10-CM | POA: Diagnosis not present

## 2015-02-02 DIAGNOSIS — L03211 Cellulitis of face: Secondary | ICD-10-CM | POA: Insufficient documentation

## 2015-02-02 DIAGNOSIS — R112 Nausea with vomiting, unspecified: Secondary | ICD-10-CM | POA: Diagnosis present

## 2015-02-02 DIAGNOSIS — F319 Bipolar disorder, unspecified: Secondary | ICD-10-CM | POA: Diagnosis not present

## 2015-02-02 LAB — COMPREHENSIVE METABOLIC PANEL
ALT: 292 U/L — ABNORMAL HIGH (ref 0–53)
ANION GAP: 8 (ref 5–15)
AST: 166 U/L — ABNORMAL HIGH (ref 0–37)
Albumin: 4.4 g/dL (ref 3.5–5.2)
Alkaline Phosphatase: 85 U/L (ref 39–117)
BILIRUBIN TOTAL: 0.9 mg/dL (ref 0.3–1.2)
BUN: 17 mg/dL (ref 6–23)
CO2: 25 mmol/L (ref 19–32)
CREATININE: 0.7 mg/dL (ref 0.50–1.35)
Calcium: 9 mg/dL (ref 8.4–10.5)
Chloride: 106 mmol/L (ref 96–112)
GFR calc non Af Amer: >90 mL/min (ref >90)
Glucose, Bld: 107 mg/dL — ABNORMAL HIGH (ref 70–99)
Potassium: 4.5 mmol/L (ref 3.5–5.1)
SODIUM: 139 mmol/L (ref 135–145)
TOTAL PROTEIN: 8.3 g/dL (ref 6.0–8.3)

## 2015-02-02 LAB — CBC WITH DIFFERENTIAL/PLATELET
Basophils Absolute: 0 10*3/uL (ref 0.0–0.1)
Basophils Relative: 0 % (ref 0–1)
EOS ABS: 0.3 10*3/uL (ref 0.0–0.7)
EOS PCT: 2 % (ref 0–5)
HEMATOCRIT: 50.4 % (ref 39.0–52.0)
Hemoglobin: 16.9 g/dL (ref 13.0–17.0)
Lymphocytes Relative: 11 % — ABNORMAL LOW (ref 12–46)
Lymphs Abs: 1.5 10*3/uL (ref 0.7–4.0)
MCH: 32.3 pg (ref 26.0–34.0)
MCHC: 33.5 g/dL (ref 30.0–36.0)
MCV: 96.2 fL (ref 78.0–100.0)
MONOS PCT: 10 % (ref 3–12)
Monocytes Absolute: 1.4 10*3/uL — ABNORMAL HIGH (ref 0.1–1.0)
Neutro Abs: 10.2 10*3/uL — ABNORMAL HIGH (ref 1.7–7.7)
Neutrophils Relative %: 77 % (ref 43–77)
Platelets: 258 10*3/uL (ref 150–400)
RBC: 5.24 MIL/uL (ref 4.22–5.81)
RDW: 13.9 % (ref 11.5–15.5)
WBC: 13.3 10*3/uL — ABNORMAL HIGH (ref 4.0–10.5)

## 2015-02-02 LAB — LIPASE, BLOOD: LIPASE: 21 U/L (ref 11–59)

## 2015-02-02 MED ORDER — ONDANSETRON 4 MG PO TBDP
4.0000 mg | ORAL_TABLET | Freq: Once | ORAL | Status: AC
  Administered 2015-02-02: 4 mg via ORAL
  Filled 2015-02-02: qty 1

## 2015-02-02 MED ORDER — ONDANSETRON 4 MG PO TBDP: ORAL_TABLET | ORAL | Status: DC

## 2015-02-02 MED ORDER — DOXYCYCLINE HYCLATE 100 MG PO CAPS: 100.0000 mg | ORAL_CAPSULE | Freq: Two times a day (BID) | ORAL | Status: DC

## 2015-02-02 NOTE — ED Provider Notes (Signed)
CSN: [id number]     Arrival date & time 02/02/15  1449 History   First MD Initiated Contact with Patient 02/02/15 1631     Chief Complaint  Patient presents with  . Emesis  . Facial Swelling     (Consider location/radiation/quality/duration/timing/severity/associated sxs/prior Treatment) Patient is a 39 y.o. male presenting with vomiting. The history is provided by the patient.  Emesis Severity:  Mild Timing:  Constant Quality:  Stomach contents Able to tolerate:  Liquids Progression:  Improving Chronicity:  New Recent urination:  Normal Relieved by:  Nothing Worsened by:  Nothing tried Ineffective treatments:  None tried Associated symptoms: no abdominal pain, no diarrhea and no headaches     Past Medical History  Diagnosis Date  . Bipolar disorder   . Hepatitis C   . Depression   . Cellulitis    Past Surgical History  Procedure Laterality Date  . Back surgery    . Shoulder surgery     Family History  Problem Relation Age of Onset  . Alcohol abuse Father   . Cancer Maternal Aunt    History  Substance Use Topics  . Smoking status: Current Every Day Smoker -- 1.00 packs/day    Types: Cigarettes    Start date: 10/22/1981  . Smokeless tobacco: Never Used  . Alcohol Use: 0.0 oz/week    0 Standard drinks or equivalent per week     Comment: pt states approx a case a day/none since 07/2014 in treatment resides at [facility]    Review of Systems  Constitutional: Negative for fever.  HENT: Negative for drooling and rhinorrhea.   Eyes: Negative for pain.  Respiratory: Negative for cough and shortness of breath.   Cardiovascular: Negative for chest pain and leg swelling.  Gastrointestinal: Positive for nausea and vomiting. Negative for abdominal pain and diarrhea.  Genitourinary: Negative for dysuria and hematuria.  Musculoskeletal: Negative for gait problem and neck pain.  Skin: Negative for color change.       cellulitis  Neurological: Negative for numbness and  headaches.  Hematological: Negative for adenopathy.  Psychiatric/Behavioral: Negative for behavioral problems.  All other systems reviewed and are negative.     Allergies  Lithium and Penicillins  Home Medications   Prior to Admission medications   Medication Sig Start Date End Date Taking? Authorizing Provider  buprenorphine-naloxone (SUBOXONE) 8-2 MG SUBL SL tablet Place 1 tablet under the tongue 2 (two) times daily.   Yes Historical Provider, MD  buPROPion (WELLBUTRIN SR) 150 MG 12 hr tablet Take 450 mg by mouth daily.   Yes Historical Provider, MD  gabapentin (NEURONTIN) 400 MG capsule Take 2 capsules (800 mg total) by mouth 4 (four) times daily. 09/13/14  Yes Adonis Brook, NP  methocarbamol (ROBAXIN) 500 MG tablet Take 500 mg by mouth 3 (three) times daily as needed for muscle spasms (muscle spasms).   Yes Historical Provider, MD  sertraline (ZOLOFT) 100 MG tablet Take 1 tablet (100 mg total) by mouth daily. 09/14/14  Yes Adonis Brook, NP  traZODone (DESYREL) 100 MG tablet Take 100 mg by mouth at bedtime as needed for sleep (sleep).    Yes Historical Provider, MD  hydrOXYzine (ATARAX/VISTARIL) 25 MG tablet Take 1 tablet (25 mg total) by mouth every 6 (six) hours as needed for anxiety. Patient not taking: Reported on 12/25/2014 09/13/14   Adonis Brook, NP  pantoprazole (PROTONIX) 40 MG tablet Take 1 tablet (40 mg total) by mouth daily at 6 (six) AM. Patient not taking: Reported on 12/25/2014  10/23/14   Rodolph Bong, MD   BP 138/84 mmHg  Pulse 88  Temp(Src) 97.4 F (36.3 C) (Oral)  Resp 18  SpO2 98% Physical Exam  Constitutional: He is oriented to person, place, and time. He appears well-developed and well-nourished.  HENT:  Head: Normocephalic and atraumatic.  Right Ear: External ear normal.  Left Ear: External ear normal.  Nose: Nose normal.  Mouth/Throat: Oropharynx is clear and moist. No oropharyngeal exudate.  3 x 3 cm erythematous area in the left temporal  region. Central excoriation noted. Mild induration but no evidence of fluctuance or underlying purulence. No purulent drainage with compression.  Eyes: Conjunctivae and EOM are normal. Pupils are equal, round, and reactive to light.  Neck: Normal range of motion. Neck supple.  Cardiovascular: Normal rate, regular rhythm, normal heart sounds and intact distal pulses.  Exam reveals no gallop and no friction rub.   No murmur heard. Pulmonary/Chest: Effort normal and breath sounds normal. No respiratory distress. He has no wheezes.  Abdominal: Soft. Bowel sounds are normal. He exhibits no distension. There is no tenderness. There is no rebound and no guarding.  Musculoskeletal: Normal range of motion. He exhibits no edema or tenderness.  Neurological: He is alert and oriented to person, place, and time.  Skin: Skin is warm and dry.  Psychiatric: He has a normal mood and affect. His behavior is normal.  Nursing note and vitals reviewed.   ED Course  Procedures (including critical care time) Labs Review Labs Reviewed  CBC WITH DIFFERENTIAL/PLATELET - Abnormal; Notable for the following:    WBC 13.3 (*)    Neutro Abs 10.2 (*)    Lymphocytes Relative 11 (*)    Monocytes Absolute 1.4 (*)    All other components within normal limits  COMPREHENSIVE METABOLIC PANEL - Abnormal; Notable for the following:    Glucose, Bld 107 (*)    AST 166 (*)    ALT 292 (*)    All other components within normal limits  LIPASE, BLOOD    Imaging Review No results found.   EKG Interpretation None      MDM   Final diagnoses:  Nausea and vomiting, vomiting of unspecified type  Cellulitis, face    5:07 PM 39 y.o. male w hx of etoh abuse (pt denies etoh use in last few weeks), bipolar, hep C who presents with nausea and vomiting which began around lunchtime today. He denies any blood in his emesis. He denies any fevers or abdominal pain. He has some mild erythema in the central excoriation to the left  temporal area. He has had this for the last 2-3 days. He is afebrile and vital signs are unremarkable here. I offered IV fluids but he declined. Will provide prescription for Zofran and oral antibiotics. No evidence of purulent drainage and he notes no drainage at home. We'll recommend warm compresses and antibiotics and return for any worsening. Lab work which was ordered prior to my evaluation was reviewed by me. Mild leukocytosis and mildly elevated liver enzymes are noted. Patient does have a history of EtOH abuse in the past as well as hep C.  5:18 PM: Doxy w/ minimal liver metabolization. I have discussed the diagnosis/risks/treatment options with the patient and believe the pt to be eligible for discharge home to follow-up with his pcp. We also discussed returning to the ED immediately if new or worsening sx occur. We discussed the sx which are most concerning (e.g., spreading redness, fever, worsening pain) that necessitate  immediate return. Medications administered to the patient during their visit and any new prescriptions provided to the patient are listed below.  Medications given during this visit Medications  ondansetron (ZOFRAN-ODT) disintegrating tablet 4 mg (not administered)  ondansetron (ZOFRAN-ODT) disintegrating tablet 4 mg (4 mg Oral Given 02/02/15 1523)    New Prescriptions   DOXYCYCLINE (VIBRAMYCIN) 100 MG CAPSULE    Take 1 capsule (100 mg total) by mouth 2 (two) times daily. One po bid x 7 days   ONDANSETRON (ZOFRAN ODT) 4 MG DISINTEGRATING TABLET    4mg  ODT q4 hours prn nausea/vomit     Purvis SheffieldForrest Ransome Helwig, MD 02/02/15 1718

## 2015-02-02 NOTE — ED Notes (Signed)
Pt c/o emesis, and swollen, painful, reddened mass on upper left face. Denies diarrhea.

## 2015-02-11 IMAGING — DX DG ANKLE 2V *L*
2 series · 2 of 2 positions shown · non-contrast
Comparison: None.

CLINICAL DATA: Follow-up cellulitis

EXAM:
LEFT ANKLE - 2 VIEW

[ankle ap]
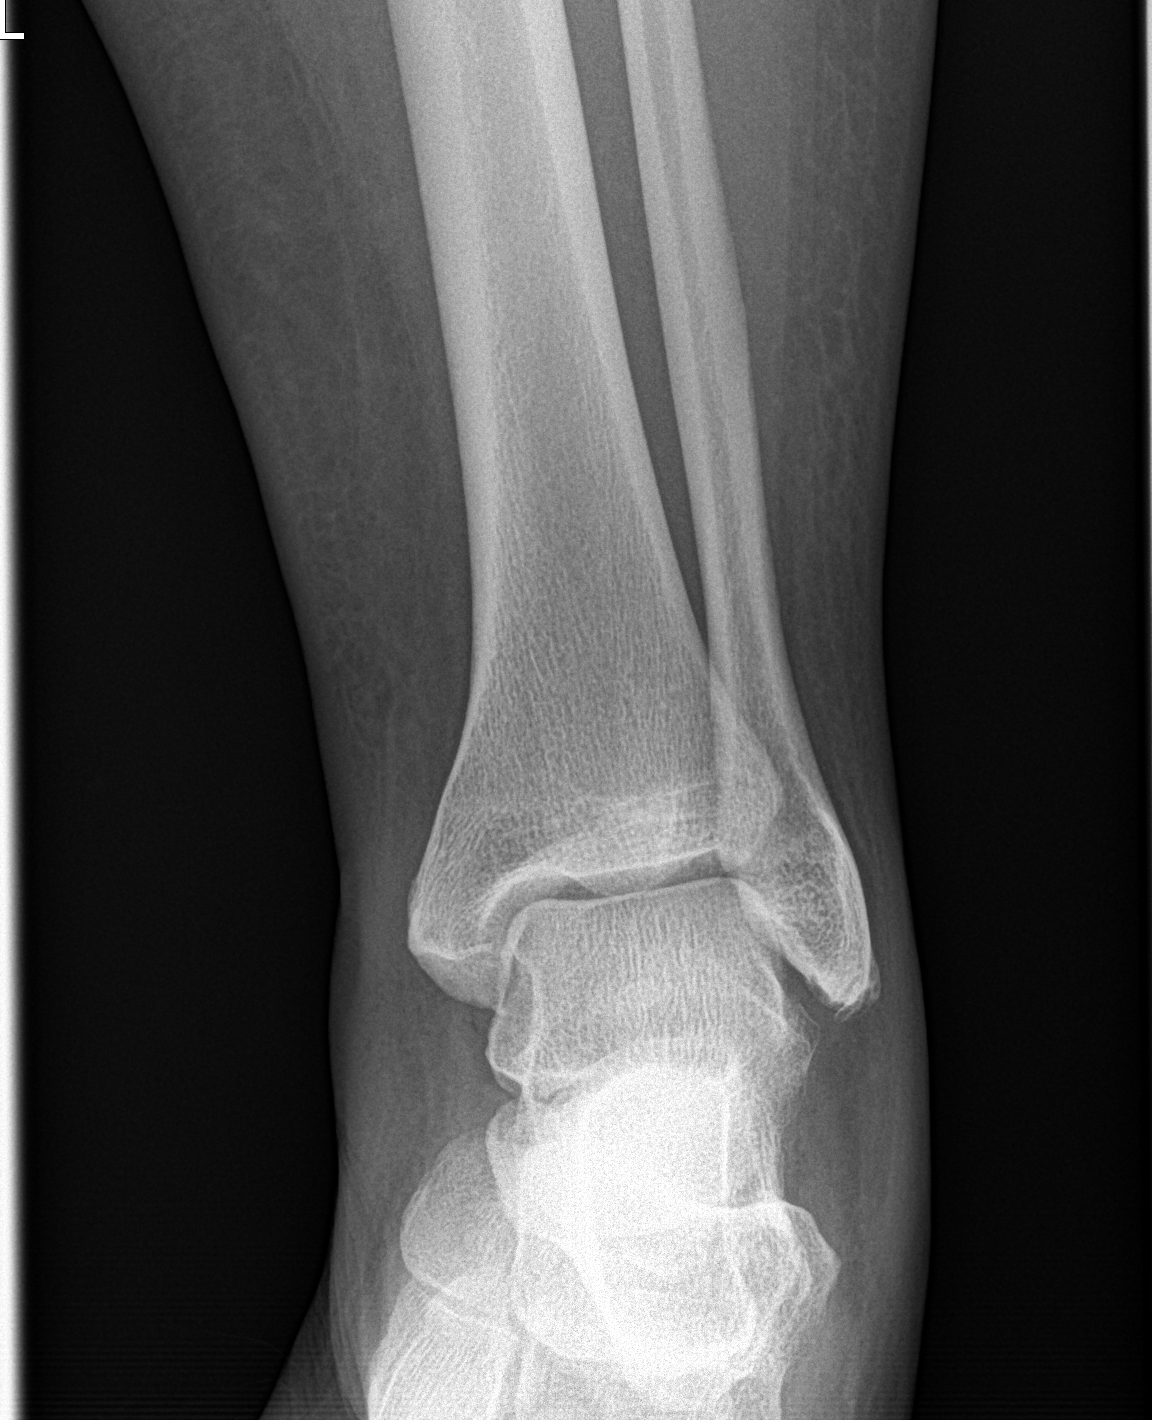

[ankle lat]
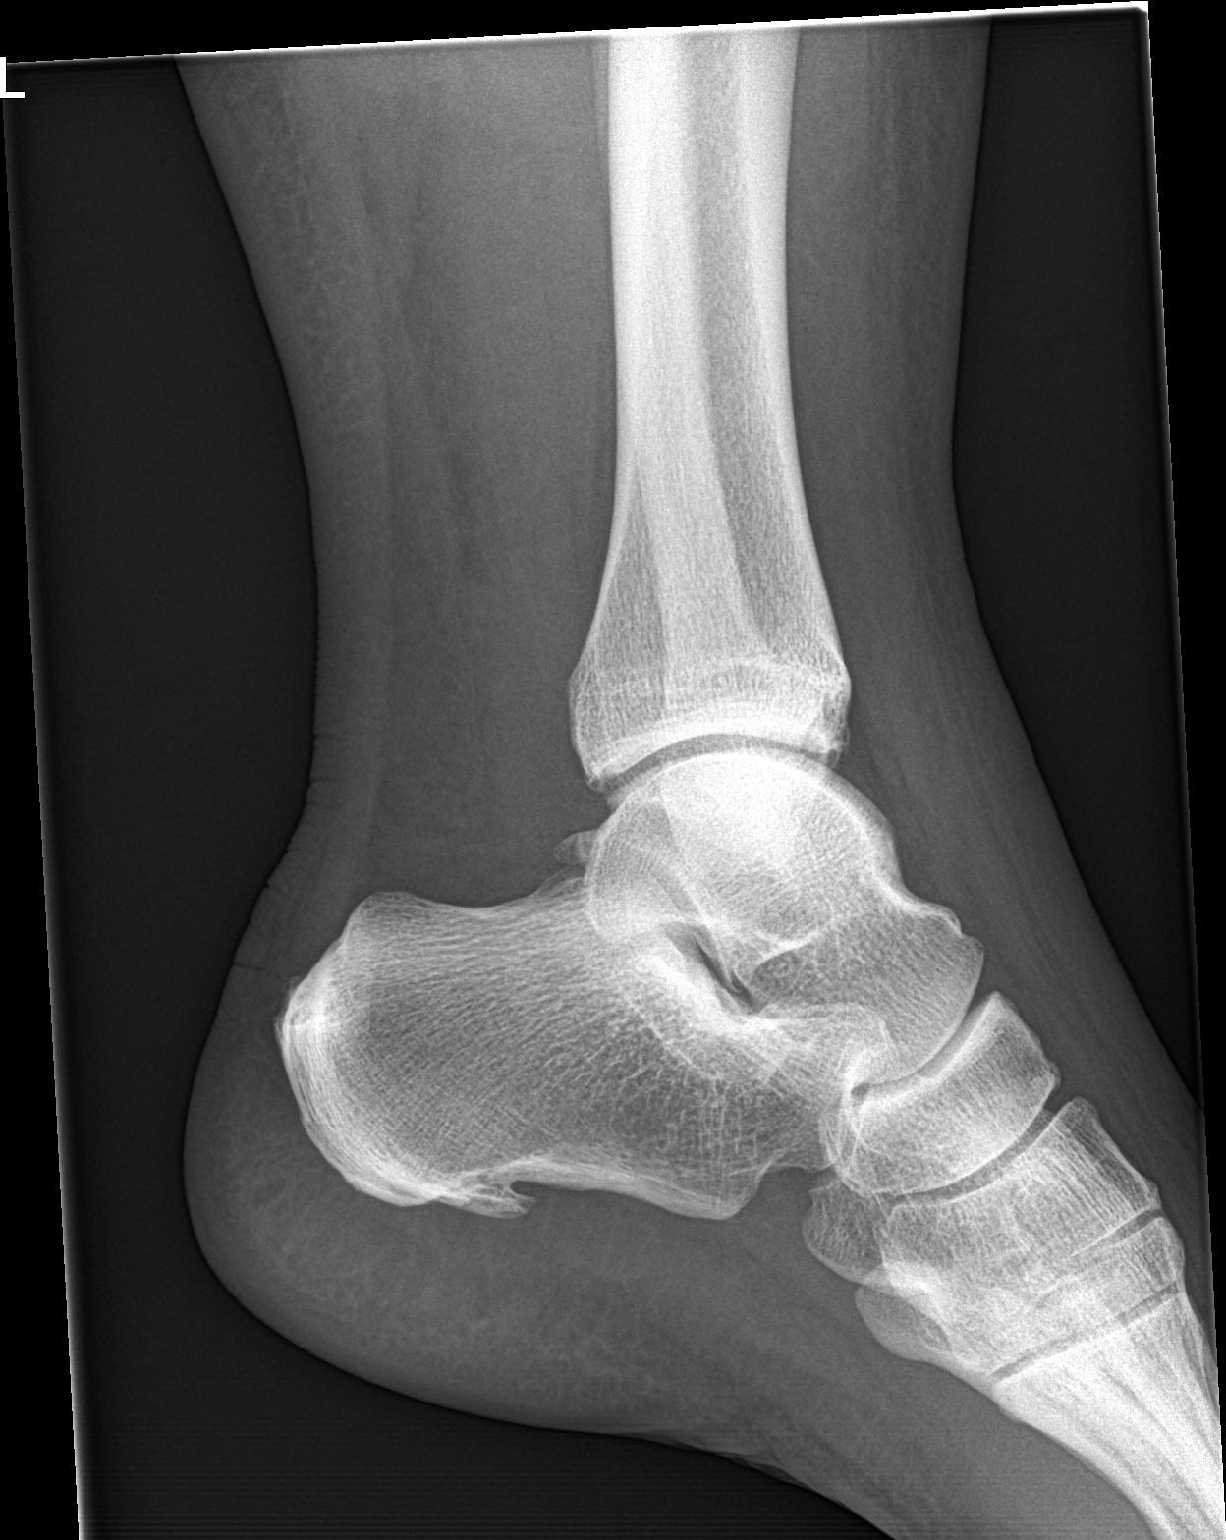

[2 of 2 positions shown; findings below may reference images not displayed]

FINDINGS: There is no evidence of fracture, dislocation, or joint effusion.
There is a small plantar calcaneal spur. Mild soft tissue edema
around the knee ankle and lower leg.
IMPRESSION: No acute osseous abnormality of the left ankle.

## 2015-02-22 ENCOUNTER — Telehealth: Payer: Self-pay | Admitting: *Deleted

## 2015-02-22 NOTE — Telephone Encounter (Signed)
Patient called to make an appt. Reminded him he has an appt set for 03/10/15 at 845 with Dr Luciana Axeomer.

## 2015-03-10 ENCOUNTER — Encounter: Payer: Self-pay | Admitting: Internal Medicine

## 2015-03-10 ENCOUNTER — Ambulatory Visit (INDEPENDENT_AMBULATORY_CARE_PROVIDER_SITE_OTHER): Payer: Medicare Other | Admitting: Internal Medicine

## 2015-03-10 VITALS — BP 131/80 | HR 79 | Temp 97.6°F | Ht 70.0 in | Wt 238.0 lb

## 2015-03-10 DIAGNOSIS — B182 Chronic viral hepatitis C: Secondary | ICD-10-CM

## 2015-03-10 LAB — COMPLETE METABOLIC PANEL WITH GFR
ALT: 440 U/L — AB (ref 0–53)
AST: 264 U/L — ABNORMAL HIGH (ref 0–37)
Albumin: 3.6 g/dL (ref 3.5–5.2)
Alkaline Phosphatase: 114 U/L (ref 39–117)
BUN: 13 mg/dL (ref 6–23)
CALCIUM: 9.6 mg/dL (ref 8.4–10.5)
CHLORIDE: 103 meq/L (ref 96–112)
CO2: 31 meq/L (ref 19–32)
CREATININE: 0.57 mg/dL (ref 0.50–1.35)
GFR, Est Non African American: >89 mL/min
GLUCOSE: 105 mg/dL — AB (ref 70–99)
Potassium: 4.5 mEq/L (ref 3.5–5.3)
Sodium: 141 mEq/L (ref 135–145)
Total Bilirubin: 0.6 mg/dL (ref 0.2–1.2)
Total Protein: 7.4 g/dL (ref 6.0–8.3)

## 2015-03-10 NOTE — Progress Notes (Signed)
   Subjective:    Patient ID: [name] PennerGary M [name], male    DOB: 08-Mar-1976, 39 y.o.   MRN: [id number]  HPI He is here for follow-up of hepatitis C. 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 He was hepatitis A and B immune and underwent vaccination. I previously had an elastography ordered however he was unable to comply with the breathing requirements. He also has a history of drug and alcohol abuse and now has been 8 months clean and has completed a program locally and continues to be in therapy twice a week. 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 Has no complaints today. He continues to have transaminitis. He previously had low platelets but more recently have been within normal limits.   Review of Systems  Constitutional: Negative for fatigue.  Gastrointestinal: Negative for nausea and diarrhea.  Skin: Negative for rash.  Neurological: Negative for dizziness and headaches.       Objective:   Physical Exam  Constitutional: He appears well-developed and well-nourished. No distress.  Eyes: No scleral icterus.  Cardiovascular: Normal rate, regular rhythm and normal heart sounds.   No murmur heard. Pulmonary/Chest: Effort normal and breath sounds normal. No respiratory distress.  Skin: No rash noted.          Assessment & Plan:

## 2015-03-10 NOTE — Assessment & Plan Note (Addendum)
He is doing well with his substance abuse. I will have him get elastography if he is able to and otherwise will probably do a liver biopsy if needed. I will prescribe Harvoni. I will update his labs as well. He is treatment nave. He has completed his vaccine series for hepatitis A and B. He will return after starting the medication. He will need to hold or reduce his acid reflux medication.

## 2015-03-11 LAB — HCV RNA QUANT RFLX ULTRA OR GENOTYP
HCV Quantitative Log: 5.88 {Log} — ABNORMAL HIGH (ref <1.18)
HCV Quantitative: 766486 IU/mL — ABNORMAL HIGH (ref <15)

## 2015-03-16 LAB — HEPATITIS C GENOTYPE

## 2015-03-31 ENCOUNTER — Ambulatory Visit (HOSPITAL_COMMUNITY)
Admission: RE | Admit: 2015-03-31 | Discharge: 2015-03-31 | Disposition: A | Discharge status: Home / Self Care | Payer: Medicare Other | Source: Ambulatory Visit | Attending: Internal Medicine | Admitting: Internal Medicine

## 2015-03-31 ENCOUNTER — Other Ambulatory Visit: Payer: Self-pay | Admitting: Internal Medicine

## 2015-03-31 DIAGNOSIS — B182 Chronic viral hepatitis C: Secondary | ICD-10-CM | POA: Diagnosis present

## 2015-03-31 MED ORDER — LEDIPASVIR-SOFOSBUVIR 90-400 MG PO TABS: 1.0000 | ORAL_TABLET | Freq: Every day | ORAL | Status: DC

## 2015-04-07 ENCOUNTER — Telehealth: Payer: Self-pay | Admitting: *Deleted

## 2015-04-07 ENCOUNTER — Other Ambulatory Visit: Payer: Self-pay | Admitting: Internal Medicine

## 2015-04-07 DIAGNOSIS — B1710 Acute hepatitis C without hepatic coma: Secondary | ICD-10-CM

## 2015-04-07 NOTE — Telephone Encounter (Signed)
Called and left patient a voice mail. He needs a lab appt in 4 weeks and follow up with Dr. Luciana Axe the week after labs. Wendall Mola

## 2015-04-26 ENCOUNTER — Emergency Department (HOSPITAL_COMMUNITY)
Admission: EM | Admit: 2015-04-26 | Discharge: 2015-04-27 | Disposition: A | Discharge status: Home / Self Care | Payer: Medicare Other | Attending: Emergency Medicine | Admitting: Emergency Medicine

## 2015-04-26 ENCOUNTER — Encounter (HOSPITAL_COMMUNITY): Payer: Self-pay | Admitting: Emergency Medicine

## 2015-04-26 DIAGNOSIS — F12288 Cannabis dependence with other cannabis-induced disorder: Secondary | ICD-10-CM | POA: Insufficient documentation

## 2015-04-26 DIAGNOSIS — Z720 Tobacco use: Secondary | ICD-10-CM | POA: Diagnosis not present

## 2015-04-26 DIAGNOSIS — F1994 Other psychoactive substance use, unspecified with psychoactive substance-induced mood disorder: Secondary | ICD-10-CM | POA: Diagnosis present

## 2015-04-26 DIAGNOSIS — F1324 Sedative, hypnotic or anxiolytic dependence with sedative, hypnotic or anxiolytic-induced mood disorder: Secondary | ICD-10-CM | POA: Diagnosis not present

## 2015-04-26 DIAGNOSIS — F319 Bipolar disorder, unspecified: Secondary | ICD-10-CM | POA: Insufficient documentation

## 2015-04-26 DIAGNOSIS — F1124 Opioid dependence with opioid-induced mood disorder: Secondary | ICD-10-CM | POA: Insufficient documentation

## 2015-04-26 DIAGNOSIS — F1424 Cocaine dependence with cocaine-induced mood disorder: Secondary | ICD-10-CM | POA: Diagnosis not present

## 2015-04-26 DIAGNOSIS — F1920 Other psychoactive substance dependence, uncomplicated: Secondary | ICD-10-CM | POA: Diagnosis present

## 2015-04-26 DIAGNOSIS — Z872 Personal history of diseases of the skin and subcutaneous tissue: Secondary | ICD-10-CM | POA: Insufficient documentation

## 2015-04-26 DIAGNOSIS — R45851 Suicidal ideations: Secondary | ICD-10-CM

## 2015-04-26 DIAGNOSIS — Z880 Allergy status to penicillin: Secondary | ICD-10-CM | POA: Diagnosis not present

## 2015-04-26 DIAGNOSIS — Z8619 Personal history of other infectious and parasitic diseases: Secondary | ICD-10-CM | POA: Diagnosis not present

## 2015-04-26 LAB — RAPID URINE DRUG SCREEN, HOSP PERFORMED
Amphetamines: NOT DETECTED
Barbiturates: NOT DETECTED
Benzodiazepines: POSITIVE — AB
COCAINE: POSITIVE — AB
OPIATES: NOT DETECTED
Tetrahydrocannabinol: POSITIVE — AB

## 2015-04-26 MED ORDER — ONDANSETRON HCL 4 MG PO TABS: 4.0000 mg | ORAL_TABLET | Freq: Three times a day (TID) | ORAL | Status: DC | PRN

## 2015-04-26 MED ORDER — NICOTINE 21 MG/24HR TD PT24
21.0000 mg | MEDICATED_PATCH | Freq: Every day | TRANSDERMAL | Status: DC
  Administered 2015-04-27: 21 mg via TRANSDERMAL
  Filled 2015-04-26: qty 1

## 2015-04-26 MED ORDER — ACETAMINOPHEN 325 MG PO TABS: 650.0000 mg | ORAL_TABLET | ORAL | Status: DC | PRN

## 2015-04-26 NOTE — ED Notes (Signed)
Pt wanded by security. 

## 2015-04-26 NOTE — ED Notes (Signed)
Pt states he has a hx of depression and takes medication for same  Pt states he has been having suicidal thoughts for the past two weeks  Pt states he recently got divorced and work has been harder than normal lately  Pt has a plan to jump off a bridge  Pt has past attempt at age 39

## 2015-04-26 NOTE — ED Provider Notes (Signed)
CSN: [id number]     Arrival date & time 04/26/15  2204 History  This chart was scribed for Antony MaduraKelly Domique Clapper, PA-C, working with Layla MawKristen N Ward, DO by Octavia HeirArianna Nassar, ED Scribe. This patient was seen in room [facility] and the patient's care was started at 11:53 PM.    Chief Complaint  Patient presents with  . Suicidal    HPI HPI Comments: [name] [name] is a 39 y.o. male who presents to the Emergency Department complaining of suicidal ideations onset a couple of weeks. Pt notes money and home stress are contributing factors to his ideations. He notes planning on jumping off of a bridge. His last attempt was at the age of 39. He notes he is on depression medication but reports he told his psychiatrist his medication is not working last week. Pt notes using marijuana, drinking a 12 pack, and pain pills recently.   Past Medical History  Diagnosis Date  . Bipolar disorder   . Hepatitis C   . Depression   . Cellulitis    Past Surgical History  Procedure Laterality Date  . Back surgery    . Shoulder surgery    . Rotator cuff repair     Family History  Problem Relation Age of Onset  . Alcohol abuse Father   . Cancer Maternal Aunt    History  Substance Use Topics  . Smoking status: Current Every Day Smoker -- 1.00 packs/day    Types: Cigarettes    Start date: 10/22/1981  . Smokeless tobacco: Never Used  . Alcohol Use: 0.0 oz/week    0 Standard drinks or equivalent per week     Comment: occ    Review of Systems  Psychiatric/Behavioral: Positive for suicidal ideas.  All other systems reviewed and are negative.     Allergies  Lithium and Penicillins  Home Medications   Prior to Admission medications   Medication Sig Start Date End Date Taking? Authorizing Provider  buprenorphine-naloxone (SUBOXONE) 8-2 MG SUBL SL tablet Place 1 tablet under the tongue 2 (two) times daily.   Yes Historical Provider, MD  buPROPion (WELLBUTRIN SR) 150 MG 12 hr tablet Take 450 mg by mouth daily.    Yes Historical Provider, MD  gabapentin (NEURONTIN) 400 MG capsule Take 2 capsules (800 mg total) by mouth 4 (four) times daily. 09/13/14  Yes Adonis BrookSheila Agustin, NP  Ledipasvir-Sofosbuvir (HARVONI) 90-400 MG TABS Take 1 tablet by mouth daily. 03/31/15  Yes Gardiner Barefootobert W Comer, MD  pantoprazole (PROTONIX) 40 MG tablet Take 1 tablet (40 mg total) by mouth daily at 6 (six) AM. 10/23/14  Yes Rodolph Bonganiel Thompson V, MD  sertraline (ZOLOFT) 100 MG tablet Take 1 tablet (100 mg total) by mouth daily. 09/14/14  Yes Adonis BrookSheila Agustin, NP  traZODone (DESYREL) 100 MG tablet Take 100 mg by mouth at bedtime as needed for sleep (sleep).    Yes Historical Provider, MD   Triage vitals: BP 140/71 mmHg  Pulse 79  Temp(Src) 98.2 F (36.8 C)  Resp 18  SpO2 95%  Physical Exam  Constitutional: He is oriented to person, place, and time. He appears well-developed and well-nourished. No distress.  HENT:  Head: Normocephalic and atraumatic.  Eyes: Conjunctivae and EOM are normal. No scleral icterus.  Neck: Normal range of motion.  Pulmonary/Chest: Effort normal. No respiratory distress.  Musculoskeletal: Normal range of motion.  Neurological: He is alert and oriented to person, place, and time. He exhibits normal muscle tone. Coordination normal.  Skin: Skin is warm and dry.  No rash noted. He is not diaphoretic. No erythema. No pallor.  Psychiatric: His speech is normal. He is withdrawn. He exhibits a depressed mood. He expresses suicidal ideation. He expresses no homicidal ideation. He expresses suicidal plans. He expresses no homicidal plans.  Nursing note and vitals reviewed.   ED Course  Procedures  DIAGNOSTIC STUDIES: Oxygen Saturation is 95% on RA, adequate by my interpretation.  Labs Review Labs Reviewed  ACETAMINOPHEN LEVEL - Abnormal; Notable for the following:    Acetaminophen (Tylenol), Serum <10 (*)    All other components within normal limits  COMPREHENSIVE METABOLIC PANEL - Abnormal; Notable for the following:     Potassium 3.4 (*)    Glucose, Bld 121 (*)    Calcium 8.6 (*)    Albumin 3.4 (*)    AST 43 (*)    All other components within normal limits  ETHANOL - Abnormal; Notable for the following:    Alcohol, Ethyl (B) 13 (*)    All other components within normal limits  URINE RAPID DRUG SCREEN, HOSP PERFORMED - Abnormal; Notable for the following:    Cocaine POSITIVE (*)    Benzodiazepines POSITIVE (*)    Tetrahydrocannabinol POSITIVE (*)    All other components within normal limits  CBC  SALICYLATE LEVEL    Imaging Review No results found.   EKG Interpretation None      MDM   Final diagnoses:  Suicidal ideations    39 year old male presents to the emergency department for further evaluation of suicidal ideations. UDS positive for cocaine, benzodiazepine's, and marijuana. Ethanol level is 13. Patient has been medically cleared and evaluated by TTS. He meets criteria for inpatient treatment; however, no beds are available at [facility] currently. Placement is currently pending. Disposition to be determined by oncoming ED provider.  I personally performed the services described in this documentation, which was scribed in my presence. The recorded information has been reviewed and is accurate.   Filed Vitals:   04/26/15 2224  BP: 140/71  Pulse: 79  Temp: 98.2 F (36.8 C)  Resp: 18  SpO2: 95%      Antony Madura, PA-C 04/27/15 0458  Layla Maw Ward, DO 04/27/15 1478

## 2015-04-27 DIAGNOSIS — F1424 Cocaine dependence with cocaine-induced mood disorder: Secondary | ICD-10-CM | POA: Diagnosis not present

## 2015-04-27 DIAGNOSIS — F1994 Other psychoactive substance use, unspecified with psychoactive substance-induced mood disorder: Secondary | ICD-10-CM | POA: Diagnosis present

## 2015-04-27 LAB — COMPREHENSIVE METABOLIC PANEL
ALBUMIN: 3.4 g/dL — AB (ref 3.5–5.0)
ALK PHOS: 80 U/L (ref 38–126)
ALT: 38 U/L (ref 17–63)
AST: 43 U/L — AB (ref 15–41)
Anion gap: 7 (ref 5–15)
BILIRUBIN TOTAL: 0.4 mg/dL (ref 0.3–1.2)
BUN: 14 mg/dL (ref 6–20)
CO2: 27 mmol/L (ref 22–32)
Calcium: 8.6 mg/dL — ABNORMAL LOW (ref 8.9–10.3)
Chloride: 104 mmol/L (ref 101–111)
Creatinine, Ser: 0.75 mg/dL (ref 0.61–1.24)
GFR calc Af Amer: >60 mL/min (ref >60)
GFR calc non Af Amer: >60 mL/min (ref >60)
Glucose, Bld: 121 mg/dL — ABNORMAL HIGH (ref 65–99)
POTASSIUM: 3.4 mmol/L — AB (ref 3.5–5.1)
SODIUM: 138 mmol/L (ref 135–145)
Total Protein: 7.3 g/dL (ref 6.5–8.1)

## 2015-04-27 LAB — ACETAMINOPHEN LEVEL: Acetaminophen (Tylenol), Serum: <10 ug/mL — ABNORMAL LOW (ref 10–30)

## 2015-04-27 LAB — CBC
HEMATOCRIT: 40.6 % (ref 39.0–52.0)
Hemoglobin: 13.8 g/dL (ref 13.0–17.0)
MCH: 31.4 pg (ref 26.0–34.0)
MCHC: 34 g/dL (ref 30.0–36.0)
MCV: 92.5 fL (ref 78.0–100.0)
Platelets: UNDETERMINED 10*3/uL (ref 150–400)
RBC: 4.39 MIL/uL (ref 4.22–5.81)
RDW: 14 % (ref 11.5–15.5)
WBC: 10.5 10*3/uL (ref 4.0–10.5)

## 2015-04-27 LAB — ETHANOL: Alcohol, Ethyl (B): 13 mg/dL — ABNORMAL HIGH (ref <5)

## 2015-04-27 LAB — SALICYLATE LEVEL: Salicylate Lvl: <4 mg/dL (ref 2.8–30.0)

## 2015-04-27 MED ORDER — LORAZEPAM 1 MG PO TABS: 1.0000 mg | ORAL_TABLET | Freq: Once | ORAL | Status: DC

## 2015-04-27 MED ORDER — LORAZEPAM 0.5 MG PO TABS
1.0000 mg | ORAL_TABLET | Freq: Once | ORAL | Status: AC
  Administered 2015-04-27: 1 mg via ORAL
  Filled 2015-04-27: qty 2

## 2015-04-27 NOTE — [facility] Suicide Risk Assessment (Signed)
Suicide Risk Assessment  Discharge Assessment   [facility] Discharge Suicide Risk Assessment   Demographic Factors:  Male and Caucasian  Total Time spent with patient: 45 minutes   Musculoskeletal: Strength & Muscle Tone: within normal limits Gait & Station: normal Patient leans: N/A  Psychiatric Specialty Exam: Physical Exam  Review of Systems  Constitutional: Negative.   HENT: Negative.   Eyes: Negative.   Respiratory: Negative.   Cardiovascular: Negative.   Gastrointestinal: Negative.   Genitourinary: Negative.   Musculoskeletal: Negative.   Skin: Negative.   Neurological: Negative.   Endo/Heme/Allergies: Negative.   Psychiatric/Behavioral: Positive for suicidal ideas and substance abuse.    Blood pressure 115/70, pulse 60, temperature 97.8 F (36.6 C), temperature source Oral, resp. rate 18, SpO2 98 %.There is no weight on file to calculate BMI.  General Appearance: Disheveled  Eye Contact::  Good  Speech:  Normal Rate  Volume:  Normal  Mood:  Anxious  Affect:  Congruent  Thought Process:  Coherent  Orientation:  Full (Time, Place, and Person)  Thought Content:  WDL  Suicidal Thoughts:  No  Homicidal Thoughts:  No  Memory:  Immediate;   Good Recent;   Good Remote;   Good  Judgement:  Fair  Insight:  Fair  Psychomotor Activity:  Normal  Concentration:  Good  Recall:  Good  Fund of Knowledge:Good  Language: Good  Akathisia:  No  Handed:  Right  AIMS (if indicated):     Assets:  Housing Leisure Time Physical Health Social Support  ADL's:  Intact  Cognition: WNL  Sleep:         Has this patient used any form of tobacco in the last 30 days? (Cigarettes, Smokeless Tobacco, Cigars, and/or Pipes) Yes, A prescription for an FDA-approved tobacco cessation medication was offered at discharge and the patient refused  Mental Status Per Nursing Assessment::   On Admission:   Drug abuse with suicidal ideations  Current Mental Status by Physician: NA  Loss  Factors: NA  Historical Factors: NA  Risk Reduction Factors:   Sense of responsibility to family, Living with another person, especially a relative, Positive social support and Positive therapeutic relationship  Continued Clinical Symptoms:  Anxiety, mild  Cognitive Features That Contribute To Risk:  None    Suicide Risk:  Minimal: No identifiable suicidal ideation.  Patients presenting with no risk factors but with morbid ruminations; may be classified as minimal risk based on the severity of the depressive symptoms  Principal Problem: Substance induced mood disorder Discharge Diagnoses:  Patient Active Problem List   Diagnosis Date Noted  . Substance induced mood disorder [F19.94] 04/27/2015    Priority: High  . Polysubstance (including opioids) dependence with physiological dependence [F19.20] 01/27/2013    Priority: High    Class: Acute  . Suicidal ideation [R45.851]   . Recurrent cellulitis of lower leg [L03.119] 11/09/2014  . Venous (peripheral) insufficiency [I87.2] 11/09/2014  . Bipolar affective disorder [F31.9]   . Alcohol dependence with alcohol-induced mood disorder [F10.24]   . Opioid dependence with opioid-induced mood disorder [F11.24]   . MDD (major depressive disorder), recurrent severe, without psychosis [F33.2] 09/09/2014  . MDD (major depressive disorder) [F32.2] 09/09/2014  . Suicidal ideations [R45.851]   . Chronic pain syndrome [G89.4] 05/31/2014  . Tobacco dependence [F17.200] 05/30/2014  . Poor dentition [K08.8] 02/03/2014  . Hepatitis C virus infection without hepatic coma [B19.20] 02/03/2014  . Sepsis [A41.9] 01/29/2014  . Anxiety state, unspecified [F41.1] 01/31/2013  . Mood disorder in conditions  classified elsewhere [F06.30] 01/31/2013  . Alcohol withdrawal [F10.239] 01/27/2013    Class: Acute      Plan Of Care/Follow-up recommendations:  Activity:   as tolerated Diet:  heart healthy diet  Is patient on multiple antipsychotic  therapies at discharge:  No   Has Patient had three or more failed trials of antipsychotic monotherapy by history:  No  Recommended Plan for Multiple Antipsychotic Therapies: NA    LORD, JAMISON, PMH-NP 04/27/2015, 12:44 PM

## 2015-04-27 NOTE — Consult Note (Signed)
[facility] Consult   Reason for Consult:  Substance abuse, suicidal ideations Referring Physician:  EDP Patient Identification: [name] [name]:  [id number] Principal Diagnosis: Substance induced mood disorder Diagnosis:   Patient Active Problem List   Diagnosis Date Noted  . Substance induced mood disorder [F19.94] 04/27/2015    Priority: High  . Polysubstance (including opioids) dependence with physiological dependence [F19.20] 01/27/2013    Priority: High    Class: Acute  . Suicidal ideation [R45.851]   . Recurrent cellulitis of lower leg [L03.119] 11/09/2014  . Venous (peripheral) insufficiency [I87.2] 11/09/2014  . Bipolar affective disorder [F31.9]   . Alcohol dependence with alcohol-induced mood disorder [F10.24]   . Opioid dependence with opioid-induced mood disorder [F11.24]   . MDD (major depressive disorder), recurrent severe, without psychosis [F33.2] 09/09/2014  . MDD (major depressive disorder) [F32.2] 09/09/2014  . Suicidal ideations [R45.851]   . Chronic pain syndrome [G89.4] 05/31/2014  . Tobacco dependence [F17.200] 05/30/2014  . Poor dentition [K08.8] 02/03/2014  . Hepatitis C virus infection without hepatic coma [B19.20] 02/03/2014  . Sepsis [A41.9] 01/29/2014  . Anxiety state, unspecified [F41.1] 01/31/2013  . Mood disorder in conditions classified elsewhere [F06.30] 01/31/2013  . Alcohol withdrawal [F10.239] 01/27/2013    Class: Acute    Total Time spent with patient: 45 minutes  Subjective:   [name] [name] is a 39 y.o. male patient does not warrant admission.  HPI:  The patient presented to the ED after using cocaine, benzodiazepines, alcohol, and marijuana.  He has been going to a Sub[facility] Clinic and attending groups.  He reports being clean from drugs for six months until yesterday, except for Suboxone.  Today, he denies suicidal ideations and past attempts and psychiatric hospitalization.  Denies hallucinations and homicidal ideations.   He does have a place to live and can return there. HPI Elements:   Location:  generalized. Quality:  acute. Severity:  mild. Timing:  intermittent. Duration:  brief. Context:  drug abuse.  Past Medical History:  Past Medical History  Diagnosis Date  . Bipolar disorder   . Hepatitis C   . Depression   . Cellulitis     Past Surgical History  Procedure Laterality Date  . Back surgery    . Shoulder surgery    . Rotator cuff repair     Family History:  Family History  Problem Relation Age of Onset  . Alcohol abuse Father   . Cancer Maternal Aunt    Social History:  History  Alcohol Use  . 0.0 oz/week  . 0 Standard drinks or equivalent per week    Comment: occ     History  Drug Use No    Comment: denies at this time    History   Social History  . Marital Status: Divorced    Spouse Name: N/A  . Number of Children: N/A  . Years of Education: N/A   Social History Main Topics  . Smoking status: Current Every Day Smoker -- 1.00 packs/day    Types: Cigarettes    Start date: 10/22/1981  . Smokeless tobacco: Never Used  . Alcohol Use: 0.0 oz/week    0 Standard drinks or equivalent per week     Comment: occ  . Drug Use: No     Comment: denies at this time  . Sexual Activity: Not Currently   Other Topics Concern  . None   Social History Narrative   Additional Social History:    Pain Medications: See PTA, reports  he has been on suboxone for the past 6 month, was previously abusing pain pills and heroin per self report Prescriptions: See PTA, reports takes as prescribed  Over the Counter: See PTA  History of alcohol / drug use?: Yes Longest period of sobriety (when/how long): 1 year for etoh, reports hx of seizures with withdrawal, reports does not know date of last seizure  Negative Consequences of Use: Personal relationships Withdrawal Symptoms:  (denies at present time) Name of Substance 1: etoh 1 - Age of First Use: 12-13 1 - Amount (size/oz): 12 pack of  beer 1 - Frequency: reports  had not been drinking for a year and then started drinking again, frequency unclear 1 - Duration: on and off for years  1 - Last Use / Amount: 04-26-15 12 pack of beer Name of Substance 2: THC 2 - Age of First Use: 12-13 2 - Amount (size/oz): uncertain  2 - Frequency: unknown 'not that often" 2 - Duration: years 2 - Last Use / Amount: "last week" Name of Substance 3: opioids, heroin and pain pills  3 - Age of First Use: unknown 3 - Amount (size/oz): unknown 3 - Frequency: unknown 3 - Duration: "for a long time" 3 - Last Use / Amount: reports started suboxone about 6 months ago. Did not test positive for opioids  Name of Substance 4: cocaine, pt denies use but tested positive for cocaine 4 - Age of First Use: unknown 4 - Amount (size/oz): unknown 4 - Frequency: unknown 4 - Duration: unknown 4 - Last Use / Amount: unknown              Allergies:   Allergies  Allergen Reactions  . Lithium Anaphylaxis  . Penicillins Rash    Labs:  Results for orders placed or performed during the hospital encounter of 04/26/15 (from the past 48 hour(s))  Acetaminophen level     Status: Abnormal   Collection Time: 04/26/15  9:48 PM  Result Value Ref Range   Acetaminophen (Tylenol), Serum <10 (L) 10 - 30 ug/mL    Comment:        THERAPEUTIC CONCENTRATIONS VARY SIGNIFICANTLY. A RANGE OF 10-30 ug/mL MAY BE AN EFFECTIVE CONCENTRATION FOR MANY PATIENTS. HOWEVER, SOME ARE BEST TREATED AT CONCENTRATIONS OUTSIDE THIS RANGE. ACETAMINOPHEN CONCENTRATIONS >150 ug/mL AT 4 HOURS AFTER INGESTION AND >50 ug/mL AT 12 HOURS AFTER INGESTION ARE OFTEN ASSOCIATED WITH TOXIC REACTIONS.   Ethanol (ETOH)     Status: Abnormal   Collection Time: 04/26/15  9:48 PM  Result Value Ref Range   Alcohol, Ethyl (B) 13 (H) <5 mg/dL    Comment:        LOWEST DETECTABLE LIMIT FOR SERUM ALCOHOL IS 5 mg/dL FOR MEDICAL PURPOSES ONLY   Salicylate level     Status: None   Collection  Time: 04/26/15  9:48 PM  Result Value Ref Range   Salicylate Lvl <0.2 2.8 - 30.0 mg/dL  Urine rapid drug screen (hosp performed)not at [facility]     Status: Abnormal   Collection Time: 04/26/15 10:51 PM  Result Value Ref Range   Opiates NONE DETECTED NONE DETECTED   Cocaine POSITIVE (A) NONE DETECTED   Benzodiazepines POSITIVE (A) NONE DETECTED   Amphetamines NONE DETECTED NONE DETECTED   Tetrahydrocannabinol POSITIVE (A) NONE DETECTED   Barbiturates NONE DETECTED NONE DETECTED    Comment:        DRUG SCREEN FOR MEDICAL PURPOSES ONLY.  IF CONFIRMATION IS NEEDED FOR ANY PURPOSE, NOTIFY LAB WITHIN 5  DAYS.        LOWEST DETECTABLE LIMITS FOR URINE DRUG SCREEN Drug Class       Cutoff (ng/mL) Amphetamine      1000 Barbiturate      200 Benzodiazepine   509 Tricyclics       326 Opiates          300 Cocaine          300 THC              50   CBC     Status: None   Collection Time: 04/26/15 11:48 PM  Result Value Ref Range   WBC 10.5 4.0 - 10.5 K/uL   RBC 4.39 4.22 - 5.81 MIL/uL   Hemoglobin 13.8 13.0 - 17.0 g/dL   HCT 40.6 39.0 - 52.0 %   MCV 92.5 78.0 - 100.0 fL   MCH 31.4 26.0 - 34.0 pg   MCHC 34.0 30.0 - 36.0 g/dL   RDW 14.0 11.5 - 15.5 %   Platelets PLATELET CLUMPS NOTED ON SMEAR, UNABLE TO ESTIMATE 150 - 400 K/uL  Comprehensive metabolic panel     Status: Abnormal   Collection Time: 04/26/15 11:48 PM  Result Value Ref Range   Sodium 138 135 - 145 mmol/L   Potassium 3.4 (L) 3.5 - 5.1 mmol/L   Chloride 104 101 - 111 mmol/L   CO2 27 22 - 32 mmol/L   Glucose, Bld 121 (H) 65 - 99 mg/dL   BUN 14 6 - 20 mg/dL   Creatinine, Ser 0.75 0.61 - 1.24 mg/dL   Calcium 8.6 (L) 8.9 - 10.3 mg/dL   Total Protein 7.3 6.5 - 8.1 g/dL   Albumin 3.4 (L) 3.5 - 5.0 g/dL   AST 43 (H) 15 - 41 U/L   ALT 38 17 - 63 U/L   Alkaline Phosphatase 80 38 - 126 U/L   Total Bilirubin 0.4 0.3 - 1.2 mg/dL   GFR calc non Af Amer >60 >60 mL/min   GFR calc Af Amer >60 >60 mL/min    Comment: (NOTE) The eGFR  has been calculated using the CKD EPI equation. This calculation has not been validated in all clinical situations. eGFR's persistently <60 mL/min signify possible Chronic Kidney Disease.    Anion gap 7 5 - 15    Vitals: Blood pressure 115/70, pulse 60, temperature 97.8 F (36.6 C), temperature source Oral, resp. rate 18, SpO2 98 %.  Risk to Self: Suicidal Ideation: Yes-Currently Present Suicidal Intent: Yes-Currently Present Is patient at risk for suicide?: Yes Suicidal Plan?: Yes-Currently Present Specify Current Suicidal Plan: to jump off bridge or overdose on drugs/medication  Access to Means: Yes Specify Access to Suicidal Means: bridge, medications What has been your use of drugs/alcohol within the last 12 months?: Pt reports he has a hx of abusing alcohol with one year sober, but started drinking again 12 pack per day, uses THC, and hx of opioid and heroin abuse. Currently on suboxone. Tested positive for cocaine and benzos which he denies taking  How many times?: 1 (at age 63 per pt) Other Self Harm Risks: none Triggers for Past Attempts: Other (Comment) (work and relationship stressors) Intentional Self Injurious Behavior: None Risk to Others: Homicidal Ideation: No Thoughts of Harm to Others: No Current Homicidal Intent: No Current Homicidal Plan: No Access to Homicidal Means: No Identified Victim: none History of harm to others?: No Assessment of Violence: None Noted Violent Behavior Description: none Does patient have access to weapons?: No  Criminal Charges Pending?: No Does patient have a court date: No Prior Inpatient Therapy: Prior Inpatient Therapy: Yes Prior Therapy Dates: 2003, 2005, 08/2014 Prior Therapy Facilty/Provider(s): [facility], [name]  Reason for Treatment: SA, Bipolar, Cocaine abuse  Prior Outpatient Therapy: Prior Outpatient Therapy: Yes Prior Therapy Dates: current Prior Therapy Facilty/Provider(s): [facility] -previously, Dr. Nolon Rod currently at  [facility]  Reason for Treatment: SA, depression, anxiety  Does patient have an ACCT team?: No Does patient have Intensive In-House Services?  : No Does patient have [facility] services? : No Does patient have [facility] services?: No  Current Facility-Administered Medications  Medication Dose Route Frequency Provider Last Rate Last Dose  . acetaminophen (TYLENOL) tablet 650 mg  650 mg Oral Q4H PRN Antonietta Breach, PA-C      . nicotine (NICODERM CQ - dosed in mg/24 hours) patch 21 mg  21 mg Transdermal Daily Antonietta Breach, PA-C   21 mg at 04/27/15 [id number]  . ondansetron (ZOFRAN) tablet 4 mg  4 mg Oral Q8H PRN Antonietta Breach, PA-C       Current Outpatient Prescriptions  Medication Sig Dispense Refill  . buPROPion (WELLBUTRIN SR) 150 MG 12 hr tablet Take 450 mg by mouth daily.    Marland Kitchen gabapentin (NEURONTIN) 400 MG capsule Take 2 capsules (800 mg total) by mouth 4 (four) times daily. 240 capsule 0  . Ledipasvir-Sofosbuvir (HARVONI) 90-400 MG TABS Take 1 tablet by mouth daily. 28 tablet 2  . pantoprazole (PROTONIX) 40 MG tablet Take 1 tablet (40 mg total) by mouth daily at 6 (six) AM. 30 tablet 0  . sertraline (ZOLOFT) 100 MG tablet Take 1 tablet (100 mg total) by mouth daily. 30 tablet 0  . traZODone (DESYREL) 100 MG tablet Take 100 mg by mouth at bedtime as needed for sleep (sleep).     . LORazepam (ATIVAN) 1 MG tablet Take 1 tablet (1 mg total) by mouth once. 4 tablet 0    Musculoskeletal: Strength & Muscle Tone: within normal limits Gait & Station: normal Patient leans: N/A  Psychiatric Specialty Exam: Physical Exam  Review of Systems  Constitutional: Negative.   HENT: Negative.   Eyes: Negative.   Respiratory: Negative.   Cardiovascular: Negative.   Gastrointestinal: Negative.   Genitourinary: Negative.   Musculoskeletal: Negative.   Skin: Negative.   Neurological: Negative.   Endo/Heme/Allergies: Negative.   Psychiatric/Behavioral: Positive for suicidal ideas and substance abuse.    Blood  pressure 115/70, pulse 60, temperature 97.8 F (36.6 C), temperature source Oral, resp. rate 18, SpO2 98 %.There is no weight on file to calculate BMI.  General Appearance: Disheveled  Eye Contact::  Good  Speech:  Normal Rate  Volume:  Normal  Mood:  Anxious  Affect:  Congruent  Thought Process:  Coherent  Orientation:  Full (Time, Place, and Person)  Thought Content:  WDL  Suicidal Thoughts:  No  Homicidal Thoughts:  No  Memory:  Immediate;   Good Recent;   Good Remote;   Good  Judgement:  Fair  Insight:  Fair  Psychomotor Activity:  Normal  Concentration:  Good  Recall:  Good  Fund of Knowledge:Good  Language: Good  Akathisia:  No  Handed:  Right  AIMS (if indicated):     Assets:  Housing Leisure Time Physical Health Social Support  ADL's:  Intact  Cognition: WNL  Sleep:      Medical Decision Making: Review of Psycho-Social Stressors (1), Review or order clinical lab tests (1) and Review of Medication Regimen & Side Effects (  2)  Treatment Plan Summary: Daily contact with patient to assess and evaluate symptoms and progress in treatment, Medication management and Plan discharge home with follow-up at his [facility] for substance abuse classes and medications  Plan:  No evidence of imminent risk to self or others at present.   Disposition: discharge home with follow-up at his [facility] for substance abuse classes and medications  Waylan Boga, PMH-NP 04/27/2015 12:36 PM

## 2015-04-27 NOTE — ED Notes (Signed)
Pt resting in bed with eyes closed, no sign or symptoms of distress. 15 min safety checks preformed.

## 2015-04-27 NOTE — BH Assessment (Signed)
Reviewed ED notes prior to initiating assessment. Per notes pt has had SI for the past couple of week with thoughts of jumping off a bridge. Pt reports he is on medication for depression, stating he told his doctor last week that the medication is not working. Pt reported he used THC, drank a 12 pack of beer and has been using pain pills. He tested positive for etoh, benzos, cocaine, and THC. Assessment to commence shortly.    Clista BernhardtNancy Antonie Borjon, [facility] Triage Specialist 04/27/2015 1:02 AM

## 2015-04-27 NOTE — ED Notes (Addendum)
Pt alert and oriented to self, place, time and event. D/C home as ordered and was cooperative with D/C procedure. Denies SI, HI, AVH and pain when assessed. Pt ambulatory with a steady gait, vitals done and WNL, no physical distress [facility] D/C instructions and prescription reviewed with pt, understanding verbalized. Pt's belongings from locker 36 and Subuxone 92 pills picked up from pharmacy by writer was given to him at time of exit from facility;  both pharmacy form and belonging sheet signed as per protocol. Support, availability and encouragement offered. Safety maintained on Q 15 minutes checks as ordered till time of d/c.

## 2015-04-27 NOTE — ED Notes (Signed)
Pt resting quietly in bed. 15 min safety checks continued

## 2015-04-27 NOTE — ED Notes (Addendum)
Pt alert and oriented x3. Pt denies pain, and HI/AVH to this nurse. But is having SI. "I want to jump off bridge."  Pt stated "I have had off and on depression." Patient stated to this writer he has recently became depressed and unable to shake it. Patient stated one of the stressor is relationship issues. Patient requested TV to be turned off so he could get some sleep. Pt is currently on unit in bed with safety checks  every 15 mins.  Home medications counted Patient home med is Hydroxyz Pam 25mg  capsules filled 10/04/2014. There was 92 capsules in bottle, and one white tablet unknown medication. Count was verified by this Clinical research associatewriter and fellow RN.

## 2015-04-27 NOTE — BH Assessment (Addendum)
Tele Assessment Note   [name] [name] is an 39 y.o. male presenting to ED reporting worsening depression and suicidal ideation for the past two weeks. 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 He reports suicidal ideation with planning to jump off a bridge or overdose. Pt reports SI and depression have been getting worse and worse the past two weeks and he came to ED to get help. He believes he will act on thoughts to harm himself, because he is feeling so poorly and it is not improving.   At the time of assessment pt is alert and oriented times 4 with depressed and anxious mood. Affect is mostly appropriate to situation, however, labile at one point. As this Clinical research associate began asking questions pt was cooperative, and then became upset a few minutes into the assessment, noting that he did not understand why this writer was asking these questions if "you are just going to come back in here in three hours and discharge me, I came here for help." After the assessment process was explained again to pt he remained cooperative and calm for the rest of the assessment. Speech was logical and coherent. Judgement impaired. Pt reports SI with planning, denies HI, AVH, and self harm. Pt reports hx of SA.  Pt reports he has been dealing with depression on and off for years. He reports he does not have periods of complete relief, but does have periods where his sx are mostly better. He reports recent episode began two weeks ago, with loss of pleasure, loss of motivation, crying spells, isolating, decreased self-care, trouble sleeping, loss of appetite, irritability, and SI with planning. Pt denies past hx of mania or hypomania, but per [facility] was previously dx with bipolar disorder. Pt reports wellbutrin, zoloft, and neurontin have worked well for managing his depression but do not appear to be working well currently. He reports he is upset that this previously successful combination is not working at present. Pt  reports one past suicide attempt at age 35 when he was facing similar stressors.   Pt reports hx of anxiety with persistent worry about normal life stressors. Pt reports he has hx of panic attacks with marked increase in frequency lately. Pt denies hx of abuse or neglect. Denies sx of PTSD, OCD, or specific phobia.   Pt reports he began drinking at age 13 or 53. Pt reports he has sober for a year and then started drinking again. He reports he does not want to drink but "if I already feel miserable." Pt reports he drank a 12 pack of beer today. Reports hx of seizures with withdrawal. Pt reports infrequent use of THC. Pt reports he took a couple of Xanax recently. Denies use of cocaine but tested positive for it and has been seen in ED in the past for cocaine abuse. Pt reports hx of abusing pain pills and heroin, did not supply details, reports he used "for a long time." Reports he has been going to suboxone clinic for the past 6 months.   Pt reports family hx is positive for depression and anxiety (maternal side) and SA both sides. He reports his sister has attempted suicide in the past.   Pt is unable to contract for safety at this time.   Axis I:  296.23 Major Depressive Disorder, recurrent, severe, without psychotic features  Rule out Bipolar Disorder  303.90 Alcohol Use Disorder, severe  304.00 Opioid Use Disorder, severe on maintenance program  304.30 Cannabis Use Disorder, moderate  Rule  out Anxiolytic Use Disorder, Rule out Cocaine Use Disorder   Past Medical History:  Past Medical History  Diagnosis Date  . Bipolar disorder   . Hepatitis C   . Depression   . Cellulitis     Past Surgical History  Procedure Laterality Date  . Back surgery    . Shoulder surgery    . Rotator cuff repair      Family History:  Family History  Problem Relation Age of Onset  . Alcohol abuse Father   . Cancer Maternal Aunt     Social History:  reports that he has been smoking Cigarettes.  He  started smoking about 33 years ago. He has been smoking about 1.00 pack per day. He has never used smokeless tobacco. He reports that he drinks alcohol. He reports that he does not use illicit drugs.  Additional Social History:  Alcohol / Drug Use Pain Medications: See PTA, reports he has been on suboxone for the past 6 month, was previously abusing pain pills and heroin per self report Prescriptions: See PTA, reports takes as prescribed  Over the Counter: See PTA  History of alcohol / drug use?: Yes Longest period of sobriety (when/how long): 1 year for etoh, reports hx of seizures with withdrawal, reports does not know date of last seizure  Negative Consequences of Use: Personal relationships Withdrawal Symptoms:  (denies at present time) Substance #1 Name of Substance 1: etoh 1 - Age of First Use: [age] 1 - Amount (size/oz): 12 pack of beer 1 - Frequency: reports  had not been drinking for a year and then started drinking again, frequency unclear 1 - Duration: on and off for years  1 - Last Use / Amount: 04-26-15 12 pack of beer Substance #2 Name of Substance 2: THC 2 - Age of First Use: 12-13 2 - Amount (size/oz): uncertain  2 - Frequency: unknown 'not that often" 2 - Duration: years 2 - Last Use / Amount: "last week" Substance #3 Name of Substance 3: opioids, heroin and pain pills  3 - Age of First Use: unknown 3 - Amount (size/oz): unknown 3 - Frequency: unknown 3 - Duration: "for a long time" 3 - Last Use / Amount: reports started suboxone about 6 months ago. Did not test positive for opioids  Substance #4 Name of Substance 4: cocaine, pt denies use but tested positive for cocaine 4 - Age of First Use: unknown 4 - Amount (size/oz): unknown 4 - Frequency: unknown 4 - Duration: unknown 4 - Last Use / Amount: unknown   CIWA: CIWA-Ar BP: 140/71 mmHg Pulse Rate: 79 COWS:    PATIENT STRENGTHS: (choose at least two) Communication skills Work skills  Allergies:   Allergies  Allergen Reactions  . Lithium Anaphylaxis  . Penicillins Rash    Home Medications:  (Not in a hospital admission)  OB/GYN Status:  No LMP for male patient.  General Assessment Data Location of Assessment: [facility] TTS Assessment: In system Is this a Tele or Face-to-Face Assessment?: Face-to-Face Is this an Initial Assessment or a Re-assessment for this encounter?: Initial Assessment Marital status: Single Is patient pregnant?: No Pregnancy Status: No Living Arrangements: Alone Can pt return to current living arrangement?: Yes Admission Status: Voluntary Is patient capable of signing voluntary admission?: Yes Referral Source: Self/Family/Friend Insurance type: Medicare     Crisis Care Plan Living Arrangements: Alone Name of Psychiatrist: reports sees Dr.Stallings at [facility], [facility]  Name of Therapist: at clinic [facility]"  Education Status  Is patient currently in school?: No Current Grade: NA Highest grade of school patient has completed: 12 Name of school: NA Contact person: NA  Risk to self with the past 6 months Suicidal Ideation: Yes-Currently Present Has patient been a risk to self within the past 6 months prior to admission? : Yes Suicidal Intent: Yes-Currently Present Has patient had any suicidal intent within the past 6 months prior to admission? : Yes Is patient at risk for suicide?: Yes Suicidal Plan?: Yes-Currently Present Has patient had any suicidal plan within the past 6 months prior to admission? : Yes Specify Current Suicidal Plan: to jump off bridge or overdose on drugs/medication  Access to Means: Yes Specify Access to Suicidal Means: bridge, medications What has been your use of drugs/alcohol within the last 12 months?: Pt reports he has a hx of abusing alcohol with one year sober, but started drinking again 12 pack per day, uses THC, and hx of opioid and heroin abuse. Currently on suboxone. 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 : No Thoughts of Harm to Others: No Current Homicidal Intent: No Current Homicidal Plan: No Access to Homicidal Means: No Identified Victim: none History of harm to others?: No Assessment of Violence: None Noted Violent Behavior Description: none Does patient have access to weapons?: No Criminal Charges Pending?: No Does patient have a court date: No Is patient on probation?: No  Psychosis Hallucinations: None noted Delusions: None noted  Mental Status Report Appearance/Hygiene: Disheveled Eye Contact: Fair Motor Activity: Unremarkable Speech: Logical/coherent Level of Consciousness: Alert Mood: Depressed, Anxious Affect: Appropriate to circumstance Anxiety Level: Moderate Thought Processes: Coherent, Relevant Judgement: Impaired Orientation: Person, Place, Time, Situation Obsessive Compulsive Thoughts/Behaviors: None  Cognitive Functioning Concentration: Decreased Memory: Recent Intact, Remote Intact IQ:  Average Insight: Fair Impulse Control: Poor Appetite: Poor Weight Loss:  (not eating past few days) Weight Gain: 0 Sleep: Decreased Total Hours of Sleep:  ("not much" past few days ) Vegetative Symptoms: Not bathing, Decreased grooming  ADLScreening [facility]) Patient's cognitive ability adequate to safely complete daily activities?: Yes Patient able to express need for assistance with ADLs?: Yes Independently performs ADLs?: Yes (appropriate for developmental age)  Prior Inpatient Therapy Prior Inpatient Therapy: Yes Prior Therapy Dates: 2003, 2005, 08/2014 [name]  Reason for Treatment: SA, Bipolar, Cocaine abuse   Prior Outpatient Therapy Prior Outpatient Therapy: Yes Prior Therapy Dates: current Prior Therapy Facilty/Provider(s): [facility] -previously, Dr. Creta LevinStallings currently at [facility]  Reason for Treatment: SA, depression, anxiety  Does patient have an ACCT team?: No Does patient have Intensive In-House Services?  : No Does patient have [facility] services? : No Does patient have [facility] services?: No  ADL Screening (condition at time of admission) Patient's cognitive  ability adequate to safely complete daily activities?: Yes Is the patient deaf or have difficulty hearing?: No Does the patient have difficulty seeing, even when wearing glasses/contacts?: No Does the patient have difficulty concentrating, remembering, or making decisions?: Yes Patient able to express need for assistance with ADLs?: Yes Does the patient have difficulty dressing or bathing?: No Independently performs ADLs?: Yes (appropriate for developmental age) Does the patient have difficulty walking or climbing stairs?: No Weakness of Legs: None Weakness of Arms/Hands: None  Home Assistive Devices/Equipment Home Assistive Devices/Equipment: None    Abuse/Neglect Assessment (Assessment to be complete while patient is alone) Physical Abuse:  Denies Verbal Abuse: Denies Sexual Abuse: Denies Exploitation of patient/patient's resources: Denies Self-Neglect: Denies Values / Beliefs Cultural Requests During Hospitalization: None Spiritual Requests During Hospitalization: None   Advance Directives (For Healthcare) Does patient have an advance directive?: No Would patient like information on creating an advanced directive?: No - patient declined information    Additional Information 1:1 In Past 12 Months?: No CIRT Risk: No Elopement Risk: No Does patient have medical clearance?: Yes     Disposition:  Per Donell Sievert, PA pt meets inpt criteria and can be accepted to [facility] pending bed availability. Per [facility], there are currently no male 300 or 400 hall beds available. TTS to seek placement. Informed Pt, RN, and [employer].    Clista Bernhardt, [facility] Triage Specialist 04/27/2015 1:56 AM  Disposition Initial Assessment Completed for this Encounter: Yes  Royale Swamy M 04/27/2015 1:40 AM

## 2015-05-24 ENCOUNTER — Ambulatory Visit (INDEPENDENT_AMBULATORY_CARE_PROVIDER_SITE_OTHER): Payer: Medicare Other | Admitting: Internal Medicine

## 2015-05-24 ENCOUNTER — Encounter: Payer: Self-pay | Admitting: Internal Medicine

## 2015-05-24 VITALS — BP 124/81 | HR 94 | Temp 98.0°F | Ht 70.0 in | Wt 236.0 lb

## 2015-05-24 DIAGNOSIS — B182 Chronic viral hepatitis C: Secondary | ICD-10-CM

## 2015-05-24 DIAGNOSIS — K7460 Unspecified cirrhosis of liver: Secondary | ICD-10-CM

## 2015-05-24 DIAGNOSIS — F1024 Alcohol dependence with alcohol-induced mood disorder: Secondary | ICD-10-CM | POA: Diagnosis not present

## 2015-05-24 LAB — CBC WITH DIFFERENTIAL/PLATELET
BASOS ABS: 0 10*3/uL (ref 0.0–0.1)
BASOS PCT: 0 % (ref 0–1)
EOS ABS: 0.4 10*3/uL (ref 0.0–0.7)
Eosinophils Relative: 5 % (ref 0–5)
HEMATOCRIT: 43.7 % (ref 39.0–52.0)
Hemoglobin: 15.4 g/dL (ref 13.0–17.0)
LYMPHS ABS: 2.9 10*3/uL (ref 0.7–4.0)
LYMPHS PCT: 35 % (ref 12–46)
MCH: 31.6 pg (ref 26.0–34.0)
MCHC: 35.2 g/dL (ref 30.0–36.0)
MCV: 89.5 fL (ref 78.0–100.0)
MONO ABS: 0.7 10*3/uL (ref 0.1–1.0)
MONOS PCT: 9 % (ref 3–12)
MPV: 11.4 fL (ref 8.6–12.4)
NEUTROS PCT: 51 % (ref 43–77)
Neutro Abs: 4.2 10*3/uL (ref 1.7–7.7)
Platelets: 212 10*3/uL (ref 150–400)
RBC: 4.88 MIL/uL (ref 4.22–5.81)
RDW: 14.3 % (ref 11.5–15.5)
WBC: 8.3 10*3/uL (ref 4.0–10.5)

## 2015-05-24 LAB — COMPLETE METABOLIC PANEL WITH GFR
ALT: 17 U/L (ref 9–46)
AST: 21 U/L (ref 10–40)
Albumin: 3.7 g/dL (ref 3.6–5.1)
Alkaline Phosphatase: 77 U/L (ref 40–115)
BUN: 11 mg/dL (ref 7–25)
CO2: 27 mmol/L (ref 20–31)
Calcium: 9.4 mg/dL (ref 8.6–10.3)
Chloride: 105 mmol/L (ref 98–110)
Creat: 0.62 mg/dL (ref 0.60–1.35)
GFR, Est African American: >89 mL/min (ref >=60)
GFR, Est Non African American: >89 mL/min (ref >=60)
Glucose, Bld: 114 mg/dL — ABNORMAL HIGH (ref 65–99)
POTASSIUM: 4.1 mmol/L (ref 3.5–5.3)
Sodium: 139 mmol/L (ref 135–146)
TOTAL PROTEIN: 7.3 g/dL (ref 6.1–8.1)
Total Bilirubin: 0.5 mg/dL (ref 0.2–1.2)

## 2015-05-24 NOTE — Assessment & Plan Note (Signed)
Doing well on Harvoni. Will check his labs today and encouraged compliance.  Will return after treatment completion.

## 2015-05-24 NOTE — Progress Notes (Signed)
   Subjective:    Patient ID: [name] [name], male    DOB: Apr 29, 1976, 39 y.o.   MRN: [id number]  HPI He is here for follow-up of hepatitis C. 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 He was hepatitis A and B immune and underwent vaccination. He is F3/4 on elastography.  He also has a history of drug and alcohol abuse and now has been 8 months clean and has completed a program locally and continues to be in therapy twice a week. He did though relapse 'one day' earlier in July (ED note reviewed) but doing well since.  Has no complaints today. Now on Harvoni and on his second bottle.  Some headache relieved with acetaminophen.     Review of Systems  Constitutional: Negative for fatigue.  Gastrointestinal: Negative for nausea and diarrhea.  Skin: Negative for rash.  Neurological: Negative for dizziness and headaches.   SH: no further alcohol or drugs since July hospitalization.  Still in rehab program. + tobacco use.     Objective:   Physical Exam  Constitutional: He appears well-developed and well-nourished. No distress.  HENT:  (poor dentition  Eyes: No scleral icterus.  Cardiovascular: Normal rate, regular rhythm and normal heart sounds.   No murmur heard. Pulmonary/Chest: Effort normal and breath sounds normal. No respiratory distress.  Lymphadenopathy:    He has no cervical adenopathy.  Skin: No rash noted.          Assessment & Plan:

## 2015-05-24 NOTE — Assessment & Plan Note (Signed)
I discussed elastography results with him.  Will need HCC screen every 6 months.  Will have him see GI, do pneumovax next visit.

## 2015-05-24 NOTE — Assessment & Plan Note (Signed)
Encouraged continued abstinence. 

## 2015-05-25 LAB — HEPATITIS C RNA QUANTITATIVE: HCV QUANT: NOT DETECTED [IU]/mL (ref <15)

## 2015-07-25 ENCOUNTER — Emergency Department (HOSPITAL_COMMUNITY): Payer: Medicare Other

## 2015-07-25 ENCOUNTER — Encounter (HOSPITAL_COMMUNITY): Payer: Self-pay | Admitting: Emergency Medicine

## 2015-07-25 ENCOUNTER — Emergency Department (HOSPITAL_COMMUNITY)
Admission: EM | Admit: 2015-07-25 | Discharge: 2015-07-26 | Disposition: A | Discharge status: Other Acute Inpatient Hospital | Payer: Medicare Other | Attending: Emergency Medicine | Admitting: Emergency Medicine

## 2015-07-25 DIAGNOSIS — Z79899 Other long term (current) drug therapy: Secondary | ICD-10-CM | POA: Diagnosis not present

## 2015-07-25 DIAGNOSIS — Z880 Allergy status to penicillin: Secondary | ICD-10-CM | POA: Diagnosis not present

## 2015-07-25 DIAGNOSIS — R569 Unspecified convulsions: Secondary | ICD-10-CM | POA: Diagnosis not present

## 2015-07-25 DIAGNOSIS — Z872 Personal history of diseases of the skin and subcutaneous tissue: Secondary | ICD-10-CM | POA: Insufficient documentation

## 2015-07-25 DIAGNOSIS — Z8619 Personal history of other infectious and parasitic diseases: Secondary | ICD-10-CM | POA: Insufficient documentation

## 2015-07-25 DIAGNOSIS — F1410 Cocaine abuse, uncomplicated: Secondary | ICD-10-CM | POA: Insufficient documentation

## 2015-07-25 DIAGNOSIS — F1024 Alcohol dependence with alcohol-induced mood disorder: Secondary | ICD-10-CM | POA: Diagnosis present

## 2015-07-25 DIAGNOSIS — F319 Bipolar disorder, unspecified: Secondary | ICD-10-CM | POA: Insufficient documentation

## 2015-07-25 DIAGNOSIS — F1210 Cannabis abuse, uncomplicated: Secondary | ICD-10-CM | POA: Insufficient documentation

## 2015-07-25 DIAGNOSIS — F1010 Alcohol abuse, uncomplicated: Secondary | ICD-10-CM | POA: Diagnosis not present

## 2015-07-25 DIAGNOSIS — R45851 Suicidal ideations: Secondary | ICD-10-CM | POA: Diagnosis not present

## 2015-07-25 DIAGNOSIS — Z720 Tobacco use: Secondary | ICD-10-CM | POA: Insufficient documentation

## 2015-07-25 LAB — ETHANOL: Alcohol, Ethyl (B): <5 mg/dL

## 2015-07-25 LAB — COMPREHENSIVE METABOLIC PANEL WITH GFR
ALT: 16 U/L — ABNORMAL LOW (ref 17–63)
AST: 21 U/L (ref 15–41)
Albumin: 4.1 g/dL (ref 3.5–5.0)
Alkaline Phosphatase: 73 U/L (ref 38–126)
Anion gap: 5 (ref 5–15)
BUN: 14 mg/dL (ref 6–20)
CO2: 29 mmol/L (ref 22–32)
Calcium: 9.2 mg/dL (ref 8.9–10.3)
Chloride: 106 mmol/L (ref 101–111)
Creatinine, Ser: 0.71 mg/dL (ref 0.61–1.24)
GFR calc Af Amer: >60 mL/min
GFR calc non Af Amer: >60 mL/min
Glucose, Bld: 100 mg/dL — ABNORMAL HIGH (ref 65–99)
Potassium: 4 mmol/L (ref 3.5–5.1)
Sodium: 140 mmol/L (ref 135–145)
Total Bilirubin: 0.7 mg/dL (ref 0.3–1.2)
Total Protein: 7.7 g/dL (ref 6.5–8.1)

## 2015-07-25 LAB — ACETAMINOPHEN LEVEL: Acetaminophen (Tylenol), Serum: <10 ug/mL — ABNORMAL LOW (ref 10–30)

## 2015-07-25 LAB — SALICYLATE LEVEL: Salicylate Lvl: <4 mg/dL (ref 2.8–30.0)

## 2015-07-25 LAB — CBC
HCT: 40.6 % (ref 39.0–52.0)
Hemoglobin: 13.7 g/dL (ref 13.0–17.0)
MCH: 31.6 pg (ref 26.0–34.0)
MCHC: 33.7 g/dL (ref 30.0–36.0)
MCV: 93.5 fL (ref 78.0–100.0)
Platelets: 244 10*3/uL (ref 150–400)
RBC: 4.34 MIL/uL (ref 4.22–5.81)
RDW: 13.4 % (ref 11.5–15.5)
WBC: 15.3 10*3/uL — ABNORMAL HIGH (ref 4.0–10.5)

## 2015-07-25 MED ORDER — VITAMIN B-1 100 MG PO TABS
100.0000 mg | ORAL_TABLET | Freq: Every day | ORAL | Status: DC
  Administered 2015-07-26 (×2): 100 mg via ORAL
  Filled 2015-07-25 (×2): qty 1

## 2015-07-25 MED ORDER — THIAMINE HCL 100 MG/ML IJ SOLN: 100.0000 mg | Freq: Every day | INTRAMUSCULAR | Status: DC

## 2015-07-25 MED ORDER — LORAZEPAM 1 MG PO TABS
0.0000 mg | ORAL_TABLET | Freq: Two times a day (BID) | ORAL | Status: DC
  Administered 2015-07-26: 2 mg via ORAL
  Administered 2015-07-26: 1 mg via ORAL
  Administered 2015-07-26: 2 mg via ORAL
  Filled 2015-07-25: qty 1
  Filled 2015-07-25 (×2): qty 2

## 2015-07-25 MED ORDER — LORAZEPAM 1 MG PO TABS
0.0000 mg | ORAL_TABLET | Freq: Four times a day (QID) | ORAL | Status: DC
  Administered 2015-07-26 (×3): 1 mg via ORAL
  Filled 2015-07-25 (×3): qty 1

## 2015-07-25 MED ORDER — LORAZEPAM 2 MG/ML IJ SOLN: 0.0000 mg | Freq: Four times a day (QID) | INTRAMUSCULAR | Status: DC

## 2015-07-25 MED ORDER — LORAZEPAM 2 MG/ML IJ SOLN: 0.0000 mg | Freq: Two times a day (BID) | INTRAMUSCULAR | Status: DC

## 2015-07-25 NOTE — ED Notes (Signed)
Telepsych in progress. 

## 2015-07-25 NOTE — ED Notes (Signed)
Pt states he is feeling suicidal and was going to go to his brothers house this morning and get his gun and shoot himself  Pt states he has been off his psych meds for the past two weeks and has been feeling more depressed lately  Pt states he is an alcoholic and drinks a 12 pk of beer and a fifth of wine daily  Pt states his last drink was at 10am this morning   Pt states he had a seizure this morning and hit his right eye
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
## 2015-07-26 ENCOUNTER — Encounter (HOSPITAL_COMMUNITY): Payer: Self-pay | Admitting: *Deleted

## 2015-07-26 ENCOUNTER — Ambulatory Visit: Payer: Self-pay | Admitting: Internal Medicine

## 2015-07-26 DIAGNOSIS — F1024 Alcohol dependence with alcohol-induced mood disorder: Secondary | ICD-10-CM | POA: Diagnosis not present

## 2015-07-26 DIAGNOSIS — R45851 Suicidal ideations: Secondary | ICD-10-CM

## 2015-07-26 DIAGNOSIS — F319 Bipolar disorder, unspecified: Secondary | ICD-10-CM | POA: Diagnosis not present

## 2015-07-26 DIAGNOSIS — F1010 Alcohol abuse, uncomplicated: Secondary | ICD-10-CM | POA: Insufficient documentation

## 2015-07-26 LAB — RAPID URINE DRUG SCREEN, HOSP PERFORMED
AMPHETAMINES: NOT DETECTED
BARBITURATES: NOT DETECTED
BENZODIAZEPINES: NOT DETECTED
Cocaine: POSITIVE — AB
Opiates: NOT DETECTED
TETRAHYDROCANNABINOL: POSITIVE — AB

## 2015-07-26 MED ORDER — ONDANSETRON 4 MG PO TBDP
4.0000 mg | ORAL_TABLET | Freq: Once | ORAL | Status: AC
  Administered 2015-07-26: 4 mg via ORAL
  Filled 2015-07-26: qty 1

## 2015-07-26 MED ORDER — GABAPENTIN 300 MG PO CAPS
300.0000 mg | ORAL_CAPSULE | Freq: Three times a day (TID) | ORAL | Status: DC
  Administered 2015-07-26 (×3): 300 mg via ORAL
  Filled 2015-07-26 (×3): qty 1

## 2015-07-26 MED ORDER — SERTRALINE HCL 50 MG PO TABS
100.0000 mg | ORAL_TABLET | Freq: Every day | ORAL | Status: DC
  Administered 2015-07-26: 100 mg via ORAL
  Filled 2015-07-26: qty 2

## 2015-07-26 MED ORDER — TRAZODONE HCL 50 MG PO TABS: 50.0000 mg | ORAL_TABLET | Freq: Every day | ORAL | Status: DC

## 2015-07-26 MED ORDER — BUPROPION HCL ER (SR) 150 MG PO TB12
450.0000 mg | ORAL_TABLET | Freq: Every day | ORAL | Status: DC
  Administered 2015-07-26: 450 mg via ORAL
  Filled 2015-07-26: qty 3

## 2015-07-26 MED ORDER — NICOTINE 21 MG/24HR TD PT24
21.0000 mg | MEDICATED_PATCH | Freq: Every day | TRANSDERMAL | Status: DC
  Administered 2015-07-26: 21 mg via TRANSDERMAL
  Filled 2015-07-26: qty 1

## 2015-07-26 MED ORDER — PANTOPRAZOLE SODIUM 40 MG PO TBEC
40.0000 mg | DELAYED_RELEASE_TABLET | Freq: Every day | ORAL | Status: DC
  Administered 2015-07-26: 40 mg via ORAL
  Filled 2015-07-26: qty 1

## 2015-07-26 NOTE — ED Notes (Signed)
Pt transported to [facility]  by [facility] for continuation of specialized care. Pt left in no acute distress. Belongings signed for and given to [employer] officer. Pt left in no acute distress. 

## 2015-07-26 NOTE — ED Notes (Signed)
Augusto Gamble at [facility] reported no ETA at this time, stated: "[employer] waiting on cars to clear.
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
## 2015-07-26 NOTE — BH Assessment (Signed)
[facility] Assessment Progress Note  Per Carolanne Grumbling, MD, this pt requires psychiatric hospitalization at this time.  At 14:52 Albin Felling calls from [facility] to report that Dr. Jeannine Kitten has agreed to accept pt to their facility provided pt is placed under IVC.  EDP Azalia Bilis, MD has initiated IVC, and petition has been faxed to [employer] who acknowledges receipt at 16:01.  Dahlia Byes, NP, agrees with this disposition.  Please call report to [phone].  Pt's nurse, Dawnaly, has been notified.  Doylene Canning, MA Triage Specialist [phone]

## 2015-07-26 NOTE — ED Provider Notes (Signed)
CSN: [id number]     Arrival date & time 07/25/15  2029 History   First MD Initiated Contact with Patient 07/25/15 2107     Chief Complaint  Patient presents with  . Suicidal     The history is provided by the patient. No language interpreter was used.   [name] [name] presents for evaluation of suicidal ideations. He reports ongoing suicidal ideation and he wants to shoot himself with a gun and has access to one at his brother's house. He has a history of EtOH abuse and drinks regularly. He states he had a seizure this morning and hit his right eye. He has had alcohol since that time. He has a history of having seizures when he does not drink alcohol. He has continued as sign.  Past Medical History  Diagnosis Date  . Bipolar disorder ([facility])   . Hepatitis C   . Depression   . Cellulitis    Past Surgical History  Procedure Laterality Date  . Back surgery    . Shoulder surgery    . Rotator cuff repair     Family History  Problem Relation Age of Onset  . Alcohol abuse Father   . Cancer Maternal Aunt    Social History  Substance Use Topics  . Smoking status: Current Every Day Smoker -- 1.00 packs/day    Types: Cigarettes    Start date: 10/22/1981  . Smokeless tobacco: Never Used  . Alcohol Use: 7.2 oz/week    12 Cans of beer per week     Comment: heavy    Review of Systems  All other systems reviewed and are negative.     Allergies  Lithium and Penicillins  Home Medications   Prior to Admission medications   Medication Sig Start Date End Date Taking? Authorizing Provider  buPROPion (WELLBUTRIN SR) 150 MG 12 hr tablet Take 450 mg by mouth daily.   Yes Historical Provider, MD  gabapentin (NEURONTIN) 800 MG tablet Take 800 mg by mouth 3 (three) times daily.  07/01/15  Yes Historical Provider, MD  sertraline (ZOLOFT) 100 MG tablet Take 1 tablet (100 mg total) by mouth daily. 09/14/14  Yes Adonis Brook, NP  SUBOXONE 8-2 MG FILM Place 1 Film under the tongue 2 (two) times  daily. 05/18/15  Yes Historical Provider, MD  traZODone (DESYREL) 100 MG tablet Take 100 mg by mouth at bedtime as needed for sleep (sleep).    Yes Historical Provider, MD  gabapentin (NEURONTIN) 400 MG capsule Take 2 capsules (800 mg total) by mouth 4 (four) times daily. Patient not taking: Reported on 07/25/2015 09/13/14   Adonis Brook, NP  Ledipasvir-Sofosbuvir (HARVONI) 90-400 MG TABS Take 1 tablet by mouth daily. Patient not taking: Reported on 07/25/2015 03/31/15   Gardiner Barefoot, MD  pantoprazole (PROTONIX) 40 MG tablet Take 1 tablet (40 mg total) by mouth daily at 6 (six) AM. Patient not taking: Reported on 05/24/2015 10/23/14   Rodolph Bong, MD   BP 110/51 mmHg  Pulse 59  Temp(Src) 97.6 F (36.4 C) (Oral)  Resp 20  SpO2 100% Physical Exam  Constitutional: He appears well-developed and well-nourished.  HENT:  Head: Normocephalic.  Right periorbital ecchymoses  Eyes: EOM are normal.  Cardiovascular: Normal rate.   No murmur heard. Pulmonary/Chest: Effort normal. No respiratory distress.  Musculoskeletal: Normal range of motion.  Neurological: He is alert.  Skin: Skin is warm and dry.  Psychiatric:  Flat affect  Nursing note and vitals reviewed.   ED Course  Procedures (including critical care time) Labs Review Labs Reviewed  COMPREHENSIVE METABOLIC PANEL - Abnormal; Notable for the following:    Glucose, Bld 100 (*)    ALT 16 (*)    All other components within normal limits  ACETAMINOPHEN LEVEL - Abnormal; Notable for the following:    Acetaminophen (Tylenol), Serum <10 (*)    All other components within normal limits  CBC - Abnormal; Notable for the following:    WBC 15.3 (*)    All other components within normal limits  ETHANOL  SALICYLATE LEVEL  URINE RAPID DRUG SCREEN, HOSP PERFORMED    Imaging Review Ct Head Wo Contrast  07/25/2015   CLINICAL DATA:  Suicidal ideation. Status post seizure; hit right orbit. Initial encounter.  EXAM: CT HEAD WITHOUT  CONTRAST  TECHNIQUE: Contiguous axial images were obtained from the base of the skull through the vertex without intravenous contrast.  COMPARISON:  None.  FINDINGS: There is no evidence of acute infarction, mass lesion, or intra- or extra-axial hemorrhage on CT.  The posterior fossa, including the cerebellum, brainstem and fourth ventricle, is within normal limits. The third and lateral ventricles, and basal ganglia are unremarkable in appearance. The cerebral hemispheres are symmetric in appearance, with normal gray-white differentiation. No mass effect or midline shift is seen.  There is no evidence of fracture; visualized osseous structures are unremarkable in appearance. The orbits are within normal limits. There is partial opacification of the maxillary sinuses bilaterally, and at the ethmoid air cells. There is mucosal thickening at the sphenoid sinus. The remaining paranasal sinuses and mastoid air cells are well-aerated. No significant soft tissue abnormalities are seen. A small lipoma is noted anterior to the left frontal calvarium.  IMPRESSION: 1. No evidence of traumatic intracranial injury or fracture. 2. Partial opacification of the maxillary sinuses bilaterally, and mucosal thickening at the sphenoid sinus.   Electronically Signed   By: Roanna Raider M.D.   On: 07/25/2015 22:57   I have personally reviewed and evaluated these images and lab results as part of my medical decision-making.   EKG Interpretation None      MDM   Final diagnoses:  None   patient with history of EtOH abuse here with SI. He states he had an alcohol withdrawal seizure prior to ED arrival. Patient does not appear to be in active withdrawal symptoms and department. He was placed on serial protocol. No evidence of intracranial injury. Patient has been medically cleared for psychiatric evaluation.  Tilden Fossa, MD 07/26/15 [phone]

## 2015-07-26 NOTE — ED Notes (Signed)
Presents with complaint of SI, pt states he will shoot himself, denies HI or AV hallucinations.  Pt states he overdosed on pills as a teenager.  Feeling hopeless.  Diagnosed with Depression, Bipolar DO in the past.  Noncompliant with meds x 1 week.  Pt reports he had a seizure this am, injuring his right eye,  No seizure activity noted at present.  Pt states his life is falling apart related to financial stressors.  Admits to drinking a 12 pack and a fifth per day, buying benzo's off the street.  Monitoring for safety, Q 15 min checks in effect.

## 2015-07-26 NOTE — ED Notes (Signed)
Attempted to call report to [facility] to call back in 30 min that staff was still in shift report.

## 2015-07-26 NOTE — ED Notes (Signed)
Patient complains of nausea no vomiting at this time. Respirations equal and unlabored, skin warm and dry. NAD. Dr. Rubin Payor contacted and verbal orders given. Patient will be medicated per MAR. Safety maintain and Q 15 min safety checks remain in place.

## 2015-07-26 NOTE — ED Notes (Addendum)
Patient admits to SI and HI with a plan to shoot himself and his brother with a shot gun, but denies AVH at this time. Plan of care discussed with patient. Patient voice no complaints or concerns at this time. Encouragement and support provided and safety maintain. Q 15 min safety checks remain in place.

## 2015-07-30 DIAGNOSIS — F39 Unspecified mood [affective] disorder: Secondary | ICD-10-CM | POA: Insufficient documentation

## 2015-11-16 IMAGING — CT CT HEAD W/O CM
2 series · 16 of 30 positions shown, 19 images · non-contrast
Comparison: None.

CLINICAL DATA: Suicidal ideation. Status post seizure; hit right
orbit. Initial encounter.

EXAM:
CT HEAD WITHOUT CONTRAST
TECHNIQUE: Contiguous axial images were obtained from the base of the skull
through the vertex without intravenous contrast.

[Series 2: head w/o · axial · non-contrast · 0.45mm/px · z∈[+1325,+1455]mm · 9 of 34 slices shown, 12 images]
[im 4/34  brain]
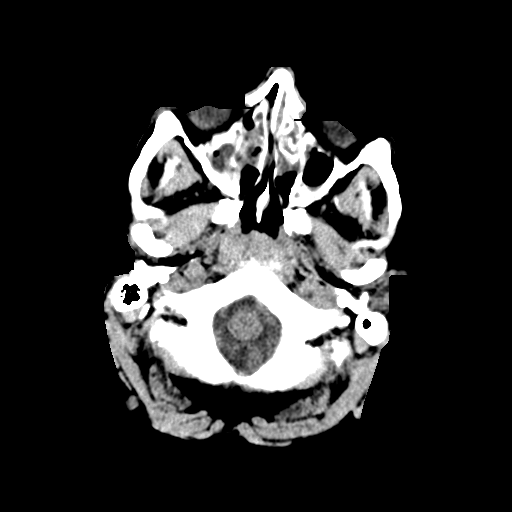
[im 4/34  bone]
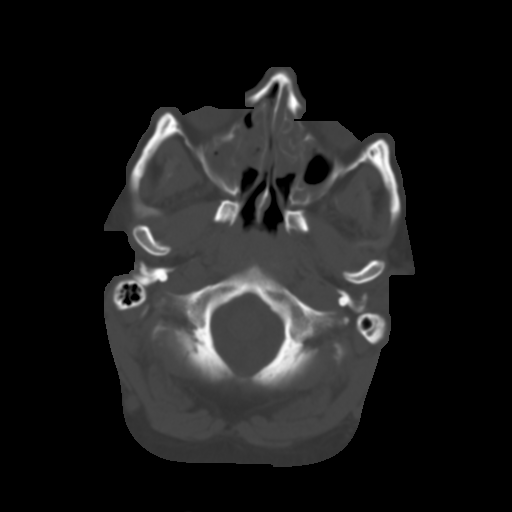
[im 7/34  brain]
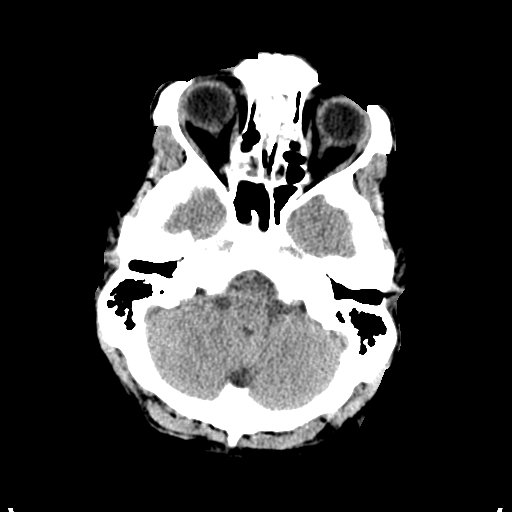
[im 10/34  brain]
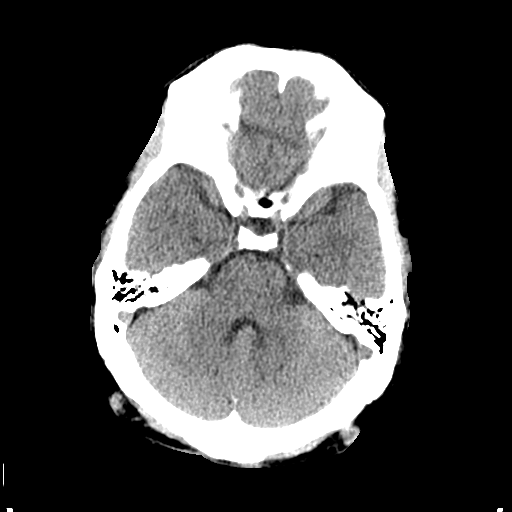
[im 14/34  brain]
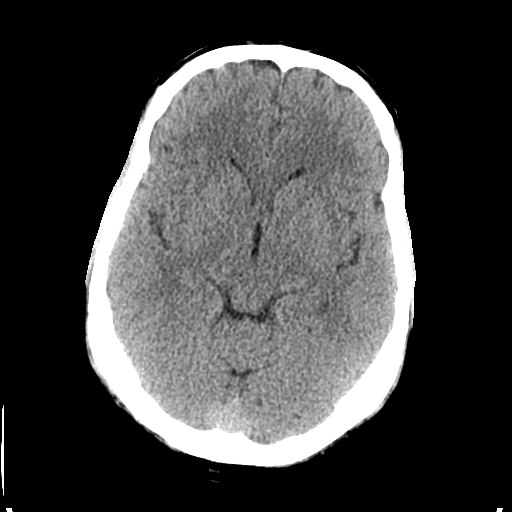
[im 17/34  brain]
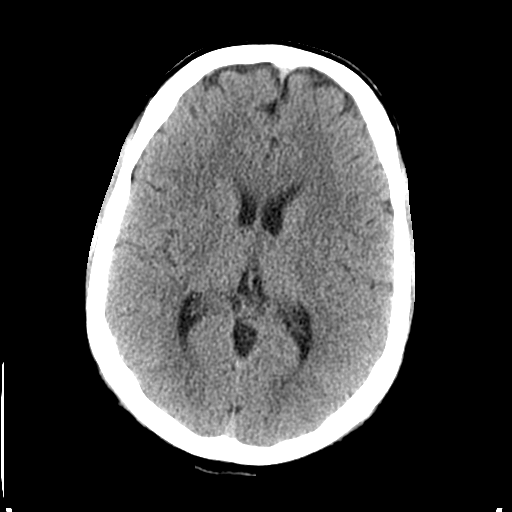
[im 17/34  bone]
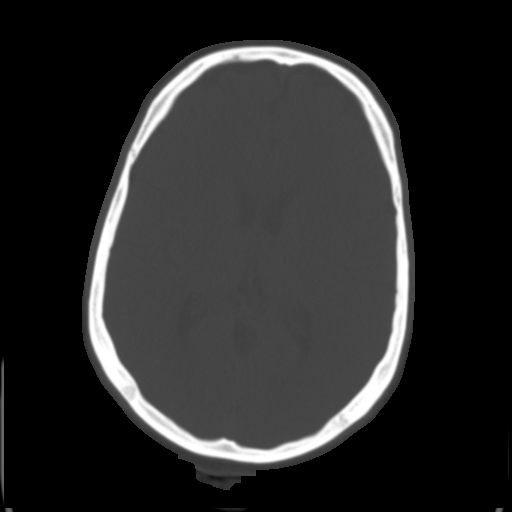
[im 20/34  brain]
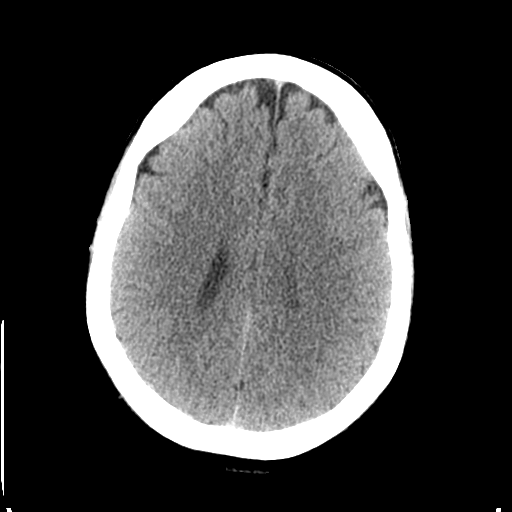
[im 24/34  brain]
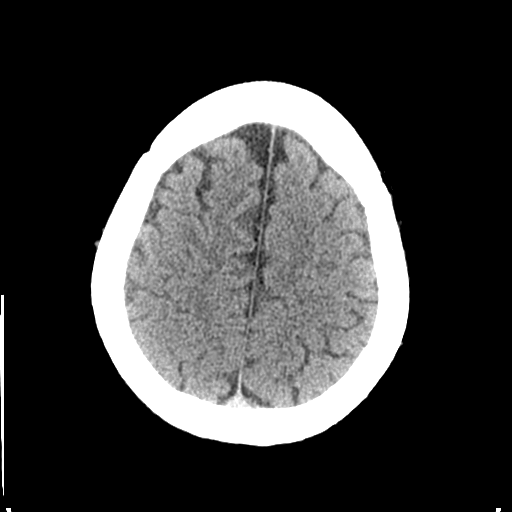
[im 27/34  brain]
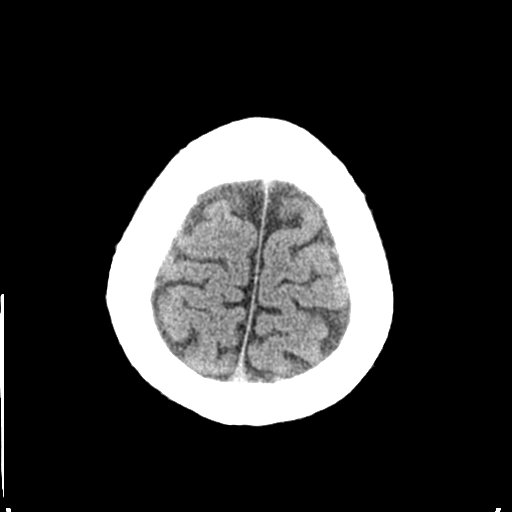
[im 30/34  brain]
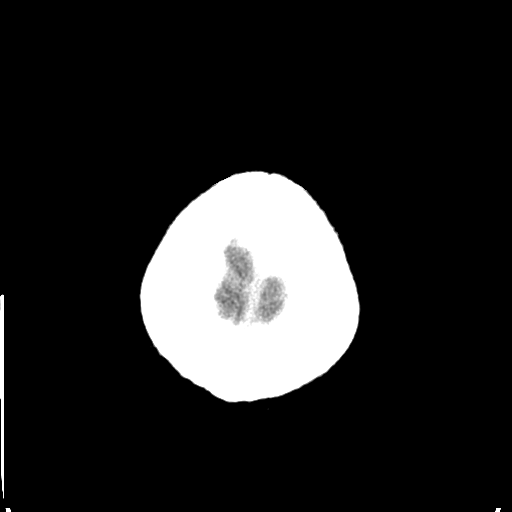
[im 30/34  bone]
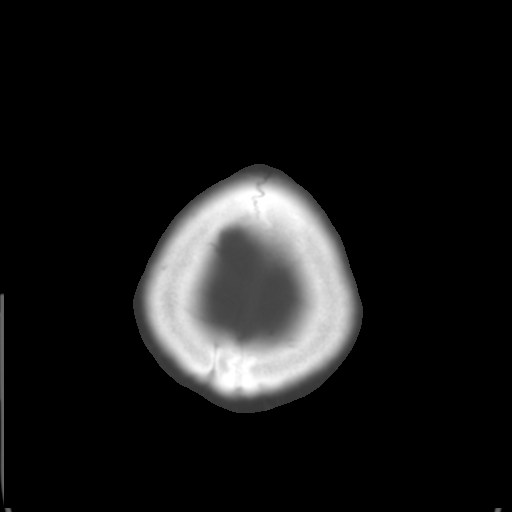

[Series 3: bone windows · axial · 0.45mm/px · z∈[+1328,+1439]mm · 7 of 57 slices shown]
[im 7/57  bone]
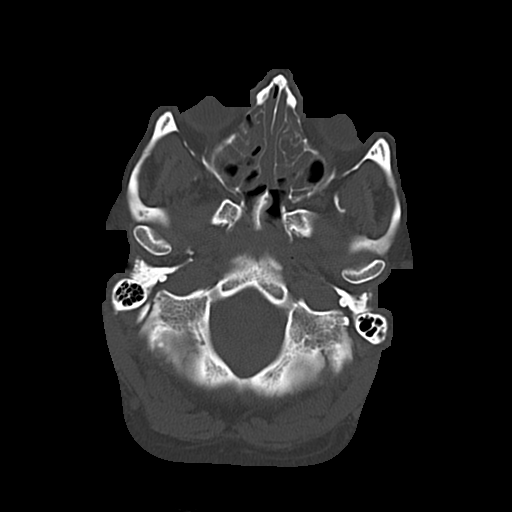
[im 13/57  bone]
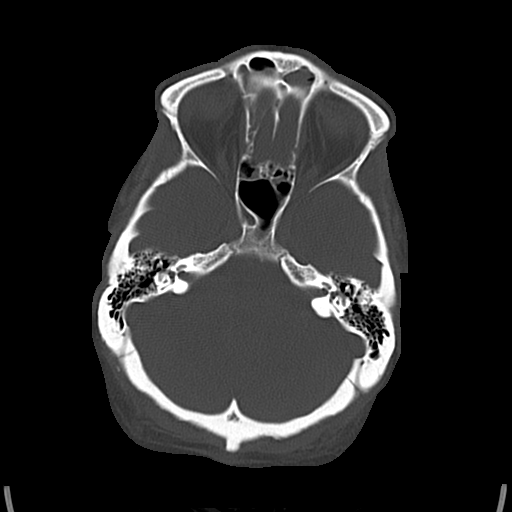
[im 19/57  bone]
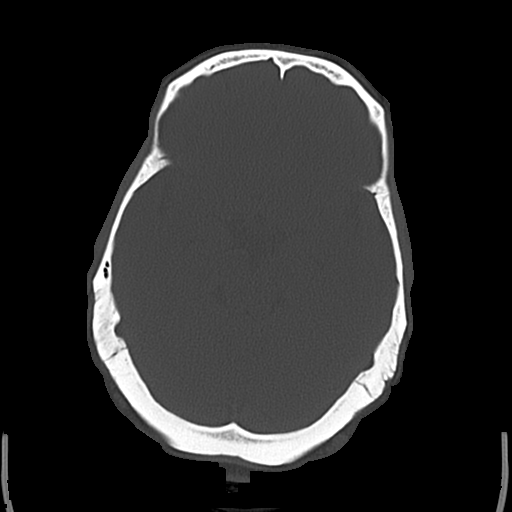
[im 25/57  bone]
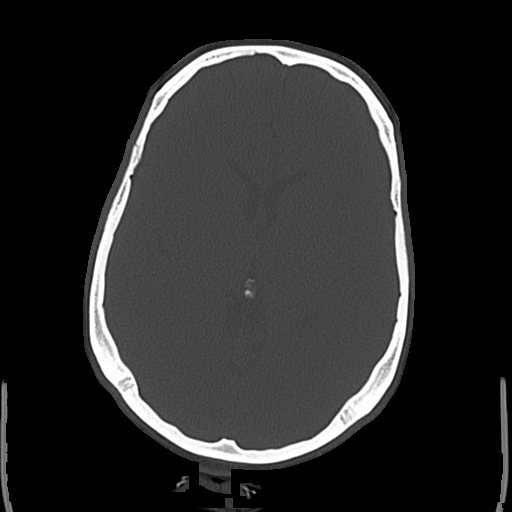
[im 32/57  bone]
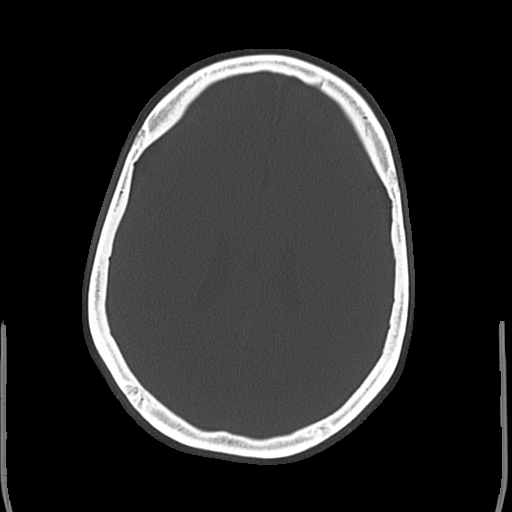
[im 38/57  bone]
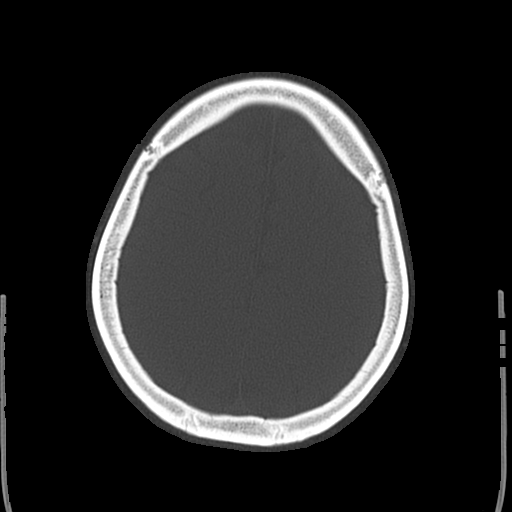
[im 44/57  bone]
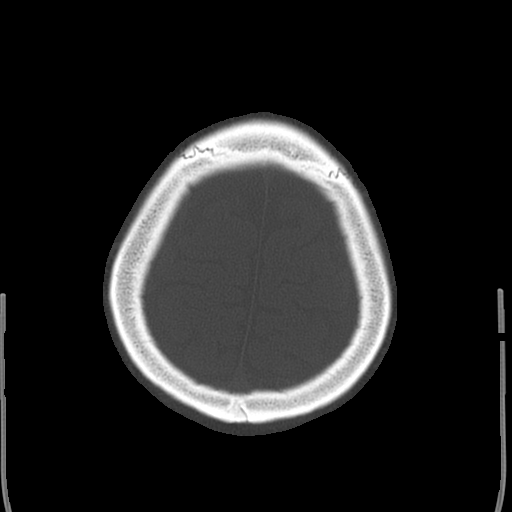

[16 of 30 positions shown; findings below may reference images not displayed]

FINDINGS: There is no evidence of acute infarction, mass lesion, or intra- or
extra-axial hemorrhage on CT.

The posterior fossa, including the cerebellum, brainstem and fourth
ventricle, is within normal limits. The third and lateral
ventricles, and basal ganglia are unremarkable in appearance. The
cerebral hemispheres are symmetric in appearance, with normal
gray-white differentiation. No mass effect or midline shift is seen.

There is no evidence of fracture; visualized osseous structures are
unremarkable in appearance. The orbits are within normal limits.
There is partial opacification of the maxillary sinuses bilaterally,
and at the ethmoid air cells. There is mucosal thickening at the
sphenoid sinus. The remaining paranasal sinuses and mastoid air
cells are well-aerated. No significant soft tissue abnormalities are
seen. A small lipoma is noted anterior to the left frontal
calvarium.
IMPRESSION: 1. No evidence of traumatic intracranial injury or fracture.
2. Partial opacification of the maxillary sinuses bilaterally, and
mucosal thickening at the sphenoid sinus.

## 2016-01-02 ENCOUNTER — Inpatient Hospital Stay: Payer: Medicare Other | Admitting: Internal Medicine

## 2016-11-27 ENCOUNTER — Encounter (HOSPITAL_COMMUNITY): Payer: Self-pay | Admitting: Emergency Medicine

## 2016-11-27 ENCOUNTER — Emergency Department (HOSPITAL_COMMUNITY)
Admission: EM | Admit: 2016-11-27 | Discharge: 2016-11-28 | Disposition: A | Discharge status: Other Acute Inpatient Hospital | Payer: Medicare Other | Attending: Emergency Medicine | Admitting: Emergency Medicine

## 2016-11-27 ENCOUNTER — Emergency Department (HOSPITAL_COMMUNITY)
Admission: EM | Admit: 2016-11-27 | Discharge: 2016-11-27 | Discharge status: Absent without leave | Payer: Medicare Other

## 2016-11-27 DIAGNOSIS — F11288 Opioid dependence with other opioid-induced disorder: Secondary | ICD-10-CM | POA: Diagnosis not present

## 2016-11-27 DIAGNOSIS — F17210 Nicotine dependence, cigarettes, uncomplicated: Secondary | ICD-10-CM | POA: Insufficient documentation

## 2016-11-27 DIAGNOSIS — R45851 Suicidal ideations: Secondary | ICD-10-CM | POA: Diagnosis present

## 2016-11-27 DIAGNOSIS — F332 Major depressive disorder, recurrent severe without psychotic features: Secondary | ICD-10-CM | POA: Diagnosis not present

## 2016-11-27 DIAGNOSIS — F1920 Other psychoactive substance dependence, uncomplicated: Secondary | ICD-10-CM | POA: Diagnosis present

## 2016-11-27 DIAGNOSIS — F1910 Other psychoactive substance abuse, uncomplicated: Secondary | ICD-10-CM

## 2016-11-27 DIAGNOSIS — F1010 Alcohol abuse, uncomplicated: Secondary | ICD-10-CM | POA: Diagnosis not present

## 2016-11-27 DIAGNOSIS — Z79899 Other long term (current) drug therapy: Secondary | ICD-10-CM | POA: Insufficient documentation

## 2016-11-27 LAB — COMPREHENSIVE METABOLIC PANEL
ALT: 26 U/L (ref 17–63)
AST: 24 U/L (ref 15–41)
Albumin: 4.3 g/dL (ref 3.5–5.0)
Alkaline Phosphatase: 68 U/L (ref 38–126)
Anion gap: 10 (ref 5–15)
BUN: 10 mg/dL (ref 6–20)
CALCIUM: 9.2 mg/dL (ref 8.9–10.3)
CO2: 26 mmol/L (ref 22–32)
CREATININE: 0.88 mg/dL (ref 0.61–1.24)
Chloride: 106 mmol/L (ref 101–111)
GFR calc non Af Amer: >60 mL/min (ref >60)
Glucose, Bld: 92 mg/dL (ref 65–99)
Potassium: 4 mmol/L (ref 3.5–5.1)
Sodium: 142 mmol/L (ref 135–145)
Total Bilirubin: 0.5 mg/dL (ref 0.3–1.2)
Total Protein: 7.7 g/dL (ref 6.5–8.1)

## 2016-11-27 LAB — CBC
HCT: 43.5 % (ref 39.0–52.0)
HEMOGLOBIN: 15.1 g/dL (ref 13.0–17.0)
MCH: 31 pg (ref 26.0–34.0)
MCHC: 34.7 g/dL (ref 30.0–36.0)
MCV: 89.3 fL (ref 78.0–100.0)
Platelets: 312 10*3/uL (ref 150–400)
RBC: 4.87 MIL/uL (ref 4.22–5.81)
RDW: 13 % (ref 11.5–15.5)
WBC: 10.8 10*3/uL — ABNORMAL HIGH (ref 4.0–10.5)

## 2016-11-27 LAB — ETHANOL: Alcohol, Ethyl (B): 60 mg/dL — ABNORMAL HIGH (ref <5)

## 2016-11-27 LAB — RAPID URINE DRUG SCREEN, HOSP PERFORMED
Amphetamines: NOT DETECTED
Barbiturates: NOT DETECTED
Benzodiazepines: POSITIVE — AB
Cocaine: POSITIVE — AB
OPIATES: NOT DETECTED
TETRAHYDROCANNABINOL: POSITIVE — AB

## 2016-11-27 LAB — SALICYLATE LEVEL

## 2016-11-27 LAB — ACETAMINOPHEN LEVEL: Acetaminophen (Tylenol), Serum: <10 ug/mL — ABNORMAL LOW (ref 10–30)

## 2016-11-27 MED ORDER — GABAPENTIN 400 MG PO CAPS
800.0000 mg | ORAL_CAPSULE | Freq: Four times a day (QID) | ORAL | Status: DC
  Administered 2016-11-28 (×2): 800 mg via ORAL
  Filled 2016-11-27 (×2): qty 2

## 2016-11-27 MED ORDER — LEDIPASVIR-SOFOSBUVIR 90-400 MG PO TABS: 1.0000 | ORAL_TABLET | Freq: Every day | ORAL | Status: DC

## 2016-11-27 MED ORDER — ZOLPIDEM TARTRATE 5 MG PO TABS
5.0000 mg | ORAL_TABLET | Freq: Every evening | ORAL | Status: DC | PRN
  Administered 2016-11-28: 5 mg via ORAL
  Filled 2016-11-27: qty 1

## 2016-11-27 MED ORDER — ALUM & MAG HYDROXIDE-SIMETH 200-200-20 MG/5ML PO SUSP: 30.0000 mL | ORAL | Status: DC | PRN

## 2016-11-27 MED ORDER — LORAZEPAM 1 MG PO TABS
1.0000 mg | ORAL_TABLET | Freq: Three times a day (TID) | ORAL | Status: DC | PRN
  Administered 2016-11-28: 1 mg via ORAL
  Filled 2016-11-27: qty 1

## 2016-11-27 MED ORDER — ACETAMINOPHEN 325 MG PO TABS: 650.0000 mg | ORAL_TABLET | ORAL | Status: DC | PRN

## 2016-11-27 MED ORDER — IBUPROFEN 200 MG PO TABS
600.0000 mg | ORAL_TABLET | Freq: Three times a day (TID) | ORAL | Status: DC | PRN
  Administered 2016-11-28: 600 mg via ORAL
  Filled 2016-11-27: qty 3

## 2016-11-27 MED ORDER — NICOTINE 21 MG/24HR TD PT24
21.0000 mg | MEDICATED_PATCH | Freq: Every day | TRANSDERMAL | Status: DC
  Administered 2016-11-28: 21 mg via TRANSDERMAL
  Filled 2016-11-27: qty 1

## 2016-11-27 MED ORDER — LORAZEPAM 1 MG PO TABS
0.0000 mg | ORAL_TABLET | Freq: Four times a day (QID) | ORAL | Status: DC
  Administered 2016-11-28 (×2): 1 mg via ORAL
  Filled 2016-11-27 (×2): qty 1

## 2016-11-27 MED ORDER — PANTOPRAZOLE SODIUM 40 MG PO TBEC
40.0000 mg | DELAYED_RELEASE_TABLET | Freq: Every day | ORAL | Status: DC
  Administered 2016-11-28: 40 mg via ORAL
  Filled 2016-11-27: qty 1

## 2016-11-27 MED ORDER — SERTRALINE HCL 50 MG PO TABS
100.0000 mg | ORAL_TABLET | Freq: Every day | ORAL | Status: DC
  Administered 2016-11-28: 100 mg via ORAL
  Filled 2016-11-27: qty 2

## 2016-11-27 MED ORDER — LORAZEPAM 1 MG PO TABS: 0.0000 mg | ORAL_TABLET | Freq: Two times a day (BID) | ORAL | Status: DC

## 2016-11-27 MED ORDER — ONDANSETRON HCL 4 MG PO TABS
4.0000 mg | ORAL_TABLET | Freq: Three times a day (TID) | ORAL | Status: DC | PRN
  Administered 2016-11-28: 4 mg via ORAL
  Filled 2016-11-27: qty 1

## 2016-11-27 MED ORDER — VITAMIN B-1 100 MG PO TABS
100.0000 mg | ORAL_TABLET | Freq: Every day | ORAL | Status: DC
  Administered 2016-11-28: 100 mg via ORAL
  Filled 2016-11-27: qty 1

## 2016-11-27 MED ORDER — THIAMINE HCL 100 MG/ML IJ SOLN: 100.0000 mg | Freq: Every day | INTRAMUSCULAR | Status: DC

## 2016-11-27 NOTE — ED Notes (Signed)
Patient name called to lobby x3 without response.

## 2016-11-27 NOTE — ED Triage Notes (Signed)
Patient is stating he wants to kill himself. Patient states he wants to kill himself by shooting himself. Patient states he has been drinking heavily. Patient has a bottle of Neurontin.

## 2016-11-27 NOTE — ED Notes (Signed)
Patient name called to lobby x2 with no response.

## 2016-11-27 NOTE — ED Provider Notes (Signed)
[facility]-EMERGENCY DEPT Provider Note   CSN: [id number] Arrival date & time: 11/27/16  2009     History   Chief Complaint Chief Complaint  Patient presents with  . Suicidal    HPI [name] [name] is a 41 y.o. male.  The history is provided by the patient and medical records.    41 year old male with history of bipolar disorder, depression, hepatitis C, presenting to the ED for suicidal ideation. Patient reports recently his drug and alcohol abuse has gotten "out of control". He estimates he is drinking a fifth of liquor daily and abusing heroin, marijuana, and opiates.  States his last drink was earlier today around noon. States he doesn't want to live like this anymore and has had thoughts of "blowing his brain out". States he does have a pistol at home that he would used to do this with. He has not tried to harm himself recently. He denies any homicidal ideation. No hallucinations. Patient states he has gone through detox program before and has suffered from withdrawal seizures from alcohol.  Past Medical History:  Diagnosis Date  . Bipolar disorder (HCC)   . Cellulitis   . Depression   . Hepatitis C     Patient Active Problem List   Diagnosis Date Noted  . ETOH abuse   . Hepatic cirrhosis (HCC) 05/24/2015  . Substance induced mood disorder (HCC) 04/27/2015  . Suicidal ideation   . Recurrent cellulitis of lower leg 11/09/2014  . Venous (peripheral) insufficiency 11/09/2014  . Bipolar affective disorder (HCC)   . Alcohol dependence with alcohol-induced mood disorder (HCC)   . Opioid dependence with opioid-induced mood disorder (HCC)   . 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 MDD (major depressive disorder) 09/09/2014  . Suicidal ideations   . Chronic pain syndrome 05/31/2014  . Tobacco dependence 05/30/2014  . Poor dentition 02/03/2014  . Hepatitis C virus infection without hepatic coma 02/03/2014  . Sepsis (HCC) 01/29/2014    . Anxiety state, unspecified 01/31/2013  . Mood disorder in conditions classified elsewhere 01/31/2013  . Polysubstance (including opioids) dependence with physiological dependence (HCC) 01/27/2013    Class: Acute  . Alcohol withdrawal (HCC) 01/27/2013    Class: Acute    Past Surgical History:  Procedure Laterality Date  . BACK SURGERY    . ROTATOR CUFF REPAIR    . SHOULDER SURGERY         Home Medications    Prior to Admission medications   Medication Sig Start Date End Date Taking? Authorizing Provider  gabapentin (NEURONTIN) 400 MG capsule Take 2 capsules (800 mg total) by mouth 4 (four) times daily. 09/13/14  Yes Adonis Brook, NP  Ledipasvir-Sofosbuvir (HARVONI) 90-400 MG TABS Take 1 tablet by mouth daily. Patient not taking: Reported on 07/25/2015 03/31/15   Gardiner Barefoot, MD  pantoprazole (PROTONIX) 40 MG tablet Take 1 tablet (40 mg total) by mouth daily at 6 (six) AM. Patient not taking: Reported on 05/24/2015 10/23/14   Rodolph Bong, MD  sertraline (ZOLOFT) 100 MG tablet Take 1 tablet (100 mg total) by mouth daily. Patient not taking: Reported on 11/27/2016 09/14/14   Adonis Brook, NP    Family History Family History  Problem Relation Age of Onset  . Alcohol abuse Father   . Cancer Maternal Aunt     Social History Social History  Substance Use Topics  . Smoking status: Current Every Day Smoker    Packs/day: 1.00    Types: Cigarettes  Start date: 10/22/1981  . Smokeless tobacco: Never Used  . Alcohol use 7.2 oz/week    12 Cans of beer per week     Comment: heavy     Allergies   Lithium and Penicillins   Review of Systems Review of Systems  Psychiatric/Behavioral: Positive for suicidal ideas.  All other systems reviewed and are negative.    Physical Exam Updated Vital Signs BP 141/84 (BP Location: Left Arm)   Pulse 91   Temp 98.4 F (36.9 C)   Resp 20   SpO2 93%   Physical Exam  Constitutional: He is oriented to person, place, and  time. He appears well-developed and well-nourished.  Sleeping in room prior to entering, had to be awoken for exam, smells of alcohol  HENT:  Head: Normocephalic and atraumatic.  Mouth/Throat: Oropharynx is clear and moist.  Eyes: Conjunctivae and EOM are normal. Pupils are equal, round, and reactive to light.  Neck: Normal range of motion.  Cardiovascular: Normal rate, regular rhythm and normal heart sounds.   Pulmonary/Chest: Effort normal and breath sounds normal.  Abdominal: Soft. Bowel sounds are normal.  Musculoskeletal: Normal range of motion.  Neurological: He is alert and oriented to person, place, and time.  Alert, oriented to baseline, moving arms and legs without noted ataxia, no apparent tremors, speech is mildly slurred  Skin: Skin is warm and dry.  Psychiatric: He has a normal mood and affect.  Nursing note and vitals reviewed.    ED Treatments / Results  Labs (all labs ordered are listed, but only abnormal results are displayed) Labs Reviewed  ETHANOL - Abnormal; Notable for the following:       Result Value   Alcohol, Ethyl (B) 60 (*)    All other components within normal limits  ACETAMINOPHEN LEVEL - Abnormal; Notable for the following:    Acetaminophen (Tylenol), Serum <10 (*)    All other components within normal limits  CBC - Abnormal; Notable for the following:    WBC 10.8 (*)    All other components within normal limits  RAPID URINE DRUG SCREEN, HOSP PERFORMED - Abnormal; Notable for the following:    Cocaine POSITIVE (*)    Benzodiazepines POSITIVE (*)    Tetrahydrocannabinol POSITIVE (*)    All other components within normal limits  COMPREHENSIVE METABOLIC PANEL  SALICYLATE LEVEL    EKG  EKG Interpretation None       Radiology No results found.  Procedures Procedures (including critical care time)  Medications Ordered in ED Medications  LORazepam (ATIVAN) tablet 0-4 mg (not administered)    Followed by  LORazepam (ATIVAN) tablet 0-4  mg (not administered)  thiamine (VITAMIN B-1) tablet 100 mg (not administered)    Or  thiamine (B-1) injection 100 mg (not administered)  alum & mag hydroxide-simeth (MAALOX/MYLANTA) 200-200-20 MG/5ML suspension 30 mL (not administered)  ondansetron (ZOFRAN) tablet 4 mg (not administered)  nicotine (NICODERM CQ - dosed in mg/24 hours) patch 21 mg (not administered)  zolpidem (AMBIEN) tablet 5 mg (not administered)  ibuprofen (ADVIL,MOTRIN) tablet 600 mg (not administered)  LORazepam (ATIVAN) tablet 1 mg (not administered)  acetaminophen (TYLENOL) tablet 650 mg (not administered)  sertraline (ZOLOFT) tablet 100 mg (not administered)  pantoprazole (PROTONIX) EC tablet 40 mg (not administered)  Ledipasvir-Sofosbuvir 90-400 MG TABS 1 tablet (not administered)  gabapentin (NEURONTIN) capsule 800 mg (not administered)     Initial Impression / Assessment and Plan / ED Course  I have reviewed the triage vital signs and the nursing  notes.  Pertinent labs & imaging results that were available during my care of the patient were reviewed by me and considered in my medical decision making (see chart for details).  41 year old male here with suicidal ideation. Reports this is secondary to uncontrolled drug and alcohol abuse. Patient was asleep prior to entering room, he had to be awoken for exam. Once awoken he is able to converse normally and provide adequate history. He is moving his extremities without noted ataxia. He has no apparent tremors, no seizure activity. Does not appear to be in acute withdrawal at this time. Screening lab work was obtained, alcohol elevated at 60. UDS is positive for benzos, marijuana, and cocaine. Patient has no other complaints at this time. He is medically cleared. TTS to evaluate. He is placed on CIWA protocol.  Home meds ordered.  Final Clinical Impressions(s) / ED Diagnoses   Final diagnoses:  Suicidal ideation  Polysubstance abuse    New Prescriptions New  Prescriptions   No medications on file     Garlon Hatchet, Cordelia Poche 11/27/16 [id number]    Mancel Bale, MD 11/28/16 [phone]

## 2016-11-27 NOTE — ED Notes (Signed)
Patient name called to lobby with no response x1.

## 2016-11-28 ENCOUNTER — Encounter (HOSPITAL_COMMUNITY): Payer: Self-pay | Admitting: *Deleted

## 2016-11-28 ENCOUNTER — Inpatient Hospital Stay (HOSPITAL_COMMUNITY)
Admission: EM | Admit: 2016-11-28 | Discharge: 2016-12-04 | DRG: 897 | Payer: Medicare Other | Source: Intra-hospital | Attending: Psychiatry | Admitting: Psychiatry

## 2016-11-28 DIAGNOSIS — Z888 Allergy status to other drugs, medicaments and biological substances status: Secondary | ICD-10-CM

## 2016-11-28 DIAGNOSIS — F1020 Alcohol dependence, uncomplicated: Secondary | ICD-10-CM

## 2016-11-28 DIAGNOSIS — F4310 Post-traumatic stress disorder, unspecified: Secondary | ICD-10-CM | POA: Diagnosis present

## 2016-11-28 DIAGNOSIS — B1920 Unspecified viral hepatitis C without hepatic coma: Secondary | ICD-10-CM | POA: Diagnosis present

## 2016-11-28 DIAGNOSIS — Z9114 Patient's other noncompliance with medication regimen: Secondary | ICD-10-CM | POA: Diagnosis not present

## 2016-11-28 DIAGNOSIS — F332 Major depressive disorder, recurrent severe without psychotic features: Secondary | ICD-10-CM | POA: Diagnosis present

## 2016-11-28 DIAGNOSIS — F10239 Alcohol dependence with withdrawal, unspecified: Secondary | ICD-10-CM | POA: Diagnosis present

## 2016-11-28 DIAGNOSIS — Z809 Family history of malignant neoplasm, unspecified: Secondary | ICD-10-CM | POA: Diagnosis not present

## 2016-11-28 DIAGNOSIS — F10230 Alcohol dependence with withdrawal, uncomplicated: Secondary | ICD-10-CM | POA: Diagnosis not present

## 2016-11-28 DIAGNOSIS — Z880 Allergy status to penicillin: Secondary | ICD-10-CM | POA: Diagnosis not present

## 2016-11-28 DIAGNOSIS — Z811 Family history of alcohol abuse and dependence: Secondary | ICD-10-CM

## 2016-11-28 DIAGNOSIS — G894 Chronic pain syndrome: Secondary | ICD-10-CM | POA: Diagnosis present

## 2016-11-28 DIAGNOSIS — G4700 Insomnia, unspecified: Secondary | ICD-10-CM | POA: Diagnosis present

## 2016-11-28 DIAGNOSIS — R45851 Suicidal ideations: Secondary | ICD-10-CM

## 2016-11-28 DIAGNOSIS — F419 Anxiety disorder, unspecified: Secondary | ICD-10-CM | POA: Diagnosis present

## 2016-11-28 DIAGNOSIS — F1124 Opioid dependence with opioid-induced mood disorder: Secondary | ICD-10-CM | POA: Diagnosis present

## 2016-11-28 DIAGNOSIS — Z599 Problem related to housing and economic circumstances, unspecified: Secondary | ICD-10-CM | POA: Diagnosis not present

## 2016-11-28 DIAGNOSIS — F10939 Alcohol use, unspecified with withdrawal, unspecified: Secondary | ICD-10-CM | POA: Diagnosis present

## 2016-11-28 DIAGNOSIS — Z818 Family history of other mental and behavioral disorders: Secondary | ICD-10-CM | POA: Diagnosis not present

## 2016-11-28 DIAGNOSIS — R1110 Vomiting, unspecified: Secondary | ICD-10-CM | POA: Diagnosis not present

## 2016-11-28 DIAGNOSIS — F17210 Nicotine dependence, cigarettes, uncomplicated: Secondary | ICD-10-CM | POA: Diagnosis present

## 2016-11-28 DIAGNOSIS — Z630 Problems in relationship with spouse or partner: Secondary | ICD-10-CM | POA: Diagnosis not present

## 2016-11-28 DIAGNOSIS — K7460 Unspecified cirrhosis of liver: Secondary | ICD-10-CM | POA: Diagnosis present

## 2016-11-28 DIAGNOSIS — Z79899 Other long term (current) drug therapy: Secondary | ICD-10-CM | POA: Diagnosis not present

## 2016-11-28 DIAGNOSIS — Z915 Personal history of self-harm: Secondary | ICD-10-CM | POA: Diagnosis not present

## 2016-11-28 DIAGNOSIS — Z9889 Other specified postprocedural states: Secondary | ICD-10-CM | POA: Diagnosis not present

## 2016-11-28 DIAGNOSIS — F1024 Alcohol dependence with alcohol-induced mood disorder: Secondary | ICD-10-CM | POA: Diagnosis present

## 2016-11-28 DIAGNOSIS — F1920 Other psychoactive substance dependence, uncomplicated: Secondary | ICD-10-CM | POA: Diagnosis present

## 2016-11-28 DIAGNOSIS — F515 Nightmare disorder: Secondary | ICD-10-CM | POA: Diagnosis present

## 2016-11-28 DIAGNOSIS — Y903 Blood alcohol level of 60-79 mg/100 ml: Secondary | ICD-10-CM | POA: Diagnosis present

## 2016-11-28 DIAGNOSIS — F10932 Alcohol use, unspecified with withdrawal with perceptual disturbance: Secondary | ICD-10-CM | POA: Diagnosis present

## 2016-11-28 MED ORDER — ADULT MULTIVITAMIN W/MINERALS CH: 1.0000 | ORAL_TABLET | Freq: Every day | ORAL | Status: DC

## 2016-11-28 MED ORDER — LORAZEPAM 1 MG PO TABS
1.0000 mg | ORAL_TABLET | Freq: Every day | ORAL | Status: DC
Start: 2016-12-02 — End: 2016-11-28

## 2016-11-28 MED ORDER — GABAPENTIN 400 MG PO CAPS
800.0000 mg | ORAL_CAPSULE | Freq: Four times a day (QID) | ORAL | Status: DC
  Administered 2016-11-28 – 2016-12-04 (×24): 800 mg via ORAL
  Filled 2016-11-28 (×38): qty 2

## 2016-11-28 MED ORDER — ADULT MULTIVITAMIN W/MINERALS CH
1.0000 | ORAL_TABLET | Freq: Every day | ORAL | Status: DC
  Administered 2016-11-28 – 2016-12-04 (×7): 1 via ORAL
  Filled 2016-11-28 (×5): qty 1
  Filled 2016-11-28: qty 21
  Filled 2016-11-28 (×4): qty 1

## 2016-11-28 MED ORDER — HYDROXYZINE HCL 25 MG PO TABS
25.0000 mg | ORAL_TABLET | Freq: Four times a day (QID) | ORAL | Status: DC | PRN
Start: 2016-11-28 — End: 2016-11-28

## 2016-11-28 MED ORDER — THIAMINE HCL 100 MG/ML IJ SOLN: 100.0000 mg | Freq: Once | INTRAMUSCULAR | Status: DC

## 2016-11-28 MED ORDER — CHLORDIAZEPOXIDE HCL 25 MG PO CAPS: 25.0000 mg | ORAL_CAPSULE | Freq: Four times a day (QID) | ORAL | Status: DC | PRN

## 2016-11-28 MED ORDER — LORAZEPAM 1 MG PO TABS: 1.0000 mg | ORAL_TABLET | Freq: Three times a day (TID) | ORAL | Status: DC

## 2016-11-28 MED ORDER — HYDROXYZINE HCL 25 MG PO TABS: 25.0000 mg | ORAL_TABLET | Freq: Four times a day (QID) | ORAL | Status: DC | PRN

## 2016-11-28 MED ORDER — LORAZEPAM 1 MG PO TABS: 1.0000 mg | ORAL_TABLET | Freq: Two times a day (BID) | ORAL | Status: DC

## 2016-11-28 MED ORDER — MAGNESIUM HYDROXIDE 400 MG/5ML PO SUSP
30.0000 mL | Freq: Every day | ORAL | Status: DC | PRN
  Administered 2016-12-03: 30 mL via ORAL
  Filled 2016-11-28: qty 30

## 2016-11-28 MED ORDER — CHLORDIAZEPOXIDE HCL 25 MG PO CAPS
25.0000 mg | ORAL_CAPSULE | ORAL | Status: AC
  Administered 2016-11-30 (×2): 25 mg via ORAL
  Filled 2016-11-28 (×2): qty 1

## 2016-11-28 MED ORDER — ENSURE ENLIVE PO LIQD
237.0000 mL | Freq: Two times a day (BID) | ORAL | Status: DC
  Administered 2016-11-28: 237 mL via ORAL

## 2016-11-28 MED ORDER — CHLORDIAZEPOXIDE HCL 25 MG PO CAPS
25.0000 mg | ORAL_CAPSULE | Freq: Four times a day (QID) | ORAL | Status: AC
  Administered 2016-11-28 (×3): 25 mg via ORAL
  Filled 2016-11-28 (×3): qty 1

## 2016-11-28 MED ORDER — LOPERAMIDE HCL 2 MG PO CAPS: 2.0000 mg | ORAL_CAPSULE | ORAL | Status: AC | PRN

## 2016-11-28 MED ORDER — ONDANSETRON 4 MG PO TBDP: 4.0000 mg | ORAL_TABLET | Freq: Four times a day (QID) | ORAL | Status: DC | PRN

## 2016-11-28 MED ORDER — LOPERAMIDE HCL 2 MG PO CAPS: 2.0000 mg | ORAL_CAPSULE | ORAL | Status: DC | PRN

## 2016-11-28 MED ORDER — CHLORDIAZEPOXIDE HCL 25 MG PO CAPS: 25.0000 mg | ORAL_CAPSULE | Freq: Every day | ORAL | Status: AC

## 2016-11-28 MED ORDER — SERTRALINE HCL 100 MG PO TABS
100.0000 mg | ORAL_TABLET | Freq: Every day | ORAL | Status: DC
  Administered 2016-11-30 – 2016-12-03 (×4): 100 mg via ORAL
  Filled 2016-11-28 (×7): qty 1

## 2016-11-28 MED ORDER — THIAMINE HCL 100 MG/ML IJ SOLN: 100.0000 mg | Freq: Once | INTRAMUSCULAR | Status: AC

## 2016-11-28 MED ORDER — VITAMIN B-1 100 MG PO TABS
100.0000 mg | ORAL_TABLET | Freq: Every day | ORAL | Status: DC
  Administered 2016-11-29 – 2016-12-04 (×6): 100 mg via ORAL
  Filled 2016-11-28 (×6): qty 1
  Filled 2016-11-28: qty 21
  Filled 2016-11-28 (×2): qty 1

## 2016-11-28 MED ORDER — ALUM & MAG HYDROXIDE-SIMETH 200-200-20 MG/5ML PO SUSP: 30.0000 mL | ORAL | Status: DC | PRN

## 2016-11-28 MED ORDER — VITAMIN B-1 100 MG PO TABS: 100.0000 mg | ORAL_TABLET | Freq: Every day | ORAL | Status: DC

## 2016-11-28 MED ORDER — ONDANSETRON 4 MG PO TBDP
4.0000 mg | ORAL_TABLET | Freq: Four times a day (QID) | ORAL | Status: AC | PRN
  Administered 2016-11-28 – 2016-12-01 (×6): 4 mg via ORAL
  Filled 2016-11-28 (×6): qty 1

## 2016-11-28 MED ORDER — CHLORDIAZEPOXIDE HCL 25 MG PO CAPS
25.0000 mg | ORAL_CAPSULE | Freq: Three times a day (TID) | ORAL | Status: AC
  Administered 2016-11-29 (×3): 25 mg via ORAL
  Filled 2016-11-28 (×3): qty 1

## 2016-11-28 MED ORDER — LORAZEPAM 1 MG PO TABS: 1.0000 mg | ORAL_TABLET | Freq: Four times a day (QID) | ORAL | Status: DC | PRN

## 2016-11-28 MED ORDER — CHLORDIAZEPOXIDE HCL 25 MG PO CAPS
25.0000 mg | ORAL_CAPSULE | Freq: Four times a day (QID) | ORAL | Status: AC | PRN
  Administered 2016-11-29 – 2016-12-01 (×6): 25 mg via ORAL
  Filled 2016-11-28 (×6): qty 1

## 2016-11-28 MED ORDER — TRAZODONE HCL 50 MG PO TABS
50.0000 mg | ORAL_TABLET | Freq: Every evening | ORAL | Status: DC | PRN
  Administered 2016-11-28 – 2016-12-02 (×6): 50 mg via ORAL
  Filled 2016-11-28 (×13): qty 1

## 2016-11-28 MED ORDER — GUAIFENESIN ER 600 MG PO TB12
1200.0000 mg | ORAL_TABLET | Freq: Two times a day (BID) | ORAL | Status: DC | PRN
Start: 2016-11-28 — End: 2016-12-01

## 2016-11-28 MED ORDER — NICOTINE 21 MG/24HR TD PT24
21.0000 mg | MEDICATED_PATCH | Freq: Every day | TRANSDERMAL | Status: DC
  Administered 2016-11-29 – 2016-12-01 (×3): 21 mg via TRANSDERMAL
  Filled 2016-11-28 (×6): qty 1

## 2016-11-28 MED ORDER — HYDROXYZINE HCL 25 MG PO TABS
25.0000 mg | ORAL_TABLET | Freq: Four times a day (QID) | ORAL | Status: AC | PRN
  Administered 2016-11-28 – 2016-12-01 (×7): 25 mg via ORAL
  Filled 2016-11-28 (×7): qty 1

## 2016-11-28 MED ORDER — LORAZEPAM 1 MG PO TABS: 1.0000 mg | ORAL_TABLET | Freq: Four times a day (QID) | ORAL | Status: DC

## 2016-11-28 MED ORDER — BENZONATATE 100 MG PO CAPS: 100.0000 mg | ORAL_CAPSULE | Freq: Three times a day (TID) | ORAL | Status: DC | PRN

## 2016-11-28 MED ORDER — ACETAMINOPHEN 325 MG PO TABS
650.0000 mg | ORAL_TABLET | Freq: Four times a day (QID) | ORAL | Status: DC | PRN
  Administered 2016-11-30 – 2016-12-03 (×8): 650 mg via ORAL
  Filled 2016-11-28 (×8): qty 2

## 2016-11-28 MED ORDER — PANTOPRAZOLE SODIUM 40 MG PO TBEC
40.0000 mg | DELAYED_RELEASE_TABLET | Freq: Every day | ORAL | Status: DC
  Administered 2016-11-29 – 2016-12-04 (×6): 40 mg via ORAL
  Filled 2016-11-28 (×2): qty 1
  Filled 2016-11-28: qty 21
  Filled 2016-11-28 (×6): qty 1

## 2016-11-28 NOTE — ED Notes (Signed)
Pt discharged in care of [facility] transport. Belongings given to driver. Pt appeared in NAD and verbalized readiness for transfer.

## 2016-11-28 NOTE — BHH Counselor (Signed)
[facility] to bed 300-1 to Dr. Jama Flavorsobos per Clint Bolderori Beck, Ac. Nursing report # is [id number].  Orlie PollenAshley Joan Avetisyan, LPC, NCC,  LCAS-A Therapeutic Triage Specialist  11/28/2016 6:05 AM

## 2016-11-28 NOTE — Progress Notes (Signed)
Patient ID: [name] PennerGary M Eddie, male   DOB: Feb 10, 1976, 41 y.o.   MRN: [id number]   Pt currently presents with a flat affect and restless, fidgety behavior. Pt reports to writer that their goal is to "get some sleep." Pt displays signs and symptoms of withdrawal including body aches, shaking, generalized aches and agitation. Pt reports poor sleep with current medication regimen.   Pt provided with medications per providers orders. Pt's labs and vitals were monitored throughout the night. Pt given a 1:1 about emotional and mental status. Pt supported and encouraged to express concerns and questions. Pt educated on medications.  Pt's safety ensured with 15 minute and environmental checks. Pt currently denies SI/HI and A/V hallucinations. Pt verbally agrees to seek staff if SI/HI or A/VH occurs and to consult with staff before acting on any harmful thoughts. Will continue POC.

## 2016-11-28 NOTE — BH Assessment (Addendum)
[facility] Assessment Progress Note  Per Donell SievertSpencer Simon, PA, this pt requires psychiatric hospitalization at this time.  Clint Bolderori Beck, RN, [facility]  Pt has signed Voluntary Admission and Consent for Treatment, as well as Consent to Release Information, and signed [facility].  Pt's nurse, Clydie BraunKaren, has been notified, and agrees to send original paperwork along with pt via [facility], and to call report to [phone].  Doylene Canninghomas Venora Kautzman, MA Triage Specialist [phone]  Addendum:  [facility]  Pt's nurse has been notified.  Doylene Canninghomas Sindhu Nguyen, MA Triage Specialist [phone]

## 2016-11-28 NOTE — Progress Notes (Signed)
Patient ID: [name] [name], male   DOB: [birth year], 41 y.o.   MRN: [id number] PER STATE REGULATIONS 482.30  THIS CHART WAS REVIEWED FOR MEDICAL NECESSITY WITH RESPECT TO THE PATIENT'S ADMISSION/DURATION OF STAY.  NEXT REVIEW DATE:12/02/16  Loura HaltBARBARA Dannie Woolen, RN, BSN CASE MANAGER

## 2016-11-28 NOTE — Progress Notes (Signed)
Patient vol admitted from [facility] with polysubstance dependence. Patient's UDS + for THC, cocaine and benzos. States he is using opiates as well - suboxone off the street and heroin - and drinking a fifth + a pint/daily (BAL is 60). Patient also presents with SI to "blow my brains out." CIWA is "11", COWS a "12" with elevated BP. Multiple physical complaints - crawling skin, body aches, sensitivity to sound and light, tremulous. Affect anxious with congruent mood. Disheveled in appearance. Reports 25lbs weight loss in the last few months. "I go on benders." Patient states he [place] ([place] in [place] are separated. "One night I went on a bender and just didn't go home. [place]. I'm not sure who I can stay with, though."   Patient's skin search completed, meal/fluids provided and patient oriented to unit. Medicated per orders, vistaril prn given for anxiety. NP Nwoko made aware of patient's complaints of severe withdrawal. Emotional support and reassurance provided. Fluids provided and encouraged.  Patient verbalizes understanding of POC. Denies SI while in the hospital. "It's what I might do if I'm out there. I don't trust myself. I'm in a bad place." No HI/AVH and patient remains safe on level III obs. Report given to Zada FindersJane O. RN.

## 2016-11-28 NOTE — Tx Team (Signed)
Initial Treatment Plan 11/28/2016 2:31 PM [name] [name] [id number]    PATIENT STRESSORS: Financial difficulties Marital or family conflict Medication change or noncompliance Occupational concerns Substance abuse   PATIENT STRENGTHS: Ability for insight Average or above average intelligence Communication skills General fund of knowledge Motivation for treatment/growth Physical Health Supportive family/friends Work skills   PATIENT IDENTIFIED PROBLEMS: "I want to detox and feel better."    "I need to get stabilized on meds and get rid of the suicidal thoughts."                 DISCHARGE CRITERIA:  Improved stabilization in mood, thinking, and/or behavior Motivation to continue treatment in a less acute level of care Need for constant or close observation no longer present Reduction of life-threatening or endangering symptoms to within safe limits Verbal commitment to aftercare and medication compliance Withdrawal symptoms are absent or subacute and managed without 24-hour nursing intervention  PRELIMINARY DISCHARGE PLAN: Attend 12-step recovery group Outpatient therapy  PATIENT/FAMILY INVOLVEMENT: This treatment plan has been presented to and reviewed with the patient, [name] [name], and/or family member.  The patient and family have been given the opportunity to ask questions and make suggestions.  Lawrence MarseillesFriedman, Wilberta Dorvil Eakes, RN 11/28/2016, 2:31 PM

## 2016-11-28 NOTE — ED Notes (Signed)
Pt is sleepy and hardly able to keep eyes open when speaking to him and obtaining vital signs.

## 2016-11-28 NOTE — ED Notes (Signed)
[facility] called for transport. Handoff previously called to [facility] RN. Pt has signed for discharge and verbalizes readiness for transfer.

## 2016-11-28 NOTE — ED Notes (Signed)
Latricia, RN has reviewed chart and accepted care of patient. Pt ambulated to room [facility] 37. [facility]

## 2016-11-28 NOTE — ED Notes (Signed)
Pt transferred from TCU, A&O x 3, gait steady, presents with SI, plan to shoot self.  Denies at present. Denies HI or AVH.  Feeling hopeless.  Admits to previous SI attempt years ago by taking pills.  Pt reports diagnosed with Bipolar Depression and PTSD in the past.  Monitoring for safety, Q 15 min checks in effect.  Safety check for contraband completed, no items found.

## 2016-11-28 NOTE — BH Assessment (Addendum)
Tele Assessment Note   [name] PennerGary M Wain is a 41 y.o. male who present voluntarily to [facility]. Pt states he has been suffering with depression for a while. Pt states he uses substances to manage depressive symptoms. Pt admits to drinking alcohol, doing opiates and heroin, and occasionally smoking marijuana. Pt states he is currently having suicidal thoughts of "blowing his brains out" because he is fed up with all the stressors in his life. Pt denies H/I and AV hallucinations. Pt also states he was [facility] similar symptoms. Pt is currently not taking medications.    Pt was dressed in hospital scrubs. Pt was not alert and was unable to be assessed to orientation.Pt was drowsy and kept falling in and out of sleep during assessment. Therapist had to be redirected several times to stay awake and to focus on the questions.   Diagnosis:Bipolar depression; Alcohol use disorder; Cannabis Use Disorder; Opiate Use Disorder   Past Medical History:  Past Medical History:  Diagnosis Date  . Bipolar disorder (HCC)   . Cellulitis   . Depression   . Hepatitis C     Past Surgical History:  Procedure Laterality Date  . BACK SURGERY    . ROTATOR CUFF REPAIR    . SHOULDER SURGERY      Family History:  Family History  Problem Relation Age of Onset  . Alcohol abuse Father   . Cancer Maternal Aunt     Social History:  reports that he has been smoking Cigarettes.  He started smoking about 35 years ago. He has been smoking about 1.00 pack per day. He has never used smokeless tobacco. He reports that he drinks about 7.2 oz of alcohol per week . He reports that he does not use drugs.  Additional Social History:  Alcohol / Drug Use Pain Medications: See PTA medication list Prescriptions: Suboxone 8mg  2x/D, Neurontin, Zoloft, Wellbutrin, Trazadone Over the Counter: None History of alcohol / drug use?: Yes Longest period of sobriety (when/how long): 2 years sobriety about 8 years  ago Negative Consequences of Use: Personal relationships Substance #1 Name of Substance 1: Alcohol  1 - Age of First Use: unknown  1 - Amount (size/oz): 5th of liquor  1 - Frequency: daily 1 - Duration: ongoing  1 - Last Use / Amount: 11/26/16 Substance #2 Name of Substance 2: Marijuana  2 - Age of First Use: 41 years of age 45 - Amount (size/oz): 3 blunts per day 2 - Frequency: Daily use 2 - Duration: "Good long time" 2 - Last Use / Amount: unknown  Substance #3 Name of Substance 3: Opiates  3 - Age of First Use: Teens 3 - Amount (size/oz): "Three or four per day" 3 - Frequency: 3-4 times in a wek 3 - Duration: unknown  3 - Last Use / Amount: unkonwn   CIWA: CIWA-Ar BP: 116/58 Pulse Rate: 65 Nausea and Vomiting: mild nausea with no vomiting Tactile Disturbances: none Tremor: two Auditory Disturbances: not present Paroxysmal Sweats: no sweat visible Visual Disturbances: not present Anxiety: no anxiety, at ease Headache, Fullness in Head: none present Agitation: normal activity Orientation and Clouding of Sensorium: oriented and can do serial additions CIWA-Ar Total: 3 COWS:    PATIENT STRENGTHS: (choose at least two) Ability for insight Average or above average intelligence Communication skills General fund of knowledge Physical Health  Allergies:  Allergies  Allergen Reactions  . Lithium Anaphylaxis  . Penicillins Rash    Has patient had  a PCN reaction causing immediate rash, facial/tongue/throat swelling, SOB or lightheadedness with hypotension: Yes Has patient had a PCN reaction causing severe rash involving mucus membranes or skin necrosis: No Has patient had a PCN reaction that required hospitalization No Has patient had a PCN reaction occurring within the last 10 years: No If all of the above answers are "NO", then may proceed with Cephalosporin use.     Home Medications:  (Not in a hospital admission)  OB/GYN Status:  No LMP for male  patient.  General Assessment Data Location of Assessment: [facility] ED TTS Assessment: In system Is this a Tele or Face-to-Face Assessment?: Face-to-Face Is this an Initial Assessment or a Re-assessment for this encounter?: Initial Assessment Marital status: Separated Is patient pregnant?: No Pregnancy Status: No Living Arrangements: Other relatives, Alone Can pt return to current living arrangement?: Yes Admission Status: Voluntary Is patient capable of signing voluntary admission?: Yes Referral Source: Self/Family/Friend Insurance type: Medicare  Medical Screening Exam [facility] Walk-in ONLY) Medical Exam completed: Yes  Crisis Care Plan Living Arrangements: Other relatives, Alone Name of Psychiatrist: none  Name of Therapist: none   Education Status Is patient currently in school?: No Current Grade: na Highest grade of school patient has completed: unknown  Name of school: na Contact person: na  Risk to self with the past 6 months Suicidal Ideation: Yes-Currently Present Has patient been a risk to self within the past 6 months prior to admission? : No Suicidal Intent: Yes-Currently Present Has patient had any suicidal intent within the past 6 months prior to admission? : No Is patient at risk for suicide?: Yes Suicidal Plan?: No Has patient had any suicidal plan within the past 6 months prior to admission? : No Access to Means: Yes Specify Access to Suicidal Means: has a gun at home What has been your use of drugs/alcohol within the last 12 months?: ongoing  Previous Attempts/Gestures: No How many times?: 0 Other Self Harm Risks: denies Triggers for Past Attempts: Unpredictable Intentional Self Injurious Behavior: None Family Suicide History: Unknown Recent stressful life event(s): Conflict (Comment), Financial Problems, Job Loss Persecutory voices/beliefs?: No Depression: Yes Depression Symptoms: Fatigue, Guilt, Feeling angry/irritable, Feeling worthless/self pity,  Isolating Substance abuse history and/or treatment for substance abuse?: Yes Suicide prevention information given to non-admitted patients: Yes  Risk to Others within the past 6 months Homicidal Ideation: No Does patient have any lifetime risk of violence toward others beyond the six months prior to admission? : No Thoughts of Harm to Others: No Current Homicidal Intent: No Current Homicidal Plan: No Access to Homicidal Means: No Identified Victim: na History of harm to others?: No Assessment of Violence: None Noted Violent Behavior Description: none noted Does patient have access to weapons?: Yes (Comment) Criminal Charges Pending?: No Does patient have a court date: No Is patient on probation?: No  Psychosis Hallucinations: Auditory Delusions: None noted  Mental Status Report Appearance/Hygiene: In scrubs Eye Contact: Poor Motor Activity: Freedom of movement Speech: Slurred, Rapid Level of Consciousness: Sleeping, Drowsy Mood: Anxious, Irritable Affect: Appropriate to circumstance Anxiety Level: Minimal Thought Processes: Relevant Judgement: Impaired Orientation: Person, Place, Situation Obsessive Compulsive Thoughts/Behaviors: None  Cognitive Functioning Concentration: Decreased Memory: Unable to Assess IQ: Average Insight: Unable to Assess Impulse Control: Unable to Assess Appetite: Fair Weight Loss: 0 Weight Gain: 0 Sleep: Unable to Assess Vegetative Symptoms: Unable to Assess  ADLScreening [facility] Assessment Services) Patient's cognitive ability adequate to safely complete daily activities?: Yes Patient able to express need for assistance with  ADLs?: Yes Independently performs ADLs?: Yes (appropriate for developmental age)  Prior Inpatient Therapy Prior Inpatient Therapy: No Prior Therapy Dates: na Prior Therapy Facilty/Provider(s): na Reason for Treatment: na  Prior Outpatient Therapy Prior Outpatient Therapy: No Prior Therapy Dates: na Prior  Therapy Facilty/Provider(s): na Reason for Treatment: na Does patient have an ACCT team?: No Does patient have Intensive In-House Services?  : No Does patient have [facility] services? : No Does patient have P4CC services?: No  ADL Screening (condition at time of admission) Patient's cognitive ability adequate to safely complete daily activities?: Yes Is the patient deaf or have difficulty hearing?: No Does the patient have difficulty seeing, even when wearing glasses/contacts?: No Does the patient have difficulty concentrating, remembering, or making decisions?: No Patient able to express need for assistance with ADLs?: Yes Does the patient have difficulty dressing or bathing?: No Independently performs ADLs?: Yes (appropriate for developmental age) Does the patient have difficulty walking or climbing stairs?: No Weakness of Legs: None Weakness of Arms/Hands: None  Home Assistive Devices/Equipment Home Assistive Devices/Equipment: None    Abuse/Neglect Assessment (Assessment to be complete while patient is alone) Physical Abuse: Denies Verbal Abuse: Denies Sexual Abuse: Denies Exploitation of patient/patient's resources: Denies Self-Neglect: Denies Values / Beliefs Cultural Requests During Hospitalization: None Spiritual Requests During Hospitalization: None   Advance Directives (For Healthcare) Does Patient Have a Medical Advance Directive?: No Would patient like information on creating a medical advance directive?: No - Patient declined    Additional Information 1:1 In Past 12 Months?: No CIRT Risk: No Elopement Risk: No Does patient have medical clearance?: No     Disposition: Gave clinical report to Donell Sievert, PA who states pt meets inpatient criteria. TTS to seek placement. Notified Mardene Celeste, RN of decision.     Morrie Sheldon n Dyani Babel 11/28/2016 1:47 AM

## 2016-11-28 NOTE — ED Notes (Signed)
TTS at bedside. 

## 2016-11-29 DIAGNOSIS — R45851 Suicidal ideations: Secondary | ICD-10-CM

## 2016-11-29 DIAGNOSIS — F1020 Alcohol dependence, uncomplicated: Secondary | ICD-10-CM

## 2016-11-29 DIAGNOSIS — Z79899 Other long term (current) drug therapy: Secondary | ICD-10-CM

## 2016-11-29 DIAGNOSIS — F4310 Post-traumatic stress disorder, unspecified: Secondary | ICD-10-CM

## 2016-11-29 DIAGNOSIS — Z888 Allergy status to other drugs, medicaments and biological substances status: Secondary | ICD-10-CM

## 2016-11-29 DIAGNOSIS — Z811 Family history of alcohol abuse and dependence: Secondary | ICD-10-CM

## 2016-11-29 DIAGNOSIS — Z809 Family history of malignant neoplasm, unspecified: Secondary | ICD-10-CM

## 2016-11-29 DIAGNOSIS — Z9889 Other specified postprocedural states: Secondary | ICD-10-CM

## 2016-11-29 DIAGNOSIS — Z818 Family history of other mental and behavioral disorders: Secondary | ICD-10-CM

## 2016-11-29 DIAGNOSIS — Z880 Allergy status to penicillin: Secondary | ICD-10-CM

## 2016-11-29 MED ORDER — CLONIDINE HCL 0.1 MG PO TABS
0.1000 mg | ORAL_TABLET | Freq: Every day | ORAL | Status: DC
  Administered 2016-12-03: 0.1 mg via ORAL
  Filled 2016-11-29 (×2): qty 1

## 2016-11-29 MED ORDER — NAPROXEN 500 MG PO TABS
500.0000 mg | ORAL_TABLET | Freq: Two times a day (BID) | ORAL | Status: AC | PRN
Start: 2016-11-29 — End: 2016-12-04
  Administered 2016-11-29 – 2016-12-04 (×9): 500 mg via ORAL
  Filled 2016-11-29 (×10): qty 1

## 2016-11-29 MED ORDER — QUETIAPINE FUMARATE 100 MG PO TABS
100.0000 mg | ORAL_TABLET | Freq: Every day | ORAL | Status: DC
  Administered 2016-11-29 – 2016-12-02 (×4): 100 mg via ORAL
  Filled 2016-11-29 (×6): qty 1

## 2016-11-29 MED ORDER — DICYCLOMINE HCL 20 MG PO TABS
20.0000 mg | ORAL_TABLET | Freq: Four times a day (QID) | ORAL | Status: AC | PRN
Start: 2016-11-29 — End: 2016-12-04
  Administered 2016-11-30 – 2016-12-03 (×7): 20 mg via ORAL
  Filled 2016-11-29 (×7): qty 1

## 2016-11-29 MED ORDER — CLONIDINE HCL 0.1 MG PO TABS
0.1000 mg | ORAL_TABLET | Freq: Four times a day (QID) | ORAL | Status: AC
  Administered 2016-11-29 – 2016-11-30 (×7): 0.1 mg via ORAL
  Filled 2016-11-29 (×9): qty 1

## 2016-11-29 MED ORDER — METHOCARBAMOL 500 MG PO TABS
500.0000 mg | ORAL_TABLET | Freq: Three times a day (TID) | ORAL | Status: AC | PRN
  Administered 2016-11-29 – 2016-12-04 (×12): 500 mg via ORAL
  Filled 2016-11-29 (×6): qty 1
  Filled 2016-11-29: qty 20
  Filled 2016-11-29 (×5): qty 1

## 2016-11-29 MED ORDER — CLONIDINE HCL 0.1 MG PO TABS
0.1000 mg | ORAL_TABLET | ORAL | Status: AC
  Administered 2016-12-01 – 2016-12-02 (×4): 0.1 mg via ORAL
  Filled 2016-11-29 (×4): qty 1

## 2016-11-29 NOTE — H&P (Signed)
Psychiatric Admission Assessment Adult  Patient Identification: [name] [name]  MRN:  [id number]  Date of Evaluation:  11/29/2016  Chief Complaint: Drug intoxications/suicidal ideations.  Principal Diagnosis: Polysubstance dependence (including Opioid drugs) with physiological dependence  Diagnosis:   Patient Active Problem List   Diagnosis Date Noted  . Polysubstance (including opioids) dependence with physiological dependence [facility]) [F19.20] 01/27/2013    Priority: High    Class: Acute  . Alcohol withdrawal ([place]) [F10.239] 01/27/2013    Priority: High    Class: Acute  . Bipolar affective disorder ([place]) [F31.9]     Priority: Medium  . ETOH abuse [F10.10]   . Hepatic cirrhosis ([place]) [K74.60] 05/24/2015  . Suicidal ideation [R45.851]   . Recurrent cellulitis of lower leg [L03.119] 11/09/2014  . Venous (peripheral) insufficiency [I87.2] 11/09/2014  . Alcohol dependence with alcohol-induced mood disorder ([place]) [F10.24]   . Opioid dependence with opioid-induced mood disorder ([place]) [F11.24]   . Severe recurrent major depression without psychotic features ([place]) [F33.2] 09/09/2014  . MDD (major depressive disorder) [F32.9] 09/09/2014  . Suicidal ideations [R45.851]   . Chronic pain syndrome [G89.4] 05/31/2014  . Tobacco dependence [F17.200] 05/30/2014  . Poor dentition [K08.9] 02/03/2014  . Hepatitis C virus infection without hepatic coma [B19.20] 02/03/2014  . Sepsis ([place]) [A41.9] 01/29/2014  . Anxiety state, unspecified [F41.1] 01/31/2013  . Mood disorder in conditions classified elsewhere [F06.30] 01/31/2013   History of Present Illness:This is an admission assessment for this 41 year old Caucasian male with hx of mental illness & Polysubstance dependence since teenage years. He is being admitted to the [facility] adult unit with complaints of suicidal ideations with plans to shoot himself with a gun triggered by his relapse on drugs/excessive drug use. He currently actively withdrawing.    During this assessment, [name] [name] reports, "My sister dropped me off at the [facility] yesterday. I have been using a lot of drugs/alcohol (Benzodiazepines, Alcohol & Subutex). I buy them off the streets. I have been using heavily x 9 months. I use drugs to cope with life & my personal problems. I have marital issues with my wife including financial problems. I'm working some, but, I draw disability benefits due to back problems/surgeries. I started using drugs & alcohol since age 49. I was sober x 1 year, relapsed 9 months ago. I have had substance abuse treatment at the [facility] about 2 years ago. I started AA/NA meetings 15 years, then will quit, relapse, start meetings ago, will repeat the cycle. I was diagnosed with Depression, anxiety & PTSD 4 years ago. The PTSD symptoms came from me watching my maternal cousin shoot himself. I was on medications (Wellbutrin, Gabapentin, Sertraline, Seroquel & Trazodone). I have benefited the most from Wellbutrin & Gabapentin. I stopped taking these medicines about a week ago because I was drinking heavily & getting high a lot. I have had one suicide attempt during my teen years by overdose. I was hospitalized, my stomach was pumped. I have been feeling suicidal x 2 weeks now with plans to shoot myself with a pistol. I'm having bad withdrawal symptoms now; muscle cramps, abdominal pains, my skin crawling, bad ass anxiety & headaches. I need help with detox & substance abuse treatment after discharge. My mother has schizophrenia"  Associated Signs/Symptoms: Depression Symptoms:  depressed mood, insomnia, feelings of worthlessness/guilt, hopelessness, suicidal thoughts with specific plan, anxiety,  (Hypo) Manic Symptoms:  Impulsivity, Irritable Mood, Labiality of Mood,  Anxiety Symptoms:  Excessive Worry,  Psychotic Symptoms:  Currently denies any hallucinations, delusional thoughts or paranoia.  PTSD Symptoms: Had a  traumatic exposure:  "I saw my cousin shoot himself to death when I was a teenager" Re-experiencing:  Flashbacks Nightmares  Total Time spent with patient: 1 hour  Past Psychiatric History: Major depression, anxiety disorder, polysubstance dependence.  Is the patient at risk to self? Yes.   (able to contract for safety). Has the patient been a risk to self in the past 6 months? No.  Has the patient been a risk to self within the distant past? Yes.    Is the patient a risk to others? No.  Has the patient been a risk to others in the past 6 months? No.  Has the patient been a risk to others within the distant past? No.   Prior Inpatient Therapy: Yes, Brought on Hospital years ago" Prior Outpatient Therapy: Yes (Dr. Lorel Monaco".  Alcohol Screening: 1. How often do you have a drink containing alcohol?: 4 or more times a week 2. How many drinks containing alcohol do you have on a typical day when you are drinking?: 10 or more 3. How often do you have six or more drinks on one occasion?: Daily or almost daily Preliminary Score: 8 4. How often during the last year have you found that you were not able to stop drinking once you had started?: Daily or almost daily 5. How often during the last year have you failed to do what was normally expected from you becasue of drinking?: Weekly 6. How often during the last year have you needed a first drink in the morning to get yourself going after a heavy drinking session?: Daily or almost daily 7. How often during the last year have you had a feeling of guilt of remorse after drinking?: Weekly 8. How often during the last year have you been unable to remember what happened the night before because you had been drinking?: Weekly 9. Have you or someone else been injured as a result of your drinking?: Yes, but not in the last year 10. Has a relative or friend or a doctor or another health worker been concerned about your drinking or suggested you cut down?:  Yes, during the last year Alcohol Use Disorder Identification Test Final Score (AUDIT): 35 Brief Intervention: Yes  Substance Abuse History in the last 12 months:  Yes.    Consequences of Substance Abuse: Medical Consequences:  Liver damage, Possible death by overdose Legal Consequences:  Arrests, jail time, Loss of driving privilege. Family Consequences:  Family discord, divorce and or separation.  Previous Psychotropic Medications: Seroquel, Wellbutrin, Sertraline, Gabapentin.   Psychological Evaluations: Yes   Past Medical History:  Past Medical History:  Diagnosis Date  . Bipolar disorder ([place])   . Cellulitis   . Depression   . Hepatitis C     Past Surgical History:  Procedure Laterality Date  . BACK SURGERY    . ROTATOR CUFF REPAIR    . SHOULDER SURGERY     Family History:  Family History  Problem Relation Age of Onset  . Alcohol abuse Father   . Cancer Maternal Aunt    Family Psychiatric  History: Schizophrenia: Mother.  Tobacco Screening: Have you used any form of tobacco in the last 30 days? (Cigarettes, Smokeless Tobacco, Cigars, and/or Pipes): Yes Tobacco use, Select all that apply: 5 or more cigarettes per day Are you interested in Tobacco Cessation Medications?: Yes, will notify MD for an order Counseled patient on smoking cessation including  recognizing danger situations, developing coping skills and basic information about quitting provided: Refused/Declined practical counseling  Social History:  History  Alcohol Use  . 7.2 oz/week  . 12 Cans of beer per week    Comment: heavy     History  Drug Use No    Comment: denies at this time    Additional Social History: Patient sates is married with 2 children (girls). Currently having some marital conflicts. Patient is disabled due to back problems/surgeries. Currently collecting disability benefits. Lives in [place], [place].  Allergies:   Allergies  Allergen Reactions  . Lithium Anaphylaxis  .  Penicillins Rash    Has patient had a PCN reaction causing immediate rash, facial/tongue/throat swelling, SOB or lightheadedness with hypotension: Yes Has patient had a PCN reaction causing severe rash involving mucus membranes or skin necrosis: No Has patient had a PCN reaction that required hospitalization No Has patient had a PCN reaction occurring within the last 10 years: No If all of the above answers are "NO", then may proceed with Cephalosporin use.    Lab Results:  Results for orders placed or performed during the hospital encounter of 11/27/16 (from the past 48 hour(s))  Rapid urine drug screen (hospital performed)     Status: Abnormal   Collection Time: 11/27/16  9:19 PM  Result Value Ref Range   Opiates NONE DETECTED NONE DETECTED   Cocaine POSITIVE (A) NONE DETECTED   Benzodiazepines POSITIVE (A) NONE DETECTED   Amphetamines NONE DETECTED NONE DETECTED   Tetrahydrocannabinol POSITIVE (A) NONE DETECTED   Barbiturates NONE DETECTED NONE DETECTED    Comment:        DRUG SCREEN FOR MEDICAL PURPOSES ONLY.  IF CONFIRMATION IS NEEDED FOR ANY PURPOSE, NOTIFY LAB WITHIN 5 DAYS.        LOWEST DETECTABLE LIMITS FOR URINE DRUG SCREEN Drug Class       Cutoff (ng/mL) Amphetamine      1000 Barbiturate      200 Benzodiazepine   846 Tricyclics       659 Opiates          300 Cocaine          300 THC              50   Comprehensive metabolic panel     Status: None   Collection Time: 11/27/16  9:35 PM  Result Value Ref Range   Sodium 142 135 - 145 mmol/L   Potassium 4.0 3.5 - 5.1 mmol/L   Chloride 106 101 - 111 mmol/L   CO2 26 22 - 32 mmol/L   Glucose, Bld 92 65 - 99 mg/dL   BUN 10 6 - 20 mg/dL   Creatinine, Ser 0.88 0.61 - 1.24 mg/dL   Calcium 9.2 8.9 - 10.3 mg/dL   Total Protein 7.7 6.5 - 8.1 g/dL   Albumin 4.3 3.5 - 5.0 g/dL   AST 24 15 - 41 U/L   ALT 26 17 - 63 U/L   Alkaline Phosphatase 68 38 - 126 U/L   Total Bilirubin 0.5 0.3 - 1.2 mg/dL   GFR calc non Af Amer >60  >60 mL/min   GFR calc Af Amer >60 >60 mL/min    Comment: (NOTE) The eGFR has been calculated using the CKD EPI equation. This calculation has not been validated in all clinical situations. eGFR's persistently <60 mL/min signify possible Chronic Kidney Disease.    Anion gap 10 5 - 15  Ethanol     Status:  Abnormal   Collection Time: 11/27/16  9:35 PM  Result Value Ref Range   Alcohol, Ethyl (B) 60 (H) <5 mg/dL    Comment:        LOWEST DETECTABLE LIMIT FOR SERUM ALCOHOL IS 5 mg/dL FOR MEDICAL PURPOSES ONLY   Salicylate level     Status: None   Collection Time: 11/27/16  9:35 PM  Result Value Ref Range   Salicylate Lvl <2.5 2.8 - 30.0 mg/dL  Acetaminophen level     Status: Abnormal   Collection Time: 11/27/16  9:35 PM  Result Value Ref Range   Acetaminophen (Tylenol), Serum <10 (L) 10 - 30 ug/mL    Comment:        THERAPEUTIC CONCENTRATIONS VARY SIGNIFICANTLY. A RANGE OF 10-30 ug/mL MAY BE AN EFFECTIVE CONCENTRATION FOR MANY PATIENTS. HOWEVER, SOME ARE BEST TREATED AT CONCENTRATIONS OUTSIDE THIS RANGE. ACETAMINOPHEN CONCENTRATIONS >150 ug/mL AT 4 HOURS AFTER INGESTION AND >50 ug/mL AT 12 HOURS AFTER INGESTION ARE OFTEN ASSOCIATED WITH TOXIC REACTIONS.   cbc     Status: Abnormal   Collection Time: 11/27/16  9:35 PM  Result Value Ref Range   WBC 10.8 (H) 4.0 - 10.5 K/uL   RBC 4.87 4.22 - 5.81 MIL/uL   Hemoglobin 15.1 13.0 - 17.0 g/dL   HCT 43.5 39.0 - 52.0 %   MCV 89.3 78.0 - 100.0 fL   MCH 31.0 26.0 - 34.0 pg   MCHC 34.7 30.0 - 36.0 g/dL   RDW 13.0 11.5 - 15.5 %   Platelets 312 150 - 400 K/uL   Blood Alcohol level:  Lab Results  Component Value Date   ETH 60 (H) 11/27/2016   ETH <5 42/70/6237   Metabolic Disorder Labs:  Lab Results  Component Value Date   HGBA1C 6.0 (H) 10/21/2014   MPG 126 (H) 10/21/2014   No results found for: PROLACTIN No results found for: CHOL, TRIG, HDL, CHOLHDL, VLDL, LDLCALC  Current Medications: Current Facility-Administered  Medications  Medication Dose Route Frequency Provider Last Rate Last Dose  . acetaminophen (TYLENOL) tablet 650 mg  650 mg Oral Q6H PRN Encarnacion Slates, NP      . alum & mag hydroxide-simeth (MAALOX/MYLANTA) 200-200-20 MG/5ML suspension 30 mL  30 mL Oral Q4H PRN Encarnacion Slates, NP      . benzonatate (TESSALON) capsule 100 mg  100 mg Oral TID PRN Laverle Hobby, PA-C      . chlordiazePOXIDE (LIBRIUM) capsule 25 mg  25 mg Oral Q6H PRN Encarnacion Slates, NP      . chlordiazePOXIDE (LIBRIUM) capsule 25 mg  25 mg Oral TID Encarnacion Slates, NP   25 mg at 11/29/16 6283   Followed by  . [START ON 11/30/2016] chlordiazePOXIDE (LIBRIUM) capsule 25 mg  25 mg Oral BH-qamhs Encarnacion Slates, NP       Followed by  . [START ON 12/01/2016] chlordiazePOXIDE (LIBRIUM) capsule 25 mg  25 mg Oral Daily Encarnacion Slates, NP      . cloNIDine (CATAPRES) tablet 0.1 mg  0.1 mg Oral QID Encarnacion Slates, NP       Followed by  . [START ON 12/01/2016] cloNIDine (CATAPRES) tablet 0.1 mg  0.1 mg Oral BH-qamhs Encarnacion Slates, NP       Followed by  . [START ON 12/03/2016] cloNIDine (CATAPRES) tablet 0.1 mg  0.1 mg Oral QAC breakfast Encarnacion Slates, NP      . dicyclomine (BENTYL) tablet 20 mg  20 mg  Oral Q6H PRN Encarnacion Slates, NP      . feeding supplement (ENSURE ENLIVE) (ENSURE ENLIVE) liquid 237 mL  237 mL Oral BID BM Myer Peer Cobos, MD   237 mL at 11/28/16 1441  . gabapentin (NEURONTIN) capsule 800 mg  800 mg Oral QID Patrecia Pour, NP   800 mg at 11/29/16 9562  . guaiFENesin (MUCINEX) 12 hr tablet 1,200 mg  1,200 mg Oral BID PRN Laverle Hobby, PA-C      . hydrOXYzine (ATARAX/VISTARIL) tablet 25 mg  25 mg Oral Q6H PRN Encarnacion Slates, NP   25 mg at 11/29/16 1014  . loperamide (IMODIUM) capsule 2-4 mg  2-4 mg Oral PRN Encarnacion Slates, NP      . magnesium hydroxide (MILK OF MAGNESIA) suspension 30 mL  30 mL Oral Daily PRN Encarnacion Slates, NP      . methocarbamol (ROBAXIN) tablet 500 mg  500 mg Oral Q8H PRN Encarnacion Slates, NP      . multivitamin with  minerals tablet 1 tablet  1 tablet Oral Daily Encarnacion Slates, NP   1 tablet at 11/29/16 1308  . naproxen (NAPROSYN) tablet 500 mg  500 mg Oral BID PRN Encarnacion Slates, NP      . nicotine (NICODERM CQ - dosed in mg/24 hours) patch 21 mg  21 mg Transdermal Daily Jenne Campus, MD   21 mg at 11/29/16 0800  . ondansetron (ZOFRAN-ODT) disintegrating tablet 4 mg  4 mg Oral Q6H PRN Encarnacion Slates, NP   4 mg at 11/29/16 1014  . pantoprazole (PROTONIX) EC tablet 40 mg  40 mg Oral Q0600 Patrecia Pour, NP   40 mg at 11/29/16 6578  . sertraline (ZOLOFT) tablet 100 mg  100 mg Oral Daily Patrecia Pour, NP   100 mg at 11/29/16 4696  . thiamine (VITAMIN B-1) tablet 100 mg  100 mg Oral Daily Encarnacion Slates, NP   100 mg at 11/29/16 2952  . traZODone (DESYREL) tablet 50 mg  50 mg Oral QHS,MR X 1 Laverle Hobby, PA-C   50 mg at 11/28/16 2109   PTA Medications: Prescriptions Prior to Admission  Medication Sig Dispense Refill Last Dose  . gabapentin (NEURONTIN) 400 MG capsule Take 2 capsules (800 mg total) by mouth 4 (four) times daily. 240 capsule 0 11/27/2016 at Unknown time  . Ledipasvir-Sofosbuvir (HARVONI) 90-400 MG TABS Take 1 tablet by mouth daily. (Patient not taking: Reported on 07/25/2015) 28 tablet 2 Completed Course at Unknown time  . pantoprazole (PROTONIX) 40 MG tablet Take 1 tablet (40 mg total) by mouth daily at 6 (six) AM. (Patient not taking: Reported on 05/24/2015) 30 tablet 0 Completed Course at Unknown time  . sertraline (ZOLOFT) 100 MG tablet Take 1 tablet (100 mg total) by mouth daily. (Patient not taking: Reported on 11/27/2016) 30 tablet 0 Not Taking at Unknown time   Musculoskeletal: Strength & Muscle Tone: within normal limits Gait & Station: normal Patient leans: N/A  Psychiatric Specialty Exam: Physical Exam  Constitutional: He appears well-developed.  HENT:  Head: Normocephalic.  Eyes: Pupils are equal, round, and reactive to light.  Neck: Normal range of motion.  Cardiovascular:  Normal rate.   Respiratory: Effort normal.  GI: Soft.  Genitourinary:  Genitourinary Comments: Denies any issues in this area.  Musculoskeletal: Normal range of motion.  Neurological: He is alert.  Skin: Skin is warm.    Review of Systems  Constitutional: Positive  for chills, diaphoresis and malaise/fatigue.  HENT: Negative.   Eyes: Positive for blurred vision.  Respiratory: Negative.   Cardiovascular: Negative.   Gastrointestinal: Positive for abdominal pain and nausea.  Genitourinary: Negative.   Musculoskeletal: Positive for back pain, joint pain and myalgias.  Skin: Negative.        "I feel like my skin is crawling".  Neurological: Positive for tremors, seizures (Hx. alcohol withdrawal seizures.) and headaches.  Endo/Heme/Allergies: Negative.   Psychiatric/Behavioral: Positive for depression, substance abuse (UDS + for Benzodiazepine, Cocaine & THC, BAL 60) and suicidal ideas (Able to contract for safety on the unit.). Negative for hallucinations and memory loss. The patient is nervous/anxious and has insomnia.     Blood pressure (!) 144/89, pulse 85, temperature 97.7 F (36.5 C), temperature source Oral, resp. rate 16, height 5' 8.75" (1.746 m), weight 89.8 kg (198 lb).Body mass index is 29.45 kg/m.  General Appearance: Disheveled, restless, facial flushing, runny nose.  Eye Contact:  Fair  Speech:  Clear and Coherent and Normal Rate  Volume:  Normal  Mood:  Anxious, Depressed, Hopeless and Worthless  Affect:  Congruent  Thought Process:  Coherent and Descriptions of Associations: Intact  Orientation:  Full (Time, Place, and Person)  Thought Content:  Currently denies any hallucinations, delusional thoughts or paranoia.  Suicidal Thoughts:  Yes.  without intent/plan, able to contract for safety.  Homicidal Thoughts:  Currently denies any thoughts, plans or intent.  Memory:  Immediate;   Good Recent;   Good Remote;   Good  Judgement:  Fair  Insight:  Present   Psychomotor Activity:  Restlessness and Tremor  Concentration:  Concentration: Fair and Attention Span: Fair  Recall:  Good  Fund of Knowledge:  Fair  Language:  Good  Akathisia:  Negative  Handed:  Right  AIMS (if indicated):     Assets:  Communication Skills Desire for Improvement Social Support  ADL's:  Intact  Cognition:  WNL  Sleep:  Number of Hours: 6.75   Treatment Plan/Recommendations: 1. Admit for crisis management and stabilization, estimated length of stay 3-5 days.  2. Medication management to reduce current symptoms to base line and improve the patient's overall level of functioning: See MAR.  3. Treat health problems as indicated.  4. Develop treatment plan to decrease risk of relapse upon discharge and the need for readmission.  5. Psycho-social education regarding relapse prevention and self care.  6. Health care follow up as needed for medical problems.  7. Review, reconcile, and reinstate any pertinent home medications for other health issues where appropriate. 8. Call for consults with hospitalist for any additional specialty patient care services as needed.  Observation Level/Precautions:  15 minute checks  Laboratory:  Per ED, UDS + for Brnzodiazepine, Cocaine, THC & BAL 60  Psychotherapy: Group sessions  Medications: See Mar  Consultations: As needed   Discharge Concerns: Safety, mood control, maintain sobriety.  Estimated LOS: 2-4 days  Other:  Admit to the [facility].   Physician Treatment Plan for Primary Diagnosis: Polysubstance dependence (including Opioid drugs) with physiological dependence: Will initiate medication management for drug/alcohol detoxification/mood stability. Set up an outpatient psychiatric services for medication management. Will encourage medication adherence with psychiatric medications.  Long Term Goal(s): Improvement in symptoms so as ready for discharge  Short Term Goals: Ability to identify changes in lifestyle to reduce  recurrence of condition will improve, Ability to disclose and discuss suicidal ideas and Ability to identify and develop effective coping behaviors will improve  Physician Treatment  Plan for Secondary Diagnosis: Bipolar affective disorder.  Long Term Goal(s): Improvement in symptoms so as ready for discharge  Short Term Goals: Ability to identify changes in lifestyle to reduce recurrence of condition will improve, Ability to verbalize feelings will improve, Ability to identify and develop effective coping behaviors will improve and Compliance with prescribed medications will improve  I certify that inpatient services furnished can reasonably be expected to improve the patient's condition.    Encarnacion Slates, NP, PMHNP, FNP-BC 2/8/201811:58 AM

## 2016-11-29 NOTE — [facility] Suicide Risk Assessment (Signed)
[facility]   Nursing information obtained from:  Patient, Review of record Demographic factors:  Male, Caucasian, Living alone, Access to firearms (separated from wife) Current Mental Status:  Suicidal ideation indicated by patient, Suicide plan, Plan includes specific time, place, or method, Self-harm thoughts, Intention to act on suicide plan, Belief that plan would result in death Loss Factors:  Decrease in vocational status, Loss of significant relationship, Financial problems / change in socioeconomic status Historical Factors:  Family history of mental illness or substance abuse, Impulsivity Risk Reduction Factors:  Sense of responsibility to family, Positive social support, Positive therapeutic relationship  Total Time spent with patient: 15 minutes Principal Problem: <principal problem not specified> Diagnosis:   Patient Active Problem List   Diagnosis Date Noted  . ETOH abuse [F10.10]   . Cirrhosis (HCC) [K74.60] 05/24/2015  . Suicidal ideation [R45.851]   . Recurrent cellulitis of lower leg [L03.119] 11/09/2014  . Venous (peripheral) insufficiency [I87.2] 11/09/2014  . Bipolar affective disorder (HCC) [F31.9]   . Alcohol dependence with alcohol-induced mood disorder (HCC) [F10.24]   . Opioid dependence with opioid-induced mood disorder (HCC) [F11.24]   . Severe recurrent major depression without psychotic features (HCC) [F33.2] 09/09/2014  . MDD (major depressive disorder) [F32.9] 09/09/2014  . Suicidal ideations [R45.851]   . Chronic pain syndrome [G89.4] 05/31/2014  . Tobacco dependence [F17.200] 05/30/2014  . Poor dentition [K08.9] 02/03/2014  . Hepatitis C virus infection without hepatic coma [B19.20] 02/03/2014  . Sepsis (HCC) [A41.9] 01/29/2014  . Anxiety state, unspecified [F41.1] 01/31/2013  . Mood disorder in conditions classified elsewhere [F06.30] 01/31/2013  . Polysubstance (including opioids) dependence with physiological dependence  (HCC) [F19.20] 01/27/2013    Class: Acute  . Alcohol withdrawal (HCC) [F10.239] 01/27/2013    Class: Acute   Subjective Data: Discussed patient recent alcohol and benzodiazepine use and behaviors.  He is requesting detox, but with unclear goals about the future or desire to abstain.  Attempted to discuss further with patient but he reports constitutional symptoms and seems avoidant of discussion.  Spent time aligning with him in terms of safe detox.  He shares he has been drinking heavily in addition to using 8-10 mg of xanax daily, along with subutex 5-2 twice daily.  He currently has some tremulousness but no nausea, vomiting.  He denies any current SI or thoughts to hurt self/others while in hospital. No AVH or evidence of RTIS.  No data per [facility]  Continued Clinical Symptoms:  Alcohol Use Disorder Identification Test Final Score (AUDIT): 35 The "Alcohol Use Disorders Identification Test", Guidelines for Use in Primary Care, Second Edition.  World Science writerHealth Organization [facility](WHO). Score between 0-7:  no or low risk or alcohol related problems. Score between 8-15:  moderate risk of alcohol related problems. Score between 16-19:  high risk of alcohol related problems. Score 20 or above:  warrants further diagnostic evaluation for alcohol dependence and treatment.   CLINICAL FACTORS:   Severe Anxiety and/or Agitation Depression:   Anhedonia Comorbid alcohol abuse/dependence Hopelessness Impulsivity Insomnia Alcohol/Substance Abuse/Dependencies  Musculoskeletal: Strength & Muscle Tone: within normal limits Gait & Station: normal Patient leans: N/A  Psychiatric Specialty Exam: Physical Exam  Review of Systems  Constitutional: Positive for chills.  Eyes: Positive for photophobia.  Gastrointestinal: Positive for nausea.  Musculoskeletal: Positive for myalgias.  Neurological: Positive for weakness.  Psychiatric/Behavioral: Positive for depression, substance abuse and suicidal ideas.  All  other systems reviewed and are negative.   Blood pressure (!) 144/89, pulse 85,  temperature 97.7 F (36.5 C), temperature source Oral, resp. rate 16, height 5' 8.75" (1.746 m), weight 89.8 kg (198 lb).Body mass index is 29.45 kg/m.  General Appearance: Disheveled  Eye Contact:  Minimal  Speech:  Garbled and Normal Rate  Volume:  Decreased  Mood:  Depressed and Dysphoric  Affect:  Congruent and Depressed  Thought Process:  Linear  Orientation:  Full (Time, Place, and Person)  Thought Content:  Logical  Suicidal Thoughts:  Yes.  without intent/plan  Homicidal Thoughts:  No  Memory:  Immediate;   Fair  Judgement:  Intact  Insight:  Shallow  Psychomotor Activity:  Normal  Concentration:  Concentration: Fair  Recall:  [employer] of Knowledge:  Fair  Language:  Fair  Akathisia:  Negative  Handed:    AIMS (if indicated):     Assets:  Communication Skills  ADL's:  Intact  Cognition:  WNL  Sleep:  Number of Hours: 6.75   COGNITIVE FEATURES THAT CONTRIBUTE TO RISK:  None    SUICIDE RISK:   Moderate:  Frequent suicidal ideation with limited intensity, and duration, some specificity in terms of plans, no associated intent, good self-control, limited dysphoria/symptomatology, some risk factors present, and identifiable protective factors, including available and accessible social support.  PLAN OF CARE: Continue plan of care as per H&P, continue detox protocol, and begin disposition planning with patient and SW.  I certify that inpatient services furnished can reasonably be expected to improve the patient's condition.   Burnard Leigh, MD 11/29/2016, 2:58 PM
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
## 2016-11-29 NOTE — Plan of Care (Signed)
Problem: Safety: Goal: Periods of time without injury will increase Outcome: Progressing Pt. remains a low fall risk, denies SI/HI/AVH at this time, Q 15 checks in place.    

## 2016-11-29 NOTE — [facility] Group Notes (Signed)
[facility] LCSW Group Therapy  11/29/2016 2:56 PM  Type of Therapy:  Group Therapy  Participation Level:  Did Not Attend-pt invited. Chose to rest in room.   Summary of Progress/Problems: MHA Speaker came to talk about his personal journey with substance abuse and addiction. The pt processed ways by which to relate to the speaker. MHA speaker provided handouts and educational information pertaining to groups and services offered by [facility].   Thunder [place] N Smart LCSW 11/29/2016, 2:56 PM

## 2016-11-29 NOTE — Progress Notes (Signed)
  DATA ACTION RESPONSE  Objective- Pt. is up and visible in the milieu, seen watching TV and pacing the halls from time to time. Pt. presents with an anxious affect and mood. Subjective- Denies having any SI/HI/AVH at this time. Rates pain 9/10; generalized. Pt. states " I just don't feel good all over". Pt. continues to be cooperative and remain safe on the unit.  1:1 interaction in private to establish rapport. Encouragement, education, & support given from staff. Meds. ordered and administered. PRN Librium, Vistaril, Robaxin, and Naproxen, requested and will re-eval accordingly.   Safety maintained with Q 15 checks. Continues to follow treatment plan and will monitor closely. No additonal questions/concerns noted.

## 2016-11-29 NOTE — Progress Notes (Signed)
D: [name] HiddenGary denied SI ("not while I'm here"), HI, and AVH in conversation this a.m. On his self inventory, he admitted he has thoughts of self-harm, but he does contract for safety. He has complained of S&S of withdrawal with partial relief from Librium and PRNs (see MAR). He has complained of nausea unrelieved by Zofran. He has been able to drink fluids and eat some lunch. He has not attended groups. Pt becomes much more visibly tremulous when he is aware he is being observed for such.   A: Meds given as ordered. Q15 safety checks maintained. Support/encouragement offered.  R: Pt remains free from harm and continues with treatment. Will continue to monitor for needs/safety.

## 2016-11-29 NOTE — Progress Notes (Signed)
NUTRITION ASSESSMENT  Pt identified as at risk on the Malnutrition Screen Tool  INTERVENTION: 1. Supplements: Continue Ensure Enlive po BID, each supplement provides 350 kcal and 20 grams of protein  NUTRITION DIAGNOSIS: Unintentional weight loss related to sub-optimal intake as evidenced by pt report.   Goal: Pt to meet >/= 90% of their estimated nutrition needs.  Monitor:  PO intake  Assessment:  Pt admitted with polysubstance abuse (THC, cocaine, benzoes, opiates) and ETOH use (a fifth + pint daily). Pt currently with withdrawal symptoms. Pt reports weight loss of 25 lb x a few months (this is a an approximate 11% weight loss which is significant for time frame of a few months. Per chart review, pt has lost 38 lb since August 2016, this is insignificant for time frame. Pt would benefit from nutritional supplementation given substance abuse and current withdrawal symptoms. Ensure supplements have been ordered.  Height: Ht Readings from Last 1 Encounters:  11/28/16 5' 8.75" (1.746 m)    Weight: Wt Readings from Last 1 Encounters:  11/28/16 198 lb (89.8 kg)    Weight Hx: Wt Readings from Last 10 Encounters:  11/28/16 198 lb (89.8 kg)  05/24/15 236 lb (107 kg)  03/10/15 238 lb (108 kg)  11/23/14 250 lb 11.2 oz (113.7 kg)  11/09/14 254 lb (115.2 kg)  10/25/14 251 lb (113.9 kg)  10/21/14 220 lb (99.8 kg)  10/13/14 220 lb (99.8 kg)  09/17/14 220 lb (99.8 kg)  06/01/14 241 lb 11.2 oz (109.6 kg)    BMI:  Body mass index is 29.45 kg/m. Pt meets criteria for overweight based on current BMI.  Estimated Nutritional Needs: Kcal: 25-30 kcal/kg Protein: > 1 gram protein/kg Fluid: 1 ml/kcal  Diet Order:   Pt is also offered choice of unit snacks mid-morning and mid-afternoon.  Pt is eating as desired.   Lab results and medications reviewed.   Tilda FrancoLindsey Slayton Lubitz, MS, RD, LDN Pager: [phone] After Hours Pager: [phone]

## 2016-11-29 NOTE — Progress Notes (Signed)
Adult Psychoeducational Group Note  Date:  11/29/2016 Time:  9:01 PM  Group Topic/Focus:  Wrap-Up Group:   The focus of this group is to help patients review their daily goal of treatment and discuss progress on daily workbooks.  Participation Level:  Active  Participation Quality:  Appropriate  Affect:  Appropriate  Cognitive:  Appropriate  Insight: Appropriate  Engagement in Group:  Improving  Modes of Intervention:  Discussion  Additional Comments: The patient attend wrap up group and expressed that his day went fine.  Octavio Mannshigpen, Ginia Rudell Lee 11/29/2016, 9:01 PM

## 2016-11-29 NOTE — Tx Team (Signed)
Interdisciplinary Treatment and Diagnostic Plan Update  11/30/2016 Time of Session: 9:30AM [name] PennerGary M [name] MRN: [id number]  Principal Diagnosis: Polysubstance dependence (including Opioid drugs) with physiological dependence  Secondary Diagnoses: Active Problems:   Polysubstance (including opioids) dependence with physiological dependence (HCC)   Alcohol withdrawal (HCC)   Suicidal ideations   Severe recurrent major depression without psychotic features (HCC)   Alcohol dependence with alcohol-induced mood disorder (HCC)   Opioid dependence with opioid-induced mood disorder (HCC)   Current Medications:  Current Facility-Administered Medications  Medication Dose Route Frequency Provider Last Rate Last Dose  . acetaminophen (TYLENOL) tablet 650 mg  650 mg Oral Q6H PRN Sanjuana KavaAgnes I Nwoko, NP      . alum & mag hydroxide-simeth (MAALOX/MYLANTA) 200-200-20 MG/5ML suspension 30 mL  30 mL Oral Q4H PRN Sanjuana KavaAgnes I Nwoko, NP      . benzonatate (TESSALON) capsule 100 mg  100 mg Oral TID PRN Kerry HoughSpencer E Simon, PA-C      . chlordiazePOXIDE (LIBRIUM) capsule 25 mg  25 mg Oral Q6H PRN Sanjuana KavaAgnes I Nwoko, NP   25 mg at 11/30/16 [id number]  . chlordiazePOXIDE (LIBRIUM) capsule 25 mg  25 mg Oral BH-qamhs Sanjuana KavaAgnes I Nwoko, NP   25 mg at 11/30/16 0858   Followed by  . [START ON 12/01/2016] chlordiazePOXIDE (LIBRIUM) capsule 25 mg  25 mg Oral Daily Sanjuana KavaAgnes I Nwoko, NP      . cloNIDine (CATAPRES) tablet 0.1 mg  0.1 mg Oral QID Sanjuana KavaAgnes I Nwoko, NP   0.1 mg at 11/30/16 0856   Followed by  . [START ON 12/01/2016] cloNIDine (CATAPRES) tablet 0.1 mg  0.1 mg Oral BH-qamhs Sanjuana KavaAgnes I Nwoko, NP       Followed by  . [START ON 12/03/2016] cloNIDine (CATAPRES) tablet 0.1 mg  0.1 mg Oral QAC breakfast Sanjuana KavaAgnes I Nwoko, NP      . dicyclomine (BENTYL) tablet 20 mg  20 mg Oral Q6H PRN Sanjuana KavaAgnes I Nwoko, NP      . feeding supplement (ENSURE ENLIVE) (ENSURE ENLIVE) liquid 237 mL  237 mL Oral BID BM Rockey SituFernando A Cobos, MD   237 mL at 11/28/16 1441  . gabapentin  (NEURONTIN) capsule 800 mg  800 mg Oral QID Charm RingsJamison Y Lord, NP   800 mg at 11/30/16 0856  . guaiFENesin (MUCINEX) 12 hr tablet 1,200 mg  1,200 mg Oral BID PRN Kerry HoughSpencer E Simon, PA-C      . hydrOXYzine (ATARAX/VISTARIL) tablet 25 mg  25 mg Oral Q6H PRN Sanjuana KavaAgnes I Nwoko, NP   25 mg at 11/29/16 1944  . loperamide (IMODIUM) capsule 2-4 mg  2-4 mg Oral PRN Sanjuana KavaAgnes I Nwoko, NP      . magnesium hydroxide (MILK OF MAGNESIA) suspension 30 mL  30 mL Oral Daily PRN Sanjuana KavaAgnes I Nwoko, NP      . methocarbamol (ROBAXIN) tablet 500 mg  500 mg Oral Q8H PRN Sanjuana KavaAgnes I Nwoko, NP   500 mg at 11/30/16 0900  . multivitamin with minerals tablet 1 tablet  1 tablet Oral Daily Sanjuana KavaAgnes I Nwoko, NP   1 tablet at 11/30/16 0857  . naproxen (NAPROSYN) tablet 500 mg  500 mg Oral BID PRN Sanjuana KavaAgnes I Nwoko, NP   500 mg at 11/30/16 0900  . nicotine (NICODERM CQ - dosed in mg/24 hours) patch 21 mg  21 mg Transdermal Daily Craige CottaFernando A Cobos, MD   21 mg at 11/30/16 0855  . ondansetron (ZOFRAN-ODT) disintegrating tablet 4 mg  4 mg Oral Q6H PRN Sanjuana KavaAgnes I Nwoko,  NP   4 mg at 11/29/16 1812  . pantoprazole (PROTONIX) EC tablet 40 mg  40 mg Oral Q0600 Charm Rings, NP   40 mg at 11/30/16 8119  . QUEtiapine (SEROQUEL) tablet 100 mg  100 mg Oral QHS Sanjuana Kava, NP   100 mg at 11/29/16 2118  . sertraline (ZOLOFT) tablet 100 mg  100 mg Oral Daily Charm Rings, NP   100 mg at 11/30/16 0856  . thiamine (VITAMIN B-1) tablet 100 mg  100 mg Oral Daily Sanjuana Kava, NP   100 mg at 11/30/16 0856  . traZODone (DESYREL) tablet 50 mg  50 mg Oral QHS,MR X 1 Kerry Hough, PA-C   50 mg at 11/29/16 2225   PTA Medications: Prescriptions Prior to Admission  Medication Sig Dispense Refill Last Dose  . gabapentin (NEURONTIN) 400 MG capsule Take 2 capsules (800 mg total) by mouth 4 (four) times daily. 240 capsule 0 11/27/2016 at Unknown time  . Ledipasvir-Sofosbuvir (HARVONI) 90-400 MG TABS Take 1 tablet by mouth daily. (Patient not taking: Reported on 07/25/2015) 28 tablet  2 Completed Course at Unknown time  . pantoprazole (PROTONIX) 40 MG tablet Take 1 tablet (40 mg total) by mouth daily at 6 (six) AM. (Patient not taking: Reported on 05/24/2015) 30 tablet 0 Completed Course at Unknown time  . sertraline (ZOLOFT) 100 MG tablet Take 1 tablet (100 mg total) by mouth daily. (Patient not taking: Reported on 11/27/2016) 30 tablet 0 Not Taking at Unknown time    Patient Stressors: Financial difficulties Marital or family conflict Medication change or noncompliance Occupational concerns Substance abuse  Patient Strengths: Ability for insight Average or above average intelligence Communication skills General fund of knowledge Motivation for treatment/growth Physical Health Supportive family/friends Work skills  Treatment Modalities: Medication Management, Group therapy, Case management,  1 to 1 session with clinician, Psychoeducation, Recreational therapy.   Physician Treatment Plan for Primary Diagnosis: Polysubstance dependence (including Opioid drugs) with physiological dependence Long Term Goal(s): Improvement in symptoms so as ready for discharge Improvement in symptoms so as ready for discharge   Short Term Goals: Ability to identify changes in lifestyle to reduce recurrence of condition will improve Ability to disclose and discuss suicidal ideas Ability to identify and develop effective coping behaviors will improve Ability to identify changes in lifestyle to reduce recurrence of condition will improve Ability to verbalize feelings will improve Ability to identify and develop effective coping behaviors will improve Compliance with prescribed medications will improve  Medication Management: Evaluate patient's response, side effects, and tolerance of medication regimen.  Therapeutic Interventions: 1 to 1 sessions, Unit Group sessions and Medication administration.  Evaluation of Outcomes: Progressing  Physician Treatment Plan for Secondary  Diagnosis: Active Problems:   Polysubstance (including opioids) dependence with physiological dependence (HCC)   Alcohol withdrawal (HCC)   Suicidal ideations   Severe recurrent major depression without psychotic features (HCC)   Alcohol dependence with alcohol-induced mood disorder (HCC)   Opioid dependence with opioid-induced mood disorder (HCC)  Long Term Goal(s): Improvement in symptoms so as ready for discharge Improvement in symptoms so as ready for discharge   Short Term Goals: Ability to identify changes in lifestyle to reduce recurrence of condition will improve Ability to disclose and discuss suicidal ideas Ability to identify and develop effective coping behaviors will improve Ability to identify changes in lifestyle to reduce recurrence of condition will improve Ability to verbalize feelings will improve Ability to identify and develop effective coping behaviors will  improve Compliance with prescribed medications will improve     Medication Management: Evaluate patient's response, side effects, and tolerance of medication regimen.  Therapeutic Interventions: 1 to 1 sessions, Unit Group sessions and Medication administration.  Evaluation of Outcomes: Progressing   RN Treatment Plan for Primary Diagnosis: Polysubstance dependence (including Opioid drugs) with physiological dependence Long Term Goal(s): Knowledge of disease and therapeutic regimen to maintain health will improve  Short Term Goals: Ability to remain free from injury will improve, Ability to verbalize feelings will improve and Ability to disclose and discuss suicidal ideas  Medication Management: RN will administer medications as ordered by provider, will assess and evaluate patient's response and provide education to patient for prescribed medication. RN will report any adverse and/or side effects to prescribing provider.  Therapeutic Interventions: 1 on 1 counseling sessions, Psychoeducation, Medication  administration, Evaluate responses to treatment, Monitor vital signs and CBGs as ordered, Perform/monitor CIWA, COWS, AIMS and Fall Risk screenings as ordered, Perform wound care treatments as ordered.  Evaluation of Outcomes: Progressing   LCSW Treatment Plan for Primary Diagnosis: Polysubstance dependence (including Opioid drugs) with physiological dependence Long Term Goal(s): Safe transition to appropriate next level of care at discharge, Engage patient in therapeutic group addressing interpersonal concerns.  Short Term Goals: Engage patient in aftercare planning with referrals and resources, Facilitate patient progression through stages of change regarding substance use diagnoses and concerns and Identify triggers associated with mental health/substance abuse issues  Therapeutic Interventions: Assess for all discharge needs, 1 to 1 time with Social worker, Explore available resources and support systems, Assess for adequacy in community support network, Educate family and significant other(s) on suicide prevention, Complete Psychosocial Assessment, Interpersonal group therapy.  Evaluation of Outcomes: Progressing   Progress in Treatment: Attending groups: Yes. Participating in groups: Yes. Taking medication as prescribed: Yes. Toleration medication: Yes. Family/Significant other contact made: SPE completed with pt; pt declined to consent to family contact.  Patient understands diagnosis: Yes. Discussing patient identified problems/goals with staff: Yes. Medical problems stabilized or resolved: Yes. Denies suicidal/homicidal ideation: No. Passive SI/able to contract for safety on the unit.  Issues/concerns per patient self-inventory: No. Other: n/a  New problem(s) identified: No, Describe:  n/a  New Short Term/Long Term Goal(s): detox; medication stabilization; development of comprehensive mental wellness/sobriety plan.   Discharge Plan or Barriers: CSW assessing for appropriate  referrals. Pt's last admission to [facility] was 08/2014 where he was referred to [facility] for substance abuse inpatient treatment. 11/30/16--pt requested daymark screening--scheduled for: Thursday   12/06/16 at 1145AM. [facility] Mixer for outpatient mental health services.   Reason for Continuation of Hospitalization: Depression Medication stabilization Suicidal ideation Withdrawal symptoms  Estimated Length of Stay: 3-5 days   Attendees: Patient: 11/30/2016 9:47 AM  Physician: Dr. Rene Kocher MD 11/30/2016 9:47 AM  Nursing: Ned Card RN 11/30/2016 9:47 AM  RN Care Manager: Onnie Boer CM 11/30/2016 9:47 AM  Social Worker: Trula Slade, LCSW 11/30/2016 9:47 AM  Recreational Therapist: x 11/30/2016 9:47 AM  Other: Armandina Stammer NP; Gray Bernhardt NP 11/30/2016 9:47 AM  Other:  11/30/2016 9:47 AM  Other: 11/30/2016 9:47 AM    Scribe for Treatment Team: Ledell Peoples Smart, LCSW 11/30/2016 9:47 AM

## 2016-11-30 DIAGNOSIS — F332 Major depressive disorder, recurrent severe without psychotic features: Secondary | ICD-10-CM

## 2016-11-30 DIAGNOSIS — F17210 Nicotine dependence, cigarettes, uncomplicated: Secondary | ICD-10-CM

## 2016-11-30 NOTE — Plan of Care (Signed)
Problem: Activity: Goal: Interest or engagement in activities will improve Outcome: Progressing Pt. attended AA meeting this evening.

## 2016-11-30 NOTE — Progress Notes (Signed)
[facility] MD Progress Note  11/30/2016 1:00 PM [name] [name]  MRN:  [id number] Subjective:  Spoke with multiple times throughout the day.  He appears to be much improved from yesterday in terms of withdrawal symptoms.  Ambulating well and taking PO fluids/food.  He does have some tremulousness but this does appear to be reduced from yesterday.  Received 125 mg of librium yesterday, and has received 50 mg today thus far.  He shares that he is wanting to receive substance treatment as outpatient after detox, and has coordinated with SW to arrange this.  He shares that his mood has continued to be quite depressed and he wishes to restart his home wellbutrin for depression, which has been helpful in the past.  His home dose was 300 mg, discussed restarting at 150 mg xl here given detox status.  He was able to get some sleep last evening.  Had one episode of vomiting overnight.  He denies any active suicidal thoughts or plans at this time.  Principal Problem: Alcohol withdrawal (HCC) Diagnosis:   Patient Active Problem List   Diagnosis Date Noted  . ETOH abuse [F10.10]   . Cirrhosis (HCC) [K74.60] 05/24/2015  . Suicidal ideation [R45.851]   . Recurrent cellulitis of lower leg [L03.119] 11/09/2014  . Venous (peripheral) insufficiency [I87.2] 11/09/2014  . Bipolar affective disorder (HCC) [F31.9]   . Alcohol dependence with alcohol-induced mood disorder (HCC) [F10.24]   . Opioid dependence with opioid-induced mood disorder (HCC) [F11.24]   . Severe recurrent major depression without psychotic features (HCC) [F33.2] 09/09/2014  . MDD (major depressive disorder) [F32.9] 09/09/2014  . Suicidal ideations [R45.851]   . Chronic pain syndrome [G89.4] 05/31/2014  . Tobacco dependence [F17.200] 05/30/2014  . Poor dentition [K08.9] 02/03/2014  . Hepatitis C virus infection without hepatic coma [B19.20] 02/03/2014  . Sepsis (HCC) [A41.9] 01/29/2014  . Anxiety state, unspecified [F41.1] 01/31/2013  . Mood disorder in  conditions classified elsewhere [F06.30] 01/31/2013  . 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 Alcohol withdrawal (HCC) [F10.239] 01/27/2013    Class: Acute   Total Time spent with patient: 20 minutes  Past Psychiatric History: See H&P for full details  Past Medical History:  Past Medical History:  Diagnosis Date  . Bipolar disorder (HCC)   . Cellulitis   . Depression   . Hepatitis C     Past Surgical History:  Procedure Laterality Date  . BACK SURGERY    . ROTATOR CUFF REPAIR    . SHOULDER SURGERY     Family History:  Family History  Problem Relation Age of Onset  . Alcohol abuse Father   . Cancer Maternal Aunt    Family Psychiatric  History: See H&P for full details  Social History:  History  Alcohol Use  . 7.2 oz/week  . 12 Cans of beer per week    Comment: heavy     History  Drug Use No    Comment: denies at this time    Social History   Social History  . Marital status: Divorced    Spouse name: N/A  . Number of children: N/A  . Years of education: N/A   Social History Main Topics  . Smoking status: Current Every Day Smoker    Packs/day: 1.00    Types: Cigarettes    Start date: 10/22/1981  . Smokeless tobacco: Never Used  . Alcohol use 7.2 oz/week    12 Cans of beer  per week     Comment: heavy  . Drug use: No     Comment: denies at this time  . Sexual activity: Not Currently   Other Topics Concern  . None   Social History Narrative  . None   Additional Social History:   Sleep: Fair  Appetite:  Fair  Current Medications: Current Facility-Administered Medications  Medication Dose Route Frequency Provider Last Rate Last Dose  . acetaminophen (TYLENOL) tablet 650 mg  650 mg Oral Q6H PRN Sanjuana KavaAgnes I Nwoko, NP   650 mg at 11/30/16 0953  . alum & mag hydroxide-simeth (MAALOX/MYLANTA) 200-200-20 MG/5ML suspension 30 mL  30 mL Oral Q4H PRN Sanjuana KavaAgnes I Nwoko, NP      .  benzonatate (TESSALON) capsule 100 mg  100 mg Oral TID PRN Kerry HoughSpencer E Simon, PA-C      . chlordiazePOXIDE (LIBRIUM) capsule 25 mg  25 mg Oral Q6H PRN Sanjuana KavaAgnes I Nwoko, NP   25 mg at 11/30/16 1258  . chlordiazePOXIDE (LIBRIUM) capsule 25 mg  25 mg Oral BH-qamhs Sanjuana KavaAgnes I Nwoko, NP   25 mg at 11/30/16 0858   Followed by  . [START ON 12/01/2016] chlordiazePOXIDE (LIBRIUM) capsule 25 mg  25 mg Oral Daily Sanjuana KavaAgnes I Nwoko, NP      . cloNIDine (CATAPRES) tablet 0.1 mg  0.1 mg Oral QID Sanjuana KavaAgnes I Nwoko, NP   0.1 mg at 11/30/16 1212   Followed by  . [START ON 12/01/2016] cloNIDine (CATAPRES) tablet 0.1 mg  0.1 mg Oral BH-qamhs Sanjuana KavaAgnes I Nwoko, NP       Followed by  . [START ON 12/03/2016] cloNIDine (CATAPRES) tablet 0.1 mg  0.1 mg Oral QAC breakfast Sanjuana KavaAgnes I Nwoko, NP      . dicyclomine (BENTYL) tablet 20 mg  20 mg Oral Q6H PRN Sanjuana KavaAgnes I Nwoko, NP      . feeding supplement (ENSURE ENLIVE) (ENSURE ENLIVE) liquid 237 mL  237 mL Oral BID BM Rockey SituFernando A Cobos, MD   237 mL at 11/28/16 1441  . gabapentin (NEURONTIN) capsule 800 mg  800 mg Oral QID Charm RingsJamison Y Lord, NP   800 mg at 11/30/16 1215  . guaiFENesin (MUCINEX) 12 hr tablet 1,200 mg  1,200 mg Oral BID PRN Kerry HoughSpencer E Simon, PA-C      . hydrOXYzine (ATARAX/VISTARIL) tablet 25 mg  25 mg Oral Q6H PRN Sanjuana KavaAgnes I Nwoko, NP   25 mg at 11/30/16 0953  . loperamide (IMODIUM) capsule 2-4 mg  2-4 mg Oral PRN Sanjuana KavaAgnes I Nwoko, NP      . magnesium hydroxide (MILK OF MAGNESIA) suspension 30 mL  30 mL Oral Daily PRN Sanjuana KavaAgnes I Nwoko, NP      . methocarbamol (ROBAXIN) tablet 500 mg  500 mg Oral Q8H PRN Sanjuana KavaAgnes I Nwoko, NP   500 mg at 11/30/16 0900  . multivitamin with minerals tablet 1 tablet  1 tablet Oral Daily Sanjuana KavaAgnes I Nwoko, NP   1 tablet at 11/30/16 0857  . naproxen (NAPROSYN) tablet 500 mg  500 mg Oral BID PRN Sanjuana KavaAgnes I Nwoko, NP   500 mg at 11/30/16 0900  . nicotine (NICODERM CQ - dosed in mg/24 hours) patch 21 mg  21 mg Transdermal Daily Craige CottaFernando A Cobos, MD   21 mg at 11/30/16 0855  . ondansetron  (ZOFRAN-ODT) disintegrating tablet 4 mg  4 mg Oral Q6H PRN Sanjuana KavaAgnes I Nwoko, NP   4 mg at 11/30/16 0953  . pantoprazole (PROTONIX) EC tablet 40 mg  40 mg Oral Q0600  Charm Rings, NP   40 mg at 11/30/16 1610  . QUEtiapine (SEROQUEL) tablet 100 mg  100 mg Oral QHS Sanjuana Kava, NP   100 mg at 11/29/16 2118  . sertraline (ZOLOFT) tablet 100 mg  100 mg Oral Daily Charm Rings, NP   100 mg at 11/30/16 0856  . thiamine (VITAMIN B-1) tablet 100 mg  100 mg Oral Daily Sanjuana Kava, NP   100 mg at 11/30/16 0856  . traZODone (DESYREL) tablet 50 mg  50 mg Oral QHS,MR X 1 Kerry Hough, PA-C   50 mg at 11/29/16 2225    Lab Results: No results found for this or any previous visit (from the past 48 hour(s)).  Blood Alcohol level:  Lab Results  Component Value Date   ETH 60 (H) 11/27/2016   ETH <5 07/25/2015    Metabolic Disorder Labs: Lab Results  Component Value Date   HGBA1C 6.0 (H) 10/21/2014   MPG 126 (H) 10/21/2014   No results found for: PROLACTIN No results found for: CHOL, TRIG, HDL, CHOLHDL, VLDL, LDLCALC  Physical Findings: AIMS: Facial and Oral Movements Muscles of Facial Expression: None, normal Lips and Perioral Area: None, normal Jaw: None, normal Tongue: None, normal,Extremity Movements Upper (arms, wrists, hands, fingers): None, normal Lower (legs, knees, ankles, toes): None, normal, Trunk Movements Neck, shoulders, hips: None, normal, Overall Severity Severity of abnormal movements (highest score from questions above): None, normal Incapacitation due to abnormal movements: None, normal Patient's awareness of abnormal movements (rate only patient's report): No Awareness, Dental Status Current problems with teeth and/or dentures?: No Does patient usually wear dentures?: No  CIWA:  CIWA-Ar Total: 11 COWS:  COWS Total Score: 9  Musculoskeletal: Strength & Muscle Tone: within normal limits Gait & Station: normal Patient leans: N/A  Psychiatric Specialty  Exam: Physical Exam  ROS  Blood pressure 116/68, pulse 88, temperature 98.4 F (36.9 C), temperature source Oral, resp. rate 16, height 5' 8.75" (1.746 m), weight 89.8 kg (198 lb).Body mass index is 29.45 kg/m.  General Appearance: Casual and Disheveled  Eye Contact:  Good  Speech:  Clear and Coherent  Volume:  Normal  Mood:  Anxious and Depressed  Affect:  Appropriate and Congruent  Thought Process:  Coherent and Goal Directed  Orientation:  Full (Time, Place, and Person)  Thought Content:  Logical  Suicidal Thoughts:  No  Homicidal Thoughts:  No  Memory:  Recent;   Good  Judgement:  Impaired  Insight:  Present and Shallow  Psychomotor Activity:  Normal  Concentration:  Concentration: Fair  Recall:  NA  Fund of Knowledge:  Fair  Language:  Fair  Akathisia:  Negative  Handed:  Right  AIMS (if indicated):     Assets:  Desire for Improvement Housing Intimacy Vocational/Educational  ADL's:  Intact  Cognition:  WNL  Sleep:  Number of Hours: 6.75    Treatment Plan Summary: Daily contact with patient to assess and evaluate symptoms and progress in treatment and Medication management  Continue to engage in individual and group therapies as available  # Alcohol use disorder Continue with Librium taper, following CIWA for monitoring; he received 125 mg librium yesterday, so goal dosing for today would be approximately 75-100 mg (has received 50 mg thusfar), factoring in the use of prn medications - Continue Mv, thiamine, and prn medications for constitutional symptoms  # MDD Will reconsider wellbutrin as he is further into his librium taper; continue trazodone and zoloft for depressive symptoms  and insomnia  # Disposition - Will continue to coordinate with SW and patient in dispo planning and Substance treatment options as outpatient  Burnard Leigh, MD 11/30/2016, 1:00 PM

## 2016-11-30 NOTE — Progress Notes (Signed)
Nursing Note 11/30/2016 [id number]  Data Reports sleeping fair with PRN sleep med.  Rates depression 9/10, hopelessness 10/10, and anxiety 10/10. Affect labile.  Denies HI, SI, AVH.  C/O ongoing back pain.  Up at desk requesting many PRN's.  C/O intermittent nausea, back pain, anxiety, stomach cramping..  Requested librium before lunch, did not score for PRN librium, and cramping. Patient notified he could not receive and started yelling "You're a horrible nurse! Everyone else gives me it, you just decide I can't have it?"  Patient demanded to speak to charge nurse who reinforced what this nurse explained.    Action Spoke with patient 1:1, nurse offered support to patient throughout shift.  Patient did score 11 on CIWA after lunch and received PRN.  Given PRN robaxin, naproxen, vistaril, bentyl throughout day.  Spoke with patient about frequency of PRN requests, discussed how it could be potentially part of his addictive pathology.  Nurse explained to patient that he will give patient what he needs to be comfortable, and if I hold a medicine "it isn't because I won't give it to you, it's because I can't."  Continues to be monitored on 15 minute checks for safety.  Response Patient very resistant, agitated, and demanding in AM.  Apologized this afternoon for behavior before lunch.  After speaking with patient about PRN frequency and relating it to addiction pathology patient receptive and understanding stating "maybe I ask for a lot of stuff because I'm an addict." Remains safe on unit.  More appropriate and less agitated in evening.

## 2016-11-30 NOTE — [facility] Group Notes (Signed)
[facility] LCSW Group Therapy  11/30/2016 3:14 PM  Type of Therapy:  Group Therapy  Participation Level:  None  Participation Quality:  Inattentive  Affect:  Anxious, Depressed and Flat  Cognitive:  Lacking  Insight:  Poor  Engagement in Therapy:  None  Modes of Intervention:  Confrontation, Discussion, Education, Exploration, Problem-solving, Socialization and Support  Summary of Progress/Problems:Self Sabotage members were invited to explore various examples of self sabatoge and how this behavior affects their recovery and potential for relapse. Group members were asked to identify examples of self sabatoge in their most recent mental health/substance abuse relapse. Jillyn HiddenGary did not wish to participate in group discussion and presented as restless/anxious during group. He got up and left the group room several times but would come back after a few minutes to apologize. At the end of group, Jillyn HiddenGary explained that his stomach was upset due to withdrawals, which is why he had to continuously leave group.   Yehudis Monceaux N Smart LCSW 11/30/2016, 3:14 PM

## 2016-11-30 NOTE — Progress Notes (Signed)
Patient attended AA group meeting.  

## 2016-11-30 NOTE — Progress Notes (Addendum)
  DATA ACTION RESPONSE  Objective- Pt. is up and visible in the milieu, seen pacing the halls and shifting movement constantly. Pt. presents with an agitated/irritable/pained affect and mood. Pt. appears to be needy and med-seeking with staff. With interaction, Pt. would present hands to show excessive shakiness and ask if it is time for PRN librium. Pt. is routinely attentive to somatic compliants to staff. Subjective- Denies having any SI/HI/AVH at this time. Rates pain Pt. states " 9/10; Generalized. Pt. states " I just don't feel good all over".  Pt. continues to be safe on the unit.  1:1 interaction in private to establish rapport. Encouragement, education, & support given from staff. Meds. ordered and administered. PRN Naproxen and Librium requested and will re-eval accordingly. BP/HR  was stable. Pt. was encourage to push fluids; Gatorade given. Last CIWA @ 12.   Safety maintained with Q 15 checks. Continues to follow treatment plan and will monitor closely. No additonal questions/concerns noted.

## 2016-11-30 NOTE — Progress Notes (Signed)
Recreation Therapy Notes  Date: 11/30/16 Time: 0930 Location: 300 Hall Dayroom  Group Topic: Stress Management  Goal Area(s) Addresses:  Patient will verbalize importance of using healthy stress management.  Patient will identify positive emotions associated with healthy stress management.   Behavioral Response: Engaged  Intervention: Stress Management  Activity :  [employer] operational officereaceful Place.  LRT introduced the stress management technique of guided imagery.  LRT read [name] script to allow patients to engage in [name] "mental vacation".  Patients were to follow along as LRT read script.  Education:  Stress Management, Discharge Planning.   Education Outcome: Acknowledges edcuation/In group clarification offered/Needs additional education  Clinical Observations/Feedback: Pt attended group.   [name] [name], [name] [name]     [name] [name], [name] [name] 11/30/2016 12:03 PM

## 2016-11-30 NOTE — [facility] Suicide Risk Assessment (Signed)
[facility] INPATIENT:  Family/Significant Other Suicide Prevention Education  Suicide Prevention Education:  Patient Refusal for Family/Significant Other Suicide Prevention Education: The patient [name] [name] has refused to provide written consent for family/significant other to be provided Family/Significant Other Suicide Prevention Education during admission and/or prior to discharge.  Physician notified.  SPE completed with pt, as pt refused to consent to family contact. SPI pamphlet provided to pt and pt was encouraged to share information with support network, ask questions, and talk about any concerns relating to SPE. Pt denies access to guns/firearms and verbalized understanding of information provided. Mobile Crisis information also provided to pt.   Jaquelinne Glendening N Smart LCSW 11/30/2016, 9:49 AM

## 2016-12-01 DIAGNOSIS — F10230 Alcohol dependence with withdrawal, uncomplicated: Secondary | ICD-10-CM

## 2016-12-01 DIAGNOSIS — F1124 Opioid dependence with opioid-induced mood disorder: Secondary | ICD-10-CM

## 2016-12-01 MED ORDER — QUETIAPINE FUMARATE ER 50 MG PO TB24
ORAL_TABLET | ORAL | Status: AC
  Filled 2016-12-01: qty 1

## 2016-12-01 MED ORDER — LOPERAMIDE HCL 2 MG PO CAPS: 2.0000 mg | ORAL_CAPSULE | ORAL | Status: DC | PRN

## 2016-12-01 MED ORDER — HYDROXYZINE HCL 50 MG PO TABS
50.0000 mg | ORAL_TABLET | Freq: Four times a day (QID) | ORAL | Status: DC | PRN
  Administered 2016-12-01 – 2016-12-02 (×3): 50 mg via ORAL
  Filled 2016-12-01 (×3): qty 1

## 2016-12-01 MED ORDER — ONDANSETRON 4 MG PO TBDP
4.0000 mg | ORAL_TABLET | Freq: Four times a day (QID) | ORAL | Status: DC | PRN
  Administered 2016-12-01 – 2016-12-03 (×4): 4 mg via ORAL
  Filled 2016-12-01 (×4): qty 1

## 2016-12-01 MED ORDER — PRAZOSIN HCL 1 MG PO CAPS
1.0000 mg | ORAL_CAPSULE | Freq: Every day | ORAL | Status: DC
  Administered 2016-12-01 – 2016-12-02 (×2): 1 mg via ORAL
  Filled 2016-12-01 (×4): qty 1

## 2016-12-01 MED ORDER — CHLORDIAZEPOXIDE HCL 5 MG PO CAPS
10.0000 mg | ORAL_CAPSULE | Freq: Once | ORAL | Status: AC | PRN
  Administered 2016-12-01: 10 mg via ORAL
  Filled 2016-12-01: qty 2

## 2016-12-01 MED ORDER — BISMUTH SUBSALICYLATE 262 MG/15ML PO SUSP
30.0000 mL | ORAL | Status: DC | PRN
  Filled 2016-12-01: qty 118

## 2016-12-01 MED ORDER — NICOTINE POLACRILEX 2 MG MT GUM
2.0000 mg | CHEWING_GUM | OROMUCOSAL | Status: DC | PRN
  Administered 2016-12-02 – 2016-12-04 (×12): 2 mg via ORAL
  Filled 2016-12-01 (×2): qty 1

## 2016-12-01 MED ORDER — QUETIAPINE FUMARATE ER 50 MG PO TB24
50.0000 mg | ORAL_TABLET | Freq: Three times a day (TID) | ORAL | Status: DC | PRN
  Administered 2016-12-01 – 2016-12-02 (×3): 50 mg via ORAL
  Filled 2016-12-01 (×2): qty 1

## 2016-12-01 NOTE — Progress Notes (Signed)
Data. Patient denies HI/AVH. Patient  Does continue to endorse passive SI. "Nothing I can do here, but if you discharged me today I wouldn't be safe." Patient is able to verbally contract for safety on the unit and to come to staff before acting on any self harm thoughts/feelings.  Patient interacting well with other patients. Patient is at the nurses station making multiple requests and C/O, "Severe detox symptoms." Patient C/O nausea, abdominal cramping, diarrhea and tremors. PRNs given as appropriate. Orders received for additional PRN medications. Patient has been very irritable and verbally rude with staff. On his self assessment patient reports 10/10 for anxiety, depression and hopelessness. His goal for today is: "Lower anxiety and depression." Action. Emotional support and encouragement offered. Education provided on medication, indications and side effect. Q 15 minute checks done for safety. Response. Safety on the unit maintained through 15 minute checks.  Medications taken as prescribed. Attended groups.

## 2016-12-01 NOTE — [facility] Group Notes (Signed)
[facility] LCSW Group Therapy Note  12/01/2016  at  10:15 AM  Type of Therapy and Topic:  Group Therapy: Avoiding Self-Sabotaging and Engaging in Radical Acceptance  Participation Level:  Minimal  Participation Quality:  Inattentive  Affect:  Irritable  Cognitive:  Oriented  Insight:  Limited  Engagement in Therapy:  Distracting   Therapeutic models used: Cognitive Behavioral Therapy,  Person-Centered Therapy and Motivational Interviewing  Modes of Intervention:  Discussion, Exploration, Orientation, Rapport Building, Socialization and Support   Summary of Progress/Problems:  The main focus of today's process group was for the patient to identify ways in which they may be able to engage in Radical Acceptance verses self sabotage. Motivational Interviewing was utilized to identify motivation they may have for wanting to change. The group processed multiple fears associated with change including the fear of success. Jillyn HiddenGary was focused on discussing his detox symptoms and required redirection at multiple points to avoid side conversations.   Carney Bernatherine C Harrill, LCSW

## 2016-12-01 NOTE — Progress Notes (Signed)
D.  Pt flushed and sweaty on approach, states he has had a very difficult day with withdrawals.  CIWA 15, NP notified and orders received.  Pt will speak to doctor tomorrow about Librium being re-ordered temporarily.  Pt was positive for wrap up group, observed interacting with peers in dayroom.  Pt denies SI/HI/hallulcinations at this time.  A.  Support and encouragement offered, medication given as ordered  R.  Pt remains safe on the unit, will continue to monitor.

## 2016-12-01 NOTE — Progress Notes (Signed)
[facility] MD Progress Note  12/01/2016 11:24 AM [name] [name]  MRN:  [id number]    Subjective:  Patient reports " I am having really bad anxiety."  States " I feel ready to be restarted back on Wellbutrin.  Objective: [name] [name] is awake, alert and oriented *3, Seen resting in dayroom interacting with peers.  Denies suicidal or homicidal ideation. Denies auditory or visual hallucination and does not appear to be responding to internal stimuli. Per staffing notes patient appears to be medications seeking. Patient reports he is medication compliant without mediation side effects.States his anxiety and depression 10/10 and noting is helping. Support, encouragement and reassurance was provided.   Principal Problem: Alcohol withdrawal (HCC) Diagnosis:   Patient Active Problem List   Diagnosis Date Noted  . ETOH abuse [F10.10]   . Cirrhosis (HCC) [K74.60] 05/24/2015  . Suicidal ideation [R45.851]   . Recurrent cellulitis of lower leg [L03.119] 11/09/2014  . Venous (peripheral) insufficiency [I87.2] 11/09/2014  . Bipolar affective disorder (HCC) [F31.9]   . Alcohol dependence with alcohol-induced mood disorder (HCC) [F10.24]   . Opioid dependence with opioid-induced mood disorder (HCC) [F11.24]   . Severe recurrent major depression without psychotic features (HCC) [F33.2] 09/09/2014  . MDD (major depressive disorder) [F32.9] 09/09/2014  . Suicidal ideations [R45.851]   . Chronic pain syndrome [G89.4] 05/31/2014  . Tobacco dependence [F17.200] 05/30/2014  . Poor dentition [K08.9] 02/03/2014  . Hepatitis C virus infection without hepatic coma [B19.20] 02/03/2014  . Sepsis (HCC) [A41.9] 01/29/2014  . Anxiety state, unspecified [F41.1] 01/31/2013  . Mood disorder in conditions classified elsewhere [F06.30] 01/31/2013  . 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 Alcohol withdrawal (HCC) [F10.239] 01/27/2013    Class:  Acute   Total Time spent with patient: 20 minutes  Past Psychiatric History: See H&P for full details  Past Medical History:  Past Medical History:  Diagnosis Date  . Bipolar disorder (HCC)   . Cellulitis   . Depression   . Hepatitis C     Past Surgical History:  Procedure Laterality Date  . BACK SURGERY    . ROTATOR CUFF REPAIR    . SHOULDER SURGERY     Family History:  Family History  Problem Relation Age of Onset  . Alcohol abuse Father   . Cancer Maternal Aunt    Family Psychiatric  History: See H&P for full details  Social History:  History  Alcohol Use  . 7.2 oz/week  . 12 Cans of beer per week    Comment: heavy     History  Drug Use No    Comment: denies at this time    Social History   Social History  . Marital status: Divorced    Spouse name: N/A  . Number of children: N/A  . Years of education: N/A   Social History Main Topics  . Smoking status: Current Every Day Smoker    Packs/day: 1.00    Types: Cigarettes    Start date: 10/22/1981  . Smokeless tobacco: Never Used  . Alcohol use 7.2 oz/week    12 Cans of beer per week     Comment: heavy  . Drug use: No     Comment: denies at this time  . Sexual activity: Not Currently   Other Topics Concern  . None   Social History Narrative  . None   Additional Social History:   Sleep: Fair  Appetite:  Fair  Current Medications: Current Facility-Administered Medications  Medication Dose Route Frequency Provider Last Rate Last Dose  . acetaminophen (TYLENOL) tablet 650 mg  650 mg Oral Q6H PRN Sanjuana KavaAgnes I Nwoko, NP   650 mg at 12/01/16 [id number]  . alum & mag hydroxide-simeth (MAALOX/MYLANTA) 200-200-20 MG/5ML suspension 30 mL  30 mL Oral Q4H PRN Sanjuana KavaAgnes I Nwoko, NP      . benzonatate (TESSALON) capsule 100 mg  100 mg Oral TID PRN Kerry HoughSpencer E Simon, PA-C      . chlordiazePOXIDE (LIBRIUM) capsule 25 mg  25 mg Oral Q6H PRN Sanjuana KavaAgnes I Nwoko, NP   25 mg at 12/01/16 0608  . chlordiazePOXIDE (LIBRIUM) capsule 25 mg   25 mg Oral Daily Sanjuana KavaAgnes I Nwoko, NP      . cloNIDine (CATAPRES) tablet 0.1 mg  0.1 mg Oral BH-qamhs Sanjuana KavaAgnes I Nwoko, NP   0.1 mg at 12/01/16 0757   Followed by  . [START ON 12/03/2016] cloNIDine (CATAPRES) tablet 0.1 mg  0.1 mg Oral QAC breakfast Sanjuana KavaAgnes I Nwoko, NP      . dicyclomine (BENTYL) tablet 20 mg  20 mg Oral Q6H PRN Sanjuana KavaAgnes I Nwoko, NP   20 mg at 12/01/16 [id number]  . feeding supplement (ENSURE ENLIVE) (ENSURE ENLIVE) liquid 237 mL  237 mL Oral BID BM Rockey SituFernando A Cobos, MD   237 mL at 11/28/16 1441  . gabapentin (NEURONTIN) capsule 800 mg  800 mg Oral QID Charm RingsJamison Y Lord, NP   800 mg at 12/01/16 0801  . guaiFENesin (MUCINEX) 12 hr tablet 1,200 mg  1,200 mg Oral BID PRN Kerry HoughSpencer E Simon, PA-C      . hydrOXYzine (ATARAX/VISTARIL) tablet 25 mg  25 mg Oral Q6H PRN Sanjuana KavaAgnes I Nwoko, NP   25 mg at 12/01/16 0853  . loperamide (IMODIUM) capsule 2-4 mg  2-4 mg Oral PRN Sanjuana KavaAgnes I Nwoko, NP      . magnesium hydroxide (MILK OF MAGNESIA) suspension 30 mL  30 mL Oral Daily PRN Sanjuana KavaAgnes I Nwoko, NP      . methocarbamol (ROBAXIN) tablet 500 mg  500 mg Oral Q8H PRN Sanjuana KavaAgnes I Nwoko, NP   500 mg at 12/01/16 0801  . multivitamin with minerals tablet 1 tablet  1 tablet Oral Daily Sanjuana KavaAgnes I Nwoko, NP   1 tablet at 12/01/16 0758  . naproxen (NAPROSYN) tablet 500 mg  500 mg Oral BID PRN Sanjuana KavaAgnes I Nwoko, NP   500 mg at 12/01/16 0801  . nicotine (NICODERM CQ - dosed in mg/24 hours) patch 21 mg  21 mg Transdermal Daily Craige CottaFernando A Cobos, MD   21 mg at 12/01/16 0758  . ondansetron (ZOFRAN-ODT) disintegrating tablet 4 mg  4 mg Oral Q6H PRN Sanjuana KavaAgnes I Nwoko, NP   4 mg at 12/01/16 0853  . pantoprazole (PROTONIX) EC tablet 40 mg  40 mg Oral Q0600 Charm RingsJamison Y Lord, NP   40 mg at 12/01/16 0608  . QUEtiapine (SEROQUEL) tablet 100 mg  100 mg Oral QHS Sanjuana KavaAgnes I Nwoko, NP   100 mg at 11/30/16 2140  . sertraline (ZOLOFT) tablet 100 mg  100 mg Oral Daily Charm RingsJamison Y Lord, NP   100 mg at 12/01/16 0758  . thiamine (VITAMIN B-1) tablet 100 mg  100 mg Oral Daily Sanjuana KavaAgnes I  Nwoko, NP   100 mg at 12/01/16 0758  . traZODone (DESYREL) tablet 50 mg  50 mg Oral QHS,MR X 1 Kerry HoughSpencer E Simon, PA-C   50 mg at 11/30/16 2140    Lab Results: No  results found for this or any previous visit (from the past 48 hour(s)).  Blood Alcohol level:  Lab Results  Component Value Date   ETH 60 (H) 11/27/2016   ETH <5 07/25/2015    Metabolic Disorder Labs: Lab Results  Component Value Date   HGBA1C 6.0 (H) 10/21/2014   MPG 126 (H) 10/21/2014   No results found for: PROLACTIN No results found for: CHOL, TRIG, HDL, CHOLHDL, VLDL, LDLCALC  Physical Findings: AIMS: Facial and Oral Movements Muscles of Facial Expression: None, normal Lips and Perioral Area: None, normal Jaw: None, normal Tongue: None, normal,Extremity Movements Upper (arms, wrists, hands, fingers): None, normal Lower (legs, knees, ankles, toes): None, normal, Trunk Movements Neck, shoulders, hips: None, normal, Overall Severity Severity of abnormal movements (highest score from questions above): None, normal Incapacitation due to abnormal movements: None, normal Patient's awareness of abnormal movements (rate only patient's report): No Awareness, Dental Status Current problems with teeth and/or dentures?: No Does patient usually wear dentures?: No  CIWA:  CIWA-Ar Total: 12 COWS:  COWS Total Score: 7  Musculoskeletal: Strength & Muscle Tone: within normal limits Gait & Station: normal Patient leans: N/A  Psychiatric Specialty Exam: Physical Exam  Nursing note and vitals reviewed. Constitutional: He is oriented to person, place, and time. He appears well-developed.  Neurological: He is alert and oriented to person, place, and time.  Skin: Skin is warm and dry.    Review of Systems  Psychiatric/Behavioral: Positive for depression, substance abuse and suicidal ideas. The patient is nervous/anxious.     Blood pressure 125/65, pulse 64, temperature 97.7 F (36.5 C), resp. rate 16, height 5' 8.75"  (1.746 m), weight 89.8 kg (198 lb).Body mass index is 29.45 kg/m.  General Appearance: Disheveled  Eye Contact:  Good  Speech:  Clear and Coherent  Volume:  Normal  Mood:  Anxious and Depressed  Affect:  Appropriate and Congruent  Thought Process:  Goal Directed  Orientation:  Full (Time, Place, and Person)  Thought Content:  Logical and Hallucinations: None  Suicidal Thoughts:  No  Homicidal Thoughts:  No  Memory:  Recent;   Good  Judgement:  Impaired  Insight:  Lacking  Psychomotor Activity:  Normal  Concentration:  Concentration: Fair  Recall:  NA  Fund of Knowledge:  Fair  Language:  Fair  Akathisia:  Negative  Handed:  Right  AIMS (if indicated):     Assets:  Desire for Improvement Housing Intimacy Resilience Vocational/Educational  ADL's:  Intact  Cognition:  WNL  Sleep:  Number of Hours: 6.75     I agree with current treatment plan on 12/01/2016, Patient seen face-to-face for psychiatric evaluation follow-up, chart reviewed.  Reviewed the information documented and agree with the treatment plan.   Treatment Plan Summary: Daily contact with patient to assess and evaluate symptoms and progress in treatment and Medication management   Continue treatment plan as listed below 12/01/2016. Except where noted.   Continue to engage in individual and group therapies as available  # Alcohol use disorder Continue with Librium taper, following CIWA for monitoring; he received 125 mg librium yesterday, so goal dosing for today would be approximately 75-100 mg (has received 50 mg thusfar), factoring in the use of prn medications  - Continue Mv, thiamine, and prn medications for constitutional symptoms  # MDD Will reconsider wellbutrin as he is further into his librium taper; continue trazodone, minipress and zoloft for depressive symptoms and insomnia  #Anxiety Start Seroquel 50mg  PO Q6 PRN   #  Disposition - Will continue to coordinate with SW and patient in dispo  planning and Substance treatment options as outpatient  Oneta Rack, NP 12/01/2016, 11:24 AM

## 2016-12-01 NOTE — Plan of Care (Signed)
Problem: Education: Goal: Knowledge of disease or condition will improve Outcome: Not Progressing Patient is showing minimal improvement in insight into his illness.

## 2016-12-01 NOTE — Progress Notes (Signed)
[facility] Group Notes:  (Nursing/MHT/Case Management/Adjunct)  Date:  12/01/2016  Time:  10:28 PM  Type of Therapy:  Psychoeducational Skills  Participation Level:  Active  Participation Quality:  Appropriate  Affect:  Blunted and Lethargic  Cognitive:  Appropriate  Insight:  Appropriate  Engagement in Group:  Developing/Improving  Modes of Intervention:  Education  Summary of Progress/Problems: The patient expressed in group that he had a bad day overall since he is detoxing from drugs. He states that he has quite a bit to think about in his head but did not elaborate. His goal for tomorrow is to feel better and to speak with his doctor.   [name] [name], [name] 12/01/2016, 10:28 PM

## 2016-12-01 NOTE — [facility] Group Notes (Signed)
[facility] Group Notes:  (Nursing/MHT/Case Management/Adjunct)  Date:  12/01/2016  Time:  2:19 PM  Type of Therapy:  Nurse Education  Participation Level:  Minimal  Participation Quality:  Inattentive  Affect:  Anxious  Cognitive:  Lacking  Insight:  Lacking  Engagement in Group:  Lacking  Modes of Intervention:  Education  Summary of Progress/Problems:  Patient rated his energy level as 3/10. Minimal interaction. Discussion of communication styles.   Almira Barenny G Lowen Mansouri 12/01/2016, 2:19 PM

## 2016-12-02 MED ORDER — QUETIAPINE FUMARATE 50 MG PO TABS
50.0000 mg | ORAL_TABLET | Freq: Two times a day (BID) | ORAL | Status: DC
  Administered 2016-12-02 – 2016-12-03 (×2): 50 mg via ORAL
  Filled 2016-12-02 (×5): qty 1

## 2016-12-02 MED ORDER — CHLORDIAZEPOXIDE HCL 5 MG PO CAPS
10.0000 mg | ORAL_CAPSULE | Freq: Once | ORAL | Status: AC | PRN
  Administered 2016-12-02: 10 mg via ORAL
  Filled 2016-12-02: qty 2

## 2016-12-02 MED ORDER — CLONIDINE HCL 0.1 MG PO TABS
0.1000 mg | ORAL_TABLET | Freq: Once | ORAL | Status: AC
  Administered 2016-12-02: 0.1 mg via ORAL

## 2016-12-02 NOTE — Progress Notes (Signed)
D:  Patient awake and alert;  he denies suicidal and homicidal ideation and AVH; no self-injurious behaviors noted or reported. Patient irritable A:  Medications given as scheduled;  Emotional support provided; encouraged him to seek assistance with needs/concerns. R:  Safety maintained on unit.

## 2016-12-02 NOTE — Progress Notes (Signed)
[facility] MD Progress Note  12/02/2016 12:21 PM [name] [name]  MRN:  [id number] Subjective:  I still feel anxious and nervous.  I think a higher dose of clonidine will help.  Objective; Patient seen chart reviewed.  Patient seen easily intrusive, irritable and he continued to demand more medication.  He is taking hydroxyzine, clonidine, Seroquel and last night started Minipress for his nightmares.  He sleeping better.  He still has mild tremors but so far tolerating his medication without any side effects.  He has seen multiple times at nursing station requesting more medication.  Though he is redirectable but requires a lot of redirections.  Patient finished his Librium but he feels he need something to help his anxiety.  Denies any hallucination or any paranoia.  Though he is going to groups but at times disruptive.  Principal Problem: Alcohol withdrawal (HCC) Diagnosis:   Patient Active Problem List   Diagnosis Date Noted  . ETOH abuse [F10.10]   . Cirrhosis (HCC) [K74.60] 05/24/2015  . Suicidal ideation [R45.851]   . Recurrent cellulitis of lower leg [L03.119] 11/09/2014  . Venous (peripheral) insufficiency [I87.2] 11/09/2014  . Bipolar affective disorder (HCC) [F31.9]   . Alcohol dependence with alcohol-induced mood disorder (HCC) [F10.24]   . Opioid dependence with opioid-induced mood disorder (HCC) [F11.24]   . Severe recurrent major depression without psychotic features (HCC) [F33.2] 09/09/2014  . MDD (major depressive disorder) [F32.9] 09/09/2014  . Suicidal ideations [R45.851]   . Chronic pain syndrome [G89.4] 05/31/2014  . Tobacco dependence [F17.200] 05/30/2014  . Poor dentition [K08.9] 02/03/2014  . Hepatitis C virus infection without hepatic coma [B19.20] 02/03/2014  . Sepsis (HCC) [A41.9] 01/29/2014  . Anxiety state, unspecified [F41.1] 01/31/2013  . Mood disorder in conditions classified elsewhere [F06.30] 01/31/2013  . Polysubstance (including opioids) dependence with  physiological dependence (HCC) [F19.20] 01/27/2013    Class: Acute  . Alcohol withdrawal (HCC) [F10.239] 01/27/2013    Class: Acute   Total Time spent with patient: 30 minutes  Past Psychiatric History: Reviewed.  Past Medical History:  Past Medical History:  Diagnosis Date  . Bipolar disorder (HCC)   . Cellulitis   . Depression   . Hepatitis C     Past Surgical History:  Procedure Laterality Date  . BACK SURGERY    . ROTATOR CUFF REPAIR    . SHOULDER SURGERY     Family History:  Family History  Problem Relation Age of Onset  . Alcohol abuse Father   . Cancer Maternal Aunt    Family Psychiatric  History: Reviewed. Social History:  History  Alcohol Use  . 7.2 oz/week  . 12 Cans of beer per week    Comment: heavy     History  Drug Use No    Comment: denies at this time    Social History   Social History  . Marital status: Divorced    Spouse name: N/A  . Number of children: N/A  . Years of education: N/A   Social History Main Topics  . Smoking status: Current Every Day Smoker    Packs/day: 1.00    Types: Cigarettes    Start date: 10/22/1981  . Smokeless tobacco: Never Used  . Alcohol use 7.2 oz/week    12 Cans of beer per week     Comment: heavy  . Drug use: No     Comment: denies at this time  . Sexual activity: Not Currently   Other Topics Concern  . None   Social  History Narrative  . None   Additional Social History:                         Sleep: Fair  Appetite:  Fair  Current Medications: Current Facility-Administered Medications  Medication Dose Route Frequency Provider Last Rate Last Dose  . acetaminophen (TYLENOL) tablet 650 mg  650 mg Oral Q6H PRN Sanjuana Kava, NP   650 mg at 12/02/16 0946  . alum & mag hydroxide-simeth (MAALOX/MYLANTA) 200-200-20 MG/5ML suspension 30 mL  30 mL Oral Q4H PRN Sanjuana Kava, NP      . bismuth subsalicylate (PEPTO BISMOL) 262 MG/15ML suspension 30 mL  30 mL Oral Q4H PRN Oneta Rack, NP       . cloNIDine (CATAPRES) tablet 0.1 mg  0.1 mg Oral BH-qamhs Sanjuana Kava, NP   0.1 mg at 12/02/16 0747   Followed by  . [START ON 12/03/2016] cloNIDine (CATAPRES) tablet 0.1 mg  0.1 mg Oral QAC breakfast Sanjuana Kava, NP      . dicyclomine (BENTYL) tablet 20 mg  20 mg Oral Q6H PRN Sanjuana Kava, NP   20 mg at 12/02/16 1124  . feeding supplement (ENSURE ENLIVE) (ENSURE ENLIVE) liquid 237 mL  237 mL Oral BID BM Rockey Situ Cobos, MD   237 mL at 11/28/16 1441  . gabapentin (NEURONTIN) capsule 800 mg  800 mg Oral QID Charm Rings, NP   800 mg at 12/02/16 1124  . loperamide (IMODIUM) capsule 2-4 mg  2-4 mg Oral PRN Oneta Rack, NP      . magnesium hydroxide (MILK OF MAGNESIA) suspension 30 mL  30 mL Oral Daily PRN Sanjuana Kava, NP      . methocarbamol (ROBAXIN) tablet 500 mg  500 mg Oral Q8H PRN Sanjuana Kava, NP   500 mg at 12/02/16 0440  . multivitamin with minerals tablet 1 tablet  1 tablet Oral Daily Sanjuana Kava, NP   1 tablet at 12/02/16 0748  . naproxen (NAPROSYN) tablet 500 mg  500 mg Oral BID PRN Sanjuana Kava, NP   500 mg at 12/02/16 4010  . nicotine polacrilex (NICORETTE) gum 2 mg  2 mg Oral PRN Craige Cotta, MD   2 mg at 12/02/16 1124  . ondansetron (ZOFRAN-ODT) disintegrating tablet 4 mg  4 mg Oral Q6H PRN Oneta Rack, NP   4 mg at 12/02/16 1124  . pantoprazole (PROTONIX) EC tablet 40 mg  40 mg Oral Q0600 Charm Rings, NP   40 mg at 12/02/16 0630  . prazosin (MINIPRESS) capsule 1 mg  1 mg Oral QHS Oneta Rack, NP   1 mg at 12/01/16 2058  . QUEtiapine (SEROQUEL) tablet 100 mg  100 mg Oral QHS Sanjuana Kava, NP   100 mg at 12/01/16 2302  . QUEtiapine (SEROQUEL) tablet 50 mg  50 mg Oral BID Cleotis Nipper, MD      . sertraline (ZOLOFT) tablet 100 mg  100 mg Oral Daily Charm Rings, NP   100 mg at 12/02/16 0748  . thiamine (VITAMIN B-1) tablet 100 mg  100 mg Oral Daily Sanjuana Kava, NP   100 mg at 12/02/16 0748  . traZODone (DESYREL) tablet 50 mg  50 mg Oral QHS,MR X 1  Kerry Hough, PA-C   50 mg at 12/01/16 2353    Lab Results: No results found for this or any previous visit (  from the past 48 hour(s)).  Blood Alcohol level:  Lab Results  Component Value Date   ETH 60 (H) 11/27/2016   ETH <5 07/25/2015    Metabolic Disorder Labs: Lab Results  Component Value Date   HGBA1C 6.0 (H) 10/21/2014   MPG 126 (H) 10/21/2014   No results found for: PROLACTIN No results found for: CHOL, TRIG, HDL, CHOLHDL, VLDL, LDLCALC  Physical Findings: AIMS: Facial and Oral Movements Muscles of Facial Expression: None, normal Lips and Perioral Area: None, normal Jaw: None, normal Tongue: None, normal,Extremity Movements Upper (arms, wrists, hands, fingers): None, normal Lower (legs, knees, ankles, toes): None, normal, Trunk Movements Neck, shoulders, hips: None, normal, Overall Severity Severity of abnormal movements (highest score from questions above): None, normal Incapacitation due to abnormal movements: None, normal Patient's awareness of abnormal movements (rate only patient's report): No Awareness, Dental Status Current problems with teeth and/or dentures?: No Does patient usually wear dentures?: No  CIWA:  CIWA-Ar Total: 12 COWS:  COWS Total Score: 10  Musculoskeletal: Strength & Muscle Tone: within normal limits Gait & Station: normal Patient leans: N/A  Psychiatric Specialty Exam: Physical Exam  Review of Systems  Constitutional: Negative.   Skin: Negative.   Neurological: Positive for tremors.  Psychiatric/Behavioral: The patient is nervous/anxious and has insomnia.     Blood pressure 136/68, pulse 76, temperature 97.6 F (36.4 C), resp. rate 16, height 5' 8.75" (1.746 m), weight 89.8 kg (198 lb).Body mass index is 29.45 kg/m.  General Appearance: Disheveled  Eye Contact:  Fair  Speech:  Slow  Volume:  Normal  Mood:  Anxious and Irritable  Affect:  Labile  Thought Process:  Goal Directed  Orientation:  Full (Time, Place, and  Person)  Thought Content:  Rumination  Suicidal Thoughts:  No  Homicidal Thoughts:  No  Memory:  Immediate;   Fair Recent;   Fair Remote;   Fair  Judgement:  Fair  Insight:  Fair  Psychomotor Activity:  Mild tremors in his hands  Concentration:  Concentration: Fair and Attention Span: Fair  Recall:  Good  Fund of Knowledge:  Good  Language:  Good  Akathisia:  No  Handed:  Right  AIMS (if indicated):     Assets:  Communication Skills Desire for Improvement  ADL's:  Intact  Cognition:  WNL  Sleep:  Number of Hours: 6     Treatment Plan Summary: Patient remains anxious, labile and despite taking multiple medication.  Depressed.  He has continued to struggles detoxing from opiates and alcohol.  He still have mild tremors.  We will add clonidine 1 dose extra to help his withdrawal symptoms.  We discussed in length about his medication Regina.  He is taking multiple medication but so far tolerating well.  I will discontinue Vistaril since he does not see any improvement.  We will start Seroquel 50 mg standing dose and he will continue to take 100 mg at bedtime.  He was started Minipress yesterday and he has seen some improvement in his nightmares.  Patient was told about hospital policy that he cannot stand at nursing Center all the time.  Patient was able to understand and agree to follow the routine and protocol. Labs reviewed, CBC normal except WBC content 0.8, his complete has a metabolic panel is normal.  We will order hemoglobin A1c. Encouraged to participate in group milieu therapy. Social worker to start disposition and substance treatment option.    Rondalyn Belford T., MD 12/02/2016, 12:21 PM

## 2016-12-02 NOTE — Plan of Care (Signed)
Problem: Safety: Goal: Periods of time without injury will increase Outcome: Progressing Safety maintained on unit  Problem: Self-Concept: Goal: Ability to disclose and discuss suicidal ideas will improve Outcome: Progressing Patient denies suicidal ideation

## 2016-12-02 NOTE — [facility] Group Notes (Signed)
[facility] LCSW Group Therapy Note   12/02/2016  10:15  AM   Type of Therapy and Topic: Group Therapy: Feelings Around Returning Home & Establishing a Supportive Framework and Activity to Identify signs of Improvement or Decompensation   Participation Level: Active   Description of Group:  Patients first processed thoughts and feelings about up coming discharge. These included fears of upcoming changes, lack of change, new living environments, judgements and expectations from others and overall stigma of MH issues. We then discussed what is a supportive framework? What does it look like feel like and how do I discern it from and unhealthy non-supportive network? Learn how to cope when supports are not helpful and don't support you. Discuss what to do when your family/friends are not supportive.   Therapeutic Goals Addressed in Processing Group:  1. Patient will identify one healthy supportive network that they can use at discharge. 2. Patient will identify one factor of a supportive framework and how to tell it from an unhealthy network. 3. Patient able to identify one coping skill to use when they do not have positive supports from others. 4. Patient will demonstrate ability to communicate their needs through discussion and/or role plays.  Summary of Patient Progress:  Pt engaged easily during group session. As patients processed their anxiety about discharge and described healthy supports patient required redirection at multiple points to avoid side conversations.  Patient chose a visual to represent decompensation as a fish hook due to his addiction hooking him in multiple times and improvement as a person who appeared to be at piece with himself. Patient became irritable when facilitator did not call on people in what he felt to be organized manner.  Carney Bernatherine C Seila Liston, LCSW

## 2016-12-02 NOTE — Progress Notes (Signed)
The patient attended this evening's A.A.meeting and was appropriate.  

## 2016-12-03 DIAGNOSIS — F332 Major depressive disorder, recurrent severe without psychotic features: Secondary | ICD-10-CM | POA: Diagnosis present

## 2016-12-03 MED ORDER — QUETIAPINE FUMARATE 100 MG PO TABS: 100.0000 mg | ORAL_TABLET | Freq: Two times a day (BID) | ORAL | Status: DC

## 2016-12-03 MED ORDER — QUETIAPINE FUMARATE 50 MG PO TABS
50.0000 mg | ORAL_TABLET | Freq: Two times a day (BID) | ORAL | Status: DC
  Administered 2016-12-03 – 2016-12-04 (×2): 50 mg via ORAL
  Filled 2016-12-03: qty 84
  Filled 2016-12-03 (×4): qty 1
  Filled 2016-12-03: qty 84

## 2016-12-03 MED ORDER — BUPROPION HCL ER (XL) 150 MG PO TB24
150.0000 mg | ORAL_TABLET | Freq: Every day | ORAL | Status: DC
  Administered 2016-12-04: 150 mg via ORAL
  Filled 2016-12-03: qty 21
  Filled 2016-12-03 (×2): qty 1

## 2016-12-03 MED ORDER — QUETIAPINE FUMARATE 100 MG PO TABS
100.0000 mg | ORAL_TABLET | Freq: Every day | ORAL | Status: DC
  Administered 2016-12-03: 100 mg via ORAL
  Filled 2016-12-03 (×3): qty 1

## 2016-12-03 MED ORDER — TRAZODONE HCL 100 MG PO TABS
100.0000 mg | ORAL_TABLET | Freq: Every day | ORAL | Status: DC
  Administered 2016-12-03: 100 mg via ORAL
  Filled 2016-12-03: qty 1
  Filled 2016-12-03: qty 21
  Filled 2016-12-03: qty 1

## 2016-12-03 MED ORDER — HYDROXYZINE HCL 50 MG PO TABS
50.0000 mg | ORAL_TABLET | Freq: Three times a day (TID) | ORAL | Status: DC | PRN
  Administered 2016-12-03 – 2016-12-04 (×3): 50 mg via ORAL
  Filled 2016-12-03: qty 1
  Filled 2016-12-03: qty 20
  Filled 2016-12-03 (×2): qty 1

## 2016-12-03 MED ORDER — PRAZOSIN HCL 2 MG PO CAPS
2.0000 mg | ORAL_CAPSULE | Freq: Every day | ORAL | Status: DC
  Administered 2016-12-03: 2 mg via ORAL
  Filled 2016-12-03: qty 21
  Filled 2016-12-03 (×2): qty 1
  Filled 2016-12-03: qty 2

## 2016-12-03 MED ORDER — SERTRALINE HCL 50 MG PO TABS
150.0000 mg | ORAL_TABLET | Freq: Every day | ORAL | Status: DC
  Administered 2016-12-04: 150 mg via ORAL
  Filled 2016-12-03 (×3): qty 3

## 2016-12-03 NOTE — [facility] Group Notes (Signed)
[facility] LCSW Group Therapy  12/03/2016 1:30 to 2:30 PM  Type of Therapy:  Group Therapy Overcoming Obstacles   Participation Level:  Active  Participation Quality:  Intrusive, Monopolizing, Redirectable and Sharing  Affect:  Flat, Irritable and Lethargic  Cognitive:  Oriented  Insight:  Limited  Engagement in Therapy:  Limited  Modes of Intervention:  Clarification, Discussion, Problem-solving, Rapport Building, Socialization and Support  Summary of Progress/Problems:  Group discussion focused on what patient's see as their own obstacles to recovery. Multiple patients shared frustrations they experience with "the system" and what they see as injustices. Patient shared belief that discharging without prolonged inpatient treatment will be difficult to deal with due to his desire to drink. Patient was argumentative at multiple points and used several curse words.     Carney Bernatherine C Bernece Gall, LCSW

## 2016-12-03 NOTE — Progress Notes (Signed)
[facility] Group Notes:  (Nursing/MHT/Case Management/Adjunct)  Date:  12/03/2016  Time:  10:42 PM  Type of Therapy:  Psychoeducational Skills  Participation Level:  Active  Participation Quality:  Appropriate  Affect:  Depressed and Flat  Cognitive:  Appropriate  Insight:  Improving  Engagement in Group:  Developing/Improving  Modes of Intervention:  Education  Summary of Progress/Problems: Patient states that he will be discharged tomorrow. He did not sound very optimistic about being discharged and mentioned that he has attended substance abuse programs on multiple occasions without any success. He acknowledges that he needs to "talk less" and address his lack of sleep. As for the theme of the day, his wellness strategy will be to exercise more.   Hazle CocaGOODMAN, Shaden Higley S 12/03/2016, 10:42 PM

## 2016-12-03 NOTE — Progress Notes (Signed)
[facility] MD Progress Note  12/03/2016 11:20 AM [name] [name]  MRN:  [id number]   Subjective:  Spent time with patient reviewing mood, sleep symptoms.  He remains with some passive suicidal thoughts and feels hopeless off and on throughout the day. Remains with irritability, and anhedonia. Sleep was poor last night with prazosin, feels that the dose was too low.  He continues with nightmares related to [employer].  He has a slightly improved appetite.  Is planning to go to the gym today and walk to get some exercise/movement.  Spent time discussing benefits of exercise regimen with patient.    Principal Problem: Alcohol withdrawal (HCC) Diagnosis:   Patient Active Problem List   Diagnosis Date Noted  . Major depressive disorder, recurrent severe without psychotic features (HCC) [F33.2] 12/03/2016  . ETOH abuse [F10.10]   . Cirrhosis (HCC) [K74.60] 05/24/2015  . Suicidal ideation [R45.851]   . Recurrent cellulitis of lower leg [L03.119] 11/09/2014  . Venous (peripheral) insufficiency [I87.2] 11/09/2014  . Bipolar affective disorder (HCC) [F31.9]   . Alcohol dependence with alcohol-induced mood disorder (HCC) [F10.24]   . Opioid dependence with opioid-induced mood disorder (HCC) [F11.24]   . Severe recurrent major depression without psychotic features (HCC) [F33.2] 09/09/2014  . MDD (major depressive disorder) [F32.9] 09/09/2014  . Suicidal ideations [R45.851]   . Chronic pain syndrome [G89.4] 05/31/2014  . Tobacco dependence [F17.200] 05/30/2014  . Poor dentition [K08.9] 02/03/2014  . Hepatitis C virus infection without hepatic coma [B19.20] 02/03/2014  . Sepsis (HCC) [A41.9] 01/29/2014  . Anxiety state, unspecified [F41.1] 01/31/2013  . Mood disorder in conditions classified elsewhere [F06.30] 01/31/2013  . 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 Alcohol withdrawal (HCC) [F10.239] 01/27/2013    Class: Acute    Total Time spent with patient: 20 minutes  Past Psychiatric History: See H&P for full details  Past Medical History:  Past Medical History:  Diagnosis Date  . Bipolar disorder (HCC)   . Cellulitis   . Depression   . Hepatitis C     Past Surgical History:  Procedure Laterality Date  . BACK SURGERY    . ROTATOR CUFF REPAIR    . SHOULDER SURGERY     Family History:  Family History  Problem Relation Age of Onset  . Alcohol abuse Father   . Cancer Maternal Aunt    Family Psychiatric  History: See H&P for full details  Social History:  History  Alcohol Use  . 7.2 oz/week  . 12 Cans of beer per week    Comment: heavy     History  Drug Use No    Comment: denies at this time    Social History   Social History  . Marital status: Divorced    Spouse name: N/A  . Number of children: N/A  . Years of education: N/A   Social History Main Topics  . Smoking status: Current Every Day Smoker    Packs/day: 1.00    Types: Cigarettes    Start date: 10/22/1981  . Smokeless tobacco: Never Used  . Alcohol use 7.2 oz/week    12 Cans of beer per week     Comment: heavy  . Drug use: No     Comment: denies at this time  . Sexual activity: Not Currently   Other Topics Concern  . None   Social History Narrative  . None   Additional Social History:   Sleep: Fair  Appetite:  Fair  Current Medications: Current Facility-Administered Medications  Medication Dose Route Frequency Provider Last Rate Last Dose  . acetaminophen (TYLENOL) tablet 650 mg  650 mg Oral Q6H PRN Sanjuana KavaAgnes I Nwoko, NP   650 mg at 12/03/16 0746  . alum & mag hydroxide-simeth (MAALOX/MYLANTA) 200-200-20 MG/5ML suspension 30 mL  30 mL Oral Q4H PRN Sanjuana KavaAgnes I Nwoko, NP      . bismuth subsalicylate (PEPTO BISMOL) 262 MG/15ML suspension 30 mL  30 mL Oral Q4H PRN Oneta Rackanika N Lewis, NP      . Melene Muller[START ON 12/04/2016] buPROPion (WELLBUTRIN XL) 24 hr tablet 150 mg  150 mg Oral Daily Burnard LeighAlexander Arya Eksir, MD      . dicyclomine  (BENTYL) tablet 20 mg  20 mg Oral Q6H PRN Sanjuana KavaAgnes I Nwoko, NP   20 mg at 12/03/16 0448  . gabapentin (NEURONTIN) capsule 800 mg  800 mg Oral QID Charm RingsJamison Y Lord, NP   800 mg at 12/03/16 0743  . loperamide (IMODIUM) capsule 2-4 mg  2-4 mg Oral PRN Oneta Rackanika N Lewis, NP      . magnesium hydroxide (MILK OF MAGNESIA) suspension 30 mL  30 mL Oral Daily PRN Sanjuana KavaAgnes I Nwoko, NP   30 mL at 12/03/16 0851  . methocarbamol (ROBAXIN) tablet 500 mg  500 mg Oral Q8H PRN Sanjuana KavaAgnes I Nwoko, NP   500 mg at 12/03/16 0746  . multivitamin with minerals tablet 1 tablet  1 tablet Oral Daily Sanjuana KavaAgnes I Nwoko, NP   1 tablet at 12/03/16 0744  . naproxen (NAPROSYN) tablet 500 mg  500 mg Oral BID PRN Sanjuana KavaAgnes I Nwoko, NP   500 mg at 12/03/16 0449  . nicotine polacrilex (NICORETTE) gum 2 mg  2 mg Oral PRN Craige CottaFernando A Cobos, MD   2 mg at 12/03/16 0746  . ondansetron (ZOFRAN-ODT) disintegrating tablet 4 mg  4 mg Oral Q6H PRN Oneta Rackanika N Lewis, NP   4 mg at 12/03/16 0449  . pantoprazole (PROTONIX) EC tablet 40 mg  40 mg Oral Q0600 Charm RingsJamison Y Lord, NP   40 mg at 12/03/16 0449  . prazosin (MINIPRESS) capsule 2 mg  2 mg Oral QHS Burnard LeighAlexander Arya Eksir, MD      . QUEtiapine (SEROQUEL) tablet 100 mg  100 mg Oral QHS Burnard LeighAlexander Arya Eksir, MD      . QUEtiapine (SEROQUEL) tablet 50 mg  50 mg Oral BID Burnard LeighAlexander Arya Eksir, MD      . Melene Muller[START ON 12/04/2016] sertraline (ZOLOFT) tablet 150 mg  150 mg Oral Daily Burnard LeighAlexander Arya Eksir, MD      . thiamine (VITAMIN B-1) tablet 100 mg  100 mg Oral Daily Sanjuana KavaAgnes I Nwoko, NP   100 mg at 12/03/16 0744  . traZODone (DESYREL) tablet 100 mg  100 mg Oral QHS Burnard LeighAlexander Arya Eksir, MD        Lab Results: No results found for this or any previous visit (from the past 48 hour(s)).  Blood Alcohol level:  Lab Results  Component Value Date   ETH 60 (H) 11/27/2016   ETH <5 07/25/2015    Metabolic Disorder Labs: Lab Results  Component Value Date   HGBA1C 6.0 (H) 10/21/2014   MPG 126 (H) 10/21/2014   No results found for:  PROLACTIN No results found for: CHOL, TRIG, HDL, CHOLHDL, VLDL, LDLCALC  Physical Findings: AIMS: Facial and Oral Movements Muscles of Facial Expression: None, normal Lips and Perioral Area: None, normal Jaw: None, normal Tongue: None, normal,Extremity Movements Upper (arms, wrists,  hands, fingers): None, normal Lower (legs, knees, ankles, toes): None, normal, Trunk Movements Neck, shoulders, hips: None, normal, Overall Severity Severity of abnormal movements (highest score from questions above): None, normal Incapacitation due to abnormal movements: None, normal Patient's awareness of abnormal movements (rate only patient's report): No Awareness, Dental Status Current problems with teeth and/or dentures?: No Does patient usually wear dentures?: No  CIWA:  CIWA-Ar Total: 10 COWS:  COWS Total Score: 6  Musculoskeletal: Strength & Muscle Tone: within normal limits Gait & Station: normal Patient leans: N/A  Psychiatric Specialty Exam: Physical Exam  ROS  Blood pressure 107/62, pulse 74, temperature 97.4 F (36.3 C), temperature source Oral, resp. rate 16, height 5' 8.75" (1.746 m), weight 89.8 kg (198 lb).Body mass index is 29.45 kg/m.  General Appearance: Casual and Fairly Groomed  Eye Contact:  Good  Speech:  Clear and Coherent  Volume:  Normal  Mood:  Depressed and Dysphoric  Affect:  Appropriate and Congruent  Thought Process:  Coherent and Goal Directed  Orientation:  Full (Time, Place, and Person)  Thought Content:  Logical  Suicidal Thoughts:  No  Homicidal Thoughts:  No  Memory:  Recent;   Good  Judgement:  Impaired  Insight:  Present and Shallow  Psychomotor Activity:  Normal  Concentration:  Concentration: Fair  Recall:  NA  Fund of Knowledge:  Fair  Language:  Fair  Akathisia:  Negative  Handed:  Right  AIMS (if indicated):     Assets:  Desire for Improvement Housing Intimacy Vocational/Educational  ADL's:  Intact  Cognition:  WNL  Sleep:  Number of  Hours: 6   Treatment Plan Summary: Daily contact with patient to assess and evaluate symptoms and progress in treatment and Medication management  Continue to engage in individual and group therapies as available  # Alcohol use disorder, Opiate Use disorder - Continue Mv, thiamine, and prn medications for constitutional symptoms  # MDD, PTSD - Continue Zoloft increased to 150 mg QAM, Seroquel for mood/affective lability, prazosin (titrate to 2 mg), trazodone (titrate to 100 mg) - Restart Wellbutrin 150 mg QAM - Spent time reviewing risks/benefits and side effects and patient reports affective stability in the past with a similar regimen - Prazosin dose goal is 4-6 mg, can be titrated 1-2 mg per week as tolerated  # Disposition - Will continue to coordinate with SW and patient in dispo planning and Substance treatment options as outpatient  Burnard Leigh, MD 12/03/2016, 11:20 AM

## 2016-12-03 NOTE — Progress Notes (Signed)
Recreation Therapy Notes  Date: 12/03/16 Time: 0930 Location: [address] Group Room  Group Topic: Stress Management  Goal Area(s) Addresses:  Patient will verbalize importance of using healthy stress management.  Patient will identify positive emotions associated with healthy stress management.   Intervention: Stress Management  Activity :  Meditation.  LRT played a meditation on how to deal with stress from the Calm app.  Patients were to follow along as the meditation played.  Education:  Stress Management, Discharge Planning.   Education Outcome: Acknowledges edcuation/In group clarification offered/Needs additional education  Clinical Observations/Feedback: Pt did not attend group.   Caroll RancherMarjette Suni Jarnagin, LRT/CTRS         Lillia AbedLindsay, Venita Seng A 12/03/2016 1:05 PM

## 2016-12-03 NOTE — Plan of Care (Signed)
Problem: Activity: Goal: Interest or engagement in activities will improve Outcome: Progressing Pt has attended AA group this weekend   

## 2016-12-03 NOTE — Progress Notes (Signed)
Patient ID: Noelle PennerGary M Lanese, male   DOB: [birth year], 41 y.o.   MRN: [id number] PER STATE REGULATIONS 482.30  THIS CHART WAS REVIEWED FOR MEDICAL NECESSITY WITH RESPECT TO THE PATIENT'S ADMISSION/ DURATION OF STAY.  NEXT REVIEW DATE: 12/06/2016  Willa RoughJENNIFER JONES Zuhair Lariccia, RN, BSN CASE MANAGER
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
## 2016-12-03 NOTE — Progress Notes (Signed)
Patient ID: [name] [name], male   DOB: [birth year], 41 y.o.   MRN: [id number]  Pt currently presents with a blunted affect and restless behavior. Pt reports to Clinical research associatewriter that [facility] Pt states "I am having a lot of anxiety." Pt reports good sleep with current medication regimen. Pt paces on unit today, less than on previous days. Affect brighter.  Pt provided with medications per providers orders. Pt's labs and vitals were monitored throughout the night. Pt given a 1:1 about emotional and mental status. Pt supported and encouraged to express concerns and questions. Pt educated on medications.  Pt's safety ensured with 15 minute and environmental checks. Pt currently denies SI/HI and A/V hallucinations. Pt verbally agrees to seek staff if SI/HI or A/VH occurs and to consult with staff before acting on any harmful thoughts. Will continue POC.

## 2016-12-03 NOTE — Progress Notes (Signed)
Patient ID: [name], male   DOB: [birth year], 41 y.o.   MRN: [id number]  DAR: Pt. Denies HI and A/V Hallucinations. He reports passive SI but is able to contract for safety. Patient does report pain in his lower back and generalized pain related to withdrawal. He reports his anxiety continues. He rates anxiety 10/10, hopelessness 9/10, and depression 9/10. He reports sleep is poor, appetite is fair, energy level is hyper, and concentration is poor. Support and encouragement provided to the patient. Scheduled and PRN medications administered to patient per physician's orders. Patient is seen in the milieu and can be intrusive at times. He is irritable at times but remains under control at this point. Q15 minute checks are maintained for safety.

## 2016-12-03 NOTE — Progress Notes (Signed)
D.  Pt extremely anxious on approach, reiterated how badly he is withdrawing and how anxious he is.  Pt requested prn for anxiety but did not have one available.Pt did attend evening AA group, interacting appropriately with peers on the unit.  Pt denies SI/HI/hallucinations at this time but states if he is discharged feeling like this he will use again.  A.  Spoke with NP about Pt's increasing anxiety and new orders received.  Medication given as ordered, support and encouragement offered.  R. Pt remains safe on the unit, will continue to monitor.

## 2016-12-04 DIAGNOSIS — F1020 Alcohol dependence, uncomplicated: Secondary | ICD-10-CM

## 2016-12-04 LAB — HEMOGLOBIN A1C
Hgb A1c MFr Bld: 5.8 % — ABNORMAL HIGH (ref 4.8–5.6)
Mean Plasma Glucose: 120 mg/dL

## 2016-12-04 MED ORDER — BUPROPION HCL ER (XL) 150 MG PO TB24: ORAL_TABLET | ORAL | 0 refills | Status: DC

## 2016-12-04 MED ORDER — SERTRALINE HCL 100 MG PO TABS
200.0000 mg | ORAL_TABLET | Freq: Every day | ORAL | Status: DC
  Filled 2016-12-04 (×3): qty 42

## 2016-12-04 MED ORDER — NICOTINE POLACRILEX 2 MG MT GUM: 2.0000 mg | CHEWING_GUM | OROMUCOSAL | 0 refills | Status: DC | PRN

## 2016-12-04 MED ORDER — TRAZODONE HCL 100 MG PO TABS
100.0000 mg | ORAL_TABLET | Freq: Every day | ORAL | 0 refills | Status: DC
Start: 2016-12-04 — End: 2017-06-22

## 2016-12-04 MED ORDER — QUETIAPINE FUMARATE 100 MG PO TABS: 100.0000 mg | ORAL_TABLET | Freq: Every day | ORAL | 0 refills | Status: DC

## 2016-12-04 MED ORDER — SERTRALINE HCL 100 MG PO TABS: 200.0000 mg | ORAL_TABLET | Freq: Every day | ORAL | 0 refills | Status: DC

## 2016-12-04 MED ORDER — METHOCARBAMOL 500 MG PO TABS: 500.0000 mg | ORAL_TABLET | Freq: Three times a day (TID) | ORAL | 0 refills | Status: AC | PRN

## 2016-12-04 MED ORDER — QUETIAPINE FUMARATE 50 MG PO TABS: 50.0000 mg | ORAL_TABLET | Freq: Two times a day (BID) | ORAL | 0 refills | Status: DC

## 2016-12-04 MED ORDER — THIAMINE HCL 100 MG PO TABS: 100.0000 mg | ORAL_TABLET | Freq: Every day | ORAL | 0 refills | Status: DC

## 2016-12-04 MED ORDER — GABAPENTIN 800 MG PO TABS
800.0000 mg | ORAL_TABLET | Freq: Four times a day (QID) | ORAL | Status: DC
  Filled 2016-12-04 (×4): qty 84

## 2016-12-04 MED ORDER — PRAZOSIN HCL 1 MG PO CAPS
3.0000 mg | ORAL_CAPSULE | Freq: Every day | ORAL | Status: DC
  Filled 2016-12-04 (×2): qty 77

## 2016-12-04 MED ORDER — PANTOPRAZOLE SODIUM 40 MG PO TBEC: 40.0000 mg | DELAYED_RELEASE_TABLET | Freq: Every day | ORAL | 0 refills | Status: DC

## 2016-12-04 MED ORDER — PRAZOSIN HCL 1 MG PO CAPS: 3.0000 mg | ORAL_CAPSULE | Freq: Every day | ORAL | 0 refills | Status: DC

## 2016-12-04 MED ORDER — GABAPENTIN 400 MG PO CAPS: 800.0000 mg | ORAL_CAPSULE | Freq: Four times a day (QID) | ORAL | 0 refills | Status: DC

## 2016-12-04 MED ORDER — HYDROXYZINE HCL 50 MG PO TABS: 50.0000 mg | ORAL_TABLET | Freq: Three times a day (TID) | ORAL | 0 refills | Status: DC | PRN

## 2016-12-04 MED ORDER — ADULT MULTIVITAMIN W/MINERALS CH
1.0000 | ORAL_TABLET | Freq: Every day | ORAL | 0 refills | Status: DC
Start: 2016-12-05 — End: 2017-06-22

## 2016-12-04 MED ORDER — PRAZOSIN HCL 1 MG PO CAPS: ORAL_CAPSULE | ORAL | 0 refills | Status: DC

## 2016-12-04 NOTE — Progress Notes (Signed)
Patient visibly anxious, states he is discharging to [facility] at 1100 today. Reports restless sleep. Reports chronic back pain of an 8/10 but refuses tylenol stating, "it doesn't work for me."   Emotional support provided. Asked to complete Self Inventory and Suicide Prevention Plan. Medicated per orders. No prns requested or required due to symptoms.  Denies SI/HI and remains safe on level III obs. Awaiting discharge order.

## 2016-12-04 NOTE — [facility] Group Notes (Signed)
[facility] Group Notes:  (Nursing/MHT/Case Management/Adjunct)  Date:  12/04/2016  Time:  0900  Type of Therapy:  Nurse Education  Participation Level:  Minimal  Participation Quality:  Inattentive  Affect:  Anxious  Cognitive:  Alert  Insight:  Lacking  Engagement in Group:  Lacking  Modes of Intervention:  Education  Summary of Progress/Problems: Patient attended however participation minimal as patient is focused on discharge.  Merian CapronFriedman, Deardra Hinkley [facility] 12/04/2016, 0930
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
## 2016-12-04 NOTE — Discharge Summary (Signed)
Physician Discharge Summary Note  Patient:  [name] [name] is an 41 y.o., male MRN:  [id number] DOB:  [birth year] Patient phone:  [phone] (home)  Patient address:   [place] [address],   Total Time spent with patient: Greater than 30 minutes.  Date of Admission:  11/28/2016  Date of Discharge: 12-04-16  Reason for Admission: Increased drug use, suicidal ideations with plan to shoot himself.   Principal Problem: Alcohol withdrawal [facility])  Discharge Diagnoses: Patient Active Problem List   Diagnosis Date Noted  . Polysubstance (including opioids) dependence with physiological dependence [facility]) [F19.20] 01/27/2013    Priority: High    Class: Acute  . Alcohol withdrawal (HCC) [F10.239] 01/27/2013    Priority: High    Class: Acute  . Bipolar affective disorder (HCC) [F31.9]     Priority: Medium  . Alcohol use disorder, severe, dependence (HCC) [F10.20]   . Major depressive disorder, recurrent severe without psychotic features (HCC) [F33.2] 12/03/2016  . ETOH abuse [F10.10]   . Cirrhosis (HCC) [K74.60] 05/24/2015  . Suicidal ideation [R45.851]   . Recurrent cellulitis of lower leg [L03.119] 11/09/2014  . Venous (peripheral) insufficiency [I87.2] 11/09/2014  . Alcohol dependence with alcohol-induced mood disorder (HCC) [F10.24]   . Opioid dependence with opioid-induced mood disorder (HCC) [F11.24]   . Severe recurrent major depression without psychotic features (HCC) [F33.2] 09/09/2014  . MDD (major depressive disorder) [F32.9] 09/09/2014  . Suicidal ideations [R45.851]   . Chronic pain syndrome [G89.4] 05/31/2014  . Tobacco dependence [F17.200] 05/30/2014  . Poor dentition [K08.9] 02/03/2014  . Hepatitis C virus infection without hepatic coma [B19.20] 02/03/2014  . Sepsis (HCC) [A41.9] 01/29/2014  . Anxiety state, unspecified [F41.1] 01/31/2013  . Mood disorder in conditions classified elsewhere [F06.30] 01/31/2013   Past Psychiatric History: Opioid use disorder,    Past Medical History:  Past Medical History:  Diagnosis Date  . Bipolar disorder (HCC)   . Cellulitis   . Depression   . Hepatitis C     Past Surgical History:  Procedure Laterality Date  . BACK SURGERY    . ROTATOR CUFF REPAIR    . SHOULDER SURGERY     Family History:  Family History  Problem Relation Age of Onset  . Alcohol abuse Father   . Cancer Maternal Aunt    Family Psychiatric  History: See H&P  Social History:  History  Alcohol Use  . 7.2 oz/week  . 12 Cans of beer per week    Comment: heavy     History  Drug Use No    Comment: denies at this time    Social History   Social History  . Marital status: Divorced    Spouse name: N/A  . Number of children: N/A  . Years of education: N/A   Social History Main Topics  . Smoking status: Current Every Day Smoker    Packs/day: 1.00    Types: Cigarettes    Start date: 10/22/1981  . Smokeless tobacco: Never Used  . Alcohol use 7.2 oz/week    12 Cans of beer per week     Comment: heavy  . Drug use: No     Comment: denies at this time  . Sexual activity: Not Currently   Other Topics Concern  . None   Social History Narrative  . None   Hospital Course: This is an admission assessment for this 41 year old Caucasian male with hx of mental illness & Polysubstance dependence since teenage years. He is being admitted  to the [facility] adult unit with complaints of suicidal ideations with plans to shoot himself with a gun triggered by his relapse on drugs/excessive drug use. He currently actively withdrawing.   During this assessment, Everitt reports, "My sister dropped me off at the [facility] yesterday. I have been using a lot of drugs/alcohol (Benzodiazepines, Alcohol & Subutex). I buy them off the streets. I have been using heavily x 9 months. I use drugs to cope with life & my personal problems. I have marital issues with my wife including financial problems. I'm working some, but, I draw disability  benefits due to back problems/surgeries. I started using drugs & alcohol since age 9. I was sober x 1 year, relapsed 9 months ago. I have had substance abuse treatment at the [facility] about 2 years ago. I started AA/NA meetings 15 years, then will quit, relapse, start meetings ago, will repeat the cycle. I was diagnosed with Depression, anxiety & PTSD 4 years ago. The PTSD symptoms came from me watching my maternal cousin shoot himself. I was on medications (Wellbutrin, Gabapentin, Sertraline, Seroquel & Trazodone). I have benefited the most from Wellbutrin & Gabapentin. I stopped taking these medicines about a week ago because I was drinking heavily & getting high a lot. I have had one suicide attempt during my teen years by overdose. I was hospitalized, my stomach was pumped. I have been feeling suicidal x 2 weeks now with plans to shoot myself with a pistol. I'm having bad withdrawal symptoms now; muscle cramps, abdominal pains, my skin crawling, bad ass anxiety & headaches. I need help with detox & substance abuse treatment after discharge. My mother has schizophrenia"  Adorian was admitted to the [facility] adult unit with complains of suicidal ideations with plans to shoot himself. He cited a recent relapse on drugs & alcohol as the trigger. He does have hx of suicide attempt by overdose in his teen years. He has hx history of mental illness & had received mental health treatment with several medications. He is admitted for drug/alcohol detoxification & mood stabilization treatments. His discharge plans included a referral & appointment to a long term substance abuse treatment for further treatment after discharge.  After evaluation of his presenting symptoms, it was noted that Michaeljames's UDS on admission was positive for Benzodiazepines, Cocaine, THC & he admitted using Subutex. His BAL was 60 & he was presenting with substance withdrawal symptoms. He received Librium/Clonidine  detoxification treatment protocols for alcohol & Benzodiazepine. Cocaine intoxication however, has no established detoxification treatment protocols as of yet.  Besides the detoxification treatment protocols, Dionisios was also medicated & discharged on; Wellbutrin XL 150 mg for depression, Gabapentin 800 mg for agitation/substance withdrawal syndrome, Hydroxyzine 50 mg prn for anxiety, Nicorette gum for smoking cessation, Minipress 3 mg for nightmares, Seroquel 100 mg for mood control, Sertraline 200 mg for depression & Trazodone 100 mg for insomnia.  Besides the detoxification & mood stabilization treatments, Matti was also enrolled & participated in the group counseling sessions & AA/NA meetings being offered & held on this unit to learn coping skills . He participated & learned coping skills that should help him cope better to maintain sobriety/mood stability after discharge. Prospero has completed detoxification treatment & his mood is now stable. This is evidenced by his reports of improved mood, absence of suicidal ideations & substance withdrawal symptoms. He is currently being discharged to to the [facility] treatment center in [place], [place] for further substance abuse  treatments. And for medication management & routine psychiatric care, Jillyn HiddenGary will be receiving these services at [facility]. He is provided with all the necessary information needed to make this appointments without problems.  Upon discharge, Jillyn HiddenGary adamantly denies any SIHI, AVH, delusional thoughts or paranoia. He was provided with a 21 days worth supply samples of [facility]. [facility] in no apparent distress. [facility] per [facility] staff. Physical Findings: AIMS: Facial and Oral Movements Muscles of Facial Expression: None, normal Lips and Perioral Area: None, normal Jaw: None, normal Tongue: None, normal,Extremity Movements Upper (arms, wrists, hands, fingers): None,  normal Lower (legs, knees, ankles, toes): None, normal, Trunk Movements Neck, shoulders, hips: None, normal, Overall Severity Severity of abnormal movements (highest score from questions above): None, normal Incapacitation due to abnormal movements: None, normal Patient's awareness of abnormal movements (rate only patient's report): No Awareness, Dental Status Current problems with teeth and/or dentures?: No Does patient usually wear dentures?: No  CIWA:  CIWA-Ar Total: 2 COWS:  COWS Total Score: 5  Musculoskeletal: Strength & Muscle Tone: within normal limits Gait & Station: normal Patient leans: N/A  Psychiatric Specialty Exam: Physical Exam  Constitutional: He is oriented to person, place, and time. He appears well-developed.  HENT:  Head: Normocephalic.  Eyes: Pupils are equal, round, and reactive to light.  Neck: Normal range of motion.  Cardiovascular: Normal rate.   Respiratory: Effort normal.  GI: Soft.  Genitourinary:  Genitourinary Comments: Deferred  Musculoskeletal: Normal range of motion.  Neurological: He is alert and oriented to person, place, and time.  Skin: Skin is warm and dry.    Review of Systems  Constitutional: Negative.   HENT: Negative.   Eyes: Negative.   Respiratory: Negative.   Cardiovascular: Negative.   Gastrointestinal: Negative.   Genitourinary: Negative.   Musculoskeletal: Negative.   Skin: Negative.   Neurological: Negative.   Endo/Heme/Allergies: Negative.   Psychiatric/Behavioral: Positive for depression (Stable) and substance abuse (Hx. Polysubstance use disorder). Negative for hallucinations and suicidal ideas.    Blood pressure 135/65, pulse 68, temperature 99 F (37.2 C), temperature source Oral, resp. rate 16, height 5' 8.75" (1.746 m), weight 89.8 kg (198 lb).Body mass index is 29.45 kg/m.  See Md's SRA   Have you used any form of tobacco in the last 30 days? (Cigarettes, Smokeless Tobacco, Cigars, and/or Pipes): Yes  Has  this patient used any form of tobacco in the last 30 days? (Cigarettes, Smokeless Tobacco, Cigars, and/or Pipes): Yes, provided with nicorette gum prescription.  Blood Alcohol level:  Lab Results  Component Value Date   ETH 60 (H) 11/27/2016   ETH <5 07/25/2015   Metabolic Disorder Labs:  Lab Results  Component Value Date   HGBA1C 5.8 (H) 12/03/2016   MPG 120 12/03/2016   MPG 126 (H) 10/21/2014   No results found for: PROLACTIN No results found for: CHOL, TRIG, HDL, CHOLHDL, VLDL, LDLCALC  See Psychiatric Specialty Exam and Suicide Risk Assessment completed by Attending Physician prior to discharge.  Discharge destination:  Home  Is patient on multiple antipsychotic therapies at discharge:  No   Has Patient had three or more failed trials of antipsychotic monotherapy by history:  No  Recommended Plan for Multiple Antipsychotic Therapies: NA  Allergies as of 12/04/2016      Reactions   Lithium Anaphylaxis   Penicillins Rash   Has patient had a PCN reaction causing immediate rash, facial/tongue/throat swelling, SOB or lightheadedness with hypotension: Yes  Has patient had a PCN reaction causing severe rash involving mucus membranes or skin necrosis: No Has patient had a PCN reaction that required hospitalization No Has patient had a PCN reaction occurring within the last 10 years: No If all of the above answers are "NO", then may proceed with Cephalosporin use.      Medication List    STOP taking these medications   Ledipasvir-Sofosbuvir 90-400 MG Tabs Commonly known as:  HARVONI     TAKE these medications     Indication  buPROPion 150 MG 24 hr tablet Commonly known as:  WELLBUTRIN XL Take 150 mg XL daily for 1 week, then increase to 300 mg XL daily thereafter  Indication:  Major Depressive Disorder   gabapentin 400 MG capsule Commonly known as:  NEURONTIN Take 2 capsules (800 mg total) by mouth 4 (four) times daily. For anxiety, alcohol use, pain What changed:   additional instructions  Indication:  Pain   hydrOXYzine 50 MG tablet Commonly known as:  ATARAX/VISTARIL Take 1 tablet (50 mg total) by mouth 3 (three) times daily as needed for anxiety.  Indication:  Anxiety Neurosis   methocarbamol 500 MG tablet Commonly known as:  ROBAXIN Take 1 tablet (500 mg total) by mouth every 8 (eight) hours as needed for muscle spasms.  Indication:  Musculoskeletal Pain   multivitamin with minerals Tabs tablet Take 1 tablet by mouth daily. Start taking on:  12/05/2016  Indication:  alcohol use, poor nutritional status   nicotine polacrilex 2 MG gum Commonly known as:  NICORETTE Take 1 each (2 mg total) by mouth as needed for smoking cessation.  Indication:  Nicotine Addiction   pantoprazole 40 MG tablet Commonly known as:  PROTONIX Take 1 tablet (40 mg total) by mouth daily. What changed:  when to take this  Indication:  Gastroesophageal Reflux Disease   prazosin 1 MG capsule Commonly known as:  MINIPRESS Take 3 capsules (3 mg total) by mouth at bedtime. For nightmares  Indication:  Nightmares   QUEtiapine 100 MG tablet Commonly known as:  SEROQUEL Take 1 tablet (100 mg total) by mouth at bedtime.  Indication:  mood lability, augmenting SSRI   QUEtiapine 50 MG tablet Commonly known as:  SEROQUEL Take 1 tablet (50 mg total) by mouth 2 (two) times daily.  Indication:  Mood control   sertraline 100 MG tablet Commonly known as:  ZOLOFT Take 2 tablets (200 mg total) by mouth daily. For depression What changed:  how much to take  additional instructions  Indication:  Major Depressive Disorder   thiamine 100 MG tablet Take 1 tablet (100 mg total) by mouth daily. Start taking on:  12/05/2016  Indication:  alcohol use, poor nutrition   traZODone 100 MG tablet Commonly known as:  DESYREL Take 1 tablet (100 mg total) by mouth at bedtime.  Indication:  Trouble Sleeping      Follow-up Information    [facility] Follow up.   Specialty:   Behavioral Health Why:  Walk in within 7 days after discharge from treatment for hospital follow-up/medication management and assessment for counseling services. Walk in hours Monday through Friday between 8am-9am.  Contact information: [address] [place] [address] [phone]        [facility] Follow up on 12/04/2016.   Why:  [facility] will be here to pick you up at 11am for long term treatment. Contact information: [address] [place], [place] [address] [phone]         Follow-up  recommendations: Activity:  As tolerated Diet: As recommended by your primary care doctor. Keep all scheduled follow-up appointments as recommended.   Comments: Patient is instructed prior to discharge to: Take all medications as prescribed by his/her mental healthcare provider. Report any adverse effects and or reactions from the medicines to his/her outpatient provider promptly. Patient has been instructed & cautioned: To not engage in alcohol and or illegal drug use while on prescription medicines. In the event of worsening symptoms, patient is instructed to call the crisis hotline, 911 and or go to the nearest ED for appropriate evaluation and treatment of symptoms. To follow-up with his/her primary care provider for your other medical issues, concerns and or health care needs.   Signed: Sanjuana Kava, NP, PMHNP, FNP-BC 12/04/2016, 10:39 AM

## 2016-12-04 NOTE — Progress Notes (Signed)
Patient verbalizes readiness for discharge. Follow up plan explained, AVS, transition record and SRA given along with prescriptions. Sample meds provided. All belongings returned. Patient verbalizes understanding. 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 [facility] and [facility].

## 2016-12-04 NOTE — Tx Team (Signed)
Interdisciplinary Treatment and Diagnostic Plan Update  12/04/2016 Time of Session: 9:30AM [name] [name] MRN: [id number]  Principal Diagnosis: Polysubstance dependence (including Opioid drugs) with physiological dependence  Secondary Diagnoses: Principal Problem:   Alcohol withdrawal ([place]) Active Problems:   Polysubstance (including opioids) dependence with physiological dependence ([place])   Suicidal ideations   Severe recurrent major depression without psychotic features ([place])   Alcohol dependence with alcohol-induced mood disorder ([place])   Opioid dependence with opioid-induced mood disorder ([place])   Major depressive disorder, recurrent severe without psychotic features ([place])   Alcohol use disorder, severe, dependence ([place])   Current Medications:  Current Facility-Administered Medications  Medication Dose Route Frequency Provider Last Rate Last Dose  . acetaminophen (TYLENOL) tablet 650 mg  650 mg Oral Q6H PRN Encarnacion Slates, NP   650 mg at 12/03/16 1547  . alum & mag hydroxide-simeth (MAALOX/MYLANTA) 200-200-20 MG/5ML suspension 30 mL  30 mL Oral Q4H PRN Encarnacion Slates, NP      . bismuth subsalicylate (PEPTO BISMOL) 262 MG/15ML suspension 30 mL  30 mL Oral Q4H PRN Derrill Center, NP      . buPROPion (WELLBUTRIN XL) 24 hr tablet 150 mg  150 mg Oral Daily Aundra Dubin, MD   150 mg at 12/04/16 0753  . dicyclomine (BENTYL) tablet 20 mg  20 mg Oral Q6H PRN Encarnacion Slates, NP   20 mg at 12/03/16 0448  . gabapentin (NEURONTIN) capsule 800 mg  800 mg Oral QID Patrecia Pour, NP   800 mg at 12/04/16 0753  . hydrOXYzine (ATARAX/VISTARIL) tablet 50 mg  50 mg Oral TID PRN Aundra Dubin, MD   50 mg at 12/04/16 0612  . loperamide (IMODIUM) capsule 2-4 mg  2-4 mg Oral PRN Derrill Center, NP      . magnesium hydroxide (MILK OF MAGNESIA) suspension 30 mL  30 mL Oral Daily PRN Encarnacion Slates, NP   30 mL at 12/03/16 0851  . methocarbamol (ROBAXIN) tablet 500 mg  500 mg Oral Q8H PRN Encarnacion Slates,  NP   500 mg at 12/04/16 7616  . multivitamin with minerals tablet 1 tablet  1 tablet Oral Daily Encarnacion Slates, NP   1 tablet at 12/04/16 0753  . naproxen (NAPROSYN) tablet 500 mg  500 mg Oral BID PRN Encarnacion Slates, NP   500 mg at 12/04/16 0426  . nicotine polacrilex (NICORETTE) gum 2 mg  2 mg Oral PRN Jenne Campus, MD   2 mg at 12/04/16 0755  . ondansetron (ZOFRAN-ODT) disintegrating tablet 4 mg  4 mg Oral Q6H PRN Derrill Center, NP   4 mg at 12/03/16 0449  . pantoprazole (PROTONIX) EC tablet 40 mg  40 mg Oral Q0600 Patrecia Pour, NP   40 mg at 12/04/16 0737  . prazosin (MINIPRESS) capsule 2 mg  2 mg Oral QHS Aundra Dubin, MD   2 mg at 12/03/16 2103  . QUEtiapine (SEROQUEL) tablet 100 mg  100 mg Oral QHS Aundra Dubin, MD   100 mg at 12/03/16 2212  . QUEtiapine (SEROQUEL) tablet 50 mg  50 mg Oral BID Aundra Dubin, MD   50 mg at 12/04/16 0753  . sertraline (ZOLOFT) tablet 150 mg  150 mg Oral Daily Aundra Dubin, MD   150 mg at 12/04/16 0753  . thiamine (VITAMIN B-1) tablet 100 mg  100 mg Oral Daily Encarnacion Slates, NP   100 mg at 12/04/16 0756  .  traZODone (DESYREL) tablet 100 mg  100 mg Oral QHS Aundra Dubin, MD   100 mg at 12/03/16 2212   PTA Medications: Prescriptions Prior to Admission  Medication Sig Dispense Refill Last Dose  . gabapentin (NEURONTIN) 400 MG capsule Take 2 capsules (800 mg total) by mouth 4 (four) times daily. 240 capsule 0 11/27/2016 at Unknown time  . Ledipasvir-Sofosbuvir (HARVONI) 90-400 MG TABS Take 1 tablet by mouth daily. (Patient not taking: Reported on 07/25/2015) 28 tablet 2 Completed Course at Unknown time  . pantoprazole (PROTONIX) 40 MG tablet Take 1 tablet (40 mg total) by mouth daily at 6 (six) AM. (Patient not taking: Reported on 05/24/2015) 30 tablet 0 Completed Course at Unknown time  . sertraline (ZOLOFT) 100 MG tablet Take 1 tablet (100 mg total) by mouth daily. (Patient not taking: Reported on 11/27/2016) 30 tablet 0 Not  Taking at Unknown time    Patient Stressors: Financial difficulties Marital or family conflict Medication change or noncompliance Occupational concerns Substance abuse  Patient Strengths: Ability for insight Average or above average intelligence Communication skills General fund of knowledge Motivation for treatment/growth Physical Health Supportive family/friends Work skills  Treatment Modalities: Medication Management, Group therapy, Case management,  1 to 1 session with clinician, Psychoeducation, Recreational therapy.   Physician Treatment Plan for Primary Diagnosis: Polysubstance dependence (including Opioid drugs) with physiological dependence Long Term Goal(s): Improvement in symptoms so as ready for discharge Improvement in symptoms so as ready for discharge   Short Term Goals: Ability to identify changes in lifestyle to reduce recurrence of condition will improve Ability to disclose and discuss suicidal ideas Ability to identify and develop effective coping behaviors will improve Ability to identify changes in lifestyle to reduce recurrence of condition will improve Ability to verbalize feelings will improve Ability to identify and develop effective coping behaviors will improve Compliance with prescribed medications will improve  Medication Management: Evaluate patient's response, side effects, and tolerance of medication regimen.  Therapeutic Interventions: 1 to 1 sessions, Unit Group sessions and Medication administration.  Evaluation of Outcomes: Adequate for Discharge  Physician Treatment Plan for Secondary Diagnosis: Principal Problem:   Alcohol withdrawal ([place]) Active Problems:   Polysubstance (including opioids) dependence with physiological dependence ([place])   Suicidal ideations   Severe recurrent major depression without psychotic features ([place])   Alcohol dependence with alcohol-induced mood disorder ([facility])   Opioid dependence with opioid-induced mood  disorder ([place])   Major depressive disorder, recurrent severe without psychotic features ([place])   Alcohol use disorder, severe, dependence ([place])  Long Term Goal(s): Improvement in symptoms so as ready for discharge Improvement in symptoms so as ready for discharge   Short Term Goals: Ability to identify changes in lifestyle to reduce recurrence of condition will improve Ability to disclose and discuss suicidal ideas Ability to identify and develop effective coping behaviors will improve Ability to identify changes in lifestyle to reduce recurrence of condition will improve Ability to verbalize feelings will improve Ability to identify and develop effective coping behaviors will improve Compliance with prescribed medications will improve     Medication Management: Evaluate patient's response, side effects, and tolerance of medication regimen.  Therapeutic Interventions: 1 to 1 sessions, Unit Group sessions and Medication administration.  Evaluation of Outcomes: Adequate for Discharge   RN Treatment Plan for Primary Diagnosis: Polysubstance dependence (including Opioid drugs) with physiological dependence Long Term Goal(s): Knowledge of disease and therapeutic regimen to maintain health will improve  Short Term Goals: Ability to remain free from injury  will improve, Ability to verbalize feelings will improve and Ability to disclose and discuss suicidal ideas  Medication Management: RN will administer medications as ordered by provider, will assess and evaluate patient's response and provide education to patient for prescribed medication. RN will report any adverse and/or side effects to prescribing provider.  Therapeutic Interventions: 1 on 1 counseling sessions, Psychoeducation, Medication administration, Evaluate responses to treatment, Monitor vital signs and CBGs as ordered, Perform/monitor CIWA, COWS, AIMS and Fall Risk screenings as ordered, Perform wound care treatments as  ordered.  Evaluation of Outcomes: Adequate for Discharge   LCSW Treatment Plan for Primary Diagnosis: Polysubstance dependence (including Opioid drugs) with physiological dependence Long Term Goal(s): Safe transition to appropriate next level of care at discharge, Engage patient in therapeutic group addressing interpersonal concerns.  Short Term Goals: Engage patient in aftercare planning with referrals and resources, Facilitate patient progression through stages of change regarding substance use diagnoses and concerns and Identify triggers associated with mental health/substance abuse issues  Therapeutic Interventions: Assess for all discharge needs, 1 to 1 time with Social worker, Explore available resources and support systems, Assess for adequacy in community support network, Educate family and significant other(s) on suicide prevention, Complete Psychosocial Assessment, Interpersonal group therapy.  Evaluation of Outcomes: Adequate for Discharge   Progress in Treatment: Attending groups: Yes. Participating in groups: Yes. Taking medication as prescribed: Yes. Toleration medication: Yes. Family/Significant other contact made: SPE completed with pt; pt declined to consent to family contact.  Patient understands diagnosis: Yes. Discussing patient identified problems/goals with staff: Yes. Medical problems stabilized or resolved: Yes. Denies suicidal/homicidal ideation: No. Passive SI/able to contract for safety on the unit.  Issues/concerns per patient self-inventory: No. Other: n/a  New problem(s) identified: No, Describe:  n/a  New Short Term/Long Term Goal(s): detox; medication stabilization; development of comprehensive mental wellness/sobriety plan.   Discharge Plan or Barriers: CSW assessing for appropriate referrals. Pt's [facility] to [facility] was 08/2014 where he was referred to [facility] for substance abuse inpatient treatment. 11/30/16--pt requested [facility]  screening--scheduled for: Thursday   12/06/16 at 1145AM. [facility] for outpatient mental health services.   Reason for Continuation of Hospitalization: None, patient has met goals.  Estimated Length of Stay: Will discharge today to [facility]  Attendees: Patient: 12/04/2016 9:52 AM  Physician: Dr. Daron Offer MD 12/04/2016 9:52 AM  Nursing: Ciro Backer RN 12/04/2016 9:52 AM  RN Care Manager: Lars Pinks CM 12/04/2016 9:52 AM  Social Worker: Vidal Schwalbe, LCSW 12/04/2016 9:52 AM  Recreational Therapist: Laretta Bolster, RT 12/04/2016 9:52 AM  Other: Lindell Spar NP; May Augustin NP 12/04/2016 9:52 AM  Other:  12/04/2016 9:52 AM  Other: 12/04/2016 9:52 AM    Scribe for Treatment Team: Lilly Cove, LCSW 12/04/2016 9:52 AM

## 2016-12-04 NOTE — Progress Notes (Signed)
  [facility] Adult Case Management Discharge Plan :  Will you be returning to the same living situation after discharge:  No. At discharge, do you have transportation home?: Yes,  [facility] will come and pick patient up Do you have the ability to pay for your medications: Yes,  given a 21 day supply  Release of information consent forms completed and in the chart;  Patient's signature needed at discharge.  Patient to Follow up at: Follow-up Information    [facility] Follow up.   Specialty:  Behavioral Health Why:  Walk in within 7 days after discharge from treatment for hospital follow-up/medication management and assessment for counseling services. Walk in hours Monday through Friday between 8am-9am.  Contact information: [address] [place] [address] [phone]        [facility] Follow up on 12/04/2016.   Why:  [facility] will be here to pick you up at 11am for long term treatment. Contact information: [address], [place], [place] [address] [phone]          Next level of care provider has [facility] to [facility]:yes  Safety Planning and Suicide Prevention discussed: Yes,  completed with patient  Have you used any form of tobacco in the last 30 days? (Cigarettes, Smokeless Tobacco, Cigars, and/or Pipes): Yes  Has patient been referred to the Quitline?: Patient refused referral  Patient has been referred for addiction treatment: Yes  Raye SorrowCoble, Ahmira Boisselle N 12/04/2016, 9:55 AM

## 2017-06-08 ENCOUNTER — Emergency Department (HOSPITAL_COMMUNITY)
Admission: EM | Admit: 2017-06-08 | Discharge: 2017-06-08 | Disposition: A | Discharge status: Home / Self Care | Payer: Medicare Other | Attending: Emergency Medicine | Admitting: Emergency Medicine

## 2017-06-08 ENCOUNTER — Encounter (HOSPITAL_COMMUNITY): Payer: Self-pay | Admitting: Emergency Medicine

## 2017-06-08 DIAGNOSIS — M79605 Pain in left leg: Secondary | ICD-10-CM | POA: Diagnosis present

## 2017-06-08 DIAGNOSIS — L03116 Cellulitis of left lower limb: Secondary | ICD-10-CM | POA: Diagnosis not present

## 2017-06-08 DIAGNOSIS — Z79899 Other long term (current) drug therapy: Secondary | ICD-10-CM | POA: Diagnosis not present

## 2017-06-08 DIAGNOSIS — F17210 Nicotine dependence, cigarettes, uncomplicated: Secondary | ICD-10-CM | POA: Diagnosis not present

## 2017-06-08 LAB — CBC
HCT: 40.9 % (ref 39.0–52.0)
Hemoglobin: 13.9 g/dL (ref 13.0–17.0)
MCH: 31.2 pg (ref 26.0–34.0)
MCHC: 34 g/dL (ref 30.0–36.0)
MCV: 91.9 fL (ref 78.0–100.0)
PLATELETS: 297 10*3/uL (ref 150–400)
RBC: 4.45 MIL/uL (ref 4.22–5.81)
RDW: 14 % (ref 11.5–15.5)
WBC: 13.6 10*3/uL — AB (ref 4.0–10.5)

## 2017-06-08 LAB — BASIC METABOLIC PANEL
ANION GAP: 12 (ref 5–15)
BUN: 9 mg/dL (ref 6–20)
CO2: 24 mmol/L (ref 22–32)
Calcium: 8.9 mg/dL (ref 8.9–10.3)
Chloride: 102 mmol/L (ref 101–111)
Creatinine, Ser: 0.75 mg/dL (ref 0.61–1.24)
GLUCOSE: 118 mg/dL — AB (ref 65–99)
Potassium: 3.8 mmol/L (ref 3.5–5.1)
SODIUM: 138 mmol/L (ref 135–145)

## 2017-06-08 MED ORDER — ACETAMINOPHEN 500 MG PO TABS
1000.0000 mg | ORAL_TABLET | Freq: Once | ORAL | Status: AC
  Administered 2017-06-08: 1000 mg via ORAL
  Filled 2017-06-08: qty 2

## 2017-06-08 MED ORDER — IBUPROFEN 800 MG PO TABS
800.0000 mg | ORAL_TABLET | Freq: Once | ORAL | Status: AC
  Administered 2017-06-08: 800 mg via ORAL
  Filled 2017-06-08: qty 1

## 2017-06-08 MED ORDER — CEPHALEXIN 500 MG PO CAPS: 500.0000 mg | ORAL_CAPSULE | Freq: Four times a day (QID) | ORAL | 0 refills | Status: DC

## 2017-06-08 MED ORDER — OXYCODONE HCL 5 MG PO TABS
5.0000 mg | ORAL_TABLET | Freq: Once | ORAL | Status: AC
  Administered 2017-06-08: 5 mg via ORAL
  Filled 2017-06-08: qty 1

## 2017-06-08 NOTE — ED Provider Notes (Signed)
[facility]-EMERGENCY DEPT Provider Note   CSN: [id number] Arrival date & time: 06/08/17  0132     History   Chief Complaint Chief Complaint  Patient presents with  . Cellulitis    HPI [name] [name] is a 41 y.o. male.  41 yo M with a chief complaint of a painful left lower extremity. Patient has a history of recurrent cellulitis there. Has some superficial scrapes that he had just walking.Also has been bit by multiple insects. Denies fevers or chills denies nausea or vomiting.   The history is provided by the patient.  Illness  This is a new problem. The current episode started yesterday. The problem occurs constantly. The problem has not changed since onset.Pertinent negatives include no chest pain, no abdominal pain, no headaches and no shortness of breath. Nothing aggravates the symptoms. Nothing relieves the symptoms. He has tried nothing for the symptoms. The treatment provided no relief.    Past Medical History:  Diagnosis Date  . Bipolar disorder (HCC)   . Cellulitis   . Depression   . Hepatitis C     Patient Active Problem List   Diagnosis Date Noted  . Alcohol use disorder, severe, dependence (HCC)   . 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 ETOH abuse   . Cirrhosis (HCC) 05/24/2015  . Suicidal ideation   . Recurrent cellulitis of lower leg 11/09/2014  . Venous (peripheral) insufficiency 11/09/2014  . Bipolar affective disorder (HCC)   . Alcohol dependence with alcohol-induced mood disorder (HCC)   . Opioid dependence with opioid-induced mood disorder (HCC)   . Severe recurrent major depression without psychotic features (HCC) 09/09/2014  . MDD (major depressive disorder) 09/09/2014  . Suicidal ideations   . Chronic pain syndrome 05/31/2014  . Tobacco dependence 05/30/2014  . Poor dentition 02/03/2014  . Hepatitis C virus infection without hepatic coma 02/03/2014  . Sepsis (HCC) 01/29/2014  . Anxiety state,  unspecified 01/31/2013  . Mood disorder in conditions classified elsewhere 01/31/2013  . Polysubstance (including opioids) dependence with physiological dependence (HCC) 01/27/2013    Class: Acute  . Alcohol withdrawal (HCC) 01/27/2013    Class: Acute    Past Surgical History:  Procedure Laterality Date  . BACK SURGERY    . ROTATOR CUFF REPAIR    . SHOULDER SURGERY         Home Medications    Prior to Admission medications   Medication Sig Start Date End Date Taking? Authorizing Provider  buPROPion (WELLBUTRIN XL) 150 MG 24 hr tablet Take 150 mg XL daily for 1 week, then increase to 300 mg XL daily thereafter 12/04/16   Burnard Leigh, MD  cephALEXin (KEFLEX) 500 MG capsule Take 1 capsule (500 mg total) by mouth 4 (four) times daily. 06/08/17   Melene Plan, DO  gabapentin (NEURONTIN) 400 MG capsule Take 2 capsules (800 mg total) by mouth 4 (four) times daily. For anxiety, alcohol use, pain 12/04/16 01/03/17  Eksir, Bo Mcclintock, MD  hydrOXYzine (ATARAX/VISTARIL) 50 MG tablet Take 1 tablet (50 mg total) by mouth 3 (three) times daily as needed for anxiety. 12/04/16   Eksir, Bo Mcclintock, MD  Multiple Vitamin (MULTIVITAMIN WITH MINERALS) TABS tablet Take 1 tablet by mouth daily. 12/05/16   Eksir, Bo Mcclintock, MD  nicotine polacrilex (NICORETTE) 2 MG gum Take 1 each (2 mg total) by mouth as needed for smoking cessation. 12/04/16   Burnard Leigh, MD  pantoprazole (PROTONIX) 40 MG tablet Take 1 tablet (40  mg total) by mouth daily. 12/04/16 01/03/17  Burnard Leigh, MD  prazosin (MINIPRESS) 1 MG capsule Take 3 capsules (3 mg total) by mouth at bedtime. For nightmares 12/04/16   Armandina Stammer I, NP  QUEtiapine (SEROQUEL) 100 MG tablet Take 1 tablet (100 mg total) by mouth at bedtime. 12/04/16 01/03/17  Burnard Leigh, MD  QUEtiapine (SEROQUEL) 50 MG tablet Take 1 tablet (50 mg total) by mouth 2 (two) times daily. 12/04/16 01/03/17  Burnard Leigh, MD  sertraline  (ZOLOFT) 100 MG tablet Take 2 tablets (200 mg total) by mouth daily. For depression 12/04/16   Armandina Stammer I, NP  thiamine 100 MG tablet Take 1 tablet (100 mg total) by mouth daily. 12/05/16   Burnard Leigh, MD  traZODone (DESYREL) 100 MG tablet Take 1 tablet (100 mg total) by mouth at bedtime. 12/04/16   Eksir, Bo Mcclintock, MD    Family History Family History  Problem Relation Age of Onset  . Alcohol abuse Father   . Cancer Maternal Aunt     Social History Social History  Substance Use Topics  . Smoking status: Current Every Day Smoker    Packs/day: 1.00    Types: Cigarettes    Start date: 10/22/1981  . Smokeless tobacco: Never Used  . Alcohol use 7.2 oz/week    12 Cans of beer per week     Comment: heavy     Allergies   Lithium and Penicillins   Review of Systems Review of Systems  Constitutional: Negative for chills and fever.  HENT: Negative for congestion and facial swelling.   Eyes: Negative for discharge and visual disturbance.  Respiratory: Negative for shortness of breath.   Cardiovascular: Negative for chest pain and palpitations.  Gastrointestinal: Negative for abdominal pain, diarrhea and vomiting.  Musculoskeletal: Positive for arthralgias and myalgias.  Skin: Negative for color change and rash.  Neurological: Negative for tremors, syncope and headaches.  Psychiatric/Behavioral: Negative for confusion and dysphoric mood.     Physical Exam Updated Vital Signs BP (!) 135/92 (BP Location: Right Arm)   Pulse 91   Temp 98.7 F (37.1 C) (Oral)   Resp 20   SpO2 97%   Physical Exam  Constitutional: He is oriented to person, place, and time. He appears well-developed and well-nourished.  HENT:  Head: Normocephalic and atraumatic.  Eyes: Pupils are equal, round, and reactive to light. EOM are normal.  Neck: Normal range of motion. Neck supple. No JVD present.  Cardiovascular: Normal rate and regular rhythm.  Exam reveals no gallop and no friction  rub.   No murmur heard. Pulmonary/Chest: No respiratory distress. He has no wheezes.  Abdominal: He exhibits no distension. There is no rebound and no guarding.  Musculoskeletal: Normal range of motion.  Neurological: He is alert and oriented to person, place, and time.  Skin: No rash noted. No pallor.     Psychiatric: He has a normal mood and affect. His behavior is normal.  Nursing note and vitals reviewed.    ED Treatments / Results  Labs (all labs ordered are listed, but only abnormal results are displayed) Labs Reviewed  CBC - Abnormal; Notable for the following:       Result Value   WBC 13.6 (*)    All other components within normal limits  BASIC METABOLIC PANEL - Abnormal; Notable for the following:    Glucose, Bld 118 (*)    All other components within normal limits    EKG  EKG Interpretation  None       Radiology No results found.  Procedures Procedures (including critical care time)  Medications Ordered in ED Medications  acetaminophen (TYLENOL) tablet 1,000 mg (not administered)  ibuprofen (ADVIL,MOTRIN) tablet 800 mg (not administered)  oxyCODONE (Oxy IR/ROXICODONE) immediate release tablet 5 mg (not administered)     Initial Impression / Assessment and Plan / ED Course  I have reviewed the triage vital signs and the nursing notes.  Pertinent labs & imaging results that were available during my care of the patient were reviewed by me and considered in my medical decision making (see chart for details).     41 yo M with a history of recurrent cellulitis comes in with mild erythema and warmth to the left lower extremity. I'm unsure if this is actually cellulitis episodes very early in the clinical course. Will start on Keflex. PCP follow-up.  6:23 AM:  I have discussed the diagnosis/risks/treatment options with the patient and believe the pt to be eligible for discharge home to follow-up with PCP. We also discussed returning to the ED immediately if  new or worsening sx occur. We discussed the sx which are most concerning (e.g., rapid spreading redness, fever) that necessitate immediate return. Medications administered to the patient during their visit and any new prescriptions provided to the patient are listed below.  Medications given during this visit Medications  acetaminophen (TYLENOL) tablet 1,000 mg (not administered)  ibuprofen (ADVIL,MOTRIN) tablet 800 mg (not administered)  oxyCODONE (Oxy IR/ROXICODONE) immediate release tablet 5 mg (not administered)     The patient appears reasonably screen and/or stabilized for discharge and I doubt any other medical condition or other [facility] requiring further screening, evaluation, or treatment in the ED at this time prior to discharge.    Final Clinical Impressions(s) / ED Diagnoses   Final diagnoses:  Cellulitis of left lower extremity    New Prescriptions New Prescriptions   CEPHALEXIN (KEFLEX) 500 MG CAPSULE    Take 1 capsule (500 mg total) by mouth 4 (four) times daily.     Melene Plan, DO 06/08/17 [phone]

## 2017-06-08 NOTE — ED Notes (Signed)
Pt presents to ED for assessment of redness, pain and warmth to his left leg.  Hx of being admitted to cellulitis recently.  Pt states he is homeless, has no way to take care of himself outside of hospital, and concerned for the safety of his leg due to recent hospitalization.  Pt reassured.

## 2017-06-15 ENCOUNTER — Emergency Department (HOSPITAL_COMMUNITY)
Admission: EM | Admit: 2017-06-15 | Discharge: 2017-06-16 | Disposition: A | Discharge status: Other Acute Inpatient Hospital | Payer: Medicare Other | Attending: Emergency Medicine | Admitting: Emergency Medicine

## 2017-06-15 ENCOUNTER — Encounter (HOSPITAL_COMMUNITY): Payer: Self-pay | Admitting: Emergency Medicine

## 2017-06-15 DIAGNOSIS — F1920 Other psychoactive substance dependence, uncomplicated: Secondary | ICD-10-CM | POA: Diagnosis present

## 2017-06-15 DIAGNOSIS — L03116 Cellulitis of left lower limb: Secondary | ICD-10-CM | POA: Diagnosis not present

## 2017-06-15 DIAGNOSIS — F17210 Nicotine dependence, cigarettes, uncomplicated: Secondary | ICD-10-CM | POA: Diagnosis not present

## 2017-06-15 DIAGNOSIS — F332 Major depressive disorder, recurrent severe without psychotic features: Secondary | ICD-10-CM | POA: Diagnosis present

## 2017-06-15 DIAGNOSIS — Z79899 Other long term (current) drug therapy: Secondary | ICD-10-CM | POA: Insufficient documentation

## 2017-06-15 DIAGNOSIS — R45851 Suicidal ideations: Secondary | ICD-10-CM

## 2017-06-15 DIAGNOSIS — B1710 Acute hepatitis C without hepatic coma: Secondary | ICD-10-CM | POA: Insufficient documentation

## 2017-06-15 DIAGNOSIS — F1910 Other psychoactive substance abuse, uncomplicated: Secondary | ICD-10-CM | POA: Insufficient documentation

## 2017-06-15 LAB — COMPREHENSIVE METABOLIC PANEL
ALBUMIN: 4.4 g/dL (ref 3.5–5.0)
ALT: 37 U/L (ref 17–63)
AST: 41 U/L (ref 15–41)
Alkaline Phosphatase: 81 U/L (ref 38–126)
Anion gap: 10 (ref 5–15)
BILIRUBIN TOTAL: 0.6 mg/dL (ref 0.3–1.2)
BUN: 9 mg/dL (ref 6–20)
CALCIUM: 9.2 mg/dL (ref 8.9–10.3)
CO2: 25 mmol/L (ref 22–32)
Chloride: 101 mmol/L (ref 101–111)
Creatinine, Ser: 0.77 mg/dL (ref 0.61–1.24)
GFR calc Af Amer: >60 mL/min (ref >60)
GLUCOSE: 96 mg/dL (ref 65–99)
Potassium: 3.9 mmol/L (ref 3.5–5.1)
Sodium: 136 mmol/L (ref 135–145)
Total Protein: 8.2 g/dL — ABNORMAL HIGH (ref 6.5–8.1)

## 2017-06-15 LAB — CBC
HEMATOCRIT: 44.3 % (ref 39.0–52.0)
HEMOGLOBIN: 15.7 g/dL (ref 13.0–17.0)
MCH: 32.4 pg (ref 26.0–34.0)
MCHC: 35.4 g/dL (ref 30.0–36.0)
MCV: 91.5 fL (ref 78.0–100.0)
Platelets: 343 10*3/uL (ref 150–400)
RBC: 4.84 MIL/uL (ref 4.22–5.81)
RDW: 13.7 % (ref 11.5–15.5)
WBC: 16.3 10*3/uL — AB (ref 4.0–10.5)

## 2017-06-15 LAB — RAPID URINE DRUG SCREEN, HOSP PERFORMED
Amphetamines: POSITIVE — AB
BARBITURATES: NOT DETECTED
BENZODIAZEPINES: NOT DETECTED
Cocaine: NOT DETECTED
Opiates: POSITIVE — AB
TETRAHYDROCANNABINOL: POSITIVE — AB

## 2017-06-15 LAB — SALICYLATE LEVEL: Salicylate Lvl: <7 mg/dL (ref 2.8–30.0)

## 2017-06-15 LAB — ACETAMINOPHEN LEVEL: Acetaminophen (Tylenol), Serum: <10 ug/mL — ABNORMAL LOW (ref 10–30)

## 2017-06-15 LAB — ETHANOL: Alcohol, Ethyl (B): <5 mg/dL (ref <5)

## 2017-06-15 MED ORDER — FOLIC ACID 1 MG PO TABS
1.0000 mg | ORAL_TABLET | Freq: Every day | ORAL | Status: DC
  Administered 2017-06-15 – 2017-06-16 (×2): 1 mg via ORAL
  Filled 2017-06-15 (×2): qty 1

## 2017-06-15 MED ORDER — PANTOPRAZOLE SODIUM 40 MG PO TBEC
40.0000 mg | DELAYED_RELEASE_TABLET | Freq: Every day | ORAL | Status: DC
  Administered 2017-06-15 – 2017-06-16 (×2): 40 mg via ORAL
  Filled 2017-06-15 (×2): qty 1

## 2017-06-15 MED ORDER — ONDANSETRON 4 MG PO TBDP: 4.0000 mg | ORAL_TABLET | Freq: Four times a day (QID) | ORAL | Status: DC | PRN

## 2017-06-15 MED ORDER — ALUM & MAG HYDROXIDE-SIMETH 200-200-20 MG/5ML PO SUSP: 30.0000 mL | Freq: Four times a day (QID) | ORAL | Status: DC | PRN

## 2017-06-15 MED ORDER — LORAZEPAM 1 MG PO TABS
1.0000 mg | ORAL_TABLET | Freq: Four times a day (QID) | ORAL | Status: DC | PRN
  Administered 2017-06-15: 1 mg via ORAL
  Filled 2017-06-15: qty 1

## 2017-06-15 MED ORDER — ONDANSETRON HCL 4 MG PO TABS: 4.0000 mg | ORAL_TABLET | Freq: Three times a day (TID) | ORAL | Status: DC | PRN

## 2017-06-15 MED ORDER — LORAZEPAM 2 MG/ML IJ SOLN: 1.0000 mg | Freq: Four times a day (QID) | INTRAMUSCULAR | Status: DC | PRN

## 2017-06-15 MED ORDER — HYDROXYZINE HCL 25 MG PO TABS
25.0000 mg | ORAL_TABLET | Freq: Four times a day (QID) | ORAL | Status: DC | PRN
  Administered 2017-06-15: 25 mg via ORAL
  Filled 2017-06-15: qty 1

## 2017-06-15 MED ORDER — CEPHALEXIN 500 MG PO CAPS: 500.0000 mg | ORAL_CAPSULE | Freq: Four times a day (QID) | ORAL | Status: DC

## 2017-06-15 MED ORDER — LORAZEPAM 1 MG PO TABS: 1.0000 mg | ORAL_TABLET | Freq: Four times a day (QID) | ORAL | Status: DC | PRN

## 2017-06-15 MED ORDER — BUPROPION HCL ER (XL) 150 MG PO TB24
150.0000 mg | ORAL_TABLET | Freq: Every day | ORAL | Status: DC
  Filled 2017-06-15: qty 1

## 2017-06-15 MED ORDER — GABAPENTIN 300 MG PO CAPS
300.0000 mg | ORAL_CAPSULE | Freq: Four times a day (QID) | ORAL | Status: DC
  Administered 2017-06-15 – 2017-06-16 (×3): 300 mg via ORAL
  Filled 2017-06-15 (×3): qty 1

## 2017-06-15 MED ORDER — GABAPENTIN 400 MG PO CAPS
800.0000 mg | ORAL_CAPSULE | Freq: Four times a day (QID) | ORAL | Status: DC
  Filled 2017-06-15: qty 2

## 2017-06-15 MED ORDER — ADULT MULTIVITAMIN W/MINERALS CH
1.0000 | ORAL_TABLET | Freq: Every day | ORAL | Status: DC
  Administered 2017-06-15 – 2017-06-16 (×2): 1 via ORAL
  Filled 2017-06-15 (×2): qty 1

## 2017-06-15 MED ORDER — CLONIDINE HCL 0.1 MG PO TABS
0.1000 mg | ORAL_TABLET | Freq: Four times a day (QID) | ORAL | Status: DC
  Administered 2017-06-15 – 2017-06-16 (×3): 0.1 mg via ORAL
  Filled 2017-06-15 (×3): qty 1

## 2017-06-15 MED ORDER — CEPHALEXIN 500 MG PO CAPS
500.0000 mg | ORAL_CAPSULE | Freq: Four times a day (QID) | ORAL | Status: DC
  Administered 2017-06-15 – 2017-06-16 (×3): 500 mg via ORAL
  Filled 2017-06-15 (×3): qty 1

## 2017-06-15 MED ORDER — ACETAMINOPHEN 325 MG PO TABS: 650.0000 mg | ORAL_TABLET | ORAL | Status: DC | PRN

## 2017-06-15 MED ORDER — HYDROXYZINE HCL 25 MG PO TABS: 50.0000 mg | ORAL_TABLET | Freq: Three times a day (TID) | ORAL | Status: DC | PRN

## 2017-06-15 MED ORDER — ONDANSETRON HCL 4 MG PO TABS
4.0000 mg | ORAL_TABLET | Freq: Three times a day (TID) | ORAL | Status: DC | PRN
  Administered 2017-06-15: 4 mg via ORAL
  Filled 2017-06-15: qty 1

## 2017-06-15 MED ORDER — NICOTINE POLACRILEX 2 MG MT GUM
2.0000 mg | CHEWING_GUM | OROMUCOSAL | Status: DC | PRN
  Administered 2017-06-15 – 2017-06-16 (×3): 2 mg via ORAL
  Filled 2017-06-15 (×3): qty 1

## 2017-06-15 MED ORDER — VITAMIN B-1 100 MG PO TABS
100.0000 mg | ORAL_TABLET | Freq: Every day | ORAL | Status: DC
  Filled 2017-06-15: qty 1

## 2017-06-15 MED ORDER — SERTRALINE HCL 50 MG PO TABS
200.0000 mg | ORAL_TABLET | Freq: Every day | ORAL | Status: DC
  Filled 2017-06-15: qty 4

## 2017-06-15 MED ORDER — CLONIDINE HCL 0.1 MG PO TABS: 0.1000 mg | ORAL_TABLET | Freq: Two times a day (BID) | ORAL | Status: DC

## 2017-06-15 MED ORDER — THIAMINE HCL 100 MG/ML IJ SOLN: 100.0000 mg | Freq: Every day | INTRAMUSCULAR | Status: DC

## 2017-06-15 MED ORDER — QUETIAPINE FUMARATE 100 MG PO TABS: 100.0000 mg | ORAL_TABLET | Freq: Every day | ORAL | Status: DC

## 2017-06-15 MED ORDER — LOPERAMIDE HCL 2 MG PO CAPS
2.0000 mg | ORAL_CAPSULE | ORAL | Status: DC | PRN
Start: 2017-06-15 — End: 2017-06-15

## 2017-06-15 MED ORDER — CLONIDINE HCL 0.1 MG PO TABS: 0.1000 mg | ORAL_TABLET | Freq: Every day | ORAL | Status: DC

## 2017-06-15 MED ORDER — METHOCARBAMOL 500 MG PO TABS: 500.0000 mg | ORAL_TABLET | Freq: Three times a day (TID) | ORAL | Status: DC | PRN

## 2017-06-15 MED ORDER — PRAZOSIN HCL 1 MG PO CAPS: 3.0000 mg | ORAL_CAPSULE | Freq: Every day | ORAL | Status: DC

## 2017-06-15 MED ORDER — TRAZODONE HCL 100 MG PO TABS: 100.0000 mg | ORAL_TABLET | Freq: Every day | ORAL | Status: DC

## 2017-06-15 MED ORDER — ADULT MULTIVITAMIN W/MINERALS CH: 1.0000 | ORAL_TABLET | Freq: Every day | ORAL | Status: DC

## 2017-06-15 MED ORDER — LORAZEPAM 1 MG PO TABS: 0.0000 mg | ORAL_TABLET | Freq: Two times a day (BID) | ORAL | Status: DC

## 2017-06-15 MED ORDER — DICYCLOMINE HCL 20 MG PO TABS: 20.0000 mg | ORAL_TABLET | Freq: Four times a day (QID) | ORAL | Status: DC | PRN

## 2017-06-15 MED ORDER — NAPROXEN 500 MG PO TABS: 500.0000 mg | ORAL_TABLET | Freq: Two times a day (BID) | ORAL | Status: DC | PRN

## 2017-06-15 MED ORDER — LORAZEPAM 1 MG PO TABS
0.0000 mg | ORAL_TABLET | Freq: Four times a day (QID) | ORAL | Status: DC
  Filled 2017-06-15: qty 1

## 2017-06-15 NOTE — ED Notes (Signed)
Up to the bathroom 

## 2017-06-15 NOTE — ED Notes (Signed)
Pt up to the bathroom, vomiting

## 2017-06-15 NOTE — ED Notes (Signed)
Hourly rounding reveals patient in room sleeping. No complaints, stable, in no acute distress. Q15 minute rounds and monitoring via Security Cameras to continue.  

## 2017-06-15 NOTE — ED Notes (Signed)
Hourly rounding reveals patient sleeping in room. No complaints, stable, in no acute distress. Q15 minute rounds and monitoring via Security Cameras to continue. 

## 2017-06-15 NOTE — BH Assessment (Addendum)
Assessment Note    [name] [name] is a 41 year old male that presents this date with thoughts of self harm and a plan to jump off a bridge. Patient is oriented to time/place and denies any H/I or AVH. Patient states he has been receiving services in the form of medication management  from [facility] but has not seen that provider in over four months due to excessive SA use. Patient states he was diagnosed with MDD and GAD "years ago" and has been self medicating with multiple illicit substances "off and on" for the last two years. Patient reports daily use of cocaine, opiates and alcohol for the last four months. Patient reports snorting 1/2 gram of Heroin daily and a unknown amount of cocaine with last reported use on 06/15/17 when patient reported using multiple substances. Patient is vague in reference to time frame and amounts and is a poor historian. Patient reports he consumes alcohol every day in various amounts with last reported use on 06/16/17 when patient drank over 1/5 of liquor. Patient renders limited information due to being partially impaired during assessment. Patient reports withdrawals to include: nausea and tremors. Patient states he is currently residing in a boarding home which is a high risk environment conducive to relapse. Patient reported receiving SA treatment multiple times with most recent 09/2014 at [facility]. Patient stated he was going to AA and NA meeting for a month or two before relapsing. 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 Patient identified some friends in his meetings as supports. Complains of feeling suicidal for the past 2 weeks. Plan is to jump off a bridge. He does admit to prior suicide attempt. He also requests detox from alcohol and heroin. Last used heroin yesterday by snorting it he also injected heroin earlier this week into his left arm. Last drank alcohol this morning. No other associated symptoms. Patient is requesting to get back on his medications to  assist with depression with symptoms to include: feeling worthless and "loss of interest in everything." Case was staffed with Shaune Pollack DNP who recommended patient be admitted inpatient as appropriate bed placement is investigated.  Diagnosis: MDD recurrent without psychotic features, severe Polysubstance abuse  Past Medical History:  Past Medical History:  Diagnosis Date  . Bipolar disorder (HCC)   . Cellulitis   . Depression   . Hepatitis C     Past Surgical History:  Procedure Laterality Date  . BACK SURGERY    . ROTATOR CUFF REPAIR    . SHOULDER SURGERY      Family History:  Family History  Problem Relation Age of Onset  . Alcohol abuse Father   . Cancer Maternal Aunt     Social History:  reports that he has been smoking Cigarettes.  He started smoking about 35 years ago. He has been smoking about 1.00 pack per day. He has never used smokeless tobacco. He reports that he drinks about 7.2 oz of alcohol per week . He reports that he does not use drugs.  Additional Social History:  Alcohol / Drug Use Pain Medications: See PTA medication list Prescriptions: See MAR Over the Counter: See MAR History of alcohol / drug use?: Yes Longest period of sobriety (when/how long): 2 years sobriety about 8 years ago Negative Consequences of Use: Personal relationships, Financial Withdrawal Symptoms: Nausea / Vomiting, Agitation Substance #1 Name of Substance 1: Cocaine 1 - Age of First Use: 32 1 - Amount (size/oz): 1 to 2 grams 1 - Frequency:  Two to three days a week 1 - Duration: Pt is vague in reference to use/duration  1 - Last Use / Amount: 06/14/17 1 gram Substance #2 Name of Substance 2: Cannabis 2 - Age of First Use: 41 years of age 63 - Amount (size/oz): 3 blunts per day 2 - Frequency: Daily use 2 - Duration: Pt states "many years" 2 - Last Use / Amount: 06/14/17 1 gram Substance #3 Name of Substance 3: Opiates  3 - Age of First Use: Teens 3 - Amount (size/oz): Varied  amounts 3 - Frequency: Three to four times a week 3 - Duration: Pt is vague states years 3 - Last Use / Amount: 06/14/17 1/2 gram   CIWA: CIWA-Ar BP: (!) 151/91 Pulse Rate: 93 Nausea and Vomiting: 3 Tactile Disturbances: mild itching, pins and needles, burning or numbness Tremor: no tremor Auditory Disturbances: not present Paroxysmal Sweats: no sweat visible Visual Disturbances: not present Anxiety: two Headache, Fullness in Head: none present Agitation: normal activity Orientation and Clouding of Sensorium: oriented and can do serial additions CIWA-Ar Total: 7 COWS: Clinical Opiate Withdrawal Scale (COWS) Resting Pulse Rate: Pulse Rate 81-100 Sweating: No report of chills or flushing Restlessness: Able to sit still Pupil Size: Pupils pinned or normal size for room light Bone or Joint Aches: Mild diffuse discomfort Runny Nose or Tearing: Not present GI Upset: Vomiting or diarrhea Tremor: No tremor Yawning: No yawning Anxiety or Irritability: Patient reports increasing irritability or anxiousness Gooseflesh Skin: Skin is smooth COWS Total Score: 6  Allergies:  Allergies  Allergen Reactions  . Lithium Anaphylaxis  . Penicillins Rash    Has patient had a PCN reaction causing immediate rash, facial/tongue/throat swelling, SOB or lightheadedness with hypotension: Yes Has patient had a PCN reaction causing severe rash involving mucus membranes or skin necrosis: No Has patient had a PCN reaction that required hospitalization No Has patient had a PCN reaction occurring within the last 10 years: No If all of the above answers are "NO", then may proceed with Cephalosporin use.     Home Medications:  (Not in a hospital admission)  OB/GYN Status:  No LMP for male patient.  General Assessment Data Location of Assessment: [facility] TTS Assessment: In system Is this a Tele or Face-to-Face Assessment?: Face-to-Face Is this an Initial Assessment or a Re-assessment for this  encounter?: Initial Assessment Marital status: Single Maiden name: NA Is patient pregnant?: No Pregnancy Status: No Living Arrangements: Alone Can pt return to current living arrangement?: Yes Admission Status: Voluntary Is patient capable of signing voluntary admission?: Yes Referral Source: Self/Family/Friend Insurance type: Medicaid  Medical Screening Exam [facility] Walk-in ONLY) Medical Exam completed: Yes  Crisis Care Plan Living Arrangements: Alone Legal Guardian:  (NA) Name of Psychiatrist: [facility] Name of Therapist: None  Education Status Is patient currently in school?: No Current Grade:  (NA) Highest grade of school patient has completed: 12 Name of school:  (NA) Contact person:  (NA)  Risk to self with the past 6 months Suicidal Ideation: Yes-Currently Present Has patient been a risk to self within the past 6 months prior to admission? : No Suicidal Intent: Yes-Currently Present Has patient had any suicidal intent within the past 6 months prior to admission? : No Is patient at risk for suicide?: Yes Suicidal Plan?: Yes-Currently Present Has patient had any suicidal plan within the past 6 months prior to admission? : No Specify Current Suicidal Plan: Pt was going to jump off bridge Access to Means: Yes  Specify Access to Suicidal Means: Pt was on bridge What has been your use of drugs/alcohol within the last 12 months?: Current use Previous Attempts/Gestures: Yes How many times?: 2 Other Self Harm Risks: NA Triggers for Past Attempts: Unknown Intentional Self Injurious Behavior: None Family Suicide History: No Recent stressful life event(s): Other (Comment) (Excessive SA use) Persecutory voices/beliefs?: No Depression: Yes Depression Symptoms: Feeling worthless/self pity Substance abuse history and/or treatment for substance abuse?: Yes Suicide prevention information given to non-admitted patients: Not applicable  Risk to Others within the past 6  months Homicidal Ideation: No Does patient have any lifetime risk of violence toward others beyond the six months prior to admission? : No Thoughts of Harm to Others: No Current Homicidal Intent: No Current Homicidal Plan: No Access to Homicidal Means: No Identified Victim: NA History of harm to others?: No Assessment of Violence: None Noted Violent Behavior Description: NA Does patient have access to weapons?: No Criminal Charges Pending?: No Does patient have a court date: No Is patient on probation?: No  Psychosis Hallucinations: None noted Delusions: None noted  Mental Status Report Appearance/Hygiene: In scrubs Eye Contact: Fair Motor Activity: Freedom of movement Speech: Soft, Slow Level of Consciousness: Drowsy Mood: Pleasant Affect: Appropriate to circumstance Anxiety Level: Minimal Thought Processes: Coherent, Relevant Judgement: Partial Orientation: Person, Place, Time Obsessive Compulsive Thoughts/Behaviors: None  Cognitive Functioning Concentration: Good Memory: Recent Intact, Remote Intact IQ: Average Insight: Fair Impulse Control: Poor Appetite: Poor Weight Loss: 0 Weight Gain: 0 Sleep: Decreased Total Hours of Sleep: 3 Vegetative Symptoms: None  ADLScreening [facility] Assessment Services) Patient's cognitive ability adequate to safely complete daily activities?: Yes Patient able to express need for assistance with ADLs?: Yes Independently performs ADLs?: Yes (appropriate for developmental age)  Prior Inpatient Therapy Prior Inpatient Therapy: Yes Prior Therapy Dates: 2018 Prior [facility]ilty[facility]Provider[facility] Reason for Treatment: MH issues  Prior Outpatient Therapy Prior Outpatient Therapy: Yes Prior Therapy Dates: 2018 Prior Therapy Facilty/Provider(s): [facility] Reason for Treatment: Med mang Does patient have an ACCT team?: No Does patient have Intensive In-House Services?  : No Does patient have [facility] services? : No Does patient  have P4CC services?: No  ADL Screening (condition at time of admission) Patient's cognitive ability adequate to safely complete daily activities?: Yes Is the patient deaf or have difficulty hearing?: No Does the patient have difficulty seeing, even when wearing glasses/contacts?: No Does the patient have difficulty concentrating, remembering, or making decisions?: No Patient able to express need for assistance with ADLs?: Yes Does the patient have difficulty dressing or bathing?: No Independently performs ADLs?: Yes (appropriate for developmental age) Does the patient have difficulty walking or climbing stairs?: No Weakness of Legs: None Weakness of Arms/Hands: None  Home Assistive Devices/Equipment Home Assistive Devices/Equipment: None  Therapy Consults (therapy consults require a physician order) PT Evaluation Needed: No OT Evalulation Needed: No SLP Evaluation Needed: No Abuse/Neglect Assessment (Assessment to be complete while patient is alone) Physical Abuse: Denies Verbal Abuse: Denies Sexual Abuse: Denies Exploitation of patient/patient's resources: Denies Self-Neglect: Denies Values / Beliefs Cultural Requests During Hospitalization: None Spiritual Requests During Hospitalization: None Consults Spiritual Care Consult Needed: No Social Work Consult Needed: No Merchant navy officer (For Healthcare) Does Patient Have a Medical Advance Directive?: No Would patient like information on creating a medical advance directive?: No - Patient declined    Additional Information 1:1 In Past 12 Months?: No CIRT Risk: No Elopement Risk: No Does patient have medical clearance?: Yes     Disposition:  Case was staffed with Shaune Pollack DNP who recommended patient be admitted inpatient as appropriate bed placement is investigated.  Disposition Initial Assessment Completed for this Encounter: Yes Disposition of Patient: Inpatient treatment program Type of inpatient treatment program:  Adult  On Site Evaluation by:   Reviewed with Physician:    Alfredia Ferguson 06/15/2017 5:48 PM

## 2017-06-15 NOTE — ED Provider Notes (Addendum)
[facility]-EMERGENCY DEPT Provider Note   CSN: [id number] Arrival date & time: 06/15/17  1217     History   Chief Complaint Chief Complaint  Patient presents with  . Suicidal  . detox  level V caveat psychiatric disorder.  HPI [name] [name] is a 41 y.o. male.  HPI Complains of feeling suicidal for the past 2 weeks. Plan is to jump off a bridge. He does admit to prior suicide attempt. He also requests detox from alcohol and heroin. Last used heroin yesterday by snorting it he also injected heroin earlier this week into his left arm. Last drank alcohol this morning. No other associated symptoms Past Medical History:  Diagnosis Date  . Bipolar disorder (HCC)   . Cellulitis   . Depression   . Hepatitis C     Patient Active Problem List   Diagnosis Date Noted  . Alcohol use disorder, severe, dependence (HCC)   . 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 ETOH abuse   . Cirrhosis (HCC) 05/24/2015  . Suicidal ideation   . Recurrent cellulitis of lower leg 11/09/2014  . Venous (peripheral) insufficiency 11/09/2014  . Bipolar affective disorder (HCC)   . Alcohol dependence with alcohol-induced mood disorder (HCC)   . Opioid dependence with opioid-induced mood disorder (HCC)   . Severe recurrent major depression without psychotic features (HCC) 09/09/2014  . MDD (major depressive disorder) 09/09/2014  . Suicidal ideations   . Chronic pain syndrome 05/31/2014  . Tobacco dependence 05/30/2014  . Poor dentition 02/03/2014  . Hepatitis C virus infection without hepatic coma 02/03/2014  . Sepsis (HCC) 01/29/2014  . Anxiety state, unspecified 01/31/2013  . Mood disorder in conditions classified elsewhere 01/31/2013  . Polysubstance (including opioids) dependence with physiological dependence (HCC) 01/27/2013    Class: Acute  . Alcohol withdrawal (HCC) 01/27/2013    Class: Acute    Past Surgical History:  Procedure Laterality Date  .  BACK SURGERY    . ROTATOR CUFF REPAIR    . SHOULDER SURGERY         Home Medications    Prior to Admission medications   Medication Sig Start Date End Date Taking? Authorizing Provider  buPROPion (WELLBUTRIN XL) 150 MG 24 hr tablet Take 150 mg XL daily for 1 week, then increase to 300 mg XL daily thereafter 12/04/16   Burnard Leigh, MD  cephALEXin (KEFLEX) 500 MG capsule Take 1 capsule (500 mg total) by mouth 4 (four) times daily. 06/08/17   Melene Plan, DO  gabapentin (NEURONTIN) 400 MG capsule Take 2 capsules (800 mg total) by mouth 4 (four) times daily. For anxiety, alcohol use, pain 12/04/16 01/03/17  Eksir, Bo Mcclintock, MD  hydrOXYzine (ATARAX/VISTARIL) 50 MG tablet Take 1 tablet (50 mg total) by mouth 3 (three) times daily as needed for anxiety. 12/04/16   Eksir, Bo Mcclintock, MD  Multiple Vitamin (MULTIVITAMIN WITH MINERALS) TABS tablet Take 1 tablet by mouth daily. 12/05/16   Eksir, Bo Mcclintock, MD  nicotine polacrilex (NICORETTE) 2 MG gum Take 1 each (2 mg total) by mouth as needed for smoking cessation. 12/04/16   Burnard Leigh, MD  pantoprazole (PROTONIX) 40 MG tablet Take 1 tablet (40 mg total) by mouth daily. 12/04/16 01/03/17  Burnard Leigh, MD  prazosin (MINIPRESS) 1 MG capsule Take 3 capsules (3 mg total) by mouth at bedtime. For nightmares 12/04/16   Armandina Stammer I, NP  QUEtiapine (SEROQUEL) 100 MG tablet Take 1 tablet (100 mg total)  by mouth at bedtime. 12/04/16 01/03/17  Burnard Leigh, MD  QUEtiapine (SEROQUEL) 50 MG tablet Take 1 tablet (50 mg total) by mouth 2 (two) times daily. 12/04/16 01/03/17  Burnard Leigh, MD  sertraline (ZOLOFT) 100 MG tablet Take 2 tablets (200 mg total) by mouth daily. For depression 12/04/16   Armandina Stammer I, NP  thiamine 100 MG tablet Take 1 tablet (100 mg total) by mouth daily. 12/05/16   Burnard Leigh, MD  traZODone (DESYREL) 100 MG tablet Take 1 tablet (100 mg total) by mouth at bedtime. 12/04/16    Eksir, Bo Mcclintock, MD    Family History Family History  Problem Relation Age of Onset  . Alcohol abuse Father   . Cancer Maternal Aunt     Social History Social History  Substance Use Topics  . Smoking status: Current Every Day Smoker    Packs/day: 1.00    Types: Cigarettes    Start date: 10/22/1981  . Smokeless tobacco: Never Used  . Alcohol use 7.2 oz/week    12 Cans of beer per week     Comment: heavy     Allergies   Lithium and Penicillins   Review of Systems Review of Systems  Unable to perform ROS: Psychiatric disorder  Skin:       Currently treated for cellulitis of left leg which is improving he's taken 2 days of a prescriptionof Keflex  Allergic/Immunologic:       Current on Tetanusimmunization  Psychiatric/Behavioral: Positive for dysphoric mood and suicidal ideas.     Physical Exam Updated Vital Signs BP (!) 156/89 (BP Location: Left Arm)   Pulse 99   Temp 98.4 F (36.9 C) (Oral)   Resp 16   Ht 5\' 10"  (1.778 m)   Wt 92.1 kg (203 lb)   SpO2 100%   BMI 29.13 kg/m   Physical Exam  Constitutional:  unkempt  HENT:  Head: Normocephalic and atraumatic.  Eyes: Pupils are equal, round, and reactive to light. Conjunctivae are normal.  Neck: Neck supple. No tracheal deviation present. No thyromegaly present.  Cardiovascular: Normal rate and regular rhythm.   No murmur heard. Pulmonary/Chest: Effort normal and breath sounds normal.  Abdominal: Soft. Bowel sounds are normal. He exhibits no distension. There is no tenderness.  Musculoskeletal: Normal range of motion. He exhibits no edema or tenderness.  Neurological: He is alert. Coordination normal.  Gait normal  Skin: Skin is warm and dry. Capillary refill takes less than 2 seconds. No rash noted.  Left upper extremity with fresh needle mark at 4 forearm. No surrounding redness swelling or tenderness. Left lower extremity minimally reddened below the knee without swelling or red streaks proximal to  the knee. Nontender.DP pulse 2+. Good capillary refill All other extremity is without redness swelling or tenderness neurovascularly intact  Psychiatric: He has a normal mood and affect.  Nursing note and vitals reviewed.    ED Treatments / Results  Labs (all labs ordered are listed, but only abnormal results are displayed) Labs Reviewed  COMPREHENSIVE METABOLIC PANEL  ETHANOL  SALICYLATE LEVEL  ACETAMINOPHEN LEVEL  CBC  RAPID URINE DRUG SCREEN, HOSP PERFORMED    EKG  EKG Interpretation None      Date: 06/15/2017  Rate: 95  Rhythm: normal sinus rhythm  QRS Axis: normal  Intervals: normal  ST/T Wave abnormalities: normal  Conduction Disutrbances: none  Narrative Interpretation: unremarkable     Radiology No results found.  Procedures Procedures (including critical care time)  Medications  Ordered in ED Medications  buPROPion (WELLBUTRIN XL) 24 hr tablet 150 mg (not administered)  cephALEXin (KEFLEX) capsule 500 mg (not administered)  gabapentin (NEURONTIN) capsule 800 mg (not administered)  hydrOXYzine (ATARAX/VISTARIL) tablet 50 mg (not administered)  multivitamin with minerals tablet 1 tablet (not administered)  nicotine polacrilex (NICORETTE) gum 2 mg (not administered)  pantoprazole (PROTONIX) EC tablet 40 mg (not administered)  prazosin (MINIPRESS) capsule 3 mg (not administered)  QUEtiapine (SEROQUEL) tablet 100 mg (not administered)  sertraline (ZOLOFT) tablet 200 mg (not administered)  traZODone (DESYREL) tablet 100 mg (not administered)    Results for orders placed or performed during the hospital encounter of 06/15/17  Comprehensive metabolic panel  Result Value Ref Range   Sodium 136 135 - 145 mmol/L   Potassium 3.9 3.5 - 5.1 mmol/L   Chloride 101 101 - 111 mmol/L   CO2 25 22 - 32 mmol/L   Glucose, Bld 96 65 - 99 mg/dL   BUN 9 6 - 20 mg/dL   Creatinine, Ser 4.09 0.61 - 1.24 mg/dL   Calcium 9.2 8.9 - 81.1 mg/dL   Total Protein 8.2 (H) 6.5 -  8.1 g/dL   Albumin 4.4 3.5 - 5.0 g/dL   AST 41 15 - 41 U/L   ALT 37 17 - 63 U/L   Alkaline Phosphatase 81 38 - 126 U/L   Total Bilirubin 0.6 0.3 - 1.2 mg/dL   GFR calc non Af Amer >60 >60 mL/min   GFR calc Af Amer >60 >60 mL/min   Anion gap 10 5 - 15  Ethanol  Result Value Ref Range   Alcohol, Ethyl (B) <5 <5 mg/dL  Salicylate level  Result Value Ref Range   Salicylate Lvl <7.0 2.8 - 30.0 mg/dL  Acetaminophen level  Result Value Ref Range   Acetaminophen (Tylenol), Serum <10 (L) 10 - 30 ug/mL  cbc  Result Value Ref Range   WBC 16.3 (H) 4.0 - 10.5 K/uL   RBC 4.84 4.22 - 5.81 MIL/uL   Hemoglobin 15.7 13.0 - 17.0 g/dL   HCT 91.4 78.2 - 95.6 %   MCV 91.5 78.0 - 100.0 fL   MCH 32.4 26.0 - 34.0 pg   MCHC 35.4 30.0 - 36.0 g/dL   RDW 21.3 08.6 - 57.8 %   Platelets 343 150 - 400 K/uL  Rapid urine drug screen (hospital performed)  Result Value Ref Range   Opiates POSITIVE (A) NONE DETECTED   Cocaine NONE DETECTED NONE DETECTED   Benzodiazepines NONE DETECTED NONE DETECTED   Amphetamines POSITIVE (A) NONE DETECTED   Tetrahydrocannabinol POSITIVE (A) NONE DETECTED   Barbiturates NONE DETECTED NONE DETECTED   No results found. Initial Impression / Assessment and Plan / ED Course  I have reviewed the triage vital signs and the nursing notes.  Pertinent labs & imaging results that were available during my care of the patient were reviewed by me and considered in my medical decision making (see chart for details).     Patient is medically cleared for psychiatric evaluation Clinically cellulitis is improving. We will continue Keflex for an additional 5 days Final Clinical Impressions(s) / ED Diagnoses  Diagnosis #1 suicidal ideation #2 polysubstance abuse #3cellulitis of left leg Final diagnoses:  None  New Prescriptions New Prescriptions   No medications on file     Doug Sou, MD 06/15/17 1425    Doug Sou, MD 06/15/17 1443

## 2017-06-15 NOTE — ED Notes (Signed)
Bed: [id number] Expected date:  Expected time:  Means of arrival:  Comments: Triage 4 

## 2017-06-15 NOTE — ED Notes (Signed)
Visitor in with pt

## 2017-06-15 NOTE — ED Notes (Signed)
Report to include Situation, Background, Assessment, and Recommendations received from Janie RN. Patient alert and oriented, warm and dry, in no acute distress. Patient denies SI, HI, AVH and pain. Patient made aware of Q15 minute rounds and security cameras for their safety. Patient instructed to come to me with needs or concerns.  

## 2017-06-15 NOTE — ED Notes (Signed)
Pt ambulatory wo room 34 w/o difficulty

## 2017-06-15 NOTE — BH Assessment (Signed)
[facility] Assessment Progress Note Case was staffed with Lord DNP who recommended patient be admitted inpatient as appropriate bed placement is investigated      

## 2017-06-15 NOTE — ED Notes (Signed)
Bed: [facility] Expected date:  Expected time:  Means of arrival:  Comments: 

## 2017-06-15 NOTE — ED Triage Notes (Signed)
Pt reports he wants treatment for SI and detox. Last drink etoh was 4 24oz beers 3 hours ago. Last use heroin was 2 hours ago. No HI.

## 2017-06-15 NOTE — ED Notes (Signed)
Ate 100% of supper, pt instructed not to lay down with gum in his mouth. Pt verbalized understanding.

## 2017-06-16 ENCOUNTER — Encounter (HOSPITAL_COMMUNITY): Payer: Self-pay | Admitting: *Deleted

## 2017-06-16 ENCOUNTER — Inpatient Hospital Stay (HOSPITAL_COMMUNITY)
Admission: AD | Admit: 2017-06-16 | Discharge: 2017-06-22 | DRG: 885 | Disposition: A | Discharge status: Home / Self Care | Payer: Medicare Other | Source: Intra-hospital | Attending: Psychiatry | Admitting: Psychiatry

## 2017-06-16 DIAGNOSIS — K7460 Unspecified cirrhosis of liver: Secondary | ICD-10-CM | POA: Diagnosis present

## 2017-06-16 DIAGNOSIS — F4310 Post-traumatic stress disorder, unspecified: Secondary | ICD-10-CM | POA: Diagnosis present

## 2017-06-16 DIAGNOSIS — R110 Nausea: Secondary | ICD-10-CM | POA: Diagnosis not present

## 2017-06-16 DIAGNOSIS — Z8619 Personal history of other infectious and parasitic diseases: Secondary | ICD-10-CM

## 2017-06-16 DIAGNOSIS — Z880 Allergy status to penicillin: Secondary | ICD-10-CM

## 2017-06-16 DIAGNOSIS — R440 Auditory hallucinations: Secondary | ICD-10-CM | POA: Diagnosis present

## 2017-06-16 DIAGNOSIS — Z818 Family history of other mental and behavioral disorders: Secondary | ICD-10-CM | POA: Diagnosis not present

## 2017-06-16 DIAGNOSIS — F19259 Other psychoactive substance dependence with psychoactive substance-induced psychotic disorder, unspecified: Secondary | ICD-10-CM | POA: Diagnosis present

## 2017-06-16 DIAGNOSIS — F1110 Opioid abuse, uncomplicated: Secondary | ICD-10-CM | POA: Diagnosis not present

## 2017-06-16 DIAGNOSIS — F1910 Other psychoactive substance abuse, uncomplicated: Secondary | ICD-10-CM | POA: Diagnosis not present

## 2017-06-16 DIAGNOSIS — Z79899 Other long term (current) drug therapy: Secondary | ICD-10-CM | POA: Diagnosis not present

## 2017-06-16 DIAGNOSIS — Z915 Personal history of self-harm: Secondary | ICD-10-CM | POA: Diagnosis not present

## 2017-06-16 DIAGNOSIS — R45851 Suicidal ideations: Secondary | ICD-10-CM | POA: Diagnosis present

## 2017-06-16 DIAGNOSIS — G4700 Insomnia, unspecified: Secondary | ICD-10-CM | POA: Diagnosis present

## 2017-06-16 DIAGNOSIS — Z811 Family history of alcohol abuse and dependence: Secondary | ICD-10-CM

## 2017-06-16 DIAGNOSIS — Z888 Allergy status to other drugs, medicaments and biological substances status: Secondary | ICD-10-CM

## 2017-06-16 DIAGNOSIS — Z590 Homelessness: Secondary | ICD-10-CM | POA: Diagnosis not present

## 2017-06-16 DIAGNOSIS — F332 Major depressive disorder, recurrent severe without psychotic features: Secondary | ICD-10-CM | POA: Diagnosis present

## 2017-06-16 DIAGNOSIS — F11259 Opioid dependence with opioid-induced psychotic disorder, unspecified: Secondary | ICD-10-CM | POA: Diagnosis present

## 2017-06-16 DIAGNOSIS — M549 Dorsalgia, unspecified: Secondary | ICD-10-CM | POA: Diagnosis not present

## 2017-06-16 DIAGNOSIS — F1024 Alcohol dependence with alcohol-induced mood disorder: Secondary | ICD-10-CM | POA: Diagnosis not present

## 2017-06-16 DIAGNOSIS — R5381 Other malaise: Secondary | ICD-10-CM | POA: Diagnosis not present

## 2017-06-16 DIAGNOSIS — F1210 Cannabis abuse, uncomplicated: Secondary | ICD-10-CM | POA: Diagnosis not present

## 2017-06-16 DIAGNOSIS — F411 Generalized anxiety disorder: Secondary | ICD-10-CM | POA: Diagnosis present

## 2017-06-16 DIAGNOSIS — M791 Myalgia: Secondary | ICD-10-CM | POA: Diagnosis not present

## 2017-06-16 DIAGNOSIS — F17210 Nicotine dependence, cigarettes, uncomplicated: Secondary | ICD-10-CM | POA: Diagnosis present

## 2017-06-16 DIAGNOSIS — R5383 Other fatigue: Secondary | ICD-10-CM | POA: Diagnosis not present

## 2017-06-16 DIAGNOSIS — F1994 Other psychoactive substance use, unspecified with psychoactive substance-induced mood disorder: Secondary | ICD-10-CM | POA: Diagnosis not present

## 2017-06-16 DIAGNOSIS — F39 Unspecified mood [affective] disorder: Secondary | ICD-10-CM | POA: Diagnosis not present

## 2017-06-16 DIAGNOSIS — R109 Unspecified abdominal pain: Secondary | ICD-10-CM | POA: Diagnosis not present

## 2017-06-16 DIAGNOSIS — F1020 Alcohol dependence, uncomplicated: Secondary | ICD-10-CM | POA: Diagnosis not present

## 2017-06-16 DIAGNOSIS — B1920 Unspecified viral hepatitis C without hepatic coma: Secondary | ICD-10-CM | POA: Diagnosis not present

## 2017-06-16 DIAGNOSIS — M2550 Pain in unspecified joint: Secondary | ICD-10-CM | POA: Diagnosis not present

## 2017-06-16 DIAGNOSIS — F1920 Other psychoactive substance dependence, uncomplicated: Secondary | ICD-10-CM | POA: Diagnosis not present

## 2017-06-16 MED ORDER — DICYCLOMINE HCL 20 MG PO TABS
20.0000 mg | ORAL_TABLET | Freq: Four times a day (QID) | ORAL | Status: AC | PRN
  Administered 2017-06-17 – 2017-06-20 (×5): 20 mg via ORAL
  Filled 2017-06-16 (×5): qty 1

## 2017-06-16 MED ORDER — NICOTINE 21 MG/24HR TD PT24
21.0000 mg | MEDICATED_PATCH | Freq: Every day | TRANSDERMAL | Status: DC
  Administered 2017-06-16 – 2017-06-22 (×7): 21 mg via TRANSDERMAL
  Filled 2017-06-16 (×9): qty 1

## 2017-06-16 MED ORDER — GABAPENTIN 300 MG PO CAPS
300.0000 mg | ORAL_CAPSULE | Freq: Three times a day (TID) | ORAL | Status: DC
  Administered 2017-06-16 – 2017-06-18 (×6): 300 mg via ORAL
  Filled 2017-06-16 (×12): qty 1

## 2017-06-16 MED ORDER — LOPERAMIDE HCL 2 MG PO CAPS: 2.0000 mg | ORAL_CAPSULE | ORAL | Status: AC | PRN

## 2017-06-16 MED ORDER — ENSURE ENLIVE PO LIQD
237.0000 mL | Freq: Two times a day (BID) | ORAL | Status: DC
  Administered 2017-06-18 – 2017-06-19 (×3): 237 mL via ORAL

## 2017-06-16 MED ORDER — CLONIDINE HCL 0.1 MG PO TABS
0.1000 mg | ORAL_TABLET | Freq: Every day | ORAL | Status: AC
  Administered 2017-06-21 – 2017-06-22 (×2): 0.1 mg via ORAL
  Filled 2017-06-16 (×2): qty 1

## 2017-06-16 MED ORDER — ONDANSETRON 4 MG PO TBDP
4.0000 mg | ORAL_TABLET | Freq: Four times a day (QID) | ORAL | Status: AC | PRN
  Administered 2017-06-16 – 2017-06-20 (×5): 4 mg via ORAL
  Filled 2017-06-16 (×6): qty 1

## 2017-06-16 MED ORDER — CHLORDIAZEPOXIDE HCL 25 MG PO CAPS
25.0000 mg | ORAL_CAPSULE | Freq: Four times a day (QID) | ORAL | Status: AC | PRN
  Administered 2017-06-16 – 2017-06-19 (×4): 25 mg via ORAL
  Filled 2017-06-16 (×3): qty 1

## 2017-06-16 MED ORDER — ADULT MULTIVITAMIN W/MINERALS CH
1.0000 | ORAL_TABLET | Freq: Every day | ORAL | Status: DC
  Administered 2017-06-16 – 2017-06-22 (×7): 1 via ORAL
  Filled 2017-06-16 (×10): qty 1

## 2017-06-16 MED ORDER — VITAMIN B-1 100 MG PO TABS
100.0000 mg | ORAL_TABLET | Freq: Every day | ORAL | Status: DC
  Administered 2017-06-17 – 2017-06-22 (×6): 100 mg via ORAL
  Filled 2017-06-16 (×8): qty 1

## 2017-06-16 MED ORDER — MAGNESIUM HYDROXIDE 400 MG/5ML PO SUSP: 30.0000 mL | Freq: Every day | ORAL | Status: DC | PRN

## 2017-06-16 MED ORDER — CLONIDINE HCL 0.1 MG PO TABS
0.1000 mg | ORAL_TABLET | ORAL | Status: AC
  Administered 2017-06-18 – 2017-06-20 (×4): 0.1 mg via ORAL
  Filled 2017-06-16 (×5): qty 1

## 2017-06-16 MED ORDER — ALUM & MAG HYDROXIDE-SIMETH 200-200-20 MG/5ML PO SUSP
30.0000 mL | ORAL | Status: DC | PRN
  Administered 2017-06-21: 30 mL via ORAL
  Filled 2017-06-16: qty 30

## 2017-06-16 MED ORDER — NAPROXEN 500 MG PO TABS
500.0000 mg | ORAL_TABLET | Freq: Two times a day (BID) | ORAL | Status: DC | PRN
  Administered 2017-06-16 – 2017-06-22 (×10): 500 mg via ORAL
  Filled 2017-06-16 (×10): qty 1

## 2017-06-16 MED ORDER — CHLORDIAZEPOXIDE HCL 25 MG PO CAPS
25.0000 mg | ORAL_CAPSULE | Freq: Once | ORAL | Status: AC
  Administered 2017-06-16: 25 mg via ORAL
  Filled 2017-06-16: qty 1

## 2017-06-16 MED ORDER — HYDROXYZINE HCL 25 MG PO TABS
25.0000 mg | ORAL_TABLET | Freq: Four times a day (QID) | ORAL | Status: DC | PRN
  Administered 2017-06-16 – 2017-06-21 (×8): 25 mg via ORAL
  Filled 2017-06-16 (×10): qty 1

## 2017-06-16 MED ORDER — ACETAMINOPHEN 325 MG PO TABS
650.0000 mg | ORAL_TABLET | Freq: Four times a day (QID) | ORAL | Status: DC | PRN
  Filled 2017-06-16: qty 2

## 2017-06-16 MED ORDER — CLONIDINE HCL 0.1 MG PO TABS
0.1000 mg | ORAL_TABLET | Freq: Four times a day (QID) | ORAL | Status: AC
  Administered 2017-06-16 – 2017-06-18 (×7): 0.1 mg via ORAL
  Filled 2017-06-16 (×9): qty 1

## 2017-06-16 MED ORDER — METHOCARBAMOL 500 MG PO TABS
500.0000 mg | ORAL_TABLET | Freq: Three times a day (TID) | ORAL | Status: AC | PRN
  Administered 2017-06-16 – 2017-06-20 (×9): 500 mg via ORAL
  Filled 2017-06-16 (×9): qty 1

## 2017-06-16 NOTE — ED Notes (Signed)
Hourly rounding reveals patient sleeping in room. No complaints, stable, in no acute distress. Q15 minute rounds and monitoring via Security Cameras to continue. 

## 2017-06-16 NOTE — Progress Notes (Signed)
D    Pt was asleep during the first part of the shift   He woke up for medications and snacks   He was calm and cooperative but does endorse irritability and increased agitation being around a lot of people A   Verbal support given   Medications administered and effectiveness monitored  Q 15 min checks   Discussed withdrawal symptoms and medications to relieve them R    Pt is safe at present and receptive to verbal support

## 2017-06-16 NOTE — [facility] Group Notes (Signed)
[facility] Group Notes:  (Nursing/MHT/Case Management/Adjunct)  Date:  06/16/2017  Time:  1430  Type of Therapy:  Nurse Education  Participation Level:  Did Not Attend  Participation Quality:    Affect:    Cognitive:    Insight:    Engagement in Group:    Modes of Intervention:    Summary of Progress/Problems: Patient invited however elected to remain in bed.  Lawrence Marseilles 06/16/2017, 3:26 PM

## 2017-06-16 NOTE — Progress Notes (Signed)
Patient ID: [name] [name], male   DOB: 11-21-1975, 41 y.o.   MRN: [id number] Per State regulations 482.30 this chart was reviewed for medical necessity with respect to the patient's admission/duration of stay.    Next review date: 06/20/17  Thurman Coyer, BSN, RN-BC  Case Manager

## 2017-06-16 NOTE — Progress Notes (Addendum)
Patient has been sleeping since admission at noon. At 1600, patient up at med window complaining of withdrawal - skin crawling, restlessness, anxiety. Very irritable and upset that he is not on a protocol for alcohol detox. "That's the whole reason I'm here. I drink everyday. That lab work is wrong. I had 4-5 beers yesterday morning before I went to the ED." Explained to patient that this writer had talked with T Money, NP regarding patient's report that he is a daily drinker. Because etoh level was <5, an alcohol protocol was not initiated. Patient responded, "well that's fucking bullshit." Patient agitated, angry. Prn vistaril and zofran given along with scheduled neurontin and clonidine. VSS. Will continue to monitor and redirect behavior as needed.

## 2017-06-16 NOTE — Progress Notes (Signed)
Admit note: Patient vol admitted after receiving med clearance at [facility]. Patient presents requesting detox from heroin. Also reports using cocaine (UDS+) and alcohol with last drink the morning of 8.25.18 however level is negative. Patient also positive for methamphetimines. Patient states he has SI to jump from a bridge however denies at present "now that I'm here." Reports 20 pound weight loss due to decreased appetite. Has been off meds x 4 months as sub abuse has escalated. Denies AVH. States his only PMH is withdrawal related seizures. VSS, denies pain. Reports feeling anxious, nauseated and sensation that skin is crawling. COWS is a "6". Affect irritable with congruent mood. Patient resistant to assessment, admission process but mostly complied. Patient currently living in a boarding house where he states substance use occurs.  Skin assessed and belongings searched and secured. Oriented to unit and emotional support offered. Level III obs initiated. Fall prevention plan in place and patient reports hx of falls. (Unit currently out of fall risk bands.) Tanika NP and Feliz Beam NP made aware of patient's admission.  Patient verbalizes understanding of admission information, fall prevention plan. Currently resting in bed. Will continue to monitor and make verbal contact frequently.

## 2017-06-16 NOTE — Tx Team (Signed)
Initial Treatment Plan 06/16/2017 12:36 PM [name] [name] [id number]    PATIENT STRESSORS: Financial difficulties Medication change or noncompliance Substance abuse   PATIENT STRENGTHS: Average or above average intelligence Communication skills Supportive family/friends   PATIENT IDENTIFIED PROBLEMS:   "To get off drugs and alcohol."    "To get back on meds."               DISCHARGE CRITERIA:  Adequate post-discharge living arrangements Improved stabilization in mood, thinking, and/or behavior Need for constant or close observation no longer present Verbal commitment to aftercare and medication compliance Withdrawal symptoms are absent or subacute and managed without 24-hour nursing intervention  PRELIMINARY DISCHARGE PLAN: Attend 12-step recovery group Outpatient therapy Placement in alternative living arrangements  PATIENT/FAMILY INVOLVEMENT: This treatment plan has been presented to and reviewed with the patient, [name] [name], and/or family member.  The patient and family have been given the opportunity to ask questions and make suggestions.  Lawrence Marseilles, RN 06/16/2017, 12:36 PM

## 2017-06-16 NOTE — ED Notes (Signed)
Transport to [facility] in [place].

## 2017-06-17 DIAGNOSIS — R5381 Other malaise: Secondary | ICD-10-CM

## 2017-06-17 DIAGNOSIS — R110 Nausea: Secondary | ICD-10-CM

## 2017-06-17 DIAGNOSIS — F1024 Alcohol dependence with alcohol-induced mood disorder: Secondary | ICD-10-CM

## 2017-06-17 DIAGNOSIS — G4700 Insomnia, unspecified: Secondary | ICD-10-CM

## 2017-06-17 DIAGNOSIS — M549 Dorsalgia, unspecified: Secondary | ICD-10-CM

## 2017-06-17 DIAGNOSIS — R5383 Other fatigue: Secondary | ICD-10-CM

## 2017-06-17 DIAGNOSIS — Z811 Family history of alcohol abuse and dependence: Secondary | ICD-10-CM

## 2017-06-17 DIAGNOSIS — F1920 Other psychoactive substance dependence, uncomplicated: Secondary | ICD-10-CM

## 2017-06-17 DIAGNOSIS — M791 Myalgia: Secondary | ICD-10-CM

## 2017-06-17 DIAGNOSIS — M2550 Pain in unspecified joint: Secondary | ICD-10-CM

## 2017-06-17 DIAGNOSIS — F17210 Nicotine dependence, cigarettes, uncomplicated: Secondary | ICD-10-CM

## 2017-06-17 DIAGNOSIS — R109 Unspecified abdominal pain: Secondary | ICD-10-CM

## 2017-06-17 DIAGNOSIS — F419 Anxiety disorder, unspecified: Secondary | ICD-10-CM

## 2017-06-17 DIAGNOSIS — R45851 Suicidal ideations: Secondary | ICD-10-CM

## 2017-06-17 MED ORDER — CHLORDIAZEPOXIDE HCL 25 MG PO CAPS
25.0000 mg | ORAL_CAPSULE | ORAL | Status: AC
  Administered 2017-06-19 – 2017-06-20 (×2): 25 mg via ORAL
  Filled 2017-06-17 (×3): qty 1

## 2017-06-17 MED ORDER — QUETIAPINE FUMARATE 50 MG PO TABS
50.0000 mg | ORAL_TABLET | Freq: Two times a day (BID) | ORAL | Status: DC
  Administered 2017-06-17 – 2017-06-21 (×8): 50 mg via ORAL
  Filled 2017-06-17 (×14): qty 1

## 2017-06-17 MED ORDER — QUETIAPINE FUMARATE 25 MG PO TABS
25.0000 mg | ORAL_TABLET | Freq: Two times a day (BID) | ORAL | Status: DC
  Filled 2017-06-17 (×2): qty 1

## 2017-06-17 MED ORDER — CHLORDIAZEPOXIDE HCL 25 MG PO CAPS
25.0000 mg | ORAL_CAPSULE | Freq: Three times a day (TID) | ORAL | Status: AC
  Administered 2017-06-18 (×3): 25 mg via ORAL
  Filled 2017-06-17 (×3): qty 1

## 2017-06-17 MED ORDER — CHLORDIAZEPOXIDE HCL 25 MG PO CAPS
25.0000 mg | ORAL_CAPSULE | Freq: Four times a day (QID) | ORAL | Status: AC
  Administered 2017-06-17 (×3): 25 mg via ORAL
  Filled 2017-06-17 (×3): qty 1

## 2017-06-17 MED ORDER — CHLORDIAZEPOXIDE HCL 25 MG PO CAPS
25.0000 mg | ORAL_CAPSULE | Freq: Every day | ORAL | Status: AC
  Administered 2017-06-20: 25 mg via ORAL
  Filled 2017-06-17: qty 1

## 2017-06-17 MED ORDER — THIAMINE HCL 100 MG/ML IJ SOLN: 100.0000 mg | Freq: Once | INTRAMUSCULAR | Status: DC

## 2017-06-17 MED ORDER — SERTRALINE HCL 50 MG PO TABS
50.0000 mg | ORAL_TABLET | Freq: Every day | ORAL | Status: DC
  Administered 2017-06-18 – 2017-06-19 (×2): 50 mg via ORAL
  Filled 2017-06-17 (×4): qty 1
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
## 2017-06-17 NOTE — [facility] Suicide Risk Assessment (Signed)
[facility] INPATIENT:  Family/Significant Other Suicide Prevention Education  Suicide Prevention Education:  Patient Refusal for Family/Significant Other Suicide Prevention Education: The patient [name] [name] has refused to provide written consent for family/significant other to be provided Family/Significant Other Suicide Prevention Education during admission and/or prior to discharge.  Physician notified.  SPE completed with pt, as pt refused to consent to family contact. SPI pamphlet provided to pt and pt was encouraged to share information with support network, ask questions, and talk about any concerns relating to SPE. Pt denies access to guns/firearms and verbalized understanding of information provided. Mobile Crisis information also provided to pt.   Yuritza Paulhus N Smart LCSW 06/17/2017, 9:39 AM

## 2017-06-17 NOTE — Progress Notes (Signed)
Patient did not attend AA group. 

## 2017-06-17 NOTE — Progress Notes (Signed)
Recreation Therapy Notes  Date: 06/17/17 Time: 0915 Location: 300 Hall Group Room  Group Topic: Stress Management  Goal Area(s) Addresses:  Patient will verbalize importance of using healthy stress management.  Patient will identify positive emotions associated with healthy stress management.   Intervention: Stress Management  Activity :  Body Scan Meditation.  LRT introduced the stress management technique of meditation.  LRT played a meditation from the Calm app to allow patients to take inventory of any tension, calmness or other sensations they may have been feeling.  Patients were to follow along as the meditation played.  Education:  Stress Management, Discharge Planning.   Education Outcome: Acknowledges edcuation/In group clarification offered/Needs additional education  Clinical Observations/Feedback: Pt did not attend group.    Caroll Rancher, LRT/CTRS         Caroll Rancher A 06/17/2017 11:51 AM

## 2017-06-17 NOTE — B[facility] Suicide Risk Assessment (Signed)
[facility] Admission Suicide Risk Assessment   Nursing information obtained from:  Patient, Review of record Demographic factors:  Male, Divorced or widowed, Caucasian, Low socioeconomic status Current Mental Status:  Suicidal ideation indicated by patient, Suicide plan, Plan includes specific time, place, or method, Self-harm thoughts, Intention to act on suicide plan Loss Factors:  Financial problems / change in socioeconomic status Historical Factors:  Prior suicide attempts, Family history of mental illness or substance abuse Risk Reduction Factors:  Employed  Total Time spent with patient: 45 minutes Principal Problem: MDD, Alcohol Dependence, Opiate Dependence Diagnosis:   Patient Active Problem List   Diagnosis Date Noted  . Alcohol use disorder, severe, dependence (HCC) [F10.20]   . Major depressive disorder, recurrent severe without psychotic features (HCC) [F33.2] 12/03/2016  . ETOH abuse [F10.10]   . Cirrhosis (HCC) [K74.60] 05/24/2015  . Suicidal ideation [R45.851]   . Recurrent cellulitis of lower leg [L03.119] 11/09/2014  . Venous (peripheral) insufficiency [I87.2] 11/09/2014  . Bipolar affective disorder (HCC) [F31.9]   . Alcohol dependence with alcohol-induced mood disorder (HCC) [F10.24]   . Opioid dependence with opioid-induced mood disorder (HCC) [F11.24]   . Severe recurrent major depression without psychotic features (HCC) [F33.2] 09/09/2014  . MDD (major depressive disorder) [F32.9] 09/09/2014  . Suicidal ideations [R45.851]   . Chronic pain syndrome [G89.4] 05/31/2014  . Tobacco dependence [F17.200] 05/30/2014  . Poor dentition [K08.9] 02/03/2014  . Hepatitis C virus infection without hepatic coma [B19.20] 02/03/2014  . Sepsis (HCC) [A41.9] 01/29/2014  . Anxiety state, unspecified [F41.1] 01/31/2013  . Mood disorder in conditions classified elsewhere [F06.30] 01/31/2013  . Polysubstance (including opioids) dependence with physiological dependence (HCC) [F19.20]  01/27/2013    Class: Acute  . Alcohol withdrawal (HCC) [F10.239] 01/27/2013    Class: Acute     Continued Clinical Symptoms:    The "Alcohol Use Disorders Identification Test", Guidelines for Use in Primary Care, Second Edition.  World Science writer [facility]). Score between 0-7:  no or low risk or alcohol related problems. Score between 8-15:  moderate risk of alcohol related problems. Score between 16-19:  high risk of alcohol related problems. Score 20 or above:  warrants further diagnostic evaluation for alcohol dependence and treatment.   CLINICAL FACTORS:  41 year old male, single, has two adult children, currently homeless   He presented to ED on 8/25 due to worsening depression, suicidal ideations, with thoughts of jumping off a bridge, and requesting detoxification.  States he had been doing well for several weeks after prior admission in February, but relapsed in May and stopped taking psychiatric medications . Reports intermittent auditory hallucinations, telling him to kill self .History of alcohol and Opiate ( Heroin) dependencies . Reports he has been drinking up to a fifth of liquor daily. States that over the last three weeks or so he has also been using heroin ( mostly insufflated but sometimes IV) daily.   Reports history of depression, has attempted suicide in the past, by overdosing .  He has history of prior psychiatric admissions, most recently in February/.2018. At the time was admitted for depression, polysubstance dependence.He was discharged on Minipress, Seroquel, Zoloft, Wellbutrin, Neurontin. States " the medications were working well for me ". He has a history of  substance use disorder , reports alcohol is substance of choice, to a lesser degree also, opiates     ( heroin - uses IV at times ) and cocaine.   Medical history is remarkable for history of episodes of cellulitis .  Dx- Alcohol Use Disorder, Opiate Use Disorder, Substance Induced Mood Disorder   Versus MDD, with psychotic features   Plan - inpatient admission -Continue  Librium Detox Protocol for alcohol WDL management, on Clonidine Detox Protocol for opiate WDL management ,  He has been restarted on Neurontin at 300 mgrs TID ( reports he was taking 800 mgrs QID prior to admission, will titrate gradually as tolerated).  Reports history of good response to Zoloft . Restart Zoloft 50 mgrs QDAY.  Patient reports history of good response to Wellbutrin in the past, but will not restart at this time because it may lower seizure threshold in the context of current WDL. Patient reports good response to Seroquel in the past  Restart Seroquel 25 mgrs BID initially - titrate gradually as tolerated   Musculoskeletal: Strength & Muscle Tone: within normal limits mild distal tremors, no diaphoresis, no restlessness  Gait & Station: normal Patient leans: N/A  Psychiatric Specialty Exam: Physical Exam  ROS mild headache, no chest pain, no shortness of breath, no vomiting , (+) nausea, (+) loose stools   Blood pressure 117/78, pulse 79, temperature 97.6 F (36.4 C), temperature source Oral, resp. rate 16, height 5\' 10"  (1.778 m), weight 85.7 kg (189 lb), SpO2 97 %.Body mass index is 27.12 kg/m.  General Appearance: Fairly Groomed  Eye Contact:  Good  Speech:  Normal Rate  Volume:  Decreased  Mood:  depressed, sad  Affect:  constricted, mildly irritable  Thought Process:  Linear and Descriptions of Associations: Intact  Orientation:  Other:  fully oriented x 3   Thought Content:  reports lingering auditory hallucinations, does not appear internally preoccupied, no delusions expressed   Suicidal Thoughts:  No denies any suicidal plan or intention at this time and contracts for safety on unit   Homicidal Thoughts:  No denies homicidal ideations   Memory:  recent and remote grossly intact   Judgement:  Fair  Insight:  Fair  Psychomotor Activity:  Normal- mild distal tremors, no restlessness    Concentration:  Concentration: Good and Attention Span: Good  Recall:  Good  Fund of Knowledge:  Good  Language:  Good  Akathisia:  No  Handed:  Right  AIMS (if indicated):     Assets:  Communication Skills Desire for Improvement Resilience  ADL's:  Intact  Cognition:  WNL  Sleep:  Number of Hours: 4      COGNITIVE FEATURES THAT CONTRIBUTE TO RISK:  Closed-mindedness and Loss of executive function    SUICIDE RISK:   Moderate:  Frequent suicidal ideation with limited intensity, and duration, some specificity in terms of plans, no associated intent, good self-control, limited dysphoria/symptomatology, some risk factors present, and identifiable protective factors, including available and accessible social support.  PLAN OF CARE: Patient will be admitted to inpatient psychiatric unit for stabilization and safety. Will provide and encourage milieu participation. Provide medication management and maked adjustments as needed.  Will also provide medication management to minimize risk of withdrawal . Will follow daily.    I certify that inpatient services furnished can reasonably be expected to improve the patient's condition.   Craige Cotta, MD 06/17/2017, 12:48 PM

## 2017-06-17 NOTE — H&P (Signed)
Psychiatric Admission Assessment Adult  Patient Identification: [name] [name]  MRN:  [id number]  Date of Evaluation:  06/17/2017  Chief Complaint: Drug intoxications/suicidal ideations with plans to jump off a bridge..  Principal Diagnosis: Polysubstance dependence (including Opioid drugs) with physiological dependence  Diagnosis:   Patient Active Problem List   Diagnosis Date Noted  . Polysubstance (including opioids) dependence with physiological dependence [facility]) [F19.20] 01/27/2013    Priority: High    Class: Acute  . Alcohol withdrawal ([place]) [F10.239] 01/27/2013    Priority: High    Class: Acute  . Bipolar affective disorder ([place]) [F31.9]     Priority: Medium  . Alcohol use disorder, severe, dependence ([place]) [F10.20]   . Major depressive disorder, recurrent severe without psychotic features ([place]) [F33.2] 12/03/2016  . ETOH abuse [F10.10]   . Cirrhosis ([place]) [K74.60] 05/24/2015  . Suicidal ideation [R45.851]   . Recurrent cellulitis of lower leg [L03.119] 11/09/2014  . Venous (peripheral) insufficiency [I87.2] 11/09/2014  . Alcohol dependence with alcohol-induced mood disorder ([place]) [F10.24]   . Opioid dependence with opioid-induced mood disorder ([place]) [F11.24]   . Severe recurrent major depression without psychotic features ([place]) [F33.2] 09/09/2014  . MDD (major depressive disorder) [F32.9] 09/09/2014  . Suicidal ideations [R45.851]   . Chronic pain syndrome [G89.4] 05/31/2014  . Tobacco dependence [F17.200] 05/30/2014  . Poor dentition [K08.9] 02/03/2014  . Hepatitis C virus infection without hepatic coma [B19.20] 02/03/2014  . Sepsis ([place]) [A41.9] 01/29/2014  . Anxiety state, unspecified [F41.1] 01/31/2013  . Mood disorder in conditions classified elsewhere [F06.30] 01/31/2013   History of Present Illness:This is one of several  admission assessments from this hospital alone for this 41 year old Caucasian male with hx of mental illness & Polysubstance dependence since  teenage years. He is being admitted to the [facility] adult unit with complaints of suicidal ideations with plans to jump off a bridge to his death, which he states triggered by his relapse on drugs/excessive drug use. He says he is currently actively withdrawing from these substances (Amphetamine, Opioid & THC).   During this assessment, [name] [name] reports, "My dad took me to the hospital on Saturday. I have been binge drinking & using a lot of drugs x 4 months. I have been drinking a fifth of Vodka & bourbon daily. I was also using heroin via IV. I was smoking a lot of weed. I was off of my mental health medicines for 4 months since I relapsed on drugs. I'm getting very depressed due to my life style. I'm practically living on the streets. I'm hearing voices. Seroquel helps me".  Associated Signs/Symptoms: Depression Symptoms:  depressed mood, insomnia, feelings of worthlessness/guilt, hopelessness, suicidal thoughts with specific plan, anxiety,  (Hypo) Manic Symptoms:  Impulsivity, Irritable Mood, Labiality of Mood,  Anxiety Symptoms:  Excessive Worry,  Psychotic Symptoms:  Hallucinations: Auditory  PTSD Symptoms: Had a traumatic exposure:  "I saw my cousin shoot himself to death when I was a teenager" Re-experiencing:  Flashbacks Nightmares  Total Time spent with patient: 1 hour  Past Psychiatric History: Major depression, anxiety disorder, polysubstance dependence.  Is the patient at risk to self? Yes.   (able to contract for safety). Has the patient been a risk to self in the past 6 months? Yes.    Has the patient been a risk to self within the distant past? Yes.    Is the patient a risk to others? No.  Has the patient been a risk to others in the past 6 months?  No.  Has the patient been a risk to others within the distant past? No.   Prior Inpatient Therapy: Yes, Brought on Hospital years ago" Prior Outpatient Therapy: Yes (Dr. Lorel Monaco).  Alcohol Screening: Patient refused Alcohol  Screening Tool: Yes  Substance Abuse History in the last 12 months:  Yes.    Consequences of Substance Abuse: Medical Consequences:  Liver damage, Possible death by overdose Legal Consequences:  Arrests, jail time, Loss of driving privilege. Family Consequences:  Family discord, divorce and or separation.  Previous Psychotropic Medications: Seroquel, Wellbutrin, Sertraline, Gabapentin.   Psychological Evaluations: Yes   Past Medical History:  Past Medical History:  Diagnosis Date  . Bipolar disorder ([place])   . Cellulitis   . Depression   . Hepatitis C     Past Surgical History:  Procedure Laterality Date  . BACK SURGERY    . ROTATOR CUFF REPAIR    . SHOULDER SURGERY     Family History:  Family History  Problem Relation Age of Onset  . Alcohol abuse Father   . Cancer Maternal Aunt    Family Psychiatric  History: Schizophrenia: Mother.  Tobacco Screening: Have you used any form of tobacco in the last 30 days? (Cigarettes, Smokeless Tobacco, Cigars, and/or Pipes): Yes Tobacco use, Select all that apply: 5 or more cigarettes per day Are you interested in Tobacco Cessation Medications?: Yes, will notify MD for an order Counseled patient on smoking cessation including recognizing danger situations, developing coping skills and basic information about quitting provided: Refused/Declined practical counseling  Social History:  History  Alcohol Use  . 7.2 oz/week  . 12 Cans of beer per week    Comment: heavy     History  Drug Use No    Comment: denies at this time    Additional Social History: Patient sates is married with 2 children (girls). Currently having some marital conflicts. Patient is disabled due to back problems/surgeries. Currently collecting disability benefits. Lives in [place], [place].  Allergies:   Allergies  Allergen Reactions  . Lithium Anaphylaxis  . Penicillins Rash    Has patient had a PCN reaction causing immediate rash, facial/tongue/throat  swelling, SOB or lightheadedness with hypotension: Yes Has patient had a PCN reaction causing severe rash involving mucus membranes or skin necrosis: No Has patient had a PCN reaction that required hospitalization No Has patient had a PCN reaction occurring within the last 10 years: No If all of the above answers are "NO", then may proceed with Cephalosporin use.    Lab Results:  Results for orders placed or performed during the hospital encounter of 06/15/17 (from the past 48 hour(s))  Comprehensive metabolic panel     Status: Abnormal   Collection Time: 06/15/17  1:34 PM  Result Value Ref Range   Sodium 136 135 - 145 mmol/L   Potassium 3.9 3.5 - 5.1 mmol/L   Chloride 101 101 - 111 mmol/L   CO2 25 22 - 32 mmol/L   Glucose, Bld 96 65 - 99 mg/dL   BUN 9 6 - 20 mg/dL   Creatinine, Ser 0.77 0.61 - 1.24 mg/dL   Calcium 9.2 8.9 - 10.3 mg/dL   Total Protein 8.2 (H) 6.5 - 8.1 g/dL   Albumin 4.4 3.5 - 5.0 g/dL   AST 41 15 - 41 U/L   ALT 37 17 - 63 U/L   Alkaline Phosphatase 81 38 - 126 U/L   Total Bilirubin 0.6 0.3 - 1.2 mg/dL   GFR calc non Af Amer >  60 >60 mL/min   GFR calc Af Amer >60 >60 mL/min    Comment: (NOTE) The eGFR has been calculated using the CKD EPI equation. This calculation has not been validated in all clinical situations. eGFR's persistently <60 mL/min signify possible Chronic Kidney Disease.    Anion gap 10 5 - 15  Ethanol     Status: None   Collection Time: 06/15/17  1:34 PM  Result Value Ref Range   Alcohol, Ethyl (B) <5 <5 mg/dL    Comment:        LOWEST DETECTABLE LIMIT FOR SERUM ALCOHOL IS 5 mg/dL FOR MEDICAL PURPOSES ONLY   Salicylate level     Status: None   Collection Time: 06/15/17  1:34 PM  Result Value Ref Range   Salicylate Lvl <7.2 2.8 - 30.0 mg/dL  Acetaminophen level     Status: Abnormal   Collection Time: 06/15/17  1:34 PM  Result Value Ref Range   Acetaminophen (Tylenol), Serum <10 (L) 10 - 30 ug/mL    Comment:        THERAPEUTIC  CONCENTRATIONS VARY SIGNIFICANTLY. A RANGE OF 10-30 ug/mL MAY BE AN EFFECTIVE CONCENTRATION FOR MANY PATIENTS. HOWEVER, SOME ARE BEST TREATED AT CONCENTRATIONS OUTSIDE THIS RANGE. ACETAMINOPHEN CONCENTRATIONS >150 ug/mL AT 4 HOURS AFTER INGESTION AND >50 ug/mL AT 12 HOURS AFTER INGESTION ARE OFTEN ASSOCIATED WITH TOXIC REACTIONS.   cbc     Status: Abnormal   Collection Time: 06/15/17  1:34 PM  Result Value Ref Range   WBC 16.3 (H) 4.0 - 10.5 K/uL   RBC 4.84 4.22 - 5.81 MIL/uL   Hemoglobin 15.7 13.0 - 17.0 g/dL   HCT 44.3 39.0 - 52.0 %   MCV 91.5 78.0 - 100.0 fL   MCH 32.4 26.0 - 34.0 pg   MCHC 35.4 30.0 - 36.0 g/dL   RDW 13.7 11.5 - 15.5 %   Platelets 343 150 - 400 K/uL  Rapid urine drug screen (hospital performed)     Status: Abnormal   Collection Time: 06/15/17  1:44 PM  Result Value Ref Range   Opiates POSITIVE (A) NONE DETECTED   Cocaine NONE DETECTED NONE DETECTED   Benzodiazepines NONE DETECTED NONE DETECTED   Amphetamines POSITIVE (A) NONE DETECTED   Tetrahydrocannabinol POSITIVE (A) NONE DETECTED   Barbiturates NONE DETECTED NONE DETECTED    Comment:        DRUG SCREEN FOR MEDICAL PURPOSES ONLY.  IF CONFIRMATION IS NEEDED FOR ANY PURPOSE, NOTIFY LAB WITHIN 5 DAYS.        LOWEST DETECTABLE LIMITS FOR URINE DRUG SCREEN Drug Class       Cutoff (ng/mL) Amphetamine      1000 Barbiturate      200 Benzodiazepine   536 Tricyclics       644 Opiates          300 Cocaine          300 THC              50    Blood Alcohol level:  Lab Results  Component Value Date   ETH <5 06/15/2017   ETH 60 (H) 03/47/4259   Metabolic Disorder Labs:  Lab Results  Component Value Date   HGBA1C 5.8 (H) 12/03/2016   MPG 120 12/03/2016   MPG 126 (H) 10/21/2014   No results found for: PROLACTIN No results found for: CHOL, TRIG, HDL, CHOLHDL, VLDL, LDLCALC  Current Medications: Current Facility-Administered Medications  Medication Dose Route Frequency Provider Last Rate  Last Dose  . acetaminophen (TYLENOL) tablet 650 mg  650 mg Oral Q6H PRN Patrecia Pour, NP      . alum & mag hydroxide-simeth (MAALOX/MYLANTA) 200-200-20 MG/5ML suspension 30 mL  30 mL Oral Q4H PRN Patrecia Pour, NP      . chlordiazePOXIDE (LIBRIUM) capsule 25 mg  25 mg Oral Q6H PRN Lindon Romp A, NP   25 mg at 06/17/17 0813  . chlordiazePOXIDE (LIBRIUM) capsule 25 mg  25 mg Oral QID Linnie Mcglocklin, Myer Peer, MD       Followed by  . [START ON 06/19/2017] chlordiazePOXIDE (LIBRIUM) capsule 25 mg  25 mg Oral TID Caitlinn Klinker, Myer Peer, MD       Followed by  . [START ON 06/20/2017] chlordiazePOXIDE (LIBRIUM) capsule 25 mg  25 mg Oral BH-qamhs Najmah Carradine, Myer Peer, MD       Followed by  . [START ON 06/21/2017] chlordiazePOXIDE (LIBRIUM) capsule 25 mg  25 mg Oral Daily Bryan Omura A, MD      . cloNIDine (CATAPRES) tablet 0.1 mg  0.1 mg Oral QID Patrecia Pour, NP   0.1 mg at 06/17/17 0809   Followed by  . [START ON 06/18/2017] cloNIDine (CATAPRES) tablet 0.1 mg  0.1 mg Oral BH-qamhs Lord, Asa Saunas, NP       Followed by  . [START ON 06/21/2017] cloNIDine (CATAPRES) tablet 0.1 mg  0.1 mg Oral QAC breakfast Patrecia Pour, NP      . dicyclomine (BENTYL) tablet 20 mg  20 mg Oral Q6H PRN Patrecia Pour, NP   20 mg at 06/17/17 0246  . feeding supplement (ENSURE ENLIVE) (ENSURE ENLIVE) liquid 237 mL  237 mL Oral BID BM Rea Reser A, MD      . gabapentin (NEURONTIN) capsule 300 mg  300 mg Oral TID Patrecia Pour, NP   300 mg at 06/17/17 0809  . hydrOXYzine (ATARAX/VISTARIL) tablet 25 mg  25 mg Oral Q6H PRN Patrecia Pour, NP   25 mg at 06/16/17 1610  . loperamide (IMODIUM) capsule 2-4 mg  2-4 mg Oral PRN Patrecia Pour, NP      . magnesium hydroxide (MILK OF MAGNESIA) suspension 30 mL  30 mL Oral Daily PRN Patrecia Pour, NP      . methocarbamol (ROBAXIN) tablet 500 mg  500 mg Oral Q8H PRN Patrecia Pour, NP   500 mg at 06/17/17 1191  . multivitamin with minerals tablet 1 tablet  1 tablet Oral Daily  Lindon Romp A, NP   1 tablet at 06/17/17 0810  . naproxen (NAPROSYN) tablet 500 mg  500 mg Oral BID PRN Patrecia Pour, NP   500 mg at 06/17/17 0816  . nicotine (NICODERM CQ - dosed in mg/24 hours) patch 21 mg  21 mg Transdermal Daily Anibal Quinby, Myer Peer, MD   21 mg at 06/17/17 0810  . ondansetron (ZOFRAN-ODT) disintegrating tablet 4 mg  4 mg Oral Q6H PRN Patrecia Pour, NP   4 mg at 06/17/17 0246  . thiamine (B-1) injection 100 mg  100 mg Intramuscular Once Patrcia Schnepp A, MD      . thiamine (VITAMIN B-1) tablet 100 mg  100 mg Oral Daily Lindon Romp A, NP   100 mg at 06/17/17 0809   PTA Medications: Prescriptions Prior to Admission  Medication Sig Dispense Refill Last Dose  . buPROPion (WELLBUTRIN XL) 150 MG 24 hr tablet Take 150 mg XL daily for 1 week, then increase to 300  mg XL daily thereafter (Patient not taking: Reported on 06/15/2017) 45 tablet 0 Not Taking at Unknown time  . cephALEXin (KEFLEX) 500 MG capsule Take 1 capsule (500 mg total) by mouth 4 (four) times daily. 40 capsule 0 Past Week at Unknown time  . gabapentin (NEURONTIN) 400 MG capsule Take 2 capsules (800 mg total) by mouth 4 (four) times daily. For anxiety, alcohol use, pain (Patient not taking: Reported on 06/15/2017) 240 capsule 0 Not Taking at Unknown time  . gabapentin (NEURONTIN) 800 MG tablet Take 800 mg by mouth 4 (four) times daily.  1 06/15/2017 at Unknown time  . hydrOXYzine (ATARAX/VISTARIL) 50 MG tablet Take 1 tablet (50 mg total) by mouth 3 (three) times daily as needed for anxiety. (Patient not taking: Reported on 06/15/2017) 30 tablet 0 Not Taking at Unknown time  . Multiple Vitamin (MULTIVITAMIN WITH MINERALS) TABS tablet Take 1 tablet by mouth daily. (Patient not taking: Reported on 06/15/2017) 30 tablet 0 Not Taking at Unknown time  . nicotine polacrilex (NICORETTE) 2 MG gum Take 1 each (2 mg total) by mouth as needed for smoking cessation. (Patient not taking: Reported on 06/15/2017) 100 tablet 0 Not Taking at  Unknown time  . pantoprazole (PROTONIX) 40 MG tablet Take 1 tablet (40 mg total) by mouth daily. (Patient not taking: Reported on 06/15/2017) 30 tablet 0 Not Taking at Unknown time  . prazosin (MINIPRESS) 1 MG capsule Take 3 capsules (3 mg total) by mouth at bedtime. For nightmares (Patient not taking: Reported on 06/15/2017) 90 capsule 0 Not Taking at Unknown time  . QUEtiapine (SEROQUEL) 100 MG tablet Take 1 tablet (100 mg total) by mouth at bedtime. (Patient not taking: Reported on 06/15/2017) 30 tablet 0 Not Taking at Unknown time  . QUEtiapine (SEROQUEL) 50 MG tablet Take 1 tablet (50 mg total) by mouth 2 (two) times daily. (Patient not taking: Reported on 06/15/2017) 60 tablet 0 Not Taking at Unknown time  . sertraline (ZOLOFT) 100 MG tablet Take 2 tablets (200 mg total) by mouth daily. For depression (Patient not taking: Reported on 06/15/2017) 60 tablet 0 Not Taking at Unknown time  . thiamine 100 MG tablet Take 1 tablet (100 mg total) by mouth daily. (Patient not taking: Reported on 06/15/2017) 30 tablet 0 Not Taking at Unknown time  . traZODone (DESYREL) 100 MG tablet Take 1 tablet (100 mg total) by mouth at bedtime. (Patient not taking: Reported on 06/15/2017) 30 tablet 0 Not Taking at Unknown time   Musculoskeletal: Strength & Muscle Tone: within normal limits Gait & Station: normal Patient leans: N/A  Psychiatric Specialty Exam: Physical Exam  Constitutional: He appears well-developed.  HENT:  Head: Normocephalic.  Eyes: Pupils are equal, round, and reactive to light.  Neck: Normal range of motion.  Cardiovascular: Normal rate.   Respiratory: Effort normal.  GI: Soft.  Genitourinary:  Genitourinary Comments: Denies any issues in this area.  Musculoskeletal: Normal range of motion.  Neurological: He is alert.  Skin: Skin is warm.    Review of Systems  Constitutional: Positive for chills, diaphoresis and malaise/fatigue.  HENT: Negative.   Eyes: Positive for blurred vision.   Respiratory: Negative.   Cardiovascular: Negative.   Gastrointestinal: Positive for abdominal pain and nausea.  Genitourinary: Negative.   Musculoskeletal: Positive for back pain, joint pain and myalgias.  Skin: Negative.        "I feel like my skin is crawling".  Neurological: Positive for tremors and seizures (Hx. alcohol withdrawal seizures.).  Endo/Heme/Allergies: Negative.  Psychiatric/Behavioral: Positive for depression, substance abuse (UDS + for Amphetamine, opioid & THC ) and suicidal ideas (Able to contract for safety on the unit.). Negative for hallucinations and memory loss. The patient is nervous/anxious and has insomnia.     Blood pressure 120/82, pulse 79, temperature 97.6 F (36.4 C), temperature source Oral, resp. rate 16, height _0  (1.778 m), weight 85.7 kg (189 lb), SpO2 97 %.Body mass index is 27.12 kg/m.  General Appearance: Disheveled, restless, facial flushing, runny nose.  Eye Contact:  Fair  Speech:  Clear and Coherent and Normal Rate  Volume:  Normal  Mood:  Anxious, Depressed, Hopeless and Worthless  Affect:  Congruent  Thought Process:  Coherent and Descriptions of Associations: Intact  Orientation:  Full (Time, Place, and Person)  Thought Content:  Hallucinations: Auditory  Suicidal Thoughts:  Yes.  without intent/plan, able to contract for safety.  Homicidal Thoughts:  Currently denies any thoughts, plans or intent.  Memory:  Immediate;   Good Recent;   Good Remote;   Good  Judgement:  Fair  Insight:  Present  Psychomotor Activity:  Restlessness and Tremor  Concentration:  Concentration: Fair and Attention Span: Fair  Recall:  Good  Fund of Knowledge:  Fair  Language:  Good  Akathisia:  Negative  Handed:  Right  AIMS (if indicated):     Assets:  Communication Skills Desire for Improvement Social Support  ADL's:  Intact  Cognition:  WNL  Sleep:  Number of Hours: 4   Treatment Plan/Recommendations: 1. Admit for crisis management and  stabilization, estimated length of stay 3-5 days.   2. Medication management to reduce current symptoms to base line and improve the patient's overall level of functioning: See MAR, Md's SRA & treatment plan..   3. Treat health problems as indicated.  4. Develop treatment plan to decrease risk of relapse upon discharge and the need for readmission.  5. Psycho-social education regarding relapse prevention and self care.  6. Health care follow up as needed for medical problems.  7. Review, reconcile, and reinstate any pertinent home medications for other health issues where appropriate. 8. Call for consults with hospitalist for any additional specialty patient care services as needed.  Observation Level/Precautions:  15 minute checks  Laboratory:  Per ED, UDS + for Amphetamine, Opioid & THC.  Psychotherapy: Group sessions  Medications: See Mar  Consultations: As needed   Discharge Concerns: Safety, mood control, maintain sobriety.  Estimated LOS: 2-4 days  Other:  Admit to the [facility].   Physician Treatment Plan for Primary Diagnosis: Polysubstance dependence (including Opioid drugs) with physiological dependence: Will initiate medication management for drug/alcohol detoxification/mood stability. Set up an outpatient psychiatric services for medication management. Will encourage medication adherence with psychiatric medications.  Long Term Goal(s): Improvement in symptoms so as ready for discharge  Short Term Goals: Ability to identify changes in lifestyle to reduce recurrence of condition will improve, Ability to disclose and discuss suicidal ideas and Ability to demonstrate self-control will improve  Physician Treatment Plan for Secondary Diagnosis: Bipolar affective disorder.  Long Term Goal(s): Improvement in symptoms so as ready for discharge  Short Term Goals: Ability to identify and develop effective coping behaviors will improve, Compliance with prescribed medications will  improve and Ability to identify triggers associated with substance abuse/mental health issues will improve  I certify that inpatient services furnished can reasonably be expected to improve the patient's condition.    Encarnacion Slates, NP, PMHNP, FNP-BC 8/27/201810:23 AM Case reviewed with NP  and patient seen by me Agree with NP assessment  41 year old male, single, has two adult children, currently homeless   He presented to ED on 8/25 due to worsening depression, suicidal ideations, with thoughts of jumping off a bridge, and requesting detoxification.  States he had been doing well for several weeks after prior admission in February, but relapsed in May and stopped taking psychiatric medications . Reports intermittent auditory hallucinations, telling him to kill self .History of alcohol and Opiate ( Heroin) dependencies . Reports he has been drinking up to a fifth of liquor daily. States that over the last three weeks or so he has also been using heroin ( mostly insufflated but sometimes IV) daily.   Reports history of depression, has attempted suicide in the past, by overdosing .  He has history of prior psychiatric admissions, most recently in February/.2018. At the time was admitted for depression, polysubstance dependence.He was discharged on Minipress, Seroquel, Zoloft, Wellbutrin, Neurontin. States " the medications were working well for me ". He has a history of  substance use disorder , reports alcohol is substance of choice, to a lesser degree also, opiates     ( heroin - uses IV at times ) and cocaine.   Medical history is remarkable for history of episodes of cellulitis .  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  Reports history of good response to Zoloft . Restart Zoloft 50 mgrs QDAY.  Patient reports history of good response to Wellbutrin in the past, but will not restart at this time because it may lower seizure threshold in the context of current WDL. Patient reports good response to Seroquel in the past  Restart Seroquel 25 mgrs BID initially - titrate gradually as tolerated

## 2017-06-17 NOTE — Progress Notes (Signed)
Patient denies VH and HI but reports SI and AH.  Patient has been complaining of withdrawal symptoms which have been managed by the withdrawal protocol.   Assess patient for safety, offer medications as prescribed, engage patient in 1:1 staff talks.   Continue to monitor as prescribed.  Patient able to contract for safety.

## 2017-06-17 NOTE — [facility] Group Notes (Signed)
[facility] [employer] Group Therapy 06/17/2017 1:15pm  Type of Therapy: Mental [employer] Presentation  DID NOT ATTEND.  Summary of Progress/Problems: [facility]) Speaker came to talk about his personal journey with mental health. The pt processed ways by which to relate to the speaker. MHA speaker provided handouts and educational information pertaining to groups and services offered by the [facility]. Pt was engaged in speaker's presentation and was receptive to resources provided.    Trula Slade, MSW, LCSW Clinical Social Worker 06/17/2017 1:28 PM

## 2017-06-17 NOTE — Progress Notes (Signed)
NUTRITION ASSESSMENT  Pt identified as at risk on the Malnutrition Screen Tool  INTERVENTION: 1. Supplements: Continue Ensure Enlive po BID, each supplement provides 350 kcal and 20 grams of protein  NUTRITION DIAGNOSIS: Unintentional weight loss related to sub-optimal intake as evidenced by pt report.   Goal: Pt to meet >/= 90% of their estimated nutrition needs.  Monitor:  PO intake  Assessment:  Pt admitted with depression and polysubstance abuse (ETOH, cocaine, heroin, meth). Pt has been drinking 4-5 beers daily. Pt reports decreased appetite.  Per chart review, pt has lost 9 lb since 2/7 (5% wt loss x 7 months, insignificant for time frame). Will continue current order of Ensure supplements.  Height: Ht Readings from Last 1 Encounters:  06/16/17 5\' 10"  (1.778 m)    Weight: Wt Readings from Last 1 Encounters:  06/16/17 189 lb (85.7 kg)    Weight Hx: Wt Readings from Last 10 Encounters:  06/16/17 189 lb (85.7 kg)  06/15/17 203 lb (92.1 kg)  11/28/16 198 lb (89.8 kg)  05/24/15 236 lb (107 kg)  03/10/15 238 lb (108 kg)  11/23/14 250 lb 11.2 oz (113.7 kg)  11/09/14 254 lb (115.2 kg)  10/25/14 251 lb (113.9 kg)  10/21/14 220 lb (99.8 kg)  10/13/14 220 lb (99.8 kg)    BMI:  Body mass index is 27.12 kg/m. Pt meets criteria for overweight based on current BMI.  Estimated Nutritional Needs: Kcal: 25-30 kcal/kg Protein: > 1 gram protein/kg Fluid: 1 ml/kcal  Diet Order: Diet Heart Room service appropriate? Yes; Fluid consistency: Thin Pt is also offered choice of unit snacks mid-morning and mid-afternoon.  Pt is eating as desired.   Lab results and medications reviewed.   Tilda Franco, MS, RD, LDN Pager: [phone] After Hours Pager: [phone]

## 2017-06-18 DIAGNOSIS — F332 Major depressive disorder, recurrent severe without psychotic features: Principal | ICD-10-CM

## 2017-06-18 DIAGNOSIS — F39 Unspecified mood [affective] disorder: Secondary | ICD-10-CM

## 2017-06-18 DIAGNOSIS — F1110 Opioid abuse, uncomplicated: Secondary | ICD-10-CM

## 2017-06-18 DIAGNOSIS — K7460 Unspecified cirrhosis of liver: Secondary | ICD-10-CM

## 2017-06-18 DIAGNOSIS — Z590 Homelessness: Secondary | ICD-10-CM

## 2017-06-18 DIAGNOSIS — F1994 Other psychoactive substance use, unspecified with psychoactive substance-induced mood disorder: Secondary | ICD-10-CM

## 2017-06-18 DIAGNOSIS — B1920 Unspecified viral hepatitis C without hepatic coma: Secondary | ICD-10-CM

## 2017-06-18 DIAGNOSIS — F4310 Post-traumatic stress disorder, unspecified: Secondary | ICD-10-CM

## 2017-06-18 DIAGNOSIS — F1020 Alcohol dependence, uncomplicated: Secondary | ICD-10-CM

## 2017-06-18 DIAGNOSIS — R440 Auditory hallucinations: Secondary | ICD-10-CM

## 2017-06-18 DIAGNOSIS — F1210 Cannabis abuse, uncomplicated: Secondary | ICD-10-CM

## 2017-06-18 LAB — CBC WITH DIFFERENTIAL/PLATELET
Basophils Absolute: 0 10*3/uL (ref 0.0–0.1)
Basophils Relative: 0 %
EOS PCT: 3 %
Eosinophils Absolute: 0.2 10*3/uL (ref 0.0–0.7)
HCT: 40.8 % (ref 39.0–52.0)
HEMOGLOBIN: 14.1 g/dL (ref 13.0–17.0)
LYMPHS ABS: 2.6 10*3/uL (ref 0.7–4.0)
LYMPHS PCT: 35 %
MCH: 31.6 pg (ref 26.0–34.0)
MCHC: 34.6 g/dL (ref 30.0–36.0)
MCV: 91.5 fL (ref 78.0–100.0)
MONOS PCT: 9 %
Monocytes Absolute: 0.7 10*3/uL (ref 0.1–1.0)
Neutro Abs: 4 10*3/uL (ref 1.7–7.7)
Neutrophils Relative %: 53 %
PLATELETS: 247 10*3/uL (ref 150–400)
RBC: 4.46 MIL/uL (ref 4.22–5.81)
RDW: 13.6 % (ref 11.5–15.5)
WBC: 7.6 10*3/uL (ref 4.0–10.5)

## 2017-06-18 LAB — LIPID PANEL
CHOL/HDL RATIO: 4 ratio
CHOLESTEROL: 165 mg/dL (ref 0–200)
HDL: 41 mg/dL (ref >40)
LDL CALC: 76 mg/dL (ref 0–99)
Triglycerides: 241 mg/dL — ABNORMAL HIGH (ref <150)
VLDL: 48 mg/dL — AB (ref 0–40)

## 2017-06-18 LAB — TSH: TSH: 1.02 u[IU]/mL (ref 0.350–4.500)

## 2017-06-18 LAB — HEMOGLOBIN A1C
Hgb A1c MFr Bld: 5.7 % — ABNORMAL HIGH (ref 4.8–5.6)
Mean Plasma Glucose: 116.89 mg/dL

## 2017-06-18 MED ORDER — PRAZOSIN HCL 2 MG PO CAPS
2.0000 mg | ORAL_CAPSULE | Freq: Every day | ORAL | Status: DC
  Administered 2017-06-18: 2 mg via ORAL
  Filled 2017-06-18 (×3): qty 1
  Filled 2017-06-18: qty 2

## 2017-06-18 MED ORDER — QUETIAPINE FUMARATE 50 MG PO TABS
150.0000 mg | ORAL_TABLET | Freq: Every day | ORAL | Status: DC
  Administered 2017-06-18: 150 mg via ORAL
  Filled 2017-06-18 (×4): qty 3

## 2017-06-18 MED ORDER — GABAPENTIN 300 MG PO CAPS
600.0000 mg | ORAL_CAPSULE | Freq: Three times a day (TID) | ORAL | Status: DC
  Administered 2017-06-18 – 2017-06-20 (×6): 600 mg via ORAL
  Filled 2017-06-18 (×12): qty 2

## 2017-06-18 NOTE — Plan of Care (Signed)
Problem: Safety: Goal: Periods of time without injury will increase Outcome: Progressing Pt has not harmed self or others tonight.  He endorses SI.  Pt verbally contracts for safety.

## 2017-06-18 NOTE — Progress Notes (Signed)
Nursing Progress Note: 7p-7a D: Pt currently presents with a anxious/agitated/angry/verbally threatening affect and behavior. Pt states "I am so fucking pissed. I need my Librium. Why are yall motherfucker tapering it off. How can I live? Francesca Oman just expect me to be taking a med all the time and now not as much? This makes me want to die. I want to talk to the boss. I need them to put me on Librium permanently." Interacting appropriately with the milieu. Pt reports good sleep during the previous night with current medication regimen. Pt did attend wrap-up group.  A: Pt provided with medications per providers orders. Pt's labs and vitals were monitored throughout the night. Pt supported emotionally and encouraged to express concerns and questions. Pt educated on medications.  R: Pt's safety ensured with 15 minute and environmental checks. Pt currently denies SI, HI, and AVH. Pt verbally contracts to seek staff if SI,HI, or AVH occurs and to consult with staff before acting on any harmful thoughts. Will continue to monitor.

## 2017-06-18 NOTE — Progress Notes (Signed)
Pt attended AA meeting this evening.  

## 2017-06-18 NOTE — [facility] Group Notes (Signed)
LCSW Group Therapy Note   06/18/2017 1:15pm   Type of Therapy and Topic:  Group Therapy:  Overcoming Obstacles   Participation Level:  Active   Description of Group:    In this group patients will be encouraged to explore what they see as obstacles to their own wellness and recovery. They will be guided to discuss their thoughts, feelings, and behaviors related to these obstacles. The group will process together ways to cope with barriers, with attention given to specific choices patients can make. Each patient will be challenged to identify changes they are motivated to make in order to overcome their obstacles. This group will be process-oriented, with patients participating in exploration of their own experiences as well as giving and receiving support and challenge from other group members.   Therapeutic Goals: 1. Patient will identify personal and current obstacles as they relate to admission. 2. Patient will identify barriers that currently interfere with their wellness or overcoming obstacles.  3. Patient will identify feelings, thought process and behaviors related to these barriers. 4. Patient will identify two changes they are willing to make to overcome these obstacles:      Summary of Patient Progress [name] [name] was attentive and engaged during today's processing group. He shared that his biggest obstacle is remaining sober. [name] [name] stated that he was accepted into [facility] later this week and plans to join [facility] for [employer]. [name] [name] continues to endorse withdrawals and difficulties with "getting the detox medications that I need." He is showing improving insight and improvement in the group setting.      Therapeutic Modalities:   Cognitive Behavioral Therapy Solution Focused Therapy Motivational Interviewing Relapse Prevention Therapy  [name] [name] Smart, LCSW 06/18/2017 12:56 PM

## 2017-06-18 NOTE — Progress Notes (Addendum)
D: Pt was in bed in his room upon initial approach.  Pt presents with anxious, depressed affect and mood.  His goal is to "get stabilized on my mental health, get detoxed."  Pt denies HI, denies hallucinations, denies pain.  Complains of withdrawal symptoms of "shakes, nausea, anxiety."  Endorses SI.  Reports he does not have a plan while at [facility] but "outside I would."  Pt has been visible in milieu with few peer interactions.    A: Introduced self to pt.  Actively listened to pt and offered support and encouragement. Medications administered per order.  PRN medication administered for anxiety and nausea.  Q15 minute safety checks maintained.  R: Pt is safe on the unit.  Pt is compliant with medications.  Pt verbally contracts for safety.  Will continue to monitor and assess.

## 2017-06-18 NOTE — Progress Notes (Signed)
[facility] MD Progress Note  06/18/2017 3:48 PM [name] [name]  MRN:  [id number] Subjective:    41 y.o Caucasian male, single,currently homeless. Background history of SUD, Mood Disorder. Self presented to the hospital requesting detox. Relapsed four months ago. Has been drinking regularly, has been using opiates mainly. Reports auditory hallucinations telling him to jump off the bridge. UDS is positive for opiates, THC and amphetamine. BAL was <5  Chart reviewed today. Patient discussed at team today.  Staff reports that he continue to express suicidal thoughts. He has been out on the milieu. He has not been observed to be internally stimulated. Limited group engagement.   Seen today. Says he is still hearing voices. They are lessening. No visual hallucinations. No hallucinations in any other modality.  No violent thoughts towards others. No feeling as if people are interfering with him. No sweatiness, no headaches. No retching, nausea or vomiting. No fullness in the head. Patient is very focused on getting more medication. Says he is on higher dose of Gabapentin. He wants to be on Bupropion and Sertraline. Says he has been on Minipress for PTSD  Principal Problem: Major depressive disorder, recurrent severe without psychotic features (HCC) Diagnosis:  Substance Induced Mood Disorder Patient Active Problem List   Diagnosis Date Noted  . Alcohol use disorder, severe, dependence (HCC) [F10.20]   . Major depressive disorder, recurrent severe without psychotic features (HCC) [F33.2] 12/03/2016  . ETOH abuse [F10.10]   . Cirrhosis (HCC) [K74.60] 05/24/2015  . Suicidal ideation [R45.851]   . Recurrent cellulitis of lower leg [L03.119] 11/09/2014  . Venous (peripheral) insufficiency [I87.2] 11/09/2014  . Bipolar affective disorder (HCC) [F31.9]   . Alcohol dependence with alcohol-induced mood disorder (HCC) [F10.24]   . Opioid dependence with opioid-induced mood disorder (HCC) [F11.24]   . Severe  recurrent major depression without psychotic features (HCC) [F33.2] 09/09/2014  . MDD (major depressive disorder) [F32.9] 09/09/2014  . Suicidal ideations [R45.851]   . Chronic pain syndrome [G89.4] 05/31/2014  . Tobacco dependence [F17.200] 05/30/2014  . Poor dentition [K08.9] 02/03/2014  . Hepatitis C virus infection without hepatic coma [B19.20] 02/03/2014  . Sepsis (HCC) [A41.9] 01/29/2014  . Anxiety state, unspecified [F41.1] 01/31/2013  . Mood disorder in conditions classified elsewhere [F06.30] 01/31/2013  . 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 Alcohol withdrawal (HCC) [F10.239] 01/27/2013    Class: Acute   Total Time spent with patient: 20 minutes  Past Psychiatric History: As in H&P  Past Medical History:  Past Medical History:  Diagnosis Date  . Bipolar disorder (HCC)   . Cellulitis   . Depression   . Hepatitis C     Past Surgical History:  Procedure Laterality Date  . BACK SURGERY    . ROTATOR CUFF REPAIR    . SHOULDER SURGERY     Family History:  Family History  Problem Relation Age of Onset  . Alcohol abuse Father   . Cancer Maternal Aunt    Family Psychiatric  History: As in H&P Social History:  History  Alcohol Use  . 7.2 oz/week  . 12 Cans of beer per week    Comment: heavy     History  Drug Use No    Comment: denies at this time    Social History   Social History  . Marital status: Divorced    Spouse name: N/A  . Number of children: N/A  . Years of education: N/A  Social History Main Topics  . Smoking status: Current Every Day Smoker    Packs/day: 1.00    Types: Cigarettes    Start date: 10/22/1981  . Smokeless tobacco: Never Used  . Alcohol use 7.2 oz/week    12 Cans of beer per week     Comment: heavy  . Drug use: No     Comment: denies at this time  . Sexual activity: Not Currently   Other Topics Concern  . None   Social History Narrative  .  None   Additional Social History:        Sleep: Fair  Appetite:  Fair  Current Medications: Current Facility-Administered Medications  Medication Dose Route Frequency Provider Last Rate Last Dose  . acetaminophen (TYLENOL) tablet 650 mg  650 mg Oral Q6H PRN Charm Rings, NP      . alum & mag hydroxide-simeth (MAALOX/MYLANTA) 200-200-20 MG/5ML suspension 30 mL  30 mL Oral Q4H PRN Charm Rings, NP      . chlordiazePOXIDE (LIBRIUM) capsule 25 mg  25 mg Oral Q6H PRN Nira Conn A, NP   25 mg at 06/17/17 0813  . chlordiazePOXIDE (LIBRIUM) capsule 25 mg  25 mg Oral TID Cobos, Rockey Situ, MD   25 mg at 06/18/17 1100   Followed by  . [START ON 06/19/2017] chlordiazePOXIDE (LIBRIUM) capsule 25 mg  25 mg Oral BH-qamhs Cobos, Rockey Situ, MD       Followed by  . [START ON 06/20/2017] chlordiazePOXIDE (LIBRIUM) capsule 25 mg  25 mg Oral Daily Cobos, Fernando A, MD      . cloNIDine (CATAPRES) tablet 0.1 mg  0.1 mg Oral BH-qamhs Lord, Herminio Heads, NP       Followed by  . [START ON 06/21/2017] cloNIDine (CATAPRES) tablet 0.1 mg  0.1 mg Oral QAC breakfast Charm Rings, NP      . dicyclomine (BENTYL) tablet 20 mg  20 mg Oral Q6H PRN Charm Rings, NP   20 mg at 06/17/17 0246  . feeding supplement (ENSURE ENLIVE) (ENSURE ENLIVE) liquid 237 mL  237 mL Oral BID BM Cobos, Rockey Situ, MD   237 mL at 06/18/17 1057  . gabapentin (NEURONTIN) capsule 300 mg  300 mg Oral TID Charm Rings, NP   300 mg at 06/18/17 1100  . hydrOXYzine (ATARAX/VISTARIL) tablet 25 mg  25 mg Oral Q6H PRN Charm Rings, NP   25 mg at 06/18/17 1528  . loperamide (IMODIUM) capsule 2-4 mg  2-4 mg Oral PRN Charm Rings, NP      . magnesium hydroxide (MILK OF MAGNESIA) suspension 30 mL  30 mL Oral Daily PRN Charm Rings, NP      . methocarbamol (ROBAXIN) tablet 500 mg  500 mg Oral Q8H PRN Charm Rings, NP   500 mg at 06/18/17 1056  . multivitamin with minerals tablet 1 tablet  1 tablet Oral Daily Nira Conn A, NP   1  tablet at 06/18/17 0739  . naproxen (NAPROSYN) tablet 500 mg  500 mg Oral BID PRN Charm Rings, NP   500 mg at 06/18/17 1056  . nicotine (NICODERM CQ - dosed in mg/24 hours) patch 21 mg  21 mg Transdermal Daily Cobos, Rockey Situ, MD   21 mg at 06/18/17 0740  . ondansetron (ZOFRAN-ODT) disintegrating tablet 4 mg  4 mg Oral Q6H PRN Charm Rings, NP   4 mg at 06/17/17 2107  . QUEtiapine (SEROQUEL) tablet 50 mg  50 mg Oral BID Armandina Stammer I, NP   50 mg at 06/18/17 0740  . sertraline (ZOLOFT) tablet 50 mg  50 mg Oral Daily Cobos, Rockey Situ, MD   50 mg at 06/18/17 0739  . thiamine (B-1) injection 100 mg  100 mg Intramuscular Once Cobos, Fernando A, MD      . thiamine (VITAMIN B-1) tablet 100 mg  100 mg Oral Daily Nira Conn A, NP   100 mg at 06/18/17 1610    Lab Results:  Results for orders placed or performed during the hospital encounter of 06/16/17 (from the past 48 hour(s))  Lipid panel     Status: Abnormal   Collection Time: 06/18/17  6:30 AM  Result Value Ref Range   Cholesterol 165 0 - 200 mg/dL   Triglycerides 960 (H) <150 mg/dL   HDL 41 >45 mg/dL   Total CHOL/HDL Ratio 4.0 RATIO   VLDL 48 (H) 0 - 40 mg/dL   LDL Cholesterol 76 0 - 99 mg/dL    Comment:        Total Cholesterol/HDL:CHD Risk Coronary Heart Disease Risk Table                     Men   Women  1/2 Average Risk   3.4   3.3  Average Risk       5.0   4.4  2 X Average Risk   9.6   7.1  3 X Average Risk  23.4   11.0        Use the calculated Patient Ratio above and the CHD Risk Table to determine the patient's CHD Risk.        ATP III CLASSIFICATION (LDL):  <100     mg/dL   Optimal  409-811  mg/dL   Near or Above                    Optimal  130-159  mg/dL   Borderline  914-782  mg/dL   High  >956     mg/dL   Very High Performed at [facility], [address], [place], [place] [address]   Hemoglobin A1c     Status: Abnormal   Collection Time: 06/18/17  6:30 AM  Result Value Ref Range   Hgb A1c MFr  Bld 5.7 (H) 4.8 - 5.6 %    Comment: (NOTE) Pre diabetes:          5.7%-6.4% Diabetes:              >6.4% Glycemic control for   <7.0% adults with diabetes    Mean Plasma Glucose 116.89 mg/dL    Comment: Performed at [facility], [address], [place], [place] [address]  CBC with Differential/Platelet     Status: None   Collection Time: 06/18/17  6:30 AM  Result Value Ref Range   WBC 7.6 4.0 - 10.5 K/uL   RBC 4.46 4.22 - 5.81 MIL/uL   Hemoglobin 14.1 13.0 - 17.0 g/dL   HCT 69.6 29.5 - 28.4 %   MCV 91.5 78.0 - 100.0 fL   MCH 31.6 26.0 - 34.0 pg   MCHC 34.6 30.0 - 36.0 g/dL   RDW 13.2 44.0 - 10.2 %   Platelets 247 150 - 400 K/uL   Neutrophils Relative % 53 %   Neutro Abs 4.0 1.7 - 7.7 K/uL   Lymphocytes Relative 35 %   Lymphs Abs 2.6 0.7 - 4.0 K/uL  Monocytes Relative 9 %   Monocytes Absolute 0.7 0.1 - 1.0 K/uL   Eosinophils Relative 3 %   Eosinophils Absolute 0.2 0.0 - 0.7 K/uL   Basophils Relative 0 %   Basophils Absolute 0.0 0.0 - 0.1 K/uL    Comment: Performed at [facility], [address] [place], [place] [address]  TSH     Status: None   Collection Time: 06/18/17  6:30 AM  Result Value Ref Range   TSH 1.020 0.350 - 4.500 uIU/mL    Comment: Performed by a 3rd Generation assay with a functional sensitivity of <=0.01 uIU/mL. Performed at [facility], [address], [place], [place] [address]     Blood Alcohol level:  Lab Results  Component Value Date   ETH <5 06/15/2017   ETH 60 (H) 11/27/2016    Metabolic Disorder Labs: Lab Results  Component Value Date   HGBA1C 5.7 (H) 06/18/2017   MPG 116.89 06/18/2017   MPG 120 12/03/2016   No results found for: PROLACTIN Lab Results  Component Value Date   CHOL 165 06/18/2017   TRIG 241 (H) 06/18/2017   HDL 41 06/18/2017   CHOLHDL 4.0 06/18/2017   VLDL 48 (H) 06/18/2017   LDLCALC 76 06/18/2017    Physical Findings: AIMS: Facial and Oral Movements Muscles of  Facial Expression: None, normal Lips and Perioral Area: None, normal Jaw: None, normal Tongue: None, normal,Extremity Movements Upper (arms, wrists, hands, fingers): None, normal Lower (legs, knees, ankles, toes): None, normal, Trunk Movements Neck, shoulders, hips: None, normal, Overall Severity Severity of abnormal movements (highest score from questions above): None, normal Incapacitation due to abnormal movements: None, normal Patient's awareness of abnormal movements (rate only patient's report): No Awareness, Dental Status Current problems with teeth and/or dentures?: No Does patient usually wear dentures?: No  CIWA:  CIWA-Ar Total: 4 COWS:  COWS Total Score: 4  Musculoskeletal: Strength & Muscle Tone: within normal limits Gait & Station: normal Patient leans: N/A  Psychiatric Specialty Exam: Physical Exam  ROS  Blood pressure 122/74, pulse 80, temperature 97.6 F (36.4 C), temperature source Oral, resp. rate 18, height 5\' 10"  (1.778 m), weight 85.7 kg (189 lb), SpO2 97 %.Body mass index is 27.12 kg/m.  General Appearance: Casually dressed, underlying irritability. Does not appear internally distracted.   Eye Contact:  Good  Speech:  Clear and Coherent and Normal Rate  Volume:  Normal  Mood:  Dysphoric and Irritable  Affect:  Restricted  Thought Process:  Linear  Orientation:  Full (Time, Place, and Person)  Thought Content:  Focused on his hallucinations and need for more medications.   Suicidal Thoughts:  Off and on  Homicidal Thoughts:  No  Memory:  Immediate;   Fair Recent;   Fair Remote;   Fair  Judgement:  Poor  Insight:  Shallow  Psychomotor Activity:  Normal  Concentration:  Concentration: Fair and Attention Span: Fair  Recall:  [employer] of Knowledge:  Good  Language:  Good  Akathisia:  Negative  Handed:    AIMS (if indicated):     Assets:  Desire for Improvement Resilience  ADL's:  Intact  Cognition:  WNL  Sleep:  Number of Hours: 4      Assessment and Plan   Patient is coming off multiple psychoactive substances. He is still irritable and dysphoric. Auditory hallucinations are lessening. He continues to report fleeting suicidal thoughts. We have agreed to adjust his medications as below. Needs further evaluation.  Psychiatric: SUD SIPD PTSD MDD   Medical: HCV Cirrhosis  Psychosocial:  Homelessness Limited support  PLAN: 1. Increase Seroquel to 50 mg daily and 150 mg at bedtime 2. Increase Gabapentin to 600 mg TID 3. Encourage unit groups and activities 4. Monitor mood, behavior and interaction with peers 4. Motivational enhancement.  5. SW would facilitate aftercare   Georgiann Cocker, MD 06/18/2017, 3:48 PM

## 2017-06-18 NOTE — Progress Notes (Signed)
Patient denies VH and HI but reports SI and AH.  Patient has been complaining of withdrawal symptoms which have been managed by the withdrawal protocol.   Assess patient for safety, offer medications as prescribed, engage patient in 1:1 staff talks.   Continue to monitor as prescribed.  Patient able to contract for safety.  

## 2017-06-18 NOTE — Social Work (Signed)
Referred to [facility], is [place] resident.  Pailyn Bellevue, LCSW Lead Clinical Social Worker Phone:  [phone]  

## 2017-06-19 MED ORDER — FLUOXETINE HCL 20 MG PO CAPS
30.0000 mg | ORAL_CAPSULE | Freq: Every day | ORAL | Status: DC
  Administered 2017-06-20: 30 mg via ORAL
  Filled 2017-06-19 (×3): qty 1

## 2017-06-19 MED ORDER — QUETIAPINE FUMARATE 200 MG PO TABS
200.0000 mg | ORAL_TABLET | Freq: Every day | ORAL | Status: DC
  Administered 2017-06-20 (×2): 200 mg via ORAL
  Filled 2017-06-19 (×4): qty 1

## 2017-06-19 MED ORDER — PRAZOSIN HCL 2 MG PO CAPS
4.0000 mg | ORAL_CAPSULE | Freq: Every day | ORAL | Status: DC
  Administered 2017-06-20 – 2017-06-21 (×3): 4 mg via ORAL
  Filled 2017-06-19 (×3): qty 2
  Filled 2017-06-19: qty 4
  Filled 2017-06-19 (×2): qty 2

## 2017-06-19 NOTE — Progress Notes (Signed)
Recreation Therapy Notes  Date:  06/19/17 Time: 0930 Location: [address] Dayroom  Group Topic: Stress Management  Goal Area(s) Addresses:  Patient will verbalize importance of using healthy stress management.  Patient will identify positive emotions associated with healthy stress management.   Behavioral Response: Engaged  Intervention: Stress Management  Activity :  Guided Imagery.  LRT introduced the stress management technique of guided imagery.  LRT read a script so patients could follow along and imagine being in their peaceful place.  Patients were to follow along as LRT read the script to engage in the activity.  Education:  Stress Management, Discharge Planning.   Education Outcome: Acknowledges edcuation/In group clarification offered/Needs additional education  Clinical Observations/Feedback:  Pt attended group.   [name] [name], LRT/CTRS         [name] [name] A 06/19/2017 12:10 PM 

## 2017-06-19 NOTE — Progress Notes (Signed)
[facility] MD Progress Note  06/19/2017 3:12 PM [name] [name]  MRN:  [id number] Subjective:    41 y.o Caucasian male, single,currently homeless. Background history of SUD, Mood Disorder. Self presented to the hospital requesting detox. Relapsed four months ago. Has been drinking regularly, has been using opiates mainly. Reports auditory hallucinations telling him to jump off the bridge. UDS is positive for opiates, THC and amphetamine. BAL was <5  Chart reviewed today. Patient discussed at team today.  Staff reports that he was irritable last night. He felt he needed more Librium. Vitals and CIWA are stabilizing.  He expressed suicidal thoughts last night. He attended some of the unit groups.  During team today, patient protested early discharge. Says he is feeling good yet.   Seen today. No longer having auditory and visual hallucinations. Feels irritable. Has sensations as if there are things crawling under his skin. Says it makes him feel worthless at times. Suicidal thoughts are still there but not as strong as they were earlier. Says he is not back to normalcy. Says he used to be on more medications than he is on now. Would prefer Prozac to Sertraline as it is more energizing for him. Says he plans to get into sober living once he is feeling well again. Says he has all that set up already.   Principal Problem: Major depressive disorder, recurrent severe without psychotic features (HCC) Diagnosis:  Substance Induced Mood Disorder Patient Active Problem List   Diagnosis Date Noted  . Alcohol use disorder, severe, dependence (HCC) [F10.20]   . Major depressive disorder, recurrent severe without psychotic features (HCC) [F33.2] 12/03/2016  . ETOH abuse [F10.10]   . Cirrhosis (HCC) [K74.60] 05/24/2015  . Suicidal ideation [R45.851]   . Recurrent cellulitis of lower leg [L03.119] 11/09/2014  . Venous (peripheral) insufficiency [I87.2] 11/09/2014  . Bipolar affective disorder (HCC) [F31.9]   .  Alcohol dependence with alcohol-induced mood disorder (HCC) [F10.24]   . Opioid dependence with opioid-induced mood disorder (HCC) [F11.24]   . Severe recurrent major depression without psychotic features (HCC) [F33.2] 09/09/2014  . MDD (major depressive disorder) [F32.9] 09/09/2014  . Suicidal ideations [R45.851]   . Chronic pain syndrome [G89.4] 05/31/2014  . Tobacco dependence [F17.200] 05/30/2014  . Poor dentition [K08.9] 02/03/2014  . Hepatitis C virus infection without hepatic coma [B19.20] 02/03/2014  . Sepsis (HCC) [A41.9] 01/29/2014  . Anxiety state, unspecified [F41.1] 01/31/2013  . Mood disorder in conditions classified elsewhere [F06.30] 01/31/2013  . 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 Alcohol withdrawal (HCC) [F10.239] 01/27/2013    Class: Acute   Total Time spent with patient: 20 minutes  Past Psychiatric History: As in H&P  Past Medical History:  Past Medical History:  Diagnosis Date  . Bipolar disorder (HCC)   . Cellulitis   . Depression   . Hepatitis C     Past Surgical History:  Procedure Laterality Date  . BACK SURGERY    . ROTATOR CUFF REPAIR    . SHOULDER SURGERY     Family History:  Family History  Problem Relation Age of Onset  . Alcohol abuse Father   . Cancer Maternal Aunt    Family Psychiatric  History: As in H&P Social History:  History  Alcohol Use  . 7.2 oz/week  . 12 Cans of beer per week    Comment: heavy     History  Drug Use No    Comment: denies at  this time    Social History   Social History  . Marital status: Divorced    Spouse name: N/A  . Number of children: N/A  . Years of education: N/A   Social History Main Topics  . Smoking status: Current Every Day Smoker    Packs/day: 1.00    Types: Cigarettes    Start date: 10/22/1981  . Smokeless tobacco: Never Used  . Alcohol use 7.2 oz/week    12 Cans of beer per week     Comment: heavy   . Drug use: No     Comment: denies at this time  . Sexual activity: Not Currently   Other Topics Concern  . None   Social History Narrative  . None   Additional Social History:        Sleep: Better  Appetite:  Better  Current Medications: Current Facility-Administered Medications  Medication Dose Route Frequency Provider Last Rate Last Dose  . acetaminophen (TYLENOL) tablet 650 mg  650 mg Oral Q6H PRN Charm Rings, NP      . alum & mag hydroxide-simeth (MAALOX/MYLANTA) 200-200-20 MG/5ML suspension 30 mL  30 mL Oral Q4H PRN Charm Rings, NP      . chlordiazePOXIDE (LIBRIUM) capsule 25 mg  25 mg Oral Q6H PRN Nira Conn A, NP   25 mg at 06/19/17 1331  . chlordiazePOXIDE (LIBRIUM) capsule 25 mg  25 mg Oral BH-qamhs Cobos, Rockey Situ, MD   25 mg at 06/19/17 0843   Followed by  . [START ON 06/20/2017] chlordiazePOXIDE (LIBRIUM) capsule 25 mg  25 mg Oral Daily Cobos, Fernando A, MD      . cloNIDine (CATAPRES) tablet 0.1 mg  0.1 mg Oral BH-qamhs Lord, Jamison Y, NP   0.1 mg at 06/19/17 0800   Followed by  . [START ON 06/21/2017] cloNIDine (CATAPRES) tablet 0.1 mg  0.1 mg Oral QAC breakfast Charm Rings, NP      . dicyclomine (BENTYL) tablet 20 mg  20 mg Oral Q6H PRN Charm Rings, NP   20 mg at 06/18/17 2259  . feeding supplement (ENSURE ENLIVE) (ENSURE ENLIVE) liquid 237 mL  237 mL Oral BID BM Cobos, Rockey Situ, MD   237 mL at 06/19/17 1500  . gabapentin (NEURONTIN) capsule 600 mg  600 mg Oral TID Georgiann Cocker, MD   600 mg at 06/19/17 1143  . hydrOXYzine (ATARAX/VISTARIL) tablet 25 mg  25 mg Oral Q6H PRN Charm Rings, NP   25 mg at 06/19/17 1032  . loperamide (IMODIUM) capsule 2-4 mg  2-4 mg Oral PRN Charm Rings, NP      . magnesium hydroxide (MILK OF MAGNESIA) suspension 30 mL  30 mL Oral Daily PRN Charm Rings, NP      . methocarbamol (ROBAXIN) tablet 500 mg  500 mg Oral Q8H PRN Charm Rings, NP   500 mg at 06/19/17 1610  . multivitamin with minerals  tablet 1 tablet  1 tablet Oral Daily Nira Conn A, NP   1 tablet at 06/19/17 0845  . naproxen (NAPROSYN) tablet 500 mg  500 mg Oral BID PRN Charm Rings, NP   500 mg at 06/19/17 9604  . nicotine (NICODERM CQ - dosed in mg/24 hours) patch 21 mg  21 mg Transdermal Daily Cobos, Rockey Situ, MD   21 mg at 06/19/17 0800  . ondansetron (ZOFRAN-ODT) disintegrating tablet 4 mg  4 mg Oral Q6H PRN Charm Rings, NP   4  mg at 06/18/17 2259  . prazosin (MINIPRESS) capsule 2 mg  2 mg Oral QHS Izediuno, Delight Ovens, MD   2 mg at 06/18/17 2154  . QUEtiapine (SEROQUEL) tablet 150 mg  150 mg Oral QHS Izediuno, Delight Ovens, MD   150 mg at 06/18/17 2154  . QUEtiapine (SEROQUEL) tablet 50 mg  50 mg Oral BID Armandina Stammer I, NP   50 mg at 06/19/17 0844  . sertraline (ZOLOFT) tablet 50 mg  50 mg Oral Daily Izediuno, Delight Ovens, MD   50 mg at 06/19/17 0845  . thiamine (B-1) injection 100 mg  100 mg Intramuscular Once Cobos, Fernando A, MD      . thiamine (VITAMIN B-1) tablet 100 mg  100 mg Oral Daily Nira Conn A, NP   100 mg at 06/19/17 1610    Lab Results:  Results for orders placed or performed during the hospital encounter of 06/16/17 (from the past 48 hour(s))  Lipid panel     Status: Abnormal   Collection Time: 06/18/17  6:30 AM  Result Value Ref Range   Cholesterol 165 0 - 200 mg/dL   Triglycerides 960 (H) <150 mg/dL   HDL 41 >45 mg/dL   Total CHOL/HDL Ratio 4.0 RATIO   VLDL 48 (H) 0 - 40 mg/dL   LDL Cholesterol 76 0 - 99 mg/dL    Comment:        Total Cholesterol/HDL:CHD Risk Coronary Heart Disease Risk Table                     Men   Women  1/2 Average Risk   3.4   3.3  Average Risk       5.0   4.4  2 X Average Risk   9.6   7.1  3 X Average Risk  23.4   11.0        Use the calculated Patient Ratio above and the CHD Risk Table to determine the patient's CHD Risk.        ATP III CLASSIFICATION (LDL):  <100     mg/dL   Optimal  409-811  mg/dL   Near or Above                    Optimal   130-159  mg/dL   Borderline  914-782  mg/dL   High  >956     mg/dL   Very High Performed at [facility], [address], [place], [place] [address]   Hemoglobin A1c     Status: Abnormal   Collection Time: 06/18/17  6:30 AM  Result Value Ref Range   Hgb A1c MFr Bld 5.7 (H) 4.8 - 5.6 %    Comment: (NOTE) Pre diabetes:          5.7%-6.4% Diabetes:              >6.4% Glycemic control for   <7.0% adults with diabetes    Mean Plasma Glucose 116.89 mg/dL    Comment: Performed at [facility], [address], [place], [place] [address]  CBC with Differential/Platelet     Status: None   Collection Time: 06/18/17  6:30 AM  Result Value Ref Range   WBC 7.6 4.0 - 10.5 K/uL   RBC 4.46 4.22 - 5.81 MIL/uL   Hemoglobin 14.1 13.0 - 17.0 g/dL   HCT 69.6 29.5 - 28.4 %   MCV 91.5 78.0 - 100.0 fL   MCH 31.6 26.0 -  34.0 pg   MCHC 34.6 30.0 - 36.0 g/dL   RDW 25.8 52.7 - 78.2 %   Platelets 247 150 - 400 K/uL   Neutrophils Relative % 53 %   Neutro Abs 4.0 1.7 - 7.7 K/uL   Lymphocytes Relative 35 %   Lymphs Abs 2.6 0.7 - 4.0 K/uL   Monocytes Relative 9 %   Monocytes Absolute 0.7 0.1 - 1.0 K/uL   Eosinophils Relative 3 %   Eosinophils Absolute 0.2 0.0 - 0.7 K/uL   Basophils Relative 0 %   Basophils Absolute 0.0 0.0 - 0.1 K/uL    Comment: Performed at [facility] [address] [place], [place] [address]  TSH     Status: None   Collection Time: 06/18/17  6:30 AM  Result Value Ref Range   TSH 1.020 0.350 - 4.500 uIU/mL    Comment: Performed by a 3rd Generation assay with a functional sensitivity of <=0.01 uIU/mL. Performed at [facility], [address] [place], [place] [address]     Blood Alcohol level:  Lab Results  Component Value Date   ETH <5 06/15/2017   ETH 60 (H) 11/27/2016    Metabolic Disorder Labs: Lab Results  Component Value Date   HGBA1C 5.7 (H) 06/18/2017   MPG 116.89 06/18/2017   MPG 120 12/03/2016   No results  found for: PROLACTIN Lab Results  Component Value Date   CHOL 165 06/18/2017   TRIG 241 (H) 06/18/2017   HDL 41 06/18/2017   CHOLHDL 4.0 06/18/2017   VLDL 48 (H) 06/18/2017   LDLCALC 76 06/18/2017    Physical Findings: AIMS: Facial and Oral Movements Muscles of Facial Expression: None, normal Lips and Perioral Area: None, normal Jaw: None, normal Tongue: None, normal,Extremity Movements Upper (arms, wrists, hands, fingers): None, normal Lower (legs, knees, ankles, toes): None, normal, Trunk Movements Neck, shoulders, hips: None, normal, Overall Severity Severity of abnormal movements (highest score from questions above): None, normal Incapacitation due to abnormal movements: None, normal Patient's awareness of abnormal movements (rate only patient's report): No Awareness, Dental Status Current problems with teeth and/or dentures?: No Does patient usually wear dentures?: No  CIWA:  CIWA-Ar Total: 10 COWS:  COWS Total Score: 8  Musculoskeletal: Strength & Muscle Tone: within normal limits Gait & Station: normal Patient leans: N/A  Psychiatric Specialty Exam: Physical Exam  Constitutional: He appears well-developed and well-nourished.  HENT:  Head: Normocephalic and atraumatic.  Respiratory: Effort normal.  Neurological: He is alert.  Skin: Skin is warm and dry.  Psychiatric:  As above    ROS  Blood pressure 130/82, pulse 79, temperature 98 F (36.7 C), temperature source Oral, resp. rate 17, height 5\' 10"  (1.778 m), weight 85.7 kg (189 lb), SpO2 97 %.Body mass index is 27.12 kg/m.  General Appearance: Casually dressed. Irritability is marginally less today. Not internally distracted. Moderate rapport  Eye Contact:  Good  Speech:  Normal rate and tone  Volume:  Normal  Mood:  Dysphoric and Irritable  Affect:  Restricted and mood congruent.  Thought Process:  Linear  Orientation:  Full (Time, Place, and Person)  Thought Content:  Focused on getting more medication.  No thoughts of violence. Tactile hallucinations. No delusional theme.   Suicidal Thoughts:  Still off and on  Homicidal Thoughts:  No  Memory:  Immediate;   Fair Recent;   Fair Remote;   Fair  Judgement:  Fair  Insight:  Limited  Psychomotor Activity:  Normal  Concentration:  Concentration: Fair and Attention Span: Fair  Recall:  [employer] of Knowledge:  Good  Language:  Good  Akathisia:  Negative  Handed:    AIMS (if indicated):     Assets:  Desire for Improvement Resilience  ADL's:  Intact  Cognition:  WNL  Sleep:  Number of Hours: 6.5     Assessment and Plan   Patient is still coming off multiple psychoactive substances. Irritability and dysphoria is marginally better. Hallucinations are gradually resolving. We agreed to adjust his medications further today. Hopeful discharge by weekend if he makes progress.   Psychiatric: SUD SIPD PTSD MDD   Medical: HCV Cirrhosis  Psychosocial:  Homelessness Limited support  PLAN: 1. Increase Seroquel to 50 mg daily and 200 mg at bedtime 2. Switch Sertraline to Fluoxetine 30 mg tomorrow.  3. Increase Prazosin to home dose as he is still complaining of nightmares.  4. Continue to monitor mood, behavior and interaction with peers 5. SW would facilitate aftercare   Georgiann Cocker, MD 06/19/2017, 3:12 PMPatient ID: [name] [name], male   DOB: 08-01-1976, 41 y.o.   MRN: [id number]

## 2017-06-19 NOTE — Tx Team (Signed)
Interdisciplinary Treatment and Diagnostic Plan Update  06/19/2017 Time of Session: 1610RU [name] [name] MRN: [id number]  Principal Diagnosis: Major depressive disorder, recurrent severe without psychotic features [facility])  Secondary Diagnoses: Principal Problem:   Major depressive disorder, recurrent severe without psychotic features (HCC) Active Problems:   Alcohol dependence with alcohol-induced mood disorder (HCC)   Current Medications:  Current Facility-Administered Medications  Medication Dose Route Frequency Provider Last Rate Last Dose  . acetaminophen (TYLENOL) tablet 650 mg  650 mg Oral Q6H PRN Charm Rings, NP      . alum & mag hydroxide-simeth (MAALOX/MYLANTA) 200-200-20 MG/5ML suspension 30 mL  30 mL Oral Q4H PRN Charm Rings, NP      . chlordiazePOXIDE (LIBRIUM) capsule 25 mg  25 mg Oral Q6H PRN Nira Conn A, NP   25 mg at 06/18/17 2154  . chlordiazePOXIDE (LIBRIUM) capsule 25 mg  25 mg Oral BH-qamhs Cobos, Rockey Situ, MD       Followed by  . [START ON 06/20/2017] chlordiazePOXIDE (LIBRIUM) capsule 25 mg  25 mg Oral Daily Cobos, Fernando A, MD      . cloNIDine (CATAPRES) tablet 0.1 mg  0.1 mg Oral BH-qamhs Charm Rings, NP   0.1 mg at 06/18/17 2156   Followed by  . [START ON 06/21/2017] cloNIDine (CATAPRES) tablet 0.1 mg  0.1 mg Oral QAC breakfast Charm Rings, NP      . dicyclomine (BENTYL) tablet 20 mg  20 mg Oral Q6H PRN Charm Rings, NP   20 mg at 06/18/17 2259  . feeding supplement (ENSURE ENLIVE) (ENSURE ENLIVE) liquid 237 mL  237 mL Oral BID BM Cobos, Rockey Situ, MD   237 mL at 06/18/17 1057  . gabapentin (NEURONTIN) capsule 600 mg  600 mg Oral TID Georgiann Cocker, MD   600 mg at 06/18/17 1645  . hydrOXYzine (ATARAX/VISTARIL) tablet 25 mg  25 mg Oral Q6H PRN Charm Rings, NP   25 mg at 06/18/17 1528  . loperamide (IMODIUM) capsule 2-4 mg  2-4 mg Oral PRN Charm Rings, NP      . magnesium hydroxide (MILK OF MAGNESIA) suspension 30 mL  30 mL Oral  Daily PRN Charm Rings, NP      . methocarbamol (ROBAXIN) tablet 500 mg  500 mg Oral Q8H PRN Charm Rings, NP   500 mg at 06/19/17 9147  . multivitamin with minerals tablet 1 tablet  1 tablet Oral Daily Nira Conn A, NP   1 tablet at 06/18/17 0739  . naproxen (NAPROSYN) tablet 500 mg  500 mg Oral BID PRN Charm Rings, NP   500 mg at 06/19/17 8295  . nicotine (NICODERM CQ - dosed in mg/24 hours) patch 21 mg  21 mg Transdermal Daily Cobos, Rockey Situ, MD   21 mg at 06/18/17 0740  . ondansetron (ZOFRAN-ODT) disintegrating tablet 4 mg  4 mg Oral Q6H PRN Charm Rings, NP   4 mg at 06/18/17 2259  . prazosin (MINIPRESS) capsule 2 mg  2 mg Oral QHS Izediuno, Delight Ovens, MD   2 mg at 06/18/17 2154  . QUEtiapine (SEROQUEL) tablet 150 mg  150 mg Oral QHS Izediuno, Delight Ovens, MD   150 mg at 06/18/17 2154  . QUEtiapine (SEROQUEL) tablet 50 mg  50 mg Oral BID Armandina Stammer I, NP   50 mg at 06/18/17 1647  . sertraline (ZOLOFT) tablet 50 mg  50 mg Oral Daily Izediuno, Delight Ovens, MD  50 mg at 06/18/17 0739  . thiamine (B-1) injection 100 mg  100 mg Intramuscular Once Cobos, Fernando A, MD      . thiamine (VITAMIN B-1) tablet 100 mg  100 mg Oral Daily Nira Conn A, NP   100 mg at 06/18/17 1610   PTA Medications: Prescriptions Prior to Admission  Medication Sig Dispense Refill Last Dose  . buPROPion (WELLBUTRIN XL) 150 MG 24 hr tablet Take 150 mg XL daily for 1 week, then increase to 300 mg XL daily thereafter (Patient not taking: Reported on 06/15/2017) 45 tablet 0 Not Taking at Unknown time  . cephALEXin (KEFLEX) 500 MG capsule Take 1 capsule (500 mg total) by mouth 4 (four) times daily. 40 capsule 0 Past Week at Unknown time  . gabapentin (NEURONTIN) 400 MG capsule Take 2 capsules (800 mg total) by mouth 4 (four) times daily. For anxiety, alcohol use, pain (Patient not taking: Reported on 06/15/2017) 240 capsule 0 Not Taking at Unknown time  . gabapentin (NEURONTIN) 800 MG tablet Take 800 mg by mouth  4 (four) times daily.  1 06/15/2017 at Unknown time  . hydrOXYzine (ATARAX/VISTARIL) 50 MG tablet Take 1 tablet (50 mg total) by mouth 3 (three) times daily as needed for anxiety. (Patient not taking: Reported on 06/15/2017) 30 tablet 0 Not Taking at Unknown time  . Multiple Vitamin (MULTIVITAMIN WITH MINERALS) TABS tablet Take 1 tablet by mouth daily. (Patient not taking: Reported on 06/15/2017) 30 tablet 0 Not Taking at Unknown time  . nicotine polacrilex (NICORETTE) 2 MG gum Take 1 each (2 mg total) by mouth as needed for smoking cessation. (Patient not taking: Reported on 06/15/2017) 100 tablet 0 Not Taking at Unknown time  . pantoprazole (PROTONIX) 40 MG tablet Take 1 tablet (40 mg total) by mouth daily. (Patient not taking: Reported on 06/15/2017) 30 tablet 0 Not Taking at Unknown time  . prazosin (MINIPRESS) 1 MG capsule Take 3 capsules (3 mg total) by mouth at bedtime. For nightmares (Patient not taking: Reported on 06/15/2017) 90 capsule 0 Not Taking at Unknown time  . QUEtiapine (SEROQUEL) 100 MG tablet Take 1 tablet (100 mg total) by mouth at bedtime. (Patient not taking: Reported on 06/15/2017) 30 tablet 0 Not Taking at Unknown time  . QUEtiapine (SEROQUEL) 50 MG tablet Take 1 tablet (50 mg total) by mouth 2 (two) times daily. (Patient not taking: Reported on 06/15/2017) 60 tablet 0 Not Taking at Unknown time  . sertraline (ZOLOFT) 100 MG tablet Take 2 tablets (200 mg total) by mouth daily. For depression (Patient not taking: Reported on 06/15/2017) 60 tablet 0 Not Taking at Unknown time  . thiamine 100 MG tablet Take 1 tablet (100 mg total) by mouth daily. (Patient not taking: Reported on 06/15/2017) 30 tablet 0 Not Taking at Unknown time  . traZODone (DESYREL) 100 MG tablet Take 1 tablet (100 mg total) by mouth at bedtime. (Patient not taking: Reported on 06/15/2017) 30 tablet 0 Not Taking at Unknown time    Patient Stressors: Financial difficulties Medication change or noncompliance Substance  abuse  Patient Strengths: Average or above average intelligence Communication skills Supportive family/friends  Treatment Modalities: Medication Management, Group therapy, Case management,  1 to 1 session with clinician, Psychoeducation, Recreational therapy.   Physician Treatment Plan for Primary Diagnosis: Major depressive disorder, recurrent severe without psychotic features (HCC) Long Term Goal(s): Improvement in symptoms so as ready for discharge Improvement in symptoms so as ready for discharge   Short Term Goals: Ability to  identify changes in lifestyle to reduce recurrence of condition will improve Ability to disclose and discuss suicidal ideas Ability to demonstrate self-control will improve Ability to identify and develop effective coping behaviors will improve Compliance with prescribed medications will improve Ability to identify triggers associated with substance abuse/mental health issues will improve  Medication Management: Evaluate patient's response, side effects, and tolerance of medication regimen.  Therapeutic Interventions: 1 to 1 sessions, Unit Group sessions and Medication administration.  Evaluation of Outcomes: Progressing  Physician Treatment Plan for Secondary Diagnosis: Principal Problem:   Major depressive disorder, recurrent severe without psychotic features (HCC) Active Problems:   Alcohol dependence with alcohol-induced mood disorder (HCC)  Long Term Goal(s): Improvement in symptoms so as ready for discharge Improvement in symptoms so as ready for discharge   Short Term Goals: Ability to identify changes in lifestyle to reduce recurrence of condition will improve Ability to disclose and discuss suicidal ideas Ability to demonstrate self-control will improve Ability to identify and develop effective coping behaviors will improve Compliance with prescribed medications will improve Ability to identify triggers associated with substance abuse/mental  health issues will improve     Medication Management: Evaluate patient's response, side effects, and tolerance of medication regimen.  Therapeutic Interventions: 1 to 1 sessions, Unit Group sessions and Medication administration.  Evaluation of Outcomes: Progressing   RN Treatment Plan for Primary Diagnosis: Major depressive disorder, recurrent severe without psychotic features (HCC) Long Term Goal(s): Knowledge of disease and therapeutic regimen to maintain health will improve  Short Term Goals: Ability to remain free from injury will improve, Ability to participate in decision making will improve, Ability to verbalize feelings will improve and Ability to disclose and discuss suicidal ideas  Medication Management: RN will administer medications as ordered by provider, will assess and evaluate patient's response and provide education to patient for prescribed medication. RN will report any adverse and/or side effects to prescribing provider.  Therapeutic Interventions: 1 on 1 counseling sessions, Psychoeducation, Medication administration, Evaluate responses to treatment, Monitor vital signs and CBGs as ordered, Perform/monitor CIWA, COWS, AIMS and Fall Risk screenings as ordered, Perform wound care treatments as ordered.  Evaluation of Outcomes: Progressing   LCSW Treatment Plan for Primary Diagnosis: Major depressive disorder, recurrent severe without psychotic features (HCC) Long Term Goal(s): Safe transition to appropriate next level of care at discharge, Engage patient in therapeutic group addressing interpersonal concerns.  Short Term Goals: Engage patient in aftercare planning with referrals and resources, Facilitate patient progression through stages of change regarding substance use diagnoses and concerns and Identify triggers associated with mental health/substance abuse issues  Therapeutic Interventions: Assess for all discharge needs, 1 to 1 time with Social worker, Explore  available resources and support systems, Assess for adequacy in community support network, Educate family and significant other(s) on suicide prevention, Complete Psychosocial Assessment, Interpersonal group therapy.  Evaluation of Outcomes: Progressing   Progress in Treatment: Attending groups: Yes. Participating in groups: Yes. Taking medication as prescribed: Yes. Toleration medication: Yes. Family/Significant other contact made: SPE completed with pt; pt declined to consent to family contact.  Patient understands diagnosis: Yes. Discussing patient identified problems/goals with staff: Yes. Medical problems stabilized or resolved: Yes. Denies suicidal/homicidal ideation: Yes. Issues/concerns per patient self-inventory: No. Other: n/a  New problem(s) identified: No, Describe:  n/a  New Short Term/Long Term Goal(s): medication management for detox/mood stabilization; elimination of SI thoughts, and development of comprehensive mental wellness/sobriety plan.   Patient Goal: "to get stable and get my medications straight. To  detox."   Discharge Plan or Barriers: Pt plans to go to [facility] halfway house at discharge and will follow-up with [facility] for suboxone maintenance. [facility] pamphlet and AA/NA list provided.   Reason for Continuation of Hospitalization: Anxiety Depression Medication stabilization Suicidal ideation Withdrawal symptoms  Estimated Length of Stay: Thursday 06/20/17  Attendees: Patient: 06/19/2017 8:31 AM  Physician: Dr. Jackquline Berlin MD 06/19/2017 8:31 AM  Nursing: Celene Skeen RN 06/19/2017 8:31 AM  RN Care Manager: Onnie Boer CM 06/19/2017 8:31 AM  Social Worker: Trula Slade, LCSW 06/19/2017 8:31 AM  Recreational Therapist: x 06/19/2017 8:31 AM  Other: Armandina Stammer NP; Feliz Beam Money NP 06/19/2017 8:31 AM  Other:  06/19/2017 8:31 AM  Other: 06/19/2017 8:31 AM    Scribe for Treatment Team: Ledell Peoples Smart, LCSW 06/19/2017 8:31 AM

## 2017-06-19 NOTE — Progress Notes (Signed)
Patient ID: [name] PennerGary M [name], male   DOB: 04-08-1976, 41 y.o.   MRN: [id number] D  ---   Pt  Has passive SI, but agrees to contract for safety , but Clinical research associatewriter does no trust his sincerity.  He has been moody, irritable and an acuity on the unit today.  Pt is possibly medication seeking and is easily angered if he is not given what he wants , when he wants it.  Writer explained tom the pt that this is NOT a  "medication on demand " facility.  Pt repeatedly refers to what he gets " on the street, from my provider".    He appears confused about the difference between a street dealer and a Physician.   Pt has issues with getting along with peers . Other pts have limited interaction with this pt.  He appears disheveled and un-kept and not vested in treatment.  ---  A ---  Provide support and safety  --  R ---  Pt remains safe , but irritable on unit

## 2017-06-19 NOTE — [facility] Group Notes (Signed)
LCSW Group Therapy Note  06/19/2017 1:15pm  Type of Therapy/Topic:  Group Therapy:  Balance in Life  Participation Level:  Active however came in/out of room several times and appeared to have difficulty focusing  Description of Group:    This group will address the concept of balance and how it feels and looks when one is unbalanced. Patients will be encouraged to process areas in their lives that are out of balance and identify reasons for remaining unbalanced. Facilitators will guide patients in utilizing problem-solving interventions to address and correct the stressor making their life unbalanced. Understanding and applying boundaries will be explored and addressed for obtaining and maintaining a balanced life. Patients will be encouraged to explore ways to assertively make their unbalanced needs known to significant others in their lives, using other group members and facilitator for support and feedback.  Therapeutic Goals: 1. Patient will identify two or more emotions or situations they have that consume much of in their lives. 2. Patient will identify signs/triggers that life has become out of balance:  3. Patient will identify two ways to set boundaries in order to achieve balance in their lives:  4. Patient will demonstrate ability to communicate their needs through discussion and/or role plays  Summary of Patient Progress:  Patient described being out of balance as a "diving board, when you get too close to the edge, you fall in."  Expressed that he had "no idea" what balance meant however was able to share analogy that other group members related to and appreciated.  "A diving board stays pretty straight except when you put weight on the end of it."  Was able to process that maintaining balance for him means not venturing out "too close to the edge", expressed shame/regret over actions taken when intoxicated.  Support from sponsor "when I am sober."    Therapeutic Modalities:    Cognitive Behavioral Therapy Solution-Focused Therapy Assertiveness Training  Sallee Langenne C Rahn Lacuesta, [place] 06/19/2017 3:09 PM

## 2017-06-20 MED ORDER — GABAPENTIN 400 MG PO CAPS
800.0000 mg | ORAL_CAPSULE | Freq: Three times a day (TID) | ORAL | Status: DC
  Administered 2017-06-20 – 2017-06-22 (×6): 800 mg via ORAL
  Filled 2017-06-20 (×11): qty 2

## 2017-06-20 MED ORDER — FLUOXETINE HCL 20 MG PO CAPS
40.0000 mg | ORAL_CAPSULE | Freq: Every day | ORAL | Status: DC
Start: 2017-06-21 — End: 2017-06-22
  Administered 2017-06-21 – 2017-06-22 (×2): 40 mg via ORAL
  Filled 2017-06-20 (×4): qty 2

## 2017-06-20 NOTE — [facility] Group Notes (Signed)
LCSW Group Therapy Note  06/20/2017 1:15pm  Type of Therapy/Topic:  Group Therapy:  Balance in Life  Participation Level:  Active  Description of Group:    This group will address the concept of balance and how it feels and looks when one is unbalanced. Patients will be encouraged to process areas in their lives that are out of balance and identify reasons for remaining unbalanced. Facilitators will guide patients in utilizing problem-solving interventions to address and correct the stressor making their life unbalanced. Understanding and applying boundaries will be explored and addressed for obtaining and maintaining a balanced life. Patients will be encouraged to explore ways to assertively make their unbalanced needs known to significant others in their lives, using other group members and facilitator for support and feedback.  Therapeutic Goals: 1. Patient will identify two or more emotions or situations they have that consume much of in their lives. 2. Patient will identify signs/triggers that life has become out of balance:  3. Patient will identify two ways to set boundaries in order to achieve balance in their lives:  4. Patient will demonstrate ability to communicate their needs through discussion and/or role plays  Summary of Patient Progress:  Pt attended group and stayed the entire time. Pt was irritable throughout group. He states he is still having withdraw symptoms but the doctor will not prescribe any more medications for the withdrawal symptoms.     Therapeutic Modalities:   Cognitive Behavioral Therapy Solution-Focused Therapy Assertiveness Training  Rondall AllegraCandace L Tatijana Bierly, [place] 06/20/2017 2:33 PM

## 2017-06-20 NOTE — Progress Notes (Signed)
D: Pt presents with a flat affect and anxious mood. Pt reports increased depression and anxiety today. Pt endorses passive suicidal thoughts with no plan or intent. Pt verbally contracts for safety. Pt reports withdrawal symptoms of chills, sweats, skin crawling sensations and tremors. Pt frequently asking for prn meds to help decrease withdrawal symptoms. Pt verbalized tolerating meds well. No side effects to meds verbalized by pt. A: Mediations reviewed with pt. Medications administered as ordered per MD. Verbal support provided. Pt encouraged to attend groups. 15 minute checks performed for safety. R: Pt compliant with tx.

## 2017-06-20 NOTE — Progress Notes (Signed)
[facility] MD Progress Note  06/20/2017 2:52 PM [name]  MRN:  [id number] Subjective:    41 y.o Caucasian [name] [name], single,currently homeless. Background history of SUD, Mood Disorder. Self presented to the hospital requesting detox. Relapsed four months ago. Has been drinking regularly, has been using opiates mainly. Reports auditory hallucinations telling him to jump off the bridge. UDS is positive for opiates, THC and amphetamine. BAL was <5  Chart reviewed today. Patient discussed at team today.  Staff reports that he has been moody and irritable. He still voices suicidal thoughts. He has been requesting for multiple PRN medications.    Seen today. Had his last dose of Librium today. Irritable as he feels he needs it longer. Talked about how he used to get more days on Librium and Clonidine. Says he has been thinking of making sure he does not relapse. Ways of changing his lifestyle when he gets discharged gives him anxiety. Says his girlfriend has been calling his sponsor. Knows he would relapse if they get back together. Says suicidal thoughts are lessening. No intent to act in here.   Principal Problem: Major depressive disorder, recurrent severe without psychotic features (HCC) Diagnosis:  Substance Induced Mood Disorder Patient Active Problem List   Diagnosis Date Noted  . Alcohol use disorder, severe, dependence (HCC) [F10.20]   . Major depressive disorder, recurrent severe without psychotic features (HCC) [F33.2] 12/03/2016  . ETOH abuse [F10.10]   . Cirrhosis (HCC) [K74.60] 05/24/2015  . Suicidal ideation [R45.851]   . Recurrent cellulitis of lower leg [L03.119] 11/09/2014  . Venous (peripheral) insufficiency [I87.2] 11/09/2014  . Bipolar affective disorder (HCC) [F31.9]   . Alcohol dependence with alcohol-induced mood disorder (HCC) [F10.24]   . Opioid dependence with opioid-induced mood disorder (HCC) [F11.24]   . Severe recurrent major depression without psychotic features (HCC)  [F33.2] 09/09/2014  . MDD (major depressive disorder) [F32.9] 09/09/2014  . Suicidal ideations [R45.851]   . Chronic pain syndrome [G89.4] 05/31/2014  . Tobacco dependence [F17.200] 05/30/2014  . Poor dentition [K08.9] 02/03/2014  . Hepatitis C virus infection without hepatic coma [B19.20] 02/03/2014  . Sepsis (HCC) [A41.9] 01/29/2014  . Anxiety state, unspecified [F41.1] 01/31/2013  . Mood disorder in conditions classified elsewhere [F06.30] 01/31/2013  . 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 Alcohol withdrawal (HCC) [F10.239] 01/27/2013    Class: Acute   Total Time spent with patient: 20 minutes  Past Psychiatric History: As in H&P  Past Medical History:  Past Medical History:  Diagnosis Date  . Bipolar disorder (HCC)   . Cellulitis   . Depression   . Hepatitis C     Past Surgical History:  Procedure Laterality Date  . BACK SURGERY    . ROTATOR CUFF REPAIR    . SHOULDER SURGERY     Family History:  Family History  Problem Relation Age of Onset  . Alcohol abuse Father   . Cancer Maternal Aunt    Family Psychiatric  History: As in H&P Social History:  History  Alcohol Use  . 7.2 oz/week  . 12 Cans of beer per week    Comment: heavy     History  Drug Use No    Comment: denies at this time    Social History   Social History  . Marital status: Divorced    Spouse name: N/A  . Number of children: N/A  . Years of education: N/A   Social History Main  Topics  . Smoking status: Current Every Day Smoker    Packs/day: 1.00    Types: Cigarettes    Start date: 10/22/1981  . Smokeless tobacco: Never Used  . Alcohol use 7.2 oz/week    12 Cans of beer per week     Comment: heavy  . Drug use: No     Comment: denies at this time  . Sexual activity: Not Currently   Other Topics Concern  . None   Social History Narrative  . None   Additional Social History:        Sleep:  Better  Appetite:  Better  Current Medications: Current Facility-Administered Medications  Medication Dose Route Frequency Provider Last Rate Last Dose  . acetaminophen (TYLENOL) tablet 650 mg  650 mg Oral Q6H PRN Charm RingsLord, Jamison Y, NP      . alum & mag hydroxide-simeth (MAALOX/MYLANTA) 200-200-20 MG/5ML suspension 30 mL  30 mL Oral Q4H PRN Charm RingsLord, Jamison Y, NP      . Melene Muller[START ON 06/21/2017] cloNIDine (CATAPRES) tablet 0.1 mg  0.1 mg Oral QAC breakfast Charm RingsLord, Jamison Y, NP      . dicyclomine (BENTYL) tablet 20 mg  20 mg Oral Q6H PRN Charm RingsLord, Jamison Y, NP   20 mg at 06/20/17 1110  . feeding supplement (ENSURE ENLIVE) (ENSURE ENLIVE) liquid 237 mL  237 mL Oral BID BM Cobos, Rockey SituFernando A, MD   237 mL at 06/19/17 1500  . FLUoxetine (PROZAC) capsule 30 mg  30 mg Oral Daily Herlinda Heady, Delight OvensVincent A, MD   30 mg at 06/20/17 0758  . gabapentin (NEURONTIN) capsule 600 mg  600 mg Oral TID Georgiann CockerIzediuno, Lindie Roberson A, MD   600 mg at 06/20/17 1206  . hydrOXYzine (ATARAX/VISTARIL) tablet 25 mg  25 mg Oral Q6H PRN Charm RingsLord, Jamison Y, NP   25 mg at 06/20/17 1109  . loperamide (IMODIUM) capsule 2-4 mg  2-4 mg Oral PRN Charm RingsLord, Jamison Y, NP      . magnesium hydroxide (MILK OF MAGNESIA) suspension 30 mL  30 mL Oral Daily PRN Charm RingsLord, Jamison Y, NP      . methocarbamol (ROBAXIN) tablet 500 mg  500 mg Oral Q8H PRN Charm RingsLord, Jamison Y, NP   500 mg at 06/20/17 0028  . multivitamin with minerals tablet 1 tablet  1 tablet Oral Daily Nira ConnBerry, Jason A, NP   1 tablet at 06/20/17 0757  . naproxen (NAPROSYN) tablet 500 mg  500 mg Oral BID PRN Charm RingsLord, Jamison Y, NP   500 mg at 06/20/17 0028  . nicotine (NICODERM CQ - dosed in mg/24 hours) patch 21 mg  21 mg Transdermal Daily Cobos, Rockey SituFernando A, MD   21 mg at 06/20/17 0757  . ondansetron (ZOFRAN-ODT) disintegrating tablet 4 mg  4 mg Oral Q6H PRN Charm RingsLord, Jamison Y, NP   4 mg at 06/20/17 1110  . prazosin (MINIPRESS) capsule 4 mg  4 mg Oral QHS Bailei Buist, Delight OvensVincent A, MD   4 mg at 06/20/17 0026  . QUEtiapine (SEROQUEL)  tablet 200 mg  200 mg Oral QHS Addilyn Satterwhite, Delight OvensVincent A, MD   200 mg at 06/20/17 0025  . QUEtiapine (SEROQUEL) tablet 50 mg  50 mg Oral BID Armandina StammerNwoko, Agnes I, NP   50 mg at 06/20/17 0757  . thiamine (B-1) injection 100 mg  100 mg Intramuscular Once Cobos, Fernando A, MD      . thiamine (VITAMIN B-1) tablet 100 mg  100 mg Oral Daily Nira ConnBerry, Jason A, NP   100 mg at 06/20/17 [phone]  Lab Results:  No results found for this or any previous visit (from the past 48 hour(s)).  Blood Alcohol level:  Lab Results  Component Value Date   ETH <5 06/15/2017   ETH 60 (H) 11/27/2016    Metabolic Disorder Labs: Lab Results  Component Value Date   HGBA1C 5.7 (H) 06/18/2017   MPG 116.89 06/18/2017   MPG 120 12/03/2016   No results found for: PROLACTIN Lab Results  Component Value Date   CHOL 165 06/18/2017   TRIG 241 (H) 06/18/2017   HDL 41 06/18/2017   CHOLHDL 4.0 06/18/2017   VLDL 48 (H) 06/18/2017   LDLCALC 76 06/18/2017    Physical Findings: AIMS: Facial and Oral Movements Muscles of Facial Expression: None, normal Lips and Perioral Area: None, normal Jaw: None, normal Tongue: None, normal,Extremity Movements Upper (arms, wrists, hands, fingers): None, normal Lower (legs, knees, ankles, toes): None, normal, Trunk Movements Neck, shoulders, hips: None, normal, Overall Severity Severity of abnormal movements (highest score from questions above): None, normal Incapacitation due to abnormal movements: None, normal Patient's awareness of abnormal movements (rate only patient's report): No Awareness, Dental Status Current problems with teeth and/or dentures?: No Does patient usually wear dentures?: No  CIWA:  CIWA-Ar Total: 6 COWS:  COWS Total Score: 7  Musculoskeletal: Strength & Muscle Tone: within normal limits Gait & Station: normal Patient leans: N/A  Psychiatric Specialty Exam: Physical Exam  Constitutional: He appears well-developed and well-nourished.  HENT:  Head: Normocephalic  and atraumatic.  Respiratory: Effort normal.  Neurological: He is alert.  Skin: Skin is warm and dry.  Psychiatric:  As above    ROS  Blood pressure 124/78, pulse 66, temperature 98.2 F (36.8 C), temperature source Oral, resp. rate 18, height 5\' 10"  (1.778 m), weight 85.7 kg (189 lb), SpO2 97 %.Body mass index is 27.12 kg/m.  General Appearance: Casually dressed. Not shaky, not sweaty, not confused. Not internally stimulated.   Eye Contact:  Good  Speech:  Normal rate and tone  Volume:  Normal  Mood:  Dysphoric and Irritable  Affect:  Restricted and mood congruent.  Thought Process:  Linear  Orientation:  Full (Time, Place, and Person)  Thought Content:  Focused on getting more medication. No thoughts of violence.  No hallucinations. No delusional theme.   Suicidal Thoughts:  Still off and on  Homicidal Thoughts:  No  Memory:  WNL  Judgement:  Better  Insight:  Limited  Psychomotor Activity:  Normal  Concentration:  Concentration: Fair and Attention Span: Fair  Recall:  [employer] of Knowledge:  Good  Language:  Good  Akathisia:  Negative  Handed:    AIMS (if indicated):     Assets:  Desire for Improvement Resilience  ADL's:  Intact  Cognition:  WNL  Sleep:  Number of Hours: 5.75     Assessment and Plan   Patient is still coming off multiple psychoactive substances. Irritability and dysphoria is marginally better. No hallucinations. Suicidal thoughts are lessening.  We agreed to adjust his medications further today. Hopeful discharge by weekend if he makes progress.   Psychiatric: SUD SIPD PTSD MDD   Medical: HCV Cirrhosis  Psychosocial:  Homelessness Limited support  PLAN: 1. Increase Gabapentin to 800 mg TID 2. Increase Fluoxetine to 40 mg tomorrow.  3. Continue to monitor mood, behavior and interaction with peers  Georgiann Cocker, MD 06/20/2017, 2:52 PMPatient ID: [name] [name], [name] [name]   DOB: 1976-04-15, 41 y.o.   MRN: [id number] Patient ID:  [name] [name], [name] [name]   DOB: 11-Jun-1976, 41 y.o.   MRN: [id number]

## 2017-06-20 NOTE — Progress Notes (Addendum)
Nursing Progress Note: 7p-7a D: Pt currently presents with a anxious/agitated/angry/verbally threatening affect and behavior. Pt states "I need my Librium. Why are yall motherfucker tapering it off. Francesca OmanYall just expect me to be taking a med all the time and now not as much? If I hit somebody would they let me go? I swear I cannot believe this." Interacting appropriately with the milieu. Pt reports good sleep during the previous night with current medication regimen.   A: Pt provided with medications per providers orders. Pt's labs and vitals were monitored throughout the night. Pt supported emotionally and encouraged to express concerns and questions. Pt educated on medications.  R: Pt's safety ensured with 15 minute and environmental checks. Pt currently denies HI and AVH and endorses passive SI. Pt verbally contracts to seek staff if SI,HI, or AVH occurs and to consult with staff before acting on any harmful thoughts. Will continue to monitor.

## 2017-06-21 MED ORDER — QUETIAPINE FUMARATE 300 MG PO TABS
300.0000 mg | ORAL_TABLET | Freq: Every day | ORAL | Status: DC
Start: 2017-06-21 — End: 2017-06-22
  Administered 2017-06-21: 300 mg via ORAL
  Filled 2017-06-21 (×3): qty 1

## 2017-06-21 MED ORDER — LOPERAMIDE HCL 2 MG PO CAPS: 2.0000 mg | ORAL_CAPSULE | ORAL | Status: DC | PRN

## 2017-06-21 MED ORDER — DICYCLOMINE HCL 20 MG PO TABS: 20.0000 mg | ORAL_TABLET | Freq: Four times a day (QID) | ORAL | Status: DC | PRN

## 2017-06-21 MED ORDER — ONDANSETRON 4 MG PO TBDP: 4.0000 mg | ORAL_TABLET | Freq: Four times a day (QID) | ORAL | Status: DC | PRN

## 2017-06-21 MED ORDER — METHOCARBAMOL 500 MG PO TABS: 500.0000 mg | ORAL_TABLET | Freq: Three times a day (TID) | ORAL | Status: DC | PRN

## 2017-06-21 NOTE — Progress Notes (Signed)
[facility] MD Progress Note  06/21/2017 2:03 PM [name] [name]  MRN:  [id number] Subjective:    41 y.o Caucasian male, single,currently homeless. Background history of SUD, Mood Disorder. Self presented to the hospital requesting detox. Relapsed four months ago. Has been drinking regularly, has been using opiates mainly. Reports auditory hallucinations telling him to jump off the bridge. UDS is positive for opiates, THC and amphetamine. BAL was <5  Chart reviewed today. Patient discussed at team today.  Staff reports that he was upset some his PRN's dropped off. Objective substance withdrawal scales are within normal limits. He has not been observed to be confused or psychotic. Not expressing any suicidal thoughts.   Seen today. Still reports subjective anxiety. Hopeful as it is getting better. Some worries about how he would adjust his lifestyle post discharge. Says he is still having "use dreams". Also has nightmares last night. Knows his discharge plans would offer him a lot of support. Optimistic he would use his resources well. No suicidal thoughts. No hallucination in any modality. No thoughts of violence. No homicidal thoughts. Looks forward towards discharge tomorrow. 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   Principal Problem: Major depressive disorder, recurrent severe without psychotic features (HCC) Diagnosis:  Substance Induced Mood Disorder Patient Active Problem List   Diagnosis Date Noted  . Alcohol use disorder, severe, dependence (HCC) [F10.20]   . Major depressive disorder, recurrent severe without psychotic features (HCC) [F33.2] 12/03/2016  . ETOH abuse [F10.10]   . Cirrhosis (HCC) [K74.60] 05/24/2015  . Suicidal ideation [R45.851]   . Recurrent cellulitis of lower leg [L03.119] 11/09/2014  . Venous (peripheral) insufficiency [I87.2] 11/09/2014  . Bipolar affective disorder (HCC) [F31.9]   . Alcohol dependence with alcohol-induced mood  disorder (HCC) [F10.24]   . Opioid dependence with opioid-induced mood disorder (HCC) [F11.24]   . Severe recurrent major depression without psychotic features (HCC) [F33.2] 09/09/2014  . MDD (major depressive disorder) [F32.9] 09/09/2014  . Suicidal ideations [R45.851]   . Chronic pain syndrome [G89.4] 05/31/2014  . Tobacco dependence [F17.200] 05/30/2014  . Poor dentition [K08.9] 02/03/2014  . Hepatitis C virus infection without hepatic coma [B19.20] 02/03/2014  . Sepsis (HCC) [A41.9] 01/29/2014  . Anxiety state, unspecified [F41.1] 01/31/2013  . Mood disorder in conditions classified elsewhere [F06.30] 01/31/2013  . 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 Alcohol withdrawal (HCC) [F10.239] 01/27/2013    Class: Acute   Total Time spent with patient: 20 minutes  Past Psychiatric History: As in H&P  Past Medical History:  Past Medical History:  Diagnosis Date  . Bipolar disorder (HCC)   . Cellulitis   . Depression   . Hepatitis C     Past Surgical History:  Procedure Laterality Date  . BACK SURGERY    . ROTATOR CUFF REPAIR    . SHOULDER SURGERY     Family History:  Family History  Problem Relation Age of Onset  . Alcohol abuse Father   . Cancer Maternal Aunt    Family Psychiatric  History: As in H&P Social History:  History  Alcohol Use  . 7.2 oz/week  . 12 Cans of beer per week    Comment: heavy     History  Drug Use No    Comment: denies at this time    Social History   Social History  . Marital status: Divorced    Spouse  name: N/A  . Number of children: N/A  . Years of education: N/A   Social History Main Topics  . Smoking status: Current Every Day Smoker    Packs/day: 1.00    Types: Cigarettes    Start date: 10/22/1981  . Smokeless tobacco: Never Used  . Alcohol use 7.2 oz/week    12 Cans of beer per week     Comment: heavy  . Drug use: No     Comment: denies at  this time  . Sexual activity: Not Currently   Other Topics Concern  . None   Social History Narrative  . None   Additional Social History:        Sleep: Better  Appetite:  Better  Current Medications: Current Facility-Administered Medications  Medication Dose Route Frequency Provider Last Rate Last Dose  . acetaminophen (TYLENOL) tablet 650 mg  650 mg Oral Q6H PRN Charm RingsLord, Jamison Y, NP      . alum & mag hydroxide-simeth (MAALOX/MYLANTA) 200-200-20 MG/5ML suspension 30 mL  30 mL Oral Q4H PRN Charm RingsLord, Jamison Y, NP      . cloNIDine (CATAPRES) tablet 0.1 mg  0.1 mg Oral QAC breakfast Charm RingsLord, Jamison Y, NP   0.1 mg at 06/21/17 0610  . feeding supplement (ENSURE ENLIVE) (ENSURE ENLIVE) liquid 237 mL  237 mL Oral BID BM Cobos, Rockey SituFernando A, MD   237 mL at 06/19/17 1500  . FLUoxetine (PROZAC) capsule 40 mg  40 mg Oral Daily Izediuno, Delight OvensVincent A, MD   40 mg at 06/21/17 0849  . gabapentin (NEURONTIN) capsule 800 mg  800 mg Oral TID Georgiann CockerIzediuno, Vincent A, MD   800 mg at 06/21/17 1158  . hydrOXYzine (ATARAX/VISTARIL) tablet 25 mg  25 mg Oral Q6H PRN Money, Gerlene Burdockravis B, FNP   25 mg at 06/21/17 1158  . magnesium hydroxide (MILK OF MAGNESIA) suspension 30 mL  30 mL Oral Daily PRN Charm RingsLord, Jamison Y, NP      . multivitamin with minerals tablet 1 tablet  1 tablet Oral Daily Nira ConnBerry, Jason A, NP   1 tablet at 06/21/17 0849  . naproxen (NAPROSYN) tablet 500 mg  500 mg Oral BID PRN Money, Gerlene Burdockravis B, FNP   500 mg at 06/21/17 1158  . nicotine (NICODERM CQ - dosed in mg/24 hours) patch 21 mg  21 mg Transdermal Daily Cobos, Rockey SituFernando A, MD   21 mg at 06/21/17 0949  . prazosin (MINIPRESS) capsule 4 mg  4 mg Oral QHS Izediuno, Delight OvensVincent A, MD   4 mg at 06/20/17 2052  . QUEtiapine (SEROQUEL) tablet 200 mg  200 mg Oral QHS Izediuno, Delight OvensVincent A, MD   200 mg at 06/20/17 2052  . QUEtiapine (SEROQUEL) tablet 50 mg  50 mg Oral BID Armandina StammerNwoko, Agnes I, NP   50 mg at 06/21/17 0849  . thiamine (B-1) injection 100 mg  100 mg Intramuscular Once  Cobos, Fernando A, MD      . thiamine (VITAMIN B-1) tablet 100 mg  100 mg Oral Daily Nira ConnBerry, Jason A, NP   100 mg at 06/21/17 [id number]    Lab Results:  No results found for this or any previous visit (from the past 48 hour(s)).  Blood Alcohol level:  Lab Results  Component Value Date   ETH <5 06/15/2017   ETH 60 (H) 11/27/2016    Metabolic Disorder Labs: Lab Results  Component Value Date   HGBA1C 5.7 (H) 06/18/2017   MPG 116.89 06/18/2017   MPG 120 12/03/2016   No results  found for: PROLACTIN Lab Results  Component Value Date   CHOL 165 06/18/2017   TRIG 241 (H) 06/18/2017   HDL 41 06/18/2017   CHOLHDL 4.0 06/18/2017   VLDL 48 (H) 06/18/2017   LDLCALC 76 06/18/2017    Physical Findings: AIMS: Facial and Oral Movements Muscles of Facial Expression: None, normal Lips and Perioral Area: None, normal Jaw: None, normal Tongue: None, normal,Extremity Movements Upper (arms, wrists, hands, fingers): None, normal Lower (legs, knees, ankles, toes): None, normal, Trunk Movements Neck, shoulders, hips: None, normal, Overall Severity Severity of abnormal movements (highest score from questions above): None, normal Incapacitation due to abnormal movements: None, normal Patient's awareness of abnormal movements (rate only patient's report): No Awareness, Dental Status Current problems with teeth and/or dentures?: No Does patient usually wear dentures?: No  CIWA:  CIWA-Ar Total: 0 COWS:  COWS Total Score: 5  Musculoskeletal: Strength & Muscle Tone: within normal limits Gait & Station: normal Patient leans: N/A  Psychiatric Specialty Exam: Physical Exam  Constitutional: He appears well-developed and well-nourished.  HENT:  Head: Normocephalic and atraumatic.  Respiratory: Effort normal.  Neurological: He is alert.  Skin: Skin is warm and dry.  Psychiatric:  As above    ROS  Blood pressure (!) 127/58, pulse 69, temperature 98.2 F (36.8 C), temperature source Oral, resp.  rate 18, height 5\' 10"  (1.778 m), weight 85.7 kg (189 lb), SpO2 97 %.Body mass index is 27.12 kg/m.  General Appearance: Casually dressed. Good relatedness. Appropriate behavior.   Eye Contact:  Good  Speech:  Normal rate and tone  Volume:  Normal  Mood:  Much better  Affect:  Mobilizing some positive affect  Thought Process:  Linear  Orientation:  Full (Time, Place, and Person)  Thought Content:  No delusional theme. No preoccupation with violent thoughts. Much less negative ruminations. No obsession.  No hallucination in any modality.    Suicidal Thoughts:  None  Homicidal Thoughts:  No  Memory:  WNL  Judgement:  Better  Insight:  Limited  Psychomotor Activity:  Normal  Concentration:  Good  Recall:  Good  Fund of Knowledge:  Good  Language:  Good  Akathisia:  Negative  Handed:    AIMS (if indicated):     Assets:  Desire for Improvement Resilience  ADL's:  Intact  Cognition:  WNL  Sleep:  Number of Hours: 6.75     Assessment and Plan   Patient has completely come off substances. Mood is more stablizing. No hallucinations. No suicidal thoughts or homicidal thoughts. Scheduled for discharge tomorrow.   Psychiatric: SUD SIPD PTSD MDD   Medical: HCV Cirrhosis  Psychosocial:  Homelessness Limited support  PLAN: 1. Discontinue daytime Seroquel. Would make total dose at bedtime only 2. Continue to monitor mood, behavior and interaction with peers  Georgiann Cocker, MD 06/21/2017, 2:03 PMPatient ID: [name] [name], male   DOB: 1976-01-30, 41 y.o.   MRN: [id number] Patient ID: [name] [name], male   DOB: 11-10-1975, 41 y.o.   MRN: [id number] Patient ID: [name] [name], male   DOB: 1975-11-05, 41 y.o.   MRN: [id number]

## 2017-06-21 NOTE — [facility] Group Notes (Signed)
LCSW Group Therapy Note  06/21/2017 1:15pm  Type of Therapy and Topic:  Group Therapy:  Feelings around Relapse and Recovery  Participation Level:  Active   Description of Group:    Patients in this group will discuss emotions they experience before and after a relapse. They will process how experiencing these feelings, or avoidance of experiencing them, relates to having a relapse. Facilitator will guide patients to explore emotions they have related to recovery. Patients will be encouraged to process which emotions are more powerful. They will be guided to discuss the emotional reaction significant others in their lives may have to their relapse or recovery. Patients will be assisted in exploring ways to respond to the emotions of others without this contributing to a relapse.  Therapeutic Goals: 1. Patient will identify two or more emotions that lead to a relapse for them 2. Patient will identify two emotions that result when they relapse 3. Patient will identify two emotions related to recovery 4. Patient will demonstrate ability to communicate their needs through discussion and/or role plays   Summary of Patient Progress:  [name] HiddenGary was attentive and engaged during today's processing group. He shared that he feels a void when he is sober. "there's still so much sadness. I feel alone." [name] HiddenGary explored these feelings with other group members and was able to identify his source of hope--"being able to maintain my sobriety and get on the right medication for depression." He continues to show progress in the group setting with improving insight.    Therapeutic Modalities:   Cognitive Behavioral Therapy Solution-Focused Therapy Assertiveness Training Relapse Prevention Therapy   Ledell PeoplesHeather N Smart, LCSW 06/21/2017 2:55 PM

## 2017-06-21 NOTE — Progress Notes (Signed)
  [name] :  Will you be returning to the same living situation after discharge: No-[facility].  At discharge, do you have transportation home?: Yes,  [name] Humphreyee  [name] (owner of halfway house/bus pass)--discharge on Sunday per Dr. Jackquline BerlinIzediuno if pt remains stable.  Do you have the ability to pay for your medications: Yes,  medicare  Release of information consent forms completed and submitted to medical records by CSW.  Patient to Follow up at: Follow-up Information    [facility] Follow up on 06/25/2017.   Why:  Please go to facility on 9/4 at 12 noon or later; you will see MD and a therapist at that time.  Bring hospital discharge paperwork, photo ID, and any insurance card to this appointment.   Contact information: [address] [place] [address] [phone]           Next level of care provider has access to [facility] Link:no  Safety Planning and Suicide Prevention discussed: Yes,  SPE completed with pt; pt declined to consent to family contact. SPI pamphlet and Mobile Crisis information provided to pt.   Have you used any form of tobacco in the last 30 days? (Cigarettes, Smokeless Tobacco, Cigars, and/or Pipes): Yes  Has patient been referred to the Quitline?: Patient refused referral  Patient has been referred for addiction treatment: Yes  Pulte HomesHeather N Smart, LCSW 06/21/2017, 8:52 AM

## 2017-06-21 NOTE — Progress Notes (Signed)
Recreation Therapy Notes  Date: 06/21/17 Time: 0930 Location: 500 [address] Dayroom  Group Topic: Stress Management  Goal Area(s) Addresses:  Patient will verbalize importance of using healthy stress management.  Patient will identify positive emotions associated with healthy stress management.   Behavioral Response: Engaged  Intervention: Stress Management  Activity :  Progressive Muscle Relaxation.  LRT introduced the stress management technique of progressive muscle relaxation.  Patients were to follow along as LRT read script to fully engage in the technique.  Education:  Stress Management, Discharge Planning.   Education Outcome: Acknowledges edcuation/In group clarification offered/Needs additional education  Clinical Observations/Feedback: Pt attended group.   [name] [name], LRT/CTRS         [name] RancherLindsay, Yadir Zentner A 06/21/2017 12:04 PM

## 2017-06-21 NOTE — Progress Notes (Signed)
Nursing Progress Note: 7-7p  D- Mood is irritable and anxious,. Affect is labile " I don't know why your messing  with my medications,I suffer from chronic back pain. 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 Pt is able to contract for safety. Continues to have difficulty staying asleep. " I suffer from PTSD " pt was given a article on Minipress and PTSD.  A - Observed pt interacting in group and in the milieu.Support and encouragement offered, safety maintained with q 15 minutes.Pt attended treatment planning and discussed discharge plans. Pt says he would like to try suboxone when he leaves here.Marland Kitchen.  R-Contracts for safety and continues to follow treatment plan, working on learning new coping skills for anger. Pt educated on Minipress.

## 2017-06-22 MED ORDER — THIAMINE HCL 100 MG PO TABS: 100.0000 mg | ORAL_TABLET | Freq: Every day | ORAL | 0 refills | Status: DC

## 2017-06-22 MED ORDER — QUETIAPINE FUMARATE 300 MG PO TABS: 300.0000 mg | ORAL_TABLET | Freq: Every day | ORAL | 0 refills | Status: DC

## 2017-06-22 MED ORDER — ADULT MULTIVITAMIN W/MINERALS CH: 1.0000 | ORAL_TABLET | Freq: Every day | ORAL | 0 refills | Status: DC

## 2017-06-22 MED ORDER — FLUOXETINE HCL 40 MG PO CAPS: 40.0000 mg | ORAL_CAPSULE | Freq: Every day | ORAL | 0 refills | Status: DC

## 2017-06-22 MED ORDER — PRAZOSIN HCL 2 MG PO CAPS: 4.0000 mg | ORAL_CAPSULE | Freq: Every day | ORAL | 0 refills | Status: DC

## 2017-06-22 MED ORDER — GABAPENTIN 400 MG PO CAPS: 800.0000 mg | ORAL_CAPSULE | Freq: Three times a day (TID) | ORAL | 0 refills | Status: DC

## 2017-06-22 MED ORDER — HYDROXYZINE HCL 25 MG PO TABS: 25.0000 mg | ORAL_TABLET | Freq: Four times a day (QID) | ORAL | 0 refills | Status: DC | PRN

## 2017-06-22 NOTE — [facility] Group Notes (Signed)
LCSW Group Therapy Note  06/22/2017     10:00-11:00AM  Type of Therapy and Topic:  Group Therapy:  Decisional Balance/Substance Use  Participation Level:  Minimal        . Description of Group:  The main focus of today's process group was learning how to use a decisional balance exercise to make a decision about whether to change an unhealthy coping skill, as well as how to use the information gathered in the actual process of planning that change.  Patients listed some of their most frequently utilized unhealthy coping techniques and CSW pointed out the similarities.  Motivational Interviewing and the whiteboard were utilized to help patients explore in-depth the perceived benefits and costs of a specific, shared unhealthy coping technique (drinking & drugging) as well as the benefits and costs of replacing that with other, healthy coping skills.  A handout was distributed for patients to be able to do this exercise for themselves.     Therapeutic Goals 1. Patient will be able to utilize the decision balance exercise on their own 2. Patient will list coping skills they use to fulfill their needs 3. Patient will identify the differences between healthy and  unhealthy coping skills 4. Patient will verbalize the costs and benefits of drinking/drugging versus making the choice to change 5. Patient will learn how to use the exercise to identify the most important supports to put in place so that they can succeed in a change to which they commit  Summary of Patient Progress: During group, patient expressed that his unhealthy coping skill involves drinking and drugging.  He was very groggy and complained that his medication from last night was making him too drowsy.  He was somewhat irritable and eventually left group.   Therapeutic Modalities Cognitive Behavioral Therapy Motivational Interviewing   Lynnell ChadMareida J Grossman-Orr, [place] 06/22/2017 12:45 PM

## 2017-06-22 NOTE — Discharge Summary (Signed)
Physician Discharge Summary Note  Patient:  [name] [name] is an 41 y.o., male MRN:  [id number] DOB:  August 17, 1976 Patient phone:  [phone] (home)  Patient address:   [place] [address],   Total Time spent with patient: Greater than 30 minutes.  Date of Admission:  06/16/2017  Date of Discharge: 06-22-17  Reason for Admission: Increased drug use triggering auditory hallucinations.   Principal Problem: Major depressive disorder, recurrent severe without psychotic features [facility])  Discharge Diagnoses: Patient Active Problem List   Diagnosis Date Noted  . Polysubstance (including opioids) dependence with physiological dependence [facility]) [F19.20] 01/27/2013    Priority: High    Class: Acute  . Alcohol withdrawal (HCC) [F10.239] 01/27/2013    Priority: High    Class: Acute  . Bipolar affective disorder (HCC) [F31.9]     Priority: Medium  . Alcohol use disorder, severe, dependence (HCC) [F10.20]   . Major depressive disorder, recurrent severe without psychotic features (HCC) [F33.2] 12/03/2016  . ETOH abuse [F10.10]   . Cirrhosis (HCC) [K74.60] 05/24/2015  . Suicidal ideation [R45.851]   . Recurrent cellulitis of lower leg [L03.119] 11/09/2014  . Venous (peripheral) insufficiency [I87.2] 11/09/2014  . Alcohol dependence with alcohol-induced mood disorder (HCC) [F10.24]   . Opioid dependence with opioid-induced mood disorder (HCC) [F11.24]   . Severe recurrent major depression without psychotic features (HCC) [F33.2] 09/09/2014  . MDD (major depressive disorder) [F32.9] 09/09/2014  . Suicidal ideations [R45.851]   . Chronic pain syndrome [G89.4] 05/31/2014  . Tobacco dependence [F17.200] 05/30/2014  . Poor dentition [K08.9] 02/03/2014  . Hepatitis C virus infection without hepatic coma [B19.20] 02/03/2014  . Sepsis (HCC) [A41.9] 01/29/2014  . Anxiety state, unspecified [F41.1] 01/31/2013  . Mood disorder in conditions classified elsewhere [F06.30] 01/31/2013   Past  Psychiatric History: Opioid use disorder,   Past Medical History:  Past Medical History:  Diagnosis Date  . Bipolar disorder (HCC)   . Cellulitis   . Depression   . Hepatitis C     Past Surgical History:  Procedure Laterality Date  . BACK SURGERY    . ROTATOR CUFF REPAIR    . SHOULDER SURGERY     Family History:  Family History  Problem Relation Age of Onset  . Alcohol abuse Father   . Cancer Maternal Aunt    Family Psychiatric  History: See H&P  Social History:  History  Alcohol Use  . 7.2 oz/week  . 12 Cans of beer per week    Comment: heavy     History  Drug Use No    Comment: denies at this time    Social History   Social History  . Marital status: Divorced    Spouse name: N/A  . Number of children: N/A  . Years of education: N/A   Social History Main Topics  . Smoking status: Current Every Day Smoker    Packs/day: 1.00    Types: Cigarettes    Start date: 10/22/1981  . Smokeless tobacco: Never Used  . Alcohol use 7.2 oz/week    12 Cans of beer per week     Comment: heavy  . Drug use: No     Comment: denies at this time  . Sexual activity: Not Currently   Other Topics Concern  . None   Social History Narrative  . None   Hospital Course: [name] is a 41 y.o Caucasian male, single, currently homeless. Background history of SUD, Mood Disorder. Self presented to the hospital requesting detox. Relapsed four  months ago. Has been drinking regularly, has been using opiates mainly. Reports auditory hallucinations telling him to jump off the bridge. UDS is positive for opiates, THC and amphetamine. BAL was <5.  As noted above, [name] was admitted to the [facility] adult unit with complains of auditory hallucinations telling him to jump off a bridge. He cited a recent relapse on drugs & being off of his mental health medications as the trigger. He does have hx of suicide attempt by overdose in his teen years. He has hx history of mental illness, multiple inpatient  psychiatric admissions. He has in the past received & had been tried on several psychotropic medications, but was never compliant due to drug use. He was admitted this time for drug detoxification & mood stabilization treatments. He has been referred & placed in a long term substance abuse treatment centers/programs for further treatment after discharge on several other occasion prior to this present hospitalization. However, he continue to relapse & use drugs.  After evaluation of his presenting symptoms, [name] received Librium/Clonidine detoxification treatment protocols for his drug detox. Besides the detoxification treatment protocols, [name] was also medicated & discharged on; Prozac 40 mg for depression, Gabapentin 800 mg for agitation/substance withdrawal syndrome, Hydroxyzine 25 mg prn for anxiety, Minipress 2 mg for nightmares & Seroquel 300 mg for mood control,   [name] was also enrolled & participated in the group counseling sessions & AA/NA meetings being offered & held on this unit to learn coping skills . He participated & learned coping skills that should help him cope better to maintain sobriety/mood stability after discharge. [name] has completed detoxification treatment & his mood is now stable. This is evidenced by his reports of improved mood, absence of suicidal ideations & substance withdrawal symptoms. He is currently being discharged with a referral & an appointment to the [facility] treatment program as noted below. He is provided with all the necessary information needed to make this appointments without problems.  Upon discharge, [name] adamantly denies any SIHI, AVH, delusional thoughts or paranoia. He left [facility] with all personal belongings in no apparent distress. Transportation per his arrangement.  Physical Findings: AIMS: Facial and Oral Movements Muscles of Facial Expression: None, normal Lips and Perioral Area: None, normal Jaw: None, normal Tongue: None, normal,Extremity  Movements Upper (arms, wrists, hands, fingers): None, normal Lower (legs, knees, ankles, toes): None, normal, Trunk Movements Neck, shoulders, hips: None, normal, Overall Severity Severity of abnormal movements (highest score from questions above): None, normal Incapacitation due to abnormal movements: None, normal Patient's awareness of abnormal movements (rate only patient's report): No Awareness, Dental Status Current problems with teeth and/or dentures?: No Does patient usually wear dentures?: No  CIWA:  CIWA-Ar Total: 4 COWS:  COWS Total Score: 2  Musculoskeletal: Strength & Muscle Tone: within normal limits Gait & Station: normal Patient leans: N/A  Psychiatric Specialty Exam: Physical Exam  Constitutional: He is oriented to person, place, and time. He appears well-developed.  HENT:  Head: Normocephalic.  Eyes: Pupils are equal, round, and reactive to light.  Neck: Normal range of motion.  Cardiovascular: Normal rate.   Respiratory: Effort normal.  GI: Soft.  Genitourinary:  Genitourinary Comments: Deferred  Musculoskeletal: Normal range of motion.  Neurological: He is alert and oriented to person, place, and time.  Skin: Skin is warm and dry.    Review of Systems  Constitutional: Negative.   HENT: Negative.   Eyes: Negative.   Respiratory: Negative.   Cardiovascular: Negative.   Gastrointestinal:  Negative.   Genitourinary: Negative.   Musculoskeletal: Negative.   Skin: Negative.   Neurological: Negative.   Endo/Heme/Allergies: Negative.   Psychiatric/Behavioral: Positive for depression (Stable), hallucinations (Hx. Psychosis) and substance abuse (Hx. Polysubstance use disorder). Negative for memory loss and suicidal ideas. The patient has insomnia (Stable). The patient is not nervous/anxious.     Blood pressure 126/67, pulse 64, temperature 98.2 F (36.8 C), temperature source Oral, resp. rate 16, height 5\' 10"  (1.778 m), weight 85.7 kg (189 lb), SpO2 97 %.Body  mass index is 27.12 kg/m.  See Md's SRA   Have you used any form of tobacco in the last 30 days? (Cigarettes, Smokeless Tobacco, Cigars, and/or Pipes): Yes  Has this patient used any form of tobacco in the last 30 days? (Cigarettes, Smokeless Tobacco, Cigars, and/or Pipes): Yes, provided with nicorette gum prescription.  Blood Alcohol level:  Lab Results  Component Value Date   ETH <5 06/15/2017   ETH 60 (H) 11/27/2016   Metabolic Disorder Labs:  Lab Results  Component Value Date   HGBA1C 5.7 (H) 06/18/2017   MPG 116.89 06/18/2017   MPG 120 12/03/2016   No results found for: PROLACTIN Lab Results  Component Value Date   CHOL 165 06/18/2017   TRIG 241 (H) 06/18/2017   HDL 41 06/18/2017   CHOLHDL 4.0 06/18/2017   VLDL 48 (H) 06/18/2017   LDLCALC 76 06/18/2017   See Psychiatric Specialty Exam and Suicide Risk Assessment completed by Attending Physician prior to discharge.  Discharge destination:  Home  Is patient on multiple antipsychotic therapies at discharge:  No   Has Patient had three or more failed trials of antipsychotic monotherapy by history:  No  Recommended Plan for Multiple Antipsychotic Therapies: NA  Allergies as of 06/22/2017      Reactions   Lithium Anaphylaxis   Penicillins Rash   Has patient had a PCN reaction causing immediate rash, facial/tongue/throat swelling, SOB or lightheadedness with hypotension: Yes Has patient had a PCN reaction causing severe rash involving mucus membranes or skin necrosis: No Has patient had a PCN reaction that required hospitalization No Has patient had a PCN reaction occurring within the last 10 years: No If all of the above answers are "NO", then may proceed with Cephalosporin use.      Medication List    STOP taking these medications   buPROPion 150 MG 24 hr tablet Commonly known as:  WELLBUTRIN XL   cephALEXin 500 MG capsule Commonly known as:  KEFLEX   nicotine polacrilex 2 MG gum Commonly known as:   NICORETTE   pantoprazole 40 MG tablet Commonly known as:  PROTONIX   sertraline 100 MG tablet Commonly known as:  ZOLOFT   traZODone 100 MG tablet Commonly known as:  DESYREL     TAKE these medications     Indication  FLUoxetine 40 MG capsule Commonly known as:  PROZAC Take 1 capsule (40 mg total) by mouth daily. For depression  Indication:  Major Depressive Disorder   gabapentin 400 MG capsule Commonly known as:  NEURONTIN Take 2 capsules (800 mg total) by mouth 3 (three) times daily. For agitation What changed:  when to take this  additional instructions  Another medication with the same name was removed. Continue taking this medication, and follow the directions you see here.  Indication:  Agitation   hydrOXYzine 25 MG tablet Commonly known as:  ATARAX/VISTARIL Take 1 tablet (25 mg total) by mouth every 6 (six) hours as needed for anxiety. What  changed:  medication strength  how much to take  when to take this  Indication:  Feeling Anxious   multivitamin with minerals Tabs tablet Take 1 tablet by mouth daily. For low Vitamin What changed:  additional instructions  Indication:  Vitamin supplement   prazosin 2 MG capsule Commonly known as:  MINIPRESS Take 2 capsules (4 mg total) by mouth at bedtime. For nightmares What changed:  medication strength  how much to take  Indication:  Nightmares   QUEtiapine 300 MG tablet Commonly known as:  SEROQUEL Take 1 tablet (300 mg total) by mouth at bedtime. For mood control What changed:  medication strength  how much to take  additional instructions  Another medication with the same name was removed. Continue taking this medication, and follow the directions you see here.  Indication:  Mood control   thiamine 100 MG tablet Take 1 tablet (100 mg total) by mouth daily. For thiamine replacement What changed:  additional instructions  Indication:  Deficiency in Thiamine or Vitamin B1      Follow-up  Information    [facility] Follow up on 06/25/2017.   Why:  Please go to facility on 9/4 at 12 noon or later; you will see MD and a therapist at that time.  Bring hospital discharge paperwork, photo ID, and any insurance card to this appointment.   Contact information: [address] [place] [address] [phone]          Follow-up recommendations: Activity:  As tolerated Diet: As recommended by your primary care doctor. Keep all scheduled follow-up appointments as recommended.   Comments: Patient is instructed prior to discharge to: Take all medications as prescribed by his/her mental healthcare provider. Report any adverse effects and or reactions from the medicines to his/her outpatient provider promptly. Patient has been instructed & cautioned: To not engage in alcohol and or illegal drug use while on prescription medicines. In the event of worsening symptoms, patient is instructed to call the crisis hotline, 911 and or go to the nearest ED for appropriate evaluation and treatment of symptoms. To follow-up with his/her primary care provider for your other medical issues, concerns and or health care needs.   Signed: Sanjuana Kava, NP, PMHNP, FNP-BC 06/22/2017, 9:21 AM

## 2017-06-22 NOTE — [facility] Suicide Risk Assessment (Signed)
[facility]   Principal Problem: Major depressive disorder, recurrent severe without psychotic features [facility](HCC) Discharge Diagnoses:  Patient Active Problem List   Diagnosis Date Noted  . Alcohol use disorder, severe, dependence (HCC) [F10.20]   . Major depressive disorder, recurrent severe without psychotic features (HCC) [F33.2] 12/03/2016  . ETOH abuse [F10.10]   . Cirrhosis (HCC) [K74.60] 05/24/2015  . Suicidal ideation [R45.851]   . Recurrent cellulitis of lower leg [L03.119] 11/09/2014  . Venous (peripheral) insufficiency [I87.2] 11/09/2014  . Bipolar affective disorder (HCC) [F31.9]   . Alcohol dependence with alcohol-induced mood disorder (HCC) [F10.24]   . Opioid dependence with opioid-induced mood disorder (HCC) [F11.24]   . Severe recurrent major depression without psychotic features (HCC) [F33.2] 09/09/2014  . MDD (major depressive disorder) [F32.9] 09/09/2014  . Suicidal ideations [R45.851]   . Chronic pain syndrome [G89.4] 05/31/2014  . Tobacco dependence [F17.200] 05/30/2014  . Poor dentition [K08.9] 02/03/2014  . Hepatitis C virus infection without hepatic coma [B19.20] 02/03/2014  . Sepsis (HCC) [A41.9] 01/29/2014  . Anxiety state, unspecified [F41.1] 01/31/2013  . Mood disorder in conditions classified elsewhere [F06.30] 01/31/2013  . 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 Alcohol withdrawal (HCC) [F10.239] 01/27/2013    Class: Acute    Total Time spent with patient: 45 minutes  Musculoskeletal: Strength & Muscle Tone: within normal limits Gait & Station: normal Patient leans: N/A  Psychiatric Specialty Exam: Review of Systems  Constitutional: Negative.   HENT: Negative.   Eyes: Negative.   Respiratory: Negative.   Cardiovascular: Negative.   Gastrointestinal: Negative.   Genitourinary: Negative.   Musculoskeletal: Negative.   Neurological:  Negative.   Endo/Heme/Allergies: Negative.   Psychiatric/Behavioral: Negative for depression, memory loss and suicidal ideas. The patient is not nervous/anxious and does not have insomnia.     Blood pressure 126/67, pulse 64, temperature 98.2 F (36.8 C), temperature source Oral, resp. rate 16, height 5\' 10"  (1.778 m), weight 85.7 kg (189 lb), SpO2 97 %.Body mass index is 27.12 kg/m.  General Appearance:  Casually dressed, not in any distress. Polite calm and cooperative. Appropriate behavior. Not internally distracted.  Eye Contact::  Good  Speech:  Spontaneous, normal prosody. Normal tone and rate.   Volume:  Normal  Mood:  Euthymic  Affect:  Appropriate and Restricted  Thought Process:  Linear  Orientation:  Full (Time, Place, and Person)  Thought Content:  No delusional theme. No preoccupation with violent thoughts. No negative ruminations. No obsession.  No hallucination in any modality.   Suicidal Thoughts:  No  Homicidal Thoughts:  No  Memory:  WNL  Judgement:  Good  Insight:  Good  Psychomotor Activity:  Normal  Concentration:  Good  Recall:  Good  Fund of Knowledge:Good  Language: Good  Akathisia:  Negative  Handed:    AIMS (if indicated):     Assets:  Desire for Improvement Housing Physical Health Resilience Social Support  Sleep:  Number of Hours: 6.75  Cognition: WNL  ADL's:  Intact   Clinical Assessment::   41 y.o Caucasian male, single,currently homeless. Background history of SUD, Mood Disorder. Self presented to the hospital requesting detox. Relapsed four months ago. Has been drinking regularly, has been using opiates mainly. Reports auditory hallucinations telling him to jump off the bridge. UDS is positive for opiates, THC and amphetamine. BAL was <5.  Seen today. States that he slept well last night. No nightmares. He feels good  this morning. No side effects from recent medication adjustment. He has structured his day already. He plans to attend AA meeting  later today. Says he plans to get a new phone. That way he would stay away from friends that would make him relapse. Some anxiety on how he would accomplish all this. However optimistic and positive minded.  Reports that he is in good spirits. Not feeling depressed. He is able to think clearly. He is able to focus on task. His thoughts are not crowded or racing. No evidence of mania. No hallucination in any modality. He is not making any delusional statement. No passivity of will/thought. He is fully in touch with reality. No thoughts of suicide. No thoughts of homicide. No violent thoughts. No overwhelming anxiety. No craving for substances. No access to weapons.   Nursing staff notes that irritability is less. He has been interacting with peers. He has not voiced any suicidal or homicidal thoughts.   Patient was discussed at team. Team members feels that patient is back to his baseline level of function. Team agrees with plan to discharge patient today.   Demographic Factors:  Male  Loss Factors: NA  Historical Factors: Prior suicide attempts and Impulsivity  Risk Reduction Factors:   Living with another person, especially a relative, Positive social support, Positive therapeutic relationship and Positive coping skills or problem solving skills  Continued Clinical Symptoms:  As above  Cognitive Features That Contribute To Risk:  None    Suicide Risk:  Minimal: No identifiable suicidal ideation.  Patient is not having any thoughts of suicide at this time. Modifiable risk factors targeted during this admission includes depression, chronic pain and substance use. Demographical and historical risk factors cannot be modified. Patient is now engaging well. Patient is reliable and is future oriented. We have buffered patient's support structures. At this point, patient is at low risk of suicide. Patient is aware of the effects of psychoactive substances on decision making process. Patient has  been provided with emergency contacts. Patient acknowledges to use resources provided if unforseen circumstances changes their current risk stratification.    Follow-up Information    [facility] Follow up on 06/25/2017.   Why:  Please go to facility on 9/4 at 12 noon or later; you will see MD and a therapist at that time.  Bring hospital discharge paperwork, photo ID, and any insurance card to this appointment.   Contact information: [address] [place] [address] [phone]           Plan Of Care/Follow-up recommendations:  1. Continue current psychotropic medications 2. Mental health and addiction follow up as arranged.  3. . Provided limited quantity of prescriptions   Georgiann Cocker, MD 06/22/2017, 9:37 AM

## 2017-06-22 NOTE — Plan of Care (Signed)
Problem: Safety: Goal: Periods of time without injury will increase Outcome: Progressing Pt is safe on the unit. Pt denies SI/HI

## 2017-06-22 NOTE — Progress Notes (Signed)
Patient ID: [name] PennerGary M [name], male   DOB: October 27, 1975, 41 y.o.   MRN: [id number]  D: Patient observed watching TV and interacting well with peers on approach. Pt mood/affect appeared depressed and  anxious. Pt attended evening AA group and engaged in discussions. Denies SI/HI/AVH and pain. A: Support and encouragement offered as needed. Medications administered as prescribed.  R: Patient is safe and cooperative on unit. Will continue to monitor  for safety and stability.

## 2017-06-22 NOTE — Progress Notes (Signed)
Data. Patient denies SI/HI/AVH. Verbally contracts for safety on the unit and to come to staff before acting of any self harm thoughts/feelings/voices.  Patient interacting well with staff and other patients.  Action. Emotional support and encouragement offered. Education provided on medication, indications and side effect. Q 15 minute checks done for safety. Response. Safety on the unit maintained through 15 minute checks.  Medications taken as prescribed. Attended groups. Remained calm and appropriate through out shift.  Pt. discharged to lobby.  Belongings sheet reviewed and signed by pt. and all belongings, including medication scripts,  sent home. Paperwork reviewed and pt. able to verbalize understanding of education. Pt. in no current distress and ambulatory.

## 2017-07-01 ENCOUNTER — Ambulatory Visit (INDEPENDENT_AMBULATORY_CARE_PROVIDER_SITE_OTHER): Payer: Medicare Other | Admitting: Family Medicine

## 2017-07-01 ENCOUNTER — Encounter: Payer: Self-pay | Admitting: Family Medicine

## 2017-07-01 VITALS — BP 136/68 | HR 74 | Temp 98.1°F | Resp 16 | Ht 70.0 in | Wt 231.0 lb

## 2017-07-01 DIAGNOSIS — L03116 Cellulitis of left lower limb: Secondary | ICD-10-CM | POA: Diagnosis not present

## 2017-07-01 DIAGNOSIS — Z1389 Encounter for screening for other disorder: Secondary | ICD-10-CM | POA: Diagnosis not present

## 2017-07-01 DIAGNOSIS — F339 Major depressive disorder, recurrent, unspecified: Secondary | ICD-10-CM

## 2017-07-01 DIAGNOSIS — Z8619 Personal history of other infectious and parasitic diseases: Secondary | ICD-10-CM

## 2017-07-01 LAB — POCT URINALYSIS DIP (DEVICE)
Bilirubin Urine: NEGATIVE
GLUCOSE, UA: NEGATIVE mg/dL
Hgb urine dipstick: NEGATIVE
Ketones, ur: NEGATIVE mg/dL
LEUKOCYTES UA: NEGATIVE
NITRITE: NEGATIVE
PROTEIN: NEGATIVE mg/dL
Specific Gravity, Urine: 1.025 (ref 1.005–1.030)
UROBILINOGEN UA: 0.2 mg/dL (ref 0.0–1.0)
pH: 6.5 (ref 5.0–8.0)

## 2017-07-01 MED ORDER — CEFTRIAXONE SODIUM 500 MG IJ SOLR
1000.0000 mg | Freq: Once | INTRAMUSCULAR | Status: AC
  Administered 2017-07-01: 1000 mg via INTRAMUSCULAR

## 2017-07-01 MED ORDER — POTASSIUM CHLORIDE CRYS ER 20 MEQ PO TBCR: 20.0000 meq | EXTENDED_RELEASE_TABLET | Freq: Every day | ORAL | 0 refills | Status: DC

## 2017-07-01 MED ORDER — DOXYCYCLINE HYCLATE 100 MG PO CAPS: 100.0000 mg | ORAL_CAPSULE | Freq: Two times a day (BID) | ORAL | 0 refills | Status: AC

## 2017-07-01 MED ORDER — MUPIROCIN 2 % EX OINT: 1.0000 "application " | TOPICAL_OINTMENT | Freq: Three times a day (TID) | CUTANEOUS | 0 refills | Status: DC

## 2017-07-01 MED ORDER — FUROSEMIDE 40 MG PO TABS: 40.0000 mg | ORAL_TABLET | Freq: Every day | ORAL | 0 refills | Status: DC

## 2017-07-01 NOTE — Patient Instructions (Addendum)
I am prescribing you Doxycyline 100 mg twice daily for 21 days. I am furosemide 40 mg once daily in morning for let swelling. Potassium 10 MEQ once daily with dose of furosemide to prevent electrolyte dysfunction.      Cellulitis, Adult Cellulitis is a skin infection. The infected area is usually red and sore. This condition occurs most often in the arms and lower legs. It is very important to get treated for this condition. Follow these instructions at home:  Take over-the-counter and prescription medicines only as told by your doctor.  If you were prescribed an antibiotic medicine, take it as told by your doctor. Do not stop taking the antibiotic even if you start to feel better.  Drink enough fluid to keep your pee (urine) clear or pale yellow.  Do not touch or rub the infected area.  Raise (elevate) the infected area above the level of your heart while you are sitting or lying down.  Place warm or cold wet cloths (warm or cold compresses) on the infected area. Do this as told by your doctor.  Keep all follow-up visits as told by your doctor. This is important. These visits let your doctor make sure your infection is not getting worse. Contact a doctor if:  You have a fever.  Your symptoms do not get better after 1-2 days of treatment.  Your bone or joint under the infected area starts to hurt after the skin has healed.  Your infection comes back. This can happen in the same area or another area.  You have a swollen bump in the infected area.  You have new symptoms.  You feel ill and also have muscle aches and pains. Get help right away if:  Your symptoms get worse.  You feel very sleepy.  You throw up (vomit) or have watery poop (diarrhea) for a long time.  There are red streaks coming from the infected area.  Your red area gets larger.  Your red area turns darker. This information is not intended to replace advice given to you by your health care provider.  Make sure you discuss any questions you have with your health care provider. Document Released: 03/26/2008 Document Revised: 03/15/2016 Document Reviewed: 08/17/2015 Elsevier Interactive Patient Education  2018 [employer]

## 2017-07-01 NOTE — Progress Notes (Signed)
Patient ID: [name] PennerGary M [name], male    DOB: 1976-06-07, 41 y.o.   MRN: [id number]  PCP: Bing NeighborsHarris, Lesley Galentine S, FNP  Chief Complaint  Patient presents with  . Establish Care  . Hospitalization Follow-up    Subjective:  HPI [name] PennerGary M [name] is a 41 y.o. male with history of hepatitis C and recurrent cellulitis of lower extremities, presents to establish care and hospitalization follow-up. [name] [name] was recently hospitalized in behavioral health for severe major depression and ETOH induced mood disorder. He [name] a history substance abuse, however he is currently in a drug rehabilitation and mental health program , [facility], [address] [name] [name] experienced recurrent cellulitis infections of left leg. [facility] and treated for cellulitis infection with a oral cephalexin 500 mg x 4 time daily duration unknown. He reports inconsistently taking medication as he was recently hospitalized and he felt antibiotics wasn't clearing infection. He attributes repeated infections to being homeless and sleeping outside in contact with insects. He now [name] housing at a half-way house. Today he complains of generalized body aches, pain is most pronounced in left lower leg. [name] [name] a history of hepatitis C and underwent treatment with [facility] 2016, while a patient at infectious disease however, he never followed through with follow-up after completion of treatment and requests a referral back to infectious disease.  Social History   Social History  . Marital status: Divorced    Spouse name: N/A  . Number of children: N/A  . Years of education: N/A   Occupational History  . Not on file.   Social History Main Topics  . Smoking status: Current Every Day Smoker    Packs/day: 1.00    Types: Cigarettes    Start date: 10/22/1981  . Smokeless tobacco: Never Used  . Alcohol use 7.2 oz/week    12 Cans of beer per week     Comment: heavy  . Drug use: No     Comment: denies at  this time  . Sexual activity: Not Currently   Other Topics Concern  . Not on file   Social History Narrative  . No narrative on file    Family History  Problem Relation Age of Onset  . Alcohol abuse Father   . Cancer Maternal Aunt    Review of Systems See HPI Patient Active Problem List   Diagnosis Date Noted  . Alcohol use disorder, severe, dependence (HCC)   . 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 ETOH abuse   . Cirrhosis (HCC) 05/24/2015  . Suicidal ideation   . Recurrent cellulitis of lower leg 11/09/2014  . Venous (peripheral) insufficiency 11/09/2014  . Bipolar affective disorder (HCC)   . Alcohol dependence with alcohol-induced mood disorder (HCC)   . Opioid dependence with opioid-induced mood disorder (HCC)   . Severe recurrent major depression without psychotic features (HCC) 09/09/2014  . MDD (major depressive disorder) 09/09/2014  . Suicidal ideations   . Chronic pain syndrome 05/31/2014  . Tobacco dependence 05/30/2014  . Poor dentition 02/03/2014  . Hepatitis C virus infection without hepatic coma 02/03/2014  . Sepsis (HCC) 01/29/2014  . Anxiety state, unspecified 01/31/2013  . Mood disorder in conditions classified elsewhere 01/31/2013  . Polysubstance (including opioids) dependence with physiological dependence (HCC) 01/27/2013    Class: Acute  . Alcohol withdrawal (HCC) 01/27/2013    Class: Acute    Prior to Admission medications   Medication  Sig Start Date End Date Taking? Authorizing Provider  Buprenorphine HCl-Naloxone HCl (SUBOXONE) 8-2 MG FILM Place under the tongue.   Yes [provider]  clonazePAM (KLONOPIN) 1 MG tablet Take 1 mg by mouth 2 (two) times daily.   Yes [provider]  FLUoxetine (PROZAC) 40 MG capsule Take 1 capsule (40 mg total) by mouth daily. For depression 06/23/17  Yes Nwoko, Nicole Kindred I, NP  gabapentin (NEURONTIN) 400 MG capsule Take 2 capsules (800 mg total)  by mouth 3 (three) times daily. For agitation 06/22/17  Yes Armandina Stammer I, NP  Multiple Vitamin (MULTIVITAMIN WITH MINERALS) TABS tablet Take 1 tablet by mouth daily. For low Vitamin 06/22/17  Yes Nwoko, Nicole Kindred I, NP  prazosin (MINIPRESS) 2 MG capsule Take 2 capsules (4 mg total) by mouth at bedtime. For nightmares 06/22/17  Yes Armandina Stammer I, NP  QUEtiapine (SEROQUEL) 300 MG tablet Take 1 tablet (300 mg total) by mouth at bedtime. For mood control 06/22/17  Yes Armandina Stammer I, NP  thiamine 100 MG tablet Take 1 tablet (100 mg total) by mouth daily. For thiamine replacement 06/23/17  Yes Armandina Stammer I, NP  hydrOXYzine (ATARAX/VISTARIL) 25 MG tablet Take 1 tablet (25 mg total) by mouth every 6 (six) hours as needed for anxiety. Patient not taking: Reported on 07/01/2017 06/22/17   Sanjuana Kava, NP   Past Medical, Surgical Family and Social History reviewed and updated.   Objective:   Today's Vitals   07/01/17 0803  BP: 136/68  Pulse: 74  Resp: 16  Temp: 98.1 F (36.7 C)  TempSrc: Oral  SpO2: 96%  Weight: 231 lb (104.8 kg)  Height:  (1.778 m)    Wt Readings from Last 3 Encounters:  07/01/17 231 lb (104.8 kg)  06/15/17 203 lb (92.1 kg)  05/24/15 236 lb (107 kg)   Physical Exam  Constitutional: He is oriented to person, place, and time. He appears well-developed and well-nourished. He does not appear ill. No distress.  HENT:  Head: Normocephalic and atraumatic.  Eyes: Pupils are equal, round, and reactive to light. Conjunctivae and EOM are normal.  Neck: Normal range of motion. Neck supple.  Cardiovascular: Normal rate, regular rhythm, normal heart sounds and intact distal pulses.   Pulmonary/Chest: Effort normal and breath sounds normal.  Musculoskeletal: Normal range of motion.  Neurological: He is alert and oriented to person, place, and time.  Skin: Skin is warm. There is erythema.     Psychiatric: He [name] a normal mood and affect. His behavior is normal. Judgment and thought  content normal.     Assessment & Plan:  1. Cellulitis of left lower extremity,  - CBC with Differential - cefTRIAXone (ROCEPHIN) injection 1,000 mg; Inject 1,000 mg into the muscle once. (patient reports he [name] tolerated rocephin in the past without reaction)   2. Hx of hepatitis C-ambulatory referral to infectious disease    3. Recurrent major depressive disorder, remission status unspecified (HCC) -continue current [facility] outpatient treatment and medication regimen   . furosemide (LASIX) 40 MG tablet    Sig: Take 1 tablet (40 mg total) by mouth daily.    Dispense:  10 tablet    Refill:  0    Order Specific Question:   Supervising Provider    Answer:   Quentin Angst [id number]  . potassium chloride SA (K-DUR,KLOR-CON) 20 MEQ tablet    Sig: Take 1 tablet (20 mEq total) by mouth daily.    Dispense:  10 tablet  Refill:  0    Order Specific Question:   Supervising Provider    Answer:   Quentin Angst [id number]  . doxycycline (VIBRAMYCIN) 100 MG capsule    Sig: Take 1 capsule (100 mg total) by mouth 2 (two) times daily.    Dispense:  42 capsule (3 weeks)    Refill:  0    Order Specific Question:   Supervising Provider    Answer:   Quentin Angst [id number]  . mupirocin ointment (BACTROBAN) 2 %    Sig: Apply 1 application topically 3 (three) times daily.    Dispense:  90 g    Refill:  0    Order Specific Question:   Supervising Provider    Answer:   Quentin Angst [id number]    RTC: 6 weeks for evaluation of lower leg infection   Godfrey Pick. Tiburcio Pea, MSN, FNP-C [facility]  [address] [place], [place] [address] [phone]

## 2017-07-02 LAB — CBC WITH DIFFERENTIAL/PLATELET
BASOS PCT: 0.7 %
Basophils Absolute: 60 cells/uL (ref 0–200)
EOS PCT: 6 %
Eosinophils Absolute: 510 cells/uL — ABNORMAL HIGH (ref 15–500)
HCT: 40.6 % (ref 38.5–50.0)
Hemoglobin: 13.6 g/dL (ref 13.2–17.1)
Lymphs Abs: 2695 cells/uL (ref 850–3900)
MCH: 31.3 pg (ref 27.0–33.0)
MCHC: 33.5 g/dL (ref 32.0–36.0)
MCV: 93.3 fL (ref 80.0–100.0)
MONOS PCT: 12.7 %
MPV: 11.1 fL (ref 7.5–12.5)
NEUTROS ABS: 4157 {cells}/uL (ref 1500–7800)
Neutrophils Relative %: 48.9 %
PLATELETS: 302 10*3/uL (ref 140–400)
RBC: 4.35 10*6/uL (ref 4.20–5.80)
RDW: 13.6 % (ref 11.0–15.0)
TOTAL LYMPHOCYTE: 31.7 %
WBC mixed population: 1080 cells/uL — ABNORMAL HIGH (ref 200–950)
WBC: 8.5 10*3/uL (ref 3.8–10.8)

## 2017-07-02 LAB — TIQ-NTM

## 2017-07-18 ENCOUNTER — Other Ambulatory Visit: Payer: Self-pay | Admitting: Family Medicine

## 2017-07-22 ENCOUNTER — Telehealth: Payer: Self-pay | Admitting: Pharmacist Clinician (PhC)/ Clinical Pharmacy Specialist

## 2017-07-22 NOTE — Telephone Encounter (Signed)
[name] [name] brought in some paperwork to see if we can assess [name] [name] for hep C. He was previously seen by Dr. Comer>>started on [place] but never f/u for the cure lab or visit. We'll need to bring [name] [name] back. [name] [name] will call to make an appt. He is homeless.

## 2017-07-25 ENCOUNTER — Telehealth: Payer: Self-pay

## 2017-07-25 NOTE — Telephone Encounter (Signed)
Pharmacy contacted and made aware that medication will not be filled

## 2017-07-25 NOTE — Telephone Encounter (Signed)
Pharmacy requesting  A refill on Doxycycline Hyclate  capsules

## 2017-07-25 NOTE — Telephone Encounter (Signed)
This is a second request and again I am denying medication. I do not refill antibiotics and patient missed follow-up. Schedule him in office for follow-up appointment.  Godfrey Pick. Tiburcio Pea, MSN, FNP-C [facility]  [address], [place] [address] [phone]

## 2017-07-29 ENCOUNTER — Ambulatory Visit: Payer: Self-pay | Admitting: Family Medicine

## 2017-12-25 ENCOUNTER — Emergency Department (HOSPITAL_COMMUNITY)
Admission: EM | Admit: 2017-12-25 | Discharge: 2017-12-26 | Disposition: A | Discharge status: Home / Self Care | Payer: Medicare Other | Source: Home / Self Care | Attending: Emergency Medicine | Admitting: Emergency Medicine

## 2017-12-25 ENCOUNTER — Encounter (HOSPITAL_COMMUNITY): Payer: Self-pay

## 2017-12-25 ENCOUNTER — Encounter (HOSPITAL_COMMUNITY): Payer: Self-pay | Admitting: Nurse Practitioner

## 2017-12-25 ENCOUNTER — Emergency Department (HOSPITAL_COMMUNITY)
Admission: EM | Admit: 2017-12-25 | Discharge: 2017-12-25 | Disposition: A | Discharge status: Home / Self Care | Payer: Medicare Other | Attending: Emergency Medicine | Admitting: Emergency Medicine

## 2017-12-25 ENCOUNTER — Ambulatory Visit (HOSPITAL_COMMUNITY)
Admission: RE | Admit: 2017-12-25 | Discharge: 2017-12-25 | Disposition: A | Discharge status: Home / Self Care | Payer: Medicare Other | Attending: Psychiatry | Admitting: Psychiatry

## 2017-12-25 ENCOUNTER — Other Ambulatory Visit: Payer: Self-pay

## 2017-12-25 ENCOUNTER — Emergency Department (HOSPITAL_COMMUNITY)
Admission: EM | Admit: 2017-12-25 | Discharge: 2017-12-25 | Discharge status: Against Medical Advice | Payer: Medicare Other | Source: Home / Self Care | Attending: Emergency Medicine | Admitting: Emergency Medicine

## 2017-12-25 DIAGNOSIS — F329 Major depressive disorder, single episode, unspecified: Secondary | ICD-10-CM | POA: Diagnosis present

## 2017-12-25 DIAGNOSIS — F1494 Cocaine use, unspecified with cocaine-induced mood disorder: Secondary | ICD-10-CM | POA: Insufficient documentation

## 2017-12-25 DIAGNOSIS — F99 Mental disorder, not otherwise specified: Secondary | ICD-10-CM | POA: Diagnosis present

## 2017-12-25 DIAGNOSIS — F1120 Opioid dependence, uncomplicated: Secondary | ICD-10-CM | POA: Diagnosis not present

## 2017-12-25 DIAGNOSIS — Z79899 Other long term (current) drug therapy: Secondary | ICD-10-CM

## 2017-12-25 DIAGNOSIS — Z5321 Procedure and treatment not carried out due to patient leaving prior to being seen by health care provider: Secondary | ICD-10-CM

## 2017-12-25 DIAGNOSIS — F1020 Alcohol dependence, uncomplicated: Secondary | ICD-10-CM | POA: Diagnosis not present

## 2017-12-25 DIAGNOSIS — F1920 Other psychoactive substance dependence, uncomplicated: Secondary | ICD-10-CM | POA: Diagnosis not present

## 2017-12-25 DIAGNOSIS — F314 Bipolar disorder, current episode depressed, severe, without psychotic features: Secondary | ICD-10-CM | POA: Diagnosis not present

## 2017-12-25 DIAGNOSIS — R45851 Suicidal ideations: Secondary | ICD-10-CM | POA: Insufficient documentation

## 2017-12-25 DIAGNOSIS — F17210 Nicotine dependence, cigarettes, uncomplicated: Secondary | ICD-10-CM

## 2017-12-25 DIAGNOSIS — F419 Anxiety disorder, unspecified: Secondary | ICD-10-CM | POA: Diagnosis not present

## 2017-12-25 DIAGNOSIS — F1994 Other psychoactive substance use, unspecified with psychoactive substance-induced mood disorder: Secondary | ICD-10-CM

## 2017-12-25 DIAGNOSIS — R45850 Homicidal ideations: Secondary | ICD-10-CM | POA: Diagnosis not present

## 2017-12-25 DIAGNOSIS — R440 Auditory hallucinations: Secondary | ICD-10-CM | POA: Diagnosis not present

## 2017-12-25 HISTORY — DX: Other psychoactive substance abuse, uncomplicated: F19.10

## 2017-12-25 LAB — COMPREHENSIVE METABOLIC PANEL
ALBUMIN: 3.6 g/dL (ref 3.5–5.0)
ALT: 25 U/L (ref 17–63)
ALT: 24 U/L (ref 17–63)
ANION GAP: 9 (ref 5–15)
AST: 86 U/L — AB (ref 15–41)
AST: 78 U/L — AB (ref 15–41)
Albumin: 3.9 g/dL (ref 3.5–5.0)
Alkaline Phosphatase: 75 U/L (ref 38–126)
Alkaline Phosphatase: 70 U/L (ref 38–126)
Anion gap: 10 (ref 5–15)
BILIRUBIN TOTAL: 0.4 mg/dL (ref 0.3–1.2)
BUN: 22 mg/dL — AB (ref 6–20)
BUN: 20 mg/dL (ref 6–20)
CHLORIDE: 107 mmol/L (ref 101–111)
CO2: 26 mmol/L (ref 22–32)
CO2: 26 mmol/L (ref 22–32)
Calcium: 9.2 mg/dL (ref 8.9–10.3)
Calcium: 9.1 mg/dL (ref 8.9–10.3)
Chloride: 109 mmol/L (ref 101–111)
Creatinine, Ser: 0.87 mg/dL (ref 0.61–1.24)
Creatinine, Ser: 0.81 mg/dL (ref 0.61–1.24)
GFR calc Af Amer: >60 mL/min (ref >60)
GFR calc Af Amer: >60 mL/min (ref >60)
GFR calc non Af Amer: >60 mL/min (ref >60)
GFR calc non Af Amer: >60 mL/min (ref >60)
GLUCOSE: 78 mg/dL (ref 65–99)
GLUCOSE: 119 mg/dL — AB (ref 65–99)
POTASSIUM: 3.6 mmol/L (ref 3.5–5.1)
POTASSIUM: 3.6 mmol/L (ref 3.5–5.1)
SODIUM: 143 mmol/L (ref 135–145)
SODIUM: 144 mmol/L (ref 135–145)
TOTAL PROTEIN: 6.5 g/dL (ref 6.5–8.1)
Total Bilirubin: 0.8 mg/dL (ref 0.3–1.2)
Total Protein: 7.1 g/dL (ref 6.5–8.1)

## 2017-12-25 LAB — RAPID URINE DRUG SCREEN, HOSP PERFORMED
Amphetamines: NOT DETECTED
Amphetamines: NOT DETECTED
BARBITURATES: NOT DETECTED
BENZODIAZEPINES: POSITIVE — AB
Barbiturates: NOT DETECTED
Benzodiazepines: POSITIVE — AB
COCAINE: POSITIVE — AB
Cocaine: POSITIVE — AB
OPIATES: NOT DETECTED
OPIATES: NOT DETECTED
Tetrahydrocannabinol: POSITIVE — AB
Tetrahydrocannabinol: POSITIVE — AB

## 2017-12-25 LAB — CBC WITH DIFFERENTIAL/PLATELET
BASOS ABS: 0.1 10*3/uL (ref 0.0–0.1)
BASOS PCT: 0 %
EOS ABS: 0.5 10*3/uL (ref 0.0–0.7)
Eosinophils Relative: 4 %
HEMATOCRIT: 43.2 % (ref 39.0–52.0)
Hemoglobin: 14.1 g/dL (ref 13.0–17.0)
Lymphocytes Relative: 29 %
Lymphs Abs: 3.3 10*3/uL (ref 0.7–4.0)
MCH: 31.6 pg (ref 26.0–34.0)
MCHC: 32.6 g/dL (ref 30.0–36.0)
MCV: 96.9 fL (ref 78.0–100.0)
MONO ABS: 1.4 10*3/uL — AB (ref 0.1–1.0)
Monocytes Relative: 12 %
NEUTROS ABS: 6.3 10*3/uL (ref 1.7–7.7)
Neutrophils Relative %: 55 %
Platelets: 324 10*3/uL (ref 150–400)
RBC: 4.46 MIL/uL (ref 4.22–5.81)
RDW: 14.1 % (ref 11.5–15.5)
WBC: 11.4 10*3/uL — ABNORMAL HIGH (ref 4.0–10.5)

## 2017-12-25 LAB — ACETAMINOPHEN LEVEL: Acetaminophen (Tylenol), Serum: <10 ug/mL — ABNORMAL LOW (ref 10–30)

## 2017-12-25 LAB — URINALYSIS, ROUTINE W REFLEX MICROSCOPIC
BILIRUBIN URINE: NEGATIVE
Glucose, UA: NEGATIVE mg/dL
HGB URINE DIPSTICK: NEGATIVE
KETONES UR: 20 mg/dL — AB
LEUKOCYTES UA: NEGATIVE
Nitrite: NEGATIVE
PH: 5 (ref 5.0–8.0)
Protein, ur: 30 mg/dL — AB
Specific Gravity, Urine: 1.033 — ABNORMAL HIGH (ref 1.005–1.030)

## 2017-12-25 LAB — CBC
HCT: 42.3 % (ref 39.0–52.0)
Hemoglobin: 14.4 g/dL (ref 13.0–17.0)
MCH: 32.5 pg (ref 26.0–34.0)
MCHC: 34 g/dL (ref 30.0–36.0)
MCV: 95.5 fL (ref 78.0–100.0)
PLATELETS: 342 10*3/uL (ref 150–400)
RBC: 4.43 MIL/uL (ref 4.22–5.81)
RDW: 14.4 % (ref 11.5–15.5)
WBC: 9.9 10*3/uL (ref 4.0–10.5)

## 2017-12-25 LAB — SALICYLATE LEVEL

## 2017-12-25 LAB — TSH: TSH: 0.864 u[IU]/mL (ref 0.350–4.500)

## 2017-12-25 LAB — ETHANOL

## 2017-12-25 MED ORDER — VITAMIN B-1 100 MG PO TABS
100.0000 mg | ORAL_TABLET | Freq: Every day | ORAL | Status: DC
  Administered 2017-12-25: 100 mg via ORAL
  Filled 2017-12-25: qty 1

## 2017-12-25 MED ORDER — ONDANSETRON 4 MG PO TBDP
4.0000 mg | ORAL_TABLET | Freq: Once | ORAL | Status: AC
  Administered 2017-12-25: 4 mg via ORAL
  Filled 2017-12-25: qty 1

## 2017-12-25 MED ORDER — TRAZODONE HCL 100 MG PO TABS: 100.0000 mg | ORAL_TABLET | Freq: Every day | ORAL | Status: DC

## 2017-12-25 MED ORDER — HYDROXYZINE HCL 25 MG PO TABS
50.0000 mg | ORAL_TABLET | Freq: Three times a day (TID) | ORAL | Status: DC
  Administered 2017-12-25: 50 mg via ORAL
  Filled 2017-12-25: qty 2

## 2017-12-25 MED ORDER — QUETIAPINE FUMARATE 50 MG PO TABS: 50.0000 mg | ORAL_TABLET | Freq: Every day | ORAL | Status: DC

## 2017-12-25 MED ORDER — LORAZEPAM 1 MG PO TABS
1.0000 mg | ORAL_TABLET | Freq: Once | ORAL | Status: AC
Start: 2017-12-25 — End: 2017-12-25
  Administered 2017-12-25: 1 mg via ORAL
  Filled 2017-12-25: qty 1

## 2017-12-25 MED ORDER — GABAPENTIN 300 MG PO CAPS
300.0000 mg | ORAL_CAPSULE | Freq: Three times a day (TID) | ORAL | Status: DC
  Administered 2017-12-25: 300 mg via ORAL
  Filled 2017-12-25: qty 1

## 2017-12-25 NOTE — ED Provider Notes (Signed)
[facility]-EMERGENCY DEPT Provider Note   CSN: [id number] Arrival date & time: 12/25/17  1243     History   Chief Complaint No chief complaint on file.   HPI [name] PennerGary M Radford is a 42 y.o. male.  42 yo M with a cc of SI.  Going on for past month.  Patient has taken himself off of his mental health medications because he did not feel like they are working about 2 weeks ago.  Has tried to kill himself in the past.  Has tried the overdose on blood pressure medicines.  His current plan is to jump off a bridge.  He states he has picked out already.  Nothing is really changed in his life.  He denies any medical illnesses denies cough congestion fevers chills myalgias denies abdominal pain vomiting or diarrhea.  He states that he thinks he is withdrawing from drugs and alcohol currently.  Last drank yesterday.   The history is provided by the patient.  Illness  This is a new problem. The current episode started more than 1 week ago. The problem occurs constantly. The problem has been gradually worsening. Pertinent negatives include no chest pain, no abdominal pain, no headaches and no shortness of breath. Nothing aggravates the symptoms. Nothing relieves the symptoms. He has tried nothing for the symptoms. The treatment provided no relief.    Past Medical History:  Diagnosis Date  . Bipolar disorder (HCC)   . Cellulitis   . Depression   . Hepatitis C     Patient Active Problem List   Diagnosis Date Noted  . Alcohol use disorder, severe, dependence (HCC)   . 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 ETOH abuse   . Cirrhosis (HCC) 05/24/2015  . Suicidal ideation   . Recurrent cellulitis of lower leg 11/09/2014  . Venous (peripheral) insufficiency 11/09/2014  . Bipolar affective disorder (HCC)   . Alcohol dependence with alcohol-induced mood disorder (HCC)   . Opioid dependence with opioid-induced mood disorder (HCC)    . Severe recurrent major depression without psychotic features (HCC) 09/09/2014  . MDD (major depressive disorder) 09/09/2014  . Suicidal ideations   . Chronic pain syndrome 05/31/2014  . Tobacco dependence 05/30/2014  . Poor dentition 02/03/2014  . Hepatitis C virus infection without hepatic coma 02/03/2014  . Sepsis (HCC) 01/29/2014  . Anxiety state, unspecified 01/31/2013  . Mood disorder in conditions classified elsewhere 01/31/2013  . Polysubstance (including opioids) dependence with physiological dependence (HCC) 01/27/2013    Class: Acute  . Alcohol withdrawal (HCC) 01/27/2013    Class: Acute    Past Surgical History:  Procedure Laterality Date  . BACK SURGERY    . ROTATOR CUFF REPAIR    . SHOULDER SURGERY         Home Medications    Prior to Admission medications   Medication Sig Start Date End Date Taking? Authorizing Provider  Buprenorphine HCl-Naloxone HCl (SUBOXONE) 8-2 MG FILM Place under the tongue.    [provider]  clonazePAM (KLONOPIN) 1 MG tablet Take 1 mg by mouth 2 (two) times daily.    [provider]  FLUoxetine (PROZAC) 40 MG capsule Take 1 capsule (40 mg total) by mouth daily. For depression 06/23/17   Armandina StammerNwoko, Agnes I, NP  furosemide (LASIX) 40 MG tablet Take 1 tablet (40 mg total) by mouth daily. 07/01/17   Bing NeighborsHarris, Kimberly S, FNP  gabapentin (NEURONTIN) 400 MG capsule Take 2 capsules (800 mg total)  by mouth 3 (three) times daily. For agitation 06/22/17   Armandina Stammer I, NP  hydrOXYzine (ATARAX/VISTARIL) 25 MG tablet Take 1 tablet (25 mg total) by mouth every 6 (six) hours as needed for anxiety. Patient not taking: Reported on 07/01/2017 06/22/17   Armandina Stammer I, NP  Multiple Vitamin (MULTIVITAMIN WITH MINERALS) TABS tablet Take 1 tablet by mouth daily. For low Vitamin 06/22/17   Armandina Stammer I, NP  mupirocin ointment (BACTROBAN) 2 % Apply 1 application topically 3 (three) times daily. 07/01/17   Bing Neighbors, FNP  potassium chloride SA  (K-DUR,KLOR-CON) 20 MEQ tablet Take 1 tablet (20 mEq total) by mouth daily. 07/01/17   Bing Neighbors, FNP  prazosin (MINIPRESS) 2 MG capsule Take 2 capsules (4 mg total) by mouth at bedtime. For nightmares 06/22/17   Armandina Stammer I, NP  QUEtiapine (SEROQUEL) 300 MG tablet Take 1 tablet (300 mg total) by mouth at bedtime. For mood control 06/22/17   Armandina Stammer I, NP  thiamine 100 MG tablet Take 1 tablet (100 mg total) by mouth daily. For thiamine replacement 06/23/17   Sanjuana Kava, NP    Family History Family History  Problem Relation Age of Onset  . Alcohol abuse Father   . Cancer Maternal Aunt     Social History Social History   Tobacco Use  . Smoking status: Current Every Day Smoker    Packs/day: 1.00    Types: Cigarettes    Start date: 10/22/1981  . Smokeless tobacco: Never Used  Substance Use Topics  . Alcohol use: Yes    Alcohol/week: 7.2 oz    Types: 12 Cans of beer per week    Comment: heavy  . Drug use: No    Comment: denies at this time     Allergies   Lithium and Penicillins   Review of Systems Review of Systems  Constitutional: Negative for chills and fever.  HENT: Negative for congestion and facial swelling.   Eyes: Negative for discharge and visual disturbance.  Respiratory: Negative for shortness of breath.   Cardiovascular: Negative for chest pain and palpitations.  Gastrointestinal: Negative for abdominal pain, diarrhea and vomiting.  Musculoskeletal: Negative for arthralgias and myalgias.  Skin: Negative for color change and rash.  Neurological: Negative for tremors, syncope and headaches.  Psychiatric/Behavioral: Positive for suicidal ideas. Negative for confusion and dysphoric mood.     Physical Exam Updated Vital Signs There were no vitals taken for this visit.  Physical Exam  Constitutional: He is oriented to person, place, and time. He appears well-developed and well-nourished.  HENT:  Head: Normocephalic and atraumatic.  Eyes: EOM are  normal. Pupils are equal, round, and reactive to light.  Neck: Normal range of motion. Neck supple. No JVD present.  Cardiovascular: Normal rate and regular rhythm. Exam reveals no gallop and no friction rub.  No murmur heard. Pulmonary/Chest: No respiratory distress. He has no wheezes.  Abdominal: He exhibits no distension and no mass. There is no tenderness. There is no rebound and no guarding.  Musculoskeletal: Normal range of motion.  Neurological: He is alert and oriented to person, place, and time.  Skin: No rash noted. No pallor.  Psychiatric: He has a normal mood and affect. His behavior is normal.  Nursing note and vitals reviewed.    ED Treatments / Results  Labs (all labs ordered are listed, but only abnormal results are displayed) Labs Reviewed - No data to display  EKG  EKG Interpretation None  Radiology No results found.  Procedures Procedures (including critical care time)  Medications Ordered in ED Medications - No data to display   Initial Impression / Assessment and Plan / ED Course  I have reviewed the triage vital signs and the nursing notes.  Pertinent labs & imaging results that were available during my care of the patient were reviewed by me and considered in my medical decision making (see chart for details).     42 yo M with a chief complaint of suicidal ideation.  Patient is well-appearing and nontoxic on my exam.  He has no tachycardia or hypertension.  He has no tremors.  I doubt he is withdrawing at this time.  I feel he is medically clear for TTS evaluation.  The patients results and plan were reviewed and discussed.   Any x-rays performed were independently reviewed by myself.   Differential diagnosis were considered with the presenting HPI.  Medications - No data to display  There were no vitals filed for this visit.  Final diagnoses:  None   DDx:  Suicidal ideation.     Final Clinical Impressions(s) / ED Diagnoses    Final diagnoses:  None    ED Discharge Orders    None       Melene Plan, DO 12/25/17 1555

## 2017-12-25 NOTE — Progress Notes (Addendum)
Patient ID: [name] PennerGary M [name], male   DOB: June 11, 1976, 42 y.o.   MRN: [id number]  Pt was seen and chart reviewed with treatment. Pt's UDS positive for THC, benzos, and cocaine. Pt is being loud, aggressive, and verbally abusive to staff. Pt is yelling, calling staff RN a "bitch" and demanding librium and  Clonodine, BP is 111/56, pulse 60. Pt was offered gabapentin 300 mg and Vistaril 50 mg to which he started screaming at staff. Pt is discharged due to extremely aggressive behavior and being unsafe in [facility]. He is most likely drug seeking and does not appear at high risk to harm self at this time.    Laveda AbbeLaurie Britton Parks, NP-C 12-25-2017     1731  Patient's chart reviewed and case discussed with the physician extender and developed treatment plan. Reviewed the information documented and agree with the treatment plan.  Juanetta BeetsJacqueline Ismahan Lippman, DO 12/25/17 7:43 PM

## 2017-12-25 NOTE — ED Provider Notes (Signed)
Patient placed in Quick Look pathway, seen and evaluated   Chief Complaint: S/I  HPI:   42 y.o. male arrived via EMS stating he plans to jump off a bridge or run out in front of a car or go to his cousin's and get his gun and shoot himself. Patient has been trying to get off benzos, alcohol, cocaine and suboxone.   ROS: Psych: S/I  Physical Exam:   Gen: No distress patient talking and  requesting medication for nausea.   Neuro: Awake and Alert  Skin: Warm and dry  Psych: agitated, S/I     Focused Exam:    Ativan 1 mg PO  Zofran 4 mg ODT  Initiation of care has begun. The patient has been counseled on the process, plan, and necessity for staying for the completion/evaluation, and the remainder of the medical screening examination    Janne Napoleoneese, Sesilia Poucher M, NP 12/25/17 Barnie [id number]    Linwood DibblesKnapp, Jon, MD 12/26/17 1736

## 2017-12-25 NOTE — ED Notes (Signed)
Report given to [facility] RN 

## 2017-12-25 NOTE — ED Notes (Signed)
Pt became irate when he was presented with 300 mg of gabapentin. He said, "Damn, I can get 800 mg of that on the street. You tell that doctor that I need drugs; I need Librium and clonidine. I am detoxing." He called this Clinical research associatewriter a bitch and when this writer left his room to get more water for him, he started throwing things. Pt discharged to home and escorted by police and secuirty.
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
## 2017-12-25 NOTE — ED Triage Notes (Addendum)
Pt brought in by PD for SI with plan to jump off of a bridge or run out in front of a car or "go to my cousins and get his gun and shoot myself" Pt states that he is also trying to get off of benzos, alcohol, cocaine and suboxone. VSS. Axox4. Denies hallucinations

## 2017-12-25 NOTE — ED Provider Notes (Signed)
Patient left without being seen  1. Patient left without being seen       Melene PlanFloyd, Antino Mayabb, DO 12/25/17 1112

## 2017-12-25 NOTE — ED Triage Notes (Signed)
Patient is SI

## 2017-12-25 NOTE — BH Assessment (Signed)
Pt presented to lobby demanding to be seen immediately. Reception informed patient there would be a 1 -2 hour wait due to current counselors in assessment. Patient then demanding ambulance stating '' no one is trying to help me. I'm suicidal and I'm having withdrawal symptoms and I need to go [facility] . I need an ambulance. '' Patient agitated then stating he had access to gun at friends house and stated he had a plan to shoot himself. 911 called and officers to the scene. Patient adamant that he wanted to go to [facility] and that he could not tolerate wait to be seen stating he would '' jump off a bridge or jump into traffic. '' Staff accompanied patient until [employer] arrived. Pt left lobby with [employer] . Unable to complete TTS assessment at this time. [facility]

## 2017-12-26 ENCOUNTER — Encounter (HOSPITAL_COMMUNITY): Payer: Self-pay

## 2017-12-26 ENCOUNTER — Encounter (HOSPITAL_COMMUNITY): Payer: Self-pay | Admitting: Registered Nurse

## 2017-12-26 ENCOUNTER — Emergency Department (HOSPITAL_COMMUNITY)
Admission: EM | Admit: 2017-12-26 | Discharge: 2017-12-28 | Disposition: A | Discharge status: Other Acute Inpatient Hospital | Payer: Medicare Other | Attending: Emergency Medicine | Admitting: Emergency Medicine

## 2017-12-26 DIAGNOSIS — F17210 Nicotine dependence, cigarettes, uncomplicated: Secondary | ICD-10-CM | POA: Insufficient documentation

## 2017-12-26 DIAGNOSIS — F1920 Other psychoactive substance dependence, uncomplicated: Secondary | ICD-10-CM | POA: Diagnosis present

## 2017-12-26 DIAGNOSIS — F1120 Opioid dependence, uncomplicated: Secondary | ICD-10-CM | POA: Insufficient documentation

## 2017-12-26 DIAGNOSIS — F419 Anxiety disorder, unspecified: Secondary | ICD-10-CM | POA: Insufficient documentation

## 2017-12-26 DIAGNOSIS — R45851 Suicidal ideations: Secondary | ICD-10-CM

## 2017-12-26 DIAGNOSIS — F1020 Alcohol dependence, uncomplicated: Secondary | ICD-10-CM | POA: Insufficient documentation

## 2017-12-26 DIAGNOSIS — Z79899 Other long term (current) drug therapy: Secondary | ICD-10-CM | POA: Insufficient documentation

## 2017-12-26 DIAGNOSIS — F314 Bipolar disorder, current episode depressed, severe, without psychotic features: Secondary | ICD-10-CM | POA: Insufficient documentation

## 2017-12-26 MED ORDER — LORAZEPAM 1 MG PO TABS
0.0000 mg | ORAL_TABLET | Freq: Four times a day (QID) | ORAL | Status: DC
  Administered 2017-12-26: 2 mg via ORAL
  Administered 2017-12-26: 1 mg via ORAL
  Filled 2017-12-26: qty 2
  Filled 2017-12-26: qty 1

## 2017-12-26 MED ORDER — VITAMIN B-1 100 MG PO TABS
100.0000 mg | ORAL_TABLET | Freq: Every day | ORAL | Status: DC
  Administered 2017-12-26: 100 mg via ORAL
  Filled 2017-12-26: qty 1

## 2017-12-26 MED ORDER — LORAZEPAM 1 MG PO TABS
0.0000 mg | ORAL_TABLET | Freq: Two times a day (BID) | ORAL | Status: DC
Start: 2017-12-28 — End: 2017-12-26

## 2017-12-26 MED ORDER — LORAZEPAM 2 MG/ML IJ SOLN
0.0000 mg | Freq: Two times a day (BID) | INTRAMUSCULAR | Status: DC
Start: 2017-12-28 — End: 2017-12-26

## 2017-12-26 MED ORDER — NICOTINE 21 MG/24HR TD PT24
21.0000 mg | MEDICATED_PATCH | Freq: Every day | TRANSDERMAL | Status: DC
  Administered 2017-12-26: 21 mg via TRANSDERMAL
  Filled 2017-12-26: qty 1

## 2017-12-26 MED ORDER — ACETAMINOPHEN 325 MG PO TABS
650.0000 mg | ORAL_TABLET | ORAL | Status: DC | PRN
  Administered 2017-12-26: 650 mg via ORAL
  Filled 2017-12-26: qty 2

## 2017-12-26 MED ORDER — LORAZEPAM 2 MG/ML IJ SOLN: 0.0000 mg | Freq: Four times a day (QID) | INTRAMUSCULAR | Status: DC

## 2017-12-26 MED ORDER — THIAMINE HCL 100 MG/ML IJ SOLN: 100.0000 mg | Freq: Every day | INTRAMUSCULAR | Status: DC

## 2017-12-26 NOTE — ED Notes (Signed)
Regular Diet ordered for Lunch. 

## 2017-12-26 NOTE — ED Notes (Signed)
Pt states that he still feels SI, I called Carney BernJean the d/c sw and told her her pts statement and asked if I could still let  Him go , she states yes, pt states that he is still not happy but signed  For d/c. Upon cleaning room we found a    Pocket knife ,  We are giving to security

## 2017-12-26 NOTE — ED Provider Notes (Signed)
[facility] EMERGENC[facility] Provider Note   CSN: [id number] Arrival date & time: 12/25/17  1844     History   Chief Complaint Chief Complaint  Patient presents with  . Suicidal    HPI [name] [name] is a 42 y.o. male.  42 year old male with a history of bipolar disorder, depression, hepatitis C presents to the emergency department after [facility].  He states that he is having suicidal ideations with thoughts of jumping off a bridge.  He was evaluated by TTS yesterday and documentation shows recommendations were for overnight observation.  He does not feel his antidepressant is helping him and reports full compliance with his prescribed medications up until 2 days ago.  He last drank alcohol at approximately 5 PM yesterday.  He reports ongoing illicit use of benzodiazepines, cocaine, Suboxone.  No homicidal ideations expressed.      Past Medical History:  Diagnosis Date  . Bipolar disorder (HCC)   . Cellulitis   . Depression   . Drug abuse (HCC)   . Hepatitis C     Patient Active Problem List   Diagnosis Date Noted  . Alcohol use disorder, severe, dependence (HCC)   . 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 ETOH abuse   . Cirrhosis (HCC) 05/24/2015  . Suicidal ideation   . Recurrent cellulitis of lower leg 11/09/2014  . Venous (peripheral) insufficiency 11/09/2014  . Bipolar affective disorder (HCC)   . Alcohol dependence with alcohol-induced mood disorder (HCC)   . Opioid dependence with opioid-induced mood disorder (HCC)   . Severe recurrent major depression without psychotic features (HCC) 09/09/2014  . MDD (major depressive disorder) 09/09/2014  . Suicidal ideations   . Chronic pain syndrome 05/31/2014  . Tobacco dependence 05/30/2014  . Poor dentition 02/03/2014  . Hepatitis C virus infection without hepatic coma 02/03/2014  . Sepsis (HCC) 01/29/2014  .  Anxiety state, unspecified 01/31/2013  . Mood disorder in conditions classified elsewhere 01/31/2013  . Polysubstance (including opioids) dependence with physiological dependence (HCC) 01/27/2013    Class: Acute  . Alcohol withdrawal (HCC) 01/27/2013    Class: Acute    Past Surgical History:  Procedure Laterality Date  . BACK SURGERY    . ROTATOR CUFF REPAIR    . SHOULDER SURGERY         Home Medications    Prior to Admission medications   Medication Sig Start Date End Date Taking? Authorizing Provider  Buprenorphine HCl-Naloxone HCl (SUBOXONE) 8-2 MG FILM Place 1 Film under the tongue 3 (three) times daily.    Yes [provider]  clonazePAM (KLONOPIN) 1 MG tablet Take 1 mg by mouth 2 (two) times daily.   Yes [provider]  gabapentin (NEURONTIN) 400 MG capsule Take 2 capsules (800 mg total) by mouth 3 (three) times daily. For agitation 06/22/17  Yes Armandina StammerNwoko, Agnes I, NP  PARoxetine (PAXIL) 30 MG tablet Take 30 mg by mouth daily after breakfast. 12/10/17  Yes [provider]  prazosin (MINIPRESS) 5 MG capsule Take 5 mg by mouth at bedtime. 12/10/17  Yes [provider]  temazepam (RESTORIL) 15 MG capsule Take 15 mg by mouth at bedtime. 12/10/17  Yes [provider]  FLUoxetine (PROZAC) 40 MG capsule Take 1 capsule (40 mg total) by mouth daily. For depression Patient not taking: Reported on 12/25/2017 06/23/17   Armandina StammerNwoko, Agnes I, NP  furosemide (LASIX) 40 MG tablet Take 1 tablet (  40 mg total) by mouth daily. Patient not taking: Reported on 12/25/2017 07/01/17   Bing Neighbors, FNP  hydrOXYzine (ATARAX/VISTARIL) 25 MG tablet Take 1 tablet (25 mg total) by mouth every 6 (six) hours as needed for anxiety. Patient not taking: Reported on 07/01/2017 06/22/17   Armandina Stammer I, NP  Multiple Vitamin (MULTIVITAMIN WITH MINERALS) TABS tablet Take 1 tablet by mouth daily. For low Vitamin Patient not taking: Reported on 12/25/2017 06/22/17   Armandina Stammer I, NP    mupirocin ointment (BACTROBAN) 2 % Apply 1 application topically 3 (three) times daily. Patient not taking: Reported on 12/25/2017 07/01/17   Bing Neighbors, FNP  potassium chloride SA (K-DUR,KLOR-CON) 20 MEQ tablet Take 1 tablet (20 mEq total) by mouth daily. Patient not taking: Reported on 12/25/2017 07/01/17   Bing Neighbors, FNP  prazosin (MINIPRESS) 2 MG capsule Take 2 capsules (4 mg total) by mouth at bedtime. For nightmares Patient not taking: Reported on 12/25/2017 06/22/17   Armandina Stammer I, NP  QUEtiapine (SEROQUEL) 300 MG tablet Take 1 tablet (300 mg total) by mouth at bedtime. For mood control Patient not taking: Reported on 12/25/2017 06/22/17   Armandina Stammer I, NP  thiamine 100 MG tablet Take 1 tablet (100 mg total) by mouth daily. For thiamine replacement Patient not taking: Reported on 12/25/2017 06/23/17   Sanjuana Kava, NP    Family History Family History  Problem Relation Age of Onset  . Alcohol abuse Father   . Cancer Maternal Aunt     Social History Social History   Tobacco Use  . Smoking status: Current Every Day Smoker    Packs/day: 1.00    Types: Cigarettes    Start date: 10/22/1981  . Smokeless tobacco: Never Used  Substance Use Topics  . Alcohol use: Yes    Alcohol/week: 7.2 oz    Types: 12 Cans of beer per week    Comment: heavy  . Drug use: Yes    Types: Marijuana, Benzodiazepines, Other-see comments, Methamphetamines, Cocaine     Allergies   Lithium and Penicillins   Review of Systems Review of Systems Ten systems reviewed and are negative for acute change, except as noted in the HPI.    Physical Exam Updated Vital Signs BP (!) 107/59 (BP Location: Right Arm)   Pulse 64   Temp 97.7 F (36.5 C) (Oral)   Resp 18   Ht 5\' 10"  (1.778 m)   Wt 90.7 kg (200 lb)   SpO2 99%   BMI 28.70 kg/m   Physical Exam  Constitutional: He is oriented to person, place, and time. He appears well-developed and well-nourished. No distress.  Nontoxic appearing and  in NAD  HENT:  Head: Normocephalic and atraumatic.  Eyes: Conjunctivae and EOM are normal. No scleral icterus.  Neck: Normal range of motion.  Cardiovascular: Normal rate, regular rhythm and intact distal pulses.  Pulmonary/Chest: Effort normal. No respiratory distress.  Respirations even and unlabored  Musculoskeletal: Normal range of motion.  Neurological: He is alert and oriented to person, place, and time. He exhibits normal muscle tone. Coordination normal.  Skin: Skin is warm and dry. No rash noted. He is not diaphoretic. No erythema. No pallor.  Psychiatric: He has a normal mood and affect. His behavior is normal. He expresses suicidal ideation. He expresses suicidal plans (jump off bridge).  Nursing note and vitals reviewed.    ED Treatments / Results  Labs (all labs ordered are listed, but only abnormal results are displayed)  Labs Reviewed  COMPREHENSIVE METABOLIC PANEL - Abnormal; Notable for the following components:      Result Value   Glucose, Bld 119 (*)    AST 78 (*)    All other components within normal limits  ACETAMINOPHEN LEVEL - Abnormal; Notable for the following components:   Acetaminophen (Tylenol), Serum <10 (*)    All other components within normal limits  RAPID URINE DRUG SCREEN, HOSP PERFORMED - Abnormal; Notable for the following components:   Cocaine POSITIVE (*)    Benzodiazepines POSITIVE (*)    Tetrahydrocannabinol POSITIVE (*)    All other components within normal limits  ETHANOL  SALICYLATE LEVEL  CBC    EKG  EKG Interpretation None       Radiology No results found.  Procedures Procedures (including critical care time)  Medications Ordered in ED Medications  acetaminophen (TYLENOL) tablet 650 mg (650 mg Oral Given 12/26/17 0128)  nicotine (NICODERM CQ - dosed in mg/24 hours) patch 21 mg (not administered)  LORazepam (ATIVAN) injection 0-4 mg ( Intravenous See Alternative 12/26/17 0128)    Or  LORazepam (ATIVAN) tablet 0-4 mg (2 mg  Oral Given 12/26/17 0128)  LORazepam (ATIVAN) injection 0-4 mg (not administered)    Or  LORazepam (ATIVAN) tablet 0-4 mg (not administered)  thiamine (VITAMIN B-1) tablet 100 mg (not administered)    Or  thiamine (B-1) injection 100 mg (not administered)  LORazepam (ATIVAN) tablet 1 mg (1 mg Oral Given 12/25/17 1934)  ondansetron (ZOFRAN-ODT) disintegrating tablet 4 mg (4 mg Oral Given 12/25/17 1934)     Initial Impression / Assessment and Plan / ED Course  I have reviewed the triage vital signs and the nursing notes.  Pertinent labs & imaging results that were available during my care of the patient were reviewed by me and considered in my medical decision making (see chart for details).     Patient presenting for suicidal ideations following polysubstance use.  He has been seen in the emergency department x2 prior to presentation tonight.  Patient medically cleared and evaluated by TTS who have recommended psychiatric evaluation in the morning.  Disposition to be determined by oncoming ED provider.   Final Clinical Impressions(s) / ED Diagnoses   Final diagnoses:  Substance induced mood disorder [facility])    ED Discharge Orders    None       Antony Madura, PA-C 12/26/17 1610    Geoffery Lyons, MD 12/26/17 [phone]

## 2017-12-26 NOTE — ED Triage Notes (Signed)
Pt presents with continued suicidal ideations after being discharged from here this morning.  Pt reports having thoughts of jumping off a bridge, reports needing to detox from alcohol and suboxone.  Pt reports daily drinking of 2 fifths a day, reports he last drank yesterday.  Pt reports he is prescribed soboxone but wants to get off of it.  Pt reports HI towards his wife.  Pt reports he has auditory hallucinations that tell him to harm himself.

## 2017-12-26 NOTE — ED Notes (Signed)
Snack and drink was placed on bedside table.
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
## 2017-12-26 NOTE — Consult Note (Signed)
Tele Assessment    [name] PennerGary M [name], 42 y.o., male patient [facility].  Patient had complaints of suicidal ideation and requesting detox.  [facility].  Patient seen via telepsych by this provider; chart reviewed and consulted with Dr. Lucianne MussKumar on 12/26/17.   Patient had shown [facility] patient had been [facility]apentin [facility] and refused stating he could get that off of the street and demanded Librium and Clonidine.  Dr. Sharma CovertNorman assessment noted stable vital signs; aggressive behavior toward staff; and possibly drug seeking and that patient did not appear at hight risk or harm to self.  After leaving [facility] patient walked across street [facility] demanding to be seen right then; when he was unable to wait to be seen demanded ambulance; stating that he was withdrawing, suicidal, and [facility].  On evaluation [name] PennerGary M [name] reports "I came here cause they wouldn't help me.  I still need detox."  Patient states that he needs help getting stabilized on his medications.  "I need help with my psych meds."  Patient states that he has outpatient services with [facility]; he sees his therapist once a week and psychiatrist once a month.  Reports that his last visit with therapist was Tuesday and his next visit is Thursday of next week.  Patient was unable to tell what psych medications he was taking; but did state that his therapist is helping him with coping skills and that he is also going to AA.  Patient is disabled and states that [place]; but does not go there for medical or mental services "I have my own personal reasons why I don't want to go" and would not elaborate more.  Patient also informed that he lives in **Patient denies homicidal ideation, psychosis, and paranoia.  Patient would not state if he was  suicidal after discussing getting into a rehab facility and those facilities could not take patients who were suicidal related to risk and not having the staff to constantly assess suicidal patients.  Patient did states that he would be able to reschedule his [facility] appointment for an earlier date.        During evaluation [name] PennerGary M [name] is laying down in bed; he is alert/oriented x 4; calm/cooperative with appropriate  affect.  He does not appear to be responding to internal/external stimuli.  Patient denies suicidal/self-harm/homicidal ideation, psychosis, and paranoia.  Patient requesting detox; rehab services.  Patient currently on Ativan detox protocol; and has not had outburst since he is on the Ativan detox.  Patient answered question appropriately.  TTS/SW will call [facility] to see if an earlier appointment can be set for patient.  Will also check with [facility], [facility], and other substance abuse facilities for open bed; patient will be sent community resources and short/long term rehab resources to continue to look for rehab services or assistance.    Patient reported that he has outpatient services with [facility] sees his psychiatrist monthly and therapist weekly.  States Next appointment is  Thursday of next week but he is able to rescheduled for earlier visit.    Recommendations:  Patient psychiatrically cleared.  Follow up with [facility]; Give resources short/long term rehab services and community resources for substance abuse assistance.    Disposition:  No evidence of imminent risk to self or others at present.   Patient does not meet criteria for psychiatric inpatient admission.  Thomasa Heidler B. Caitlynn Ju, NP

## 2017-12-26 NOTE — ED Provider Notes (Signed)
Patient deemed medically and psychiatrically stable for discharge. Labs, vitals unremarkable/at baseline. Resources provided.   Shaune PollackIsaacs, Rayley Gao, MD 12/26/17 [phone]
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
## 2017-12-26 NOTE — ED Notes (Signed)
Regular Diet ordered for Dinner. 

## 2017-12-26 NOTE — Progress Notes (Signed)
TTS attempted to assess pt who is currently in the hallway. Rosario AdieYesnia, RN states they do not have a private room to move the pt. RN advised that TTS cannot complete the assessment in the hallway and the pt must be moved to a private room or conference room due to confidentiality. RN states she will call [facility] when the pt is able to be assessed.   Princess BruinsAquicha Luka Stohr, MSW, LCSW Therapeutic Triage Specialist  [phone]

## 2017-12-26 NOTE — ED Notes (Signed)
ED Provider at bedside. 

## 2017-12-26 NOTE — ED Notes (Signed)
Breakfast tray ordered at 0415 

## 2017-12-26 NOTE — ED Provider Notes (Signed)
[facility] EMERGENCY DEPARTMENT Provider Note   CSN: [id number] Arrival date & time: 12/26/17  1449     History   Chief Complaint Chief Complaint  Patient presents with  . Psychiatric Evaluation    HPI [name] [name] is a 42 y.o. male.  HPI  [name] [name] is a 42yo male with a history of alcohol dependence, polysubstance abuse, depression and Hepatitis C presents to the Emergency Department after being discharged from [facility] for evaluation of SI. Per chart review, patient presented to [facility] earlier today after he was discharged from [facility]. He had SI with plan to jump off of a bridge and is off of his anti-depressant. Was medically cleared and psychiatrically cleared by Assunta Found NP today. Was given information for outpatient follow up. Upon questioning, patient states that he feels no different from when he was seen earlier. Continues to have suicidal ideation with plan to jump off a bridge. Reports he has an appointment with [facility] and sees therapist weekly. Next appointment scheduled for Tuesday. Is asking for someone to change his antidepressant medication. Denies visual or auditory hallucinations. He denies fever, chills, chest pain, sob, abdominal pain, n/v, lightheadedness. Last alcohol use 5pm yesterday.   Past Medical History:  Diagnosis Date  . Bipolar disorder (HCC)   . Cellulitis   . Depression   . Drug abuse (HCC)   . Hepatitis C     Patient Active Problem List   Diagnosis Date Noted  . Alcohol use disorder, severe, dependence (HCC)   . 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 ETOH abuse   . Cirrhosis (HCC) 05/24/2015  . Suicidal ideation   . Recurrent cellulitis of lower leg 11/09/2014  . Venous (peripheral) insufficiency 11/09/2014  . Bipolar affective disorder (HCC)   . Alcohol dependence with alcohol-induced mood disorder (HCC)   . Opioid dependence with opioid-induced mood disorder  (HCC)   . Severe recurrent major depression without psychotic features (HCC) 09/09/2014  . MDD (major depressive disorder) 09/09/2014  . Suicidal ideations   . Chronic pain syndrome 05/31/2014  . Tobacco dependence 05/30/2014  . Poor dentition 02/03/2014  . Hepatitis C virus infection without hepatic coma 02/03/2014  . Sepsis (HCC) 01/29/2014  . Anxiety state, unspecified 01/31/2013  . Mood disorder in conditions classified elsewhere 01/31/2013  . Polysubstance (including opioids) dependence with physiological dependence (HCC) 01/27/2013    Class: Acute  . Alcohol withdrawal (HCC) 01/27/2013    Class: Acute    Past Surgical History:  Procedure Laterality Date  . BACK SURGERY    . ROTATOR CUFF REPAIR    . SHOULDER SURGERY         Home Medications    Prior to Admission medications   Medication Sig Start Date End Date Taking? Authorizing Provider  Buprenorphine HCl-Naloxone HCl (SUBOXONE) 8-2 MG FILM Place 1 Film under the tongue 3 (three) times daily.     [provider]  clonazePAM (KLONOPIN) 1 MG tablet Take 1 mg by mouth 2 (two) times daily.    [provider]  FLUoxetine (PROZAC) 40 MG capsule Take 1 capsule (40 mg total) by mouth daily. For depression Patient not taking: Reported on 12/25/2017 06/23/17   Armandina Stammer I, NP  furosemide (LASIX) 40 MG tablet Take 1 tablet (40 mg total) by mouth daily. Patient not taking: Reported on 12/25/2017 07/01/17   Bing Neighbors, FNP  gabapentin (NEURONTIN) 400 MG capsule Take 2 capsules (800 mg total)  by mouth 3 (three) times daily. For agitation 06/22/17   Armandina Stammer I, NP  hydrOXYzine (ATARAX/VISTARIL) 25 MG tablet Take 1 tablet (25 mg total) by mouth every 6 (six) hours as needed for anxiety. Patient not taking: Reported on 07/01/2017 06/22/17   Armandina Stammer I, NP  Multiple Vitamin (MULTIVITAMIN WITH MINERALS) TABS tablet Take 1 tablet by mouth daily. For low Vitamin Patient not taking: Reported on 12/25/2017 06/22/17    Armandina Stammer I, NP  mupirocin ointment (BACTROBAN) 2 % Apply 1 application topically 3 (three) times daily. Patient not taking: Reported on 12/25/2017 07/01/17   Bing Neighbors, FNP  PARoxetine (PAXIL) 30 MG tablet Take 30 mg by mouth daily after breakfast. 12/10/17   [provider]  potassium chloride SA (K-DUR,KLOR-CON) 20 MEQ tablet Take 1 tablet (20 mEq total) by mouth daily. Patient not taking: Reported on 12/25/2017 07/01/17   Bing Neighbors, FNP  prazosin (MINIPRESS) 2 MG capsule Take 2 capsules (4 mg total) by mouth at bedtime. For nightmares Patient not taking: Reported on 12/25/2017 06/22/17   Armandina Stammer I, NP  prazosin (MINIPRESS) 5 MG capsule Take 5 mg by mouth at bedtime. 12/10/17   [provider]  QUEtiapine (SEROQUEL) 300 MG tablet Take 1 tablet (300 mg total) by mouth at bedtime. For mood control Patient not taking: Reported on 12/25/2017 06/22/17   Armandina Stammer I, NP  temazepam (RESTORIL) 15 MG capsule Take 15 mg by mouth at bedtime. 12/10/17   [provider]  thiamine 100 MG tablet Take 1 tablet (100 mg total) by mouth daily. For thiamine replacement Patient not taking: Reported on 12/25/2017 06/23/17   Sanjuana Kava, NP    Family History Family History  Problem Relation Age of Onset  . Alcohol abuse Father   . Cancer Maternal Aunt     Social History Social History   Tobacco Use  . Smoking status: Current Every Day Smoker    Packs/day: 1.00    Types: Cigarettes    Start date: 10/22/1981  . Smokeless tobacco: Never Used  Substance Use Topics  . Alcohol use: Yes    Alcohol/week: 7.2 oz    Types: 12 Cans of beer per week    Comment: heavy  . Drug use: Yes    Types: Marijuana, Benzodiazepines, Other-see comments, Methamphetamines, Cocaine     Allergies   Lithium and Penicillins   Review of Systems Review of Systems  Constitutional: Negative for chills and fever.  Eyes: Negative for visual disturbance.  Respiratory: Negative for  shortness of breath.   Cardiovascular: Negative for chest pain.  Gastrointestinal: Negative for abdominal pain, nausea and vomiting.  Genitourinary: Negative for difficulty urinating.  Musculoskeletal: Negative for gait problem.  Skin: Negative for rash.  Neurological: Negative for light-headedness.  Psychiatric/Behavioral: Positive for suicidal ideas.     Physical Exam Updated Vital Signs BP (!) 106/53 (BP Location: Right Arm)   Pulse (!) 54   Temp 98.3 F (36.8 C)   Resp 16   Ht 5\' 10"  (1.778 m)   Wt 95.3 kg (210 lb)   SpO2 94%   BMI 30.13 kg/m   Physical Exam  Constitutional: He is oriented to person, place, and time. He appears well-developed and well-nourished. No distress.  No apparent distress.   HENT:  Head: Normocephalic and atraumatic.  Mouth/Throat: Oropharynx is clear and moist. No oropharyngeal exudate.  Eyes: Conjunctivae are normal. Pupils are equal, round, and reactive to light. Right eye exhibits no discharge. Left  eye exhibits no discharge.  Neck: Normal range of motion. Neck supple.  Cardiovascular: Normal rate and regular rhythm. Exam reveals no friction rub.  No murmur heard. Pulmonary/Chest: Effort normal and breath sounds normal. No stridor. No respiratory distress. He has no wheezes. He has no rales.  Abdominal: Soft. There is no tenderness.  Musculoskeletal: Normal range of motion.  Neurological: He is alert and oriented to person, place, and time. Coordination normal.  Skin: Skin is warm and dry. He is not diaphoretic.  Psychiatric:  Appears unkempt. Speech with appropriate speed and volume. No apparent hallucinations or delusions. Endorses SI with plan to jump off bridge.   Nursing note and vitals reviewed.    ED Treatments / Results  Labs (all labs ordered are listed, but only abnormal results are displayed) Labs Reviewed - No data to display  EKG  EKG Interpretation None       Radiology No results found.  Procedures Procedures  (including critical care time)  Medications Ordered in ED Medications  hydrOXYzine (ATARAX/VISTARIL) tablet 25 mg (25 mg Oral Given 12/27/17 0019)     Initial Impression / Assessment and Plan / ED Course  I have reviewed the triage vital signs and the nursing notes.  Pertinent labs & imaging results that were available during my care of the patient were reviewed by me and considered in my medical decision making (see chart for details).     Patient returns to the Emergency Department 30min after being discharged earlier today. He has the same complaint of suicidal ideation having thoughts to jump off of a bridge. Reports that nothing has changed from eariler. He was psychiatrically cleared and medically cleared with plan for outpatient follow up. Given resources for short/long term rehab services and community resources for substance abuse by NP Shuvon Rankin.  Patient without tachycardia or hypertension. No tremors. No evidence of active alcohol withdrawal. Reviewed labs from yesterday evening. No indication for further lab work at this time. Patient is medically cleared.   Do not feel comfortable sending patient home who is states that he will kill himself if he walks out of the ED. Plan to consult TTS. Discussed with Dr. Corlis LeakMackuen who agrees.   TTS recommends patient observed overnight for safety with plan for psych reevaluation in the morning. Patient informed.   On recheck, patient sleeping. Upon waking states that he would like medicine for withdrawal. On reassessment he has no tremors, VSS. Discuss with him that I see no signs of active withdrawal. He is requesting medicine for anxiety. I offer benadryl and he refuses. He agrees to taking Hydroxyzine.  Sign out given to night time PA Josh Geiple at shift change.   Final Clinical Impressions(s) / ED Diagnoses   Final diagnoses:  None    ED Discharge Orders    None       Kellie ShropshireShrosbree, Megumi Treaster J, PA-C 12/29/17 1154    Mackuen,  Cindee Saltourteney Lyn, MD 12/31/17 [id number]

## 2017-12-26 NOTE — ED Notes (Signed)
Telepsych completed.  

## 2017-12-26 NOTE — Progress Notes (Signed)
Disposition CSW called and spoke with pt's Psych ED Nurse, Higinio PlanNickie, to let her know pt is psych cleared and that Physician Extender note from NP is pending. Substance Use treatment referrals faxed to ED.  Timmothy EulerJean T. Kaylyn LimSutter, MSW, LCSWA Disposition Clinical Social Work [phone] (cell) [phone] (office)

## 2017-12-27 DIAGNOSIS — F1290 Cannabis use, unspecified, uncomplicated: Secondary | ICD-10-CM | POA: Diagnosis not present

## 2017-12-27 DIAGNOSIS — F1099 Alcohol use, unspecified with unspecified alcohol-induced disorder: Secondary | ICD-10-CM

## 2017-12-27 DIAGNOSIS — F1390 Sedative, hypnotic, or anxiolytic use, unspecified, uncomplicated: Secondary | ICD-10-CM | POA: Diagnosis not present

## 2017-12-27 DIAGNOSIS — R45851 Suicidal ideations: Secondary | ICD-10-CM | POA: Diagnosis not present

## 2017-12-27 DIAGNOSIS — Z811 Family history of alcohol abuse and dependence: Secondary | ICD-10-CM

## 2017-12-27 DIAGNOSIS — F1490 Cocaine use, unspecified, uncomplicated: Secondary | ICD-10-CM

## 2017-12-27 DIAGNOSIS — R440 Auditory hallucinations: Secondary | ICD-10-CM

## 2017-12-27 DIAGNOSIS — R45850 Homicidal ideations: Secondary | ICD-10-CM

## 2017-12-27 DIAGNOSIS — F1920 Other psychoactive substance dependence, uncomplicated: Secondary | ICD-10-CM

## 2017-12-27 DIAGNOSIS — F17210 Nicotine dependence, cigarettes, uncomplicated: Secondary | ICD-10-CM | POA: Diagnosis not present

## 2017-12-27 MED ORDER — HYDROXYZINE HCL 25 MG PO TABS
25.0000 mg | ORAL_TABLET | Freq: Once | ORAL | Status: AC
  Administered 2017-12-27: 25 mg via ORAL
  Filled 2017-12-27: qty 1

## 2017-12-27 MED ORDER — LORAZEPAM 1 MG PO TABS
0.0000 mg | ORAL_TABLET | Freq: Four times a day (QID) | ORAL | Status: DC
  Administered 2017-12-27: 2 mg via ORAL
  Administered 2017-12-27 – 2017-12-28 (×2): 1 mg via ORAL
  Filled 2017-12-27 (×2): qty 1
  Filled 2017-12-27: qty 2

## 2017-12-27 MED ORDER — PAROXETINE HCL 30 MG PO TABS
30.0000 mg | ORAL_TABLET | Freq: Every day | ORAL | Status: DC
Start: 2017-12-27 — End: 2017-12-28
  Administered 2017-12-27 – 2017-12-28 (×2): 30 mg via ORAL
  Filled 2017-12-27 (×2): qty 1

## 2017-12-27 MED ORDER — LORAZEPAM 2 MG/ML IJ SOLN: 1.0000 mg | Freq: Four times a day (QID) | INTRAMUSCULAR | Status: DC | PRN

## 2017-12-27 MED ORDER — FOLIC ACID 1 MG PO TABS
1.0000 mg | ORAL_TABLET | Freq: Every day | ORAL | Status: DC
  Administered 2017-12-27 – 2017-12-28 (×2): 1 mg via ORAL
  Filled 2017-12-27 (×2): qty 1

## 2017-12-27 MED ORDER — PRAZOSIN HCL 5 MG PO CAPS
5.0000 mg | ORAL_CAPSULE | Freq: Every day | ORAL | Status: DC
  Administered 2017-12-27: 5 mg via ORAL
  Filled 2017-12-27 (×2): qty 1

## 2017-12-27 MED ORDER — GABAPENTIN 400 MG PO CAPS
800.0000 mg | ORAL_CAPSULE | Freq: Three times a day (TID) | ORAL | Status: DC
  Administered 2017-12-27 – 2017-12-28 (×3): 800 mg via ORAL
  Filled 2017-12-27 (×3): qty 2

## 2017-12-27 MED ORDER — LORAZEPAM 1 MG PO TABS: 1.0000 mg | ORAL_TABLET | Freq: Four times a day (QID) | ORAL | Status: DC | PRN

## 2017-12-27 MED ORDER — LORAZEPAM 1 MG PO TABS: 0.0000 mg | ORAL_TABLET | Freq: Two times a day (BID) | ORAL | Status: DC

## 2017-12-27 MED ORDER — VITAMIN B-1 100 MG PO TABS
100.0000 mg | ORAL_TABLET | Freq: Every day | ORAL | Status: DC
  Administered 2017-12-27 – 2017-12-28 (×2): 100 mg via ORAL
  Filled 2017-12-27 (×2): qty 1

## 2017-12-27 MED ORDER — THIAMINE HCL 100 MG/ML IJ SOLN: 100.0000 mg | Freq: Every day | INTRAMUSCULAR | Status: DC

## 2017-12-27 MED ORDER — TEMAZEPAM 15 MG PO CAPS
15.0000 mg | ORAL_CAPSULE | Freq: Every day | ORAL | Status: DC
  Administered 2017-12-27: 15 mg via ORAL
  Filled 2017-12-27: qty 1

## 2017-12-27 MED ORDER — ADULT MULTIVITAMIN W/MINERALS CH
1.0000 | ORAL_TABLET | Freq: Every day | ORAL | Status: DC
  Administered 2017-12-27 – 2017-12-28 (×2): 1 via ORAL
  Filled 2017-12-27 (×2): qty 1

## 2017-12-27 NOTE — ED Notes (Signed)
Per Jacki ConesLaurie NP, pt o start on psych meds and reassess with hopes of discharge on tomorrow 12/28/17

## 2017-12-27 NOTE — ED Notes (Signed)
Pt up eating breakfast , no complaints , sitter at bedside

## 2017-12-27 NOTE — ED Notes (Signed)
Anna-Lunch Tray Ordered @ 1103-per RN-called by Nahomi Hegner  

## 2017-12-27 NOTE — ED Notes (Signed)
Pt moved to private area for tele psych assesment.

## 2017-12-27 NOTE — ED Notes (Addendum)
Pt states he is withdrawal and wants some meds he is getting louder in the hall. EDP made aware awaiting orders

## 2017-12-27 NOTE — Consult Note (Signed)
Telepsych Consultation   Reason for Consult:  Suicidal ideation/Homicidal Ideation Referring Physician:  EDP Location of Patient:  Location of Provider: [place] Department  Patient Identification: [name] [name] MRN:  [id number] Principal Diagnosis: Polysubstance (including opioids) dependence with physiological dependence [facility]) Diagnosis:   Patient Active Problem List   Diagnosis Date Noted  . Alcohol use disorder, severe, dependence ([place]) [F10.20]   . Major depressive disorder, recurrent severe without psychotic features ([place]) [F33.2] 12/03/2016  . ETOH abuse [F10.10]   . Cirrhosis ([place]) [K74.60] 05/24/2015  . Suicidal ideation [R45.851]   . Recurrent cellulitis of lower leg [L03.119] 11/09/2014  . Venous (peripheral) insufficiency [I87.2] 11/09/2014  . Bipolar affective disorder ([place]) [F31.9]   . Alcohol dependence with alcohol-induced mood disorder ([place]) [F10.24]   . Opioid dependence with opioid-induced mood disorder ([place]) [F11.24]   . Severe recurrent major depression without psychotic features ([place]) [F33.2] 09/09/2014  . MDD (major depressive disorder) [F32.9] 09/09/2014  . Suicidal ideations [R45.851]   . Chronic pain syndrome [G89.4] 05/31/2014  . Tobacco dependence [F17.200] 05/30/2014  . Poor dentition [K08.9] 02/03/2014  . Hepatitis C virus infection without hepatic coma [B19.20] 02/03/2014  . Sepsis ([place]) [A41.9] 01/29/2014  . Anxiety state, unspecified [F41.1] 01/31/2013  . Mood disorder in conditions classified elsewhere [F06.30] 01/31/2013  . Polysubstance (including opioids) dependence with physiological dependence ([place]) [F19.20] 01/27/2013    Class: Acute  . Alcohol withdrawal ([place]) [F10.239] 01/27/2013    Class: Acute    Total Time spent with patient: 30 minutes  Subjective:   [name] [name] is a 42 y.o. male patient admitted with suicidal ideation/homicidal ideation.  HPI: Per note on chart written by Windell Hummingbird, TTS counselor: [name] [name] is a 42 y.o. male who [facility] to the [facility] ED due to wanting to SI, HI, AH, and wanting to detox from the substances he has been using. According to notes from the last 30-or-so hours, it appears that pt has been to multiple EDs seeking services. Upon reading pt's assessment from 12/25/17 at 1643, some of pt's answers have changed since that time as well.  Pt reports his house was foreclosed upon and his wife recently left him. He shares he has been hearing a voice telling to hurt/kill himself and that he'd be better off dead. Pt states he hasn't slept in one month and that, if he's able to sleep, it's one hour per night at most. Pt states he's recently been having thoughts about killing his wife via murder-suicide. Pt states he has access to a gun, which he is keeping at a friend's home. Pt shares he is paranoid and that he feels like someone is always following him. He states he has no appetite.  Pt expresses experiencing PTSD and panic attacks. He shares symptoms of depression including feeling hopeless, withdrawn, fatigue, and worthless. Pt states he was hospitalized approximately one year ago and that he was prescribed medication at that time. Pt states the medication helped him greatly.  Pt has been using ETOH, heroine, benzodiazepines, and cocaine multiple times per week as recent as today. Pt expresses a desire to get off of suboxone  On exam: Pt was seen and chart reviewed with treatment team. Pt was sitting on the bed, calm and cooperative, A&O x 4 and appears depressed. Pt stated he will kill himself by jumping off of [address] [place] if he is discharged. Pt stated he lost his job in January and his wife left him. Pt stated he wants  to shoot his wife, [name]. Pt was informed of our duty to warn her about his homicidal ideation and he stated he doesn't know her number and that she is in [place] with her mother. Pt denies auditory and visual hallucinations and does not appear to be  responding to internal stimuli. Pt stated he wants to get off drugs and get his medications straightened out. Pt completed [facility] 1.5 years ago and stayed sober for 6 months. Pt was restarted on his home medications (see MAR) and will be re-assessed in the AM by psychiatry.    Past Psychiatric History: As above  Risk to Self: Suicidal Ideation: Yes-Currently Present Suicidal Intent: Yes-Currently Present Is patient at risk for suicide?: Yes Suicidal Plan?: Yes-Currently Present Specify Current Suicidal Plan: Pt has numerous plans(Pt plans to jump off bridge, shoot self, etc) Access to Means: Yes Specify Access to Suicidal Means: Pt can jump off bridge, states has gun at friend's home What has been your use of drugs/alcohol within the last 12 months?: Pt last used within the last few days How many times?: 3 Other Self Harm Risks: SA Triggers for Past Attempts: Family contact, Hallucinations(Pt states he's been hearing voices telling him to kill self) Intentional Self Injurious Behavior: None Risk to Others: Homicidal Ideation: Yes-Currently Present Thoughts of Harm to Others: Yes-Currently Present Comment - Thoughts of Harm to Others: Pt states he has thoughts of murder-suicide with wife Current Homicidal Intent: Yes-Currently Present Current Homicidal Plan: Yes-Currently Present Describe Current Homicidal Plan: Pt has plans of murder-suicide of wife Access to Homicidal Means: Yes Describe Access to Homicidal Means: Pt states he has a gun at a friend's home Identified Victim: Pt's wife History of harm to others?: No Assessment of Violence: On admission Violent Behavior Description: Unknown Does patient have access to weapons?: Yes (Comment)(Pt claims he has a gun at a friend's home) Criminal Charges Pending?: No Does patient have a court date: No Prior Inpatient Therapy: Prior Inpatient Therapy: Yes Prior Therapy Dates: 2018 Prior Therapy Facilty/Provider(s): [facility] Reason for Treatment:  MH & SA Prior Outpatient Therapy: Prior Outpatient Therapy: Yes Prior Therapy Dates: Suboxone clinic Prior Therapy Facilty/Provider(s): Unknown Reason for Treatment: SA Does patient have an ACCT team?: No Does patient have Intensive In-House Services?  : No Does patient have [facility] services? : No Does patient have P4CC services?: No  Past Medical History:  Past Medical History:  Diagnosis Date  . Bipolar disorder ([place])   . Cellulitis   . Depression   . Drug abuse ([place])   . Hepatitis C     Past Surgical History:  Procedure Laterality Date  . BACK SURGERY    . ROTATOR CUFF REPAIR    . SHOULDER SURGERY     Family History:  Family History  Problem Relation Age of Onset  . Alcohol abuse Father   . Cancer Maternal Aunt    Family Psychiatric  History: Unknown Social History:  Social History   Substance and Sexual Activity  Alcohol Use Yes  . Alcohol/week: 7.2 oz  . Types: 12 Cans of beer per week   Comment: heavy     Social History   Substance and Sexual Activity  Drug Use Yes  . Types: Marijuana, Benzodiazepines, Other-see comments, Methamphetamines, Cocaine    Social History   Socioeconomic History  . Marital status: Divorced    Spouse name: None  . Number of children: None  . Years of education: None  . Highest education level: None  Social Needs  .  Financial resource strain: None  . Food insecurity - worry: None  . Food insecurity - inability: None  . Transportation needs - medical: None  . Transportation needs - non-medical: None  Occupational History  . None  Tobacco Use  . Smoking status: Current Every Day Smoker    Packs/day: 1.00    Types: Cigarettes    Start date: 10/22/1981  . Smokeless tobacco: Never Used  Substance and Sexual Activity  . Alcohol use: Yes    Alcohol/week: 7.2 oz    Types: 12 Cans of beer per week    Comment: heavy  . Drug use: Yes    Types: Marijuana, Benzodiazepines, Other-see comments, Methamphetamines, Cocaine  .  Sexual activity: Not Currently  Other Topics Concern  . None  Social History Narrative  . None   Additional Social History:    Allergies:   Allergies  Allergen Reactions  . Lithium Anaphylaxis  . Penicillins Rash    Has patient had a PCN reaction causing immediate rash, facial/tongue/throat swelling, SOB or lightheadedness with hypotension: Yes Has patient had a PCN reaction causing severe rash involving mucus membranes or skin necrosis: No Has patient had a PCN reaction that required hospitalization No Has patient had a PCN reaction occurring within the last 10 years: No If all of the above answers are "NO", then may proceed with Cephalosporin use.     Labs:  Results for orders placed or performed during the hospital encounter of 12/25/17 (from the past 48 hour(s))  Comprehensive metabolic panel     Status: Abnormal   Collection Time: 12/25/17  7:05 PM  Result Value Ref Range   Sodium 144 135 - 145 mmol/L   Potassium 3.6 3.5 - 5.1 mmol/L   Chloride 109 101 - 111 mmol/L   CO2 26 22 - 32 mmol/L   Glucose, Bld 119 (H) 65 - 99 mg/dL   BUN 20 6 - 20 mg/dL   Creatinine, Ser 0.81 0.61 - 1.24 mg/dL   Calcium 9.1 8.9 - 10.3 mg/dL   Total Protein 6.5 6.5 - 8.1 g/dL   Albumin 3.6 3.5 - 5.0 g/dL   AST 78 (H) 15 - 41 U/L   ALT 24 17 - 63 U/L   Alkaline Phosphatase 70 38 - 126 U/L   Total Bilirubin 0.4 0.3 - 1.2 mg/dL   GFR calc non Af Amer >60 >60 mL/min   GFR calc Af Amer >60 >60 mL/min    Comment: (NOTE) The eGFR has been calculated using the CKD EPI equation. This calculation has not been validated in all clinical situations. eGFR's persistently <60 mL/min signify possible Chronic Kidney Disease.    Anion gap 9 5 - 15    Comment: Performed at [facility] [address] [place], [place] [address]  Ethanol     Status: None   Collection Time: 12/25/17  7:05 PM  Result Value Ref Range   Alcohol, Ethyl (B) <10 <10 mg/dL    Comment:        LOWEST DETECTABLE LIMIT  FOR SERUM ALCOHOL IS 10 mg/dL FOR MEDICAL PURPOSES ONLY Performed at [facility], [place] [address] [place], [place] [address]   Salicylate level     Status: None   Collection Time: 12/25/17  7:05 PM  Result Value Ref Range   Salicylate Lvl <7.0 2.8 - 30.0 mg/dL    Comment: Performed at [facility] [address] [place], [place] [address]  Acetaminophen level  Status: Abnormal   Collection Time: 12/25/17  7:05 PM  Result Value Ref Range   Acetaminophen (Tylenol), Serum <10 (L) 10 - 30 ug/mL    Comment:        THERAPEUTIC CONCENTRATIONS VARY SIGNIFICANTLY. A RANGE OF 10-30 ug/mL MAY BE AN EFFECTIVE CONCENTRATION FOR MANY PATIENTS. HOWEVER, SOME ARE BEST TREATED AT CONCENTRATIONS OUTSIDE THIS RANGE. ACETAMINOPHEN CONCENTRATIONS >150 ug/mL AT 4 HOURS AFTER INGESTION AND >50 ug/mL AT 12 HOURS AFTER INGESTION ARE OFTEN ASSOCIATED WITH TOXIC REACTIONS. Performed at [facility], [place] [address] [place], [place] [address]   cbc     Status: None   Collection Time: 12/25/17  7:05 PM  Result Value Ref Range   WBC 9.9 4.0 - 10.5 K/uL   RBC 4.43 4.22 - 5.81 MIL/uL   Hemoglobin 14.4 13.0 - 17.0 g/dL   HCT 42.3 39.0 - 52.0 %   MCV 95.5 78.0 - 100.0 fL   MCH 32.5 26.0 - 34.0 pg   MCHC 34.0 30.0 - 36.0 g/dL   RDW 14.4 11.5 - 15.5 %   Platelets 342 150 - 400 K/uL    Comment: Performed at [facility], [place] [address], [place], [place] [address]  Rapid urine drug screen (hospital performed)     Status: Abnormal   Collection Time: 12/25/17  7:07 PM  Result Value Ref Range   Opiates NONE DETECTED NONE DETECTED   Cocaine POSITIVE (A) NONE DETECTED   Benzodiazepines POSITIVE (A) NONE DETECTED   Amphetamines NONE DETECTED NONE DETECTED   Tetrahydrocannabinol POSITIVE (A) NONE DETECTED   Barbiturates NONE DETECTED NONE DETECTED    Comment: (NOTE) DRUG SCREEN FOR MEDICAL PURPOSES ONLY.  IF CONFIRMATION IS NEEDED FOR ANY PURPOSE, NOTIFY LAB WITHIN 5  DAYS. LOWEST DETECTABLE LIMITS FOR URINE DRUG SCREEN Drug Class                     Cutoff (ng/mL) Amphetamine and metabolites    1000 Barbiturate and metabolites    200 Benzodiazepine                 470 Tricyclics and metabolites     300 Opiates and metabolites        300 Cocaine and metabolites        300 THC                            50 Performed at [facility], [place] [address] [place], [place] [address]     Medications:  No current facility-administered medications for this encounter.    Current Outpatient Medications  Medication Sig Dispense Refill  . Buprenorphine HCl-Naloxone HCl (SUBOXONE) 8-2 MG FILM Place 1 Film under the tongue 3 (three) times daily.     . clonazePAM (KLONOPIN) 1 MG tablet Take 1 mg by mouth 2 (two) times daily.    Marland Kitchen gabapentin (NEURONTIN) 400 MG capsule Take 2 capsules (800 mg total) by mouth 3 (three) times daily. For agitation 180 capsule 0  . PARoxetine (PAXIL) 30 MG tablet Take 30 mg by mouth daily after breakfast.  1  . prazosin (MINIPRESS) 5 MG capsule Take 5 mg by mouth at bedtime.  0  . temazepam (RESTORIL) 15 MG capsule Take 15 mg by mouth at bedtime.  0  . FLUoxetine (PROZAC) 40 MG capsule Take 1 capsule (40 mg total) by mouth daily. For depression (Patient not taking: Reported on 12/25/2017)  30 capsule 0  . furosemide (LASIX) 40 MG tablet Take 1 tablet (40 mg total) by mouth daily. (Patient not taking: Reported on 12/25/2017) 10 tablet 0  . hydrOXYzine (ATARAX/VISTARIL) 25 MG tablet Take 1 tablet (25 mg total) by mouth every 6 (six) hours as needed for anxiety. (Patient not taking: Reported on 07/01/2017) 60 tablet 0  . Multiple Vitamin (MULTIVITAMIN WITH MINERALS) TABS tablet Take 1 tablet by mouth daily. For low Vitamin (Patient not taking: Reported on 12/25/2017) 1 tablet 0  . mupirocin ointment (BACTROBAN) 2 % Apply 1 application topically 3 (three) times daily. (Patient not taking: Reported on 12/25/2017) 90 g 0  . potassium chloride SA  (K-DUR,KLOR-CON) 20 MEQ tablet Take 1 tablet (20 mEq total) by mouth daily. (Patient not taking: Reported on 12/25/2017) 10 tablet 0  . prazosin (MINIPRESS) 2 MG capsule Take 2 capsules (4 mg total) by mouth at bedtime. For nightmares (Patient not taking: Reported on 12/25/2017) 60 capsule 0  . QUEtiapine (SEROQUEL) 300 MG tablet Take 1 tablet (300 mg total) by mouth at bedtime. For mood control (Patient not taking: Reported on 12/25/2017) 30 tablet 0  . thiamine 100 MG tablet Take 1 tablet (100 mg total) by mouth daily. For thiamine replacement (Patient not taking: Reported on 12/25/2017) 30 tablet 0    Musculoskeletal: Strength & Muscle Tone: within normal limits Gait & Station: normal Patient leans: N/A  Psychiatric Specialty Exam: Physical Exam  Constitutional: He is oriented to person, place, and time. He appears well-developed and well-nourished.  HENT:  Head: Normocephalic.  Respiratory: Effort normal.  Musculoskeletal: Normal range of motion.  Neurological: He is alert and oriented to person, place, and time.  Psychiatric: His speech is normal and behavior is normal. His mood appears anxious. Cognition and memory are normal. He expresses impulsivity. He exhibits a depressed mood. He expresses homicidal and suicidal ideation. He expresses suicidal plans and homicidal plans.    Review of Systems  Psychiatric/Behavioral: Positive for depression, substance abuse and suicidal ideas. Negative for hallucinations and memory loss. The patient is not nervous/anxious and does not have insomnia.   All other systems reviewed and are negative.   Blood pressure 128/77, pulse 74, temperature 97.9 F (36.6 C), temperature source Oral, resp. rate 18, height '5\' 10"'$  (1.778 m), weight 95.3 kg (210 lb), SpO2 99 %.Body mass index is 30.13 kg/m.  General Appearance: Casual  Eye Contact:  Good  Speech:  Clear and Coherent and Normal Rate  Volume:  Normal  Mood:  Anxious, Depressed and Irritable  Affect:   Congruent and Depressed  Thought Process:  Coherent, Goal Directed and Linear  Orientation:  Full (Time, Place, and Person)  Thought Content:  Logical  Suicidal Thoughts:  Yes.  with intent/plan  Homicidal Thoughts:  Yes.  with intent/plan  Memory:  Immediate;   Good Recent;   Good Remote;   Fair  Judgement:  Poor  Insight:  Lacking  Psychomotor Activity:  Normal  Concentration:  Concentration: Good and Attention Span: Good  Recall:  Good  Fund of Knowledge:  Good  Language:  Good  Akathisia:  No  Handed:  Right  AIMS (if indicated):     Assets:  Communication Skills Desire for Improvement  ADL's:  Intact  Cognition:  WNL  Sleep:        Treatment Plan Summary: Daily contact with patient to assess and evaluate symptoms and progress in treatment and Medication management  Disposition: Restarted on home meds. Reassess in the AM  by psychiatry  This service was provided via telemedicine using a 2-way, interactive audio and Radiographer, therapeutic.  Names of all persons participating in this telemedicine service and their role in this encounter. Name: Jinny Blossom Role: NP-C  Name: Elveria Royals  Role: NP-C  Name:  Role:   Name:  Role:     Ethelene Hal, NP 12/27/2017 12:43 PM

## 2017-12-27 NOTE — ED Notes (Signed)
Pt states he needs something to help with withdrawls EDP made aware

## 2017-12-27 NOTE — ED Notes (Signed)
Pt refusing AM vitals, pt mad he had to move [facility]

## 2017-12-27 NOTE — ED Notes (Signed)
Pt at bedside eating lunch tray 

## 2017-12-28 ENCOUNTER — Other Ambulatory Visit: Payer: Self-pay

## 2017-12-28 DIAGNOSIS — F1020 Alcohol dependence, uncomplicated: Secondary | ICD-10-CM

## 2017-12-28 MED ORDER — QUETIAPINE FUMARATE 25 MG PO TABS
25.0000 mg | ORAL_TABLET | Freq: Two times a day (BID) | ORAL | Status: DC
  Administered 2017-12-28: 25 mg via ORAL
  Filled 2017-12-28: qty 1

## 2017-12-28 MED ORDER — NICOTINE 21 MG/24HR TD PT24
21.0000 mg | MEDICATED_PATCH | Freq: Every day | TRANSDERMAL | Status: DC
  Administered 2017-12-28: 21 mg via TRANSDERMAL
  Filled 2017-12-28: qty 1

## 2017-12-28 NOTE — ED Notes (Signed)
[facility] ArtisKeisha, [facility] advised [facility], [facility], will review pt's meds d/t pt requesting med to assist w/hallucinations.

## 2017-12-28 NOTE — Consult Note (Signed)
Telepsych Consultation   Reason for Consult:  Suicidal ideation/Homicidal Ideation Referring Physician:  EDP [facility] Provider: [facility]  Patient Identification: Noelle PennerGary M Berman MRN:  [id number] Principal Diagnosis: Polysubstance (including opioids) dependence with physiological dependence [facility](HCC) Diagnosis:   Patient Active Problem List   Diagnosis Date Noted  . Alcohol use disorder, severe, dependence (HCC) [F10.20]   . Major depressive disorder, recurrent severe without psychotic features (HCC) [F33.2] 12/03/2016  . ETOH abuse [F10.10]   . Cirrhosis (HCC) [K74.60] 05/24/2015  . Suicidal ideation [R45.851]   . Recurrent cellulitis of lower leg [L03.119] 11/09/2014  . Venous (peripheral) insufficiency [I87.2] 11/09/2014  . Bipolar affective disorder (HCC) [F31.9]   . Alcohol dependence with alcohol-induced mood disorder (HCC) [F10.24]   . Opioid dependence with opioid-induced mood disorder (HCC) [F11.24]   . Severe recurrent major depression without psychotic features (HCC) [F33.2] 09/09/2014  . MDD (major depressive disorder) [F32.9] 09/09/2014  . Suicidal ideations [R45.851]   . Chronic pain syndrome [G89.4] 05/31/2014  . Tobacco dependence [F17.200] 05/30/2014  . Poor dentition [K08.9] 02/03/2014  . Hepatitis C virus infection without hepatic coma [B19.20] 02/03/2014  . Sepsis (HCC) [A41.9] 01/29/2014  . Anxiety state, unspecified [F41.1] 01/31/2013  . Mood disorder in conditions classified elsewhere [F06.30] 01/31/2013  . 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 Alcohol withdrawal (HCC) [F10.239] 01/27/2013    Class: Acute    Total Time spent with patient: 30 minutes  Subjective:   Noelle PennerGary M Soutar is a 42 y.o. male patient admitted with suicidal ideation/homicidal ideation.  HPI: Per note on chart written by Duard BradySamantha Kaufman, TTS counselor: Noelle PennerGary M  Pagnotta is a 42 y.o. male who [facility] due to wanting to SI, HI, AH, and wanting to detox from the substances he has been using. According to notes from the last 30-or-so hours, it appears that pt has been to multiple EDs seeking services. Upon reading pt's assessment from 12/25/17 at 1643, some of pt's answers have changed since that time as well.  Pt reports his house was foreclosed upon and his wife recently left him. He shares he has been hearing a voice telling to hurt/kill himself and that he'd be better off dead. Pt states he hasn't slept in one month and that, if he's able to sleep, it's one hour per night at most. Pt states he's recently been having thoughts about killing his wife via murder-suicide. Pt states he has access to a gun, which he is keeping at a friend's home. Pt shares he is paranoid and that he feels like someone is always following him. He states he has no appetite.  Pt expresses experiencing PTSD and panic attacks. He shares symptoms of depression including feeling hopeless, withdrawn, fatigue, and worthless. Pt states he was hospitalized approximately one year ago and that he was prescribed medication at that time. Pt states the medication helped him greatly.  Pt has been using ETOH, heroine, benzodiazepines, and cocaine multiple times per week as recent as today. Pt expresses a desire to get off of suboxone  On exam completed 12/27/17 by Elta GuadeloupeLaurie Parks, NP:  Pt was seen and chart reviewed with treatment team. Pt was sitting on the bed, calm and cooperative, A&O x 4 and appears depressed. Pt stated he will kill himself by jumping off of [address] [place] if he is discharged. Pt stated he lost his job in January and his  wife left him. Pt stated he wants to shoot his wife, Berneice Heinrich. Pt was informed of our duty to warn her about his homicidal ideation and he stated he doesn't know her number and that she is in [place] with her mother. Pt denies auditory and visual  hallucinations and does not appear to be responding to internal stimuli. Pt stated he wants to get off drugs and get his medications straightened out. Pt completed [facility] 1.5 years ago and stayed sober for 6 months. Pt was restarted on his home medications (see MAR) and will be re-assessed in the AM by psychiatry.    On Reassessment today: Patient was seen via tele-psych, chart reviewed with treatment team. Patient in bed, awake, alert and oriented x4. Patient reiterated the reason for this hospital admission as documented above. Patient stated, "I don't feel good. I feel like if I leave here, am going to kill myself". Patient continues to endorse suicide ideations with plan to jump off the bridge and also reporting homicide ideations towards his wife for leaving him. Patient reporting visual and auditory hallucinations. He states that he has been off his medications since mid February because [facility]. He reports he last saw his provider in January there. Patient admits to using drugs as he tested positive on cocaine, marijuana and benzo. He states he uses drugs to deal with his depression. Patient reports having no family support; has 2 daughters (47 y.o.f in [place] and 40 y.o.f living with her mother). Patient reports that he is ready to come off drug use but he needs help. Patient unable to contract for safety.  Past Psychiatric History: As above  Risk to Self: Suicidal Ideation: Yes-Currently Present Suicidal Intent: Yes-Currently Present Is patient at risk for suicide?: Yes Suicidal Plan?: Yes-Currently Present Specify Current Suicidal Plan: Pt has numerous plans(Pt plans to jump off bridge, shoot self, etc) Access to Means: Yes Specify Access to Suicidal Means: Pt can jump off bridge, states has gun at friend's home What has been your use of drugs/alcohol within the last 12 months?: Pt last used within the last few days How many times?:  3 Other Self Harm Risks: SA Triggers for Past Attempts: Family contact, Hallucinations(Pt states he's been hearing voices telling him to kill self) Intentional Self Injurious Behavior: None Risk to Others: Homicidal Ideation: Yes-Currently Present Thoughts of Harm to Others: Yes-Currently Present Comment - Thoughts of Harm to Others: Pt states he has thoughts of murder-suicide with wife Current Homicidal Intent: Yes-Currently Present Current Homicidal Plan: Yes-Currently Present Describe Current Homicidal Plan: Pt has plans of murder-suicide of wife Access to Homicidal Means: Yes Describe Access to Homicidal Means: Pt states he has a gun at a friend's home Identified Victim: Pt's wife History of harm to others?: No Assessment of Violence: On admission Violent Behavior Description: Unknown Does patient have access to weapons?: Yes (Comment)(Pt claims he has a gun at a friend's home) Criminal Charges Pending?: No Does patient have a court date: No Prior Inpatient Therapy: Prior Inpatient Therapy: Yes Prior Therapy Dates: 2018 Prior Therapy Facilty/Provider(s): [facility] Reason for Treatment: MH & SA Prior Outpatient Therapy: Prior Outpatient Therapy: Yes Prior Therapy Dates: Suboxone clinic Prior Therapy Facilty/Provider(s): Unknown Reason for Treatment: SA Does patient have an ACCT team?: No Does patient have Intensive In-House Services?  : No Does patient have [facility] services? : No Does patient have P4CC services?: No  Past Medical History:  Past Medical  History:  Diagnosis Date  . Bipolar disorder (HCC)   . Cellulitis   . Depression   . Drug abuse (HCC)   . Hepatitis C     Past Surgical History:  Procedure Laterality Date  . BACK SURGERY    . ROTATOR CUFF REPAIR    . SHOULDER SURGERY     Family History:  Family History  Problem Relation Age of Onset  . Alcohol abuse Father   . Cancer Maternal Aunt    Family Psychiatric  History: Unknown Social History:  Social  History   Substance and Sexual Activity  Alcohol Use Yes  . Alcohol/week: 7.2 oz  . Types: 12 Cans of beer per week   Comment: heavy     Social History   Substance and Sexual Activity  Drug Use Yes  . Types: Marijuana, Benzodiazepines, Other-see comments, Methamphetamines, Cocaine    Social History   Socioeconomic History  . Marital status: Divorced    Spouse name: None  . Number of children: None  . Years of education: None  . Highest education level: None  Social Needs  . Financial resource strain: None  . Food insecurity - worry: None  . Food insecurity - inability: None  . Transportation needs - medical: None  . Transportation needs - non-medical: None  Occupational History  . None  Tobacco Use  . Smoking status: Current Every Day Smoker    Packs/day: 1.00    Types: Cigarettes    Start date: 10/22/1981  . Smokeless tobacco: Never Used  Substance and Sexual Activity  . Alcohol use: Yes    Alcohol/week: 7.2 oz    Types: 12 Cans of beer per week    Comment: heavy  . Drug use: Yes    Types: Marijuana, Benzodiazepines, Other-see comments, Methamphetamines, Cocaine  . Sexual activity: Not Currently  Other Topics Concern  . None  Social History Narrative  . None   Additional Social History:    Allergies:   Allergies  Allergen Reactions  . Lithium Anaphylaxis  . Penicillins Rash    Has patient had a PCN reaction causing immediate rash, facial/tongue/throat swelling, SOB or lightheadedness with hypotension: Yes Has patient had a PCN reaction causing severe rash involving mucus membranes or skin necrosis: No Has patient had a PCN reaction that required hospitalization No Has patient had a PCN reaction occurring within the last 10 years: No If all of the above answers are "NO", then may proceed with Cephalosporin use.     Labs:  No results found for this or any previous visit (from the past 48 hour(s)).  Medications:  Current Facility-Administered  Medications  Medication Dose Route Frequency Provider Last Rate Last Dose  . folic acid (FOLVITE) tablet 1 mg  1 mg Oral Daily Charlynne Pander, MD   1 mg at 12/28/17 1016  . gabapentin (NEURONTIN) capsule 800 mg  800 mg Oral TID Laveda Abbe, NP   800 mg at 12/28/17 1016  . LORazepam (ATIVAN) tablet 1 mg  1 mg Oral Q6H PRN Charlynne Pander, MD       Or  . LORazepam (ATIVAN) injection 1 mg  1 mg Intravenous Q6H PRN Charlynne Pander, MD      . LORazepam (ATIVAN) tablet 0-4 mg  0-4 mg Oral Q6H Charlynne Pander, MD   1 mg at 12/27/17 1848   Followed by  . [START ON 12/29/2017] LORazepam (ATIVAN) tablet 0-4 mg  0-4 mg Oral Q12H Charlynne Pander, MD      .  multivitamin with minerals tablet 1 tablet  1 tablet Oral Daily Charlynne Pander, MD   1 tablet at 12/28/17 1016  . nicotine (NICODERM CQ - dosed in mg/24 hours) patch 21 mg  21 mg Transdermal Daily Gwyneth Sprout, MD   21 mg at 12/28/17 1016  . PARoxetine (PAXIL) tablet 30 mg  30 mg Oral Daily Laveda Abbe, NP   30 mg at 12/28/17 1016  . prazosin (MINIPRESS) capsule 5 mg  5 mg Oral QHS Laveda Abbe, NP   5 mg at 12/27/17 2152  . temazepam (RESTORIL) capsule 15 mg  15 mg Oral QHS Laveda Abbe, NP   15 mg at 12/27/17 2127  . thiamine (VITAMIN B-1) tablet 100 mg  100 mg Oral Daily Charlynne Pander, MD   100 mg at 12/28/17 1016   Or  . thiamine (B-1) injection 100 mg  100 mg Intravenous Daily Charlynne Pander, MD       Current Outpatient Medications  Medication Sig Dispense Refill  . Buprenorphine HCl-Naloxone HCl (SUBOXONE) 8-2 MG FILM Place 1 Film under the tongue 3 (three) times daily.     . clonazePAM (KLONOPIN) 1 MG tablet Take 1 mg by mouth 2 (two) times daily.    Marland Kitchen gabapentin (NEURONTIN) 400 MG capsule Take 2 capsules (800 mg total) by mouth 3 (three) times daily. For agitation 180 capsule 0  . PARoxetine (PAXIL) 30 MG tablet Take 30 mg by mouth daily after breakfast.  1  . prazosin  (MINIPRESS) 5 MG capsule Take 5 mg by mouth at bedtime.  0  . temazepam (RESTORIL) 15 MG capsule Take 15 mg by mouth at bedtime.  0  . FLUoxetine (PROZAC) 40 MG capsule Take 1 capsule (40 mg total) by mouth daily. For depression (Patient not taking: Reported on 12/25/2017) 30 capsule 0  . furosemide (LASIX) 40 MG tablet Take 1 tablet (40 mg total) by mouth daily. (Patient not taking: Reported on 12/25/2017) 10 tablet 0  . hydrOXYzine (ATARAX/VISTARIL) 25 MG tablet Take 1 tablet (25 mg total) by mouth every 6 (six) hours as needed for anxiety. (Patient not taking: Reported on 07/01/2017) 60 tablet 0  . Multiple Vitamin (MULTIVITAMIN WITH MINERALS) TABS tablet Take 1 tablet by mouth daily. For low Vitamin (Patient not taking: Reported on 12/25/2017) 1 tablet 0  . mupirocin ointment (BACTROBAN) 2 % Apply 1 application topically 3 (three) times daily. (Patient not taking: Reported on 12/25/2017) 90 g 0  . potassium chloride SA (K-DUR,KLOR-CON) 20 MEQ tablet Take 1 tablet (20 mEq total) by mouth daily. (Patient not taking: Reported on 12/25/2017) 10 tablet 0  . prazosin (MINIPRESS) 2 MG capsule Take 2 capsules (4 mg total) by mouth at bedtime. For nightmares (Patient not taking: Reported on 12/25/2017) 60 capsule 0  . QUEtiapine (SEROQUEL) 300 MG tablet Take 1 tablet (300 mg total) by mouth at bedtime. For mood control (Patient not taking: Reported on 12/25/2017) 30 tablet 0  . thiamine 100 MG tablet Take 1 tablet (100 mg total) by mouth daily. For thiamine replacement (Patient not taking: Reported on 12/25/2017) 30 tablet 0    Musculoskeletal: UTA via camera  Psychiatric Specialty Exam: Physical Exam  Constitutional: He is oriented to person, place, and time. He appears well-developed and well-nourished.  HENT:  Head: Normocephalic.  Respiratory: Effort normal.  Musculoskeletal: Normal range of motion.  Neurological: He is alert and oriented to person, place, and time.  Psychiatric: His speech is normal and  behavior  is normal. His mood appears anxious. Cognition and memory are normal. He expresses impulsivity. He exhibits a depressed mood. He expresses homicidal and suicidal ideation. He expresses suicidal plans and homicidal plans.    Review of Systems  Psychiatric/Behavioral: Positive for depression, substance abuse and suicidal ideas. Negative for hallucinations and memory loss. The patient is not nervous/anxious and does not have insomnia.   All other systems reviewed and are negative.   Blood pressure 127/76, pulse 62, temperature 97.9 F (36.6 C), temperature source Oral, resp. rate 16, height 5\' 10"  (1.778 m), weight 95.3 kg (210 lb), SpO2 95 %.Body mass index is 30.13 kg/m.  General Appearance: Casual  Eye Contact:  Good  Speech:  Clear and Coherent and Normal Rate  Volume:  Normal  Mood:  Anxious, Depressed and Irritable  Affect:  Congruent and Depressed  Thought Process:  Coherent, Goal Directed and Descriptions of Associations: Circumstantial  Orientation:  Full (Time, Place, and Person)  Thought Content:  Logical  Suicidal Thoughts:  Yes.  with intent/plan  Homicidal Thoughts:  Yes.  with intent/plan  Memory:  Immediate;   Good Recent;   Good Remote;   Fair  Judgement:  Poor  Insight:  Lacking  Psychomotor Activity:  Normal  Concentration:  Concentration: Good and Attention Span: Good  Recall:  Good  Fund of Knowledge:  Good  Language:  Good  Akathisia:  No  Handed:  Right  AIMS (if indicated):     Assets:  Communication Skills Desire for Improvement  ADL's:  Intact  Cognition:  WNL  Sleep:        Treatment Plan Summary: Daily contact with patient to assess and evaluate symptoms and progress in treatment, Medication management and Plan to admit to psych inpatient Patient continues to endorse SI/HI/VAH and unable to contract for safety -Continue current medication regimen -Start Seroquel 25 mg 1 tab PO BID for psychosis   Disposition: Recommend psychiatric  Inpatient admission when medically cleared.  This service was provided via telemedicine using a 2-way, interactive audio and video technology.  Names of all persons participating in this telemedicine service and their role in this encounter. Name: Jaren Vanetten Role: Patient  Name: Olinda Nola A. Henriette Hesser Role: NP  Name:  Role:   Name:  Role:     Delila Pereyra, NP 12/28/2017 10:42 AM

## 2017-12-28 NOTE — Progress Notes (Signed)
Per Leighton Ruffina Okonkwo NP, pt continues to meet inpatient criteria. Becky RN notified. [facility]. CSW faxed referrals to the following facilities for review.  [facility], [facility], [facility], [facility], [facility], [address] [facility], [facility], [facility], [facility], and [facility].  TTS will continue to seek bed placement.   Moss McKy-sha Lucio Litsey, MSW, LCSW-A, LCAS-A 12/28/2017 10:46 AM

## 2017-12-28 NOTE — ED Provider Notes (Addendum)
Alert [place] Coma Score 15.  Stable.  Patient appears in no distress   Doug SouJacubowitz, Talal Fritchman, MD 12/28/17 1201 Stable for transfer to [facility].  Accepting physician Dr. Mable Fillornwall    Giovanni Biby, MD 12/28/17 [phone]

## 2017-12-28 NOTE — ED Notes (Signed)
Pt given graham crackers, peanutbutter and a caff free coke for snack.

## 2017-12-28 NOTE — ED Notes (Signed)
Pt aware of tx plan - accepted to [facility] - voiced understanding and agreement. Pt states no one knows he is in the hospital and has no one to call and notify of transfer.

## 2017-12-28 NOTE — Progress Notes (Signed)
[name] [name] in admission at [name] offered a bed for patient. The accepting physician is Dr.Thomas Cornwell. The number for report is [phone]. They are willing to accept him as soon as possible at their main campus. CSW notified Kriste BasqueBecky, Charity fundraiserN.   Moss McKy-Sha Ozro Russett MSW, LCSW-A, LCAS-A Clinical Social Worker 12/28/2017 11:58 AM

## 2017-12-28 NOTE — ED Notes (Signed)
Pt in shower.  

## 2017-12-28 NOTE — ED Notes (Signed)
Dr Jacubowitz in w/pt. 

## 2017-12-28 NOTE — ED Notes (Signed)
Pt ambulatory to nurses' desk asking for Nicotine Patch.

## 2017-12-28 NOTE — ED Notes (Signed)
Pt asking "What did she say about me?" Referring to Telepsych. Advised continuing to seek inpt. Voiced understanding and agreement. Asked pt if wanted to shower x 2 - pt voiced agreement. Pt then asked for med to assist w/hallucinations. Asked pt if he had [facility], during [facility]. He advised yes. Advised him will administer meds when ordered - voiced understanding.

## 2018-01-24 ENCOUNTER — Emergency Department (HOSPITAL_COMMUNITY)
Admission: EM | Admit: 2018-01-24 | Discharge: 2018-01-27 | Disposition: A | Discharge status: Other Acute Inpatient Hospital | Payer: Medicare Other | Attending: Emergency Medicine | Admitting: Emergency Medicine

## 2018-01-24 ENCOUNTER — Other Ambulatory Visit: Payer: Self-pay

## 2018-01-24 ENCOUNTER — Encounter (HOSPITAL_COMMUNITY): Payer: Self-pay | Admitting: Nurse Practitioner

## 2018-01-24 ENCOUNTER — Encounter (HOSPITAL_COMMUNITY): Payer: Self-pay

## 2018-01-24 ENCOUNTER — Emergency Department (HOSPITAL_COMMUNITY)
Admission: EM | Admit: 2018-01-24 | Discharge: 2018-01-24 | Disposition: A | Discharge status: Home / Self Care | Payer: Medicare Other | Source: Home / Self Care | Attending: Emergency Medicine | Admitting: Emergency Medicine

## 2018-01-24 DIAGNOSIS — F1210 Cannabis abuse, uncomplicated: Secondary | ICD-10-CM

## 2018-01-24 DIAGNOSIS — R451 Restlessness and agitation: Secondary | ICD-10-CM | POA: Insufficient documentation

## 2018-01-24 DIAGNOSIS — R440 Auditory hallucinations: Secondary | ICD-10-CM | POA: Diagnosis not present

## 2018-01-24 DIAGNOSIS — R45851 Suicidal ideations: Secondary | ICD-10-CM

## 2018-01-24 DIAGNOSIS — F1920 Other psychoactive substance dependence, uncomplicated: Secondary | ICD-10-CM | POA: Insufficient documentation

## 2018-01-24 DIAGNOSIS — F1410 Cocaine abuse, uncomplicated: Secondary | ICD-10-CM | POA: Insufficient documentation

## 2018-01-24 DIAGNOSIS — F17210 Nicotine dependence, cigarettes, uncomplicated: Secondary | ICD-10-CM | POA: Insufficient documentation

## 2018-01-24 DIAGNOSIS — F319 Bipolar disorder, unspecified: Secondary | ICD-10-CM | POA: Insufficient documentation

## 2018-01-24 DIAGNOSIS — F329 Major depressive disorder, single episode, unspecified: Secondary | ICD-10-CM | POA: Insufficient documentation

## 2018-01-24 DIAGNOSIS — Z046 Encounter for general psychiatric examination, requested by authority: Secondary | ICD-10-CM | POA: Insufficient documentation

## 2018-01-24 DIAGNOSIS — Z79899 Other long term (current) drug therapy: Secondary | ICD-10-CM | POA: Insufficient documentation

## 2018-01-24 LAB — SALICYLATE LEVEL
Salicylate Lvl: <7 mg/dL (ref 2.8–30.0)
Salicylate Lvl: <7 mg/dL (ref 2.8–30.0)

## 2018-01-24 LAB — RAPID URINE DRUG SCREEN, HOSP PERFORMED
AMPHETAMINES: NOT DETECTED
Amphetamines: NOT DETECTED
BARBITURATES: NOT DETECTED
BARBITURATES: NOT DETECTED
BENZODIAZEPINES: NOT DETECTED
BENZODIAZEPINES: NOT DETECTED
Cocaine: POSITIVE — AB
Cocaine: POSITIVE — AB
Opiates: NOT DETECTED
Opiates: NOT DETECTED
Tetrahydrocannabinol: POSITIVE — AB
Tetrahydrocannabinol: POSITIVE — AB

## 2018-01-24 LAB — COMPREHENSIVE METABOLIC PANEL
ALBUMIN: 4.4 g/dL (ref 3.5–5.0)
ALT: 20 U/L (ref 17–63)
ALT: 18 U/L (ref 17–63)
AST: 20 U/L (ref 15–41)
AST: 22 U/L (ref 15–41)
Albumin: 4.4 g/dL (ref 3.5–5.0)
Alkaline Phosphatase: 77 U/L (ref 38–126)
Alkaline Phosphatase: 85 U/L (ref 38–126)
Anion gap: 9 (ref 5–15)
Anion gap: 12 (ref 5–15)
BILIRUBIN TOTAL: 0.7 mg/dL (ref 0.3–1.2)
BILIRUBIN TOTAL: 0.5 mg/dL (ref 0.3–1.2)
BUN: 13 mg/dL (ref 6–20)
BUN: 14 mg/dL (ref 6–20)
CALCIUM: 9.7 mg/dL (ref 8.9–10.3)
CHLORIDE: 105 mmol/L (ref 101–111)
CO2: 27 mmol/L (ref 22–32)
CO2: 23 mmol/L (ref 22–32)
Calcium: 9.8 mg/dL (ref 8.9–10.3)
Chloride: 103 mmol/L (ref 101–111)
Creatinine, Ser: 0.63 mg/dL (ref 0.61–1.24)
Creatinine, Ser: 0.64 mg/dL (ref 0.61–1.24)
GFR calc Af Amer: >60 mL/min (ref >60)
GFR calc Af Amer: >60 mL/min (ref >60)
GFR calc non Af Amer: >60 mL/min (ref >60)
GLUCOSE: 107 mg/dL — AB (ref 65–99)
Glucose, Bld: 104 mg/dL — ABNORMAL HIGH (ref 65–99)
POTASSIUM: 4.2 mmol/L (ref 3.5–5.1)
POTASSIUM: 4.1 mmol/L (ref 3.5–5.1)
Sodium: 141 mmol/L (ref 135–145)
Sodium: 138 mmol/L (ref 135–145)
TOTAL PROTEIN: 7.5 g/dL (ref 6.5–8.1)
Total Protein: 8 g/dL (ref 6.5–8.1)

## 2018-01-24 LAB — ETHANOL: Alcohol, Ethyl (B): <10 mg/dL (ref <10)

## 2018-01-24 LAB — CBC
HCT: 45 % (ref 39.0–52.0)
HEMATOCRIT: 45.2 % (ref 39.0–52.0)
HEMOGLOBIN: 15.8 g/dL (ref 13.0–17.0)
Hemoglobin: 15.6 g/dL (ref 13.0–17.0)
MCH: 32.4 pg (ref 26.0–34.0)
MCH: 31.9 pg (ref 26.0–34.0)
MCHC: 35 g/dL (ref 30.0–36.0)
MCHC: 34.7 g/dL (ref 30.0–36.0)
MCV: 92.6 fL (ref 78.0–100.0)
MCV: 92 fL (ref 78.0–100.0)
PLATELETS: 377 10*3/uL (ref 150–400)
Platelets: 358 10*3/uL (ref 150–400)
RBC: 4.88 MIL/uL (ref 4.22–5.81)
RBC: 4.89 MIL/uL (ref 4.22–5.81)
RDW: 13.5 % (ref 11.5–15.5)
RDW: 13.6 % (ref 11.5–15.5)
WBC: 15.7 10*3/uL — AB (ref 4.0–10.5)
WBC: 17.1 10*3/uL — AB (ref 4.0–10.5)

## 2018-01-24 LAB — ACETAMINOPHEN LEVEL: Acetaminophen (Tylenol), Serum: <10 ug/mL — ABNORMAL LOW (ref 10–30)

## 2018-01-24 MED ORDER — ALUM & MAG HYDROXIDE-SIMETH 200-200-20 MG/5ML PO SUSP: 30.0000 mL | Freq: Four times a day (QID) | ORAL | Status: DC | PRN

## 2018-01-24 MED ORDER — LORAZEPAM 2 MG/ML IJ SOLN: 0.0000 mg | Freq: Two times a day (BID) | INTRAMUSCULAR | Status: DC

## 2018-01-24 MED ORDER — THIAMINE HCL 100 MG/ML IJ SOLN: 100.0000 mg | Freq: Every day | INTRAMUSCULAR | Status: DC

## 2018-01-24 MED ORDER — LORAZEPAM 1 MG PO TABS: 0.0000 mg | ORAL_TABLET | Freq: Four times a day (QID) | ORAL | Status: DC

## 2018-01-24 MED ORDER — IBUPROFEN 400 MG PO TABS
600.0000 mg | ORAL_TABLET | Freq: Three times a day (TID) | ORAL | Status: DC | PRN
  Administered 2018-01-24 – 2018-01-26 (×2): 600 mg via ORAL
  Filled 2018-01-24 (×2): qty 1

## 2018-01-24 MED ORDER — NICOTINE 21 MG/24HR TD PT24
21.0000 mg | MEDICATED_PATCH | Freq: Every day | TRANSDERMAL | Status: DC
  Administered 2018-01-24 – 2018-01-27 (×4): 21 mg via TRANSDERMAL
  Filled 2018-01-24 (×4): qty 1

## 2018-01-24 MED ORDER — VITAMIN B-1 100 MG PO TABS: 100.0000 mg | ORAL_TABLET | Freq: Every day | ORAL | Status: DC

## 2018-01-24 MED ORDER — LORAZEPAM 1 MG PO TABS: 0.0000 mg | ORAL_TABLET | Freq: Two times a day (BID) | ORAL | Status: DC

## 2018-01-24 MED ORDER — VITAMIN B-1 100 MG PO TABS
100.0000 mg | ORAL_TABLET | Freq: Every day | ORAL | Status: DC
  Administered 2018-01-24 – 2018-01-25 (×2): 100 mg via ORAL
  Filled 2018-01-24 (×2): qty 1

## 2018-01-24 MED ORDER — NICOTINE 21 MG/24HR TD PT24: 21.0000 mg | MEDICATED_PATCH | Freq: Every day | TRANSDERMAL | Status: DC

## 2018-01-24 MED ORDER — IBUPROFEN 200 MG PO TABS: 600.0000 mg | ORAL_TABLET | Freq: Three times a day (TID) | ORAL | Status: DC | PRN

## 2018-01-24 MED ORDER — LORAZEPAM 2 MG/ML IJ SOLN: 0.0000 mg | Freq: Four times a day (QID) | INTRAMUSCULAR | Status: DC

## 2018-01-24 MED ORDER — ONDANSETRON HCL 4 MG PO TABS: 4.0000 mg | ORAL_TABLET | Freq: Three times a day (TID) | ORAL | Status: DC | PRN

## 2018-01-24 MED ORDER — LORAZEPAM 1 MG PO TABS
0.0000 mg | ORAL_TABLET | Freq: Four times a day (QID) | ORAL | Status: DC
  Administered 2018-01-24: 2 mg via ORAL
  Administered 2018-01-25: 1 mg via ORAL
  Filled 2018-01-24: qty 2
  Filled 2018-01-24: qty 1

## 2018-01-24 NOTE — BH Assessment (Signed)
Attempted to contact pt's nurse to complete pt's TTS Assessment but was unable to make contact.

## 2018-01-24 NOTE — BH Assessment (Addendum)
Assessment Note  [name] [name] is a 42 y.o. male who [facility] after being discharged from [facility] ED due to having ongoing suicidal thoughts. Pt had been discharged from [facility] earlier today and, upon d/c, became belligerent with staff, yelling, swearing, and throwing things at them, requiring to be escorted out by security. Pt later arrived at [facility] complaining of the same concerns, though he is now having HI towards his neighbors. Pt shares he has been increasingly suicidal and has been hearing voices over the last week. He shares he has been hearing voices "off and on" for the last 5 years and that he has had problems with suicidal thoughts for years. Pt states his plan is to jump off of a bridge and that he has had this plan for the last week.  Pt shares he last attempted suicide 6 months ago and that he was subsequently hospitalized in [place] for a few days. Pt shares he has attempted suicide 3 times and he estimates he has been hospitalized 10 times. He shares he has been diagnosed with Bipolar Disorder, Schizoaffective Disorder, and Depression. He states he was prescribed medication to take after his last hospitalization, though they "weren't helping really," so he stopped taking them due to this and because he ran out. Pt states he hasn't been taking his medications for three weeks. Pt shares he had both inpatient and outpatient therapy in the past several years, though he does not remember when or where.   Pt denies any pending legal charges, upcoming court dates, or being on probation. He denies any past verbal, physical, or sexual abuse at the hands of someone else. He says his sister is a support to him, though he shares that the support she offers is minimal. He states he lives alone in a home and that he supports himself through his disability check. He earned his GED. Pt shares a SA history of daily EtOH and marijuana use and cocaine use several times per month. He denies NSSIB.  Pt  was oriented x4. His recent and remote memory is intact. Pt was cooperative throughout the assessment, though it should be noted that several of his answers changed from what he told clinician at this time from what he told others this morning. Pt's insight, judgement, and impulse control are impaired.    Diagnosis: F31.5, Bipolar I disorder, Current or most recent episode depressed, With psychotic features   Past Medical History:  Past Medical History:  Diagnosis Date  . Bipolar disorder (HCC)   . Cellulitis   . Depression   . Drug abuse (HCC)   . Hepatitis C     Past Surgical History:  Procedure Laterality Date  . BACK SURGERY    . ROTATOR CUFF REPAIR    . SHOULDER SURGERY      Family History:  Family History  Problem Relation Age of Onset  . Alcohol abuse Father   . Cancer Maternal Aunt     Social History:  reports that he has been smoking cigarettes.  He started smoking about 36 years ago. He has been smoking about 1.00 pack per day. He has never used smokeless tobacco. He reports that he drinks about 7.2 oz of alcohol per week. He reports that he has current or past drug history. Drugs: Marijuana, Benzodiazepines, Other-see comments, Methamphetamines, and Cocaine.  Additional Social History:  Alcohol / Drug Use Pain Medications: Please see MAR Prescriptions: Please see MAR Over the Counter: Please see MAR History  of alcohol / drug use?: Yes Longest period of sobriety (when/how long): Please see MAR Substance #1 Name of Substance 1: EtOH 1 - Age of First Use: 13 1 - Amount (size/oz): 3 5ths 1 - Frequency: Daily 1 - Duration: Unknown 1 - Last Use / Amount: 01/24/18 Substance #2 Name of Substance 2: Cocaine 2 - Age of First Use: 21 2 - Amount (size/oz): "A couple grams" 2 - Frequency: "A few times a month" 2 - Duration: Unknown 2 - Last Use / Amount: 01/22/18 Substance #3 Name of Substance 3: Marijuana 3 - Age of First Use: 16 3 - Amount (size/oz): "1 blunt" 3  - Frequency: Daily 3 - Duration: Unknown 3 - Last Use / Amount: 01/24/18  CIWA: CIWA-Ar BP: 134/86 Pulse Rate: 86 Nausea and Vomiting: 2 Tactile Disturbances: mild itching, pins and needles, burning or numbness Tremor: not visible, but can be felt fingertip to fingertip Auditory Disturbances: not present Paroxysmal Sweats: barely perceptible sweating, palms moist Visual Disturbances: not present Anxiety: moderately anxious, or guarded, so anxiety is inferred Headache, Fullness in Head: moderately severe Agitation: normal activity Orientation and Clouding of Sensorium: oriented and can do serial additions CIWA-Ar Total: 14 COWS:    Allergies:  Allergies  Allergen Reactions  . Lithium Anaphylaxis  . Penicillins Rash    Has patient had a PCN reaction causing immediate rash, facial/tongue/throat swelling, SOB or lightheadedness with hypotension: Yes Has patient had a PCN reaction causing severe rash involving mucus membranes or skin necrosis: No Has patient had a PCN reaction that required hospitalization: No Has patient had a PCN reaction occurring within the last 10 years: No If all of the above answers are "NO", then may proceed with Cephalosporin use.     Home Medications:  (Not in a hospital admission)  OB/GYN Status:  No LMP for male patient.  General Assessment Data Location of Assessment: [facility] ED TTS Assessment: In system Is this a Tele or Face-to-Face Assessment?: Tele Assessment Is this an Initial Assessment or a Re-assessment for this encounter?: Initial Assessment Marital status: Separated Maiden name: Criswell Is patient pregnant?: No Pregnancy Status: No Living Arrangements: Alone Can pt return to current living arrangement?: Yes Admission Status: Voluntary Is patient capable of signing voluntary admission?: Yes Referral Source: MD Insurance type: Medicare  Medical Screening Exam [facility]) Medical Exam completed: Yes  Crisis Care Plan Living  Arrangements: Alone Legal Guardian: Other:(N/A) Name of Psychiatrist: N/A Name of Therapist: N/A  Education Status Is patient currently in school?: No Is the patient employed, unemployed or receiving disability?: Receiving disability income  Risk to self with the past 6 months Suicidal Ideation: Yes-Currently Present Has patient been a risk to self within the past 6 months prior to admission? : Yes Suicidal Intent: Yes-Currently Present Has patient had any suicidal intent within the past 6 months prior to admission? : Yes Is patient at risk for suicide?: Yes Suicidal Plan?: Yes-Currently Present Has patient had any suicidal plan within the past 6 months prior to admission? : Yes Specify Current Suicidal Plan: Pt plans to jump off of a bridge Access to Means: Yes Specify Access to Suicidal Means: Pt could find a bridge to jump off of What has been your use of drugs/alcohol within the last 12 months?: Pt shares he actively uses EoTH, cocaine, and marijuana Previous Attempts/Gestures: Yes How many times?: 3 Other Self Harm Risks: SA Triggers for Past Attempts: Hallucinations Intentional Self Injurious Behavior: None Family Suicide History: No Recent stressful life  event(s): Other (Comment)(None known) Persecutory voices/beliefs?: No Depression: Yes Depression Symptoms: Insomnia, Isolating, Fatigue, Guilt, Feeling worthless/self pity, Feeling angry/irritable Substance abuse history and/or treatment for substance abuse?: Yes Suicide prevention information given to non-admitted patients: Not applicable  Risk to Others within the past 6 months Homicidal Ideation: Yes-Currently Present Does patient have any lifetime risk of violence toward others beyond the six months prior to admission? : No Thoughts of Harm to Others: Yes-Currently Present Comment - Thoughts of Harm to Others: Pt states he is angry at his neighbors Current Homicidal Intent: Yes-Currently Present Current Homicidal  Plan: Yes-Currently Present Describe Current Homicidal Plan: Pt wants to kill his neighbors Access to Homicidal Means: Yes Describe Access to Homicidal Means: Pt could obtain objects to harm his neighbors Identified Victim: Neighbors History of harm to others?: No Assessment of Violence: On admission Violent Behavior Description: Unknown Does patient have access to weapons?: Yes (Comment)(Pt has, in the past, stated he has access to a gun) Criminal Charges Pending?: No Does patient have a court date: No Is patient on probation?: No  Psychosis Hallucinations: Auditory Delusions: None noted  Mental Status Report Appearance/Hygiene: Disheveled, In scrubs Eye Contact: Poor Motor Activity: Rigidity Speech: Logical/coherent, Unremarkable Level of Consciousness: Alert Mood: Worthless, low self-esteem Affect: Apprehensive Anxiety Level: Minimal Thought Processes: Circumstantial Judgement: Impaired Orientation: Person, Place, Time, Situation Obsessive Compulsive Thoughts/Behaviors: None  Cognitive Functioning Concentration: Good Memory: Recent Intact, Remote Intact Is patient IDD: No Is patient DD?: Yes Insight: Poor Impulse Control: Poor Appetite: Good Have you had any weight changes? : No Change Amount of the weight change? (lbs): 0 lbs Sleep: Decreased Total Hours of Sleep: 6 Vegetative Symptoms: None  ADLScreening [facility] Assessment Services) Patient's cognitive ability adequate to safely complete daily activities?: Yes Patient able to express need for assistance with ADLs?: No Independently performs ADLs?: No  Prior Inpatient Therapy Prior Inpatient Therapy: Yes Prior Therapy Dates: 2018, 12/2017 Prior [facility] Reason for Treatment: MH, SA  Prior Outpatient Therapy Prior Outpatient Therapy: Yes Prior Therapy Dates: Unknown Prior Therapy Facilty/Provider(s): Unknown Reason for Treatment: SA Does patient have an ACCT team?:  No Does patient have Intensive In-House Services?  : No Does patient have [facility] services? : No Does patient have P4CC services?: No  ADL Screening (condition at time of admission) Patient's cognitive ability adequate to safely complete daily activities?: Yes Is the patient deaf or have difficulty hearing?: No Does the patient have difficulty seeing, even when wearing glasses/contacts?: No Does the patient have difficulty concentrating, remembering, or making decisions?: Yes Patient able to express need for assistance with ADLs?: No Does the patient have difficulty dressing or bathing?: Yes Independently performs ADLs?: No       Abuse/Neglect Assessment (Assessment to be complete while patient is alone) Abuse/Neglect Assessment Can Be Completed: Yes Physical Abuse: Denies Verbal Abuse: Denies Sexual Abuse: Denies Exploitation of patient/patient's resources: Denies Self-Neglect: Denies Values / Beliefs Cultural Requests During Hospitalization: None Spiritual Requests During Hospitalization: None Consults Spiritual Care Consult Needed: No Social Work Consult Needed: No Merchant navy officerAdvance Directives (For Healthcare) Does Patient Have a Medical Advance Directive?: No Would patient like information on creating a medical advance directive?: No - Patient declined          Disposition: Nira ConnJason Berry, NP, reviewed pt's chart and information and determined that pt should be observed overnight and re-assessed in the morning to determine if inpatient hospitalization is necessary. This information was provided to pt's nurse, Cathlean MarseillesKari RN, at 2251 on 01/24/18.  Disposition Initial Assessment Completed for this Encounter: Yes Patient referred to: Other (Comment)(Jason Allyson Sabal, NP, states observe overnight & re-asses in AM)  On Site Evaluation by:   Reviewed with Physician:    Ralph Dowdy 01/24/2018 11:16 PM

## 2018-01-24 NOTE — ED Triage Notes (Signed)
Patient came to ER for increasing SI. Patient has not been taking his medications and has been drinking heavily and using cocaine and Heroin.

## 2018-01-24 NOTE — ED Notes (Signed)
Per TTS, pt to be evaluated again in the AM.

## 2018-01-24 NOTE — ED Notes (Signed)
Pt moved to unit, pt informed he was being discharged from hospital. Pt then became irate, pushed bedside table at this nurse, yelling, screaming, cussing,verbally abusive. This nurse handed pt his discharge summary, pt uninterested, threw paper back at this nurse. Ambulatory off unit.  Security and [employer] present.

## 2018-01-24 NOTE — BH Assessment (Addendum)
Attempted to make contact with pt to conduct TTS Assessment but the call through the Tele-Assessment was not answered.

## 2018-01-24 NOTE — ED Provider Notes (Signed)
[facility] EMERGENCY DEPARTMENT Provider Note   CSN: [id number] Arrival date & time: 01/24/18  1422     History   Chief Complaint Chief Complaint  Patient presents with  . Suicidal    HPI RAFORD BRISSETT is a 42 y.o. male.  HPI  42 year old male with a history of alcohol abuse, cirrhosis, depression and bipolar disorder presents with depression and suicidal thoughts.  He states these have been going on about a week.  He was at [facility] earlier in the day and he states he was told to leave by the psychiatrist.  He states he does not feel safe at home.  His plan is to jump off a bridge.  He is also been hearing voices.  He was recently admitted and discharged to a psychiatric hospital but has not been taking medicine since.  He denies feeling ill otherwise including no fevers, cough, abdominal pain, chest pain.  Most recently drank alcohol this morning and states he drinks about 24 beers a day.  He also endorses cocaine and marijuana use, most recently yesterday morning.  Past Medical History:  Diagnosis Date  . Bipolar disorder (HCC)   . Cellulitis   . Depression   . Drug abuse (HCC)   . Hepatitis C     Patient Active Problem List   Diagnosis Date Noted  . Alcohol use disorder, severe, dependence (HCC)   . 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 ETOH abuse   . Cirrhosis (HCC) 05/24/2015  . Suicidal ideation   . Recurrent cellulitis of lower leg 11/09/2014  . Venous (peripheral) insufficiency 11/09/2014  . Bipolar affective disorder (HCC)   . Alcohol dependence with alcohol-induced mood disorder (HCC)   . Opioid dependence with opioid-induced mood disorder (HCC)   . MDD (major depressive disorder) 09/09/2014  . Suicidal ideations   . Chronic pain syndrome 05/31/2014  . Tobacco dependence 05/30/2014  . Poor dentition 02/03/2014  . Hepatitis C virus infection without hepatic coma 02/03/2014  . Sepsis  (HCC) 01/29/2014  . Anxiety state, unspecified 01/31/2013  . Mood disorder in conditions classified elsewhere 01/31/2013  . Polysubstance (including opioids) dependence with physiological dependence (HCC) 01/27/2013    Class: Acute  . Alcohol withdrawal (HCC) 01/27/2013    Class: Acute    Past Surgical History:  Procedure Laterality Date  . BACK SURGERY    . ROTATOR CUFF REPAIR    . SHOULDER SURGERY          Home Medications    Prior to Admission medications   Medication Sig Start Date End Date Taking? Authorizing Provider  cloNIDine (CATAPRES) 0.1 MG tablet Take 0.1 mg by mouth 2 (two) times daily.   Yes [provider]  gabapentin (NEURONTIN) 800 MG tablet Take 800 mg by mouth 3 (three) times daily. 01/15/18  Yes [provider]  thiamine 100 MG tablet Take 1 tablet (100 mg total) by mouth daily. For thiamine replacement 06/23/17  Yes Sanjuana Kava, NP    Family History Family History  Problem Relation Age of Onset  . Alcohol abuse Father   . Cancer Maternal Aunt     Social History Social History   Tobacco Use  . Smoking status: Current Every Day Smoker    Packs/day: 1.00    Types: Cigarettes    Start date: 10/22/1981  . Smokeless tobacco: Never Used  Substance Use Topics  . Alcohol use: Yes    Alcohol/week: 7.2 oz  Types: 12 Cans of beer per week    Comment: heavy  . Drug use: Yes    Types: Marijuana, Benzodiazepines, Other-see comments, Methamphetamines, Cocaine     Allergies   Lithium and Penicillins   Review of Systems Review of Systems  Constitutional: Negative for fever.  Respiratory: Negative for shortness of breath.   Cardiovascular: Negative for chest pain.  Gastrointestinal: Negative for abdominal pain and vomiting.  Psychiatric/Behavioral: Positive for hallucinations and suicidal ideas.  All other systems reviewed and are negative.    Physical Exam Updated Vital Signs BP 134/86 (BP Location: Right Arm)   Pulse 86    Temp 98 F (36.7 C)   Resp 18   Ht 5\' 10"  (1.778 m)   Wt 95.3 kg (210 lb)   SpO2 97%   BMI 30.13 kg/m   Physical Exam  Constitutional: He is oriented to person, place, and time. He appears well-developed and well-nourished. No distress.  Resting comfortably, no acute distress  HENT:  Head: Normocephalic and atraumatic.  Right Ear: External ear normal.  Left Ear: External ear normal.  Nose: Nose normal.  Eyes: Right eye exhibits no discharge. Left eye exhibits no discharge.  Neck: Neck supple.  Cardiovascular: Normal rate, regular rhythm and normal heart sounds.  Pulmonary/Chest: Effort normal and breath sounds normal.  Abdominal: Soft. He exhibits no distension. There is no tenderness.  Musculoskeletal: He exhibits no edema.  Neurological: He is alert and oriented to person, place, and time.  Skin: Skin is warm and dry. He is not diaphoretic.  Psychiatric: He expresses suicidal ideation.  Nursing note and vitals reviewed.    ED Treatments / Results  Labs (all labs ordered are listed, but only abnormal results are displayed) Labs Reviewed  COMPREHENSIVE METABOLIC PANEL - Abnormal; Notable for the following components:      Result Value   Glucose, Bld 104 (*)    All other components within normal limits  ACETAMINOPHEN LEVEL - Abnormal; Notable for the following components:   Acetaminophen (Tylenol), Serum <10 (*)    All other components within normal limits  CBC - Abnormal; Notable for the following components:   WBC 17.1 (*)    All other components within normal limits  RAPID URINE DRUG SCREEN, HOSP PERFORMED - Abnormal; Notable for the following components:   Cocaine POSITIVE (*)    Tetrahydrocannabinol POSITIVE (*)    All other components within normal limits  ETHANOL  SALICYLATE LEVEL  CBG MONITORING, ED    EKG None  Radiology No results found.  Procedures Procedures (including critical care time)  Medications Ordered in ED Medications  LORazepam  (ATIVAN) injection 0-4 mg ( Intravenous See Alternative 01/24/18 2103)    Or  LORazepam (ATIVAN) tablet 0-4 mg (2 mg Oral Given 01/24/18 2103)  LORazepam (ATIVAN) injection 0-4 mg (has no administration in time range)    Or  LORazepam (ATIVAN) tablet 0-4 mg (has no administration in time range)  thiamine (VITAMIN B-1) tablet 100 mg (100 mg Oral Given 01/24/18 2103)    Or  thiamine (B-1) injection 100 mg ( Intravenous See Alternative 01/24/18 2103)  ondansetron (ZOFRAN) tablet 4 mg (has no administration in time range)  ibuprofen (ADVIL,MOTRIN) tablet 600 mg (600 mg Oral Given 01/24/18 2245)  nicotine (NICODERM CQ - dosed in mg/24 hours) patch 21 mg (21 mg Transdermal Patch Applied 01/24/18 2103)  alum & mag hydroxide-simeth (MAALOX/MYLANTA) 200-200-20 MG/5ML suspension 30 mL (has no administration in time range)     Initial Impression /  Assessment and Plan / ED Course  I have reviewed the triage vital signs and the nursing notes.  Pertinent labs & imaging results that were available during my care of the patient were reviewed by me and considered in my medical decision making (see chart for details).     Patient will need repeat TTS as the first did not seem like a full assessment. He is still suicidal. However VSS. Elevated WBC of unclear etiology but no infectious/inflammatory symptoms. Medically stable for psych disposition.  Final Clinical Impressions(s) / ED Diagnoses   Final diagnoses:  None    ED Discharge Orders    None       Pricilla LovelessGoldston, Saisha Hogue, MD 01/24/18 2251

## 2018-01-24 NOTE — ED Triage Notes (Addendum)
Pt endorses suicidal thoughts and hearing voices "telling me to lay down on a track track" Pt states that a month ago "I took a bunch of BP medications to try and kill myself but was not seen" Pt endorses using cocaine, weed and alcohol. Not taking psych meds. VSS. Cooperative. Pt went to [facility] earlier today and left stating "they told me to leave"

## 2018-01-24 NOTE — ED Notes (Signed)
Pt belongings placed in bags and placed at pod A nurses station.

## 2018-01-24 NOTE — Patient Outreach (Deleted)
CPSS met with the patient and provided substance use recovery support. CPSS provided substance use recovery resources with NA/AA meeting list, residential/outpatient substance use treatment center list, and CPSS contact information. Patient has had multiple ED admissions and has trouble following up with resources after being released from the hospital. CPSS strongly suggested for the patient to attend a recovery support group meeting like NA/AA in order to receive support from other people who are in recovery. CPSS encouraged the patient to contact CPSS at anytime for substance use recovery support or help with those resources.    

## 2018-01-24 NOTE — ED Provider Notes (Signed)
[facility]-EMERGENCY DEPT Provider Note   CSN: [id number] Arrival date & time: 01/24/18  4098     History   Chief Complaint No chief complaint on file.   HPI [name] [name] is a 42 y.o. male.  HPI [name] [name] is a 42 y.o. male with hx of bipolar disorder, polysubstance abuse, presents to emergency department with complaint of suicidal thoughts.  Patient states he has been feeling suicidal for a week, states today he was going to walk out and laid down on the train tracks.  He decided to come to the ED for help instead.  He reports that his psychiatric medications are not working so he stopped taking them.  He continues to do cocaine and drink heavy alcohol.  He denies homicidal ideations.  He denies any medical complaints at this time.  History of visits for the same in the past.  Past Medical History:  Diagnosis Date  . Bipolar disorder (HCC)   . Cellulitis   . Depression   . Drug abuse (HCC)   . Hepatitis C     Patient Active Problem List   Diagnosis Date Noted  . Alcohol use disorder, severe, dependence (HCC)   . 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 ETOH abuse   . Cirrhosis (HCC) 05/24/2015  . Suicidal ideation   . Recurrent cellulitis of lower leg 11/09/2014  . Venous (peripheral) insufficiency 11/09/2014  . Bipolar affective disorder (HCC)   . Alcohol dependence with alcohol-induced mood disorder (HCC)   . Opioid dependence with opioid-induced mood disorder (HCC)   . Severe recurrent major depression without psychotic features (HCC) 09/09/2014  . MDD (major depressive disorder) 09/09/2014  . Suicidal ideations   . Chronic pain syndrome 05/31/2014  . Tobacco dependence 05/30/2014  . Poor dentition 02/03/2014  . Hepatitis C virus infection without hepatic coma 02/03/2014  . Sepsis (HCC) 01/29/2014  . Anxiety state, unspecified 01/31/2013  . Mood disorder in conditions classified elsewhere  01/31/2013  . Polysubstance (including opioids) dependence with physiological dependence (HCC) 01/27/2013    Class: Acute  . Alcohol withdrawal (HCC) 01/27/2013    Class: Acute    Past Surgical History:  Procedure Laterality Date  . BACK SURGERY    . ROTATOR CUFF REPAIR    . SHOULDER SURGERY          Home Medications    Prior to Admission medications   Medication Sig Start Date End Date Taking? Authorizing Provider  Buprenorphine HCl-Naloxone HCl (SUBOXONE) 8-2 MG FILM Place 1 Film under the tongue 3 (three) times daily.    Yes [provider]  clonazePAM (KLONOPIN) 1 MG tablet Take 1 mg by mouth 2 (two) times daily.   Yes [provider]  cloNIDine (CATAPRES) 0.1 MG tablet Take 0.1 mg by mouth daily. 01/16/18  Yes [provider]  gabapentin (NEURONTIN) 800 MG tablet Take 800 mg by mouth 3 (three) times daily. 01/15/18  Yes [provider]  PARoxetine (PAXIL) 30 MG tablet Take 30 mg by mouth daily after breakfast. 12/10/17  Yes [provider]  prazosin (MINIPRESS) 5 MG capsule Take 5 mg by mouth at bedtime. 12/10/17  Yes [provider]  temazepam (RESTORIL) 15 MG capsule Take 15 mg by mouth at bedtime. 12/10/17  Yes [provider]  FLUoxetine (PROZAC) 40 MG capsule Take 1 capsule (40 mg total) by mouth daily. For depression Patient not taking: Reported on 12/25/2017 06/23/17   Armandina Stammer  I, NP  furosemide (LASIX) 40 MG tablet Take 1 tablet (40 mg total) by mouth daily. Patient not taking: Reported on 12/25/2017 07/01/17   Bing Neighbors, FNP  hydrOXYzine (ATARAX/VISTARIL) 25 MG tablet Take 1 tablet (25 mg total) by mouth every 6 (six) hours as needed for anxiety. Patient not taking: Reported on 07/01/2017 06/22/17   Armandina Stammer I, NP  Multiple Vitamin (MULTIVITAMIN WITH MINERALS) TABS tablet Take 1 tablet by mouth daily. For low Vitamin Patient not taking: Reported on 12/25/2017 06/22/17   Armandina Stammer I, NP  mupirocin  ointment (BACTROBAN) 2 % Apply 1 application topically 3 (three) times daily. Patient not taking: Reported on 12/25/2017 07/01/17   Bing Neighbors, FNP  potassium chloride SA (K-DUR,KLOR-CON) 20 MEQ tablet Take 1 tablet (20 mEq total) by mouth daily. Patient not taking: Reported on 12/25/2017 07/01/17   Bing Neighbors, FNP  prazosin (MINIPRESS) 2 MG capsule Take 2 capsules (4 mg total) by mouth at bedtime. For nightmares Patient not taking: Reported on 12/25/2017 06/22/17   Armandina Stammer I, NP  QUEtiapine (SEROQUEL) 300 MG tablet Take 1 tablet (300 mg total) by mouth at bedtime. For mood control Patient not taking: Reported on 12/25/2017 06/22/17   Armandina Stammer I, NP  thiamine 100 MG tablet Take 1 tablet (100 mg total) by mouth daily. For thiamine replacement Patient not taking: Reported on 12/25/2017 06/23/17   Sanjuana Kava, NP    Family History Family History  Problem Relation Age of Onset  . Alcohol abuse Father   . Cancer Maternal Aunt     Social History Social History   Tobacco Use  . Smoking status: Current Every Day Smoker    Packs/day: 1.00    Types: Cigarettes    Start date: 10/22/1981  . Smokeless tobacco: Never Used  Substance Use Topics  . Alcohol use: Yes    Alcohol/week: 7.2 oz    Types: 12 Cans of beer per week    Comment: heavy  . Drug use: Yes    Types: Marijuana, Benzodiazepines, Other-see comments, Methamphetamines, Cocaine     Allergies   Lithium and Penicillins   Review of Systems Review of Systems  Constitutional: Negative for chills and fever.  Respiratory: Negative for cough, chest tightness and shortness of breath.   Cardiovascular: Negative for chest pain, palpitations and leg swelling.  Gastrointestinal: Negative for abdominal distention, abdominal pain, diarrhea, nausea and vomiting.  Genitourinary: Negative for dysuria, frequency, hematuria and urgency.  Musculoskeletal: Negative for arthralgias, myalgias, neck pain and neck stiffness.  Skin:  Negative for rash.  Allergic/Immunologic: Negative for immunocompromised state.  Neurological: Negative for dizziness, weakness, light-headedness, numbness and headaches.  Psychiatric/Behavioral: Positive for dysphoric mood and suicidal ideas. The patient is nervous/anxious.   All other systems reviewed and are negative.    Physical Exam Updated Vital Signs BP 131/80 (BP Location: Right Arm)   Pulse 88   Temp 98.3 F (36.8 C) (Oral)   Resp 18   Ht 5\' 10"  (1.778 m)   Wt 95.3 kg (210 lb)   SpO2 100%   BMI 30.13 kg/m   Physical Exam  Constitutional: He is oriented to person, place, and time. He appears well-developed and well-nourished. No distress.  HENT:  Head: Normocephalic and atraumatic.  Eyes: Conjunctivae are normal.  Neck: Neck supple.  Cardiovascular: Normal rate, regular rhythm and normal heart sounds.  Pulmonary/Chest: Effort normal. No respiratory distress. He has no wheezes. He has no rales.  Abdominal: Soft. Bowel sounds  are normal. He exhibits no distension. There is no tenderness. There is no rebound.  Musculoskeletal: He exhibits no edema.  Neurological: He is alert and oriented to person, place, and time.  Skin: Skin is warm and dry.  Psychiatric:  Suicidal. Flat affect  Nursing note and vitals reviewed.    ED Treatments / Results  Labs (all labs ordered are listed, but only abnormal results are displayed) Labs Reviewed  CBC - Abnormal; Notable for the following components:      Result Value   WBC 15.7 (*)    All other components within normal limits  COMPREHENSIVE METABOLIC PANEL  ETHANOL  SALICYLATE LEVEL  ACETAMINOPHEN LEVEL  RAPID URINE DRUG SCREEN, HOSP PERFORMED    EKG None  Radiology No results found.  Procedures Procedures (including critical care time)  Medications Ordered in ED Medications - No data to display   Initial Impression / Assessment and Plan / ED Course  I have reviewed the triage vital signs and the nursing  notes.  Pertinent labs & imaging results that were available during my care of the patient were reviewed by me and considered in my medical decision making (see chart for details).     Patient in emergency department with suicidal thoughts, plan to lay on the tracks.  Will check labs. Will monitor  11:45 AM White blood cell count is elevated, unclear etiology.  Patient is in no acute distress, vital signs are all within normal, labs otherwise unremarkable.  Patient is medically cleared.  I will place him on CIWA protocol and get TTS consult.  Final Clinical Impressions(s) / ED Diagnoses   Final diagnoses:  None    ED Discharge Orders    None       Jaynie CrumbleKirichenko, Airica Schwartzkopf, PA-C 01/24/18 1432    Mancel BaleWentz, Elliott, MD 01/24/18 1544

## 2018-01-24 NOTE — Progress Notes (Addendum)
Patient was seen by this nurse practitioner and asked him about his follow-up from [facility] he discharged about 3 weeks ago.  He did not remember.  Discussed his care and the importance of follow-up for continuity of care.  Plan was for him to meet with [facility] for resources for rehab and recovery.  However, he became belligerent and started cursing while throwing things.  Escorted out by police due to his threats to staff.  Caveat:  [facility] he was there that he needed to stay in the hospital until his court date.  Not vested in treatment at this time, unfortunately.  Nanine MeansJamison Gerrell Tabet, PMHNP

## 2018-01-24 NOTE — ED Notes (Signed)
Belongings placed in locker 27.  Large [employer] Fatigue bag in Dayroom in Acute [phone]([phone] - psych ED).

## 2018-01-24 NOTE — ED Notes (Signed)
TTS in progress 

## 2018-01-25 ENCOUNTER — Other Ambulatory Visit: Payer: Self-pay

## 2018-01-25 DIAGNOSIS — F329 Major depressive disorder, single episode, unspecified: Secondary | ICD-10-CM | POA: Diagnosis not present

## 2018-01-25 NOTE — ED Notes (Signed)
Pt noted w/no tremors. Pt ambulatory to nurses' desk asking for med for detox - pt appears to be shaking right hand possibly intentionally. Advised pt may administer Ativan for anxiety but is not meeting CIWA criteria at this time

## 2018-01-25 NOTE — ED Notes (Signed)
Re-TTS completed. [facility], NP, [facility], recommending for Dr Madilyn Hookees to d/c CIWA Protocol and continuing to seek inpt d/t pt continues to endorse SI.

## 2018-01-25 NOTE — ED Notes (Signed)
Pt was able to place medicine cup and cup of water to mouth w/o any tremors noted when took his med.

## 2018-01-25 NOTE — ED Notes (Addendum)
Pt aware of tx plan - inpt and that CIWA w/Ativan has been d/c'd. Voiced understanding. Pt asking for Seroquel 200mg  in am and 300mg  pm for c/o AH - states last took 2 days ago - [facility], pt had advised has been off med x 1 month. Per Shuvon, NP, [facility] - no change in med recommendations at this time.

## 2018-01-25 NOTE — ED Notes (Signed)
Patient denies pain and is resting comfortably.  

## 2018-01-25 NOTE — ED Notes (Signed)
Pt aware NP, [facility], advised not adding new meds. Pt asked for Vistaril - advised no new meds being added.

## 2018-01-25 NOTE — BH Assessment (Signed)
Patient seen by Assunta FoundShuvon Rankin NP with writer. Pt continues to endorse SI. He says if TTS discharged him, patient will kill himself. Pt says he was unable to go to outpatient at [facility], but he is unable to give reason. Pt says, "I need to go to the hospital and get stabilized." Rankin NP talks with patient about importance of patient being vested in his mental health treatment in order to have best outcome possible. Patient says he will follow through with outpatient resources if he is admitted to an inpatient MH facility this time. Shuvon Rankin NP called and spoke w/ EDP Madilyn Hookees and recommends stopping Ativan protocol. Pt did not have etoh on board for this admission.  Rankin NP recommends inpatient placement for patient.  Evette Cristalaroline Paige Kynadie Yaun, Kentucky[place]CSW Therapeutic Triage Specialist

## 2018-01-25 NOTE — ED Notes (Signed)
Pt ambulated to bathroom and back to room w/o difficulty - steady gait noted. Pt noted to be lying on bed, alert. No tremors/shaking noted until pt asked "Can I get more medicine because I feel like things are crawling on me?" Pt then removed blanket from his arms/hands and began wringing hands together and shaking left foot. When pt asked if nauseated, states yes - Pt ate 100% of breakfast.

## 2018-01-25 NOTE — ED Notes (Signed)
Added shirt, underwear, and socks to pt's belongings in [facility].

## 2018-01-25 NOTE — ED Notes (Signed)
Pt ambulated to restroom and back to room w/o difficulty. Steady gait noted.

## 2018-01-26 DIAGNOSIS — F329 Major depressive disorder, single episode, unspecified: Secondary | ICD-10-CM | POA: Diagnosis not present

## 2018-01-26 MED ORDER — GABAPENTIN 100 MG PO CAPS
200.0000 mg | ORAL_CAPSULE | Freq: Three times a day (TID) | ORAL | Status: DC
  Administered 2018-01-26 – 2018-01-27 (×2): 200 mg via ORAL
  Filled 2018-01-26 (×2): qty 2

## 2018-01-26 MED ORDER — QUETIAPINE FUMARATE 50 MG PO TABS
50.0000 mg | ORAL_TABLET | Freq: Two times a day (BID) | ORAL | Status: DC
  Administered 2018-01-26 – 2018-01-27 (×2): 50 mg via ORAL
  Filled 2018-01-26 (×2): qty 1

## 2018-01-26 MED ORDER — HYDROXYZINE HCL 25 MG PO TABS
25.0000 mg | ORAL_TABLET | Freq: Four times a day (QID) | ORAL | Status: DC | PRN
  Administered 2018-01-26: 25 mg via ORAL
  Filled 2018-01-26: qty 1

## 2018-01-26 NOTE — BHH Counselor (Signed)
Clinician recieved a call from [place] at [facility] expressing pt is still on their waiting list. [place]any reported, based on pending discharges the pt may have a bed tomorrow, she will follow if there is availability.    [name] Pullingreylese D Nahiem Dredge, MS, [facility], [facility] Triage Specialist [phone]

## 2018-01-26 NOTE — Progress Notes (Signed)
Chart reviewed and case discussed with TTS. Patient is currently being reviewed by multiple agencies for inpatient admission.  Will start Vistaril 25 mg PO every 6 hours prn for anxiety/agitation, Neurontin 200 mg PO TID for anxiety/alcohol withdrawal, and Seroquel 50 mg PO BID for psychosis.

## 2018-01-26 NOTE — Progress Notes (Signed)
Referral information has been sent to the following hospitals: [facility]  [facility]  [facility]  [facility]  [facility]  [facility]  Disposition will continue to assist with placement needs.   Wells GuilesSarah Rachit Grim, LCSW, LCAS Disposition CSW [facility]/TTS [phone]

## 2018-01-26 NOTE — ED Notes (Signed)
Pt politely asked for snacks. Pt given [employer] Crackers, Peanut Butter and a Caffeine Free Coke.

## 2018-01-26 NOTE — ED Notes (Signed)
Pt ate lunch. Pt states he is agitated d/t "I've been stuck in this room for 3 fucking days and y'all aren't helping me!" States he is tired of being here and "there's no need in them talking to me if they're not going to help me because I'm fucking withdrawing, man, from alcohol and other drugs!". Advised pt he may discuss when [facility] Counselor/NP talks w/him today. Offered for pt to read a book, magazine, or color - declined.

## 2018-01-26 NOTE — ED Notes (Signed)
Patient denies pain and is resting comfortably.  

## 2018-01-26 NOTE — ED Notes (Signed)
Breakfast tray ordered 

## 2018-01-26 NOTE — BH Assessment (Signed)
RE-ASSESSMENT  Pt reports he does not feel well today. He says he feels very anxious and "like I'm crawling out of my skin." He says he believes he is experiencing alcohol withdrawal and says he is currently experiencing symptoms including the feeling "bugs crawling over me", tremors and sensitivity to light. He reports current auditory hallucinations of voices telling him to kill himself. He reports current suicidal ideation with plan to jump from a bridge.   TTS explained to Pt that staff is still seeking placement at a psychiatric facility and listed the facilities contacted. Offer support and answered Pt's question.  Gave clinical report to Nira ConnJason Berry, NP who said Pt continues to meet criteria for inpatient psychiatric treatment. He reviewed Pt's medical record and said he would add orders for medication. Notified Dr. Arby BarretteMarcy Pfeiffer and Jory Simsandy Peters, RN of recommendation.   Harlin RainFord Ellis Patsy BaltimoreWarrick Jr, LPC, [facility], [facility] Triage Specialist [phone]

## 2018-01-26 NOTE — ED Notes (Signed)
Pt out of room yelling "you guys aren't doing shit for me" "If you aren't going to do anything, just let me go so I can go kill myself". "I've been here in this room for days and no one has talked to me". "I need something to make these voices go away". Pt loud and cursing. Ask pt to go back to his room, which he did, but cursing all the way". [facility] call [facility] and see if someone can speak with him now.

## 2018-01-26 NOTE — ED Notes (Signed)
Received call from [facility] that pt has been accepted. Can call report and pt come after 0900 tomorrow am. Pt made aware and seems to be content. Accepting MD Erling Conte, [facility], Call report at [phone].

## 2018-01-26 NOTE — ED Notes (Signed)
[facility]. Computer moved into room. Pt cooperative at this time.

## 2018-01-27 ENCOUNTER — Encounter (HOSPITAL_COMMUNITY): Payer: Self-pay | Admitting: Emergency Medicine

## 2018-01-27 DIAGNOSIS — F329 Major depressive disorder, single episode, unspecified: Secondary | ICD-10-CM | POA: Diagnosis not present

## 2018-01-27 NOTE — ED Notes (Signed)
Breakfast tray ordered 

## 2018-01-27 NOTE — ED Notes (Signed)
[place] transportation contacted for transportation [facility].

## 2018-07-30 DIAGNOSIS — F1121 Opioid dependence, in remission: Secondary | ICD-10-CM | POA: Insufficient documentation

## 2019-02-21 DIAGNOSIS — F1220 Cannabis dependence, uncomplicated: Secondary | ICD-10-CM | POA: Insufficient documentation

## 2019-06-19 DIAGNOSIS — J690 Pneumonitis due to inhalation of food and vomit: Secondary | ICD-10-CM | POA: Insufficient documentation

## 2019-06-19 DIAGNOSIS — R4182 Altered mental status, unspecified: Secondary | ICD-10-CM | POA: Insufficient documentation

## 2021-03-31 DIAGNOSIS — Z765 Malingerer [conscious simulation]: Secondary | ICD-10-CM

## 2021-04-03 ENCOUNTER — Emergency Department (HOSPITAL_COMMUNITY)
Admission: EM | Admit: 2021-04-03 | Discharge: 2021-04-04 | Disposition: A | Discharge status: Other Acute Inpatient Hospital | Payer: Medicare HMO | Attending: Emergency Medicine | Admitting: Emergency Medicine

## 2021-04-03 DIAGNOSIS — R443 Hallucinations, unspecified: Secondary | ICD-10-CM | POA: Diagnosis not present

## 2021-04-03 DIAGNOSIS — Y900 Blood alcohol level of less than 20 mg/100 ml: Secondary | ICD-10-CM | POA: Insufficient documentation

## 2021-04-03 DIAGNOSIS — R45851 Suicidal ideations: Secondary | ICD-10-CM | POA: Diagnosis not present

## 2021-04-03 DIAGNOSIS — Z20822 Contact with and (suspected) exposure to covid-19: Secondary | ICD-10-CM | POA: Insufficient documentation

## 2021-04-03 DIAGNOSIS — F323 Major depressive disorder, single episode, severe with psychotic features: Secondary | ICD-10-CM

## 2021-04-03 DIAGNOSIS — F329 Major depressive disorder, single episode, unspecified: Secondary | ICD-10-CM | POA: Insufficient documentation

## 2021-04-03 DIAGNOSIS — F17210 Nicotine dependence, cigarettes, uncomplicated: Secondary | ICD-10-CM | POA: Insufficient documentation

## 2021-04-03 LAB — CBC WITH DIFFERENTIAL/PLATELET
Abs Immature Granulocytes: 0.05 10*3/uL (ref 0.00–0.07)
Basophils Absolute: 0.1 10*3/uL (ref 0.0–0.1)
Basophils Relative: 0 %
Eosinophils Absolute: 0.6 10*3/uL — ABNORMAL HIGH (ref 0.0–0.5)
Eosinophils Relative: 3 %
HCT: 43.4 % (ref 39.0–52.0)
Hemoglobin: 14.8 g/dL (ref 13.0–17.0)
Immature Granulocytes: 0 %
Lymphocytes Relative: 31 %
Lymphs Abs: 5.3 10*3/uL — ABNORMAL HIGH (ref 0.7–4.0)
MCH: 31 pg (ref 26.0–34.0)
MCHC: 34.1 g/dL (ref 30.0–36.0)
MCV: 91 fL (ref 80.0–100.0)
Monocytes Absolute: 1.9 10*3/uL — ABNORMAL HIGH (ref 0.1–1.0)
Monocytes Relative: 11 %
Neutro Abs: 9 10*3/uL — ABNORMAL HIGH (ref 1.7–7.7)
Neutrophils Relative %: 55 %
Platelets: 338 10*3/uL (ref 150–400)
RBC: 4.77 MIL/uL (ref 4.22–5.81)
RDW: 13 % (ref 11.5–15.5)
WBC: 16.9 10*3/uL — ABNORMAL HIGH (ref 4.0–10.5)
nRBC: 0 % (ref 0.0–0.2)

## 2021-04-03 LAB — COMPREHENSIVE METABOLIC PANEL
ALT: 36 U/L (ref 0–44)
AST: 32 U/L (ref 15–41)
Albumin: 4.7 g/dL (ref 3.5–5.0)
Alkaline Phosphatase: 56 U/L (ref 38–126)
Anion gap: 9 (ref 5–15)
BUN: 18 mg/dL (ref 6–20)
CO2: 25 mmol/L (ref 22–32)
Calcium: 9.6 mg/dL (ref 8.9–10.3)
Chloride: 102 mmol/L (ref 98–111)
Creatinine, Ser: 0.72 mg/dL (ref 0.61–1.24)
GFR, Estimated: >60 mL/min (ref >60)
Glucose, Bld: 106 mg/dL — ABNORMAL HIGH (ref 70–99)
Potassium: 3.7 mmol/L (ref 3.5–5.1)
Sodium: 136 mmol/L (ref 135–145)
Total Bilirubin: 1.1 mg/dL (ref 0.3–1.2)
Total Protein: 8.1 g/dL (ref 6.5–8.1)

## 2021-04-03 LAB — SALICYLATE LEVEL: Salicylate Lvl: <7 mg/dL — ABNORMAL LOW (ref 7.0–30.0)

## 2021-04-03 LAB — ACETAMINOPHEN LEVEL: Acetaminophen (Tylenol), Serum: <10 ug/mL — ABNORMAL LOW (ref 10–30)

## 2021-04-03 LAB — ETHANOL: Alcohol, Ethyl (B): <10 mg/dL (ref <10)

## 2021-04-03 NOTE — ED Triage Notes (Signed)
Pt came in with c/o SI and hallucinations. Denies drug or alcohol use. Pt is continuously speaking to himself. Pt states he plans to hang himself

## 2021-04-03 NOTE — ED Provider Notes (Signed)
[facility]-EMER[facility]PT Provider Note   CSN: [id number] Arrival date & time: 04/03/21  2059     History Chief Complaint  Patient presents with   Suicidal   Hallucinations    [name] [name] is a 45 y.o. male with a past medical history of bipolar, drug abuse, hepatitis C, suicidal ideation, polysubstance abuse who presents today for evaluation of SI, HI, and AVH.  He states that he wants to kill himself and that he has weapons stashed away in the woods to do this. He states that he hasn't been taking his psych meds.  He states that the voiced are making him paranoid.    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   ETOH abuse    Cirrhosis (HCC) 05/24/2015   Suicidal ideation    Recurrent cellulitis of lower leg 11/09/2014   Venous (peripheral) insufficiency 11/09/2014   Bipolar affective disorder (HCC)    Alcohol dependence with alcohol-induced mood disorder (HCC)    Opioid dependence with opioid-induced mood disorder (HCC)    MDD (major depressive disorder) 09/09/2014   Suicidal ideations    Chronic pain syndrome 05/31/2014   Tobacco dependence 05/30/2014   Poor dentition 02/03/2014   Hepatitis C virus infection without hepatic coma 02/03/2014   Sepsis (HCC) 01/29/2014   Anxiety state, unspecified 01/31/2013   Mood disorder in conditions classified elsewhere 01/31/2013   Polysubstance (including opioids) dependence with physiological dependence (HCC) 01/27/2013    Class: Acute   Alcohol withdrawal (HCC) 01/27/2013    Class: Acute    Past Surgical History:  Procedure Laterality Date   BACK SURGERY     ROTATOR CUFF  REPAIR     SHOULDER SURGERY         Family History  Problem Relation Age of Onset   Alcohol abuse Father    Cancer Maternal Aunt     Social History   Tobacco Use   Smoking status: Every Day    Packs/day: 1.00    Pack years: 0.00    Types: Cigarettes    Start date: 10/22/1981   Smokeless tobacco: Never  Vaping Use   Vaping Use: Never used  Substance Use Topics   Alcohol use: Yes    Alcohol/week: 12.0 standard drinks    Types: 12 Cans of beer per week    Comment: heavy   Drug use: Yes    Types: Marijuana, Benzodiazepines, Other-see comments, Methamphetamines, Cocaine    Home Medications Prior to Admission medications   Not on File    Allergies    Lithium and Penicillins  Review of Systems   Review of Systems  Unable to perform ROS: Psychiatric disorder   Physical Exam Updated Vital Signs BP (!) 144/91 (BP Location: Left Arm)   Pulse 85   Temp 98.3 F (36.8 C) (Oral)   Resp 16   Ht 5\' 9"  (1.753 m)   Wt 77.1 kg   SpO2 98%   BMI 25.10 kg/m   Physical Exam Vitals and nursing note reviewed.  Constitutional:      General: He is not in acute distress.  Appearance: He is not diaphoretic.  HENT:     Head: Normocephalic and atraumatic.  Eyes:     General: No scleral icterus.       Right eye: No discharge.        Left eye: No discharge.     Conjunctiva/sclera: Conjunctivae normal.  Cardiovascular:     Rate and Rhythm: Normal rate and regular rhythm.  Pulmonary:     Effort: Pulmonary effort is normal. No respiratory distress.     Breath sounds: No stridor.  Abdominal:     General: There is no distension.  Musculoskeletal:        General: No deformity.     Cervical back: Normal range of motion.  Skin:    General: Skin is warm and dry.  Neurological:     Mental Status: He is alert.     Motor: No abnormal muscle tone.     Comments: Patient is awake and alert.  Speech is not slurred.  Normal gait.  Psychiatric:     Comments: Patient is anxious  Talking to himself and appears to be interacting with internal stimuli. He is paranoid about staying in the room he is then stating that the voices are telling him he cannot be there.    ED Results / Procedures / Treatments   Labs (all labs ordered are listed, but only abnormal results are displayed) Labs Reviewed  COMPREHENSIVE METABOLIC PANEL - Abnormal; Notable for the following components:      Result Value   Glucose, Bld 106 (*)    All other components within normal limits  CBC WITH DIFFERENTIAL/PLATELET - Abnormal; Notable for the following components:   WBC 16.9 (*)    Neutro Abs 9.0 (*)    Lymphs Abs 5.3 (*)    Monocytes Absolute 1.9 (*)    Eosinophils Absolute 0.6 (*)    All other components within normal limits  ACETAMINOPHEN LEVEL - Abnormal; Notable for the following components:   Acetaminophen (Tylenol), Serum <10 (*)    All other components within normal limits  SALICYLATE LEVEL - Abnormal; Notable for the following components:   Salicylate Lvl <7.0 (*)    All other components within normal limits  RESP PANEL BY RT-PCR (FLU A&B, COVID) ARPGX2  ETHANOL  RAPID URINE DRUG SCREEN, HOSP PERFORMED    EKG None  Radiology No results found.  Procedures Procedures   Medications Ordered in ED Medications - No data to display  ED Course  I have reviewed the triage vital signs and the nursing notes.  Pertinent labs & imaging results that were available during my care of the patient were reviewed by me and considered in my medical decision making (see chart for details).  Clinical Course as of 04/03/21 2353  Mon Apr 03, 2021  2335 IVC papers completed by Dr. Rubin Payor.   [EH]    Clinical Course User Index [EH] [name] Gong, PA-C   MDM Rules/Calculators/A&P                          [name] [name] is a 45 year old man who presents today for evaluation of SI with a plan to either hang himself or use weapons that he has stored in the woods.  He appears to  be hallucinating and responding to internal stimuli.  Patient has a leukocytosis on screening labs which appear to be at his baseline.  Ethanol, salicylates and acetaminophen are all undetected.  CMP is unremarkable. EKG  is pending.  At this point patient still awaiting UDS and COVID testing along with TTS evaluation. IVC papers have been completed.  Note: Portions of this report may have been transcribed using voice recognition software. Every effort was made to ensure accuracy; however, inadvertent computerized transcription errors may be present   Final Clinical Impression(s) / ED Diagnoses Final diagnoses:  Suicidal ideation  Hallucinations    Rx / DC Orders ED Discharge Orders     None        Norman Clay 04/03/21 2353    Benjiman Core, MD 04/03/21 2356

## 2021-04-03 NOTE — ED Provider Notes (Signed)
Emergency Medicine Provider Triage Evaluation Note  [name] [name] , a 45 y.o. male  was evaluated in triage.  Pt complains of SI, HI, AVH.  He states that he wants to kill him self.  He reports that he has weapons in the woods.  He states that he is hearing voices that tell him to do things.  He denies drug or alcohol use.    Review of Systems  Positive: SI, HI, AVH Negative: Abdominal pain, chest pain.   Physical Exam  BP (!) 144/91 (BP Location: Left Arm)   Pulse 85   Temp 98.3 F (36.8 C) (Oral)   Resp 16   Ht 5\' 9"  (1.753 m)   Wt 77.1 kg   SpO2 98%   BMI 25.10 kg/m  Gen:   Awake, no distress   Resp:  Normal effort  MSK:   Moves extremities without difficulty  Other:  Flat affect, appears to be responding to internal stimuli.   Medical Decision Making  Medically screening exam initiated at 10:51 PM.  Appropriate orders placed.  [name] [name] was informed that the remainder of the evaluation will be completed by another provider, this initial triage assessment does not replace that evaluation, and the importance of remaining in the ED until their evaluation is complete.     [name] [name] 04/03/21 2252    04/05/21, MD 04/03/21 2356

## 2021-04-04 ENCOUNTER — Inpatient Hospital Stay (HOSPITAL_COMMUNITY)
Admission: AD | Admit: 2021-04-04 | Discharge: 2021-04-09 | DRG: 885 | Disposition: A | Discharge status: Home / Self Care | Payer: Medicare HMO | Source: Intra-hospital | Attending: Psychiatry | Admitting: Psychiatry

## 2021-04-04 ENCOUNTER — Encounter (HOSPITAL_COMMUNITY): Payer: Self-pay | Admitting: Family Medicine

## 2021-04-04 ENCOUNTER — Other Ambulatory Visit: Payer: Self-pay

## 2021-04-04 DIAGNOSIS — G8929 Other chronic pain: Secondary | ICD-10-CM | POA: Diagnosis present

## 2021-04-04 DIAGNOSIS — F323 Major depressive disorder, single episode, severe with psychotic features: Secondary | ICD-10-CM | POA: Diagnosis present

## 2021-04-04 DIAGNOSIS — I10 Essential (primary) hypertension: Secondary | ICD-10-CM | POA: Diagnosis present

## 2021-04-04 DIAGNOSIS — Z880 Allergy status to penicillin: Secondary | ICD-10-CM

## 2021-04-04 DIAGNOSIS — F329 Major depressive disorder, single episode, unspecified: Secondary | ICD-10-CM | POA: Diagnosis not present

## 2021-04-04 DIAGNOSIS — F315 Bipolar disorder, current episode depressed, severe, with psychotic features: Secondary | ICD-10-CM | POA: Diagnosis present

## 2021-04-04 DIAGNOSIS — Z888 Allergy status to other drugs, medicaments and biological substances status: Secondary | ICD-10-CM | POA: Diagnosis not present

## 2021-04-04 DIAGNOSIS — F17210 Nicotine dependence, cigarettes, uncomplicated: Secondary | ICD-10-CM | POA: Diagnosis present

## 2021-04-04 DIAGNOSIS — R45851 Suicidal ideations: Secondary | ICD-10-CM | POA: Diagnosis present

## 2021-04-04 DIAGNOSIS — Z5900 Homelessness unspecified: Secondary | ICD-10-CM

## 2021-04-04 DIAGNOSIS — R058 Other specified cough: Secondary | ICD-10-CM

## 2021-04-04 DIAGNOSIS — F419 Anxiety disorder, unspecified: Secondary | ICD-10-CM | POA: Diagnosis present

## 2021-04-04 DIAGNOSIS — F515 Nightmare disorder: Secondary | ICD-10-CM | POA: Diagnosis present

## 2021-04-04 DIAGNOSIS — R000 Tachycardia, unspecified: Secondary | ICD-10-CM | POA: Diagnosis present

## 2021-04-04 DIAGNOSIS — F319 Bipolar disorder, unspecified: Secondary | ICD-10-CM | POA: Diagnosis present

## 2021-04-04 LAB — RAPID URINE DRUG SCREEN, HOSP PERFORMED
Amphetamines: NOT DETECTED
Barbiturates: NOT DETECTED
Benzodiazepines: NOT DETECTED
Cocaine: NOT DETECTED
Opiates: NOT DETECTED
Tetrahydrocannabinol: POSITIVE — AB

## 2021-04-04 LAB — CBG MONITORING, ED: Glucose-Capillary: 108 mg/dL — ABNORMAL HIGH (ref 70–99)

## 2021-04-04 LAB — RESP PANEL BY RT-PCR (FLU A&B, COVID) ARPGX2
Influenza A by PCR: NEGATIVE
Influenza B by PCR: NEGATIVE
SARS Coronavirus 2 by RT PCR: NEGATIVE

## 2021-04-04 MED ORDER — ALUM & MAG HYDROXIDE-SIMETH 200-200-20 MG/5ML PO SUSP
30.0000 mL | ORAL | Status: DC | PRN
  Administered 2021-04-05 – 2021-04-07 (×2): 30 mL via ORAL
  Filled 2021-04-04 (×2): qty 30

## 2021-04-04 MED ORDER — ACETAMINOPHEN 325 MG PO TABS
650.0000 mg | ORAL_TABLET | Freq: Four times a day (QID) | ORAL | Status: DC | PRN
  Administered 2021-04-04 – 2021-04-05 (×2): 650 mg via ORAL
  Filled 2021-04-04 (×3): qty 2

## 2021-04-04 MED ORDER — QUETIAPINE FUMARATE 50 MG PO TABS
50.0000 mg | ORAL_TABLET | Freq: Every day | ORAL | Status: DC
  Administered 2021-04-04 – 2021-04-05 (×2): 50 mg via ORAL
  Filled 2021-04-04 (×6): qty 1

## 2021-04-04 MED ORDER — MAGNESIUM HYDROXIDE 400 MG/5ML PO SUSP
30.0000 mL | Freq: Every day | ORAL | Status: DC | PRN
  Administered 2021-04-05 – 2021-04-07 (×2): 30 mL via ORAL
  Filled 2021-04-04 (×2): qty 30

## 2021-04-04 MED ORDER — TRAZODONE HCL 50 MG PO TABS
50.0000 mg | ORAL_TABLET | Freq: Every evening | ORAL | Status: DC | PRN
  Administered 2021-04-04 – 2021-04-07 (×2): 50 mg via ORAL
  Filled 2021-04-04 (×3): qty 1

## 2021-04-04 MED ORDER — GABAPENTIN 400 MG PO CAPS
800.0000 mg | ORAL_CAPSULE | Freq: Three times a day (TID) | ORAL | Status: DC
  Administered 2021-04-04 – 2021-04-09 (×15): 800 mg via ORAL
  Filled 2021-04-04 (×26): qty 2

## 2021-04-04 MED ORDER — SERTRALINE HCL 100 MG PO TABS
100.0000 mg | ORAL_TABLET | Freq: Every day | ORAL | Status: DC
  Administered 2021-04-04 – 2021-04-09 (×6): 100 mg via ORAL
  Filled 2021-04-04 (×6): qty 1
  Filled 2021-04-04: qty 2
  Filled 2021-04-04 (×4): qty 1

## 2021-04-04 MED ORDER — FUROSEMIDE 20 MG PO TABS
20.0000 mg | ORAL_TABLET | Freq: Two times a day (BID) | ORAL | Status: DC
  Administered 2021-04-04 – 2021-04-09 (×10): 20 mg via ORAL
  Filled 2021-04-04 (×19): qty 1

## 2021-04-04 MED ORDER — PRAZOSIN HCL 1 MG PO CAPS
1.0000 mg | ORAL_CAPSULE | Freq: Every day | ORAL | Status: DC
  Administered 2021-04-04 – 2021-04-07 (×4): 1 mg via ORAL
  Filled 2021-04-04 (×8): qty 1

## 2021-04-04 MED ORDER — BUPROPION HCL ER (XL) 150 MG PO TB24
150.0000 mg | ORAL_TABLET | Freq: Every day | ORAL | Status: DC
  Administered 2021-04-04: 150 mg via ORAL
  Filled 2021-04-04 (×4): qty 1

## 2021-04-04 MED ORDER — NICOTINE 21 MG/24HR TD PT24
21.0000 mg | MEDICATED_PATCH | Freq: Every day | TRANSDERMAL | Status: DC
  Administered 2021-04-04 – 2021-04-09 (×6): 21 mg via TRANSDERMAL
  Filled 2021-04-04 (×9): qty 1

## 2021-04-04 NOTE — ED Notes (Signed)
Patient accidentally pulled the cord in the bathroom. Patient did not need assistance

## 2021-04-04 NOTE — BH Assessment (Signed)
[name] Carol, NP, patient meets inpatient criteria. [name], AC, no beds available. Disposition social worker will follow up in the AM. [name], RN, informed of disposition.

## 2021-04-04 NOTE — ED Notes (Signed)
Pt wanded by security and walked back to room 30. Pt has one belonging bag - placed in locker 30.

## 2021-04-04 NOTE — Progress Notes (Signed)
Pt admitted involuntarily to [facility] adult inpatient unit d/t suicidal ideation.  Pt endorses suicidal ideation with plan to hang self.  Contracts for safety.  Pt reports stressors as increase in depression and anxiety as well as PTSD resulting from "life issues" including being "locked up".  Pt denied current legal issues.  Pt states he is homeless and unemployed.  Pt denied substance abuse during admission interview.  He stated he stopped drinking years ago but did get drunk a month ago and smokes 1 ppd.  Denied use of any other substances.  Pt was not fully engaged in the interview process stating too many questions were being asked.  Pt denied HI and AVH.  Fifteen minute checks initiated for patient safety.  Pt safe on unit.

## 2021-04-04 NOTE — ED Notes (Signed)
Patient refused CBG. 

## 2021-04-04 NOTE — ED Notes (Signed)
Pt belongings placed in cabinet behind triage nurses station.  1 Personal belongings bag

## 2021-04-04 NOTE — ED Notes (Signed)
TTS consult in process. 

## 2021-04-04 NOTE — ED Notes (Signed)
Patient talking to himself in room

## 2021-04-04 NOTE — ED Notes (Signed)
Patient awake ambulating in room calmly
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
## 2021-04-04 NOTE — Tx Team (Addendum)
Initial Treatment Plan 04/04/2021 3:54 PM [name] [name] [id number]    PATIENT STRESSORS: Other: homelessness, PTSD   PATIENT STRENGTHS: Average or above average intelligence Capable of independent living Communication skills General fund of knowledge Motivation for treatment/growth Physical Health   PATIENT IDENTIFIED PROBLEMS: anxiety  depression  Suicidal ideation                 DISCHARGE CRITERIA:  Improved stabilization in mood, thinking, and/or behavior Motivation to continue treatment in a less acute level of care Need for constant or close observation no longer present Reduction of life-threatening or endangering symptoms to within safe limits Verbal commitment to aftercare and medication compliance Withdrawal symptoms are absent or subacute and managed without 24-hour nursing intervention  PRELIMINARY DISCHARGE PLAN: Outpatient therapy  PATIENT/FAMILY INVOLVEMENT: This treatment plan has been presented to and reviewed with the patient, [name] [name], and/or family member.  The patient and family have been given the opportunity to ask questions and make suggestions.  Hoover Browns, RN 04/04/2021, 3:54 PM

## 2021-04-04 NOTE — Progress Notes (Signed)
04/04/2021  1140  Called to give report. Alcario Drought will call back for report.

## 2021-04-04 NOTE — Consult Note (Signed)
  ELIAZ FOUT is a  45 year old male with history of substance use, suicidal thoughts, malingering. He was recently admitted to [facility] for MDD, and susbtance abuse on 03/18/21. He was later discharged and presented to [facility] 3 additional times with suicidal ideations and malingering (had to be escorted off the property by police refusal to leave). He begins to threaten suicide once he is advised of discharged. On lab review he continues to have an increasing elevated WBC (14.1>16.9) today. He was treated at [facility] for an infection, and discharged with azithromycin in which he did not complete. He endorses a history with no compliance for his psychotropic medications citing ineffectiveness and costs. He states his main problem is homelessness and suicidal thoughts. Patient has already been accepted to [facility] at this time, although he likely could benefit from outpatient behavioral health as his problems are chronic. Will require substance abuse resources, housing resources, and labs.

## 2021-04-04 NOTE — Progress Notes (Signed)
04/04/2021  1323  Called police [phone] to transport patient to [facility].

## 2021-04-04 NOTE — ED Notes (Signed)
[name] [name] C/O UNIT NOISE FROM OTHER PT GIVEN EAR PLUGS BUT NOT USING THEM.

## 2021-04-04 NOTE — ED Notes (Addendum)
Pt given urinal and instructed to provide urine sample when he is able. Pt went to restroom and flushed toilet but urinal remained empty when this writer checked back for sample. Pt stated, "I'll pee in the jug when I feel like it, okay?" Pt shut door after I advised him that the door cannot be closed all the way. Pt complaining about another pt on unit making various noises and asking if I can "shut that the hell up". I advised that there was nothing I could do about that pt. Pt given ear plugs but is not using them at this time.

## 2021-04-04 NOTE — ED Notes (Signed)
Patient received breakfast tray 

## 2021-04-04 NOTE — BH Assessment (Addendum)
BHH Assessment Progress Note   Per Cecilio Asper, NP , this pt requires psychiatric hospitalization.  Linsey, RN, [facility] has assigned pt to [facility] Rm 304-2 to the service of Landry Mellow, MD.  Richelle Ito will reach out when [facility] is ready to receive pt.  Pt presents under IVC initiated by EDP Benjiman Core, MD and IVC documents have been faxed to [facility].  EDP Mancel Bale, MD and pt's nurse, Addison Naegeli, have been notified, and Addison Naegeli agrees to call report to [phone].  Pt is to be transported via [employer].   Doylene Canning, [place] Behavioral Health Coordinator [phone]  Addendum:  Per Richelle Ito, [facility] will be ready to receive pt at 13:00. Dr Effie Shy and Addison Naegeli have been notified.  Doylene Canning, [place] Behavioral Health Coordinator [phone]

## 2021-04-04 NOTE — ED Notes (Signed)
Pt laying on floor in triage 4. When this writer asked patient to lay in the triage chair letting him now he would be more comfortable.  Pt stated " I can lay where I want too lady. I'm going to lay on this damn floor until you get me a room."

## 2021-04-04 NOTE — Progress Notes (Signed)
Pt observed pacing the hallway talking and laughing to self but when asked he denied HI/AVH. He endorsed SI "by shooting self." He verbally agreed to notify staff of self harm thoughts. He presents impulsive, disheveled with poor judgement. He ate his HS snacks and was med compliant though he stated that "tell the doctor that I will not take Wellbutrin because it makes me fell some kind of way, I haven't taken it in years." Walked off when asked what he meant by "some kind of way." He received PRN Trazodone for sleep wh good effect. No falls or unsafe behavior noted thus far. Q15 min observations maintained for safety and support provided as needed.   04/04/21 2200  Psych Admission Type (Psych Patients Only)  Admission Status Involuntary  Psychosocial Assessment  Patient Complaints Anxiety;Depression  Eye Contact Brief;Darting  Facial Expression Animated;Anxious  Affect Labile  Speech Pressured  Interaction Avoidant  Motor Activity Restless  Appearance/Hygiene In scrubs;Disheveled;Poor hygiene  Behavior Characteristics Anxious;Impulsive  Mood Depressed;Anxious;Suspicious  Thought Process  Coherency Circumstantial  Content Paranoia  Delusions None reported or observed  Perception Hallucinations  Hallucination Auditory (Talking to unseen others in hallway)  Judgment Poor  Confusion None  Danger to Self  Current suicidal ideation? Verbalizes ("SI by shooting self")  Self-Injurious Behavior Some self-injurious ideation observed or expressed.  No lethal plan expressed   Agreement Not to Harm Self Yes  Description of Agreement verbally contract to notify staff  Danger to Others  Danger to Others None reported or observed

## 2021-04-04 NOTE — Progress Notes (Signed)
04/04/2021  1306  Michael called to get report. Report given to Casimiro Needle at [facility]

## 2021-04-04 NOTE — ED Provider Notes (Signed)
Emergency Medicine Observation Re-evaluation Note  [name] [name] is a 45 y.o. male, seen on rounds today.  Pt initially presented to the ED for complaints of Suicidal and Hallucinations Currently, the patient is resting comfortably.  Physical Exam  BP 117/65 (BP Location: Left Arm)   Pulse 77   Temp 98.3 F (36.8 C) (Oral)   Resp 18   Ht 5\' 9"  (1.753 m)   Wt 77.1 kg   SpO2 98%   BMI 25.10 kg/m  Physical Exam General: Nontoxic appearance Cardiac: Heart rate Lungs: Normal respiratory rate Psych: Calm  ED Course / MDM  EKG:   I have reviewed the labs performed to date as well as medications administered while in observation.  Recent changes in the last 24 hours include cooperative with evaluation.  Plan  Current plan is for psychiatric placement. Patient is under full IVC at this time.  The patient has been placed in psychiatric observation due to the need to provide a safe environment for the patient while obtaining psychiatric consultation and evaluation, as well as ongoing medical and medication management to treat the patient's condition.  The patient has been placed under full IVC at this time.    , MD 04/08/21 1152

## 2021-04-05 DIAGNOSIS — F323 Major depressive disorder, single episode, severe with psychotic features: Secondary | ICD-10-CM

## 2021-04-05 LAB — LIPID PANEL
Cholesterol: 128 mg/dL (ref 0–200)
HDL: 42 mg/dL (ref >40)
LDL Cholesterol: 64 mg/dL (ref 0–99)
Total CHOL/HDL Ratio: 3 RATIO
Triglycerides: 110 mg/dL (ref <150)
VLDL: 22 mg/dL (ref 0–40)

## 2021-04-05 LAB — TSH: TSH: 0.982 u[IU]/mL (ref 0.350–4.500)

## 2021-04-05 MED ORDER — CLONIDINE HCL 0.1 MG PO TABS
0.1000 mg | ORAL_TABLET | ORAL | Status: DC
  Administered 2021-04-07: 0.1 mg via ORAL
  Filled 2021-04-05 (×4): qty 1

## 2021-04-05 MED ORDER — CLONIDINE HCL 0.1 MG PO TABS
0.1000 mg | ORAL_TABLET | Freq: Every day | ORAL | Status: DC
  Filled 2021-04-05: qty 1

## 2021-04-05 MED ORDER — ADULT MULTIVITAMIN W/MINERALS CH
1.0000 | ORAL_TABLET | Freq: Every day | ORAL | Status: DC
  Administered 2021-04-05 – 2021-04-09 (×5): 1 via ORAL
  Filled 2021-04-05 (×9): qty 1

## 2021-04-05 MED ORDER — METHOCARBAMOL 500 MG PO TABS
500.0000 mg | ORAL_TABLET | Freq: Three times a day (TID) | ORAL | Status: DC | PRN
  Administered 2021-04-06: 500 mg via ORAL
  Filled 2021-04-05: qty 1

## 2021-04-05 MED ORDER — ONDANSETRON 4 MG PO TBDP: 4.0000 mg | ORAL_TABLET | Freq: Four times a day (QID) | ORAL | Status: DC | PRN

## 2021-04-05 MED ORDER — LOPERAMIDE HCL 2 MG PO CAPS: 2.0000 mg | ORAL_CAPSULE | ORAL | Status: DC | PRN

## 2021-04-05 MED ORDER — HYDROXYZINE HCL 25 MG PO TABS
25.0000 mg | ORAL_TABLET | Freq: Four times a day (QID) | ORAL | Status: DC | PRN
  Administered 2021-04-06 – 2021-04-09 (×7): 25 mg via ORAL
  Filled 2021-04-05 (×7): qty 1

## 2021-04-05 MED ORDER — AZITHROMYCIN 500 MG PO TABS
500.0000 mg | ORAL_TABLET | Freq: Every day | ORAL | Status: AC
  Administered 2021-04-05: 500 mg via ORAL
  Filled 2021-04-05: qty 1
  Filled 2021-04-05: qty 2

## 2021-04-05 MED ORDER — DICYCLOMINE HCL 20 MG PO TABS: 20.0000 mg | ORAL_TABLET | Freq: Four times a day (QID) | ORAL | Status: DC | PRN

## 2021-04-05 MED ORDER — CLONIDINE HCL 0.1 MG PO TABS
0.1000 mg | ORAL_TABLET | Freq: Four times a day (QID) | ORAL | Status: AC
  Administered 2021-04-05 – 2021-04-06 (×7): 0.1 mg via ORAL
  Filled 2021-04-05 (×10): qty 1

## 2021-04-05 MED ORDER — ENSURE ENLIVE PO LIQD
237.0000 mL | ORAL | Status: DC
  Administered 2021-04-05 – 2021-04-06 (×2): 237 mL via ORAL
  Filled 2021-04-05 (×6): qty 237

## 2021-04-05 MED ORDER — AZITHROMYCIN 250 MG PO TABS
250.0000 mg | ORAL_TABLET | Freq: Every day | ORAL | Status: AC
  Administered 2021-04-06 – 2021-04-09 (×4): 250 mg via ORAL
  Filled 2021-04-05 (×5): qty 1

## 2021-04-05 MED ORDER — NAPROXEN 500 MG PO TABS
500.0000 mg | ORAL_TABLET | Freq: Two times a day (BID) | ORAL | Status: DC | PRN
  Administered 2021-04-06 – 2021-04-08 (×2): 500 mg via ORAL
  Filled 2021-04-05 (×2): qty 1

## 2021-04-05 NOTE — Progress Notes (Signed)
NUTRITION ASSESSMENT RD working remotely.   Pt identified as at risk on the Malnutrition Screen Tool  INTERVENTION: - will order Ensure Enlive once/day, each supplement provides 350 kcal and 20 grams of protein. - will order 1 tablet multivitamin with minerals/day. [facility] staff to continue to encourage PO intakes of meals, snacks, and supplements.    NUTRITION DIAGNOSIS: Unintentional weight loss related to sub-optimal intake as evidenced by pt report.   Goal: Pt to meet >/= 90% of their estimated nutrition needs.  Monitor:  PO intake  Assessment:  Patient with psychiatric hx of bipolar disorder and SI. He has hx of drug/polysubstance abuse.   He presented to the ED due to SI, HI, and AVH. He reported to ED staff that he has weapons stashed in the woods that he plans to use to kill himself. He reported that he had not been taking his psych meds.   Weight on 6/13 was 170 lb. PTA the most recently documented weight was 184 lb at [facility] on 03/18/21. This indicates 14 lb weight loss (7.6% body weight) in 2 weeks which is significant for time frame.     45 y.o. male  Height: Ht Readings from Last 1 Encounters:  04/03/21 5\' 9"  (1.753 m)    Weight: Wt Readings from Last 1 Encounters:  04/03/21 77.1 kg    Weight Hx: Wt Readings from Last 10 Encounters:  04/03/21 77.1 kg  01/24/18 95.3 kg  01/24/18 95.3 kg  12/26/17 95.3 kg  12/25/17 90.7 kg  12/25/17 77.6 kg  07/01/17 104.8 kg  06/16/17 85.7 kg  06/15/17 92.1 kg  11/28/16 89.8 kg    BMI:  There is no height or weight on file to calculate BMI.  Estimated Nutritional Needs: Kcal: 25-30 kcal/kg Protein: > 1 gram protein/kg Fluid: 1 ml/kcal  Diet Order:  Diet Order             Diet regular Room service appropriate? Yes; Fluid consistency: Thin  Diet effective now                  Pt is also offered choice of unit snacks mid-morning and mid-afternoon.  Pt is eating as desired.   Lab results and  medications reviewed.       01/26/17, MS, RD, LDN, CNSC Inpatient Clinical Dietitian RD pager # available in [facility]  After hours/weekend pager # available in [facility]

## 2021-04-05 NOTE — Tx Team (Signed)
Interdisciplinary Treatment and Diagnostic Plan Update  04/05/2021 Time of Session: 9:20am  [name] [name] MRN: [id number]  Principal Diagnosis: <principal problem not specified>  Secondary Diagnoses: Active Problems:   MDD (major depressive disorder), single episode, severe with psychotic features ([place])   Current Medications:  Current Facility-Administered Medications  Medication Dose Route Frequency Provider Last Rate Last Admin   acetaminophen (TYLENOL) tablet 650 mg  650 mg Oral Q6H PRN Chalmers Guest, NP   650 mg at 04/05/21 0928   alum & mag hydroxide-simeth (MAALOX/MYLANTA) 200-200-20 MG/5ML suspension 30 mL  30 mL Oral Q4H PRN Chalmers Guest, NP       feeding supplement (ENSURE ENLIVE / ENSURE PLUS) liquid 237 mL  237 mL Oral Q24H Sharma Covert, MD       furosemide (LASIX) tablet 20 mg  20 mg Oral BID Sharma Covert, MD   20 mg at 04/05/21 3664   gabapentin (NEURONTIN) capsule 800 mg  800 mg Oral TID Sharma Covert, MD   800 mg at 04/05/21 4034   magnesium hydroxide (MILK OF MAGNESIA) suspension 30 mL  30 mL Oral Daily PRN Chalmers Guest, NP       multivitamin with minerals tablet 1 tablet  1 tablet Oral Daily Clary, Cordie Grice, MD       nicotine (NICODERM CQ - dosed in mg/24 hours) patch 21 mg  21 mg Transdermal Daily Sharma Covert, MD   21 mg at 04/05/21 0926   prazosin (MINIPRESS) capsule 1 mg  1 mg Oral QHS Sharma Covert, MD   1 mg at 04/04/21 2107   QUEtiapine (SEROQUEL) tablet 50 mg  50 mg Oral QHS Sharma Covert, MD   50 mg at 04/04/21 2108   sertraline (ZOLOFT) tablet 100 mg  100 mg Oral Daily Sharma Covert, MD   100 mg at 04/05/21 0926   traZODone (DESYREL) tablet 50 mg  50 mg Oral QHS PRN Chalmers Guest, NP   50 mg at 04/04/21 2107   PTA Medications: No medications prior to admission.    Patient Stressors: Other: homelessness, PTSD  Patient Strengths: Average or above average intelligence Capable of independent living Communication  skills General fund of knowledge Motivation for treatment/growth Physical Health  Treatment Modalities: Medication Management, Group therapy, Case management,  1 to 1 session with clinician, Psychoeducation, Recreational therapy.   Physician Treatment Plan for Primary Diagnosis: <principal problem not specified> Long Term Goal(s):     Short Term Goals:    Medication Management: Evaluate patient's response, side effects, and tolerance of medication regimen.  Therapeutic Interventions: 1 to 1 sessions, Unit Group sessions and Medication administration.  Evaluation of Outcomes: Not Met  Physician Treatment Plan for Secondary Diagnosis: Active Problems:   MDD (major depressive disorder), single episode, severe with psychotic features ([place])  Long Term Goal(s):     Short Term Goals:       Medication Management: Evaluate patient's response, side effects, and tolerance of medication regimen.  Therapeutic Interventions: 1 to 1 sessions, Unit Group sessions and Medication administration.  Evaluation of Outcomes: Not Met   RN Treatment Plan for Primary Diagnosis: <principal problem not specified> Long Term Goal(s): Knowledge of disease and therapeutic regimen to maintain health will improve  Short Term Goals: Ability to remain free from injury will improve, Ability to demonstrate self-control, Ability to participate in decision making will improve, Ability to verbalize feelings will improve, Ability to disclose and discuss suicidal ideas, and Ability  to identify and develop effective coping behaviors will improve  Medication Management: RN will administer medications as ordered by provider, will assess and evaluate patient's response and provide education to patient for prescribed medication. RN will report any adverse and/or side effects to prescribing provider.  Therapeutic Interventions: 1 on 1 counseling sessions, Psychoeducation, Medication administration, Evaluate responses to  treatment, Monitor vital signs and CBGs as ordered, Perform/monitor CIWA, COWS, AIMS and Fall Risk screenings as ordered, Perform wound care treatments as ordered.  Evaluation of Outcomes: Not Met   LCSW Treatment Plan for Primary Diagnosis: <principal problem not specified> Long Term Goal(s): Safe transition to appropriate next level of care at discharge, Engage patient in therapeutic group addressing interpersonal concerns.  Short Term Goals: Engage patient in aftercare planning with referrals and resources, Increase social support, Increase emotional regulation, Facilitate acceptance of mental health diagnosis and concerns, Facilitate patient progression through stages of change regarding substance use diagnoses and concerns, Identify triggers associated with mental health/substance abuse issues, and Increase skills for wellness and recovery  Therapeutic Interventions: Assess for all discharge needs, 1 to 1 time with Social worker, Explore available resources and support systems, Assess for adequacy in community support network, Educate family and significant other(s) on suicide prevention, Complete Psychosocial Assessment, Interpersonal group therapy.  Evaluation of Outcomes: Not Met   Progress in Treatment: Attending groups: Yes. Participating in groups: Yes. Taking medication as prescribed: Yes. Toleration medication: Yes. Family/Significant other contact made: Yes, individual(s) contacted:  If consents are provided  Patient understands diagnosis: No. Discussing patient identified problems/goals with staff: Yes. Medical problems stabilized or resolved: Yes. Denies suicidal/homicidal ideation: Yes. Issues/concerns per patient self-inventory: No.   New problem(s) identified: No, Describe:  None   New Short Term/Long Term Goal(s): medication stabilization, elimination of SI thoughts, development of comprehensive mental wellness plan.   Patient Goals: Did not attend   Discharge  Plan or Barriers: Patient recently admitted. CSW will continue to follow and assess for appropriate referrals and possible discharge planning.   Reason for Continuation of Hospitalization: Delusions  Hallucinations Medication stabilization Suicidal ideation  Estimated Length of Stay: 3 to 5 days   Attendees: Patient: Did not attend  04/05/2021   Physician: Myles Lipps, MD 04/05/2021   Nursing:  04/05/2021   RN Care Manager: 04/05/2021   Social Worker: Verdis Frederickson, [place] 04/05/2021   Recreational Therapist:  04/05/2021   Other:  04/05/2021   Other:  04/05/2021   Other: 04/05/2021     Scribe for Treatment Team: Darleen Crocker, [place] 04/05/2021 11:36 AM

## 2021-04-05 NOTE — Progress Notes (Addendum)
D- Patient alert and oriented. Patient affect/mood reported as stable. Denies SI, HI, AVH, and minor c/o headache pain. Patient c/o constipation, Maalox given as ordered. C.I.W.A rated as 2 at this time.   A- Scheduled medications administered to patient, per MD orders. Support and encouragement provided.  Routine safety checks conducted every 15 minutes.  Patient informed to notify staff with problems or concerns.  R- No adverse drug reactions noted. Patient contracts for safety at this time. Patient compliant with medications and treatment plan. Patient receptive, calm, and cooperative. Patient interacts well with others on the unit.  Patient remains safe at this time.              NOVEL CORONAVIRUS (COVID-19) DAILY CHECK-OFF SYMPTOMS - answer yes or no to each - every day NO YES  Have you had a fever in the past 24 hours?  Fever (Temp > 37.80C / 100F) X    Have you had any of these symptoms in the past 24 hours? New Cough  Sore Throat   Shortness of Breath  Difficulty Breathing  Unexplained Body Aches   X    Have you had any one of these symptoms in the past 24 hours not related to allergies?   Runny Nose  Nasal Congestion  Sneezing   X    If you have had runny nose, nasal congestion, sneezing in the past 24 hours, has it worsened?   X    EXPOSURES - check yes or no X    Have you traveled outside the state in the past 14 days?   X    Have you been in contact with someone with a confirmed diagnosis of COVID-19 or PUI in the past 14 days without wearing appropriate PPE?   X    Have you been living in the same home as a person with confirmed diagnosis of COVID-19 or a PUI (household contact)?     X    Have you been diagnosed with COVID-19?     X                                                                                                                             What to do next: Answered NO to all: Answered YES to anything:    Proceed with unit schedule  Follow the [facility] Inpatient Flowsheet.

## 2021-04-05 NOTE — Plan of Care (Signed)
  Problem: Coping: Goal: Ability to identify and develop effective coping behavior will improve Outcome: Progressing   Problem: Coping: Goal: Ability to identify and develop effective coping behavior will improve Outcome: Progressing   Problem: Self-Concept: Goal: Ability to identify factors that promote anxiety will improve Outcome: Progressing Goal: Level of anxiety will decrease Outcome: Progressing   Problem: Self-Concept: Goal: Ability to identify factors that promote anxiety will improve Outcome: Progressing

## 2021-04-05 NOTE — Progress Notes (Signed)
Recreation Therapy Notes  Date: 6.15.22 Time: 0930 Location: [address] Dayroom  Group Topic: Stress Management   Goal Area(s) Addresses:  Patient will actively participate in stress management techniques presented during session.  Patient will successfully identify benefit of practicing stress management post d/c.   Behavioral Response: Appropriate  Intervention: Guided exercise with ambient sound and script  Activity :Guided Imagery  LRT provided education, instruction, and demonstration on practice of visualization via guided imagery. Patient was asked to participate in the technique introduced during session. LRT also debriefed including topics of mindfulness, stress management and specific scenarios each patient could use these techniques. Patients were given suggestions of ways to access scripts post d/c and encouraged to explore Youtube and other apps available on smartphones, tablets, and computers.   Education:  Stress Management, Discharge Planning.   Education Outcome: Acknowledges education  Clinical Observations/Feedback: Patient actively engaged in technique introduced, expressed no concerns.    Caroll Rancher, LRT/CTRS      Caroll Rancher A 04/05/2021 11:27 AM

## 2021-04-05 NOTE — H&P (Signed)
Psychiatric Admission Assessment Adult  Patient Identification: [name] [name] MRN:  [id number] Date of Evaluation:  04/05/2021 Chief Complaint:  MDD (major depressive disorder), single episode, severe with psychotic features (HCC) [F32.3] Principal Diagnosis: <principal problem not specified> Diagnosis:  Active Problems:   MDD (major depressive disorder), single episode, severe with psychotic features (HCC)  History of Present Illness: Patient is seen and examined.  Patient is a 45 year old male with an unspecified past psychiatric history who presented on 04/03/2021 to the [facility] emergency department complaining of suicidal ideation as well as hallucinations.  He was noted to be continuously speaking to himself.  He stated he had a plan to hang himself.  The patient stated at that time that he had recurring thoughts of killing himself.  And he stated that he "have weapons in the woods, a gun and 2 bullets".  No other details were given.  He reported having had "a bunch" of suicide attempts with the most recent being a year ago with "[place] with my gun".  He reported auditory hallucinations but no specific details were given.  He also reported increased paranoia of "demons and God following me".  He was seen by the consult service, and review of the electronic medical record revealed that he had been recently admitted to the [facility] psychiatric unit on 03/18/2021.  His diagnosis at that time was major depression and substance abuse.  He was discharged, and then presented to the [facility] on 3 separate occasions diagnosed with suicidal ideation as well as malingering.  He had to be escorted off the property by police because of his refusal to leave.  He had been treated with azithromycin at [facility] for an unspecified upper respiratory tract infection.  He reported to the consult service that his main problem was homelessness and  suicidal ideation.  Despite this information he had already been accepted to the [facility] behavioral health hospital, and it was felt that he could benefit from outpatient treatment.  It was unclear despite this information that he be admitted.  The patient stated today the reason why he wanted to be in the hospital was he needed his blood pressure checked, and he also was having palpitations.  He was admitted to the hospital for evaluation and stabilization.  Review of the electronic medical record revealed that the patient had been picked up several times standing on bridges, and reporting to police that he was suicidal.  His discharge medications from [facility] included citalopram, hydroxyzine, mirtazapine, naloxone, azithromycin and Suboxone.  [facility].  Review of the PMP database revealed his last prescription for Suboxone was on 03/23/2021.  He was admitted to the hospital for evaluation and stabilization.  He received 8 strips of 8/2 mg films.  It also revealed that he had been prescribed Adderall for several months in 2021.  Associated Signs/Symptoms: Depression Symptoms:  depressed mood, anhedonia, insomnia, psychomotor agitation, fatigue, feelings of worthlessness/guilt, difficulty concentrating, hopelessness, impaired memory, suicidal thoughts with specific plan, anxiety, loss of energy/fatigue, disturbed sleep, Duration of Depression Symptoms: Less than two weeks  (Hypo) Manic Symptoms:  Impulsivity, Irritable Mood, Labiality of Mood, Anxiety Symptoms:  Excessive Worry, Psychotic Symptoms:   Denied PTSD Symptoms: Negative Total Time spent with patient: 30 minutes  Past Psychiatric History: Patient has been in the emergency room on multiple occasions because of suicidal ideation.  He had a recent hospitalization at  [facility], was discharged and then about the same day presented to the [facility].  He presented there either 2 or 3 times to their emergency room.  He then presented here.  He has been previously treated with Adderall as well as Suboxone.  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  Is the patient at risk to self? No.  Has the patient been a risk to self in the past 6 months? Yes.    Has the patient been a risk to self within the distant past? No.  Is the patient a risk to others? No.  Has the patient been a risk to others in the past 6 months? No.  Has the patient been a risk to others within the distant past? No.   Prior Inpatient Therapy:   Prior Outpatient Therapy:    Alcohol Screening: 1. How often do you have a drink containing alcohol?: Monthly or less 2. How many drinks containing alcohol do you have on a typical day when you are drinking?: 1 or 2 3. How often do you have six or more drinks on one occasion?: Never AUDIT-C Score: 1 4. How often during the last year have you found that you were not able to stop drinking once you had started?: Never 5. How often during the last year have you failed to do what was normally expected from you because of drinking?: Never 6. How often during the last year have you needed a first drink in the morning to get yourself going after a heavy drinking session?: Never 7. How often during the last year have you had a feeling of guilt of remorse after drinking?: Never 8. How often during the last year have you been unable to remember what happened the night before because you had been drinking?: Never 9. Have you or someone else been injured as a result of your drinking?: No 10. Has a relative or friend or a doctor or another health worker been concerned about your drinking or suggested you cut down?: Yes, but not in the last year Alcohol Use Disorder Identification Test Final Score (AUDIT): 3 Substance Abuse History in the last 12 months:  Yes.    Consequences of Substance Abuse: Negative Previous Psychotropic Medications: Yes  Psychological Evaluations: Yes  Past Medical History:  Past Medical History:  Diagnosis Date   Bipolar disorder (HCC)    Cellulitis    Depression    Drug abuse (HCC)    Hepatitis C     Past Surgical History:  Procedure Laterality Date   BACK SURGERY     ROTATOR CUFF REPAIR     SHOULDER SURGERY     Family History:  Family History  Problem Relation Age of Onset   Alcohol abuse Father    Cancer Maternal Aunt    Family Psychiatric  History: Noncontributory Tobacco Screening:   Social History:  Social History   Substance and Sexual Activity  Alcohol Use Not Currently   Alcohol/week: 12.0 standard drinks   Types: 12 Cans of beer per week   Comment: heavy     Social History   Substance and Sexual Activity  Drug Use Not Currently   Types: Marijuana, Benzodiazepines, Other-see comments, Methamphetamines, Cocaine    Additional Social History: Marital status: Single Additional relationship information: Recently separated. Are you sexually active?: No What is your sexual orientation?: "Straight" Does patient have children?: Yes How many children?: 2 How is  patient's relationship with their children?: "I havent' been in her life cause of drugs, I'd go into recovery programs and I'd try to get re-established with them over and over it hurt them. I love them but they're not healed from this. I kinda talk to one of them"                         Allergies:   Allergies  Allergen Reactions   Lithium Anaphylaxis   Penicillins Rash    Has patient had a PCN reaction causing immediate rash, facial/tongue/throat swelling, SOB or lightheadedness with hypotension: Yes Has patient had a PCN reaction causing severe rash involving mucus membranes or skin necrosis: No Has patient had a PCN reaction that required hospitalization: No Has patient had a PCN reaction occurring within the last 10  years: No If all of the above answers are "NO", then may proceed with Cephalosporin use.    Lab Results:  Results for orders placed or performed during the hospital encounter of 04/03/21 (from the past 48 hour(s))  Comprehensive metabolic panel     Status: Abnormal   Collection Time: 04/03/21 10:59 PM  Result Value Ref Range   Sodium 136 135 - 145 mmol/L   Potassium 3.7 3.5 - 5.1 mmol/L   Chloride 102 98 - 111 mmol/L   CO2 25 22 - 32 mmol/L   Glucose, Bld 106 (H) 70 - 99 mg/dL    Comment: Glucose reference range applies only to samples taken after fasting for at least 8 hours.   BUN 18 6 - 20 mg/dL   Creatinine, Ser 1.61 0.61 - 1.24 mg/dL   Calcium 9.6 8.9 - 09.6 mg/dL   Total Protein 8.1 6.5 - 8.1 g/dL   Albumin 4.7 3.5 - 5.0 g/dL   AST 32 15 - 41 U/L   ALT 36 0 - 44 U/L   Alkaline Phosphatase 56 38 - 126 U/L   Total Bilirubin 1.1 0.3 - 1.2 mg/dL   GFR, Estimated >04 >54 mL/min    Comment: (NOTE) Calculated using the CKD-EPI Creatinine Equation (2021)    Anion gap 9 5 - 15    Comment: Performed at [facility], [address] [place], [place] [address]  Ethanol     Status: None   Collection Time: 04/03/21 10:59 PM  Result Value Ref Range   Alcohol, Ethyl (B) <10 <10 mg/dL    Comment: (NOTE) Lowest detectable limit for serum alcohol is 10 mg/dL.  For medical purposes only. Performed at [facility], [address] [place], [place] [address]   CBC with Diff     Status: Abnormal   Collection Time: 04/03/21 10:59 PM  Result Value Ref Range   WBC 16.9 (H) 4.0 - 10.5 K/uL   RBC 4.77 4.22 - 5.81 MIL/uL   Hemoglobin 14.8 13.0 - 17.0 g/dL   HCT 29.5 62.1 - 30.8 %   MCV 91.0 80.0 - 100.0 fL   MCH 31.0 26.0 - 34.0 pg   MCHC 34.1 30.0 - 36.0 g/dL   RDW 65.7 84.6 - 96.2 %   Platelets 338 150 - 400 K/uL   nRBC 0.0 0.0 - 0.2 %   Neutrophils Relative % 55 %   Neutro Abs 9.0 (H) 1.7 - 7.7 K/uL   Lymphocytes Relative 31 %   Lymphs Abs  5.3 (H) 0.7 - 4.0 K/uL   Monocytes Relative 11 %   Monocytes Absolute 1.9 (H) 0.1 -  1.0 K/uL   Eosinophils Relative 3 %   Eosinophils Absolute 0.6 (H) 0.0 - 0.5 K/uL   Basophils Relative 0 %   Basophils Absolute 0.1 0.0 - 0.1 K/uL   Immature Granulocytes 0 %   Abs Immature Granulocytes 0.05 0.00 - 0.07 K/uL    Comment: Performed at [facility], [address] [place], [place] [address]  Acetaminophen level     Status: Abnormal   Collection Time: 04/03/21 10:59 PM  Result Value Ref Range   Acetaminophen (Tylenol), Serum <10 (L) 10 - 30 ug/mL    Comment: (NOTE) Therapeutic concentrations vary significantly. A range of 10-30 ug/mL  may be an effective concentration for many patients. However, some  are best treated at concentrations outside of this range. Acetaminophen concentrations >150 ug/mL at 4 hours after ingestion  and >50 ug/mL at 12 hours after ingestion are often associated with  toxic reactions.  Performed at [facility], [address] [place], [place] [address]   Salicylate level     Status: Abnormal   Collection Time: 04/03/21 10:59 PM  Result Value Ref Range   Salicylate Lvl <7.0 (L) 7.0 - 30.0 mg/dL    Comment: Performed at [facility], [address] [place], [place] [address]  Resp Panel by RT-PCR (Flu A&B, Covid) Nasopharyngeal Swab     Status: None   Collection Time: 04/04/21  3:00 AM   Specimen: Nasopharyngeal Swab; Nasopharyngeal(NP) swabs in vial transport medium  Result Value Ref Range   SARS Coronavirus 2 by RT PCR NEGATIVE NEGATIVE    Comment: (NOTE) SARS-CoV-2 target nucleic acids are NOT DETECTED.  The SARS-CoV-2 RNA is generally detectable in upper respiratory specimens during the acute phase of infection. The lowest concentration of SARS-CoV-2 viral copies this assay can detect is 138 copies/mL. A negative result does not preclude SARS-Cov-2 infection and should not be used as the sole  basis for treatment or other patient management decisions. A negative result may occur with  improper specimen collection/handling, submission of specimen other than nasopharyngeal swab, presence of viral mutation(s) within the areas targeted by this assay, and inadequate number of viral copies(<138 copies/mL). A negative result must be combined with clinical observations, patient history, and epidemiological information. The expected result is Negative.  Fact Sheet for Patients:  BloggerCourse.comhttps://www.fda.gov/media/152166/download  Fact Sheet for Healthcare Providers:  SeriousBroker.ithttps://www.fda.gov/media/152162/download  This test is no t yet approved or cleared by the [place] FDA and  has been authorized for detection and/or diagnosis of SARS-CoV-2 by FDA under an Emergency Use Authorization (EUA). This EUA will remain  in effect (meaning this test can be used) for the duration of the COVID-19 declaration under Section 564(b)(1) of the Act, 21 U.S.C.section 360bbb-3(b)(1), unless the authorization is terminated  or revoked sooner.       Influenza A by PCR NEGATIVE NEGATIVE   Influenza B by PCR NEGATIVE NEGATIVE    Comment: (NOTE) The Xpert Xpress SARS-CoV-2/FLU/RSV plus assay is intended as an aid in the diagnosis of influenza from Nasopharyngeal swab specimens and should not be used as a sole basis for treatment. Nasal washings and aspirates are unacceptable for Xpert Xpress SARS-CoV-2/FLU/RSV testing.  Fact Sheet for Patients: BloggerCourse.comhttps://www.fda.gov/media/152166/download  Fact Sheet for Healthcare Providers: SeriousBroker.ithttps://www.fda.gov/media/152162/download  This test is not yet approved or cleared by the [place] FDA and has been authorized for detection and/or diagnosis of SARS-CoV-2 by FDA under an Emergency Use Authorization (EUA). This EUA will remain in effect (meaning this  test can be used) for the duration of the COVID-19 declaration under Section 564(b)(1) of the Act, 21  U.S.C. section 360bbb-3(b)(1), unless the authorization is terminated or revoked.  Performed at [facility], [address] [place], [place] [address]   Urine rapid drug screen (hosp performed)     Status: Abnormal   Collection Time: 04/04/21  7:19 AM  Result Value Ref Range   Opiates NONE DETECTED NONE DETECTED   Cocaine NONE DETECTED NONE DETECTED   Benzodiazepines NONE DETECTED NONE DETECTED   Amphetamines NONE DETECTED NONE DETECTED   Tetrahydrocannabinol POSITIVE (A) NONE DETECTED   Barbiturates NONE DETECTED NONE DETECTED    Comment: (NOTE) DRUG SCREEN FOR MEDICAL PURPOSES ONLY.  IF CONFIRMATION IS NEEDED FOR ANY PURPOSE, NOTIFY LAB WITHIN 5 DAYS.  LOWEST DETECTABLE LIMITS FOR URINE DRUG SCREEN Drug Class                     Cutoff (ng/mL) Amphetamine and metabolites    1000 Barbiturate and metabolites    200 Benzodiazepine                 200 Tricyclics and metabolites     300 Opiates and metabolites        300 Cocaine and metabolites        300 THC                            50 Performed at [facility], [address] [place], [place] [address]   CBG monitoring, ED     Status: Abnormal   Collection Time: 04/04/21  7:31 AM  Result Value Ref Range   Glucose-Capillary 108 (H) 70 - 99 mg/dL    Comment: Glucose reference range applies only to samples taken after fasting for at least 8 hours.    Blood Alcohol level:  Lab Results  Component Value Date   ETH <10 04/03/2021   ETH <10 01/24/2018    Metabolic Disorder Labs:  Lab Results  Component Value Date   HGBA1C 5.7 (H) 06/18/2017   MPG 116.89 06/18/2017   MPG 120 12/03/2016   No results found for: PROLACTIN Lab Results  Component Value Date   CHOL 165 06/18/2017   TRIG 241 (H) 06/18/2017   HDL 41 06/18/2017   CHOLHDL 4.0 06/18/2017   VLDL 48 (H) 06/18/2017   LDLCALC 76 06/18/2017    Current Medications: Current Facility-Administered Medications   Medication Dose Route Frequency Provider Last Rate Last Admin   acetaminophen (TYLENOL) tablet 650 mg  650 mg Oral Q6H PRN Novella Olive, NP   650 mg at 04/05/21 0928   alum & mag hydroxide-simeth (MAALOX/MYLANTA) 200-200-20 MG/5ML suspension 30 mL  30 mL Oral Q4H PRN Novella Olive, NP   30 mL at 04/05/21 1206   [START ON 04/06/2021] azithromycin (ZITHROMAX) tablet 250 mg  250 mg Oral Daily Antonieta Pert, MD       cloNIDine (CATAPRES) tablet 0.1 mg  0.1 mg Oral QID Antonieta Pert, MD   0.1 mg at 04/05/21 1308   Followed by   Melene Muller ON 04/07/2021] cloNIDine (CATAPRES) tablet 0.1 mg  0.1 mg Oral BH-qamhs Antonieta Pert, MD       Followed by   Melene Muller ON 04/09/2021] cloNIDine (CATAPRES) tablet 0.1 mg  0.1 mg Oral QAC breakfast Antonieta Pert, MD       dicyclomine (BENTYL) tablet 20  mg  20 mg Oral Q6H PRN Antonieta Pert, MD       feeding supplement (ENSURE ENLIVE / ENSURE PLUS) liquid 237 mL  237 mL Oral Q24H Antonieta Pert, MD       furosemide (LASIX) tablet 20 mg  20 mg Oral BID Antonieta Pert, MD   20 mg at 04/05/21 1610   gabapentin (NEURONTIN) capsule 800 mg  800 mg Oral TID Antonieta Pert, MD   800 mg at 04/05/21 1208   hydrOXYzine (ATARAX/VISTARIL) tablet 25 mg  25 mg Oral Q6H PRN Antonieta Pert, MD       loperamide (IMODIUM) capsule 2-4 mg  2-4 mg Oral PRN Antonieta Pert, MD       magnesium hydroxide (MILK OF MAGNESIA) suspension 30 mL  30 mL Oral Daily PRN Novella Olive, NP       methocarbamol (ROBAXIN) tablet 500 mg  500 mg Oral Q8H PRN Antonieta Pert, MD       multivitamin with minerals tablet 1 tablet  1 tablet Oral Daily Antonieta Pert, MD   1 tablet at 04/05/21 1208   naproxen (NAPROSYN) tablet 500 mg  500 mg Oral BID PRN Antonieta Pert, MD       nicotine (NICODERM CQ - dosed in mg/24 hours) patch 21 mg  21 mg Transdermal Daily Antonieta Pert, MD   21 mg at 04/05/21 0926   ondansetron (ZOFRAN-ODT) disintegrating tablet 4 mg  4 mg  Oral Q6H PRN Antonieta Pert, MD       prazosin (MINIPRESS) capsule 1 mg  1 mg Oral QHS Antonieta Pert, MD   1 mg at 04/04/21 2107   QUEtiapine (SEROQUEL) tablet 50 mg  50 mg Oral QHS Antonieta Pert, MD   50 mg at 04/04/21 2108   sertraline (ZOLOFT) tablet 100 mg  100 mg Oral Daily Antonieta Pert, MD   100 mg at 04/05/21 0926   traZODone (DESYREL) tablet 50 mg  50 mg Oral QHS PRN Novella Olive, NP   50 mg at 04/04/21 2107   PTA Medications: No medications prior to admission.    Musculoskeletal: Strength & Muscle Tone: within normal limits Gait & Station: normal Patient leans: N/A            Psychiatric Specialty Exam:  Presentation  General Appearance: Disheveled  Eye Contact:Fair  Speech:Normal Rate  Speech Volume:Increased  Handedness:Right   Mood and Affect  Mood:Anxious; Dysphoric  Affect:Congruent   Thought Process  Thought Processes:Goal Directed  Duration of Psychotic Symptoms: No data recorded Past Diagnosis of Schizophrenia or Psychoactive disorder: No data recorded Descriptions of Associations:Circumstantial  Orientation:Full (Time, Place and Person)  Thought Content:Rumination  Hallucinations:Hallucinations: None  Ideas of Reference:None  Suicidal Thoughts:Suicidal Thoughts: Yes, Passive SI Passive Intent and/or Plan: Without Intent  Homicidal Thoughts:Homicidal Thoughts: No   Sensorium  Memory:Immediate Poor; Recent Poor; Remote Poor  Judgment:Impaired  Insight:Lacking   Executive Functions  Concentration:Fair  Attention Span:Fair  Recall:Poor  Fund of Knowledge:Fair  Language:Fair   Psychomotor Activity  Psychomotor Activity:Psychomotor Activity: Increased   Assets  Assets:Desire for Improvement; Resilience   Sleep  Sleep:Sleep: Good Number of Hours of Sleep: 6.75    Physical Exam: Physical Exam Vitals and nursing note reviewed.  HENT:     Head: Normocephalic and atraumatic.   Pulmonary:     Effort: Pulmonary effort is normal.  Neurological:     General: No focal deficit present.  Mental Status: He is alert and oriented to person, place, and time.   Review of Systems  Cardiovascular:  Positive for chest pain.  All other systems reviewed and are negative. Blood pressure (!) 184/112, pulse (!) 117, temperature 99.3 F (37.4 C), temperature source Oral, resp. rate 18, SpO2 98 %. There is no height or weight on file to calculate BMI.  Treatment Plan Summary: Patient is seen and examined.  Patient is a 45 year old male with the above-stated past psychiatric history who is admitted with suicidal ideation and possible drug withdrawal.  He will be admitted to the hospital.  He will be integrated in the milieu.  He will be encouraged to attend groups.  He was continued on furosemide, Neurontin, prazosin, Seroquel, sertraline and prazosin.  We will restart his azithromycin.  We will contact his outpatient Suboxone clinic to see if he will be continued on this.  If he is we will restart it.  Otherwise I will put him on the opiate detox protocol.  Review of his admission laboratories revealed normal electrolytes including liver function enzymes and creatinine.  His white blood cell count was elevated at 16.9.  Differential showed a mild elevation of his absolute neutrophil count at 9.0.  Acetaminophen was less than 10, salicylate less than 7.  Drug screen was positive for marijuana.  Blood alcohol was less than 10.  EKG showed a normal sinus rhythm with a normal QTc interval, but was a poor baseline.  We will repeat that.  His blood pressure is elevated at 155/100, pulse was 111.  He has low-grade fever at 99.3.  Pulse oximetry on room air was 98%.  Observation Level/Precautions:  Detox 15 minute checks  Laboratory:  CBC Chemistry Profile  Psychotherapy:    Medications:    Consultations:    Discharge Concerns:    Estimated LOS:  Other:     Physician Treatment Plan for  Primary Diagnosis: <principal problem not specified> Long Term Goal(s): Improvement in symptoms so as ready for discharge  Short Term Goals: Ability to identify changes in lifestyle to reduce recurrence of condition will improve, Ability to verbalize feelings will improve, Ability to disclose and discuss suicidal ideas, Ability to demonstrate self-control will improve, Ability to identify and develop effective coping behaviors will improve, Ability to maintain clinical measurements within normal limits will improve, Compliance with prescribed medications will improve, and Ability to identify triggers associated with substance abuse/mental health issues will improve  Physician Treatment Plan for Secondary Diagnosis: Active Problems:   MDD (major depressive disorder), single episode, severe with psychotic features (HCC)  Long Term Goal(s): Improvement in symptoms so as ready for discharge  Short Term Goals: Ability to identify changes in lifestyle to reduce recurrence of condition will improve, Ability to verbalize feelings will improve, Ability to disclose and discuss suicidal ideas, Ability to demonstrate self-control will improve, Ability to identify and develop effective coping behaviors will improve, Ability to maintain clinical measurements within normal limits will improve, Compliance with prescribed medications will improve, and Ability to identify triggers associated with substance abuse/mental health issues will improve  I certify that inpatient services furnished can reasonably be expected to improve the patient's condition.    Antonieta Pert, MD 6/15/20223:50 PM

## 2021-04-05 NOTE — Progress Notes (Signed)
Psychoeducational Group Note  Date:  04/05/2021 Time:  2049  Group Topic/Focus:  Wrap-Up Group:   The focus of this group is to help patients review their daily goal of treatment and discuss progress on daily workbooks.  Participation Level: Did Not Attend  Participation Quality:  Not Applicable  Affect:  Not Applicable  Cognitive:  Not Applicable  Insight:  Not Applicable  Engagement in Group: Not Applicable  Additional Comments:  The patient did not attend group this evening.   Hazle Coca S 04/05/2021, 8:49 PM
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
## 2021-04-05 NOTE — Progress Notes (Signed)
Adult Psychoeducational Group Note  Date:  04/05/2021 Time:  10:15 AM  Group Topic/Focus:  Goals Group:   The focus of this group is to help patients establish daily goals to achieve during treatment and discuss how the patient can incorporate goal setting into their daily lives to aide in recovery.  Participation Level:  Did Not Attend  [facility] 04/05/2021, 10:15 AM

## 2021-04-05 NOTE — BHH Suicide Risk Assessment (Signed)
[facility] Admission Suicide Risk Assessment   Nursing information obtained from:  Patient Demographic factors:  Male, Caucasian, Low socioeconomic status, Living alone, Unemployed, Access to firearms Current Mental Status:  Suicidal ideation indicated by patient Loss Factors:  NA Historical Factors:  Family history of mental illness or substance abuse Risk Reduction Factors:  NA  Total Time spent with patient: 30 minutes Principal Problem: <principal problem not specified> Diagnosis:  Active Problems:   MDD (major depressive disorder), single episode, severe with psychotic features (HCC)  Subjective Data: Patient is seen and examined.  Patient is a 45 year old male with an unspecified past psychiatric history who presented on 04/03/2021 to the [facility] emergency department complaining of suicidal ideation as well as hallucinations.  He was noted to be continuously speaking to himself.  He stated he had a plan to hang himself.  The patient stated at that time that he had recurring thoughts of killing himself.  And he stated that he "have weapons in the woods, a gun and 2 bullets".  No other details were given.  He reported having had "a bunch" of suicide attempts with the most recent being a year ago with "[place] with my gun".  He reported auditory hallucinations but no specific details were given.  He also reported increased paranoia of "demons and God following me".  He was seen by the consult service, and review of the electronic medical record revealed that he had been recently admitted to the [facility] psychiatric unit on 03/18/2021.  His diagnosis at that time was major depression and substance abuse.  He was discharged, and then presented to the [facility] on 3 separate occasions diagnosed with suicidal ideation as well as malingering.  He had to be escorted off the property by police because of his refusal to leave.  He had been  treated with azithromycin at [facility] for an unspecified upper respiratory tract infection.  He reported to the consult service that his main problem was homelessness and suicidal ideation.  Despite this information he had already been accepted to the [facility] behavioral health hospital, and it was felt that he could benefit from outpatient treatment.  It was unclear despite this information that he be admitted.  The patient stated today the reason why he wanted to be in the hospital was he needed his blood pressure checked, and he also was having palpitations.  He was admitted to the hospital for evaluation and stabilization.  Review of the electronic medical record revealed that the patient had been picked up several times standing on bridges, and reporting to police that he was suicidal.  His discharge medications from [facility] included citalopram, hydroxyzine, mirtazapine, naloxone, azithromycin and Suboxone.  [facility].  Review of the PMP database revealed his last prescription for Suboxone was on 03/23/2021.  He was admitted to the hospital for evaluation and stabilization.  He received 8 strips of 8/2 mg films.  It also revealed that he had been prescribed Adderall for several months in 2021.  Continued Clinical Symptoms:  Alcohol Use Disorder Identification Test Final Score (AUDIT): 3 The "Alcohol Use Disorders Identification Test", Guidelines for Use in Primary Care, Second Edition.  World Science writer [facility]). Score between 0-7:  no or low risk or alcohol related problems. Score between 8-15:  moderate risk of alcohol related problems. Score between 16-19:  high risk of alcohol related problems. Score 20  or above:  warrants further diagnostic evaluation for alcohol dependence and treatment.   CLINICAL FACTORS:   Depression:   Anhedonia Comorbid alcohol abuse/dependence Hopelessness Impulsivity Insomnia Alcohol/Substance  Abuse/Dependencies More than one psychiatric diagnosis Unstable or Poor Therapeutic Relationship   Musculoskeletal: Strength & Muscle Tone: within normal limits Gait & Station: normal Patient leans: N/A  Psychiatric Specialty Exam:  Presentation  General Appearance: Disheveled  Eye Contact:Fair  Speech:Normal Rate  Speech Volume:Increased  Handedness:Right   Mood and Affect  Mood:Anxious; Dysphoric  Affect:Congruent   Thought Process  Thought Processes:Goal Directed  Descriptions of Associations:Circumstantial  Orientation:Full (Time, Place and Person)  Thought Content:Rumination  History of Schizophrenia/Schizoaffective disorder:No data recorded Duration of Psychotic Symptoms:No data recorded Hallucinations:Hallucinations: None  Ideas of Reference:None  Suicidal Thoughts:Suicidal Thoughts: Yes, Passive SI Passive Intent and/or Plan: Without Intent  Homicidal Thoughts:Homicidal Thoughts: No   Sensorium  Memory:Immediate Poor; Recent Poor; Remote Poor  Judgment:Impaired  Insight:Lacking   Executive Functions  Concentration:Fair  Attention Span:Fair  Recall:Poor  Fund of Knowledge:Fair  Language:Fair   Psychomotor Activity  Psychomotor Activity:Psychomotor Activity: Increased   Assets  Assets:Desire for Improvement; Resilience   Sleep  Sleep:Sleep: Good Number of Hours of Sleep: 6.75    Physical Exam: Physical Exam Vitals and nursing note reviewed.  HENT:     Head: Normocephalic and atraumatic.  Pulmonary:     Effort: Pulmonary effort is normal.  Neurological:     General: No focal deficit present.     Mental Status: He is alert and oriented to person, place, and time.   Review of Systems  Constitutional:  Positive for malaise/fatigue.  Blood pressure (!) 155/100, pulse (!) 111, temperature 99.3 F (37.4 C), temperature source Oral, resp. rate 18, SpO2 98 %. There is no height or weight on file to calculate  BMI.   COGNITIVE FEATURES THAT CONTRIBUTE TO RISK:  Thought constriction (tunnel vision)    SUICIDE RISK:   Minimal: No identifiable suicidal ideation.  Patients presenting with no risk factors but with morbid ruminations; may be classified as minimal risk based on the severity of the depressive symptoms  PLAN OF CARE: Patient is seen and examined.  Patient is a 45 year old male with the above-stated past psychiatric history who is admitted with suicidal ideation and possible drug withdrawal.  He will be admitted to the hospital.  He will be integrated in the milieu.  He will be encouraged to attend groups.  He was continued on furosemide, Neurontin, prazosin, Seroquel, sertraline and prazosin.  We will restart his azithromycin.  We will contact his outpatient Suboxone clinic to see if he will be continued on this.  If he is we will restart it.  Otherwise I will put him on the opiate detox protocol.  Review of his admission laboratories revealed normal electrolytes including liver function enzymes and creatinine.  His white blood cell count was elevated at 16.9.  Differential showed a mild elevation of his absolute neutrophil count at 9.0.  Acetaminophen was less than 10, salicylate less than 7.  Drug screen was positive for marijuana.  Blood alcohol was less than 10.  EKG showed a normal sinus rhythm with a normal QTc interval, but was a poor baseline.  We will repeat that.  His blood pressure is elevated at 155/100, pulse was 111.  He has low-grade fever at 99.3.  Pulse oximetry on room air was 98%.  I certify that inpatient services furnished can reasonably be expected to improve the patient's condition.   Tammy Sours  Addison Lank, MD 04/05/2021, 11:59 AM

## 2021-04-06 DIAGNOSIS — F315 Bipolar disorder, current episode depressed, severe, with psychotic features: Principal | ICD-10-CM

## 2021-04-06 LAB — HEMOGLOBIN A1C
Hgb A1c MFr Bld: 5.7 % — ABNORMAL HIGH (ref 4.8–5.6)
Mean Plasma Glucose: 117 mg/dL

## 2021-04-06 MED ORDER — QUETIAPINE FUMARATE 200 MG PO TABS
200.0000 mg | ORAL_TABLET | Freq: Every day | ORAL | Status: DC
  Administered 2021-04-06: 200 mg via ORAL
  Filled 2021-04-06 (×3): qty 1

## 2021-04-06 MED ORDER — QUETIAPINE FUMARATE 100 MG PO TABS
100.0000 mg | ORAL_TABLET | Freq: Every day | ORAL | Status: DC
  Filled 2021-04-06: qty 1

## 2021-04-06 MED ORDER — METOPROLOL TARTRATE 50 MG PO TABS
50.0000 mg | ORAL_TABLET | Freq: Two times a day (BID) | ORAL | Status: DC
  Administered 2021-04-06 – 2021-04-09 (×6): 50 mg via ORAL
  Filled 2021-04-06: qty 1
  Filled 2021-04-06: qty 2
  Filled 2021-04-06: qty 1
  Filled 2021-04-06: qty 2
  Filled 2021-04-06 (×2): qty 1
  Filled 2021-04-06: qty 2
  Filled 2021-04-06 (×6): qty 1

## 2021-04-06 MED ORDER — METOPROLOL TARTRATE 25 MG PO TABS
25.0000 mg | ORAL_TABLET | Freq: Two times a day (BID) | ORAL | Status: DC
  Administered 2021-04-06: 25 mg via ORAL
  Filled 2021-04-06 (×3): qty 1

## 2021-04-06 NOTE — Progress Notes (Signed)
[facility] MD Progress Note  04/06/2021 3:45 PM [name] [name]  MRN:  [id number]  Subjective: I'm still nervous, depressed & suicidal. I'm thinking about killing myself if I get let out of here when I'm not ready. I need medicines for high blood pressure because I have palpitations. I need medicines for high cholesterol because my cholesterol is hig. I have been depressed all my life. It is in my gene. I'm also abusing drugs which did not help my situation. I have always had a bad need to die. I need to stay here till Sunday so that I have time to get into a halfway house. I had an interview today with an [facility], waiting to hear their answer".  Objective: Patient is a 45 year old male with an unspecified past psychiatric history who presented on 04/03/2021 to the [facility] emergency department complaining of suicidal ideation as well as hallucinations.  He was noted to be continuously speaking to himself.  He stated he had a plan to hang himself.  The patient stated at that time that he had recurring thoughts of killing himself.  And he stated that he "have weapons in the woods, a gun and 2 bullets".   Daily notes: [name] [name] is seen, chart reviewed. The chart findings discussed with the treatment team. He presents alert, oriented x 4. He is visible on the unit talking to himself. He says he is still feeling very depressed, suicidal & hearing voices. He says he had a phone interview today with an [facility], hoping that they will accept. He is complaining that he has bad high blood pressure but has not resumed his metoprolol 25 mg bid as of yet. He is threatening that he will kill himself if he has to be discharged when he is not ready to be discharged. He continue to endorse SI, AH. He denies any HI, VH, delusional thoughts or paranoia. His Seroquel is increased from 50 mg to 100 mg po Q bedtime for mood control. Reviewed vital signs. Blood pressure is slightly elevated, started on Metoprolol 25  mg po bid. Pulse rate 113, sat 98%.  Principal Problem: Bipolar affective disorder (HCC)  Diagnosis: Principal Problem:   Bipolar affective disorder (HCC) Active Problems:   MDD (major depressive disorder), single episode, severe with psychotic features (HCC)  Total Time spent with patient:  25 minutes  Past Psychiatric History: See H&P  Past Medical History:  Past Medical History:  Diagnosis Date   Bipolar disorder (HCC)    Cellulitis    Depression    Drug abuse (HCC)    Hepatitis C     Past Surgical History:  Procedure Laterality Date   BACK SURGERY     ROTATOR CUFF REPAIR     SHOULDER SURGERY     Family History:  Family History  Problem Relation Age of Onset   Alcohol abuse Father    Cancer Maternal Aunt    Family Psychiatric  History: See H&P  Social History:  Social History   Substance and Sexual Activity  Alcohol Use Not Currently   Alcohol/week: 12.0 standard drinks   Types: 12 Cans of beer per week   Comment: heavy     Social History   Substance and Sexual Activity  Drug Use Not Currently   Types: Marijuana, Benzodiazepines, Other-see comments, Methamphetamines, Cocaine    Social History   Socioeconomic History   Marital status: Divorced    Spouse name: Not on file   Number of children: Not  on file   Years of education: Not on file   Highest education level: Not on file  Occupational History   Not on file  Tobacco Use   Smoking status: Every Day    Packs/day: 1.00    Years: 31.00    Pack years: 31.00    Types: Cigarettes    Start date: 10/22/1981   Smokeless tobacco: Never  Vaping Use   Vaping Use: Never used  Substance and Sexual Activity   Alcohol use: Not Currently    Alcohol/week: 12.0 standard drinks    Types: 12 Cans of beer per week    Comment: heavy   Drug use: Not Currently    Types: Marijuana, Benzodiazepines, Other-see comments, Methamphetamines, Cocaine   Sexual activity: Not Currently  Other Topics Concern   Not on  file  Social History Narrative   Not on file   Social Determinants of Health   Financial Resource Strain: Not on file  Food Insecurity: Not on file  Transportation Needs: Not on file  Physical Activity: Not on file  Stress: Not on file  Social Connections: Not on file   Additional Social History:   Sleep: Good  Appetite:  Good  Current Medications: Current Facility-Administered Medications  Medication Dose Route Frequency Provider Last Rate Last Admin   acetaminophen (TYLENOL) tablet 650 mg  650 mg Oral Q6H PRN Novella Olive, NP   650 mg at 04/05/21 0928   alum & mag hydroxide-simeth (MAALOX/MYLANTA) 200-200-20 MG/5ML suspension 30 mL  30 mL Oral Q4H PRN Novella Olive, NP   30 mL at 04/05/21 1206   azithromycin (ZITHROMAX) tablet 250 mg  250 mg Oral Daily Antonieta Pert, MD   250 mg at 04/06/21 0347   cloNIDine (CATAPRES) tablet 0.1 mg  0.1 mg Oral QID Antonieta Pert, MD   0.1 mg at 04/06/21 1300   Followed by   Melene Muller ON 04/07/2021] cloNIDine (CATAPRES) tablet 0.1 mg  0.1 mg Oral BH-qamhs Antonieta Pert, MD       Followed by   Melene Muller ON 04/09/2021] cloNIDine (CATAPRES) tablet 0.1 mg  0.1 mg Oral QAC breakfast Antonieta Pert, MD       dicyclomine (BENTYL) tablet 20 mg  20 mg Oral Q6H PRN Antonieta Pert, MD       feeding supplement (ENSURE ENLIVE / ENSURE PLUS) liquid 237 mL  237 mL Oral Q24H Antonieta Pert, MD   237 mL at 04/05/21 2147   furosemide (LASIX) tablet 20 mg  20 mg Oral BID Antonieta Pert, MD   20 mg at 04/06/21 0843   gabapentin (NEURONTIN) capsule 800 mg  800 mg Oral TID Antonieta Pert, MD   800 mg at 04/06/21 1300   hydrOXYzine (ATARAX/VISTARIL) tablet 25 mg  25 mg Oral Q6H PRN Antonieta Pert, MD   25 mg at 04/06/21 4259   loperamide (IMODIUM) capsule 2-4 mg  2-4 mg Oral PRN Antonieta Pert, MD       magnesium hydroxide (MILK OF MAGNESIA) suspension 30 mL  30 mL Oral Daily PRN Novella Olive, NP   30 mL at 04/05/21 2145    methocarbamol (ROBAXIN) tablet 500 mg  500 mg Oral Q8H PRN Antonieta Pert, MD       multivitamin with minerals tablet 1 tablet  1 tablet Oral Daily Antonieta Pert, MD   1 tablet at 04/06/21 0844   naproxen (NAPROSYN) tablet 500 mg  500 mg Oral BID  PRN Antonieta Pertlary, Greg Lawson, MD   500 mg at 04/06/21 0851   nicotine (NICODERM CQ - dosed in mg/24 hours) patch 21 mg  21 mg Transdermal Daily Antonieta Pertlary, Greg Lawson, MD   21 mg at 04/06/21 0845   ondansetron (ZOFRAN-ODT) disintegrating tablet 4 mg  4 mg Oral Q6H PRN Antonieta Pertlary, Greg Lawson, MD       prazosin (MINIPRESS) capsule 1 mg  1 mg Oral QHS Antonieta Pertlary, Greg Lawson, MD   1 mg at 04/05/21 2146   QUEtiapine (SEROQUEL) tablet 100 mg  100 mg Oral QHS Armandina StammerNwoko, Aleece Loyd I, NP       sertraline (ZOLOFT) tablet 100 mg  100 mg Oral Daily Antonieta Pertlary, Greg Lawson, MD   100 mg at 04/06/21 0843   traZODone (DESYREL) tablet 50 mg  50 mg Oral QHS PRN Novella Oliveolby, Karen R, NP   50 mg at 04/04/21 2107    Lab Results:  Results for orders placed or performed during the hospital encounter of 04/04/21 (from the past 48 hour(s))  Lipid panel     Status: None   Collection Time: 04/05/21  6:34 PM  Result Value Ref Range   Cholesterol 128 0 - 200 mg/dL   Triglycerides 161110 <096<150 mg/dL   HDL 42 >04>40 mg/dL   Total CHOL/HDL Ratio 3.0 RATIO   VLDL 22 0 - 40 mg/dL   LDL Cholesterol 64 0 - 99 mg/dL    Comment:        Total Cholesterol/HDL:CHD Risk Coronary Heart Disease Risk Table                     Men   Women  1/2 Average Risk   3.4   3.3  Average Risk       5.0   4.4  2 X Average Risk   9.6   7.1  3 X Average Risk  23.4   11.0        Use the calculated Patient Ratio above and the CHD Risk Table to determine the patient's CHD Risk.        ATP III CLASSIFICATION (LDL):  <100     mg/dL   Optimal  540-981100-129  mg/dL   Near or Above                    Optimal  130-159  mg/dL   Borderline  191-478160-189  mg/dL   High  >295>190     mg/dL   Very High Performed at [facility], [address] [place], [place] [address]   TSH     Status: None   Collection Time: 04/05/21  6:34 PM  Result Value Ref Range   TSH 0.982 0.350 - 4.500 uIU/mL    Comment: Performed by a 3rd Generation assay with a functional sensitivity of <=0.01 uIU/mL. Performed at [facility], [address] [place], [place] [address]     Blood Alcohol level:  Lab Results  Component Value Date   [facility]   ETH <10 01/24/2018    Metabolic Disorder Labs: Lab Results  Component Value Date   HGBA1C 5.7 (H) 06/18/2017   MPG 116.89 06/18/2017   MPG 120 12/03/2016   No results found for: PROLACTIN Lab Results  Component Value Date   CHOL 128 04/05/2021   TRIG 110 04/05/2021   HDL 42 04/05/2021   CHOLHDL 3.0 04/05/2021   VLDL 22 04/05/2021   LDLCALC 64 04/05/2021   LDLCALC  76 06/18/2017    Physical Findings: AIMS:  , ,  ,  ,    CIWA:  CIWA-Ar Total: 4 COWS:  COWS Total Score: 0  Musculoskeletal: Strength & Muscle Tone: within normal limits Gait & Station: normal Patient leans: N/A  Psychiatric Specialty Exam:  Presentation  General Appearance: Disheveled  Eye Contact:Fair  Speech:Normal Rate  Speech Volume:Increased  Handedness:Right   Mood and Affect  Mood:Anxious; Dysphoric  Affect:Congruent   Thought Process  Thought Processes:Goal Directed  Descriptions of Associations:Circumstantial  Orientation:Full (Time, Place and Person)  Thought Content:Rumination  History of Schizophrenia/Schizoaffective disorder:No data recorded Duration of Psychotic Symptoms:No data recorded Hallucinations:Hallucinations: None  Ideas of Reference:None  Suicidal Thoughts:Suicidal Thoughts: Yes, Passive SI Passive Intent and/or Plan: Without Intent  Homicidal Thoughts:Homicidal Thoughts: No   Sensorium  Memory:Immediate Poor; Recent Poor; Remote Poor  Judgment:Impaired  Insight:Lacking   Executive Functions  Concentration:Fair  Attention  Span:Fair  Recall:Poor  Fund of Knowledge:Fair  Language:Fair   Psychomotor Activity  Psychomotor Activity:Psychomotor Activity: Increased   Assets  Assets:Desire for Improvement; Resilience   Sleep  Sleep:Sleep: Good Number of Hours of Sleep: 6.75  Physical Exam: Physical Exam Vitals and nursing note reviewed.  HENT:     Head: Normocephalic.     Nose: Nose normal.     Mouth/Throat:     Pharynx: Oropharynx is clear.  Eyes:     Pupils: Pupils are equal, round, and reactive to light.  Cardiovascular:     Rate and Rhythm: Normal rate.  Pulmonary:     Effort: Pulmonary effort is normal.  Genitourinary:    Comments: Deferred Musculoskeletal:        General: Normal range of motion.     Cervical back: Normal range of motion.  Skin:    General: Skin is warm and dry.  Neurological:     General: No focal deficit present.     Mental Status: He is alert and oriented to person, place, and time. Mental status is at baseline.   Review of Systems  Constitutional: Negative.   HENT: Negative.    Eyes: Negative.   Respiratory: Negative.    Cardiovascular: Negative.   Gastrointestinal: Negative.   Genitourinary: Negative.   Musculoskeletal: Negative.   Skin: Negative.   Neurological: Negative.   Endo/Heme/Allergies: Negative.   Psychiatric/Behavioral:  Positive for depression, hallucinations, substance abuse and suicidal ideas. Negative for memory loss. The patient is nervous/anxious. The patient does not have insomnia.   Blood pressure (!) 149/81, pulse (!) 113, temperature 98.8 F (37.1 C), temperature source Oral, resp. rate 16, SpO2 98 %. There is no height or weight on file to calculate BMI.  Treatment Plan Summary: Daily contact with patient to assess and evaluate symptoms and progress in treatment and Medication management.   Continue inpatient hospitalization.  Will continue today 04/06/2021 plan as below except where it is noted.   Mood control.  Increased  Seroquel from 50 mg to 100 mg po Q hs.  Depression.  Continue Sertraline 100 mg po daily.  Anxiety. Continue Vistaril 25 mg po Q 6 hrs prn x 5 days.  Nightmares.  Continue Minipress 1 mg po Q hs.  Initiate Metoprolol 25 mg po bid for elevated blood pressure.  Opioid withdrawal management.  Continue the Clonidine detox protocols.  Continue other prn medications as recommended for all other medical complaints. Encourage group participation. Discharge disposition plan in progress.  Armandina Stammer, NP, pmhnp, fnp-bc 04/06/2021, 3:45 PM

## 2021-04-06 NOTE — Progress Notes (Signed)
Patient is disorganized, irritable with Peers and Staff, continues to be very suspicious  pacing and c/o anxiety Prn Vistaril 25 mg PO administered at 2137. Medication effective by 2230. Support and encouragement provided as needed. Q 15 minutes safety checks ongoing without self harm gestures.    04/06/21 2300  Psych Admission Type (Psych Patients Only)  Admission Status Involuntary  Psychosocial Assessment  Patient Complaints Anxiety;Irritability;Substance abuse;Depression  Eye Contact Brief;Darting  Facial Expression Animated;Anxious  Affect Labile  Speech Pressured  Interaction Avoidant  Motor Activity Restless  Appearance/Hygiene Disheveled;Poor hygiene  Behavior Characteristics Unwilling to participate;Anxious;Impulsive;Irritable  Mood Suspicious;Irritable  Thought Process  Coherency WDL  Content Paranoia  Delusions None reported or observed  Perception Hallucinations  Hallucination Auditory (Talking to unseen others in hallway)  Judgment Poor  Confusion None  Danger to Self  Current suicidal ideation? Denies ("SI by shooting self")  Self-Injurious Behavior No self-injurious ideation or behavior indicators observed or expressed   Agreement Not to Harm Self Yes  Description of Agreement verbally contract to notify staff  Danger to Others  Danger to Others None reported or observed

## 2021-04-06 NOTE — Progress Notes (Signed)
Progress Note:    04/06/21 1300  Psych Admission Type (Psych Patients Only)  Admission Status Involuntary  Psychosocial Assessment  Patient Complaints Anxiety;Depression  Eye Contact Brief;Darting  Facial Expression Animated;Anxious  Affect Labile  Speech Pressured  Interaction Avoidant  Motor Activity Restless  Appearance/Hygiene Disheveled;Poor hygiene  Behavior Characteristics Pacing;Anxious;Impulsive  Mood Depressed;Anxious  Thought Process  Coherency WDL  Content Paranoia  Delusions None reported or observed  Perception Hallucinations  Hallucination Auditory (Talking to unseen others in hallway)  Judgment Poor  Confusion None  Danger to Self  Current suicidal ideation? Denies ("SI by shooting self")  Self-Injurious Behavior No self-injurious ideation or behavior indicators observed or expressed   Agreement Not to Harm Self Yes  Description of Agreement verbally contract to notify staff  Danger to Others  Danger to Others None reported or observed

## 2021-04-06 NOTE — Progress Notes (Signed)
   04/05/21 2300  Psych Admission Type (Psych Patients Only)  Admission Status Involuntary  Psychosocial Assessment  Patient Complaints Anxiety;Depression  Eye Contact Brief;Darting  Facial Expression Animated;Anxious  Affect Labile  Speech Pressured  Interaction Avoidant  Motor Activity Restless  Appearance/Hygiene In scrubs;Disheveled;Poor hygiene  Behavior Characteristics Anxious  Mood Depressed;Anxious  Thought Process  Coherency WDL  Content Paranoia  Delusions None reported or observed  Perception Hallucinations  Hallucination Auditory (Talking to unseen others in hallway)  Judgment Poor  Confusion None  Danger to Self  Current suicidal ideation? Denies ("SI by shooting self")  Self-Injurious Behavior No self-injurious ideation or behavior indicators observed or expressed   Agreement Not to Harm Self Yes  Description of Agreement verbally contract to notify staff  Danger to Others  Danger to Others None reported or observed

## 2021-04-06 NOTE — [facility] Group Notes (Signed)
[facility] Group Notes:  (Nursing/MHT/Case Management/Adjunct)  Date:  04/06/2021  Time:  10:34 AM  Type of Therapy:   Goals group  Participation Level:  Minimal  Participation Quality:  Inattentive  Affect:  Flat  Cognitive:  Disorganized and Hallucinating  Insight:  Lacking  Engagement in Group:  Distracting, Limited, and Off Topic  Modes of Intervention:  Discussion and Education  Summary of Progress/Problems:  He reported that he is good with his medications and his goal for today is "listen to people, medications, prayer."  He was talking loudly to himself and making bizarre hand gestures during group.   [name] [name] [name] [name] 04/06/2021, 10:34 AM

## 2021-04-06 NOTE — Progress Notes (Signed)
Psychoeducational Group Note  Date:  04/06/2021 Time:  2015  Group Topic/Focus:  Wrap up group  Participation Level: Did Not Attend  Participation Quality:  Not Applicable  Affect:  Not Applicable  Cognitive:  Not Applicable  Insight:  Not Applicable  Engagement in Group: Not Applicable  Additional Comments:  Did not attend.   Marcille Buffy 04/06/2021, 10:19 PM

## 2021-04-07 MED ORDER — QUETIAPINE FUMARATE 50 MG PO TABS
50.0000 mg | ORAL_TABLET | Freq: Every day | ORAL | Status: DC
  Administered 2021-04-07: 50 mg via ORAL
  Filled 2021-04-07 (×3): qty 1

## 2021-04-07 MED ORDER — QUETIAPINE FUMARATE 400 MG PO TABS
400.0000 mg | ORAL_TABLET | Freq: Every day | ORAL | Status: DC
  Filled 2021-04-07 (×2): qty 1

## 2021-04-07 MED ORDER — QUETIAPINE FUMARATE 100 MG PO TABS
100.0000 mg | ORAL_TABLET | Freq: Every day | ORAL | Status: DC
  Administered 2021-04-07: 100 mg via ORAL
  Filled 2021-04-07 (×3): qty 1

## 2021-04-07 MED ORDER — QUETIAPINE FUMARATE 200 MG PO TABS: 200.0000 mg | ORAL_TABLET | Freq: Every day | ORAL | Status: DC

## 2021-04-07 NOTE — Progress Notes (Signed)
[facility] MD Progress Note  04/07/2021 11:49 AM BRICE KOSSMAN  MRN:  [id number]  Subjective: Jillyn Hidden reports, "I'm feeling too tired since last night. I think it is coming from the Seroquel 200 mg I started last night. I think it is too strong for me. I'm still feeling suicidal & depressed because of my life condition. I would like to have a better life, stay sober, enroll in a bible college to obtain a seminary degree. That is what I would like future to be".  Objective: Patient is a 45 year old male with an unspecified past psychiatric history who presented on 04/03/2021 to the [facility] emergency department complaining of suicidal ideation as well as hallucinations.  He was noted to be continuously speaking to himself.  He stated he had a plan to hang himself.  The patient stated at that time that he had recurring thoughts of killing himself.  And he stated that he "have weapons in the woods, a gun and 2 bullets".   Daily notes: Ricardo is seen, chart reviewed. The chart findings discussed with the treatment team. He presents alert, oriented x 4. He is visible on the unit talking to himself. He says he is still feeling very depressed & suicidal, but denies hearing voices or seeing things today. He complained of feeling very tired, blamed it on the Seroquel 200 mg he started last night. He was informed that the adjustment of his Seroquel dose was based on the fact that he continues to talk to himself, presenting as being psychotic & responding to internal stimuli. He is encouraged to take his medications as recommended to help stabilize his symptoms. Just right after lunch this afternoon, Kayin was observed banging his head on the wall & on the door. He was transferred to the 500-hall for his safety & where he will be closely monitored. Jervis says he would like his life to be better than what it is now. He says he would like to be sober from substances, enroll in a bible college & obtain a seminary  degree. Reviewed vital signs. B/p 108/81, Pulse rate 76, sat 98%.  Principal Problem: Bipolar affective disorder (HCC)  Diagnosis: Principal Problem:   Bipolar affective disorder (HCC) Active Problems:   MDD (major depressive disorder), single episode, severe with psychotic features (HCC)  Total Time spent with patient:  25 minutes  Past Psychiatric History: See H&P  Past Medical History:  Past Medical History:  Diagnosis Date   Bipolar disorder (HCC)    Cellulitis    Depression    Drug abuse (HCC)    Hepatitis C     Past Surgical History:  Procedure Laterality Date   BACK SURGERY     ROTATOR CUFF REPAIR     SHOULDER SURGERY     Family History:  Family History  Problem Relation Age of Onset   Alcohol abuse Father    Cancer Maternal Aunt    Family Psychiatric  History: See H&P  Social History:  Social History   Substance and Sexual Activity  Alcohol Use Not Currently   Alcohol/week: 12.0 standard drinks   Types: 12 Cans of beer per week   Comment: heavy     Social History   Substance and Sexual Activity  Drug Use Not Currently   Types: Marijuana, Benzodiazepines, Other-see comments, Methamphetamines, Cocaine    Social History   Socioeconomic History   Marital status: Divorced    Spouse name: Not on file   Number of children: Not  on file   Years of education: Not on file   Highest education level: Not on file  Occupational History   Not on file  Tobacco Use   Smoking status: Every Day    Packs/day: 1.00    Years: 31.00    Pack years: 31.00    Types: Cigarettes    Start date: 10/22/1981   Smokeless tobacco: Never  Vaping Use   Vaping Use: Never used  Substance and Sexual Activity   Alcohol use: Not Currently    Alcohol/week: 12.0 standard drinks    Types: 12 Cans of beer per week    Comment: heavy   Drug use: Not Currently    Types: Marijuana, Benzodiazepines, Other-see comments, Methamphetamines, Cocaine   Sexual activity: Not Currently   Other Topics Concern   Not on file  Social History Narrative   Not on file   Social Determinants of Health   Financial Resource Strain: Not on file  Food Insecurity: Not on file  Transportation Needs: Not on file  Physical Activity: Not on file  Stress: Not on file  Social Connections: Not on file   Additional Social History:   Sleep: Good  Appetite:  Good  Current Medications: Current Facility-Administered Medications  Medication Dose Route Frequency Provider Last Rate Last Admin   acetaminophen (TYLENOL) tablet 650 mg  650 mg Oral Q6H PRN Novella Olive, NP   650 mg at 04/05/21 0928   alum & mag hydroxide-simeth (MAALOX/MYLANTA) 200-200-20 MG/5ML suspension 30 mL  30 mL Oral Q4H PRN Novella Olive, NP   30 mL at 04/05/21 1206   azithromycin (ZITHROMAX) tablet 250 mg  250 mg Oral Daily Antonieta Pert, MD   250 mg at 04/07/21 5053   cloNIDine (CATAPRES) tablet 0.1 mg  0.1 mg Oral BH-qamhs Antonieta Pert, MD   0.1 mg at 04/07/21 9767   Followed by   Melene Muller ON 04/09/2021] cloNIDine (CATAPRES) tablet 0.1 mg  0.1 mg Oral QAC breakfast Antonieta Pert, MD       dicyclomine (BENTYL) tablet 20 mg  20 mg Oral Q6H PRN Antonieta Pert, MD       feeding supplement (ENSURE ENLIVE / ENSURE PLUS) liquid 237 mL  237 mL Oral Q24H Antonieta Pert, MD   237 mL at 04/06/21 1807   furosemide (LASIX) tablet 20 mg  20 mg Oral BID Antonieta Pert, MD   20 mg at 04/07/21 3419   gabapentin (NEURONTIN) capsule 800 mg  800 mg Oral TID Antonieta Pert, MD   800 mg at 04/07/21 3790   hydrOXYzine (ATARAX/VISTARIL) tablet 25 mg  25 mg Oral Q6H PRN Antonieta Pert, MD   25 mg at 04/07/21 0825   loperamide (IMODIUM) capsule 2-4 mg  2-4 mg Oral PRN Antonieta Pert, MD       magnesium hydroxide (MILK OF MAGNESIA) suspension 30 mL  30 mL Oral Daily PRN Novella Olive, NP   30 mL at 04/07/21 0825   methocarbamol (ROBAXIN) tablet 500 mg  500 mg Oral Q8H PRN Antonieta Pert, MD   500 mg  at 04/06/21 1813   metoprolol tartrate (LOPRESSOR) tablet 50 mg  50 mg Oral BID Antonieta Pert, MD   50 mg at 04/07/21 2409   multivitamin with minerals tablet 1 tablet  1 tablet Oral Daily Antonieta Pert, MD   1 tablet at 04/07/21 7353   naproxen (NAPROSYN) tablet 500 mg  500 mg Oral BID  PRN Antonieta Pert, MD   500 mg at 04/06/21 0851   nicotine (NICODERM CQ - dosed in mg/24 hours) patch 21 mg  21 mg Transdermal Daily Antonieta Pert, MD   21 mg at 04/07/21 0826   ondansetron (ZOFRAN-ODT) disintegrating tablet 4 mg  4 mg Oral Q6H PRN Antonieta Pert, MD       prazosin (MINIPRESS) capsule 1 mg  1 mg Oral QHS Antonieta Pert, MD   1 mg at 04/06/21 2138   QUEtiapine (SEROQUEL) tablet 400 mg  400 mg Oral QHS Antonieta Pert, MD       QUEtiapine (SEROQUEL) tablet 50 mg  50 mg Oral Daily Antonieta Pert, MD   50 mg at 04/07/21 0951   sertraline (ZOLOFT) tablet 100 mg  100 mg Oral Daily Antonieta Pert, MD   100 mg at 04/07/21 1610   traZODone (DESYREL) tablet 50 mg  50 mg Oral QHS PRN Novella Olive, NP   50 mg at 04/04/21 2107    Lab Results:  Results for orders placed or performed during the hospital encounter of 04/04/21 (from the past 48 hour(s))  Hemoglobin A1c     Status: Abnormal   Collection Time: 04/05/21  6:34 PM  Result Value Ref Range   Hgb A1c MFr Bld 5.7 (H) 4.8 - 5.6 %    Comment: (NOTE)         Prediabetes: 5.7 - 6.4         Diabetes: >6.4         Glycemic control for adults with diabetes: <7.0    Mean Plasma Glucose 117 mg/dL    Comment: (NOTE) Performed At: [facility] [address] [place], [place] [id number] Jolene Schimke MD JX:[id number]   Lipid panel     Status: None   Collection Time: 04/05/21  6:34 PM  Result Value Ref Range   Cholesterol 128 0 - 200 mg/dL   Triglycerides 130 <865 mg/dL   HDL 42 >78 mg/dL   Total CHOL/HDL Ratio 3.0 RATIO   VLDL 22 0 - 40 mg/dL   LDL Cholesterol 64 0 - 99 mg/dL    Comment:         Total Cholesterol/HDL:CHD Risk Coronary Heart Disease Risk Table                     Men   Women  1/2 Average Risk   3.4   3.3  Average Risk       5.0   4.4  2 X Average Risk   9.6   7.1  3 X Average Risk  23.4   11.0        Use the calculated Patient Ratio above and the CHD Risk Table to determine the patient's CHD Risk.        ATP III CLASSIFICATION (LDL):  <100     mg/dL   Optimal  469-629  mg/dL   Near or Above                    Optimal  130-159  mg/dL   Borderline  528-413  mg/dL   High  >244     mg/dL   Very High Performed at [facility], [address] [place], [place] [address]   TSH     Status: None   Collection Time: 04/05/21  6:34 PM  Result Value Ref Range   TSH 0.982 0.350 - 4.500 uIU/mL  Comment: Performed by a 3rd Generation assay with a functional sensitivity of <=0.01 uIU/mL. Performed at [facility], [address], [place], [place] [address]     Blood Alcohol level:  Lab Results  Component Value Date   ETH <10 04/03/2021   ETH <10 01/24/2018    Metabolic Disorder Labs: Lab Results  Component Value Date   HGBA1C 5.7 (H) 04/05/2021   MPG 117 04/05/2021   MPG 116.89 06/18/2017   No results found for: PROLACTIN Lab Results  Component Value Date   CHOL 128 04/05/2021   TRIG 110 04/05/2021   HDL 42 04/05/2021   CHOLHDL 3.0 04/05/2021   VLDL 22 04/05/2021   LDLCALC 64 04/05/2021   LDLCALC 76 06/18/2017    Physical Findings: AIMS:  , ,  ,  ,    CIWA:  CIWA-Ar Total: 4 COWS:  COWS Total Score: 2  Musculoskeletal: Strength & Muscle Tone: within normal limits Gait & Station: normal Patient leans: N/A  Psychiatric Specialty Exam:  Presentation  General Appearance: Disheveled  Eye Contact:Fair  Speech:Normal Rate  Speech Volume:Increased  Handedness:Right   Mood and Affect  Mood:Anxious; Dysphoric  Affect:Congruent   Thought Process  Thought Processes:Goal  Directed  Descriptions of Associations:Circumstantial  Orientation:Full (Time, Place and Person)  Thought Content:Rumination  History of Schizophrenia/Schizoaffective disorder:No data recorded Duration of Psychotic Symptoms:No data recorded Hallucinations:No data recorded  Ideas of Reference:None  Suicidal Thoughts:No data recorded  Homicidal Thoughts:No data recorded  Sensorium  Memory:Immediate Poor; Recent Poor; Remote Poor  Judgment:Impaired  Insight:Lacking  Executive Functions  Concentration:Fair  Attention Span:Fair  Recall:Poor  Fund of Knowledge:Fair  Language:Fair  Psychomotor Activity  Psychomotor Activity:No data recorded   Assets  Assets:Desire for Improvement; Resilience  Sleep  Sleep:No data recorded  Physical Exam: Physical Exam Vitals and nursing note reviewed.  HENT:     Head: Normocephalic.     Nose: Nose normal.     Mouth/Throat:     Pharynx: Oropharynx is clear.  Eyes:     Pupils: Pupils are equal, round, and reactive to light.  Cardiovascular:     Rate and Rhythm: Normal rate.  Pulmonary:     Effort: Pulmonary effort is normal.  Genitourinary:    Comments: Deferred Musculoskeletal:        General: Normal range of motion.     Cervical back: Normal range of motion.  Skin:    General: Skin is warm and dry.  Neurological:     General: No focal deficit present.     Mental Status: He is alert and oriented to person, place, and time. Mental status is at baseline.   Review of Systems  Constitutional: Negative.   HENT: Negative.    Eyes: Negative.   Respiratory: Negative.    Cardiovascular: Negative.   Gastrointestinal: Negative.   Genitourinary: Negative.   Musculoskeletal: Negative.   Skin: Negative.   Neurological: Negative.   Endo/Heme/Allergies: Negative.   Psychiatric/Behavioral:  Positive for depression, hallucinations, substance abuse and suicidal ideas. Negative for memory loss. The patient is nervous/anxious.  The patient does not have insomnia.   Blood pressure 108/81, pulse 76, temperature 97.9 F (36.6 C), temperature source Oral, resp. rate 18, SpO2 98 %. There is no height or weight on file to calculate BMI.  Treatment Plan Summary: Daily contact with patient to assess and evaluate symptoms and progress in treatment and Medication management.   Continue inpatient hospitalization.  Will continue today 04/07/2021 plan as below except where it is noted.  Mood control.  Continue Seroquel 200 mg po Q hs.  Depression.  Continue Sertraline 100 mg po daily.  Anxiety. Continue Vistaril 25 mg po Q 6 hrs prn x 5 days.  Nightmares.  Continue Minipress 1 mg po Q hs.  Other medical issue. Metoprolol 25 mg po bid for elevated blood pressure.  Opioid withdrawal management.  Continue the Clonidine detox protocols.  Continue other prn medications as recommended for all other medical complaints. Encourage group participation. Discharge disposition plan in progress.  Armandina StammerAgnes Abra Lingenfelter, NP, pmhnp, fnp-bc 04/07/2021, 11:49 AM

## 2021-04-07 NOTE — Progress Notes (Signed)
Recreation Therapy Notes  Date:  6.17.22 Time: 0930 Location: 300 Hall Dayroom  Group Topic: Stress Management  Goal Area(s) Addresses:  Patient will identify positive stress management techniques. Patient will identify benefits of using stress management post d/c.  Behavioral Response: Attentive  Intervention: Stress Management  Activity :  Meditation.  LRT played on meditation that focused on looking at each day as a new opportunity to do something new and be productive.  Education:  Stress Management, Discharge Planning.   Education Outcome: Acknowledges Education  Clinical Observations/Feedback:  Pt attended and participated.  Pt had no questions and expressed no concerns.    Peter Keyworth, LRT/CTRS         Sandon Yoho A 04/07/2021 11:19 AM 

## 2021-04-07 NOTE — Progress Notes (Signed)
Adult Psychoeducational Group Note  Date:  04/07/2021 Time:  10:26 PM  Group Topic/Focus:  Wrap-Up Group:   The focus of this group is to help patients review their daily goal of treatment and discuss progress on daily workbooks.  Participation Level:  Did Not Attend  Participation Quality:  Did Not Attend  Affect:  Did Not Attend  Cognitive:  Did Not Attend  Insight: None  Engagement in Group:  Did Not Attend  Modes of Intervention:  Did Not Attend  Additional Comments:  Pt did not attend evening wrap up group tonight.  Felipa Furnace 04/07/2021,

## 2021-04-07 NOTE — Progress Notes (Signed)
Pt stayed in his room much of the evening. Pt came to the medication window and was very argumentative about his medications" I'm not taking Seroquel, 400 mg makes mee too sleepy" pt was finally convinced he was only taking 100 mg and pt took PRN trazodone with HS medications per MAR. Pt appeared very attention seeking and tried to be manipulative    04/07/21 2100  Psych Admission Type (Psych Patients Only)  Admission Status Involuntary  Psychosocial Assessment  Patient Complaints Anxiety;Agitation  Eye Contact Brief;Darting  Facial Expression Animated;Anxious  Affect Labile  Speech Pressured;Argumentative  Interaction Avoidant  Motor Activity Restless  Appearance/Hygiene Disheveled;Poor hygiene  Behavior Characteristics Agitated  Mood Anxious;Labile  Thought Process  Coherency WDL  Content Paranoia  Delusions None reported or observed  Perception Hallucinations  Hallucination Auditory (Talking to unseen others in hallway and room)  Judgment Poor  Confusion None  Danger to Self  Current suicidal ideation? Active  Self-Injurious Behavior No self-injurious ideation or behavior indicators observed or expressed   Agreement Not to Harm Self Yes  Description of Agreement verbally contract to notify staff  Danger to Others  Danger to Others None reported or observed

## 2021-04-07 NOTE — [facility] Group Notes (Addendum)
[facility] Group Notes:  (Nursing/MHT/Case Management/Adjunct)   Date:  04/07/2021  Time:  11:55 AM   Type of Therapy:   Goals group   Participation Level:  Did Not Attend   Participation Quality:   Did not attend   Affect:       Cognitive:       Insight:     Engagement in Group:   Did not attend   Modes of Intervention:  Discussion and Education   Summary of Progress/Problems:   [name] [name] [name] [name] 04/07/2021, 11:55 AM 

## 2021-04-07 NOTE — Progress Notes (Signed)
Pt in room standing by bed, pacing, and talking to someone whom was not there for four consecutive fifteen minute checks. Pt was asked if he was ok or if he needed anything. Pt responded that he was fine. At the 2300 safety check pt was lying in bed with eyes closed and lightly snoring.

## 2021-04-07 NOTE — Progress Notes (Signed)
Pt transferred to 500 hall because pt displayed behavioral problems on the 300 hall.  Pt has adjusted well to being on the 500 hall thus far. RN will continue to monitor and provide support as needed.

## 2021-04-08 LAB — BASIC METABOLIC PANEL
Anion gap: 9 (ref 5–15)
BUN: 29 mg/dL — ABNORMAL HIGH (ref 6–20)
CO2: 30 mmol/L (ref 22–32)
Calcium: 9.7 mg/dL (ref 8.9–10.3)
Chloride: 103 mmol/L (ref 98–111)
Creatinine, Ser: 0.92 mg/dL (ref 0.61–1.24)
GFR, Estimated: >60 mL/min (ref >60)
Glucose, Bld: 67 mg/dL — ABNORMAL LOW (ref 70–99)
Potassium: 4.3 mmol/L (ref 3.5–5.1)
Sodium: 142 mmol/L (ref 135–145)

## 2021-04-08 MED ORDER — QUETIAPINE FUMARATE 50 MG PO TABS
50.0000 mg | ORAL_TABLET | Freq: Every day | ORAL | Status: DC
  Administered 2021-04-08: 50 mg via ORAL
  Filled 2021-04-08 (×2): qty 1

## 2021-04-08 MED ORDER — ZIPRASIDONE MESYLATE 20 MG IM SOLR: 20.0000 mg | Freq: Two times a day (BID) | INTRAMUSCULAR | Status: DC | PRN

## 2021-04-08 MED ORDER — DOXEPIN HCL 10 MG PO CAPS
10.0000 mg | ORAL_CAPSULE | Freq: Every day | ORAL | Status: DC
  Administered 2021-04-08: 10 mg via ORAL
  Filled 2021-04-08 (×2): qty 1

## 2021-04-08 MED ORDER — OLANZAPINE 10 MG PO TBDP: 10.0000 mg | ORAL_TABLET | Freq: Three times a day (TID) | ORAL | Status: DC | PRN

## 2021-04-08 NOTE — Progress Notes (Signed)
Dar Note: Patient presents with anxious affect and irritable mood.  Denies suicidal thoughts, auditory and visual hallucinations.  Observed pacing the hallway and dayroom; getting coffee then pouring it in the garbage can.  Patient became verbally aggressive when redirected about his bizarre behaviors.  Invited to group but chose not to attend.  Routine safety checks maintained.  Patient is safe on and off the unit.

## 2021-04-08 NOTE — Progress Notes (Signed)
Pt was up responding to internal stimuli numerous times through the night. Pt heard talking to people not seen by staff.

## 2021-04-08 NOTE — [facility] Group Notes (Signed)
[facility] Group Notes: (Clinical Social Work)   04/08/2021      Type of Therapy:  Group Therapy   Participation Level:  Did Not Attend - was invited both individually by MHT and by overhead announcement, chose not to attend.  The patient came into group for about 5 minutes, kept getting up and walking around, then abruptly left the group.  He never spoke during the short time there.   Ambrose Mantle, LCSW 04/08/2021, 3:14 PM

## 2021-04-08 NOTE — Progress Notes (Signed)
Pt came to the nursing station demanding a muscle relaxant, writer informed he did not have a muscle relaxant. Pt began to get verbally aggressive threatening Clinical research associate. Writer tried to explain that the muscle relaxant falls off as the detox protocol ends. Writer offered pt Naproxen , pt stated he had the medication yesterday . Pt stormed off and went to his room. Pt came back a few minutes later and apologized for his behavior and writer was able to explain the information the pt would not allow previously. Writer educated on talking to the doctor about the issues with his back, Clinical research associate tried to talk about non pharmacological methods to help with his back pain, but pt was not receptive to anything other than medication at this time.

## 2021-04-08 NOTE — Progress Notes (Signed)
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  He was admitted on 04/05/2021 for reported suicidal ideation.  Objective: Patient is seen and examined.  Patient is a 45 year old male with the above-stated past psychiatric history who is seen in follow-up.  Yesterday we had to move him from [address] over to the [facility] because of his agitation and inappropriate behaviors.  Once he was placed on [address] some of his psychotic symptoms seem to resolve.  He requested reductions in the Seroquel.  We had increased that because he continued to complain of psychotic symptoms, but once upon [address] some of these tended to resolve.  He then stated that it was too much for him to take during the day.  He became agitated last evening due to the fact that the opiate detox protocol was stopped and the muscle relaxants were stopped.  He had been placed on the opiate detox protocol because he had had a recent prescription for buprenorphine.  He threw some liquids on the floor, and I discussed his behavior.  He stated that he would behave more appropriately, and that he had an interview on 6/19 with an [facility].  If you review the electronic medical record he had been recently hospitalized at [facility], then discharged, that he presented at the [facility], and presented there on 3 occasions and was sent away.  The police had to be called at 1 point to escort him from the building.  He does request to go back on doxepin and not trazodone.  He slept 8 hours last night.  One of his major concerns during the course hospitalization was his blood pressure and palpitations.  His blood pressure is 114/75, pulse was 76.  No new laboratories, but another one of his admission concerns was his  lipid panel, and from 6/15 it was all completely normal.  He had a repeat EKG done on 6/17 that was completely normal.  Principal Problem: Bipolar affective disorder (HCC) Diagnosis: Principal Problem:   Bipolar affective disorder (HCC) Active Problems:   MDD (major depressive disorder), single episode, severe with psychotic features (HCC)  Total Time spent with patient: 20 minutes  Past Psychiatric History: See admission H&P  Past Medical History:  Past Medical History:  Diagnosis Date   Bipolar disorder (HCC)    Cellulitis    Depression    Drug abuse (HCC)    Hepatitis C     Past Surgical History:  Procedure Laterality Date   BACK SURGERY     ROTATOR CUFF REPAIR     SHOULDER SURGERY     Family History:  Family History  Problem Relation Age of Onset   Alcohol abuse Father    Cancer Maternal Aunt    Family Psychiatric  History: See admission H&P Social History:  Social History   Substance and Sexual Activity  Alcohol Use Not Currently   Alcohol/week: 12.0 standard drinks   Types: 12 Cans of beer per week   Comment: heavy     Social History   Substance and Sexual Activity  Drug Use Not Currently   Types: Marijuana, Benzodiazepines, Other-see comments, Methamphetamines, Cocaine    Social History   Socioeconomic History   Marital status: Divorced    Spouse name: Not on file   Number of  children: Not on file   Years of education: Not on file   Highest education level: Not on file  Occupational History   Not on file  Tobacco Use   Smoking status: Every Day    Packs/day: 1.00    Years: 31.00    Pack years: 31.00    Types: Cigarettes    Start date: 10/22/1981   Smokeless tobacco: Never  Vaping Use   Vaping Use: Never used  Substance and Sexual Activity   Alcohol use: Not Currently    Alcohol/week: 12.0 standard drinks    Types: 12 Cans of beer per week    Comment: heavy   Drug use: Not Currently    Types: Marijuana, Benzodiazepines, Other-see  comments, Methamphetamines, Cocaine   Sexual activity: Not Currently  Other Topics Concern   Not on file  Social History Narrative   Not on file   Social Determinants of Health   Financial Resource Strain: Not on file  Food Insecurity: Not on file  Transportation Needs: Not on file  Physical Activity: Not on file  Stress: Not on file  Social Connections: Not on file   Additional Social History:                         Sleep: Good  Appetite:  Good  Current Medications: Current Facility-Administered Medications  Medication Dose Route Frequency Provider Last Rate Last Admin   acetaminophen (TYLENOL) tablet 650 mg  650 mg Oral Q6H PRN Novella Oliveolby, Karen R, NP   650 mg at 04/05/21 0928   alum & mag hydroxide-simeth (MAALOX/MYLANTA) 200-200-20 MG/5ML suspension 30 mL  30 mL Oral Q4H PRN Novella Oliveolby, Karen R, NP   30 mL at 04/07/21 1548   azithromycin (ZITHROMAX) tablet 250 mg  250 mg Oral Daily Antonieta Pertlary, Compton Brigance Lawson, MD   250 mg at 04/08/21 0807   doxepin (SINEQUAN) capsule 10 mg  10 mg Oral QHS Antonieta Pertlary, Daquisha Clermont Lawson, MD       feeding supplement (ENSURE ENLIVE / ENSURE PLUS) liquid 237 mL  237 mL Oral Q24H Antonieta Pertlary, Emonte Dieujuste Lawson, MD   237 mL at 04/06/21 1807   furosemide (LASIX) tablet 20 mg  20 mg Oral BID Antonieta Pertlary, Aerilynn Goin Lawson, MD   20 mg at 04/08/21 [id number]   gabapentin (NEURONTIN) capsule 800 mg  800 mg Oral TID Antonieta Pertlary, Kara Mierzejewski Lawson, MD   800 mg at 04/08/21 0806   hydrOXYzine (ATARAX/VISTARIL) tablet 25 mg  25 mg Oral Q6H PRN Antonieta Pertlary, Seung Nidiffer Lawson, MD   25 mg at 04/08/21 0601   metoprolol tartrate (LOPRESSOR) tablet 50 mg  50 mg Oral BID Antonieta Pertlary, Dima Mini Lawson, MD   50 mg at 04/08/21 [id number]   multivitamin with minerals tablet 1 tablet  1 tablet Oral Daily Antonieta Pertlary, Buffey Zabinski Lawson, MD   1 tablet at 04/08/21 0806   naproxen (NAPROSYN) tablet 500 mg  500 mg Oral BID PRN Antonieta Pertlary, Jonmichael Beadnell Lawson, MD   500 mg at 04/08/21 0601   nicotine (NICODERM CQ - dosed in mg/24 hours) patch 21 mg  21 mg Transdermal Daily Antonieta Pertlary, Burlin Mcnair  Lawson, MD   21 mg at 04/08/21 0806   OLANZapine zydis (ZYPREXA) disintegrating tablet 10 mg  10 mg Oral Q8H PRN Antonieta Pertlary, Ewin Rehberg Lawson, MD       And   ziprasidone (GEODON) injection 20 mg  20 mg Intramuscular Q12H PRN Antonieta Pertlary, Devona Holmes Lawson, MD       QUEtiapine (SEROQUEL) tablet 50 mg  50 mg Oral  QHS Antonieta Pert, MD       sertraline (ZOLOFT) tablet 100 mg  100 mg Oral Daily Antonieta Pert, MD   100 mg at 04/08/21 4259    Lab Results: No results found for this or any previous visit (from the past 48 hour(s)).  Blood Alcohol level:  Lab Results  Component Value Date   ETH <10 04/03/2021   ETH <10 01/24/2018    Metabolic Disorder Labs: Lab Results  Component Value Date   HGBA1C 5.7 (H) 04/05/2021   MPG 117 04/05/2021   MPG 116.89 06/18/2017   No results found for: PROLACTIN Lab Results  Component Value Date   CHOL 128 04/05/2021   TRIG 110 04/05/2021   HDL 42 04/05/2021   CHOLHDL 3.0 04/05/2021   VLDL 22 04/05/2021   LDLCALC 64 04/05/2021   LDLCALC 76 06/18/2017    Physical Findings: AIMS:  , ,  ,  ,    CIWA:  CIWA-Ar Total: 4 COWS:  COWS Total Score: 0  Musculoskeletal: Strength & Muscle Tone: within normal limits Gait & Station: normal Patient leans: N/A  Psychiatric Specialty Exam:  Presentation  General Appearance: Appropriate for Environment  Eye Contact:Fair  Speech:Normal Rate  Speech Volume:Normal  Handedness:Right   Mood and Affect  Mood:Irritable  Affect:Congruent   Thought Process  Thought Processes:Goal Directed  Descriptions of Associations:Circumstantial  Orientation:Full (Time, Place and Person)  Thought Content:Rumination  History of Schizophrenia/Schizoaffective disorder:No data recorded Duration of Psychotic Symptoms:No data recorded Hallucinations:Hallucinations: None Ideas of Reference:None  Suicidal Thoughts:Suicidal Thoughts: No Homicidal Thoughts:Homicidal Thoughts: No  Sensorium  Memory:Immediate Poor; Recent  Poor; Remote Poor  Judgment:Poor  Insight:Fair   Executive Functions  Concentration:Good  Attention Span:Good  Recall:Poor  Fund of Knowledge:Fair  Language:Good   Psychomotor Activity  Psychomotor Activity: Psychomotor Activity: Increased  Assets  Assets:Desire for Improvement; Resilience   Sleep  Sleep: Sleep: Good Number of Hours of Sleep: 8   Physical Exam: Physical Exam Vitals and nursing note reviewed.  HENT:     Head: Normocephalic and atraumatic.  Pulmonary:     Effort: Pulmonary effort is normal.  Neurological:     General: No focal deficit present.     Mental Status: He is alert and oriented to person, place, and time.   Review of Systems  All other systems reviewed and are negative. Blood pressure 114/75, pulse 76, temperature 97.9 F (36.6 C), temperature source Oral, resp. rate 16, SpO2 98 %. There is no height or weight on file to calculate BMI.   Treatment Plan Summary: Daily contact with patient to assess and evaluate symptoms and progress in treatment, Medication management, and Plan patient is seen and examined.  Patient is a 45 year old male with the above-stated past psychiatric history who is seen in follow-up.  Diagnosis: 1.  Substance-induced mood disorder. 2.  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 3.  Polysubstance dependence. 4.  History of opiate dependence. 5.  Cannabis use disorder 6.  Suspect hospitalization with elements of secondary gain.  Pertinent findings on examination today: 1.  Patient is redirectable with regard to some of his behaviors despite reduction in his antipsychotic medications.  Some of these behaviors are thought to be used for secondary gain. 2.  Patient stated he has an interview with an [facility] on 04/09/2021, and we will plan discharge after that. 3.  Patient request restarting doxepin, and reduction in medications.  Plan: 1.  Continue azithromycin 250 mg p.o. daily for 1 1 more  day. 2.  Stop opiate detox protocol. 3.  Start doxepin 10 mg p.o. nightly for insomnia. 4.  Stop trazodone. 5.  Continue Lasix 20 mg p.o. twice daily for hypertension. 6.  Recheck electrolytes given Lasix. 7.  Continue Neurontin 800 mg p.o. 3 times daily for chronic pain, anxiety and mood stability. 8.  Continue metoprolol short acting 50 mg p.o. twice daily for hypertension and tachycardia. 9.  Continue Naprosyn 500 mg p.o. twice daily as needed pain. 10.  Decrease Seroquel to 50 mg p.o. nightly for insomnia and mood stability. 11.  Continue Zyprexa Zydis agitation protocol as needed. 12.  Continue Zoloft 100 mg p.o. daily for anxiety and depression. 13.  Disposition planning-we will plan on discharge tomorrow.  Antonieta Pert, MD 04/08/2021, 11:08 AM

## 2021-04-08 NOTE — Progress Notes (Signed)
   04/08/21 0528  Vital Signs  Pulse Rate 76  BP 114/75  BP Location Right Arm  BP Method Automatic  Patient Position (if appropriate) Standing  Sleep  Number of Hours 8

## 2021-04-08 NOTE — Progress Notes (Signed)
Adult Psychoeducational Group Note  Date:  04/08/2021 Time:  8:26 PM  Group Topic/Focus:  Wrap-Up Group:   The focus of this group is to help patients review their daily goal of treatment and discuss progress on daily workbooks.  Participation Level:  Did Not Attend  Participation Quality:   Did Not Attend  Affect:  Did Not Attend  Cognitive:  Did Not Attend  Insight: None  Engagement in Group:  Did Not Attend  Modes of Intervention:  Did Not Attend  Additional Comments:  Pt did not attend evening wrap up group tonight.  Felipa Furnace 04/08/2021, 8:26 PM

## 2021-04-09 MED ORDER — QUETIAPINE FUMARATE 100 MG PO TABS: 100.0000 mg | ORAL_TABLET | Freq: Every day | ORAL | 0 refills | Status: DC

## 2021-04-09 MED ORDER — DOXEPIN HCL 25 MG PO CAPS
25.0000 mg | ORAL_CAPSULE | Freq: Every day | ORAL | Status: DC
  Filled 2021-04-09 (×2): qty 1

## 2021-04-09 MED ORDER — QUETIAPINE FUMARATE 100 MG PO TABS
100.0000 mg | ORAL_TABLET | Freq: Every day | ORAL | Status: DC
  Filled 2021-04-09 (×2): qty 1

## 2021-04-09 MED ORDER — METOPROLOL TARTRATE 50 MG PO TABS: 50.0000 mg | ORAL_TABLET | Freq: Two times a day (BID) | ORAL | 0 refills | Status: DC

## 2021-04-09 MED ORDER — SERTRALINE HCL 100 MG PO TABS: 100.0000 mg | ORAL_TABLET | Freq: Every day | ORAL | 0 refills | Status: DC

## 2021-04-09 MED ORDER — FUROSEMIDE 20 MG PO TABS: 20.0000 mg | ORAL_TABLET | Freq: Two times a day (BID) | ORAL | 0 refills | Status: DC

## 2021-04-09 MED ORDER — DOXEPIN HCL 25 MG PO CAPS: 25.0000 mg | ORAL_CAPSULE | Freq: Every day | ORAL | 0 refills | Status: DC

## 2021-04-09 MED ORDER — GABAPENTIN 400 MG PO CAPS: 800.0000 mg | ORAL_CAPSULE | Freq: Three times a day (TID) | ORAL | 0 refills | Status: DC

## 2021-04-09 NOTE — Plan of Care (Signed)
  Problem: Education: Goal: Ability to state activities that reduce stress will improve Outcome: Progressing   Problem: Self-Concept: Goal: Ability to modify response to factors that promote anxiety will improve Outcome: Progressing   Problem: Education: Goal: Knowledge of the prescribed therapeutic regimen will improve Outcome: Progressing

## 2021-04-09 NOTE — BHH Suicide Risk Assessment (Signed)
[facility] Discharge Suicide Risk Assessment   Principal Problem: Bipolar affective disorder [facility]) Discharge Diagnoses: Principal Problem:   Bipolar affective disorder (HCC) Active Problems:   MDD (major depressive disorder), single episode, severe with psychotic features (HCC)   Total Time spent with patient: 15 minutes  Musculoskeletal: Strength & Muscle Tone: within normal limits Gait & Station: normal Patient leans: N/A  Psychiatric Specialty Exam  Presentation  General Appearance: Fairly Groomed  Eye Contact:Fair  Speech:Normal Rate  Speech Volume:Normal  Handedness:Right   Mood and Affect  Mood:Dysphoric  Duration of Depression Symptoms: Less than two weeks  Affect:Congruent   Thought Process  Thought Processes:Goal Directed  Descriptions of Associations:Circumstantial  Orientation:Full (Time, Place and Person)  Thought Content:Logical  History of Schizophrenia/Schizoaffective disorder:No data recorded Duration of Psychotic Symptoms:No data recorded Hallucinations:Hallucinations: None  Ideas of Reference:None  Suicidal Thoughts:Suicidal Thoughts: No  Homicidal Thoughts:Homicidal Thoughts: No   Sensorium  Memory:Immediate Poor; Recent Poor; Remote Poor  Judgment:Poor  Insight:Lacking   Executive Functions  Concentration:Fair  Attention Span:Fair  Recall:Fair; Poor  Fund of Knowledge:Fair  Language:Fair   Psychomotor Activity  Psychomotor Activity:Psychomotor Activity: Normal   Assets  Assets:Desire for Improvement; Resilience   Sleep  Sleep:Sleep: Fair Number of Hours of Sleep: 8   Physical Exam: Physical Exam Vitals and nursing note reviewed.  HENT:     Head: Normocephalic and atraumatic.  Pulmonary:     Effort: Pulmonary effort is normal.  Neurological:     General: No focal deficit present.     Mental Status: He is alert and oriented to person, place, and time.   Review of Systems  All other systems reviewed and  are negative. Blood pressure (!) 127/102, pulse 76, temperature 98.9 F (37.2 C), temperature source Oral, resp. rate 16, SpO2 99 %. There is no height or weight on file to calculate BMI.  Mental Status Per Nursing Assessment::   On Admission:  Suicidal ideation indicated by patient  Demographic Factors:  Male, Caucasian, Low socioeconomic status, Living alone, and Unemployed  Loss Factors: Financial problems/change in socioeconomic status  Historical Factors: Impulsivity  Risk Reduction Factors:   NA  Continued Clinical Symptoms:  Bipolar Disorder:   Mixed State Alcohol/Substance Abuse/Dependencies  Cognitive Features That Contribute To Risk:  Thought constriction (tunnel vision)    Suicide Risk:  Minimal: No identifiable suicidal ideation.  Patients presenting with no risk factors but with morbid ruminations; may be classified as minimal risk based on the severity of the depressive symptoms   Follow-up Information     [facility] Follow up.   Specialty: Behavioral Health Why: Please go to this provider for therapy and medication management services.  Walk in hours are Monday through Wednesday, 8:00 am to 11:00 am.  Services are available first come, first served. Contact information: [phone] [place] [address] [phone]                Plan Of Care/Follow-up recommendations:  Activity:  ad lib  Antonieta Pert, MD 04/09/2021, 9:17 AM

## 2021-04-09 NOTE — Progress Notes (Signed)
   04/09/21 0206  Psych Admission Type (Psych Patients Only)  Admission Status Involuntary  Psychosocial Assessment  Patient Complaints Irritability  Eye Contact Brief;Darting  Facial Expression Animated;Anxious  Affect Labile  Speech Pressured;Argumentative  Interaction Avoidant  Motor Activity Restless  Appearance/Hygiene Disheveled;Poor hygiene  Behavior Characteristics Agressive verbally  Mood Anxious  Thought Process  Coherency WDL  Content Paranoia  Delusions None reported or observed  Perception Hallucinations  Hallucination Auditory (Talking to unseen others in hallway and room)  Judgment Poor  Confusion None  Danger to Self  Current suicidal ideation? Denies  Self-Injurious Behavior No self-injurious ideation or behavior indicators observed or expressed   Agreement Not to Harm Self Yes  Description of Agreement verbally contract to notify staff  Danger to Others  Danger to Others None reported or observed

## 2021-04-09 NOTE — Discharge Summary (Signed)
Physician Discharge Summary Note  Patient:  [name] [name] is an 45 y.o., male MRN:  [id number] DOB:  1976-06-14 Patient phone:  [phone] (home)  Patient address:   [address] [place] [address],   Total Time spent with patient:  Greater than 30 minutes.  Date of Admission:  04/04/2021  Date of Discharge: 04-09-21  Reason for Admission: Suicidal ideations & worsening psychosis.   Principal Problem: Bipolar affective disorder [facility])  Discharge Diagnoses: Patient Active Problem List   Diagnosis Date Noted   Polysubstance (including opioids) dependence with physiological dependence (HCC) [F19.20] 01/27/2013    Priority: High    Class: Acute   Alcohol withdrawal (HCC) [F10.239] 01/27/2013    Priority: High    Class: Acute   Bipolar affective disorder (HCC) [F31.9]     Priority: Medium   MDD (major depressive disorder), single episode, severe with psychotic features (HCC) [F32.3] 04/04/2021   Alcohol use disorder, severe, dependence (HCC) [F10.20]    Major depressive disorder, recurrent severe without psychotic features (HCC) [F33.2] 12/03/2016   ETOH abuse [F10.10]    Cirrhosis (HCC) [K74.60] 05/24/2015   Suicidal ideation [R45.851]    Recurrent cellulitis of lower leg [L03.119] 11/09/2014   Venous (peripheral) insufficiency [I87.2] 11/09/2014   Alcohol dependence with alcohol-induced mood disorder (HCC) [F10.24]    Opioid dependence with opioid-induced mood disorder (HCC) [F11.24]    MDD (major depressive disorder) [F32.9] 09/09/2014   Suicidal ideations [R45.851]    Chronic pain syndrome [G89.4] 05/31/2014   Tobacco dependence [F17.200] 05/30/2014   Poor dentition [K08.9] 02/03/2014   Hepatitis C virus infection without hepatic coma [B19.20] 02/03/2014   Sepsis (HCC) [A41.9] 01/29/2014   Anxiety state, unspecified [F41.1] 01/31/2013   Mood disorder in conditions classified elsewhere [F06.30] 01/31/2013   Past Psychiatric History: Opioid use disorder,    Past Medical History:  Past Medical History:  Diagnosis Date   Bipolar disorder (HCC)    Cellulitis    Depression    Drug abuse (HCC)    Hepatitis C     Past Surgical History:  Procedure Laterality Date   BACK SURGERY     ROTATOR CUFF REPAIR     SHOULDER SURGERY     Family History:  Family History  Problem Relation Age of Onset   Alcohol abuse Father    Cancer Maternal Aunt    Family Psychiatric  History: See H&P  Social History:  Social History   Substance and Sexual Activity  Alcohol Use Not Currently   Alcohol/week: 12.0 standard drinks   Types: 12 Cans of beer per week   Comment: heavy     Social History   Substance and Sexual Activity  Drug Use Not Currently   Types: Marijuana, Benzodiazepines, Other-see comments, Methamphetamines, Cocaine    Social History   Socioeconomic History   Marital status: Divorced    Spouse name: Not on file   Number of children: Not on file   Years of education: Not on file   Highest education level: Not on file  Occupational History   Not on file  Tobacco Use   Smoking status: Every Day    Packs/day: 1.00    Years: 31.00    Pack years: 31.00    Types: Cigarettes    Start date: 10/22/1981   Smokeless tobacco: Never  Vaping Use   Vaping Use: Never used  Substance and Sexual Activity   Alcohol use: Not Currently    Alcohol/week: 12.0 standard drinks    Types: 12  Cans of beer per week    Comment: heavy   Drug use: Not Currently    Types: Marijuana, Benzodiazepines, Other-see comments, Methamphetamines, Cocaine   Sexual activity: Not Currently  Other Topics Concern   Not on file  Social History Narrative   Not on file   Social Determinants of Health   Financial Resource Strain: Not on file  Food Insecurity: Not on file  Transportation Needs: Not on file  Physical Activity: Not on file  Stress: Not on file  Social Connections: Not on file   Hospital Course: (Per Md's admission evaluation lists): Patient is a  45 year old male with an unspecified past psychiatric history who presented on 04/03/2021 to the [facility] emergency department complaining of suicidal ideation as well as hallucinations.  He was noted to be continuously speaking to himself.  He stated he had a plan to hang himself.  The patient stated at that time that he had recurring thoughts of killing himself.  And he stated that he "have weapons in the woods, a gun and 2 bullets".  No other details were given.  He reported having had "a bunch" of suicide attempts with the most recent being a year ago with "[place] with my gun".  He reported auditory hallucinations but no specific details were given.  He also reported increased paranoia of "demons and God following me".  He was seen by the consult service, and review of the electronic medical record revealed that he had been recently admitted to the [facility] psychiatric unit on 03/18/2021.  His diagnosis at that time was major depression and substance abuse.  He was discharged, and then presented to the [facility] on 3 separate occasions diagnosed with suicidal ideation as well as malingering.  He had to be escorted off the property by police because of his refusal to leave.  He had been treated with azithromycin at [facility] for an unspecified upper respiratory tract infection.  He reported to the consult service that his main problem was homelessness and suicidal ideation.  Despite this information he had already been accepted to the [facility] behavioral health hospital, and it was felt that he could benefit from outpatient treatment.  It was unclear despite this information that he be admitted.  The patient stated today the reason why he wanted to be in the hospital was he needed his blood pressure checked, and he also was having palpitations.  He was admitted to the hospital for evaluation and stabilization.  Review of the electronic  medical record revealed that the patient had been picked up several times standing on bridges, and reporting to police that he was suicidal.  His discharge medications from [facility] included citalopram, hydroxyzine, mirtazapine, naloxone, azithromycin and Suboxone.  [facility].  Review of the PMP database revealed his last prescription for Suboxone was on 03/23/2021.  He was admitted to the hospital for evaluation and stabilization.  He received 8 strips of 8/2 mg films.  It also revealed that he had been prescribed Adderall for several months in 2021.  Prior to this discharge, Zacharee was seen & evaluated for mental health stability. The current laboratory findings were reviewed (stable), nurses notes & vital signs were reviewed as well. There are no current mental health or medical issues that should prevent this discharge at this time. Patient is being discharged to continue mental health care as noted below.  This is one of several psychiatric admissions/discharge summaries from this [facility] for this 45 year old male with hx of chronic mental illness , polysubstance use disorders & multiple psychiatric admissions. He is known in this [facility] & other psychiatric hospitals within the surrounding areas for worsening symptoms of Schizophrenia. He has been tried on multiple psychotropic medications for his symptoms & it appeared his symptoms has not been able to improve & yet Ryland is known to be non-compliant to his treatment regimen pre-disposing him to relapses/recurrent of symptoms & frequent hospitalizations. He was brought to the [facility] this time around for evaluation & treatment for suicidal ideations & worsening psychosis.     After evaluation of his presenting symptoms, Lem was recommended for mood stabilization treatments. The medication regimen for his presenting symptoms were discussed & with his consent initiated. He received, stabilized & was discharged on  the medications as listed below on his discharge medication lists. He was also enrolled & participated in the group counseling sessions being offered & held on this unit. He learned coping skills. He presented on this admission, other chronic medical conditions that required treatment & monitoring. He was treated & discharged on the medication used for those medical issues. He tolerated his treatment regimen without any adverse effects or reactions reported.  During the course of his hospitalization, the 15-minute checks were adequate to ensure Kaan's safety.  Patient did not display any dangerous, violent or suicidal behavior on the unit. He interacted with patients & staff appropriately. He participated appropriately in the group sessions/therapies. His medications were addressed & adjusted to meet his needs. He was recommended for outpatient follow-up care & medication management upon discharge to assure his continuity of care.  At the time of discharge, patient is not reporting any acute suicidal/homicidal ideations. He currently denies any new issues or concerns. Education and supportive counseling provided throughout her hospital stay & upon discharge.  Today upon his discharge evaluation with the attending psychiatrist, Jaysten presents mentally & medically stable. He denies any other specific concerns. He is sleeping well. His appetite is good. He denies other physical complaints. He denies AH/VH, delusional thoughts or paranoia. He feels that his medications have been helpful & is in agreement to continue his current treatment regimen as recommended. He was able to engage in safety planning including plan to return to [facility] or contact emergency services if he feels unable to maintain his own safety or the safety of others. Pt had no further questions, comments, or concerns. He left [facility] with all personal belongings in no apparent distress. Transportation per his friend.  Physical Findings: AIMS: Facial and  Oral Movements Muscles of Facial Expression: None, normal Lips and Perioral Area: None, normal Jaw: None, normal Tongue: None, normal,Extremity Movements Upper (arms, wrists, hands, fingers): None, normal Lower (legs, knees, ankles, toes): None, normal, Trunk Movements Neck, shoulders, hips: None, normal, Overall Severity Severity of abnormal movements (highest score from questions above): None, normal Incapacitation due to abnormal movements: None, normal Patient's awareness of abnormal movements (rate only patient's report): No Awareness, Dental Status Current problems with teeth and/or dentures?: No Does patient usually wear dentures?: No  CIWA:  CIWA-Ar Total: 4 COWS:  COWS Total Score: 0  Musculoskeletal: Strength & Muscle Tone: within normal limits Gait & Station: normal Patient leans: N/A  Psychiatric Specialty Exam: Physical Exam Vitals and nursing note reviewed.  Constitutional:      Appearance: He is well-developed.  HENT:     Head: Normocephalic.  Eyes:  Pupils: Pupils are equal, round, and reactive to light.  Cardiovascular:     Rate and Rhythm: Normal rate.  Pulmonary:     Effort: Pulmonary effort is normal.  Abdominal:     Palpations: Abdomen is soft.  Genitourinary:    Comments: Deferred Musculoskeletal:        General: Normal range of motion.     Cervical back: Normal range of motion.  Skin:    General: Skin is warm and dry.  Neurological:     Mental Status: He is alert and oriented to person, place, and time.    Review of Systems  Constitutional: Negative.   HENT: Negative.    Eyes: Negative.   Respiratory: Negative.    Cardiovascular: Negative.   Gastrointestinal: Negative.   Genitourinary: Negative.   Musculoskeletal: Negative.   Skin: Negative.   Neurological: Negative.   Endo/Heme/Allergies: Negative.   Psychiatric/Behavioral:  Positive for depression (Stable), hallucinations (Hx. Psychosis) and substance abuse (Hx. Polysubstance use  disorder). Negative for memory loss and suicidal ideas. The patient has insomnia (Stable). The patient is not nervous/anxious.    Blood pressure (!) 127/102, pulse 76, temperature 98.9 F (37.2 C), temperature source Oral, resp. rate 16, SpO2 99 %.There is no height or weight on file to calculate BMI.  See Md's SRA   Has this patient used any form of tobacco in the last 30 days? (Cigarettes, Smokeless Tobacco, Cigars, and/or Pipes): Yes, provided with nicorette gum prescription.  Blood Alcohol level:  Lab Results  Component Value Date   ETH <10 04/03/2021   ETH <10 01/24/2018   Metabolic Disorder Labs:  Lab Results  Component Value Date   HGBA1C 5.7 (H) 04/05/2021   MPG 117 04/05/2021   MPG 116.89 06/18/2017   No results found for: PROLACTIN Lab Results  Component Value Date   CHOL 128 04/05/2021   TRIG 110 04/05/2021   HDL 42 04/05/2021   CHOLHDL 3.0 04/05/2021   VLDL 22 04/05/2021   LDLCALC 64 04/05/2021   LDLCALC 76 06/18/2017   See Psychiatric Specialty Exam and Suicide Risk Assessment completed by Attending Physician prior to discharge.  Discharge destination:  Home  Is patient on multiple antipsychotic therapies at discharge:  No   Has Patient had three or more failed trials of antipsychotic monotherapy by history:  No  Recommended Plan for Multiple Antipsychotic Therapies: NA Discharge Instructions     Diet - low sodium heart healthy   Complete by: As directed    Increase activity slowly   Complete by: As directed       Allergies as of 04/09/2021       Reactions   Lithium Anaphylaxis   Penicillins Rash   Has patient had a PCN reaction causing immediate rash, facial/tongue/throat swelling, SOB or lightheadedness with hypotension: Yes Has patient had a PCN reaction causing severe rash involving mucus membranes or skin necrosis: No Has patient had a PCN reaction that required hospitalization: No Has patient had a PCN reaction occurring within the last 10  years: No If all of the above answers are "NO", then may proceed with Cephalosporin use.        Medication List     TAKE these medications      Indication  doxepin 25 MG capsule Commonly known as: SINEQUAN Take 1 capsule (25 mg total) by mouth at bedtime.  Indication: Depressive Phase of Manic-Depression   furosemide 20 MG tablet Commonly known as: LASIX Take 1 tablet (20 mg total) by mouth 2 (  two) times daily.  Indication: High Blood Pressure Disorder   gabapentin 400 MG capsule Commonly known as: NEURONTIN Take 2 capsules (800 mg total) by mouth 3 (three) times daily.  Indication: Abuse or Misuse of Alcohol   metoprolol tartrate 50 MG tablet Commonly known as: LOPRESSOR Take 1 tablet (50 mg total) by mouth 2 (two) times daily.  Indication: High Blood Pressure Disorder   QUEtiapine 100 MG tablet Commonly known as: SEROQUEL Take 1 tablet (100 mg total) by mouth at bedtime.  Indication: Depressive Phase of Manic-Depression, Mood control   sertraline 100 MG tablet Commonly known as: ZOLOFT Take 1 tablet (100 mg total) by mouth daily. Start taking on: April 10, 2021  Indication: Major Depressive Disorder        Follow-up Information     [facility].   Specialty: Behavioral Health Why: Please go to this provider for therapy and medication management services.  Walk in hours are Monday through Wednesday, 8:00 am to 11:00 am.  Services are available first come, first served. Contact information: [address] [place] [id number] [phone]               Follow-up recommendations: Activity:  As tolerated Diet: As recommended by your primary care doctor. Keep all scheduled follow-up appointments as recommended.   Comments: Patient is instructed prior to discharge to: Take all medications as prescribed by his/her mental healthcare provider. Report any adverse effects and or reactions from the medicines to  his/her outpatient provider promptly. Patient has been instructed & cautioned: To not engage in alcohol and or illegal drug use while on prescription medicines. In the event of worsening symptoms, patient is instructed to call the crisis hotline, 911 and or go to the nearest ED for appropriate evaluation and treatment of symptoms. To follow-up with his/her primary care provider for your other medical issues, concerns and or health care needs.   Signed: Armandina StammerAgnes Micky Overturf, NP, pmhnp, fnp-bc 04/09/2021, 10:11 AM

## 2021-04-09 NOTE — [facility] Group Notes (Signed)
[facility] LCSW Group Therapy Note  Date/Time:  04/09/2021 11:00-12:00 PM  Type of Therapy and Topic:  Group Therapy:  Healthy and Unhealthy Supports  Participation Level:  Did Not Attend   Description of Group:  Patients in this group were introduced to the idea of adding a variety of healthy supports to address the various needs in their lives.Patients discussed what additional healthy supports could be helpful in their recovery and wellness after discharge in order to prevent future hospitalizations.   An emphasis was placed on using counselor, doctor, therapy groups, 12-step groups, and problem-specific support groups to expand supports.  They also worked as a group on developing a specific plan for several patients to deal with unhealthy supports through boundary-setting, psychoeducation with loved ones, and even termination of relationships.   Therapeutic Goals:   1)  discuss importance of adding supports to stay well once out of the hospital  2)  compare healthy versus unhealthy supports and identify some examples of each  3)  generate ideas and descriptions of healthy supports that can be added  4)  offer mutual support about how to address unhealthy supports  5)  encourage active participation in and adherence to discharge plan    Summary of Patient Progress:  The patient did not attend.   Therapeutic Modalities:   Motivational Interviewing Brief Solution-Focused Therapy  Jerriah Ines D Tracee Mccreery        

## 2021-04-09 NOTE — Progress Notes (Signed)
  [facility] Adult Case Management Discharge Plan :  Will you be returning to the same living situation after discharge:  No.  Has an interview with an [employer] this afternoon At discharge, do you have transportation home?: Yes,  a friend is picking him up Do you have the ability to pay for your medications: Yes,  insurance  Release of information consent forms completed and emailed to Medical Records, then turned in to Medical Records by CSW.   Patient to Follow up at:  Follow-up Information     [facility] Follow up.   Specialty: Behavioral Health Why: Please go to this provider for therapy and medication management services.  Walk in hours are Monday through Wednesday, 8:00 am to 11:00 am.  Services are available first come, first served. Contact information: [address] [place] [address] [phone]                Next level of care provider has access to [facility] Link:yes  Safety Planning and Suicide Prevention discussed: No.  Declined     Has patient been referred to the Quitline?: Patient refused referral  Patient has been referred for addiction treatment: Yes  Lynnell Chad, LCSW 04/09/2021, 9:24 AM

## 2021-04-09 NOTE — Plan of Care (Signed)
Discharge note  Patient verbalizes readiness for discharge. Follow up plan explained, AVS, Transition record and SRA given. Prescriptions and teaching provided. Belongings returned and signed for. Suicide safety plan completed and signed. Patient verbalizes understanding. Patient denies SI/HI and assures this Clinical research associate they will seek assistance should that change. Patient discharged to lobby where friend was waiting.  Problem: Education: Goal: Ability to state activities that reduce stress will improve 04/09/2021 1408 by Raylene Miyamoto, RN Outcome: Adequate for Discharge 04/09/2021 0825 by Raylene Miyamoto, RN Outcome: Progressing   Problem: Coping: Goal: Ability to identify and develop effective coping behavior will improve Outcome: Adequate for Discharge   Problem: Self-Concept: Goal: Ability to identify factors that promote anxiety will improve Outcome: Adequate for Discharge Goal: Level of anxiety will decrease Outcome: Adequate for Discharge Goal: Ability to modify response to factors that promote anxiety will improve 04/09/2021 1408 by Raylene Miyamoto, RN Outcome: Adequate for Discharge 04/09/2021 0825 by Raylene Miyamoto, RN Outcome: Progressing   Problem: Education: Goal: Utilization of techniques to improve thought processes will improve Outcome: Adequate for Discharge Goal: Knowledge of the prescribed therapeutic regimen will improve 04/09/2021 1408 by Raylene Miyamoto, RN Outcome: Adequate for Discharge 04/09/2021 0825 by Raylene Miyamoto, RN Outcome: Progressing   Problem: Activity: Goal: Interest or engagement in leisure activities will improve Outcome: Adequate for Discharge Goal: Imbalance in normal sleep/wake cycle will improve Outcome: Adequate for Discharge   Problem: Coping: Goal: Coping ability will improve Outcome: Adequate for Discharge Goal: Will verbalize feelings Outcome: Adequate for Discharge   Problem: Health Behavior/Discharge  Planning: Goal: Ability to make decisions will improve Outcome: Adequate for Discharge Goal: Compliance with therapeutic regimen will improve Outcome: Adequate for Discharge   Problem: Role Relationship: Goal: Will demonstrate positive changes in social behaviors and relationships Outcome: Adequate for Discharge   Problem: Safety: Goal: Ability to disclose and discuss suicidal ideas will improve Outcome: Adequate for Discharge Goal: Ability to identify and utilize support systems that promote safety will improve Outcome: Adequate for Discharge   Problem: Self-Concept: Goal: Will verbalize positive feelings about self Outcome: Adequate for Discharge Goal: Level of anxiety will decrease Outcome: Adequate for Discharge   Problem: Education: Goal: Ability to make informed decisions regarding treatment will improve Outcome: Adequate for Discharge   Problem: Coping: Goal: Coping ability will improve Outcome: Adequate for Discharge   Problem: Health Behavior/Discharge Planning: Goal: Identification of resources available to assist in meeting health care needs will improve Outcome: Adequate for Discharge   Problem: Medication: Goal: Compliance with prescribed medication regimen will improve Outcome: Adequate for Discharge   Problem: Self-Concept: Goal: Ability to disclose and discuss suicidal ideas will improve Outcome: Adequate for Discharge Goal: Will verbalize positive feelings about self Outcome: Adequate for Discharge   Problem: Education: Goal: Knowledge of disease or condition will improve Outcome: Adequate for Discharge Goal: Understanding of discharge needs will improve Outcome: Adequate for Discharge   Problem: Health Behavior/Discharge Planning: Goal: Ability to identify changes in lifestyle to reduce recurrence of condition will improve Outcome: Adequate for Discharge Goal: Identification of resources available to assist in meeting health care needs will  improve Outcome: Adequate for Discharge   Problem: Physical Regulation: Goal: Complications related to the disease process, condition or treatment will be avoided or minimized Outcome: Adequate for Discharge   Problem: Safety: Goal: Ability to remain free from injury will improve Outcome: Adequate for Discharge   Problem: Education: Goal: Knowledge of [facility] information/materials will improve  Outcome: Adequate for Discharge Goal: Emotional status will improve Outcome: Adequate for Discharge Goal: Mental status will improve Outcome: Adequate for Discharge Goal: Verbalization of understanding the information provided will improve Outcome: Adequate for Discharge   Problem: Activity: Goal: Interest or engagement in activities will improve Outcome: Adequate for Discharge Goal: Sleeping patterns will improve Outcome: Adequate for Discharge   Problem: Coping: Goal: Ability to verbalize frustrations and anger appropriately will improve Outcome: Adequate for Discharge Goal: Ability to demonstrate self-control will improve Outcome: Adequate for Discharge   Problem: Health Behavior/Discharge Planning: Goal: Identification of resources available to assist in meeting health care needs will improve Outcome: Adequate for Discharge Goal: Compliance with treatment plan for underlying cause of condition will improve Outcome: Adequate for Discharge   Problem: Physical Regulation: Goal: Ability to maintain clinical measurements within normal limits will improve Outcome: Adequate for Discharge   Problem: Safety: Goal: Periods of time without injury will increase Outcome: Adequate for Discharge

## 2021-04-16 ENCOUNTER — Emergency Department (HOSPITAL_COMMUNITY)
Admission: EM | Admit: 2021-04-16 | Discharge: 2021-04-16 | Disposition: A | Discharge status: Home / Self Care | Payer: Medicare HMO | Attending: Emergency Medicine | Admitting: Emergency Medicine

## 2021-04-16 ENCOUNTER — Other Ambulatory Visit: Payer: Self-pay

## 2021-04-16 ENCOUNTER — Encounter (HOSPITAL_COMMUNITY): Payer: Self-pay

## 2021-04-16 DIAGNOSIS — F17210 Nicotine dependence, cigarettes, uncomplicated: Secondary | ICD-10-CM | POA: Diagnosis not present

## 2021-04-16 DIAGNOSIS — Z760 Encounter for issue of repeat prescription: Secondary | ICD-10-CM | POA: Diagnosis present

## 2021-04-16 MED ORDER — QUETIAPINE FUMARATE 100 MG PO TABS
100.0000 mg | ORAL_TABLET | Freq: Every day | ORAL | Status: DC
  Administered 2021-04-16: 100 mg via ORAL
  Filled 2021-04-16: qty 1

## 2021-04-16 MED ORDER — METOPROLOL TARTRATE 25 MG PO TABS
50.0000 mg | ORAL_TABLET | Freq: Once | ORAL | Status: AC
  Administered 2021-04-16: 50 mg via ORAL
  Filled 2021-04-16: qty 2

## 2021-04-16 MED ORDER — SERTRALINE HCL 50 MG PO TABS
100.0000 mg | ORAL_TABLET | Freq: Every day | ORAL | Status: DC
  Administered 2021-04-16: 100 mg via ORAL
  Filled 2021-04-16: qty 2

## 2021-04-16 NOTE — Discharge Instructions (Addendum)
Please follow-up with your doctor tomorrow for medication refill as it is important for your doctor to reassess you and determine if medications that you are taking is of appropriate dosage.

## 2021-04-16 NOTE — ED Triage Notes (Addendum)
Patient states he needs some of his meds refilled- trazadone, Neurontin, zoloft, seroquel, and metoprolol. Patient states he has been out of his meds x 2 days.  BP in triage- 167/121

## 2021-04-16 NOTE — ED Notes (Signed)
ED Provider at bedside. 

## 2021-04-16 NOTE — ED Notes (Signed)
Pt requested evening meds to be provided now, RN complied.  D/c paperwork reviewed with pt, including f/u care.  No questions or concerns at time of d/c. Ambulatory to ED exit.

## 2021-04-16 NOTE — ED Provider Notes (Signed)
[facility]EMERGENCY DEPT Provider Note   CSN: [id number] Arrival date & time: 04/16/21  1014     History Chief Complaint  Patient presents with   Medication Refill    [name] [name] is a 45 y.o. male.  The history is provided by the patient and medical records. No language interpreter was used.  Medication Refill  45 year old male significant history of bipolar, depression, polysubstance abuse, hepatitis C, who presents requesting for medication refill.  Patient report he [facility] from [facility] and was prescribed a short course of his home medication which he ran out 2 days ago.  He did reach out to his PCP for follow-up but states his next appointment is not until 3 weeks from now and he is here requesting for medication refill.  He does not complain of any specific symptoms no report of any SI or HI.  No other complaint.  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   ETOH abuse    Cirrhosis (HCC) 05/24/2015   Suicidal ideation    Recurrent cellulitis of lower leg 11/09/2014   Venous (peripheral) insufficiency 11/09/2014   Bipolar affective disorder (HCC)    Alcohol dependence with alcohol-induced mood disorder (HCC)    Opioid dependence with opioid-induced mood disorder (HCC)    MDD (major depressive disorder) 09/09/2014   Suicidal ideations    Chronic pain syndrome 05/31/2014   Tobacco dependence 05/30/2014   Poor dentition 02/03/2014   Hepatitis C  virus infection without hepatic coma 02/03/2014   Sepsis (HCC) 01/29/2014   Anxiety state, unspecified 01/31/2013   Mood disorder in conditions classified elsewhere 01/31/2013   Polysubstance (including opioids) dependence with physiological dependence (HCC) 01/27/2013    Class: Acute   Alcohol withdrawal (HCC) 01/27/2013    Class: Acute    Past Surgical History:  Procedure Laterality Date   BACK SURGERY     ROTATOR CUFF REPAIR     SHOULDER SURGERY         Family History  Problem Relation Age of Onset   Alcohol abuse Father    Cancer Maternal Aunt     Social History   Tobacco Use   Smoking status: Every Day    Packs/day: 1.00    Years: 31.00    Pack years: 31.00    Types: Cigarettes    Start date: 10/22/1981   Smokeless tobacco: Never  Vaping Use   Vaping Use: Never used  Substance Use Topics   Alcohol use: Not Currently    Alcohol/week: 12.0 standard drinks    Types: 12 Cans of beer per week    Comment: heavy   Drug use: Not Currently    Types: Marijuana, Benzodiazepines, Other-see comments, Methamphetamines, Cocaine    Home Medications Prior to Admission medications   Medication Sig Start Date End Date Taking? Authorizing Provider  doxepin (SINEQUAN) 25 MG capsule Take 1 capsule (25 mg total) by mouth at bedtime. 04/09/21   Antonieta Pert, MD  furosemide (LASIX) 20  MG tablet Take 1 tablet (20 mg total) by mouth 2 (two) times daily. 04/09/21   Antonieta Pert, MD  gabapentin (NEURONTIN) 400 MG capsule Take 2 capsules (800 mg total) by mouth 3 (three) times daily. 04/09/21   Antonieta Pert, MD  metoprolol tartrate (LOPRESSOR) 50 MG tablet Take 1 tablet (50 mg total) by mouth 2 (two) times daily. 04/09/21   Antonieta Pert, MD  QUEtiapine (SEROQUEL) 100 MG tablet Take 1 tablet (100 mg total) by mouth at bedtime. 04/09/21   Antonieta Pert, MD  sertraline (ZOLOFT) 100 MG tablet Take 1 tablet (100 mg total) by mouth daily. 04/10/21   Antonieta Pert, MD     Allergies    Lithium and Penicillins  Review of Systems   Review of Systems  All other systems reviewed and are negative.  Physical Exam Updated Vital Signs BP (!) 167/121 (BP Location: Left Arm)   Pulse (!) 107   Temp 98 F (36.7 C) (Oral)   Resp 18   Ht 5\' 9"  (1.753 m)   Wt 74.8 kg   SpO2 100%   BMI 24.37 kg/m   Physical Exam Vitals and nursing note reviewed.  Constitutional:      General: He is not in acute distress.    Appearance: He is well-developed.  HENT:     Head: Atraumatic.  Eyes:     Conjunctiva/sclera: Conjunctivae normal.  Abdominal:     Palpations: Abdomen is soft.  Musculoskeletal:     Cervical back: Neck supple.  Skin:    Findings: No rash.  Neurological:     Mental Status: He is alert. Mental status is at baseline.    ED Results / Procedures / Treatments   Labs (all labs ordered are listed, but only abnormal results are displayed) Labs Reviewed - No data to display  EKG None  Radiology No results found.  Procedures Procedures   Medications Ordered in ED Medications - No data to display  ED Course  I have reviewed the triage vital signs and the nursing notes.  Pertinent labs & imaging results that were available during my care of the patient were reviewed by me and considered in my medical decision making (see chart for details).    MDM Rules/Calculators/A&P                          BP (!) 167/121 (BP Location: Left Arm)   Pulse (!) 107   Temp 98 F (36.7 C) (Oral)   Resp 18   Ht 5\' 9"  (1.753 m)   Wt 74.8 kg   SpO2 100%   BMI 24.37 kg/m   Final Clinical Impression(s) / ED Diagnoses Final diagnoses:  Encounter for medication refill    Rx / DC Orders ED Discharge Orders     None      11:15 AM Patient report he ran out of his medications 2 days ago and is unable to follow-up with PCP for at least 3 weeks.  He request for refill of trazodone, Neurontin, Zoloft, Seroquel, and metoprolol.  He does not have any  other symptoms.  He is a bit hypertensive with blood pressure of 167/121.  He is overall not in any acute discomfort.  I felt it would be reasonable to provide patient with his home medication today but encourage patient to follow-up in his PCPs office tomorrow for medication refill.  Patient voiced understanding and agrees with plan   ,  Kelton Pillar 04/16/21 1117    Lorre Nick, MD 04/16/21 [phone]

## 2021-04-17 ENCOUNTER — Telehealth: Payer: Self-pay | Admitting: Nurse Practitioner

## 2021-04-17 NOTE — Telephone Encounter (Signed)
VM was left FU from ED Visit 

## 2021-04-23 ENCOUNTER — Emergency Department (HOSPITAL_COMMUNITY)
Admission: EM | Admit: 2021-04-23 | Discharge: 2021-04-24 | Disposition: A | Discharge status: Home / Self Care | Payer: Medicare HMO | Attending: Emergency Medicine | Admitting: Emergency Medicine

## 2021-04-23 DIAGNOSIS — R440 Auditory hallucinations: Secondary | ICD-10-CM | POA: Diagnosis not present

## 2021-04-23 DIAGNOSIS — R45850 Homicidal ideations: Secondary | ICD-10-CM | POA: Insufficient documentation

## 2021-04-23 DIAGNOSIS — F1024 Alcohol dependence with alcohol-induced mood disorder: Secondary | ICD-10-CM | POA: Diagnosis not present

## 2021-04-23 DIAGNOSIS — F1920 Other psychoactive substance dependence, uncomplicated: Secondary | ICD-10-CM | POA: Diagnosis present

## 2021-04-23 DIAGNOSIS — F332 Major depressive disorder, recurrent severe without psychotic features: Secondary | ICD-10-CM | POA: Diagnosis present

## 2021-04-23 DIAGNOSIS — R45851 Suicidal ideations: Secondary | ICD-10-CM | POA: Diagnosis not present

## 2021-04-23 DIAGNOSIS — Z20822 Contact with and (suspected) exposure to covid-19: Secondary | ICD-10-CM | POA: Insufficient documentation

## 2021-04-23 DIAGNOSIS — F319 Bipolar disorder, unspecified: Secondary | ICD-10-CM | POA: Diagnosis not present

## 2021-04-23 DIAGNOSIS — F17210 Nicotine dependence, cigarettes, uncomplicated: Secondary | ICD-10-CM | POA: Insufficient documentation

## 2021-04-23 DIAGNOSIS — Z765 Malingerer [conscious simulation]: Secondary | ICD-10-CM

## 2021-04-23 LAB — ETHANOL: Alcohol, Ethyl (B): <10 mg/dL (ref <10)

## 2021-04-23 LAB — CBC
HCT: 44.8 % (ref 39.0–52.0)
Hemoglobin: 14.7 g/dL (ref 13.0–17.0)
MCH: 31.3 pg (ref 26.0–34.0)
MCHC: 32.8 g/dL (ref 30.0–36.0)
MCV: 95.3 fL (ref 80.0–100.0)
Platelets: 287 10*3/uL (ref 150–400)
RBC: 4.7 MIL/uL (ref 4.22–5.81)
RDW: 13.4 % (ref 11.5–15.5)
WBC: 14.7 10*3/uL — ABNORMAL HIGH (ref 4.0–10.5)
nRBC: 0 % (ref 0.0–0.2)

## 2021-04-23 LAB — SALICYLATE LEVEL: Salicylate Lvl: <7 mg/dL — ABNORMAL LOW (ref 7.0–30.0)

## 2021-04-23 LAB — COMPREHENSIVE METABOLIC PANEL
ALT: 29 U/L (ref 0–44)
AST: 30 U/L (ref 15–41)
Albumin: 4 g/dL (ref 3.5–5.0)
Alkaline Phosphatase: 65 U/L (ref 38–126)
Anion gap: 10 (ref 5–15)
BUN: 18 mg/dL (ref 6–20)
CO2: 24 mmol/L (ref 22–32)
Calcium: 9.4 mg/dL (ref 8.9–10.3)
Chloride: 106 mmol/L (ref 98–111)
Creatinine, Ser: 0.73 mg/dL (ref 0.61–1.24)
GFR, Estimated: >60 mL/min (ref >60)
Glucose, Bld: 94 mg/dL (ref 70–99)
Potassium: 3.8 mmol/L (ref 3.5–5.1)
Sodium: 140 mmol/L (ref 135–145)
Total Bilirubin: 0.9 mg/dL (ref 0.3–1.2)
Total Protein: 6.9 g/dL (ref 6.5–8.1)

## 2021-04-23 LAB — ACETAMINOPHEN LEVEL: Acetaminophen (Tylenol), Serum: <10 ug/mL — ABNORMAL LOW (ref 10–30)

## 2021-04-23 MED ORDER — THIAMINE HCL 100 MG/ML IJ SOLN: 100.0000 mg | Freq: Every day | INTRAMUSCULAR | Status: DC

## 2021-04-23 MED ORDER — LORAZEPAM 2 MG/ML IJ SOLN
0.0000 mg | Freq: Two times a day (BID) | INTRAMUSCULAR | Status: DC
Start: 2021-04-26 — End: 2021-04-25

## 2021-04-23 MED ORDER — LORAZEPAM 1 MG PO TABS: 0.0000 mg | ORAL_TABLET | Freq: Two times a day (BID) | ORAL | Status: DC

## 2021-04-23 MED ORDER — THIAMINE HCL 100 MG PO TABS: 100.0000 mg | ORAL_TABLET | Freq: Every day | ORAL | Status: DC

## 2021-04-23 MED ORDER — LORAZEPAM 2 MG/ML IJ SOLN: 0.0000 mg | Freq: Four times a day (QID) | INTRAMUSCULAR | Status: DC

## 2021-04-23 MED ORDER — LORAZEPAM 1 MG PO TABS
0.0000 mg | ORAL_TABLET | Freq: Four times a day (QID) | ORAL | Status: DC
Start: 2021-04-23 — End: 2021-04-25

## 2021-04-23 NOTE — BH Assessment (Signed)
Per RN C. Chrisco, pt is not in a private place where TTS assessment can be completed currently. Per RN, pt cannot be moved at the tmoment. She stated she will notify TTS when pt can be moved into a private place to complete TTS assessment.

## 2021-04-23 NOTE — ED Notes (Signed)
The pt is talking to himself

## 2021-04-23 NOTE — ED Notes (Signed)
The pt   Report that he has been hearing voioces for 2-3 weeks he was on med from behavorial and it worked for [employer] then did not work any more  Very  Poor hygiene

## 2021-04-23 NOTE — ED Provider Notes (Signed)
Emergency Department Provider Note   I have reviewed the triage vital signs and the nursing notes.   HISTORY  Chief Complaint No chief complaint on file.   HPI [name] [name] is a 45 y.o. male with past medical history of bipolar and drug abuse presents to the emergency department with thoughts of harming himself and others.  He states he has had auditory hallucinations over the past several weeks which seem to be worsening.  He states he is upset with his wife and has had increasingly intrusive thoughts of harming himself.  He denies drug or alcohol use.  He is unable to give me specifics regarding the auditory hallucinations.  No visual hallucinations.  Patient denies taking any psychiatric medicines.  He has no medical complaints at this time.  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   ETOH abuse    Cirrhosis (HCC) 05/24/2015   Suicidal ideation    Recurrent cellulitis of lower leg 11/09/2014   Venous (peripheral) insufficiency 11/09/2014   Bipolar affective disorder (HCC)    Alcohol dependence with alcohol-induced mood disorder (HCC)    Opioid dependence with opioid-induced mood disorder (HCC)    MDD (major depressive disorder) 09/09/2014   Suicidal ideations    Chronic pain syndrome 05/31/2014   Tobacco dependence 05/30/2014   Poor dentition 02/03/2014   Hepatitis C virus infection without hepatic coma 02/03/2014   Sepsis (HCC) 01/29/2014   Anxiety state, unspecified 01/31/2013   Mood disorder in conditions classified elsewhere 01/31/2013   Polysubstance (including opioids) dependence with  physiological dependence (HCC) 01/27/2013    Class: Acute   Alcohol withdrawal (HCC) 01/27/2013    Class: Acute    Past Surgical History:  Procedure Laterality Date   BACK SURGERY     ROTATOR CUFF REPAIR     SHOULDER SURGERY      Allergies Lithium and Penicillins  Family History  Problem Relation Age of Onset   Alcohol abuse Father    Cancer Maternal Aunt     Social History Social History   Tobacco Use   Smoking status: Every Day    Packs/day: 1.00    Years: 31.00    Pack years: 31.00    Types: Cigarettes    Start date: 10/22/1981   Smokeless tobacco: Never  Vaping Use   Vaping Use: Never used  Substance Use Topics   Alcohol use: Not Currently    Alcohol/week: 12.0 standard drinks    Types: 12 Cans of beer per week    Comment: heavy   Drug use: Not Currently    Types: Marijuana, Benzodiazepines, Other-see comments, Methamphetamines, Cocaine    Review of Systems  Constitutional: No fever/chills Eyes: No visual changes. ENT: No sore throat. Cardiovascular: Denies chest pain. Respiratory: Denies shortness of breath. Gastrointestinal: No abdominal pain.  No nausea, no vomiting.  No diarrhea.  No constipation. Genitourinary: Negative for dysuria. Musculoskeletal: Negative for back pain. Skin: Negative for rash. Neurological: Negative for headaches, focal weakness or numbness. Psychiatric: Positive SI and HI with AH.   10-point ROS otherwise negative.  ____________________________________________  PHYSICAL EXAM:  VITAL SIGNS: ED Triage Vitals  Enc Vitals Group     BP 04/23/21 2037 (!) 149/86     Pulse Rate 04/23/21 2037 96     Resp 04/23/21 2037 20     Temp 04/23/21 2037 99 F (37.2 C)     Temp src --      SpO2 04/23/21 2037 98 %   Constitutional: Alert and oriented. Well appearing and in no acute distress. Eyes: Conjunctivae are normal.  Head: Atraumatic. Nose: No congestion/rhinnorhea. Mouth/Throat: Mucous membranes are moist.   Neck: No  stridor.   Cardiovascular: Normal rate, regular rhythm. Good peripheral circulation. Grossly normal heart sounds.   Respiratory: Normal respiratory effort.  No retractions. Lungs CTAB. Gastrointestinal: Soft and nontender. No distention.  Musculoskeletal: No gross deformities of extremities. Neurologic:  Normal speech and language. Skin:  Skin is warm, dry and intact. No rash noted. Psych: Positive SI and HI. Muted responses and flat affect.    ____________________________________________   LABS (all labs ordered are listed, but only abnormal results are displayed)  Labs Reviewed  SALICYLATE LEVEL - Abnormal; Notable for the following components:      Result Value   Salicylate Lvl <7.0 (*)    All other components within normal limits  ACETAMINOPHEN LEVEL - Abnormal; Notable for the following components:   Acetaminophen (Tylenol), Serum <10 (*)    All other components within normal limits  CBC - Abnormal; Notable for the following components:   WBC 14.7 (*)    All other components within normal limits  RESP PANEL BY RT-PCR (FLU A&B, COVID) ARPGX2  COMPREHENSIVE METABOLIC PANEL  ETHANOL  RAPID URINE DRUG SCREEN, HOSP PERFORMED   ____________________________________________  RADIOLOGY  None   ____________________________________________   PROCEDURES  Procedure(s) performed:   Procedures  None  ____________________________________________   INITIAL IMPRESSION / ASSESSMENT AND PLAN / ED COURSE  Pertinent labs & imaging results that were available during my care of the patient were reviewed by me and considered in my medical decision making (see chart for details).   Patient presents to the emergency department with thoughts of suicide and homicide.  He reports increasing auditory hallucinations.  His chart review shows history of alcohol and drug use.  He denies this to me currently.  Denies being on any medications.   Labs reviewed. Patient is medically clear for  TTS evaluation.  ____________________________________________  FINAL CLINICAL IMPRESSION(S) / ED DIAGNOSES  Final diagnoses:  Suicidal ideation     MEDICATIONS GIVEN DURING THIS VISIT:  Medications  LORazepam (ATIVAN) injection 0-4 mg (has no administration in time range)    Or  LORazepam (ATIVAN) tablet 0-4 mg (has no administration in time range)  LORazepam (ATIVAN) injection 0-4 mg (has no administration in time range)    Or  LORazepam (ATIVAN) tablet 0-4 mg (has no administration in time range)  thiamine tablet 100 mg (has no administration in time range)    Or  thiamine (B-1) injection 100 mg (has no administration in time range)    Note:  This document was prepared using Dragon voice recognition software and may include unintentional dictation errors.  Alona Bene, MD, [facility]    Galaxy Borden, Arlyss Repress, MD 04/23/21 [phone]

## 2021-04-23 NOTE — ED Triage Notes (Signed)
Pt states he's "hungry," & "hasn't eaten in weeks." Endorses auditory hallucinations, denies SI/HI, just states "there's a chip inside." Calm & cooperative in triage

## 2021-04-24 ENCOUNTER — Encounter (HOSPITAL_COMMUNITY): Payer: Self-pay | Admitting: Registered Nurse

## 2021-04-24 ENCOUNTER — Other Ambulatory Visit: Payer: Self-pay

## 2021-04-24 DIAGNOSIS — F1024 Alcohol dependence with alcohol-induced mood disorder: Secondary | ICD-10-CM | POA: Diagnosis not present

## 2021-04-24 DIAGNOSIS — F332 Major depressive disorder, recurrent severe without psychotic features: Secondary | ICD-10-CM | POA: Diagnosis not present

## 2021-04-24 DIAGNOSIS — Z765 Malingerer [conscious simulation]: Secondary | ICD-10-CM | POA: Diagnosis not present

## 2021-04-24 DIAGNOSIS — F1920 Other psychoactive substance dependence, uncomplicated: Secondary | ICD-10-CM

## 2021-04-24 LAB — RESP PANEL BY RT-PCR (FLU A&B, COVID) ARPGX2
Influenza A by PCR: NEGATIVE
Influenza B by PCR: NEGATIVE
SARS Coronavirus 2 by RT PCR: NEGATIVE

## 2021-04-24 MED ORDER — ZIPRASIDONE MESYLATE 20 MG IM SOLR
INTRAMUSCULAR | Status: AC
  Administered 2021-04-24: 20 mg
  Filled 2021-04-24: qty 20

## 2021-04-24 MED ORDER — STERILE WATER FOR INJECTION IJ SOLN
INTRAMUSCULAR | Status: AC
  Filled 2021-04-24: qty 10

## 2021-04-24 NOTE — ED Notes (Signed)
Pt verbally report desires of killing himself and "kill others if necessary".

## 2021-04-24 NOTE — Progress Notes (Signed)
Patient information has been sent to [facility] via secure chat to review for potential admission. Patient meets inpatient criteria per Dr. Lucianne Muss.   Situation ongoing, CSW will continue to monitor progress.    Signed:  Damita Dunnings, MSW, LCSW-A  04/24/2021 9:38 AM

## 2021-04-24 NOTE — BH Assessment (Signed)
Comprehensive Clinical Assessment (CCA) Screening, Triage and Referral Note  04/24/2021 [name] [name] [id number]  DISPOSITION: Per Roselyn Bering, NP, she recommends that pt continue to be observed in the ED and later re-assessed by psychiatry for final disposition.  Advised RN C. Chrisco and EDP via Teacher, adult education.   The patient demonstrates the following risk factors for suicide: Chronic risk factors for suicide include: psychiatric disorder of Bipolar disorder . Acute risk factors for suicide include: loss (financial, interpersonal, professional) and mental illness . Protective factors for this patient include:  none identified . Considering these factors, the overall suicide risk at this point appears to be undetermined. Patient is appropriate for outpatient follow up.  Flowsheet Row ED from 04/23/2021 in [facility]GENC[facility]PART[facility] ED from 04/16/2021 in [facility] Admission (Discharged) [facility]  C-SSRS RISK CATEGORY Error: Question 6 not populated Error: Question 6 not populated Moderate Risk      Pt presented today voluntarily and unaccompanied to [facility] after multiple recent ED visits and several recent IP admissions. Pt appears to be at his baseline which is making suicidal threats, threats of violence toward others, not taking his prescribed medications and looking for food and shelter. Several ED visits in [place] and neighboring cities have resulted in "malingering" comments and discharge. On 2 recent occasions at [facility] (6/14-6/19/22) and [facility] [place] (5/28) he has been admitted and admitted no attempt to follow through on outpatient resources that were suggested and no attempt to take his prescribed medications. When asked why he stopped taking his medications, pt stated "they don't work." From the beginning of the assessment, pt was yelling obscenities at this Clinical research associate and making  threats to kill me with an ax because I was asking him questions. Pt made threats [place] people [place]. Pt stated that he had "wires inside my body" which he was going to [place] hang himself [place] if discharged. Pt denies actually hurting anyone or attempting to kill himself recently. When assessed on recent occasions, pt reported ongoing AVH and his RN reported last night that he was talking to himself. Pt denied any substance use. No UDS was done for this ED visit. His ETOH was 0. Pt has been asked to leave and escorted  out of several facilities per history.   Chief Complaint: No chief complaint on file.  Visit Diagnosis:  Bipolar disorder   Patient Reported Information How did you hear about [place]? Self  What Is the Reason for Your Visit/Call Today? Pt presented today voluntarily and unaccompanied to [facility] after multiple recent ED visits and several recent IP admissions. Pt appears to be at his baseline which is making suicidal threats, threats of violence toward others, not taking his prescribed medications and looking for food and shelter. Several ED visits in [place] and neighborhing cities have resulted in "malingering" comments and discharge. On 2 recent occasions at [facility] (6/14-6/19/22) and [facility] [place] (5/28) he has been admitted and admitted no attempt to follow through on outpatient resources that were suggested and no attempt to take his prescribed medications. Whena sked why he stopped taking his medications, pt stated "they don't work." From the beginning of the assessment, pt was yelling obsenities at this Clinical research associate and making threats to kill me with an ax because I was asking him questions. Pt made threats to kill people [place]. Pt stated that he had "wires inside my body" which he was going to [place] if discharged. When seen on recent occasions, pt reported  ongoing AVH and his RN reported last night that he was talking to himself. Pt denied any substance use. No UDS was done for this ED visit. His ETOH was 0.  How Long Has This Been Causing You Problems? > than 6 months  What Do You Feel Would Help You the Most Today? Food Assistance; Patent examiner; Transportation Assistance   Have You Recently Had Any Thoughts About Hurting Yourself? Yes  Are You Planning to Commit Suicide/Harm Yourself At This time? Yes   Have you Recently Had Thoughts About Hurting Someone [name] [name]? Yes  Are You Planning to Harm Someone at This Time? Yes  Explanation: No data recorded  Have You Used Any Alcohol or Drugs in the Past 24 Hours? No  How Long Ago Did You Use Drugs or Alcohol? No data recorded What Did You Use and How Much? No data recorded  Do You Currently Have a Therapist/Psychiatrist? No  Name of Therapist/Psychiatrist: No data recorded  Have You Been Recently Discharged From Any Office Practice or Programs? Yes  Explanation of Discharge From Practice/Program: Pt has been asked to leave and escorted out of several facilities per history.    CCA Screening Triage Referral Assessment Type of Contact: Tele-Assessment  Telemedicine Service Delivery:   Is this Initial or Reassessment? Initial Assessment  Date Telepsych consult ordered in [facility]:  04/23/21  Time Telepsych consult ordered in [facility]:  2223 (Pt was sleeping or RN could not move pt to a private place to accommadate the assessment initially.)  Location of Assessment: [facility] ED  Provider Location: [facility]   Collateral Involvement: none   Does Patient Have a Court Appointed Legal Guardian? No data recorded Name and Contact of Legal Guardian: No data recorded If Minor and Not Living with Parent(s), Who has Custody? No data recorded Is CPS involved or ever been involved? -- (UTA)  Is APS involved or ever been involved? -- (UTA)   Patient Determined To Be At Risk for  Harm To Self or Others Based on Review of Patient Reported Information or Presenting Complaint? -- (Possibly although pt denies actually hurting anyone or attempting to kill himself recently.)  Method: No data recorded Availability of Means: No data recorded Intent: No data recorded Notification Required: No data recorded Additional Information for Danger to Others Potential: Active psychosis  Additional Comments for Danger to Others Potential: No data recorded Are There Guns or Other Weapons in Your Home? -- (UTA)  Types of Guns/Weapons: No data recorded Are These Weapons Safely Secured?                            No data recorded Who Could Verify You Are Able To Have These Secured: No data recorded Do You Have any Outstanding Charges, Pending Court Dates, Parole/Probation? No data recorded Contacted To Inform of Risk of Harm To Self or Others: No data recorded  Does Patient Present under Involuntary Commitment? No  IVC Papers Initial File Date: No data recorded  [place] of Residence: [place]   Patient Currently Receiving the Following Services: No data recorded  Determination of Need: Urgent (48 hours)   Options For Referral: No data recorded  Discharge Disposition:     Carolanne Grumbling, Counselor

## 2021-04-24 NOTE — BH Assessment (Signed)
Disposition  Per Assunta Found, NP, patient is psych cleared. [facility] has recommended resources for homeless shelters, substance use facilities, and outpatient therapy/med management. Disposition Clinician has added resources for each services in patient's AVS. Notified patient's nurse of patient's disposition referrals.

## 2021-04-24 NOTE — ED Provider Notes (Signed)
At this time the patient is resting comfortably, alert and calm.  When I told him it was time to be discharged he said "okay motherfucker."   Mancel Bale, MD 04/24/21 2310

## 2021-04-24 NOTE — ED Notes (Signed)
The pt jerked away from the covid swab

## 2021-04-24 NOTE — Consult Note (Signed)
Telepsych Consultation   Reason for Consult:  Suicidal ideation, psychosis Referring Physician:  Long, Wonda Olds, MD Location of Patient: [facility] ED Location of Provider: Other: [facility]  Patient Identification: [name] [name] MRN:  [id number] Principal Diagnosis: Bipolar affective disorder ([place]) Diagnosis:  Principal Problem:   Bipolar affective disorder ([place]) Active Problems:   Polysubstance (including opioids) dependence with physiological dependence ([place])   Suicidal ideations   Alcohol dependence with alcohol-induced mood disorder ([place])   Malingering   Total Time spent with patient: 30 minutes  Subjective:   [name] is a 45 y.o. male patient admitted [facility] ED with complaints of suicidal ideation  HPI:  [name] [name], 45 y.o., male patient seen via tele health by this provider, consulted with Dr. Hampton Abbot; and chart reviewed on 04/24/21.  On evaluation [name] [name] reports he came to the hospital because he was having suicidal thoughts and hearing voices.  Patient did not give plan for suicidal ideation only stating "I'm gonna kill my damn self."  Patient stating that he is hearing evil voices telling him to kill himself. States he is talking to 71 different people that are telling him to do stuff like kill himself.  During assessment patient doesn't appear to be responding to internal/external stimuli (auditory/visual hallucinations) or delusional thinking.  Patient states he is no longer taking his psychotropic medications that were started during his last admission at [place] [facility] 6/140-04/09/21.  Patient also states he has not followed up with any outpatient psychiatric services that were given at discharged.  Patient asked why he stopped taking his medications, his response "They won't helping just like they won't helping before I left.  I threw them on the ground when I left.  They were useless.  They didn't do anything."  Patient informed there have been several visits with medications and  referrals for outpatient psychiatric services and he has not followed up with any and have not continued any of the medications; patient asked why, and he stated "I'm gonna kill my damn self; so, when people die; will you deal with that.  I fucking need to go to a hospital to get my brain together.  I'm fucking losing my mind.  The medicine didn't work.  I don't have a car or a phone.  How was I going to get there or call?  Naw they just say let me go and walk me out the door; they don't think I gonna sue for that shit.  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  I'm telling you I need to go to a hospital to get on medication.  You do your job.  You're suppose to tell the doctor that I need to go to the hospital.  That's your job.  You don't need to talk to me about my medications.  Patient informed that this provider is a Designer, jewellery, and I could talk to him about his medications and determine if he met criteria for psychiatric admission.   Patient states "well I told you what I needed."  Patient did eventually state that Zyprexa and Thorazine has worked for him in the past.    Patient has had several psychiatric admissions [facility] psychiatric unit on Admission [place] 03/18/2021, Multiple Urgent care and ED visits with similar or same complaint of suicidal and auditory hallucinations 03/30/21, 04/01/21 at [facility] (multiple visits on same day on some occasions) diagnosed with suicidal ideation and malingering.  Admission [facility] 6/14 - 04/09/21.    Recently patient has had several psychiatric admissions and several occasions where he has been observed overnight.  There have been no instances where unsafe behavior was observed.   Patient has a history of endorsing suicidal ideation to be admitted to psychiatric hospital.  If no admission patient becomes angry and belligerent to staff and has had to be escorted off property.  During admission or observation there has  been no unsafe behavior.  There is no evidence of a psychiatric condition which is amendable to inpatient psychiatric hospitalization.  Patients' suicidal ideation appears to be conditional and is a means of manipulation and attention seeking without a true intention to harm himself.  Patient has not benefited from past hospitalization under similar circumstances in terms of suicide risk modification or improvement in mental health or social conditions.  Patients repeated use of emergency department and inpatient services instead of recommended outpatient follow-up is ineffective in helping improvement.  Inpatient hospitalization is not indicated, and the patient is refusing interventions that the clinical care team has offered in the emergency department and prior inpatient clinical care team has offered to mitigate risk of self-harm. Patient's behavior is more consistent with someone who is acting in self-preservation, rather than someone who is acutely suicidal.  Given all that is stated above and including the fact that the patient shows no signs of mania or psychosis and has a liner, coherent thought process throughout the interview today, the patient does not meet criteria for inpatient psychiatric treatment or further psychiatric evaluation at this time.  Patient would benefit from outpatient treatment and resources will be provided to him prior to discharge Evidence indicates that subsequent suicide attempts by patients who made contingent suicide threats (defined as threatened suicide or exaggerated suicidality) are uncommon in both groups.  Hospitalization should not be used as a substitute for social services, substance abuse treatment, and legal assistance for patients who make contingent suicide threats (Characteristics and six-month outcome of patients who use suicide threats to seek hospital admission.  (1966). Psychiatric Services, 47(8), [phone]. (DOI: 10.1176/ps.47.8.871).    Past Psychiatric  History: Schizoaffective, malingering, depression, suicidal ideation  Risk to Self:  No.  Chronic history of suicidal ideation and psychosis for secondary gain (attention seeking, shelter) Risk to Others:  No Prior Inpatient Therapy:  Yes Prior Outpatient Therapy:  Yes  Past Medical History:  Past Medical History:  Diagnosis Date   Bipolar disorder ([place])    Cellulitis    Depression    Drug abuse ([place])    Hepatitis C     Past Surgical History:  Procedure Laterality Date   BACK SURGERY     ROTATOR CUFF REPAIR     SHOULDER SURGERY     Family History:  Family History  Problem Relation Age of Onset   Alcohol abuse Father    Cancer Maternal Aunt    Family Psychiatric  History: See above Social History:  Social History   Substance and Sexual Activity  Alcohol Use Not Currently   Alcohol/week: 12.0 standard drinks   Types: 12 Cans of beer per week   Comment: heavy     Social History   Substance and Sexual Activity  Drug Use Not Currently   Types: Marijuana, Benzodiazepines, Other-see comments, Methamphetamines, Cocaine    Social History   Socioeconomic History   Marital status: Divorced    Spouse name: Not on file   Number of children: Not on file   Years of  education: Not on file   Highest education level: Not on file  Occupational History   Not on file  Tobacco Use   Smoking status: Every Day    Packs/day: 1.00    Years: 31.00    Pack years: 31.00    Types: Cigarettes    Start date: 10/22/1981   Smokeless tobacco: Never  Vaping Use   Vaping Use: Never used  Substance and Sexual Activity   Alcohol use: Not Currently    Alcohol/week: 12.0 standard drinks    Types: 12 Cans of beer per week    Comment: heavy   Drug use: Not Currently    Types: Marijuana, Benzodiazepines, Other-see comments, Methamphetamines, Cocaine   Sexual activity: Not Currently  Other Topics Concern   Not on file  Social History Narrative   Not on file   Social Determinants of  Health   Financial Resource Strain: Not on file  Food Insecurity: Not on file  Transportation Needs: Not on file  Physical Activity: Not on file  Stress: Not on file  Social Connections: Not on file   Additional Social History:    Allergies:   Allergies  Allergen Reactions   Lithium Anaphylaxis   Penicillins Rash    Has patient had a PCN reaction causing immediate rash, facial/tongue/throat swelling, SOB or lightheadedness with hypotension: Yes Has patient had a PCN reaction causing severe rash involving mucus membranes or skin necrosis: No Has patient had a PCN reaction that required hospitalization: No Has patient had a PCN reaction occurring within the last 10 years: No If all of the above answers are "NO", then may proceed with Cephalosporin use.     Labs:  Results for orders placed or performed during the hospital encounter of 04/23/21 (from the past 48 hour(s))  Comprehensive metabolic panel     Status: None   Collection Time: 04/23/21  9:57 PM  Result Value Ref Range   Sodium 140 135 - 145 mmol/L   Potassium 3.8 3.5 - 5.1 mmol/L   Chloride 106 98 - 111 mmol/L   CO2 24 22 - 32 mmol/L   Glucose, Bld 94 70 - 99 mg/dL    Comment: Glucose reference range applies only to samples taken after fasting for at least 8 hours.   BUN 18 6 - 20 mg/dL   Creatinine, Ser 0.73 0.61 - 1.24 mg/dL   Calcium 9.4 8.9 - 10.3 mg/dL   Total Protein 6.9 6.5 - 8.1 g/dL   Albumin 4.0 3.5 - 5.0 g/dL   AST 30 15 - 41 U/L   ALT 29 0 - 44 U/L   Alkaline Phosphatase 65 38 - 126 U/L   Total Bilirubin 0.9 0.3 - 1.2 mg/dL   GFR, Estimated >60 >60 mL/min    Comment: (NOTE) Calculated using the CKD-EPI Creatinine Equation (2021)    Anion gap 10 5 - 15    Comment: Performed at [facility] [address], [place], [place] [address]  Ethanol     Status: None   Collection Time: 04/23/21  9:57 PM  Result Value Ref Range   Alcohol, Ethyl (B) <10 <10 mg/dL    Comment: (NOTE) Lowest detectable  limit for serum alcohol is 10 mg/dL.  For medical purposes only. Performed at [facility], [place] [address], [place], [place] [address]   Salicylate level     Status: Abnormal   Collection Time: 04/23/21  9:57 PM  Result Value Ref Range   Salicylate Lvl <5.8 (  L) 7.0 - 30.0 mg/dL    Comment: Performed at [facility], [place] [address] [place], [place] [address]  Acetaminophen level     Status: Abnormal   Collection Time: 04/23/21  9:57 PM  Result Value Ref Range   Acetaminophen (Tylenol), Serum <10 (L) 10 - 30 ug/mL    Comment: (NOTE) Therapeutic concentrations vary significantly. A range of 10-30 ug/mL  may be an effective concentration for many patients. However, some  are best treated at concentrations outside of this range. Acetaminophen concentrations >150 ug/mL at 4 hours after ingestion  and >50 ug/mL at 12 hours after ingestion are often associated with  toxic reactions.  Performed at [facility], [place] [address] [place], [place] [address]   cbc     Status: Abnormal   Collection Time: 04/23/21  9:57 PM  Result Value Ref Range   WBC 14.7 (H) 4.0 - 10.5 K/uL   RBC 4.70 4.22 - 5.81 MIL/uL   Hemoglobin 14.7 13.0 - 17.0 g/dL   HCT 44.8 39.0 - 52.0 %   MCV 95.3 80.0 - 100.0 fL   MCH 31.3 26.0 - 34.0 pg   MCHC 32.8 30.0 - 36.0 g/dL   RDW 13.4 11.5 - 15.5 %   Platelets 287 150 - 400 K/uL   nRBC 0.0 0.0 - 0.2 %    Comment: Performed at [facility], [place] [address] [place], [place] [address]  Resp Panel by RT-PCR (Flu A&B, Covid) Nasopharyngeal Swab     Status: None   Collection Time: 04/23/21 11:23 PM   Specimen: Nasopharyngeal Swab; Nasopharyngeal(NP) swabs in vial transport medium  Result Value Ref Range   SARS Coronavirus 2 by RT PCR NEGATIVE NEGATIVE    Comment: (NOTE) SARS-CoV-2 target nucleic acids are NOT DETECTED.  The SARS-CoV-2 RNA is generally detectable in upper respiratory specimens during the acute phase of infection. The  lowest concentration of SARS-CoV-2 viral copies this assay can detect is 138 copies/mL. A negative result does not preclude SARS-Cov-2 infection and should not be used as the sole basis for treatment or other patient management decisions. A negative result may occur with  improper specimen collection/handling, submission of specimen other than nasopharyngeal swab, presence of viral mutation(s) within the areas targeted by this assay, and inadequate number of viral copies(<138 copies/mL). A negative result must be combined with clinical observations, patient history, and epidemiological information. The expected result is Negative.  Fact Sheet for Patients:  EntrepreneurPulse.com.au  Fact Sheet for Healthcare Providers:  IncredibleEmployment.be  This test is no t yet approved or cleared by the [place] FDA and  has been authorized for detection and/or diagnosis of SARS-CoV-2 by FDA under an Emergency Use Authorization (EUA). This EUA will remain  in effect (meaning this test can be used) for the duration of the COVID-19 declaration under Section 564(b)(1) of the Act, 21 U.S.C.section 360bbb-3(b)(1), unless the authorization is terminated  or revoked sooner.       Influenza A by PCR NEGATIVE NEGATIVE   Influenza B by PCR NEGATIVE NEGATIVE    Comment: (NOTE) The Xpert Xpress SARS-CoV-2/FLU/RSV plus assay is intended as an aid in the diagnosis of influenza from Nasopharyngeal swab specimens and should not be used as a sole basis for treatment. Nasal washings and aspirates are unacceptable for Xpert Xpress SARS-CoV-2/FLU/RSV testing.  Fact Sheet for Patients: EntrepreneurPulse.com.au  Fact Sheet for Healthcare Providers: IncredibleEmployment.be  This test is not yet approved or cleared by the [place]  FDA and has been authorized for detection and/or diagnosis of SARS-CoV-2 by FDA under an Emergency  Use Authorization (EUA). This EUA will remain in effect (meaning this test can be used) for the duration of the COVID-19 declaration under Section 564(b)(1) of the Act, 21 U.S.C. section 360bbb-3(b)(1), unless the authorization is terminated or revoked.  Performed at [facility], [address], [place], [place] [address]     Medications:  Current Facility-Administered Medications  Medication Dose Route Frequency Provider Last Rate Last Admin   LORazepam (ATIVAN) injection 0-4 mg  0-4 mg Intravenous Q6H Long, Arlyss Repress, MD       Or   LORazepam (ATIVAN) tablet 0-4 mg  0-4 mg Oral Q6H Long, Arlyss Repress, MD       [START ON 04/26/2021] LORazepam (ATIVAN) injection 0-4 mg  0-4 mg Intravenous Q12H Long, Arlyss Repress, MD       Or   Melene Muller ON 04/26/2021] LORazepam (ATIVAN) tablet 0-4 mg  0-4 mg Oral Q12H Long, Arlyss Repress, MD       thiamine tablet 100 mg  100 mg Oral Daily Long, Arlyss Repress, MD       Or   thiamine (B-1) injection 100 mg  100 mg Intravenous Daily Long, Arlyss Repress, MD       Current Outpatient Medications  Medication Sig Dispense Refill   doxepin (SINEQUAN) 25 MG capsule Take 1 capsule (25 mg total) by mouth at bedtime. 14 capsule 0   furosemide (LASIX) 20 MG tablet Take 1 tablet (20 mg total) by mouth 2 (two) times daily. 14 tablet 0   gabapentin (NEURONTIN) 400 MG capsule Take 2 capsules (800 mg total) by mouth 3 (three) times daily. 26 capsule 0   metoprolol tartrate (LOPRESSOR) 50 MG tablet Take 1 tablet (50 mg total) by mouth 2 (two) times daily. 14 tablet 0   QUEtiapine (SEROQUEL) 100 MG tablet Take 1 tablet (100 mg total) by mouth at bedtime. 14 tablet 0   sertraline (ZOLOFT) 100 MG tablet Take 1 tablet (100 mg total) by mouth daily. 14 tablet 0    Musculoskeletal: Strength & Muscle Tone: within normal limits Gait & Station: normal Patient leans: N/A   Psychiatric Specialty Exam:  Presentation  General Appearance: Appropriate for Environment  Eye  Contact:Good  Speech:Clear and Coherent; Normal Rate  Speech Volume:Normal  Handedness:Right   Mood and Affect  Mood:Irritable  Affect:Congruent   Thought Process  Thought Processes:Coherent; Goal Directed  Descriptions of Associations:Intact  Orientation:Full (Time, Place and Person)  Thought Content:WDL  History of Schizophrenia/Schizoaffective disorder:No  Duration of Psychotic Symptoms:Greater than six months  Hallucinations:Hallucinations: Auditory Description of Auditory Hallucinations: Patient reporting he is hearing voices telling him to "do all kinds of stuff.  I'm talking to 12 different peopld telling me to do stuff."  Ideas of Reference:None  Suicidal Thoughts:Suicidal Thoughts: Yes, Active (Patient stating "I'm gonna kill my damn self.  So whent that happens may be you'll take the damn time deal with my needs.") SI Passive Intent and/or Plan: Without Plan  Homicidal Thoughts:Homicidal Thoughts: No   Sensorium  Memory:Immediate Good; Recent Good  Judgment:Fair  Insight:Shallow   Executive Functions  Concentration:Good  Attention Span:Good  Recall:Good  Fund of Knowledge:Fair  Language:Fair   Psychomotor Activity  Psychomotor Activity:Psychomotor Activity: Normal   Assets  Assets:Communication Skills; Desire for Improvement   Sleep  Sleep:Sleep: Good    Physical Exam: Physical Exam Vitals and nursing note reviewed. Exam conducted with a chaperone present.  Constitutional:      General: He is not in acute distress.    Appearance: Normal appearance. He is not ill-appearing.  Cardiovascular:     Rate and Rhythm: Normal rate.  Pulmonary:     Effort: Pulmonary effort is normal.  Neurological:     Mental Status: He is alert and oriented to person, place, and time.  Psychiatric:        Attention and Perception: Attention and perception normal. He does not perceive auditory or visual hallucinations.        Mood and Affect:  Affect is angry.        Speech: Speech normal.        Behavior: Behavior is agitated.        Thought Content: Thought content is not paranoid or delusional. Thought content does not include homicidal ideation.        Cognition and Memory: Cognition and memory normal.        Judgment: Judgment normal.   Review of Systems  Constitutional: Negative.   HENT: Negative.    Eyes: Negative.   Respiratory: Negative.    Cardiovascular: Negative.   Gastrointestinal: Negative.   Genitourinary: Negative.   Musculoskeletal: Negative.   Skin: Negative.   Neurological: Negative.   Endo/Heme/Allergies: Negative.   Psychiatric/Behavioral:  Hallucinations: Reoprting auditory hallucinations.. Suicidal ideas: Reporting suicidal ideation. The patient does not have insomnia.        Patient reporting suicidal ideation and psychosis.  Patient admits that he has not taking any of the medications that have been prescribed and has not follow up with any resources given.    Blood pressure (!) 129/105, pulse 88, temperature 98.2 F (36.8 C), resp. rate 16, SpO2 98 %. There is no height or weight on file to calculate BMI.  Treatment Plan Summary: Plan Psychiatrically clear.  Follow up with resources provided for outpatient psychiatric services and community resources  Disposition: No evidence of imminent risk to self or others at present.   Patient does not meet criteria for psychiatric inpatient admission. Supportive therapy provided about ongoing stressors. Refer to IOP. Discussed crisis plan, support from social network, calling 911, coming to the Emergency Department, and calling Suicide Hotline.  This service was provided via telemedicine using a 2-way, interactive audio and video technology.  Names of all persons participating in this telemedicine service and their role in this encounter. Name: Earleen Newport, NP Role: PMHNP  Name: Dr. Hampton Abbot Role: Psychiatrist  Name: [name] [name] Role: Patient   Name:  Charise Carwin, RN Role: Patient's nurse sent a secure message informing: Psychiatric consult completed and patient has been psychiatrically cleared.  Outpatient psychiatric services and community resources has been added to patients AVS discharge instructions.  Please inform MD only default listed      Keon Pender, NP 04/24/2021 11:56 AM

## 2021-04-24 NOTE — Discharge Instructions (Addendum)
Follow-up for treatment as recommended on the attached paperwork.
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
## 2021-04-24 NOTE — ED Notes (Signed)
ED Provider at bedside. 

## 2021-04-24 NOTE — ED Notes (Signed)
After EDP assessed patient and advised of impending Discharge pt became upset; RN attempted to discharge patient but patient walked toward exit and states "I'm going to hang myself in front of them..patient walked to vital signs machine and took the cord and put in around his neck; staff in unit intervened while RN notified EDP; EDP noted pt's past behavior of the same and being manipulative; pt was being aggressive with staff when RN returned to advised that EDP states to continue with discharge; pt threw d/c papers at staff and walked out to the lobby and put on his clothes; Security called to escort patient off property-Monique,RN
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
## 2021-06-14 ENCOUNTER — Other Ambulatory Visit: Payer: Self-pay

## 2021-06-14 ENCOUNTER — Ambulatory Visit (INDEPENDENT_AMBULATORY_CARE_PROVIDER_SITE_OTHER)
Admission: EM | Admit: 2021-06-14 | Discharge: 2021-06-14 | Disposition: A | Discharge status: Other Acute Inpatient Hospital | Payer: Medicare HMO | Source: Home / Self Care

## 2021-06-14 ENCOUNTER — Encounter (HOSPITAL_COMMUNITY): Payer: Self-pay

## 2021-06-14 ENCOUNTER — Emergency Department (HOSPITAL_COMMUNITY)
Admission: EM | Admit: 2021-06-14 | Discharge: 2021-06-15 | Disposition: A | Discharge status: Other Acute Inpatient Hospital | Payer: Medicare HMO | Attending: Emergency Medicine | Admitting: Emergency Medicine

## 2021-06-14 DIAGNOSIS — F1010 Alcohol abuse, uncomplicated: Secondary | ICD-10-CM | POA: Diagnosis not present

## 2021-06-14 DIAGNOSIS — Z046 Encounter for general psychiatric examination, requested by authority: Secondary | ICD-10-CM | POA: Diagnosis present

## 2021-06-14 DIAGNOSIS — F29 Unspecified psychosis not due to a substance or known physiological condition: Secondary | ICD-10-CM | POA: Insufficient documentation

## 2021-06-14 DIAGNOSIS — R45851 Suicidal ideations: Secondary | ICD-10-CM | POA: Insufficient documentation

## 2021-06-14 DIAGNOSIS — Z20822 Contact with and (suspected) exposure to covid-19: Secondary | ICD-10-CM | POA: Diagnosis not present

## 2021-06-14 DIAGNOSIS — Z79899 Other long term (current) drug therapy: Secondary | ICD-10-CM | POA: Diagnosis not present

## 2021-06-14 DIAGNOSIS — F23 Brief psychotic disorder: Secondary | ICD-10-CM

## 2021-06-14 DIAGNOSIS — F17210 Nicotine dependence, cigarettes, uncomplicated: Secondary | ICD-10-CM | POA: Diagnosis not present

## 2021-06-14 DIAGNOSIS — Y906 Blood alcohol level of 120-199 mg/100 ml: Secondary | ICD-10-CM | POA: Diagnosis not present

## 2021-06-14 DIAGNOSIS — R45850 Homicidal ideations: Secondary | ICD-10-CM | POA: Insufficient documentation

## 2021-06-14 LAB — COMPREHENSIVE METABOLIC PANEL
ALT: 10 U/L (ref 0–44)
AST: 25 U/L (ref 15–41)
Albumin: 4.5 g/dL (ref 3.5–5.0)
Alkaline Phosphatase: 67 U/L (ref 38–126)
Anion gap: 12 (ref 5–15)
BUN: 9 mg/dL (ref 6–20)
CO2: 24 mmol/L (ref 22–32)
Calcium: 9.9 mg/dL (ref 8.9–10.3)
Chloride: 108 mmol/L (ref 98–111)
Creatinine, Ser: 0.74 mg/dL (ref 0.61–1.24)
GFR, Estimated: >60 mL/min (ref >60)
Glucose, Bld: 84 mg/dL (ref 70–99)
Potassium: 4.9 mmol/L (ref 3.5–5.1)
Sodium: 144 mmol/L (ref 135–145)
Total Bilirubin: 1.2 mg/dL (ref 0.3–1.2)
Total Protein: 7.1 g/dL (ref 6.5–8.1)

## 2021-06-14 LAB — SALICYLATE LEVEL: Salicylate Lvl: <7 mg/dL — ABNORMAL LOW (ref 7.0–30.0)

## 2021-06-14 LAB — CBC WITH DIFFERENTIAL/PLATELET
Abs Immature Granulocytes: 0.03 10*3/uL (ref 0.00–0.07)
Basophils Absolute: 0.1 10*3/uL (ref 0.0–0.1)
Basophils Relative: 1 %
Eosinophils Absolute: 0.9 10*3/uL — ABNORMAL HIGH (ref 0.0–0.5)
Eosinophils Relative: 8 %
HCT: 46.8 % (ref 39.0–52.0)
Hemoglobin: 15.7 g/dL (ref 13.0–17.0)
Immature Granulocytes: 0 %
Lymphocytes Relative: 42 %
Lymphs Abs: 4.7 10*3/uL — ABNORMAL HIGH (ref 0.7–4.0)
MCH: 31.5 pg (ref 26.0–34.0)
MCHC: 33.5 g/dL (ref 30.0–36.0)
MCV: 94 fL (ref 80.0–100.0)
Monocytes Absolute: 1 10*3/uL (ref 0.1–1.0)
Monocytes Relative: 9 %
Neutro Abs: 4.5 10*3/uL (ref 1.7–7.7)
Neutrophils Relative %: 40 %
Platelets: 314 10*3/uL (ref 150–400)
RBC: 4.98 MIL/uL (ref 4.22–5.81)
RDW: 13.2 % (ref 11.5–15.5)
WBC: 11.1 10*3/uL — ABNORMAL HIGH (ref 4.0–10.5)
nRBC: 0 % (ref 0.0–0.2)

## 2021-06-14 LAB — RAPID URINE DRUG SCREEN, HOSP PERFORMED
Amphetamines: NOT DETECTED
Barbiturates: NOT DETECTED
Benzodiazepines: NOT DETECTED
Cocaine: NOT DETECTED
Opiates: NOT DETECTED
Tetrahydrocannabinol: NOT DETECTED

## 2021-06-14 LAB — ETHANOL: Alcohol, Ethyl (B): 133 mg/dL — ABNORMAL HIGH (ref <10)

## 2021-06-14 LAB — ACETAMINOPHEN LEVEL: Acetaminophen (Tylenol), Serum: <10 ug/mL — ABNORMAL LOW (ref 10–30)

## 2021-06-14 LAB — RESP PANEL BY RT-PCR (FLU A&B, COVID) ARPGX2
Influenza A by PCR: NEGATIVE
Influenza B by PCR: NEGATIVE
SARS Coronavirus 2 by RT PCR: NEGATIVE

## 2021-06-14 MED ORDER — OLANZAPINE 10 MG IM SOLR: 10.0000 mg | Freq: Once | INTRAMUSCULAR | Status: DC | PRN

## 2021-06-14 MED ORDER — OLANZAPINE 10 MG PO TABS: 10.0000 mg | ORAL_TABLET | Freq: Once | ORAL | Status: DC | PRN

## 2021-06-14 NOTE — Progress Notes (Addendum)
ADDENDUM  CSW informed by [facility] there are no available/appropriate beds. CSW will pursue alternative placement.   Signed:  Corky Crafts, MSW, [place], LCASA 06/14/2021 12:51 PM  Patient information has been sent to [facility] via secure chat to review for potential admission. Patient has not yet been accepted at this time. Patient meets inpatient criteria per Eliseo Gum, MD.   Situation ongoing, CSW will continue to monitor and update note as more information becomes available.    Signed:  Corky Crafts, MSW, [place], LCASA 06/14/2021 12:03 PM

## 2021-06-14 NOTE — ED Provider Notes (Signed)
Behavioral Health Urgent Care Medical Screening Exam  Patient Name: [name] [name] MRN: [id number] Date of Evaluation: 06/14/21 Chief Complaint:   "I being tracked by aliens." Diagnosis:  Final diagnoses:  Psychosis, unspecified psychosis type (HCC)    History of Present illness: [name] [name] is a 45 y.o. male w/ PPH of Bipolar disorder vs schizoaffective disorder and polysubstance abuse who presents via [employer]. Patient was at [facility] 03/2021 and in [facility] psych unit 02/2021 with multiple other psychiatric hospitalizations in his past.  On assessment patient appears in dirty, burgundy psych scrubs and reports he got them from the "street." Per [employer] records patient was picked up on the side of the rode and was drinking EtoH. Patient reports he does not know where he is. When assessed for orientation patient guesses that it is the year  "202000 and that he is on [place].) Patient reports that he has been living [place] the [place]", and is [place] the [place] Patient reports that the "aliens" have planted a tracker in his brain and they have been tracking him for 9-12 years (patient said both at different points in the assessment). Patient reports that he will jump of a bridge so all of the information inside of him will "bust out" and that the aliens don't want his information to get out. Patient reports he has not used any THC, meth, cocaine or any "drugs" because "I may be a poisonous and the aliens want that!" Patient reports he does drink "liquor and wine, not Alcohol!" And reports that he poors some of his drinks out "so the aliens can see what I'm doing." Patient reports he will kill everyone "at the [facility], [employer], everywhere because they are watching me!." Patient reports the aliens watch him all the time. Patient reports he is not taking any medications. Patient is unable to answer questions concerning AVH as he is more occupied with talking about Aliens tracking him and what  they want and do. Patient does report he has not slept in 1 month and he is very confused and then goes on to mention again that he will jump off a bridge.  Psychiatric Specialty Exam  Presentation  General Appearance:Disheveled  Eye Contact:None  Speech:Pressured  Speech Volume:Increased  Handedness:Right   Mood and Affect  Mood:Anxious  Affect:Congruent   Thought Process  Thought Processes:Disorganized  Descriptions of Associations:Loose  Orientation:None  Thought Content:Paranoid Ideation  Diagnosis of Schizophrenia or Schizoaffective disorder in past: Yes  Duration of Psychotic Symptoms: Greater than six months  Hallucinations:-- (not endorsing, but patient not really able to answer the question straightforward due to his disorganization) Patient reporting he is hearing voices telling him to "do all kinds of stuff.  I'm talking to 12 different peopld telling me to do stuff."  Ideas of Reference:Paranoia; Delusions  Suicidal Thoughts:Yes, Active With Plan Without Plan  Homicidal Thoughts:Yes, Active With Plan   Sensorium  Memory:Immediate Poor; Recent Poor; Remote Poor  Judgment:Impaired  Insight:None   Executive Functions  Concentration:Poor  Attention Span:Poor  Recall:Poor  Fund of Knowledge:Poor  Language:Fair   Psychomotor Activity  Psychomotor Activity:Increased   Assets  Assets:Resilience   Sleep  Sleep:Poor  Number of hours: 8   No data recorded  Physical Exam: Physical Exam HENT:     Head: Normocephalic and atraumatic.     Nose: Nose normal.  Eyes:     Extraocular Movements: Extraocular movements intact.     Pupils: Pupils are equal, round, and reactive to light.  Cardiovascular:     Rate and Rhythm: Normal rate.     Pulses: Normal pulses.  Pulmonary:     Effort: Pulmonary effort is normal.  Musculoskeletal:        General: Normal range of motion.  Skin:    General: Skin is warm and dry.  Neurological:      Mental Status: He is alert. He is disoriented.   Review of Systems  Constitutional:  Negative for chills and fever.  HENT:  Negative for hearing loss.   Eyes:  Negative for blurred vision.  Respiratory:  Negative for cough and wheezing.   Cardiovascular:  Negative for chest pain.  Gastrointestinal:  Negative for abdominal pain.  Neurological:  Negative for dizziness.  Psychiatric/Behavioral:  Positive for substance abuse and suicidal ideas. The patient is nervous/anxious.   Blood pressure 118/77, pulse 96, temperature 98.1 F (36.7 C), temperature source Oral, resp. rate 16, SpO2 96 %. There is no height or weight on file to calculate BMI.  Musculoskeletal: Strength & Muscle Tone: within normal limits Gait & Station: unsteady Patient leans: Front   [facility] M[facility] Discharge Disposition for Follow up and Recommendations: Based on my evaluation I certify that psychiatric inpatient services furnished can reasonably be expected to improve the patient's condition which I recommend transfer to an appropriate accepting facility.  Psychosis, unspecified At this time is is unclear if patient has psychosis induced by EtoH/ other substances vs having a baseline psychosis (worsened by more acutely by recent substance ingestion)b from prior dx Bipolar disorder vs schizoaffective disorder. Patient has a prominent psychiatric hx and per EMR most often present endorsing SI. At this time patient is too disoriented to be a reliable historian and is not capable of making decisions regarding his care. Patient is clearly psychotic with delusions leading to SI and HI and based on his hx and report of not taking medications recently would warrant inpatient level of care. - Petition for IVC - Patient very agitated and responding to internal stimuli will order PRN Zyprexa 10mg  PO or IM if patient refuses PO - Recommend inpatient - Routine labs for inpatient admissions  PGY-2 , MD 06/14/2021, 11:50 AM

## 2021-06-14 NOTE — ED Notes (Addendum)
Informed Pt that he could not move from one assessment room to another without staff involved. Pt became angry and began yelling at this nurse, standing very close and calling this nurse a cunt, fat, blond, bitch, etc. Security called and other staff members present. Safety maintained.

## 2021-06-14 NOTE — Progress Notes (Signed)
Patient meets inpatient criteria per Eliseo Gum, MD. Per [facility], no available/appropriate beds available. Disposition CSW awaiting labs to refer to out of network facilities. Situation ongoing, CSW will continue to monitor and update note as more information becomes available.   Signed:  Corky Crafts, MSW, [place], LCASA 06/14/2021 4:31 PM

## 2021-06-14 NOTE — BHH Counselor (Signed)
This counselor has completed Affidavit and Petition as well as First Examination. This counselor has received Custody Order from [employer] Per Casimiro Needle, CSW [facility] is not accepting patient as [address] hall is at capacity. This counselor will call for service and transport once MD identifies appropriate ED.

## 2021-06-14 NOTE — ED Notes (Signed)
Pt became upset when told he was going to be admitting. He denied any SI/HI ideations at this time. He states "I want my shoes and I want to go. I don't need impatient care. Physician notified.

## 2021-06-14 NOTE — ED Provider Notes (Signed)
[facility] EMERGENCY DEPARTMENT Provider Note   CSN: [id number] Arrival date & time:        History Chief Complaint  Patient presents with   Medical Clearance   Psychiatric Evaluation    [name] [name] is a 45 y.o. male with history of bipolar disorder versus schizoaffective disorder, polysubstance abuse that presents emerged department today from [facility]. Patient was at [facility]  today, brought in by [employer], was picked up on the side of the road and was drinking alcohol..  During the assessment it was unclear patient has psychosis induced by alcohol or his baseline psychosis, was too disoriented for reliable history and is not capable of making decisions at that time.  They state that he is clearly psychotic with delusions leading to SI and HI and warranted inpatient level of care, currently under IVC.  They recommended inpatient and transferred to the hospital for routine labs and inpatient admission.  When speaking to me, patient is extremely calm and cooperative, he denies any SI or HI to me.  Does not appear intoxicated.  Has no complaints at this time.  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   ETOH abuse    Cirrhosis (HCC) 05/24/2015   Suicidal ideation    Recurrent cellulitis of lower leg 11/09/2014   Venous (peripheral) insufficiency 11/09/2014   Bipolar affective disorder (HCC)    Alcohol dependence with alcohol-induced mood disorder (HCC)    Opioid dependence with opioid-induced mood disorder (HCC)    MDD (major depressive disorder)  09/09/2014   Suicidal ideations    Chronic pain syndrome 05/31/2014   Tobacco dependence 05/30/2014   Poor dentition 02/03/2014   Hepatitis C virus infection without hepatic coma 02/03/2014   Sepsis (HCC) 01/29/2014   Anxiety state, unspecified 01/31/2013   Mood disorder in conditions classified elsewhere 01/31/2013   Polysubstance (including opioids) dependence with physiological dependence (HCC) 01/27/2013    Class: Acute   Alcohol withdrawal (HCC) 01/27/2013    Class: Acute    Past Surgical History:  Procedure Laterality Date   BACK SURGERY     ROTATOR CUFF REPAIR     SHOULDER SURGERY         Family History  Problem Relation Age of Onset   Alcohol abuse Father    Cancer Maternal Aunt     Social History   Tobacco Use   Smoking status: Every Day    Packs/day: 1.00    Years: 31.00    Pack years: 31.00    Types: Cigarettes    Start date: 10/22/1981   Smokeless tobacco: Never  Vaping Use   Vaping Use: Never used  Substance Use Topics   Alcohol use: Not Currently    Alcohol/week: 12.0 standard drinks    Types: 12 Cans of beer per week    Comment: denies   Drug use: Not Currently    Types: Marijuana, Benzodiazepines, Other-see comments, Methamphetamines, Cocaine    Comment: denies    Home Medications Prior to Admission medications  Medication Sig Start Date End Date Taking? Authorizing Provider  doxepin (SINEQUAN) 25 MG capsule Take 1 capsule (25 mg total) by mouth at bedtime. 04/09/21   Antonieta Pert, MD  furosemide (LASIX) 20 MG tablet Take 1 tablet (20 mg total) by mouth 2 (two) times daily. 04/09/21   Antonieta Pert, MD  gabapentin (NEURONTIN) 400 MG capsule Take 2 capsules (800 mg total) by mouth 3 (three) times daily. 04/09/21   Antonieta Pert, MD  metoprolol tartrate (LOPRESSOR) 50 MG tablet Take 1 tablet (50 mg total) by mouth 2 (two) times daily. 04/09/21   Antonieta Pert, MD  QUEtiapine (SEROQUEL) 100 MG tablet Take 1 tablet (100 mg total)  by mouth at bedtime. 04/09/21   Antonieta Pert, MD  sertraline (ZOLOFT) 100 MG tablet Take 1 tablet (100 mg total) by mouth daily. 04/10/21   Antonieta Pert, MD    Allergies    Lithium and Penicillins  Review of Systems   Review of Systems  Unable to perform ROS: Psychiatric disorder   Physical Exam Updated Vital Signs BP 123/74 (BP Location: Left Arm)   Pulse 72   Temp 97.9 F (36.6 C)   Resp 18   SpO2 97%   Physical Exam Constitutional:      General: He is not in acute distress.    Appearance: Normal appearance. He is not ill-appearing, toxic-appearing or diaphoretic.  Cardiovascular:     Rate and Rhythm: Normal rate and regular rhythm.     Pulses: Normal pulses.  Pulmonary:     Effort: Pulmonary effort is normal.     Breath sounds: Normal breath sounds.  Musculoskeletal:        General: Normal range of motion.  Skin:    General: Skin is warm and dry.     Capillary Refill: Capillary refill takes less than 2 seconds.  Neurological:     General: No focal deficit present.     Mental Status: He is alert and oriented to person, place, and time.  Psychiatric:        Mood and Affect: Mood normal.        Behavior: Behavior normal.        Thought Content: Thought content normal.     Comments: Does not appear psychotic on my exam, is resting comfortably and is not responding to internal stimuli.    ED Results / Procedures / Treatments   Labs (all labs ordered are listed, but only abnormal results are displayed) Labs Reviewed  ETHANOL - Abnormal; Notable for the following components:      Result Value   Alcohol, Ethyl (B) 133 (*)    All other components within normal limits  ACETAMINOPHEN LEVEL - Abnormal; Notable for the following components:   Acetaminophen (Tylenol), Serum <10 (*)    All other components within normal limits  SALICYLATE LEVEL - Abnormal; Notable for the following components:   Salicylate Lvl <7.0 (*)    All other components within normal  limits  CBC WITH DIFFERENTIAL/PLATELET - Abnormal; Notable for the following components:   WBC 11.1 (*)    Lymphs Abs 4.7 (*)    Eosinophils Absolute 0.9 (*)    All other components within normal limits  RESP PANEL BY RT-PCR (FLU A&B, COVID) ARPGX2  COMPREHENSIVE METABOLIC PANEL  RAPID URINE DRUG SCREEN, HOSP PERFORMED  CBC WITH DIFFERENTIAL/PLATELET    EKG EKG Interpretation  Date/Time:  Wednesday June 14 2021 21:58:23 EDT Ventricular Rate:  86 PR Interval:  146 QRS Duration: 90 QT Interval:  350 QTC Calculation: 418 R Axis:   75 Text Interpretation: Normal sinus rhythm Normal ECG Confirmed by Kristine Royal [phone]) on 06/14/2021 11:10:04 PM  Radiology No results found.  Procedures Procedures   Medications Ordered in ED Medications - No data to display  ED Course  I have reviewed the triage vital signs and the nursing notes.  Pertinent labs & imaging results that were available during my care of the patient were reviewed by me and considered in my medical decision making (see chart for details).    MDM Rules/Calculators/A&P                           Patient presents to the emerge department today from [facility], needing medical clearance and requiring inpatient according to note.   Patient has been under IVC, has been calm and cooperative here not requiring any medical management.  First exam is been filled out by [facility].  Awaiting labs.   Patient has been medically cleared.  Patient needs inpatient behavioral health placement per psychiatry MD.  Patient will be under provider Default at this time, med rec has not yet been obtained.  Final Clinical Impression(s) / ED Diagnoses Final diagnoses:  Acute psychosis [facility])    Rx / DC Orders ED Discharge Orders     None        Farrel Gordon, PA-C 06/14/21 2321    Wynetta Fines, MD 06/17/21 0003

## 2021-06-14 NOTE — ED Notes (Addendum)
Sitter requested when pt got in ED, unfortunately no sitter available at this time.

## 2021-06-14 NOTE — ED Notes (Signed)
Discharge instructions provided and Pt stated understanding. Pt alert, orient and ambulatory prior to d/c from facility. Personal belongings returned from the blue locker. [employer] called to serve IVC papers and to provide transportation to [facility]. Attempted to call several times but kept getting a voicemail that was not set up yet. Spoke w/Zoe RN but she was unable to accept report. Will call again to see if anyone answers. Safety maintained.

## 2021-06-14 NOTE — Progress Notes (Signed)
Per chart review pt meets inpatient behavioral health placement per Bobbye Morton, MD. CSW was informed that there are no appropriate beds at [facility] therefore CSW referred out to the following facilities:   Destination Service Provider Address Phone Fax  [facility]  [address] [place] [address] [phone]  [facility]  [address] [place], [place] [address] [phone]  [facility]  [address] [place] [address] [phone]  [facility]  [address] [place] [address] [phone]  [facility]  [address] [place] [address] [phone]  [facility]  [address], [place] [address] [phone]  [facility]  [address] [place] [address] [phone]  [facility]  [address] [place] [address] [phone]  [facility]  [address], [place] [address] [phone]  [facility]  [address] [place], [place] [address] [phone]  [facility]  [address] [place] [address] [phone]  Maryjean Ka, MSW, [facility] 06/14/2021 9:10 PM
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
## 2021-06-14 NOTE — ED Triage Notes (Signed)
Escorted by [employer] from [facility]. Pt here for medical clearance. Pt was in [facility] intoxicated. Per [facility] nurse, pt was going from room to room and was very agitated. Pt meet inpatient criteria, no available room in [facility] at this time.

## 2021-06-15 MED ORDER — OLANZAPINE 10 MG PO TABS
10.0000 mg | ORAL_TABLET | Freq: Once | ORAL | Status: AC
  Administered 2021-06-15: 10 mg via ORAL
  Filled 2021-06-15: qty 1
  Filled 2021-06-15: qty 2

## 2021-06-15 NOTE — Progress Notes (Signed)
Patient information has been sent to [facility] via secure chat to review for potential admission. Patient meets inpatient criteria per Eliseo Gum, MD .   Situation ongoing, CSW will continue to monitor progress.    Signed:  Damita Dunnings, MSW, LCSW-A  06/15/2021 9:37 AM

## 2021-06-15 NOTE — ED Notes (Signed)
Pt lying on rt side, eyes closed, NAD. 1:1 behavioral sitter at bedside.

## 2021-06-15 NOTE — ED Provider Notes (Signed)
Emergency Medicine Observation Re-evaluation Note  [name] [name] is a 45 y.o. male, seen on rounds today.  Pt initially presented to the ED for complaints of Medical Clearance and Psychiatric Evaluation Currently, the patient is resting.  Physical Exam  BP 106/70 (BP Location: Left Arm)   Pulse 62   Temp 97.9 F (36.6 C)   Resp 15   SpO2 100%  Physical Exam General: Calm, cooperative Cardiac: Warm and well-perfused Lungs: Even and unlabored Psych: Calm  ED Course / MDM  EKG:EKG Interpretation  Date/Time:  Wednesday June 14 2021 21:58:23 EDT Ventricular Rate:  86 PR Interval:  146 QRS Duration: 90 QT Interval:  350 QTC Calculation: 418 R Axis:   75 Text Interpretation: Normal sinus rhythm Normal ECG Confirmed by Kristine Royal [id number]) on 06/14/2021 11:10:04 PM  I have reviewed the labs performed to date as well as medications administered while in observation.  Recent changes in the last 24 hours include psychiatry recommending inpatient psychiatric placement.  Plan  Current plan is for inpatient psych placement. Disposition per psychiatry MURDOCK JELLISON is under involuntary commitment.      Milagros Loll, MD 06/15/21 [phone]

## 2021-06-15 NOTE — ED Notes (Signed)
Pt responding to internal stimulus. Talking and laugh out loud. Pt refused blanket. Pt cooperative with staff and calm in the hallway at this time.

## 2021-06-15 NOTE — Progress Notes (Signed)
CSW informed by nursing staff that they are unable to give report on pt to receiving facility [facility]). CSW called facility at multiple phone numbers: [phone], [phone], [phone] which were unsuccessful. CSW was able to speak with a representative at [phone] and shared RN's information who was trying to give report which was Hale Bogus K. Roderic Scarce, RN [phone]. CSW was advised that the facility RN would call Alben Deeds, RN.  Via Secure chat CSW received update from Alben Deeds, RN that report was successful given to [facility] RN at [phone].   Maryjean Ka, MSW, [facility] 06/15/2021 4:16 PM

## 2021-06-15 NOTE — ED Notes (Signed)
Attempted to give report to [facility], no answer.

## 2021-06-15 NOTE — Progress Notes (Signed)
Pt accepted to [facility]    Patient meets inpatient criteria per Eliseo Gum, MD   Dr. Minna Merritts is the attending provider.    Call report to [phone]    Alben Deeds, RN @ [facility] ED notified.     Pt scheduled  to arrive at [facility] today by 1700.   Damita Dunnings, MSW, LCSW-A  1:10 PM 06/15/2021

## 2021-06-20 DIAGNOSIS — T426X1A Poisoning by other antiepileptic and sedative-hypnotic drugs, accidental (unintentional), initial encounter: Secondary | ICD-10-CM | POA: Insufficient documentation

## 2021-06-27 DIAGNOSIS — G8929 Other chronic pain: Secondary | ICD-10-CM | POA: Insufficient documentation

## 2021-06-27 DIAGNOSIS — M549 Dorsalgia, unspecified: Secondary | ICD-10-CM | POA: Insufficient documentation

## 2021-09-21 ENCOUNTER — Inpatient Hospital Stay (HOSPITAL_COMMUNITY)
Admission: AD | Admit: 2021-09-21 | Discharge: 2021-09-25 | DRG: 885 | Disposition: A | Discharge status: Home / Self Care | Payer: Medicare HMO | Source: Intra-hospital | Attending: Psychiatry | Admitting: Psychiatry

## 2021-09-21 ENCOUNTER — Encounter (HOSPITAL_COMMUNITY): Payer: Self-pay

## 2021-09-21 ENCOUNTER — Encounter (HOSPITAL_COMMUNITY): Payer: Self-pay | Admitting: Psychiatry

## 2021-09-21 ENCOUNTER — Emergency Department (HOSPITAL_COMMUNITY)
Admission: EM | Admit: 2021-09-21 | Discharge: 2021-09-21 | Disposition: A | Discharge status: Other Acute Inpatient Hospital | Payer: Medicare HMO | Attending: Emergency Medicine | Admitting: Emergency Medicine

## 2021-09-21 ENCOUNTER — Other Ambulatory Visit: Payer: Self-pay

## 2021-09-21 DIAGNOSIS — B1920 Unspecified viral hepatitis C without hepatic coma: Secondary | ICD-10-CM | POA: Diagnosis present

## 2021-09-21 DIAGNOSIS — F411 Generalized anxiety disorder: Secondary | ICD-10-CM | POA: Diagnosis present

## 2021-09-21 DIAGNOSIS — F17200 Nicotine dependence, unspecified, uncomplicated: Secondary | ICD-10-CM | POA: Diagnosis present

## 2021-09-21 DIAGNOSIS — F1010 Alcohol abuse, uncomplicated: Secondary | ICD-10-CM

## 2021-09-21 DIAGNOSIS — F4310 Post-traumatic stress disorder, unspecified: Secondary | ICD-10-CM | POA: Diagnosis present

## 2021-09-21 DIAGNOSIS — R45851 Suicidal ideations: Secondary | ICD-10-CM | POA: Diagnosis not present

## 2021-09-21 DIAGNOSIS — F1024 Alcohol dependence with alcohol-induced mood disorder: Secondary | ICD-10-CM | POA: Diagnosis present

## 2021-09-21 DIAGNOSIS — R441 Visual hallucinations: Secondary | ICD-10-CM | POA: Diagnosis present

## 2021-09-21 DIAGNOSIS — F319 Bipolar disorder, unspecified: Secondary | ICD-10-CM | POA: Diagnosis present

## 2021-09-21 DIAGNOSIS — F332 Major depressive disorder, recurrent severe without psychotic features: Secondary | ICD-10-CM | POA: Insufficient documentation

## 2021-09-21 DIAGNOSIS — F1094 Alcohol use, unspecified with alcohol-induced mood disorder: Secondary | ICD-10-CM | POA: Diagnosis present

## 2021-09-21 DIAGNOSIS — Z811 Family history of alcohol abuse and dependence: Secondary | ICD-10-CM | POA: Diagnosis not present

## 2021-09-21 DIAGNOSIS — R000 Tachycardia, unspecified: Secondary | ICD-10-CM | POA: Insufficient documentation

## 2021-09-21 DIAGNOSIS — Y902 Blood alcohol level of 40-59 mg/100 ml: Secondary | ICD-10-CM | POA: Diagnosis not present

## 2021-09-21 DIAGNOSIS — Z79899 Other long term (current) drug therapy: Secondary | ICD-10-CM | POA: Diagnosis not present

## 2021-09-21 DIAGNOSIS — Z880 Allergy status to penicillin: Secondary | ICD-10-CM | POA: Diagnosis not present

## 2021-09-21 DIAGNOSIS — F17210 Nicotine dependence, cigarettes, uncomplicated: Secondary | ICD-10-CM | POA: Insufficient documentation

## 2021-09-21 DIAGNOSIS — Z72811 Adult antisocial behavior: Secondary | ICD-10-CM | POA: Diagnosis not present

## 2021-09-21 DIAGNOSIS — G4700 Insomnia, unspecified: Secondary | ICD-10-CM | POA: Diagnosis present

## 2021-09-21 DIAGNOSIS — F10932 Alcohol use, unspecified with withdrawal with perceptual disturbance: Secondary | ICD-10-CM | POA: Diagnosis present

## 2021-09-21 DIAGNOSIS — F10939 Alcohol use, unspecified with withdrawal, unspecified: Secondary | ICD-10-CM | POA: Diagnosis present

## 2021-09-21 DIAGNOSIS — F10239 Alcohol dependence with withdrawal, unspecified: Secondary | ICD-10-CM | POA: Diagnosis present

## 2021-09-21 DIAGNOSIS — Z20822 Contact with and (suspected) exposure to covid-19: Secondary | ICD-10-CM | POA: Insufficient documentation

## 2021-09-21 DIAGNOSIS — Z888 Allergy status to other drugs, medicaments and biological substances status: Secondary | ICD-10-CM | POA: Diagnosis not present

## 2021-09-21 LAB — RESP PANEL BY RT-PCR (FLU A&B, COVID) ARPGX2
Influenza A by PCR: NEGATIVE
Influenza B by PCR: NEGATIVE
SARS Coronavirus 2 by RT PCR: NEGATIVE

## 2021-09-21 LAB — RAPID URINE DRUG SCREEN, HOSP PERFORMED
Amphetamines: NOT DETECTED
Barbiturates: NOT DETECTED
Benzodiazepines: NOT DETECTED
Cocaine: NOT DETECTED
Opiates: NOT DETECTED
Tetrahydrocannabinol: NOT DETECTED

## 2021-09-21 LAB — COMPREHENSIVE METABOLIC PANEL
ALT: 52 U/L — ABNORMAL HIGH (ref 0–44)
AST: 84 U/L — ABNORMAL HIGH (ref 15–41)
Albumin: 4.7 g/dL (ref 3.5–5.0)
Alkaline Phosphatase: 76 U/L (ref 38–126)
Anion gap: 12 (ref 5–15)
BUN: 13 mg/dL (ref 6–20)
CO2: 24 mmol/L (ref 22–32)
Calcium: 8.6 mg/dL — ABNORMAL LOW (ref 8.9–10.3)
Chloride: 103 mmol/L (ref 98–111)
Creatinine, Ser: 0.64 mg/dL (ref 0.61–1.24)
GFR, Estimated: >60 mL/min (ref >60)
Glucose, Bld: 89 mg/dL (ref 70–99)
Potassium: 3.2 mmol/L — ABNORMAL LOW (ref 3.5–5.1)
Sodium: 139 mmol/L (ref 135–145)
Total Bilirubin: 0.9 mg/dL (ref 0.3–1.2)
Total Protein: 7.6 g/dL (ref 6.5–8.1)

## 2021-09-21 LAB — CBC WITH DIFFERENTIAL/PLATELET
Abs Immature Granulocytes: 0.06 10*3/uL (ref 0.00–0.07)
Basophils Absolute: 0.1 10*3/uL (ref 0.0–0.1)
Basophils Relative: 0 %
Eosinophils Absolute: 0.2 10*3/uL (ref 0.0–0.5)
Eosinophils Relative: 1 %
HCT: 42.4 % (ref 39.0–52.0)
Hemoglobin: 14.3 g/dL (ref 13.0–17.0)
Immature Granulocytes: 0 %
Lymphocytes Relative: 19 %
Lymphs Abs: 2.9 10*3/uL (ref 0.7–4.0)
MCH: 30.9 pg (ref 26.0–34.0)
MCHC: 33.7 g/dL (ref 30.0–36.0)
MCV: 91.6 fL (ref 80.0–100.0)
Monocytes Absolute: 1.4 10*3/uL — ABNORMAL HIGH (ref 0.1–1.0)
Monocytes Relative: 9 %
Neutro Abs: 10.7 10*3/uL — ABNORMAL HIGH (ref 1.7–7.7)
Neutrophils Relative %: 71 %
Platelets: 346 10*3/uL (ref 150–400)
RBC: 4.63 MIL/uL (ref 4.22–5.81)
RDW: 13 % (ref 11.5–15.5)
WBC: 15.2 10*3/uL — ABNORMAL HIGH (ref 4.0–10.5)
nRBC: 0 % (ref 0.0–0.2)

## 2021-09-21 LAB — ETHANOL: Alcohol, Ethyl (B): 46 mg/dL — ABNORMAL HIGH (ref <10)

## 2021-09-21 MED ORDER — THIAMINE HCL 100 MG PO TABS
100.0000 mg | ORAL_TABLET | Freq: Every day | ORAL | Status: DC
  Administered 2021-09-22: 100 mg via ORAL
  Filled 2021-09-21 (×4): qty 1

## 2021-09-21 MED ORDER — NICOTINE 21 MG/24HR TD PT24
21.0000 mg | MEDICATED_PATCH | Freq: Every day | TRANSDERMAL | Status: DC
  Administered 2021-09-21: 21 mg via TRANSDERMAL
  Filled 2021-09-21: qty 1

## 2021-09-21 MED ORDER — LORAZEPAM 1 MG PO TABS: 0.0000 mg | ORAL_TABLET | Freq: Two times a day (BID) | ORAL | Status: DC

## 2021-09-21 MED ORDER — LOPERAMIDE HCL 2 MG PO CAPS: 2.0000 mg | ORAL_CAPSULE | ORAL | Status: AC | PRN

## 2021-09-21 MED ORDER — LORAZEPAM 1 MG PO TABS
1.0000 mg | ORAL_TABLET | Freq: Four times a day (QID) | ORAL | Status: DC
  Administered 2021-09-21 – 2021-09-22 (×4): 1 mg via ORAL
  Filled 2021-09-21 (×4): qty 1

## 2021-09-21 MED ORDER — NICOTINE 21 MG/24HR TD PT24
21.0000 mg | MEDICATED_PATCH | Freq: Every day | TRANSDERMAL | Status: DC
  Administered 2021-09-22 – 2021-09-25 (×4): 21 mg via TRANSDERMAL
  Filled 2021-09-21 (×6): qty 1

## 2021-09-21 MED ORDER — LORAZEPAM 1 MG PO TABS
1.0000 mg | ORAL_TABLET | Freq: Four times a day (QID) | ORAL | Status: DC | PRN
  Administered 2021-09-22: 1 mg via ORAL
  Filled 2021-09-21: qty 1

## 2021-09-21 MED ORDER — ADULT MULTIVITAMIN W/MINERALS CH
1.0000 | ORAL_TABLET | Freq: Every day | ORAL | Status: DC
  Administered 2021-09-21 – 2021-09-25 (×5): 1 via ORAL
  Filled 2021-09-21 (×9): qty 1

## 2021-09-21 MED ORDER — LORAZEPAM 1 MG PO TABS: 1.0000 mg | ORAL_TABLET | Freq: Three times a day (TID) | ORAL | Status: DC

## 2021-09-21 MED ORDER — TRAZODONE HCL 50 MG PO TABS
50.0000 mg | ORAL_TABLET | Freq: Every evening | ORAL | Status: DC | PRN
  Administered 2021-09-21 – 2021-09-24 (×3): 50 mg via ORAL
  Filled 2021-09-21 (×4): qty 1

## 2021-09-21 MED ORDER — ONDANSETRON 4 MG PO TBDP
4.0000 mg | ORAL_TABLET | Freq: Four times a day (QID) | ORAL | Status: AC | PRN
  Administered 2021-09-21 – 2021-09-23 (×5): 4 mg via ORAL
  Filled 2021-09-21 (×5): qty 1

## 2021-09-21 MED ORDER — ALUM & MAG HYDROXIDE-SIMETH 200-200-20 MG/5ML PO SUSP: 30.0000 mL | ORAL | Status: DC | PRN

## 2021-09-21 MED ORDER — THIAMINE HCL 100 MG PO TABS
100.0000 mg | ORAL_TABLET | Freq: Every day | ORAL | Status: DC
  Administered 2021-09-21: 100 mg via ORAL
  Filled 2021-09-21: qty 1

## 2021-09-21 MED ORDER — LORAZEPAM 2 MG/ML IJ SOLN: 0.0000 mg | Freq: Four times a day (QID) | INTRAMUSCULAR | Status: DC

## 2021-09-21 MED ORDER — MAGNESIUM HYDROXIDE 400 MG/5ML PO SUSP
30.0000 mL | Freq: Every day | ORAL | Status: DC | PRN
  Administered 2021-09-24: 21:00:00 30 mL via ORAL
  Filled 2021-09-21: qty 30

## 2021-09-21 MED ORDER — LORAZEPAM 1 MG PO TABS
0.0000 mg | ORAL_TABLET | Freq: Four times a day (QID) | ORAL | Status: DC
  Administered 2021-09-21 (×2): 2 mg via ORAL
  Filled 2021-09-21 (×2): qty 3

## 2021-09-21 MED ORDER — LORAZEPAM 2 MG/ML IJ SOLN: 0.0000 mg | Freq: Two times a day (BID) | INTRAMUSCULAR | Status: DC

## 2021-09-21 MED ORDER — LORAZEPAM 1 MG PO TABS: 1.0000 mg | ORAL_TABLET | Freq: Every day | ORAL | Status: DC

## 2021-09-21 MED ORDER — HYDROXYZINE HCL 25 MG PO TABS
25.0000 mg | ORAL_TABLET | Freq: Three times a day (TID) | ORAL | Status: DC | PRN
  Administered 2021-09-21 – 2021-09-24 (×7): 25 mg via ORAL
  Filled 2021-09-21 (×7): qty 1

## 2021-09-21 MED ORDER — ACETAMINOPHEN 325 MG PO TABS
650.0000 mg | ORAL_TABLET | Freq: Four times a day (QID) | ORAL | Status: DC | PRN
  Administered 2021-09-21 – 2021-09-24 (×5): 650 mg via ORAL
  Filled 2021-09-21 (×5): qty 2

## 2021-09-21 MED ORDER — LORAZEPAM 1 MG PO TABS: 1.0000 mg | ORAL_TABLET | Freq: Two times a day (BID) | ORAL | Status: DC

## 2021-09-21 MED ORDER — THIAMINE HCL 100 MG/ML IJ SOLN: 100.0000 mg | Freq: Every day | INTRAMUSCULAR | Status: DC

## 2021-09-21 NOTE — ED Notes (Signed)
Report given to Luc[place], [facility] RN

## 2021-09-21 NOTE — ED Notes (Signed)
Pt has one belonging bag and it was placed in the cabinets of 5-8.

## 2021-09-21 NOTE — Tx Team (Signed)
Initial Treatment Plan 09/21/2021 6:28 PM [name] [name] [id number]    PATIENT STRESSORS: Financial difficulties   Occupational concerns   Substance abuse   Other: homeless     PATIENT STRENGTHS: Ability for insight    PATIENT IDENTIFIED PROBLEMS: ETOH abuse  Homeless   financial  SI  CAH             DISCHARGE CRITERIA:  Ability to meet basic life and health needs Adequate post-discharge living arrangements Improved stabilization in mood, thinking, and/or behavior Medical problems require only outpatient monitoring Motivation to continue treatment in a less acute level of care Need for constant or close observation no longer present Reduction of life-threatening or endangering symptoms to within safe limits Safe-care adequate arrangements made Verbal commitment to aftercare and medication compliance Withdrawal symptoms are absent or subacute and managed without 24-hour nursing intervention  PRELIMINARY DISCHARGE PLAN: Attend aftercare/continuing care group Attend PHP/IOP Attend 12-step recovery group Outpatient therapy Placement in alternative living arrangements  PATIENT/FAMILY INVOLVEMENT: This treatment plan has been presented to and reviewed with the patient, [name] [name].  The patient has been given the opportunity to ask questions and make suggestions.  [name] Heath, RN 09/21/2021, 6:28 PM

## 2021-09-21 NOTE — ED Notes (Signed)
Attempted to call report to [facility]. Advised [facility] RN will call back for report when she's available.

## 2021-09-21 NOTE — ED Triage Notes (Signed)
PT states for the last month he has been having suicidal thoughts with a plan of shooting himself. Pt states he was playing russian roulette this morning with his gun.

## 2021-09-21 NOTE — Progress Notes (Signed)
   09/21/21 1715  Psych Admission Type (Psych Patients Only)  Admission Status Voluntary  Psychosocial Assessment  Patient Complaints Agitation;Anger;Depression;Disorientation;Hopelessness;Irritability;Insomnia;Loneliness;Nervousness;Shakiness  Eye Contact Avoids  Facial Expression Pained;Flat  Affect Anxious;Depressed  Speech Soft;Slow;Slurred  Interaction Evasive;Guarded  Motor Activity Fidgety  Appearance/Hygiene Body odor;Disheveled  Behavior Characteristics Cooperative;Anxious;Fidgety  Mood Depressed;Anxious  Thought Process  Coherency Disorganized  Content Blaming self  Delusions WDL  Perception WDL  Hallucination Auditory;Command  Judgment Poor  Confusion Mild  Danger to Others  Danger to Others None reported or observed   Initial Nursing Assessment  D: Pt. Is a 45 y.o. Caucasian male voluntarily admitted from [facility]. Pt. Pt. Reported that he had increasing SI thoughts with a plan to shoot himself with his friends gun. Pt. Reported that he drinks 3 bottles of wine daily. Pt. Reports that he hears voices telling himself to kill himself. At present pt is walking around muttering and  talking to himself.       Pt. Has a rash  of raised pink pimples on his chest and scars on each shoulder from rotator cuff surgery. Pt. Is Hep C positive. Pt. Reported that he has a history of seizures when he is "detoxing" Pt. Has a high severity allergy reaction, anaphylaxis to lithium and a low allergey reaction to penicillins. Pt. Reported that zoloft and gabapentin has helped him in the past and zyprexa  worked for his "voices"the patient. A:  Patient took scheduled medicine.  Support and encouragement provided Routine safety checks conducted every 15 minutes. Patient  Informed to notify staff with any concerns.   R: Pt. Contracts for safety. Safety maintained.

## 2021-09-21 NOTE — ED Provider Notes (Signed)
[facility]-EMERGENCY DEPT Provider Note   CSN: [id number] Arrival date & time: 09/21/21  8938     History Chief Complaint  Patient presents with   Suicidal    [name] [name] is a 45 y.o. male.  Patient is a 45 year old male with a history of polysubstance use, depression, bipolar disease, hepatitis C who is presenting today with complaints of suicidal ideation.  He reports that he has been so depressed lately he just does not want a live anymore.  He has access to a gun and states that he was playing [place] this morning but he really just wants to shoot himself in the head.  He has overdosed in the past by overdrink dosing on pills but states he can do it better if he just shoot himself.  He reports things are just not getting better, he is homeless, he is currently off all medications, and reports he just feels hopeless.  He has been drinking but denies drug use.  Last drink was this morning.  He reports drinking a bottle of wine.  He has not had anything to eat in the last few days because he said he has been drinking and he really just did not feel hungry.  He denies any chest pain or shortness of breath.  Patient was last evaluated in August and at that time went to [facility].  Since his discharge there things have just gotten worse.  The history is provided by the patient and medical records.      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   ETOH abuse    Cirrhosis (HCC) 05/24/2015   Suicidal ideation    Recurrent cellulitis of lower leg  11/09/2014   Venous (peripheral) insufficiency 11/09/2014   Bipolar affective disorder (HCC)    Alcohol dependence with alcohol-induced mood disorder (HCC)    Opioid dependence with opioid-induced mood disorder (HCC)    MDD (major depressive disorder) 09/09/2014   Suicidal ideations    Chronic pain syndrome 05/31/2014   Tobacco dependence 05/30/2014   Poor dentition 02/03/2014   Hepatitis C virus infection without hepatic coma 02/03/2014   Sepsis (HCC) 01/29/2014   Anxiety state, unspecified 01/31/2013   Mood disorder in conditions classified elsewhere 01/31/2013   Polysubstance (including opioids) dependence with physiological dependence (HCC) 01/27/2013    Class: Acute   Alcohol withdrawal (HCC) 01/27/2013    Class: Acute    Past Surgical History:  Procedure Laterality Date   BACK SURGERY     ROTATOR CUFF REPAIR     SHOULDER SURGERY         Family History  Problem Relation Age of Onset   Alcohol abuse Father    Cancer Maternal Aunt     Social History   Tobacco Use   Smoking status: Every Day    Packs/day: 1.00    Years: 31.00    Pack years: 31.00    Types: Cigarettes    Start date: 10/22/1981   Smokeless tobacco: Never  Vaping Use   Vaping Use: Never used  Substance Use Topics   Alcohol use: Not Currently    Comment: 3 bottles of wine per day   Drug use: Not Currently    Types: Marijuana, Benzodiazepines, Other-see comments, Methamphetamines, Cocaine    Comment: denies    Home Medications Prior to Admission medications   Medication Sig Start Date End Date Taking? Authorizing Provider  doxepin (SINEQUAN) 25 MG capsule Take 1 capsule (25 mg total) by mouth at bedtime. Patient not taking: No sig reported 04/09/21   Antonieta Pert, MD  furosemide (LASIX) 20 MG tablet Take 1 tablet (20 mg total) by mouth 2 (two) times daily. Patient not taking: No sig reported 04/09/21   Antonieta Pert, MD  gabapentin (NEURONTIN) 400 MG capsule Take 2 capsules (800 mg  total) by mouth 3 (three) times daily. Patient not taking: No sig reported 04/09/21   Antonieta Pert, MD  metoprolol tartrate (LOPRESSOR) 50 MG tablet Take 1 tablet (50 mg total) by mouth 2 (two) times daily. Patient not taking: Reported on 06/15/2021 04/09/21   Antonieta Pert, MD  QUEtiapine (SEROQUEL) 100 MG tablet Take 1 tablet (100 mg total) by mouth at bedtime. Patient not taking: Reported on 06/15/2021 04/09/21   Antonieta Pert, MD  sertraline (ZOLOFT) 100 MG tablet Take 1 tablet (100 mg total) by mouth daily. Patient not taking: Reported on 06/15/2021 04/10/21   Antonieta Pert, MD    Allergies    Lithium and Penicillins  Review of Systems   Review of Systems  All other systems reviewed and are negative.  Physical Exam Updated Vital Signs BP (!) 158/84 (BP Location: Left Arm)   Pulse (!) 109   Temp 97.6 F (36.4 C) (Oral)   Resp 16   Ht 5\' 9"  (1.753 m)   Wt 74.8 kg   SpO2 99%   BMI 24.35 kg/m   Physical Exam Vitals and nursing note reviewed.  Constitutional:      General: He is not in acute distress.    Appearance: He is well-developed.     Comments: Disheveled  HENT:     Head: Normocephalic and atraumatic.  Eyes:     Conjunctiva/sclera: Conjunctivae normal.     Pupils: Pupils are equal, round, and reactive to light.  Cardiovascular:     Rate and Rhythm: Regular rhythm. Tachycardia present.     Heart sounds: No murmur heard. Pulmonary:     Effort: Pulmonary effort is normal. No respiratory distress.     Breath sounds: Normal breath sounds. No wheezing or rales.  Abdominal:     General: There is no distension.     Palpations: Abdomen is soft.     Tenderness: There is no abdominal tenderness. There is no guarding or rebound.  Musculoskeletal:        General: No tenderness. Normal range of motion.     Cervical back: Normal range of motion and neck supple.  Skin:    General: Skin is warm and dry.     Findings: No erythema or rash.  Neurological:      Mental Status: He is alert and oriented to person, place, and time. Mental status is at baseline.     Sensory: No sensory deficit.     Motor: No weakness.     Comments: Slightly shaky  Psychiatric:        Attention and Perception: Attention normal. He does not perceive auditory or visual hallucinations.        Mood and Affect: Mood is depressed.  Affect is flat.        Speech: Speech normal.        Thought Content: Thought content includes suicidal ideation. Thought content includes suicidal plan.     Comments: Poor eye contact    ED Results / Procedures / Treatments   Labs (all labs ordered are listed, but only abnormal results are displayed) Labs Reviewed  CBC WITH DIFFERENTIAL/PLATELET - Abnormal; Notable for the following components:      Result Value   WBC 15.2 (*)    Neutro Abs 10.7 (*)    Monocytes Absolute 1.4 (*)    All other components within normal limits  COMPREHENSIVE METABOLIC PANEL - Abnormal; Notable for the following components:   Potassium 3.2 (*)    Calcium 8.6 (*)    AST 84 (*)    ALT 52 (*)    All other components within normal limits  ETHANOL - Abnormal; Notable for the following components:   Alcohol, Ethyl (B) 46 (*)    All other components within normal limits  RESP PANEL BY RT-PCR (FLU A&B, COVID) ARPGX2  RAPID URINE DRUG SCREEN, HOSP PERFORMED    EKG None  Radiology No results found.  Procedures Procedures   Medications Ordered in ED Medications  LORazepam (ATIVAN) injection 0-4 mg ( Intravenous See Alternative 09/21/21 1030)    Or  LORazepam (ATIVAN) tablet 0-4 mg (2 mg Oral Given 09/21/21 1030)  LORazepam (ATIVAN) injection 0-4 mg (has no administration in time range)    Or  LORazepam (ATIVAN) tablet 0-4 mg (has no administration in time range)  thiamine tablet 100 mg (100 mg Oral Given 09/21/21 1031)    Or  thiamine (B-1) injection 100 mg ( Intravenous See Alternative 09/21/21 1031)  nicotine (NICODERM CQ - dosed in mg/24 hours) patch  21 mg (21 mg Transdermal Patch Applied 09/21/21 1030)    ED Course  I have reviewed the triage vital signs and the nursing notes.  Pertinent labs & imaging results that were available during my care of the patient were reviewed by me and considered in my medical decision making (see chart for details).    MDM Rules/Calculators/A&P                           Patient presenting today with mental health history complaining of being depressed and suicidal.  Patient reports he is going to shoot himself with a gun.  He has been drinking alcohol but denies any drug use.  Currently he is awake and alert and does not seem severely intoxicated at this time.  Labs are pending and TTS to evaluate.  If patient attempts to leave he will be made IVC.  11:26 AM Alcohol level is only 46.  Patient has chronic leukocytosis of unknown significance.  Hemoglobin is normal.  Renal function is within normal limits and mild elevation of LFTs most likely from recent alcohol binge.  Patient is medically clear for psych evaluation.  MDM   Amount and/or Complexity of Data Reviewed Clinical lab tests: ordered and reviewed Tests in the radiology section of CPT: ordered and reviewed Tests in the medicine section of CPT: ordered and reviewed Independent visualization of images, tracings, or specimens: yes     Final Clinical Impression(s) / ED Diagnoses Final diagnoses:  None    Rx / DC Orders ED Discharge Orders     None        Gwyneth Sprout, MD 09/21/21 1127
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
## 2021-09-21 NOTE — BHH Group Notes (Signed)
[facility] Group Notes:  (Nursing/MHT/Case Management/Adjunct)  Date:  09/21/2021  Time:  8:55 PM  Type of Therapy:  Psychoeducational Skills  Participation Level:  Did Not Attend  Participation Quality:   DNA  Affect:   DNA  Cognitive:   DNA  Insight:  None  Engagement in Group:  None  Modes of Intervention:   DNA  Summary of Progress/Problems:  Adelina Mings 09/21/2021, 8:55 PM

## 2021-09-21 NOTE — BH Assessment (Signed)
[facility] Assessment Progress Note   Per Elta Guadeloupe, PMHNP, this pt requires psychiatric hospitalization at this time.  Danika, RN, [facility] has assigned pt to [facility] Rm [id number] to the service of Dr Sherron Flemings.  Pt has signed Voluntary Admission and Consent for Treatment, as well as Consent to Release Information to no one, and signed forms have been faxed to [facility].  EDP Gwyneth Sprout, MD and pt's nurse, Abby, have been notified, and Abby  agrees to send original paperwork along with pt via [employer], and to call report to [phone].  Doylene Canning, [place] [facility] Coordinator [phone]

## 2021-09-22 ENCOUNTER — Encounter (HOSPITAL_COMMUNITY): Payer: Self-pay

## 2021-09-22 LAB — HEMOGLOBIN A1C
Hgb A1c MFr Bld: 5.4 % (ref 4.8–5.6)
Mean Plasma Glucose: 108.28 mg/dL

## 2021-09-22 LAB — LIPID PANEL
Cholesterol: 143 mg/dL (ref 0–200)
HDL: 66 mg/dL (ref >40)
LDL Cholesterol: 66 mg/dL (ref 0–99)
Total CHOL/HDL Ratio: 2.2 RATIO
Triglycerides: 54 mg/dL (ref <150)
VLDL: 11 mg/dL (ref 0–40)

## 2021-09-22 LAB — TSH: TSH: 2.873 u[IU]/mL (ref 0.350–4.500)

## 2021-09-22 MED ORDER — SERTRALINE HCL 50 MG PO TABS
50.0000 mg | ORAL_TABLET | Freq: Every day | ORAL | Status: DC
  Administered 2021-09-24 – 2021-09-25 (×2): 50 mg via ORAL
  Filled 2021-09-22 (×4): qty 1

## 2021-09-22 MED ORDER — LORAZEPAM 1 MG PO TABS: 1.0000 mg | ORAL_TABLET | ORAL | Status: DC | PRN

## 2021-09-22 MED ORDER — CLONIDINE HCL 0.1 MG PO TABS
0.1000 mg | ORAL_TABLET | Freq: Two times a day (BID) | ORAL | Status: DC
  Administered 2021-09-22 – 2021-09-25 (×6): 0.1 mg via ORAL
  Filled 2021-09-22 (×11): qty 1

## 2021-09-22 MED ORDER — LORAZEPAM 1 MG PO TABS: 2.0000 mg | ORAL_TABLET | ORAL | Status: DC | PRN

## 2021-09-22 MED ORDER — DIAZEPAM 5 MG PO TABS
5.0000 mg | ORAL_TABLET | Freq: Once | ORAL | Status: DC | PRN
  Filled 2021-09-22: qty 1

## 2021-09-22 MED ORDER — LORAZEPAM 1 MG PO TABS: 4.0000 mg | ORAL_TABLET | ORAL | Status: DC | PRN

## 2021-09-22 MED ORDER — CHLORDIAZEPOXIDE HCL 25 MG PO CAPS
50.0000 mg | ORAL_CAPSULE | Freq: Once | ORAL | Status: AC
  Administered 2021-09-22: 50 mg via ORAL
  Filled 2021-09-22: qty 2

## 2021-09-22 MED ORDER — LORAZEPAM 1 MG PO TABS
3.0000 mg | ORAL_TABLET | ORAL | Status: DC | PRN
  Filled 2021-09-22: qty 3

## 2021-09-22 MED ORDER — LORAZEPAM 1 MG PO TABS
1.0000 mg | ORAL_TABLET | ORAL | Status: DC | PRN
  Administered 2021-09-22 – 2021-09-24 (×7): 1 mg via ORAL
  Filled 2021-09-22 (×7): qty 1

## 2021-09-22 MED ORDER — SERTRALINE HCL 25 MG PO TABS
25.0000 mg | ORAL_TABLET | Freq: Every day | ORAL | Status: AC
  Administered 2021-09-23: 25 mg via ORAL
  Filled 2021-09-22 (×2): qty 1

## 2021-09-22 MED ORDER — THIAMINE HCL 100 MG PO TABS
100.0000 mg | ORAL_TABLET | Freq: Every day | ORAL | Status: DC
  Administered 2021-09-23 – 2021-09-25 (×3): 100 mg via ORAL
  Filled 2021-09-22 (×5): qty 1

## 2021-09-22 MED ORDER — CHLORDIAZEPOXIDE HCL 25 MG PO CAPS: 25.0000 mg | ORAL_CAPSULE | Freq: Every day | ORAL | Status: DC

## 2021-09-22 MED ORDER — BOOST / RESOURCE BREEZE PO LIQD CUSTOM
237.0000 mL | Freq: Two times a day (BID) | ORAL | Status: DC
  Administered 2021-09-22 – 2021-09-23 (×3): 1 via ORAL
  Administered 2021-09-24: 16:00:00 237 mL via ORAL
  Administered 2021-09-25: 1 via ORAL
  Filled 2021-09-22 (×11): qty 1

## 2021-09-22 MED ORDER — LORAZEPAM 2 MG/ML IJ SOLN: 2.0000 mg | Freq: Two times a day (BID) | INTRAMUSCULAR | Status: DC | PRN

## 2021-09-22 MED ORDER — DIAZEPAM 5 MG PO TABS
5.0000 mg | ORAL_TABLET | Freq: Once | ORAL | Status: AC | PRN
  Administered 2021-09-22: 5 mg via ORAL
  Filled 2021-09-22: qty 1

## 2021-09-22 MED ORDER — CHLORDIAZEPOXIDE HCL 25 MG PO CAPS
25.0000 mg | ORAL_CAPSULE | Freq: Three times a day (TID) | ORAL | Status: AC
  Administered 2021-09-24 (×3): 25 mg via ORAL
  Filled 2021-09-22 (×3): qty 1

## 2021-09-22 MED ORDER — CHLORDIAZEPOXIDE HCL 25 MG PO CAPS
25.0000 mg | ORAL_CAPSULE | Freq: Four times a day (QID) | ORAL | Status: AC
  Administered 2021-09-22 – 2021-09-24 (×6): 25 mg via ORAL
  Filled 2021-09-22 (×6): qty 1

## 2021-09-22 MED ORDER — CHLORDIAZEPOXIDE HCL 25 MG PO CAPS
25.0000 mg | ORAL_CAPSULE | ORAL | Status: DC
  Administered 2021-09-25: 25 mg via ORAL
  Filled 2021-09-22: qty 1

## 2021-09-22 NOTE — Group Note (Signed)
LCSW Group Therapy Note   Group Date: 09/22/2021 Start Time: 1300 End Time: 1400  Type of Therapy:  Group Therapy: Gratitude   Participation Level:  None  Summary of Progress/Problems: Pt received the packet for group. Pt came to the group late and did not participate.   [name], LCSWA 09/22/2021  2:02 PM

## 2021-09-22 NOTE — H&P (Signed)
Psychiatric Admission Assessment Adult  Patient Identification: [name] [name] MRN:  [id number] Date of Evaluation:  09/22/2021 Chief Complaint:  MDD (major depressive disorder), recurrent severe, without psychosis ([place]) [F33.2] Principal Diagnosis: MDD (major depressive disorder), recurrent severe, without psychosis ([place]) Diagnosis:  Principal Problem:   MDD (major depressive disorder), recurrent severe, without psychosis ([place])  History of Present Illness:   Patient is a 45 year old male with a past psychiatric history of major depressive disorder, anxiety disorder, PTSD, and alcohol use disorder, who was admitted to the psychiatric unit for evaluation and treatment of worsening depression and suicidal thoughts with plan.  Patient case was discussed during interdisciplinary team meeting this morning.  On initial evaluation today, the patient reports that there are multiple stressors that are contributing to his worsening mood and suicidal thoughts including: Recently separating from his wife, financial stress, losing a job about 1 month ago.  He reports profound depression and anhedonia for the last 10 years.  He reports his sleep is poor, with initial insomnia, chronic.  Reports low energy.  Reports difficulty concentrating.  Reports appetite is up and down and currently nauseous.  Reports having suicidal thoughts, passive, without intent or plan.  Denies having homicidal thoughts at this time.  He reports having homicidal thoughts about 1 week ago, to harm his neighbor, but he confirms multiple times that he no longer has these thoughts to harm his neighbor at this time.  Patient reports having an elevated level of anxiety, that is chronic.  He denies having panic attacks. He denies having auditory hallucinations.  Reports having visual hallucinations of rats running across the floor of the room.  He reports having tactile hallucinations of bugs crawling on his skin.  Denies having paranoid  thoughts, delusional thought construct, thought insertion or withdrawal, or ideas of reference. He denies having symptoms meeting criteria at this time or the past for hypomanic or manic episode. He reports history of being in the [employer], [place], active duty in [place] from 2012 2017.  He reports being honorably discharged.  He reports intrusive memories, flashbacks, nightmares.  He reports avoidance symptoms.  He reports negative alteration in cognition and mood.  He reports hypervigilance.  Past psychiatric history: The patient reports history of being diagnosed with major depressive disorder, anxiety disorder, PTSD.  He denies having a history of being diagnosed with bipolar disorder in the past.  Patient reports he was hospitalized psychiatrically about 5 times during his lifetime, most recently 1 year ago for suicidal thoughts.  He reports a history of suicide attempt, by overdose.  He reports he is not currently taking any psychiatric medications.  He reports in the past, he was prescribed Zoloft, unknown dose, for depression and PTSD, which was effective.  He reports being prescribed gabapentin in the past, unknown dose, for mood stability and pain, which was effective.  He reports being prescribed Zyprexa in the past, unknown dose, "it helped to clear my thoughts".  Past medical history: Patient reports history of alcohol withdrawal seizure and history of delirium tremens.  He reports a long history of alcoholism.  He reports only having seizures due to alcohol withdrawal denies having seizures that were not provoked by alcohol withdrawal.  He reports having surgeries: Rotator cuff x2.  Allergies: Lithium caused rash.  Substance use history: Patient reports drinking three 1/2 gallon of vodka per day, last drink yesterday morning.  He reports smoking cigarettes 1 pack/day since age 81.  Denies other illicit drug use.  Social history: The  patient reports she was born and raised in [place],  moved to [place] about 17 years ago with his wife and for work.  He reports he is separated from wife.  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  Reports having 2 children, both girls.  Family history: Patient denies having family psychiatric history.  Patient denies having family history of suicide attempt.     Total Time spent with patient: 45 minutes    Is the patient at risk to self? Yes.    Has the patient been a risk to self in the past 6 months? Yes.    Has the patient been a risk to self within the distant past? Yes.    Is the patient a risk to others? No.  Has the patient been a risk to others in the past 6 months? No.  Has the patient been a risk to others within the distant past? No.    Alcohol Screening:   Substance Abuse History in the last 12 months:  Yes.   Consequences of Substance Abuse: Medical Consequences:    Legal Consequences:    Family Consequences:    Blackouts:    DT's:   Withdrawal Symptoms:   Diaphoresis Nausea Tremors Vomiting Previous Psychotropic Medications: Yes  Psychological Evaluations: Yes  Past Medical History:  Past Medical History:  Diagnosis Date   Bipolar disorder (HCC)    Cellulitis    Depression    Drug abuse (HCC)    Hepatitis C     Past Surgical History:  Procedure Laterality Date   BACK SURGERY     ROTATOR CUFF REPAIR     SHOULDER SURGERY     Family History:  Family History  Problem Relation Age of Onset   Alcohol abuse Father    Cancer Maternal Aunt     Tobacco Screening:   Social History:  Social History   Substance and Sexual Activity  Alcohol Use Not Currently   Comment: 3 bottles of wine per day     Social History   Substance and Sexual Activity  Drug Use Not Currently   Types: Marijuana, Benzodiazepines, Other-see comments, Methamphetamines, Cocaine   Comment: denies    Additional Social History:                           Allergies:   Allergies   Allergen Reactions   Lithium Anaphylaxis   Penicillins Rash    Has patient had a PCN reaction causing immediate rash, facial/tongue/throat swelling, SOB or lightheadedness with hypotension: Yes Has patient had a PCN reaction causing severe rash involving mucus membranes or skin necrosis: No Has patient had a PCN reaction that required hospitalization: No Has patient had a PCN reaction occurring within the last 10 years: No If all of the above answers are "NO", then may proceed with Cephalosporin use.    Lab Results:  Results for orders placed or performed during the hospital encounter of 09/21/21 (from the past 48 hour(s))  Hemoglobin A1c     Status: None   Collection Time: 09/22/21  6:08 AM  Result Value Ref Range   Hgb A1c MFr Bld 5.4 4.8 - 5.6 %    Comment: (NOTE) Pre diabetes:          5.7%-6.4%  Diabetes:              >6.4%  Glycemic control for   <7.0% adults with diabetes  Mean Plasma Glucose 108.28 mg/dL    Comment: Performed at [facility] [address], [place], [place] [address]  Lipid panel     Status: None   Collection Time: 09/22/21  6:08 AM  Result Value Ref Range   Cholesterol 143 0 - 200 mg/dL   Triglycerides 54 <150 mg/dL   HDL 66 >40 mg/dL   Total CHOL/HDL Ratio 2.2 RATIO   VLDL 11 0 - 40 mg/dL   LDL Cholesterol 66 0 - 99 mg/dL    Comment:        Total Cholesterol/HDL:CHD Risk Coronary Heart Disease Risk Table                     Men   Women  1/2 Average Risk   3.4   3.3  Average Risk       5.0   4.4  2 X Average Risk   9.6   7.1  3 X Average Risk  23.4   11.0        Use the calculated Patient Ratio above and the CHD Risk Table to determine the patient's CHD Risk.        ATP III CLASSIFICATION (LDL):  <100     mg/dL   Optimal  100-129  mg/dL   Near or Above                    Optimal  130-159  mg/dL   Borderline  160-189  mg/dL   High  >190     mg/dL   Very High Performed at [facility] [address]  [place], [place] [address]   TSH     Status: None   Collection Time: 09/22/21  6:08 AM  Result Value Ref Range   TSH 2.873 0.350 - 4.500 uIU/mL    Comment: Performed by a 3rd Generation assay with a functional sensitivity of <=0.01 uIU/mL. Performed at [facility], [place] [address], [place], [place] [address]     Blood Alcohol level:  Lab Results  Component Value Date   ETH 46 (H) 09/21/2021   ETH 133 (H) [id number]    Metabolic Disorder Labs:  Lab Results  Component Value Date   HGBA1C 5.4 09/22/2021   MPG 108.28 09/22/2021   MPG 117 04/05/2021   No results found for: PROLACTIN Lab Results  Component Value Date   CHOL 143 09/22/2021   TRIG 54 09/22/2021   HDL 66 09/22/2021   CHOLHDL 2.2 09/22/2021   VLDL 11 09/22/2021   LDLCALC 66 09/22/2021   LDLCALC 64 04/05/2021    Current Medications: Current Facility-Administered Medications  Medication Dose Route Frequency Provider Last Rate Last Admin   acetaminophen (TYLENOL) tablet 650 mg  650 mg Oral Q6H PRN Ethelene Hal, NP   650 mg at 09/22/21 0714   alum & mag hydroxide-simeth (MAALOX/MYLANTA) 200-200-20 MG/5ML suspension 30 mL  30 mL Oral Q4H PRN Ethelene Hal, NP       chlordiazePOXIDE (LIBRIUM) capsule 25 mg  25 mg Oral QID Jarad Barth, Ovid Curd, MD       Followed by   Derrill Memo ON 09/24/2021] chlordiazePOXIDE (LIBRIUM) capsule 25 mg  25 mg Oral TID Janine Limbo, MD       Followed by   Derrill Memo ON 09/25/2021] chlordiazePOXIDE (LIBRIUM) capsule 25 mg  25 mg Oral BH-qamhs Boe Deans, Ovid Curd, MD       Followed by   Derrill Memo ON 09/26/2021] chlordiazePOXIDE (LIBRIUM)  capsule 25 mg  25 mg Oral Daily Flynt Breeze, MD       chlordiazePOXIDE (LIBRIUM) capsule 50 mg  50 mg Oral Once Kourtlynn Trevor, Ovid Curd, MD       cloNIDine (CATAPRES) tablet 0.1 mg  0.1 mg Oral BID Chikita Dogan, Ovid Curd, MD   0.1 mg at 09/22/21 1258   feeding supplement (BOOST / RESOURCE BREEZE) liquid 1 Container  237 mL Oral BID BM  Vilas Edgerly, Ovid Curd, MD   1 Container at 09/22/21 1300   hydrOXYzine (ATARAX) tablet 25 mg  25 mg Oral TID PRN Ethelene Hal, NP   25 mg at 09/22/21 1415   loperamide (IMODIUM) capsule 2-4 mg  2-4 mg Oral PRN Ethelene Hal, NP       LORazepam (ATIVAN) injection 2 mg  2 mg Intramuscular BID PRN Lalla Laham, Ovid Curd, MD       LORazepam (ATIVAN) tablet 1 mg  1 mg Oral Q2H PRN Mazell Aylesworth, MD       magnesium hydroxide (MILK OF MAGNESIA) suspension 30 mL  30 mL Oral Daily PRN Ethelene Hal, NP       multivitamin with minerals tablet 1 tablet  1 tablet Oral Daily Ethelene Hal, NP   1 tablet at 09/22/21 [id number]   nicotine (NICODERM CQ - dosed in mg/24 hours) patch 21 mg  21 mg Transdermal Q0600 Ethelene Hal, NP   21 mg at 09/22/21 0714   ondansetron (ZOFRAN-ODT) disintegrating tablet 4 mg  4 mg Oral Q6H PRN Ethelene Hal, NP   4 mg at 09/22/21 1414   [START ON 09/23/2021] thiamine tablet 100 mg  100 mg Oral Daily Ivannah Zody, Ovid Curd, MD       traZODone (DESYREL) tablet 50 mg  50 mg Oral QHS PRN Ethelene Hal, NP   50 mg at 09/21/21 2113   PTA Medications: Medications Prior to Admission  Medication Sig Dispense Refill Last Dose   doxepin (SINEQUAN) 25 MG capsule Take 1 capsule (25 mg total) by mouth at bedtime. (Patient not taking: Reported on 09/21/2021) 14 capsule 0    furosemide (LASIX) 20 MG tablet Take 1 tablet (20 mg total) by mouth 2 (two) times daily. (Patient not taking: Reported on 09/21/2021) 14 tablet 0    gabapentin (NEURONTIN) 400 MG capsule Take 2 capsules (800 mg total) by mouth 3 (three) times daily. (Patient not taking: Reported on 09/21/2021) 26 capsule 0    metoprolol tartrate (LOPRESSOR) 50 MG tablet Take 1 tablet (50 mg total) by mouth 2 (two) times daily. (Patient not taking: Reported on 09/21/2021) 14 tablet 0    QUEtiapine (SEROQUEL) 100 MG tablet Take 1 tablet (100 mg total) by mouth at bedtime. (Patient not taking: Reported on  09/21/2021) 14 tablet 0    sertraline (ZOLOFT) 100 MG tablet Take 1 tablet (100 mg total) by mouth daily. (Patient not taking: Reported on 09/21/2021) 14 tablet 0     Musculoskeletal: Strength & Muscle Tone: within normal limits Gait & Station: normal Patient leans: N/A            Psychiatric Specialty Exam:  Presentation  General Appearance: Disheveled (smells of urine)  Eye Contact:Fleeting  Speech:Normal Rate  Speech Volume:Normal  Handedness:Right   Mood and Affect  Mood:Depressed; Dysphoric; Hopeless  Affect:Congruent; Constricted   Thought Process  Thought Processes:Linear; Goal Directed  Duration of Psychotic Symptoms: Greater than six months  Past Diagnosis of Schizophrenia or Psychoactive disorder: No  Descriptions of Associations:Intact  Orientation:Full (Time, Place and Person)  Thought Content:Logical  Hallucinations:Hallucinations: Visual (+tactile hallucinations) Description of Visual Hallucinations: rats  Ideas of Reference:None  Suicidal Thoughts:Suicidal Thoughts: Yes, Passive SI Passive Intent and/or Plan: Without Intent; Without Plan  Homicidal Thoughts:Homicidal Thoughts: No   Sensorium  Memory:Immediate Fair; Recent Fair; Remote Fair  Judgment:Poor  Insight:Poor   Executive Functions  Concentration:Fair  Attention Span:Fair  Recall:Poor  Fund of Knowledge:Poor  Language:Fair   Psychomotor Activity  Psychomotor Activity:Psychomotor Activity: Normal   Assets  Assets:Resilience   Sleep  Sleep:Sleep: Poor    Physical Exam: Physical Exam Vitals reviewed.  Constitutional:      General: He is in acute distress.     Appearance: He is normal weight. He is ill-appearing and toxic-appearing.  Pulmonary:     Effort: Pulmonary effort is normal. No respiratory distress.  Neurological:     Mental Status: He is alert.   Review of Systems  Constitutional:  Positive for chills, diaphoresis and  malaise/fatigue. Negative for fever.  Cardiovascular:  Negative for chest pain and palpitations.  Gastrointestinal:  Positive for nausea.  Neurological:  Positive for tremors and headaches.  Psychiatric/Behavioral:  Positive for depression, hallucinations, substance abuse and suicidal ideas. The patient is nervous/anxious and has insomnia.   All other systems reviewed and are negative.  Blood pressure 138/87, pulse (!) 105, temperature 98.3 F (36.8 C), temperature source Oral, resp. rate 17, height 5\' 9"  (1.753 m), weight 73.2 kg, SpO2 100 %. Body mass index is 23.83 kg/m.  Treatment Plan Summary:  ASSESSMENT:  Diagnoses / Active Problems: Major depressive disorder, recurrent, severe, without psychotic features Alcohol use disorder, severe Generalized anxiety disorder PTSD   PLAN: Safety and Monitoring:  --  Voluntary admission to inpatient psychiatric unit for safety, stabilization and treatment  -- Daily contact with patient to assess and evaluate symptoms and progress in treatment  -- Patient's case to be discussed in multi-disciplinary team meeting  -- Observation Level : q15 minute checks  -- Vital signs:  q12 hours  -- Precautions: suicide, elopement, and assault  2. Psychiatric Diagnoses and Treatment:    Start Zoloft 25 mg once daily, and then increase to 50 mg once daily-for depression, anxiety, PTSD.  Change alcohol withdrawal management:  -Patient was given 2 doses of Valium 5 mg once as needed today, to mitigate alcohol withdrawal symptoms -We will change from Ativan taper to Librium taper protocol.  Patient has history of hepatitis C and some mildly elevated liver enzymes.  We will acknowledge this, and monitor.  Discussed plan with pharmacist. -We will administer 1 dose of 50 mg Librium now, then continue with Librium taper -We will add Ativan 1 mg as needed every 2 hours for CIWA over 8, and call MD if CIWA over 15  Continue other medications as ordered  EKG  ordered Labs ordered  --  The risks/benefits/side-effects/alternatives to this medication were discussed in detail with the patient and time was given for questions. The patient consents to medication trial.    -- Metabolic profile and EKG monitoring obtained while on an atypical antipsychotic (BMI: Lipid Panel: HbgA1c: QTc:)   -- Encouraged patient to participate in unit milieu and in scheduled group therapies   -- Short Term Goals: Ability to identify changes in lifestyle to reduce recurrence of condition will improve, Ability to verbalize feelings will improve, Ability to disclose and discuss suicidal ideas, Ability to demonstrate self-control will improve, Ability to identify and develop effective coping behaviors will improve, Ability to maintain clinical measurements within normal limits will  improve, Compliance with prescribed medications will improve, and Ability to identify triggers associated with substance abuse/mental health issues will improve  -- Long Term Goals: Improvement in symptoms so as ready for discharge    3. Medical Issues Being Addressed:   Tobacco Use Disorder  -- Nicotine patch 21mg /24 hours ordered  -- Smoking cessation encouraged  4. Discharge Planning:   -- Social work and case management to assist with discharge planning and identification of hospital follow-up needs prior to discharge  -- Estimated LOS: 5-7 days  -- Discharge Concerns: Need to establish a safety plan; Medication compliance and effectiveness  -- Discharge Goals: Return home with outpatient referrals for mental health follow-up including medication management/psychotherapy   Observation Level/Precautions:  15 minute checks  Laboratory: See above  Psychotherapy: Supportive and group therapy  Medications:    Consultations:    Discharge Concerns:    Estimated LOS: 5 to 7 days  Other:     Physician Treatment Plan for Primary Diagnosis: MDD (major depressive disorder), recurrent severe,  without psychosis ([place])  Physician Treatment Plan for Secondary Diagnosis: Principal Problem:   MDD (major depressive disorder), recurrent severe, without psychosis ([place])  I certify that inpatient services furnished can reasonably be expected to improve the patient's condition.    Total Time Spent in Direct Patient Care:  I personally spent 60 minutes on the unit in direct patient care. The direct patient care time included face-to-face time with the patient, reviewing the patient's chart, communicating with other professionals, and coordinating care. Greater than 50% of this time was spent in counseling or coordinating care with the patient regarding goals of hospitalization, psycho-education, and discharge planning needs.    Christoper Allegra, MD 12/2/20224:10 PM

## 2021-09-22 NOTE — [facility] Group Notes (Signed)
Patient did not attend group.

## 2021-09-22 NOTE — Progress Notes (Signed)
   09/21/21 2115  Psych Admission Type (Psych Patients Only)  Admission Status Voluntary  Psychosocial Assessment  Patient Complaints Anxiety;Depression;Substance abuse;Self-harm thoughts  Eye Contact Avoids  Facial Expression Pained;Flat  Affect Anxious;Depressed  Speech Slow  Interaction Guarded;Minimal  Motor Activity Pacing  Appearance/Hygiene Body odor;Disheveled  Behavior Characteristics Cooperative;Anxious;Guarded;Fidgety  Mood Anxious;Depressed;Preoccupied  Thought Process  Coherency Disorganized  Content Blaming self  Delusions None reported or observed  Perception WDL  Hallucination None reported or observed  Judgment Impaired  Confusion None  Danger to Self  Current suicidal ideation? Passive  Self-Injurious Behavior No self-injurious ideation or behavior indicators observed or expressed   Agreement Not to Harm Self Yes  Description of Agreement approach staff  Danger to Others  Danger to Others None reported or observed   Pt seen walking around the unit talking to himself. Pt is in scrubs and disheveled. Pt denies HI, AVH. Pt endorses passive SI without a plan. Pt contracts for safety. Pt states that he came here to get help because he is drinking too much. He states he drinks at least a fifth or two a day. Pt endorses pain 5/10 as a headache. Pt rates anxiety 9/10 and depression 8/10. Pt endorses the following withdrawal symptoms: headache, anxiety, sweats, tremors and nausea. Last drink was today per pt. Pt on Ativan taper. CIWA = 10.

## 2021-09-22 NOTE — Progress Notes (Signed)
   09/22/21 2000  CIWA-Ar  BP (!) 154/89  Pulse Rate (!) 113  Nausea and Vomiting 1  Tactile Disturbances 2  Tremor 2  Auditory Disturbances 2  Paroxysmal Sweats 1  Visual Disturbances 2  Anxiety 2  Headache, Fullness in Head 1  Agitation 0  Orientation and Clouding of Sensorium 0  CIWA-Ar Total 13  Patient started shaking his hands when I was doing his CIWA assessement and Prn Vistaril given for anxiety at 2023, and ativan given at 2100. Patient stated that he is suppose to get ativan every 2 hours without fail. Patient educated on the Ciwa protocol. Encouraged to PO fluids and compliant. Patient is currently in bed sleeping no sings of symptoms withdrawals noted at present. Will continue to monitor.

## 2021-09-22 NOTE — BH IP Treatment Plan (Signed)
Interdisciplinary Treatment and Diagnostic Plan Update  09/22/2021 JAKWON GAYTON MRN: [id number]  Principal Diagnosis: MDD (major depressive disorder), recurrent severe, without psychosis ([place])  Secondary Diagnoses: Principal Problem:   MDD (major depressive disorder), recurrent severe, without psychosis ([place])   Current Medications:  Current Facility-Administered Medications  Medication Dose Route Frequency Provider Last Rate Last Admin   acetaminophen (TYLENOL) tablet 650 mg  650 mg Oral Q6H PRN Ethelene Hal, NP   650 mg at 09/22/21 0714   alum & mag hydroxide-simeth (MAALOX/MYLANTA) 200-200-20 MG/5ML suspension 30 mL  30 mL Oral Q4H PRN Ethelene Hal, NP       cloNIDine (CATAPRES) tablet 0.1 mg  0.1 mg Oral BID Massengill, Ovid Curd, MD   0.1 mg at 09/22/21 1258   diazepam (VALIUM) tablet 5 mg  5 mg Oral Once PRN Massengill, Ovid Curd, MD       feeding supplement (BOOST / RESOURCE BREEZE) liquid 1 Container  237 mL Oral BID BM Massengill, Ovid Curd, MD   1 Container at 09/22/21 1300   hydrOXYzine (ATARAX) tablet 25 mg  25 mg Oral TID PRN Ethelene Hal, NP   25 mg at 09/22/21 1415   loperamide (IMODIUM) capsule 2-4 mg  2-4 mg Oral PRN Ethelene Hal, NP       LORazepam (ATIVAN) injection 2 mg  2 mg Intramuscular BID PRN Massengill, Ovid Curd, MD       LORazepam (ATIVAN) tablet 1 mg  1 mg Oral QID Ethelene Hal, NP   1 mg at 09/22/21 1259   Followed by   Derrill Memo ON 09/23/2021] LORazepam (ATIVAN) tablet 1 mg  1 mg Oral TID Ethelene Hal, NP       Followed by   Derrill Memo ON 09/24/2021] LORazepam (ATIVAN) tablet 1 mg  1 mg Oral BID Ethelene Hal, NP       Followed by   Derrill Memo ON 09/25/2021] LORazepam (ATIVAN) tablet 1 mg  1 mg Oral Daily Ethelene Hal, NP       LORazepam (ATIVAN) tablet 1 mg  1 mg Oral Q1H PRN Massengill, Ovid Curd, MD       LORazepam (ATIVAN) tablet 2 mg  2 mg Oral Q1H PRN Massengill, Ovid Curd, MD       LORazepam (ATIVAN) tablet 3 mg   3 mg Oral Q1H PRN Massengill, Ovid Curd, MD       LORazepam (ATIVAN) tablet 4 mg  4 mg Oral Q1H PRN Massengill, Nathan, MD       magnesium hydroxide (MILK OF MAGNESIA) suspension 30 mL  30 mL Oral Daily PRN Ethelene Hal, NP       multivitamin with minerals tablet 1 tablet  1 tablet Oral Daily Ethelene Hal, NP   1 tablet at 09/22/21 9390   nicotine (NICODERM CQ - dosed in mg/24 hours) patch 21 mg  21 mg Transdermal Q0600 Ethelene Hal, NP   21 mg at 09/22/21 0714   ondansetron (ZOFRAN-ODT) disintegrating tablet 4 mg  4 mg Oral Q6H PRN Ethelene Hal, NP   4 mg at 09/22/21 1414   thiamine tablet 100 mg  100 mg Oral Daily Ethelene Hal, NP   100 mg at 09/22/21 3009   traZODone (DESYREL) tablet 50 mg  50 mg Oral QHS PRN Ethelene Hal, NP   50 mg at 09/21/21 2113   PTA Medications: Medications Prior to Admission  Medication Sig Dispense Refill Last Dose   doxepin (SINEQUAN) 25 MG capsule Take 1 capsule (  25 mg total) by mouth at bedtime. (Patient not taking: Reported on 09/21/2021) 14 capsule 0    furosemide (LASIX) 20 MG tablet Take 1 tablet (20 mg total) by mouth 2 (two) times daily. (Patient not taking: Reported on 09/21/2021) 14 tablet 0    gabapentin (NEURONTIN) 400 MG capsule Take 2 capsules (800 mg total) by mouth 3 (three) times daily. (Patient not taking: Reported on 09/21/2021) 26 capsule 0    metoprolol tartrate (LOPRESSOR) 50 MG tablet Take 1 tablet (50 mg total) by mouth 2 (two) times daily. (Patient not taking: Reported on 09/21/2021) 14 tablet 0    QUEtiapine (SEROQUEL) 100 MG tablet Take 1 tablet (100 mg total) by mouth at bedtime. (Patient not taking: Reported on 09/21/2021) 14 tablet 0    sertraline (ZOLOFT) 100 MG tablet Take 1 tablet (100 mg total) by mouth daily. (Patient not taking: Reported on 09/21/2021) 14 tablet 0     Patient Stressors: Financial difficulties   Occupational concerns   Substance abuse   Other: homeless    Patient  Strengths: Ability for insight   Treatment Modalities: Medication Management, Group therapy, Case management,  1 to 1 session with clinician, Psychoeducation, Recreational therapy.   Physician Treatment Plan for Primary Diagnosis: MDD (major depressive disorder), recurrent severe, without psychosis ([place]) Long Term Goal(s):     Short Term Goals:    Medication Management: Evaluate patient's response, side effects, and tolerance of medication regimen.  Therapeutic Interventions: 1 to 1 sessions, Unit Group sessions and Medication administration.  Evaluation of Outcomes: Not Met  Physician Treatment Plan for Secondary Diagnosis: Principal Problem:   MDD (major depressive disorder), recurrent severe, without psychosis ([place])  Long Term Goal(s):     Short Term Goals:       Medication Management: Evaluate patient's response, side effects, and tolerance of medication regimen.  Therapeutic Interventions: 1 to 1 sessions, Unit Group sessions and Medication administration.  Evaluation of Outcomes: Not Met   RN Treatment Plan for Primary Diagnosis: MDD (major depressive disorder), recurrent severe, without psychosis ([place]) Long Term Goal(s): Knowledge of disease and therapeutic regimen to maintain health will improve  Short Term Goals: Ability to participate in decision making will improve, Ability to verbalize feelings will improve, and Ability to identify and develop effective coping behaviors will improve  Medication Management: RN will administer medications as ordered by provider, will assess and evaluate patient's response and provide education to patient for prescribed medication. RN will report any adverse and/or side effects to prescribing provider.  Therapeutic Interventions: 1 on 1 counseling sessions, Psychoeducation, Medication administration, Evaluate responses to treatment, Monitor vital signs and CBGs as ordered, Perform/monitor CIWA, COWS, AIMS and Fall Risk screenings as  ordered, Perform wound care treatments as ordered.  Evaluation of Outcomes: Not Met   LCSW Treatment Plan for Primary Diagnosis: MDD (major depressive disorder), recurrent severe, without psychosis ([place]) Long Term Goal(s): Safe transition to appropriate next level of care at discharge, Engage patient in therapeutic group addressing interpersonal concerns.  Short Term Goals: Engage patient in aftercare planning with referrals and resources, Facilitate acceptance of mental health diagnosis and concerns, and Facilitate patient progression through stages of change regarding substance use diagnoses and concerns  Therapeutic Interventions: Assess for all discharge needs, 1 to 1 time with Social worker, Explore available resources and support systems, Assess for adequacy in community support network, Educate family and significant other(s) on suicide prevention, Complete Psychosocial Assessment, Interpersonal group therapy.  Evaluation of Outcomes: Not Met   Progress  in Treatment: Attending groups: Yes. Participating in groups: Yes. Taking medication as prescribed: Yes. Toleration medication: Yes. Family/Significant other contact made: No, will contact:  CSW will obtain Patient understands diagnosis: No. Discussing patient identified problems/goals with staff: Yes. Medical problems stabilized or resolved: Yes. Denies suicidal/homicidal ideation: Yes. Issues/concerns per patient self-inventory: No. Other: None  New problem(s) identified: No, Describe:  None  New Short Term/Long Term Goal(s):medication stabilization, elimination of SI thoughts, development of comprehensive mental wellness plan.   Patient Goals:  "get detoxed"  Discharge Plan or Barriers: Patient recently admitted. CSW will continue to follow and assess for appropriate referrals and possible discharge planning.   Reason for Continuation of Hospitalization: Depression Medication stabilization Suicidal ideation  Estimated  Length of Stay: 3-5 days   Scribe for Treatment Team: Eliott Nine 09/22/2021 3:13 PM

## 2021-09-22 NOTE — Group Note (Signed)
Recreation Therapy Group Note   Group Topic:Stress Management  Group Date: 09/22/2021 Start Time: 0931 End Time: 0950 Facilitators: Caroll Rancher, [place] Location: [address] Day[address]  Goal Area(s) Addresses:  Patient will identify positive stress management techniques. Patient will identify benefits of using stress management post d/c.  Group Description:  Meditation.  LRT played a meditation that focused on making the most of your day and treating each day as a new opportunity to try new things.  It's also an opportunity to correct things that may have gone wrong at previous moments.    Affect/Mood: N/A   Participation Level: Did not attend    Clinical Observations/Individualized Feedback:     Plan: Continue to engage patient in RT group sessions 2-3x/week.   Caroll Rancher, Antonietta Jewel 09/22/2021 12:38 PM

## 2021-09-22 NOTE — Progress Notes (Signed)
CIWA completed, patient's score continues to increase.  Physician notified.  Patient ordered 5 mg of valium.  Patient to be reassessed within an hour of administration.

## 2021-09-22 NOTE — Group Note (Signed)
Date:  09/22/2021 Time:  10:58 AM  Group Topic/Focus:  Goals Group:   The focus of this group is to help patients establish daily goals to achieve during treatment and discuss how the patient can incorporate goal setting into their daily lives to aide in recovery.    Participation Level:  Active  Participation Quality:  Appropriate  Affect:  Irritable  Cognitive:  Lacking  Insight: Lacking  Engagement in Group:  Limited  Modes of Intervention:  Discussion  Additional Comments:  Patient wanted to know who his  social worker was.  Reymundo Poll 09/22/2021, 10:58 AM

## 2021-09-22 NOTE — BHH Group Notes (Signed)
Pt attended and participated in AA meeting this evening.  
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
## 2021-09-22 NOTE — Progress Notes (Signed)
Patient endorses SI AVH and tactile hallucinations.  Patient has been actively withdrawing and closely monitored by the physician.  Patient has been able to maintain safety.  Patient became frustrated and stated "if I am not going to feel any better can you just let me go so I can go take a drink.   Assess patient for safety, offer medications as prescribed, engage patient in 1:1 staff talks.   Patient able to contract for safety. Continue to monitor as planned.

## 2021-09-23 DIAGNOSIS — L0390 Cellulitis, unspecified: Secondary | ICD-10-CM | POA: Insufficient documentation

## 2021-09-23 DIAGNOSIS — T402X1A Poisoning by other opioids, accidental (unintentional), initial encounter: Secondary | ICD-10-CM | POA: Insufficient documentation

## 2021-09-23 DIAGNOSIS — Z72811 Adult antisocial behavior: Secondary | ICD-10-CM | POA: Diagnosis present

## 2021-09-23 DIAGNOSIS — F259 Schizoaffective disorder, unspecified: Secondary | ICD-10-CM | POA: Insufficient documentation

## 2021-09-23 MED ORDER — BENZTROPINE MESYLATE 1 MG PO TABS
1.0000 mg | ORAL_TABLET | Freq: Four times a day (QID) | ORAL | Status: DC | PRN
  Administered 2021-09-23: 1 mg via ORAL
  Filled 2021-09-23: qty 1

## 2021-09-23 MED ORDER — HALOPERIDOL 5 MG PO TABS
5.0000 mg | ORAL_TABLET | Freq: Four times a day (QID) | ORAL | Status: DC | PRN
  Administered 2021-09-23: 5 mg via ORAL
  Filled 2021-09-23: qty 1

## 2021-09-23 MED ORDER — HALOPERIDOL LACTATE 5 MG/ML IJ SOLN: 5.0000 mg | Freq: Four times a day (QID) | INTRAMUSCULAR | Status: DC | PRN

## 2021-09-23 MED ORDER — GABAPENTIN 100 MG PO CAPS
100.0000 mg | ORAL_CAPSULE | Freq: Two times a day (BID) | ORAL | Status: DC
Start: 2021-09-23 — End: 2021-09-24
  Administered 2021-09-23 – 2021-09-24 (×2): 100 mg via ORAL
  Filled 2021-09-23 (×5): qty 1

## 2021-09-23 MED ORDER — BENZTROPINE MESYLATE 1 MG/ML IJ SOLN: 1.0000 mg | Freq: Four times a day (QID) | INTRAMUSCULAR | Status: DC | PRN

## 2021-09-23 NOTE — Progress Notes (Signed)
[facility] MD Progress Note  09/23/2021 4:22 PM MATRIX SCHWIND  MRN:  [id number]  History of Present Illness:    Patient is a 45 year old male with a past psychiatric history of major depressive disorder, anxiety disorder, PTSD, and alcohol use disorder, who was admitted to the psychiatric unit for evaluation and treatment of worsening depression and suicidal thoughts with plan.  Case reviewed and discussed with team. Patient noted to start shaking his hands when assessed for CIWA, stated he is supposed to get ativan every 2 hours without fail, provided with education on CIWA. Noted to be sleeping in bed. This morning: On the 300 hall, pt. Stated, " if the don't give me something for this detox I am going to do something that the police will have to come and get me and I can detox in jail". Continually getting up, walking around the room, getting close to some of the other patients. Would sit down and then get up again. Walking in and out of the door numerous times.  Patient noted to make inappropriate comments towards some peers, intimidating towards others, make ominous statements about what might happen if he didn't get "his meds". Patient moved to the 500 hall for safety and closer observation.   Subjective:  Leniel was seen this morning. He is noted to be standing near the door most of the time, tends to watch the in and out and the medication room. On approach, the patient lists off a series of withdrawal symptoms immediately after introductions, states he needs "something for my withdrawal". Additionally requests gabapentin. States he plans to return to living with brother on Monday, since he has court on Monday evening (?night court). States cannot contact brother as there is no cell phone and no house phone. Patient previously homeless, unable to explain how he has spoke to brother recently. Patient states he is not sleeping or eating well. No hallucinations, no SI/HI.   Principal Problem: MDD (major  depressive disorder), recurrent severe, without psychosis ([place]) Diagnosis: Principal Problem:   MDD (major depressive disorder), recurrent severe, without psychosis ([place]) Active Problems:   Alcohol withdrawal ([place])   Hepatitis C virus infection without hepatic coma   Tobacco dependence   Alcohol dependence with alcohol-induced mood disorder ([place])   Adult antisocial behavior  Total Time spent with patient: 30 minutes  Past Psychiatric History: previous diagnosis of PTSD, anxiety, depression. >5 hospitalizations. Has taken zoloft, zyprexa.   Past Medical History:  Past Medical History:  Diagnosis Date   Bipolar disorder ([place])    Cellulitis    Depression    Drug abuse ([place])    Hepatitis C     Past Surgical History:  Procedure Laterality Date   BACK SURGERY     ROTATOR CUFF REPAIR     SHOULDER SURGERY     Family History:  Family History  Problem Relation Age of Onset   Alcohol abuse Father    Cancer Maternal Aunt    Family Psychiatric  History: N/A Social History:  Social History   Substance and Sexual Activity  Alcohol Use Not Currently   Comment: 3 bottles of wine per day     Social History   Substance and Sexual Activity  Drug Use Not Currently   Types: Marijuana, Benzodiazepines, Other-see comments, Methamphetamines, Cocaine   Comment: denies    Social History   Socioeconomic History   Marital status: Divorced    Spouse name: Not on file   Number of children: 2   Years  of education: Not on file   Highest education level: GED or equivalent  Occupational History   Not on file  Tobacco Use   Smoking status: Every Day    Packs/day: 1.00    Years: 31.00    Pack years: 31.00    Types: Cigarettes    Start date: 10/22/1981   Smokeless tobacco: Never  Vaping Use   Vaping Use: Never used  Substance and Sexual Activity   Alcohol use: Not Currently    Comment: 3 bottles of wine per day   Drug use: Not Currently    Types: Marijuana, Benzodiazepines,  Other-see comments, Methamphetamines, Cocaine    Comment: denies   Sexual activity: Not Currently  Other Topics Concern   Not on file  Social History Narrative   Not on file   Social Determinants of Health   Financial Resource Strain: Not on file  Food Insecurity: Not on file  Transportation Needs: Not on file  Physical Activity: Not on file  Stress: Not on file  Social Connections: Not on file   Additional Social History:                         Sleep: Poor  Appetite:  Poor  Current Medications: Current Facility-Administered Medications  Medication Dose Route Frequency Provider Last Rate Last Admin   acetaminophen (TYLENOL) tablet 650 mg  650 mg Oral Q6H PRN Ethelene Hal, NP   650 mg at 09/23/21 0606   alum & mag hydroxide-simeth (MAALOX/MYLANTA) 200-200-20 MG/5ML suspension 30 mL  30 mL Oral Q4H PRN Ethelene Hal, NP       haloperidol (HALDOL) tablet 5 mg  5 mg Oral Q6H PRN Maida Sale, MD   5 mg at 09/23/21 1454   And   benztropine (COGENTIN) tablet 1 mg  1 mg Oral Q6H PRN Maida Sale, MD   1 mg at 09/23/21 1454   haloperidol lactate (HALDOL) injection 5 mg  5 mg Intramuscular Q6H PRN Maida Sale, MD       And   benztropine mesylate (COGENTIN) injection 1 mg  1 mg Intramuscular Q6H PRN Kynzlie Hilleary, Jackie Plum, MD       chlordiazePOXIDE (LIBRIUM) capsule 25 mg  25 mg Oral QID Massengill, Ovid Curd, MD   25 mg at 09/23/21 1122   Followed by   Derrill Memo ON 09/24/2021] chlordiazePOXIDE (LIBRIUM) capsule 25 mg  25 mg Oral TID Janine Limbo, MD       Followed by   Derrill Memo ON 09/25/2021] chlordiazePOXIDE (LIBRIUM) capsule 25 mg  25 mg Oral BH-qamhs Massengill, Nathan, MD       Followed by   Derrill Memo ON 09/26/2021] chlordiazePOXIDE (LIBRIUM) capsule 25 mg  25 mg Oral Daily Massengill, Nathan, MD       cloNIDine (CATAPRES) tablet 0.1 mg  0.1 mg Oral BID Massengill, Nathan, MD   0.1 mg at 09/23/21 0804   feeding supplement (BOOST /  RESOURCE BREEZE) liquid 1 Container  237 mL Oral BID BM Massengill, Ovid Curd, MD   1 Container at 09/23/21 1350   hydrOXYzine (ATARAX) tablet 25 mg  25 mg Oral TID PRN Ethelene Hal, NP   25 mg at 09/23/21 1455   loperamide (IMODIUM) capsule 2-4 mg  2-4 mg Oral PRN Ethelene Hal, NP       LORazepam (ATIVAN) injection 2 mg  2 mg Intramuscular BID PRN Janine Limbo, MD       LORazepam (ATIVAN) tablet 1  mg  1 mg Oral Q2H PRN Massengill, Harrold Donath, MD   1 mg at 09/23/21 1349   magnesium hydroxide (MILK OF MAGNESIA) suspension 30 mL  30 mL Oral Daily PRN Laveda Abbe, NP       multivitamin with minerals tablet 1 tablet  1 tablet Oral Daily Laveda Abbe, NP   1 tablet at 09/23/21 0804   nicotine (NICODERM CQ - dosed in mg/24 hours) patch 21 mg  21 mg Transdermal Q0600 Laveda Abbe, NP   21 mg at 09/23/21 0610   ondansetron (ZOFRAN-ODT) disintegrating tablet 4 mg  4 mg Oral Q6H PRN Laveda Abbe, NP   4 mg at 09/23/21 1351   [START ON 09/24/2021] sertraline (ZOLOFT) tablet 50 mg  50 mg Oral Daily Massengill, Harrold Donath, MD       thiamine tablet 100 mg  100 mg Oral Daily Massengill, Harrold Donath, MD   100 mg at 09/23/21 0804   traZODone (DESYREL) tablet 50 mg  50 mg Oral QHS PRN Laveda Abbe, NP   50 mg at 09/23/21 0203    Lab Results:  Results for orders placed or performed during the hospital encounter of 09/21/21 (from the past 48 hour(s))  Hemoglobin A1c     Status: None   Collection Time: 09/22/21  6:08 AM  Result Value Ref Range   Hgb A1c MFr Bld 5.4 4.8 - 5.6 %    Comment: (NOTE) Pre diabetes:          5.7%-6.4%  Diabetes:              >6.4%  Glycemic control for   <7.0% adults with diabetes    Mean Plasma Glucose 108.28 mg/dL    Comment: Performed at [facility], [address], [place], [place] [address]  Lipid panel     Status: None   Collection Time: 09/22/21  6:08 AM  Result Value Ref Range   Cholesterol 143 0 - 200 mg/dL    Triglycerides 54 <154 mg/dL   HDL 66 >00 mg/dL   Total CHOL/HDL Ratio 2.2 RATIO   VLDL 11 0 - 40 mg/dL   LDL Cholesterol 66 0 - 99 mg/dL    Comment:        Total Cholesterol/HDL:CHD Risk Coronary Heart Disease Risk Table                     Men   Women  1/2 Average Risk   3.4   3.3  Average Risk       5.0   4.4  2 X Average Risk   9.6   7.1  3 X Average Risk  23.4   11.0        Use the calculated Patient Ratio above and the CHD Risk Table to determine the patient's CHD Risk.        ATP III CLASSIFICATION (LDL):  <100     mg/dL   Optimal  867-619  mg/dL   Near or Above                    Optimal  130-159  mg/dL   Borderline  509-326  mg/dL   High  >712     mg/dL   Very High Performed at [facility], [address], [place], [place] [address]   TSH     Status: None   Collection Time: 09/22/21  6:08 AM  Result Value Ref Range  TSH 2.873 0.350 - 4.500 uIU/mL    Comment: Performed by a 3rd Generation assay with a functional sensitivity of <=0.01 uIU/mL. Performed at [facility], [place] [address] [place], [place] [address]     Blood Alcohol level:  Lab Results  Component Value Date   ETH 46 (H) 09/21/2021   ETH 133 (H) [id number]    Metabolic Disorder Labs: Lab Results  Component Value Date   HGBA1C 5.4 09/22/2021   MPG 108.28 09/22/2021   MPG 117 04/05/2021   No results found for: PROLACTIN Lab Results  Component Value Date   CHOL 143 09/22/2021   TRIG 54 09/22/2021   HDL 66 09/22/2021   CHOLHDL 2.2 09/22/2021   VLDL 11 09/22/2021   LDLCALC 66 09/22/2021   LDLCALC 64 04/05/2021    Physical Findings: AIMS:  , ,  ,  ,    CIWA:  CIWA-Ar Total: 9 COWS:     Musculoskeletal: Strength & Muscle Tone: within normal limits Gait & Station: normal Patient leans: N/A  Psychiatric Specialty Exam:  Presentation  General Appearance: Disheveled  Eye Contact:Fair  Speech:Clear and Coherent  Speech  Volume:Normal  Handedness:Right   Mood and Affect  Mood:Irritable  Affect:Restricted   Thought Process  Thought Processes:Linear  Descriptions of Associations:Intact  Orientation:Full (Time, Place and Person)  Thought Content:Logical  History of Schizophrenia/Schizoaffective disorder:No  Duration of Psychotic Symptoms:Greater than six months  Hallucinations:Hallucinations: None Description of Visual Hallucinations: rats  Ideas of Reference:None  Suicidal Thoughts:Suicidal Thoughts: No SI Passive Intent and/or Plan: Without Intent; Without Plan  Homicidal Thoughts:Homicidal Thoughts: No   Sensorium  Memory:Immediate Fair  Judgment:Poor  Insight:Poor   Executive Functions  Concentration:Poor  Attention Span:Fair  [place]   Psychomotor Activity  Psychomotor Activity:Psychomotor Activity: Normal   Assets  Assets:Leisure Time   Sleep  Sleep:Sleep: Poor    Physical Exam: Physical Exam Vitals and nursing note reviewed.  Constitutional:      Appearance: He is not diaphoretic.  HENT:     Head: Normocephalic.  Eyes:     Extraocular Movements: Extraocular movements intact.  Pulmonary:     Effort: Pulmonary effort is normal.  Musculoskeletal:        General: Normal range of motion.  Neurological:     Mental Status: He is alert.   Review of Systems  Respiratory:  Negative for cough.   Cardiovascular:  Negative for chest pain.  Gastrointestinal:  Positive for abdominal pain, diarrhea and nausea.  Musculoskeletal:  Positive for joint pain and myalgias.  Skin:  Positive for itching.  Neurological:  Positive for tremors and sensory change.  Psychiatric/Behavioral:  Positive for hallucinations ("bugs all over me"). The patient has insomnia.   Blood pressure 115/71, pulse 80, temperature 97.6 F (36.4 C), temperature source Oral, resp. rate 18, height 5\' 9"  (1.753 m), weight 73.2 kg, SpO2 97 %. Body  mass index is 23.83 kg/m.   Treatment Plan Summary: Daily contact with patient to assess and evaluate symptoms and progress in treatment  Medications: Mood/anxiety: continue group therapy, milieu therapy, 1:1 evaluation with provider.  Zoloft 25mg  PO today, 50mg  PO daily starting 09/24/21. Trazodone 50mg  PO QHS PRN sllp Hydroxyzine PRN anxiety PTSD: Group, milieu therapy, 1:1 evaluation with provider.  Substance Abuse: brief intervention provided abstinence advised.   NRT for smoking cessation Ativan taper with CIWA and PRN PRN coverage for symptomatic relief Replace thiamine, multivitamin Medical: PRNs for pain, constipation, indigestion available.    Colletta [place]  Olevia Perches, MD 09/23/2021, 4:22 PM

## 2021-09-23 NOTE — [facility] Group Notes (Signed)
Psychoeducational Group Note    Date:09/23/2021 Time: 1300-1400    Purpose of Group: . The group focus' on teaching patients on how to identify their needs and their Life Skills:  A group where two lists are made. What people need and what are things that we do that are unhealthy. The lists are developed by the patients and it is explained that we often do the actions that are not healthy to get our list of needs met.  Goal:: to develop the coping skills needed to get their needs met  Participation Level: lacking  Participation Quality:  lacking  Affect:  angry  Cognitive:  Oriented  Insight:  lacking  Engagement in Group: not Engaged  Additional Comments:  Pt attended the group on the 300 and 500. On the 300 hall, pt. Stated, " if the don't give me something for this detox I am going to do something that the police will have to come and get me and I can detox in jail". Continually getting up, walking around the room, getting close to some of the other patients. Would sit down and then get up again. Walking in and out of the door numerous times.  Paulino Rily

## 2021-09-23 NOTE — Progress Notes (Signed)
Pt did not attend group. 

## 2021-09-23 NOTE — Progress Notes (Signed)
   09/23/21 1300  Psych Admission Type (Psych Patients Only)  Admission Status Voluntary  Psychosocial Assessment  Patient Complaints Anxiety  Eye Contact Brief  Facial Expression Pained;Flat  Affect Anxious;Depressed  Speech Slow  Interaction Assertive  Motor Activity Pacing  Appearance/Hygiene Body odor;Disheveled  Behavior Characteristics Fidgety;Cooperative  Mood Anxious;Depressed  Thought Process  Coherency Disorganized  Content Blaming others  Delusions None reported or observed  Perception WDL  Hallucination None reported or observed  Judgment Impaired  Confusion None  Danger to Self  Current suicidal ideation? Passive  Self-Injurious Behavior No self-injurious ideation or behavior indicators observed or expressed   Agreement Not to Harm Self Yes  Description of Agreement approach staff  Danger to Others  Danger to Others None reported or observed

## 2021-09-23 NOTE — Group Note (Signed)
LCSW Group Therapy   Due to high patient acuity and low staffing, group was unable to be held on 09/23/2021. The CSW supervisor as well as the on-duty [facility] was made aware.  [name] [name] [name] [name] 12:30 PM

## 2021-09-24 LAB — COMPREHENSIVE METABOLIC PANEL
ALT: 43 U/L (ref 0–44)
AST: 44 U/L — ABNORMAL HIGH (ref 15–41)
Albumin: 4.1 g/dL (ref 3.5–5.0)
Alkaline Phosphatase: 87 U/L (ref 38–126)
Anion gap: 7 (ref 5–15)
BUN: 19 mg/dL (ref 6–20)
CO2: 28 mmol/L (ref 22–32)
Calcium: 9.4 mg/dL (ref 8.9–10.3)
Chloride: 102 mmol/L (ref 98–111)
Creatinine, Ser: 0.8 mg/dL (ref 0.61–1.24)
GFR, Estimated: >60 mL/min (ref >60)
Glucose, Bld: 101 mg/dL — ABNORMAL HIGH (ref 70–99)
Potassium: 4.3 mmol/L (ref 3.5–5.1)
Sodium: 137 mmol/L (ref 135–145)
Total Bilirubin: 0.4 mg/dL (ref 0.3–1.2)
Total Protein: 7.1 g/dL (ref 6.5–8.1)

## 2021-09-24 LAB — CBC WITH DIFFERENTIAL/PLATELET
Abs Immature Granulocytes: 0.03 10*3/uL (ref 0.00–0.07)
Basophils Absolute: 0 10*3/uL (ref 0.0–0.1)
Basophils Relative: 1 %
Eosinophils Absolute: 0.7 10*3/uL — ABNORMAL HIGH (ref 0.0–0.5)
Eosinophils Relative: 10 %
HCT: 39 % (ref 39.0–52.0)
Hemoglobin: 13.1 g/dL (ref 13.0–17.0)
Immature Granulocytes: 0 %
Lymphocytes Relative: 40 %
Lymphs Abs: 3 10*3/uL (ref 0.7–4.0)
MCH: 31.3 pg (ref 26.0–34.0)
MCHC: 33.6 g/dL (ref 30.0–36.0)
MCV: 93.3 fL (ref 80.0–100.0)
Monocytes Absolute: 1.1 10*3/uL — ABNORMAL HIGH (ref 0.1–1.0)
Monocytes Relative: 15 %
Neutro Abs: 2.5 10*3/uL (ref 1.7–7.7)
Neutrophils Relative %: 34 %
Platelets: 277 10*3/uL (ref 150–400)
RBC: 4.18 MIL/uL — ABNORMAL LOW (ref 4.22–5.81)
RDW: 12.8 % (ref 11.5–15.5)
WBC: 7.2 10*3/uL (ref 4.0–10.5)
nRBC: 0 % (ref 0.0–0.2)

## 2021-09-24 MED ORDER — LORAZEPAM 0.5 MG PO TABS
0.5000 mg | ORAL_TABLET | Freq: Three times a day (TID) | ORAL | Status: DC | PRN
  Administered 2021-09-24 – 2021-09-25 (×2): 0.5 mg via ORAL
  Filled 2021-09-24 (×2): qty 1

## 2021-09-24 MED ORDER — GABAPENTIN 300 MG PO CAPS
300.0000 mg | ORAL_CAPSULE | Freq: Three times a day (TID) | ORAL | Status: DC
  Administered 2021-09-24 – 2021-09-25 (×2): 300 mg via ORAL
  Filled 2021-09-24 (×9): qty 1

## 2021-09-24 NOTE — Progress Notes (Signed)
Patient has been up and visible in milieu today, has participated in some activities. Patient continues to report on-going withdrawal symptoms, however does not appear to be in any acute distress in the milieu. Patient has denied SI and has not verbalized any complaints of pain. Safety maintained, will continue to monitor.

## 2021-09-24 NOTE — Progress Notes (Signed)
Adult Psychoeducational Group Note  Date:  09/24/2021 Time:  10:06 PM  Group Topic/Focus:  Wrap-Up Group:   The focus of this group is to help patients review their daily goal of treatment and discuss progress on daily workbooks.  Participation Level:  Active  Participation Quality:  Appropriate  Affect:  Appropriate  Cognitive:  Appropriate  Insight: Appropriate  Engagement in Group:  Engaged  Modes of Intervention:  Discussion  Additional Comments:  Pt stated his goal for today was to focus on his treatment plan. Pt stated he accomplished his goal today. Pt stated he talked with his doctor and social worker about his care today. Pt rated his overall day a 8 out of 10. Pt stated he was able to contact his sister today which improved his overall day. Pt stated he felt better about himself today. Pt stated he was able to attend all meals. Pt stated he took all medications provided today. Pt stated he attend all groups held today. Pt stated his appetite was pretty good today. Pt rated sleep last night was pretty good. Pt stated the goal tonight was to get some rest. Pt stated he had no physical pain tonight. Pt deny visual hallucinations and auditory issues tonight. Pt denies thoughts of harming himself or others. Pt stated he would alert staff if anything changed.  [name] [name] 09/24/2021, 10:06 PM

## 2021-09-24 NOTE — Progress Notes (Signed)
Patient c/o constipation and received MOM. He has been guarded and somewhat irritable as if not wanting to be bothered or talked to.     09/24/21 2100  Psych Admission Type (Psych Patients Only)  Admission Status Voluntary  Psychosocial Assessment  Patient Complaints Anxiety;Irritability  Eye Contact Brief  Facial Expression Flat;Anxious  Affect Anxious;Depressed  Speech Slow  Interaction Assertive  Motor Activity Slow  Appearance/Hygiene Disheveled  Behavior Characteristics Appropriate to situation;Anxious;Guarded;Irritable  Mood Anxious;Preoccupied  Thought Process  Coherency WDL  Content Blaming others  Delusions None reported or observed  Perception WDL  Hallucination None reported or observed  Judgment Impaired  Confusion None  Danger to Self  Current suicidal ideation? Passive  Self-Injurious Behavior No self-injurious ideation or behavior indicators observed or expressed   Agreement Not to Harm Self Yes  Description of Agreement approach staff  Danger to Others  Danger to Others None reported or observed

## 2021-09-24 NOTE — Group Note (Signed)
[facility] LCSW Group Therapy Note  Date/Time:  09/24/2021  11:00AM-12:00PM  Type of Therapy and Topic:  Group Therapy:  Music and Mood  Participation Level:  Did Not Attend   Description of Group: In this process group, members listened to a variety of genres of music and identified that different types of music evoke different responses.  Patients were encouraged to identify music that was soothing for them and music that was energizing for them.  Patients discussed how this knowledge can help with wellness and recovery in various ways including managing depression and anxiety as well as encouraging healthy sleep habits.    Therapeutic Goals: Patients will explore the impact of different varieties of music on mood Patients will verbalize the thoughts they have when listening to different types of music Patients will identify music that is soothing to them as well as music that is energizing to them Patients will discuss how to use this knowledge to assist in maintaining wellness and recovery Patients will explore the use of music as a coping skill  Summary of Patient Progress:  Pt did not attend group. Therapeutic Modalities: Solution Focused Brief Therapy Activity   Steve Rattler, LCSWA 09/24/2021 1:05 PM

## 2021-09-24 NOTE — Progress Notes (Signed)
Patient in bed asleep since shift change. Respirations even and unlabored, no distress noted. Patients vitals taken at 1947 patient was asleep and had to be awakened for vitals. Will continue to monitor.

## 2021-09-24 NOTE — Progress Notes (Signed)
[facility] MD Progress Note  09/24/2021 3:23 PM [name] [name]  MRN:  [id number]  History of Present Illness:    Patient is a 45 year old male with a past psychiatric history of major depressive disorder, anxiety disorder, PTSD, and alcohol use disorder, who was admitted to the psychiatric unit for evaluation and treatment of worsening depression and suicidal thoughts with plan.  Case reviewed and discussed with team. Patient gets PRNs of ativan whenever available. He didn't attend most groups yesterday, did get some today.    Subjective:  [name] was seen this morning. He requested increase in gabapentin. He is slightly better groomed. He continues to advocate for discharge for Monday. He is still having myalgia and joint pain, but overall much less physical symptoms of withdrawal by comparison than yesterday. No hallucinations, thoughts of harm to self or others.   Principal Problem: MDD (major depressive disorder), recurrent severe, without psychosis ([place]) Diagnosis: Principal Problem:   MDD (major depressive disorder), recurrent severe, without psychosis ([place]) Active Problems:   Alcohol withdrawal ([place])   Hepatitis C virus infection without hepatic coma   Tobacco dependence   Alcohol dependence with alcohol-induced mood disorder ([place])   Adult antisocial behavior  Total Time spent with patient: 30 minutes  Past Psychiatric History: previous diagnosis of PTSD, anxiety, depression. >5 hospitalizations. Has taken zoloft, zyprexa.   Past Medical History:  Past Medical History:  Diagnosis Date   Bipolar disorder ([place])    Cellulitis    Depression    Drug abuse ([place])    Hepatitis C     Past Surgical History:  Procedure Laterality Date   BACK SURGERY     ROTATOR CUFF REPAIR     SHOULDER SURGERY     Family History:  Family History  Problem Relation Age of Onset   Alcohol abuse Father    Cancer Maternal Aunt    Family Psychiatric  History: N/A Social History:  Social History    Substance and Sexual Activity  Alcohol Use Not Currently   Comment: 3 bottles of wine per day     Social History   Substance and Sexual Activity  Drug Use Not Currently   Types: Marijuana, Benzodiazepines, Other-see comments, Methamphetamines, Cocaine   Comment: denies    Social History   Socioeconomic History   Marital status: Divorced    Spouse name: Not on file   Number of children: 2   Years of education: Not on file   Highest education level: GED or equivalent  Occupational History   Not on file  Tobacco Use   Smoking status: Every Day    Packs/day: 1.00    Years: 31.00    Pack years: 31.00    Types: Cigarettes    Start date: 10/22/1981   Smokeless tobacco: Never  Vaping Use   Vaping Use: Never used  Substance and Sexual Activity   Alcohol use: Not Currently    Comment: 3 bottles of wine per day   Drug use: Not Currently    Types: Marijuana, Benzodiazepines, Other-see comments, Methamphetamines, Cocaine    Comment: denies   Sexual activity: Not Currently  Other Topics Concern   Not on file  Social History Narrative   Not on file   Social Determinants of Health   Financial Resource Strain: Not on file  Food Insecurity: Not on file  Transportation Needs: Not on file  Physical Activity: Not on file  Stress: Not on file  Social Connections: Not on file   Additional Social History:  Sleep: Poor  Appetite:  Poor  Current Medications: Current Facility-Administered Medications  Medication Dose Route Frequency Provider Last Rate Last Admin   acetaminophen (TYLENOL) tablet 650 mg  650 mg Oral Q6H PRN Laveda Abbe, NP   650 mg at 09/24/21 0356   alum & mag hydroxide-simeth (MAALOX/MYLANTA) 200-200-20 MG/5ML suspension 30 mL  30 mL Oral Q4H PRN Laveda Abbe, NP       haloperidol (HALDOL) tablet 5 mg  5 mg Oral Q6H PRN Roselle Locus, MD   5 mg at 09/23/21 1454   And   benztropine (COGENTIN) tablet 1 mg  1 mg Oral Q6H PRN  Roselle Locus, MD   1 mg at 09/23/21 1454   haloperidol lactate (HALDOL) injection 5 mg  5 mg Intramuscular Q6H PRN Roselle Locus, MD       And   benztropine mesylate (COGENTIN) injection 1 mg  1 mg Intramuscular Q6H PRN Metta Koranda, Shelbie Hutching, MD       chlordiazePOXIDE (LIBRIUM) capsule 25 mg  25 mg Oral TID Phineas Inches, MD   25 mg at 09/24/21 1201   Followed by   Melene Muller ON 09/25/2021] chlordiazePOXIDE (LIBRIUM) capsule 25 mg  25 mg Oral BH-qamhs Massengill, Harrold Donath, MD       Followed by   Melene Muller ON 09/26/2021] chlordiazePOXIDE (LIBRIUM) capsule 25 mg  25 mg Oral Daily Massengill, Nathan, MD       cloNIDine (CATAPRES) tablet 0.1 mg  0.1 mg Oral BID Massengill, Harrold Donath, MD   0.1 mg at 09/24/21 9678   feeding supplement (BOOST / RESOURCE BREEZE) liquid 1 Container  237 mL Oral BID BM Massengill, Harrold Donath, MD   1 Container at 09/23/21 1350   gabapentin (NEURONTIN) capsule 300 mg  300 mg Oral TID Roselle Locus, MD       hydrOXYzine (ATARAX) tablet 25 mg  25 mg Oral TID PRN Laveda Abbe, NP   25 mg at 09/23/21 1455   loperamide (IMODIUM) capsule 2-4 mg  2-4 mg Oral PRN Laveda Abbe, NP       LORazepam (ATIVAN) injection 2 mg  2 mg Intramuscular BID PRN Massengill, Harrold Donath, MD       LORazepam (ATIVAN) tablet 0.5 mg  0.5 mg Oral TID PRN Roselle Locus, MD       magnesium hydroxide (MILK OF MAGNESIA) suspension 30 mL  30 mL Oral Daily PRN Laveda Abbe, NP       multivitamin with minerals tablet 1 tablet  1 tablet Oral Daily Laveda Abbe, NP   1 tablet at 09/24/21 9381   nicotine (NICODERM CQ - dosed in mg/24 hours) patch 21 mg  21 mg Transdermal Q0600 Laveda Abbe, NP   21 mg at 09/24/21 0829   ondansetron (ZOFRAN-ODT) disintegrating tablet 4 mg  4 mg Oral Q6H PRN Laveda Abbe, NP   4 mg at 09/23/21 1351   sertraline (ZOLOFT) tablet 50 mg  50 mg Oral Daily Massengill, Harrold Donath, MD   50 mg at 09/24/21 0175   thiamine  tablet 100 mg  100 mg Oral Daily Massengill, Harrold Donath, MD   100 mg at 09/24/21 0827   traZODone (DESYREL) tablet 50 mg  50 mg Oral QHS PRN Laveda Abbe, NP   50 mg at 09/23/21 0203    Lab Results:  No results found for this or any previous visit (from the past 48 hour(s)).   Blood Alcohol level:  Lab Results  Component Value  Date   ETH 46 (H) 09/21/2021   ETH 133 (H) [id number]    Metabolic Disorder Labs: Lab Results  Component Value Date   HGBA1C 5.4 09/22/2021   MPG 108.28 09/22/2021   MPG 117 04/05/2021   No results found for: PROLACTIN Lab Results  Component Value Date   CHOL 143 09/22/2021   TRIG 54 09/22/2021   HDL 66 09/22/2021   CHOLHDL 2.2 09/22/2021   VLDL 11 09/22/2021   LDLCALC 66 09/22/2021   LDLCALC 64 04/05/2021    Physical Findings: AIMS:  , ,  ,  ,    CIWA:  CIWA-Ar Total: 3 COWS:     Musculoskeletal: Strength & Muscle Tone: within normal limits Gait & Station: normal Patient leans: N/A  Psychiatric Specialty Exam:  Presentation  General Appearance: Appropriate for Environment  Eye Contact:Fair  Speech:Normal Rate  Speech Volume:Normal  Handedness:Right   Mood and Affect  Mood:Euthymic  Affect:Blunt   Thought Process  Thought Processes:Goal Directed  Descriptions of Associations:Intact  Orientation:Full (Time, Place and Person)  Thought Content:Logical  History of Schizophrenia/Schizoaffective disorder:No  Duration of Psychotic Symptoms:Greater than six months  Hallucinations:Hallucinations: None  Ideas of Reference:None  Suicidal Thoughts:Suicidal Thoughts: No  Homicidal Thoughts:Homicidal Thoughts: No   Sensorium  Memory:Immediate Fair  Judgment:Intact  Insight:Poor   Executive Functions  Concentration:Fair  Attention Span:Fair  [place]   Psychomotor Activity  Psychomotor Activity:Psychomotor Activity: Normal   Assets  Assets:Leisure Time;  Communication Skills   Sleep  Sleep:Sleep: Fair    Physical Exam: Physical Exam Vitals and nursing note reviewed.  Constitutional:      Appearance: He is not diaphoretic.  HENT:     Head: Normocephalic.  Eyes:     Extraocular Movements: Extraocular movements intact.  Pulmonary:     Effort: Pulmonary effort is normal.  Musculoskeletal:        General: Normal range of motion.  Neurological:     Mental Status: He is alert.   Review of Systems  Constitutional:  Negative for fever.  Respiratory:  Negative for cough.   Cardiovascular:  Negative for chest pain.  Gastrointestinal:  Positive for diarrhea.  Musculoskeletal:  Positive for joint pain and myalgias.  Skin:  Negative for itching.  Psychiatric/Behavioral:  Negative for hallucinations and suicidal ideas.   Blood pressure 120/77, pulse 88, temperature 97.9 F (36.6 C), temperature source Oral, resp. rate 16, height 5\' 9"  (1.753 m), weight 73.2 kg, SpO2 99 %. Body mass index is 23.83 kg/m.   Treatment Plan Summary: Daily contact with patient to assess and evaluate symptoms and progress in treatment  Medications: Mood/anxiety: continue group therapy, milieu therapy, 1:1 evaluation with provider.  Zoloft 25mg  PO today, 50mg  PO daily starting 09/24/21. Trazodone 50mg  PO QHS PRN sllp Hydroxyzine PRN anxiety PTSD: Group, milieu therapy, 1:1 evaluation with provider.  Substance Abuse: brief intervention provided abstinence advised.   NRT for smoking cessation Ativan taper with CIWA and PRN -reduced frequency of CIWA and dose of Ativan in anticipation of Monday discharge PRN coverage for symptomatic relief Replace thiamine, multivitamin Increase gabapentin 300mg  PO TID Medical: PRNs for pain, constipation, indigestion available.    Maida Sale, MD 09/24/2021, 3:23 PM

## 2021-09-24 NOTE — [facility] Group Notes (Signed)
[facility] Group Notes:  (Nursing/MHT/Case Management/Adjunct)  Date:  09/24/2021  Time:  10:21 AM  Type of Therapy:   Orientation/Goals group  Participation Level:  Minimal  Participation Quality:  Appropriate  Affect:  Appropriate  Cognitive:  Alert  Insight:  Appropriate  Engagement in Group:  Engaged  Modes of Intervention:  Discussion, Education, and Orientation  Summary of Progress/Problems: Pt goal for today is to participate in groups.   [name] [name] J Lataya Varnell 09/24/2021, 10:21 AM

## 2021-09-25 ENCOUNTER — Other Ambulatory Visit (HOSPITAL_COMMUNITY): Payer: Self-pay

## 2021-09-25 DIAGNOSIS — F332 Major depressive disorder, recurrent severe without psychotic features: Principal | ICD-10-CM

## 2021-09-25 MED ORDER — GABAPENTIN 300 MG PO CAPS: 300.0000 mg | ORAL_CAPSULE | Freq: Three times a day (TID) | ORAL | 0 refills | Status: DC

## 2021-09-25 MED ORDER — THIAMINE HCL 100 MG PO TABS: 100.0000 mg | ORAL_TABLET | Freq: Every day | ORAL | Status: DC

## 2021-09-25 MED ORDER — CLONIDINE HCL 0.1 MG PO TABS: 0.1000 mg | ORAL_TABLET | Freq: Every day | ORAL | 0 refills | Status: DC

## 2021-09-25 MED ORDER — SERTRALINE HCL 50 MG PO TABS
50.0000 mg | ORAL_TABLET | Freq: Every day | ORAL | 0 refills | Status: DC
Start: 2021-09-26 — End: 2021-09-25

## 2021-09-25 MED ORDER — NICOTINE 21 MG/24HR TD PT24: 21.0000 mg | MEDICATED_PATCH | Freq: Every day | TRANSDERMAL | 0 refills | Status: DC

## 2021-09-25 MED ORDER — HYDROXYZINE HCL 25 MG PO TABS: 25.0000 mg | ORAL_TABLET | Freq: Three times a day (TID) | ORAL | 0 refills | Status: DC | PRN

## 2021-09-25 MED ORDER — TRAZODONE HCL 50 MG PO TABS
50.0000 mg | ORAL_TABLET | Freq: Every evening | ORAL | 0 refills | Status: DC | PRN
Start: 2021-09-25 — End: 2021-09-25

## 2021-09-25 MED ORDER — TRAZODONE HCL 50 MG PO TABS: 50.0000 mg | ORAL_TABLET | Freq: Every evening | ORAL | 0 refills | Status: DC | PRN

## 2021-09-25 MED ORDER — CLONIDINE HCL 0.1 MG PO TABS
0.1000 mg | ORAL_TABLET | Freq: Every day | ORAL | Status: DC
  Filled 2021-09-25: qty 1

## 2021-09-25 MED ORDER — CLONIDINE HCL 0.1 MG PO TABS
0.1000 mg | ORAL_TABLET | Freq: Every day | ORAL | 0 refills | Status: DC
  Filled 2021-09-25: qty 30, 30d supply, fill #0

## 2021-09-25 MED ORDER — ADULT MULTIVITAMIN W/MINERALS CH: 1.0000 | ORAL_TABLET | Freq: Every day | ORAL | Status: DC

## 2021-09-25 MED ORDER — SERTRALINE HCL 50 MG PO TABS: 50.0000 mg | ORAL_TABLET | Freq: Every day | ORAL | 0 refills | Status: DC

## 2021-09-25 NOTE — Discharge Summary (Signed)
Physician Discharge Summary Note  Patient:  [name] [name] is an 45 y.o., male MRN:  [id number] DOB:  September 20, 1976 Patient phone:  [phone] (home)  Patient address:   [place] [address],  Total Time spent with patient: 30 minutes  Date of Admission:  09/21/2021 Date of Discharge: 09/25/2021  Reason for Admission: Worsening depression, alcohol abuse, +SI with plan to shoot self.  Principal Problem: MDD (major depressive disorder), recurrent severe, without psychosis ([place]) Discharge Diagnoses: Principal Problem:   MDD (major depressive disorder), recurrent severe, without psychosis ([place]) Active Problems:   Alcohol withdrawal ([place])   Hepatitis C virus infection without hepatic coma   Tobacco dependence   Alcohol dependence with alcohol-induced mood disorder ([place])   Adult antisocial behavior  Past Psychiatric History: See Above  Past Medical History:  Past Medical History:  Diagnosis Date   Bipolar disorder ([place])    Cellulitis    Depression    Drug abuse ([place])    Hepatitis C     Past Surgical History:  Procedure Laterality Date   BACK SURGERY     ROTATOR CUFF REPAIR     SHOULDER SURGERY     Family History:  Family History  Problem Relation Age of Onset   Alcohol abuse Father    Cancer Maternal Aunt    Family Psychiatric  History: None provided by patient Social History:  Social History   Substance and Sexual Activity  Alcohol Use Not Currently   Comment: 3 bottles of wine per day     Social History   Substance and Sexual Activity  Drug Use Not Currently   Types: Marijuana, Benzodiazepines, Other-see comments, Methamphetamines, Cocaine   Comment: denies    Social History   Socioeconomic History   Marital status: Divorced    Spouse name: Not on file   Number of children: 2   Years of education: Not on file   Highest education level: GED or equivalent  Occupational History   Not on file  Tobacco Use   Smoking status: Every Day     Packs/day: 1.00    Years: 31.00    Pack years: 31.00    Types: Cigarettes    Start date: 10/22/1981   Smokeless tobacco: Never  Vaping Use   Vaping Use: Never used  Substance and Sexual Activity   Alcohol use: Not Currently    Comment: 3 bottles of wine per day   Drug use: Not Currently    Types: Marijuana, Benzodiazepines, Other-see comments, Methamphetamines, Cocaine    Comment: denies   Sexual activity: Not Currently  Other Topics Concern   Not on file  Social History Narrative   Not on file   Social Determinants of Health   Financial Resource Strain: Not on file  Food Insecurity: Not on file  Transportation Needs: Not on file  Physical Activity: Not on file  Stress: Not on file  Social Connections: Not on file    Hospital Course:  Patient is a 45 year old male with a past psychiatric history of major depressive disorder, anxiety disorder, PTSD, and alcohol use disorder, who was admitted to the psychiatric unit for evaluation and treatment of worsening depression and suicidal thoughts with a plan as noted above.    Prior to this discharge, Elenore Rota was seen and evaluated for mood stability. The current lab results and nurses' notes reviewed, both are stable. At this time, patient is both mentally and medically stable to be discharged to continue mental health care on an  outpatient basis.   Prior to this discharge, Linsey was seen and evaluated for mood stability. The current lab results and nurses' notes reviewed, both are stable. At this time, patient is both mentally and medically stable to be discharged to continue mental health care on an outpatient basis. After evaluation of his presenting symptoms, by his treatment team, [place] was recommended for mood stabilization treatment, and treatment of his alcohol withdrawal symptoms. He was treated and stabilized with a Librium taper & Clonidine for alcohol withdrawals. Pt was also treated for his depressive symptoms with Zoloft 50 mg  given daily, and for insomnia with Trazodone 10 mg given nightly for insomnia respectively. Landen was also enrolled in the group counseling sessions being held on this unit.  He participated and learned new coping skills. He presented with no other medical issues that required treatment.  He tolerated his treatment regimen without any side effects or adverse reactions.  Constantinos's mood has stabilized. This is evidenced by his reports of improved mood, and absence of suicidal/homicidal ideations. He is now being discharged to continue mental health care on an outpatient basis. He currently denies any S/HI/AVH, delusional thoughts, or paranoia. Pt will leave [facility] with bus passes. Pt verbally contracts for safety outside of the hospital, and agrees to follow through with his discharge plan.  Physical Findings: AIMS: 0 CIWA:  CIWA-Ar Total: 0 COWS:  0  Musculoskeletal: Strength & Muscle Tone: within normal limits Gait & Station: normal Patient leans: N/A   Psychiatric Specialty Exam:  Presentation  General Appearance: Appropriate for Environment; Fairly Groomed  Eye Contact:Fair  Speech:Clear and Coherent  Speech Volume:Normal  Handedness:Right   Mood and Affect  Mood:Euthymic  Affect:Appropriate  Thought Process  Thought Processes:Coherent  Descriptions of Associations:Intact  Orientation:Full (Time, Place and Person)  Thought Content:Logical  History of Schizophrenia/Schizoaffective disorder:No  Duration of Psychotic Symptoms:Greater than six months  Hallucinations:Hallucinations: None  Ideas of Reference:None  Suicidal Thoughts:Suicidal Thoughts: No  Homicidal Thoughts:Homicidal Thoughts: No  Sensorium  Memory:Immediate Good  Judgment:Good  Insight:Good  Executive Functions  Concentration:Good  Attention Span:Good  [place]  Psychomotor Activity  Psychomotor Activity:Psychomotor Activity: Normal  Assets   Assets:Communication Skills  Sleep  Sleep:Sleep: Good  Physical Exam: Physical Exam Constitutional:      General: He is not in acute distress. HENT:     Head: Normocephalic.     Right Ear: There is no impacted cerumen.     Nose: No congestion or rhinorrhea.     Mouth/Throat:     Pharynx: No oropharyngeal exudate or posterior oropharyngeal erythema.  Eyes:     General:        Right eye: No discharge.        Left eye: No discharge.  Pulmonary:     Effort: No respiratory distress.  Musculoskeletal:        General: No swelling.     Cervical back: No rigidity.  Skin:    Coloration: Skin is not pale.  Neurological:     General: No focal deficit present.     Mental Status: He is alert and oriented to person, place, and time.  Psychiatric:        Behavior: Behavior normal.        Thought Content: Thought content normal.        Judgment: Judgment normal.   Review of Systems  Constitutional:  Negative for chills, diaphoresis, fever and malaise/fatigue.  HENT:  Negative for nosebleeds and sore throat.  Respiratory:  Negative for cough, shortness of breath and wheezing.   Cardiovascular:  Negative for chest pain.  Gastrointestinal:  Negative for abdominal pain, constipation, diarrhea, heartburn, nausea and vomiting.  Musculoskeletal:  Negative for back pain, joint pain and neck pain.  Neurological:  Negative for dizziness, tingling, tremors, sensory change, loss of consciousness, weakness and headaches.  Psychiatric/Behavioral:  Negative for depression, hallucinations, substance abuse and suicidal ideas. The patient is not nervous/anxious and does not have insomnia.   Blood pressure 117/69, pulse 82, temperature 97.7 F (36.5 C), temperature source Oral, resp. rate 16, height 5\' 9"  (1.753 m), weight 73.2 kg, SpO2 96 %. Body mass index is 23.83 kg/m.   Social History   Tobacco Use  Smoking Status Every Day   Packs/day: 1.00   Years: 31.00   Pack years: 31.00   Types:  Cigarettes   Start date: 10/22/1981  Smokeless Tobacco Never   Tobacco Cessation:  A prescription for an FDA-approved tobacco cessation medication provided at discharge   Blood Alcohol level:  Lab Results  Component Value Date   ETH 46 (H) 09/21/2021   ETH 133 (H) 06/14/2021    Metabolic Disorder Labs:  Lab Results  Component Value Date   HGBA1C 5.4 09/22/2021   MPG 108.28 09/22/2021   MPG 117 04/05/2021   No results found for: PROLACTIN Lab Results  Component Value Date   CHOL 143 09/22/2021   TRIG 54 09/22/2021   HDL 66 09/22/2021   CHOLHDL 2.2 09/22/2021   VLDL 11 09/22/2021   LDLCALC 66 09/22/2021   LDLCALC 64 04/05/2021    See Psychiatric Specialty Exam and Suicide Risk Assessment completed by Attending Physician prior to discharge.  Discharge destination:  Home  Is patient on multiple antipsychotic therapies at discharge:  No   Has Patient had three or more failed trials of antipsychotic monotherapy by history:  No  Recommended Plan for Multiple Antipsychotic Therapies: NA  Discharge Instructions     Diet - low sodium heart healthy   Complete by: As directed    Diet - low sodium heart healthy   Complete by: As directed    Increase activity slowly   Complete by: As directed    Increase activity slowly   Complete by: As directed       Allergies as of 09/25/2021       Reactions   Lithium Anaphylaxis   Penicillins Rash   Has patient had a PCN reaction causing immediate rash, facial/tongue/throat swelling, SOB or lightheadedness with hypotension: Yes Has patient had a PCN reaction causing severe rash involving mucus membranes or skin necrosis: No Has patient had a PCN reaction that required hospitalization: No Has patient had a PCN reaction occurring within the last 10 years: No If all of the above answers are "NO", then may proceed with Cephalosporin use.        Medication List     STOP taking these medications    doxepin 25 MG  capsule Commonly known as: SINEQUAN   furosemide 20 MG tablet Commonly known as: LASIX   metoprolol tartrate 50 MG tablet Commonly known as: LOPRESSOR   QUEtiapine 100 MG tablet Commonly known as: SEROQUEL       TAKE these medications      Indication  cloNIDine 0.1 MG tablet Commonly known as: CATAPRES Take 1 tablet (0.1 mg total) by mouth at bedtime. For alcohol withdrawals and hypertension Start taking on: September 26, 2021  Indication: High Blood Pressure Disorder, alcohol withdrawal  gabapentin 300 MG capsule Commonly known as: NEURONTIN Take 1 capsule (300 mg total) by mouth 3 (three) times daily. 7 days supply for alcohol withdrawals What changed:  medication strength how much to take additional instructions  Indication: Alcohol Withdrawal Syndrome   hydrOXYzine 25 MG tablet Commonly known as: ATARAX Take 1 tablet (25 mg total) by mouth 3 (three) times daily as needed for anxiety. For anxiety  Indication: Feeling Anxious   multivitamin with minerals Tabs tablet Take 1 tablet by mouth daily. Start taking on: September 26, 2021  Indication: Nutritional Support   nicotine 21 mg/24hr patch Commonly known as: NICODERM CQ - dosed in mg/24 hours Place 1 patch (21 mg total) onto the skin daily at 6 (six) AM. Nicotine replacement Start taking on: September 26, 2021  Indication: Nicotine Addiction   sertraline 50 MG tablet Commonly known as: ZOLOFT Take 1 tablet (50 mg total) by mouth daily. For Depression Start taking on: September 26, 2021 What changed:  medication strength how much to take additional instructions  Indication: Major Depressive Disorder   thiamine 100 MG tablet Take 1 tablet (100 mg total) by mouth daily. Start taking on: September 26, 2021  Indication: Deficiency of Vitamin B1   traZODone 50 MG tablet Commonly known as: DESYREL Take 1 tablet (50 mg total) by mouth at bedtime as needed for sleep. For sleep  Indication: [place] Follow up.   Specialty: Behavioral Health Why: Please go to this provider for outpatient therapy and medication management services during walk in hours:  Monday through Wednesday, from 7:00 am to 11:00 am.  Services are provided on a first come, first served basis.  Please arrive early. Contact information: [place] [phone]               Follow-up recommendations:  Other:    Plan Of Care/Follow-up recommendations:  Medications: Continue Zoloft 50 mg PO daily for depression Continue Trazodone 50mg  PO nightly PRN for insomnia  Continue Hydroxyzine PRN for anxiety for anxiety Follow up with [facility] as listed above Follow up with medical Provider for all other medical needs   Prescriptions scripts given at discharge. Patient agreeable to plan.  Given opportunity to ask questions.  Appears to feel comfortable with discharge denies any current suicidal or homicidal thought. Patient is also instructed prior to discharge to: Take all medications as prescribed by his mental healthcare provider. Report any adverse effects and or reactions from the medicines to his outpatient provider promptly. Patient has been instructed & cautioned: To not engage in alcohol and or illegal drug use while on prescription medicines. In the event of worsening symptoms, patient is instructed to call the mobile crisis hotline [phone], National Suicide prevention Lifeline 7192040507, 911 and/or go to the nearest ED for appropriate evaluation and treatment of symptoms. To follow-up with his primary care provider for your other medical issues, concerns and or health care needs.    Signed: Nicholes Rough, NP 09/25/2021, 11:21 AM

## 2021-09-25 NOTE — [facility] Group Notes (Signed)
Adult Orientation Group Note  Date:  09/25/2021 Time:  10:30 AM  Group Topic/Focus:  Orientation:   The focus of this group is to educate the patient on the purpose and policies of crisis stabilization and provide a format to answer questions about their admission.  The group details unit policies and expectations of patients while admitted.  Participation Level:  Active  Participation Quality:  Appropriate  Affect:  Anxious  Cognitive:  Appropriate  Insight: Appropriate  Engagement in Group:  Engaged  Modes of Intervention:  Education  Additional Comments:  Patient's goal for today is discharge. Pt is very eager/anxious about leaving. He says he needs to get back to work and leave for court tomorrow. He stated "if I'm not discharged today I'm going to start tearing this place up". Pt seems agitated. Patient declines having thoughts of suicide/self harm.   Thomas Hoff 09/25/2021, 10:30 AM

## 2021-09-25 NOTE — Progress Notes (Signed)
Pt discharged to lobby. Pt was stable and appreciative at that time. All papers and prescriptions were given and valuables returned. Verbal understanding expressed. Denies SI/HI and A/VH. Pt given opportunity to express concerns and ask questions.  

## 2021-09-25 NOTE — [facility] Suicide Risk Assessment (Addendum)
Suicide Risk Assessment     [facility] Discharge Suicide Risk Assessment   Principal Problem: MDD (major depressive disorder), recurrent severe, without psychosis (HCC)  Discharge Diagnoses: Principal Problem:   MDD (major depressive disorder), recurrent severe, without psychosis (HCC) Active Problems:   Alcohol withdrawal (HCC)   Hepatitis C virus infection without hepatic coma   Tobacco dependence   Alcohol dependence with alcohol-induced mood disorder (HCC)   Adult antisocial behavior   Total Time spent with patient: 30 minutes  Reason for Admission: Worsening depression, alcohol abuse and +SI with plan to shoot himself.  Discharge assessment: Patient is a 45 year old male with a past psychiatric history of major depressive disorder, anxiety disorder, PTSD, and alcohol use disorder, who was admitted to the psychiatric unit for evaluation and treatment of worsening depression and suicidal thoughts with a plan as noted above.     Prior to this discharge, Trinten was seen and evaluated for mood stability. The current lab results and nurses' notes reviewed, both are stable. At this time, patient is both mentally and medically stable to be discharged to continue mental health care on an outpatient basis.   After evaluation of his presenting symptoms, by his treatment team, [place] was recommended for mood stabilization treatment, and treatment of his alcohol withdrawal symptoms. He was treated and stabilized with a Librium taper & Clonidine for alcohol withdrawals. Pt was also treated for his depressive symptoms with Zoloft 50 mg given daily, and for insomnia with Trazodone 10 mg given nightly for insomnia respectively. Keegen was also enrolled in the group counseling sessions being held on this unit.  He participated and learned new coping skills. He presented with no other medical issues that required treatment.  He tolerated his treatment regimen without any side effects or adverse reactions.  Ripken's  mood has stabilized. This is evidenced by his reports of improved mood, and absence of suicidal/homicidal ideations. He is now being discharged to continue mental health care on an outpatient basis. He currently denies any S/HI/AVH, delusional thoughts, or paranoia. Pt will leave [facility] with bus passes. Pt verbally contracts for safety outside of the hospital, and agrees to follow through with his discharge plan.  Musculoskeletal: Strength & Muscle Tone: within normal limits Gait & Station: normal Patient leans: N/A  Psychiatric Specialty Exam  Presentation  General Appearance: Appropriate for Environment; Fairly Groomed  Eye Contact:Fair  Speech:Clear and Coherent  Speech Volume:Normal  Handedness:Right   Mood and Affect  Mood:Euthymic  Duration of Depression Symptoms: Greater than two weeks  Affect:Appropriate   Thought Process  Thought Processes:Coherent  Descriptions of Associations:Intact  Orientation:Full (Time, Place and Person)  Thought Content:Logical; WDL  History of Schizophrenia/Schizoaffective disorder:No  Duration of Psychotic Symptoms:Greater than six months  Hallucinations:Hallucinations: None  Ideas of Reference:None  Suicidal Thoughts:Suicidal Thoughts: No  Homicidal Thoughts:Homicidal Thoughts: No  Sensorium  Memory:Immediate Good; Immediate Poor  Judgment:Good  Insight:Good  Executive Functions  Concentration:Good  Attention Span:Good  Recall:Fair  Fund of Knowledge:Fair  Language:Good  Psychomotor Activity  Psychomotor Activity:Psychomotor Activity: Normal  Assets  Assets:Communication Skills  Sleep  Sleep:Sleep: Good  Physical Exam: Physical Exam Constitutional:      General: He is not in acute distress. HENT:     Head: Normocephalic.     Nose: No congestion or rhinorrhea.     Mouth/Throat:     Pharynx: No oropharyngeal exudate or posterior oropharyngeal erythema.  Eyes:     General:        Right eye: No  discharge.  Left eye: No discharge.  Cardiovascular:     Rate and Rhythm: Normal rate.     Heart sounds: No murmur heard. Pulmonary:     Effort: No respiratory distress.     Breath sounds: No stridor. No wheezing.  Abdominal:     General: There is no distension.  Musculoskeletal:        General: No swelling.     Cervical back: No rigidity.  Skin:    Coloration: Skin is not jaundiced or pale.  Psychiatric:        Mood and Affect: Mood normal.        Behavior: Behavior normal.        Thought Content: Thought content normal.   Review of Systems  Constitutional:  Negative for chills, fever and malaise/fatigue.  HENT:  Negative for congestion, nosebleeds and sore throat.   Respiratory:  Negative for cough, hemoptysis, shortness of breath and wheezing.   Cardiovascular:  Negative for chest pain and palpitations.  Gastrointestinal:  Negative for constipation, diarrhea, heartburn, nausea and vomiting.  Genitourinary:  Negative for frequency.  Musculoskeletal:  Negative for joint pain, myalgias and neck pain.  Skin:  Negative for itching and rash.  Neurological:  Negative for dizziness, tingling, speech change, loss of consciousness, weakness and headaches.  Psychiatric/Behavioral:  Positive for depression (verbalizes improvement with medications) and substance abuse (history of alcohol abuse). Negative for hallucinations and memory loss. The patient is not nervous/anxious and does not have insomnia.   Blood pressure 117/69, pulse 82, temperature 97.7 F (36.5 C), temperature source Oral, resp. rate 16, height 5\' 9"  (1.753 m), weight 73.2 kg, SpO2 96 %. Body mass index is 23.83 kg/m.  Mental Status Per Nursing Assessment::   On Admission:  Suicidal ideation indicated by patient, Suicide plan, Self-harm thoughts, Belief that plan would result in death  Demographic Factors:  Male and Caucasian  Loss Factors: Financial problems/change in socioeconomic status  Historical  Factors: Impulsivity  Risk Reduction Factors:   NA  Continued Clinical Symptoms:  N/A depressive symptoms are improving with medications  Cognitive Features That Contribute To Risk:  None    Suicide Risk:  Minimal: No identifiable suicidal ideation.  Patients presenting with no risk factors but with morbid ruminations; may be classified as minimal risk based on the severity of the depressive symptoms   Follow-up [facility] Follow up.   Specialty: Behavioral Health Why: Please go to this provider for outpatient therapy and medication management services during walk in hours:  Monday through Wednesday, from 7:00 am to 11:00 am.  Services are provided on a first come, first served basis.  Please arrive early. Contact information: [place] [id number] [phone]                Plan Of Care/Follow-up recommendations:  Medications: Continue Zoloft 50 mg PO daily for depression Continue Trazodone 50mg  PO nightly PRN for insomnia  Continue Hydroxyzine PRN for anxiety for anxiety Follow up with [facility] as listed above Follow up with medical Provider for all other medical needs  Prescriptions scripts given at discharge. Patient agreeable to plan.  Given opportunity to ask questions.  Appears to feel comfortable with discharge denies any current suicidal or homicidal thought. Patient is also instructed prior to discharge to: Take all medications as prescribed by his mental healthcare provider. Report any adverse effects and or reactions from the medicines to his outpatient provider promptly.  Patient has been instructed & cautioned: To not engage in alcohol and or illegal drug use while on prescription medicines. In the event of worsening symptoms, patient is instructed to call the mobile crisis hotline [phone], National Suicide prevention Lifeline (279)160-2932, 911 and/or go to  the nearest ED for appropriate evaluation and treatment of symptoms. To follow-up with his primary care provider for your other medical issues, concerns and or health care needs.    Nicholes Rough, NP 09/25/2021, 11:00 AM

## 2021-09-25 NOTE — Group Note (Signed)
[facility] LCSW Group Therapy   09/25/2021 at 1:00pm   Type of Therapy:  Group Therapy: Value Clarification      Participation Level: Did not attend   Description of Group:  In this group, patients will learn to explore, identify, and clarify the values that influence their decisions and behavior and encourages them to build on their inner resources and strengths. This group will be clarifying, exporational and eductional, with patients participating in identifying their own values, identifying others values, and identifying societal values and their impact on their lives.      Therapeutic Goals: Patient will learn to identify values. Patient will learn to identify others values. Patient will learn ways to use their values in decisions they make in their daily lives. Patient will receive support and feedback from others     Therapeutic Modalities:  Acceptance and Commitment Therapy      Summary of Progress/Problems: Did not attend   Ruthann Cancer MSW, LCSW Clincal Social Worker  [facility]

## 2021-09-25 NOTE — BHH Counselor (Signed)
CSW spoke with the Pt who states that he no longer wishes to attend residential substance use treatment.  He states that he has a friend that is willing to set him up with employment and he would like to begin working instead.  The Pt states that he would like to be discharged and take the bus to his friends home.  He states that he is willing to accept a referral to [facility] for therapy and medication management.

## 2021-09-25 NOTE — B[facility] Suicide Risk Assessment (Signed)
[facility] INPATIENT:  Family/Significant Other Suicide Prevention Education  Suicide Prevention Education:  Patient Refusal for Family/Significant Other Suicide Prevention Education: The patient [name] [name] has refused to provide written consent for family/significant other to be provided Family/Significant Other Suicide Prevention Education during admission and/or prior to discharge.  Physician notified.  Metro Kung Raciel Caffrey 09/25/2021, 9:59 AM

## 2021-09-25 NOTE — Progress Notes (Signed)
  [facility] Adult Case Management Discharge Plan :  Will you be returning to the same living situation after discharge:  Yes,  IRC and Shelter At discharge, do you have transportation home?: Yes,  Bus  Do you have the ability to pay for your medications: Yes,  Medicare   Release of information consent forms completed and in the chart;  Patient's signature needed at discharge.  Patient to Follow up at:  Follow-up Information     [facility] Follow up.   Specialty: Behavioral Health Why: Please go to this provider for outpatient therapy and medication management services during walk in hours:  Monday through Wednesday, from 7:00 am to 11:00 am.  Services are provided on a first come, first served basis.  Please arrive early. Contact information: [address] [place] [address] [phone]                Next level of care provider has access to [facility] Link:yes  Safety Planning and Suicide Prevention discussed: Yes,  with patient      Has patient been referred to the Quitline?: Patient refused referral  Patient has been referred for addiction treatment: Pt. refused referral  Aram Beecham, LCSWA 09/25/2021, 9:58 AM

## 2021-09-25 NOTE — BHH Counselor (Signed)
CSW provided the Pt with a packet that contains a list of shelters/housing resources, free and reduced price food resources, clothing resources, PCP services, a [facility] transportation form, bus passes, and suicide prevention information.

## 2021-09-25 NOTE — Group Note (Signed)
Recreation Therapy Group Note   Group Topic:Coping Skills  Group Date: 09/25/2021 Start Time: 0955 End Time: 1030 Facilitators: Caroll Rancher, LRT,CTRS Location: [address] Dayroom   Goal Area(s) Addresses:  Patients will be able to identify positive coping skills. Patients will identify how these coping skills can be incorporated post d/c.  Group Description:  Orthoptist.  Patients were given a drawing of a blank Orthoptist.  Patients were to identify five things or people they felt were holding them back from accomplishing their goals.  Patients were to then identify three coping skills for each setback they identified and how those coping skills would help them progress.   Affect/Mood: Flat   Participation Level: Minimal   Participation Quality: Independent   Behavior: Disinterested   Speech/Thought Process: Distracted   Insight: Fair   Judgement: Fair    Modes of Intervention: Worksheet   Patient Response to Interventions:  Interested    Education Outcome:  Acknowledges education and In group clarification offered    Clinical Observations/Individualized Feedback: Pt was quiet for most of group.  Pt was in and out.  Pt did fill out the sheet but didn't turn it in.  Pt was more focused on discharging.  When pt was in group, he was respectful and listened to peers.    Plan: Continue to engage patient in RT group sessions 2-3x/week.   Caroll Rancher, LRT,CTRS 09/25/2021 12:01 PM

## 2021-10-04 ENCOUNTER — Emergency Department (HOSPITAL_COMMUNITY)
Admission: EM | Admit: 2021-10-04 | Discharge: 2021-10-04 | Discharge status: Against Medical Advice | Payer: Medicare HMO | Source: Home / Self Care

## 2021-10-04 ENCOUNTER — Emergency Department (HOSPITAL_COMMUNITY)
Admission: EM | Admit: 2021-10-04 | Discharge: 2021-10-04 | Discharge status: Against Medical Advice | Payer: Medicare HMO | Attending: Emergency Medicine | Admitting: Emergency Medicine

## 2021-10-04 DIAGNOSIS — Z5321 Procedure and treatment not carried out due to patient leaving prior to being seen by health care provider: Secondary | ICD-10-CM | POA: Diagnosis not present

## 2021-10-04 DIAGNOSIS — R45851 Suicidal ideations: Secondary | ICD-10-CM | POA: Insufficient documentation

## 2021-10-04 DIAGNOSIS — Z046 Encounter for general psychiatric examination, requested by authority: Secondary | ICD-10-CM | POA: Insufficient documentation

## 2021-10-04 NOTE — ED Notes (Signed)
Called pt for a room, no response.

## 2021-10-04 NOTE — ED Notes (Signed)
Final call for pt with no response.

## 2021-10-04 NOTE — ED Notes (Signed)
Called pt for a room x 2 with no response.

## 2021-10-05 ENCOUNTER — Inpatient Hospital Stay (HOSPITAL_COMMUNITY)
Admission: RE | Admit: 2021-10-05 | Discharge: 2021-10-10 | DRG: 897 | Disposition: A | Discharge status: Home / Self Care | Payer: Medicare HMO | Source: Intra-hospital | Attending: Psychiatry | Admitting: Psychiatry

## 2021-10-05 ENCOUNTER — Emergency Department (HOSPITAL_COMMUNITY): Payer: Medicare HMO

## 2021-10-05 ENCOUNTER — Other Ambulatory Visit: Payer: Self-pay

## 2021-10-05 ENCOUNTER — Encounter (HOSPITAL_COMMUNITY): Payer: Self-pay

## 2021-10-05 ENCOUNTER — Encounter (HOSPITAL_COMMUNITY): Payer: Self-pay | Admitting: Emergency Medicine

## 2021-10-05 ENCOUNTER — Emergency Department (HOSPITAL_COMMUNITY)
Admission: EM | Admit: 2021-10-05 | Discharge: 2021-10-05 | Disposition: A | Discharge status: Other Acute Inpatient Hospital | Payer: Medicare HMO | Attending: Emergency Medicine | Admitting: Emergency Medicine

## 2021-10-05 DIAGNOSIS — R569 Unspecified convulsions: Secondary | ICD-10-CM | POA: Diagnosis present

## 2021-10-05 DIAGNOSIS — Z20822 Contact with and (suspected) exposure to covid-19: Secondary | ICD-10-CM | POA: Diagnosis present

## 2021-10-05 DIAGNOSIS — F10932 Alcohol use, unspecified with withdrawal with perceptual disturbance: Secondary | ICD-10-CM | POA: Diagnosis present

## 2021-10-05 DIAGNOSIS — F411 Generalized anxiety disorder: Secondary | ICD-10-CM

## 2021-10-05 DIAGNOSIS — F10939 Alcohol use, unspecified with withdrawal, unspecified: Secondary | ICD-10-CM | POA: Diagnosis present

## 2021-10-05 DIAGNOSIS — R45851 Suicidal ideations: Secondary | ICD-10-CM

## 2021-10-05 DIAGNOSIS — F259 Schizoaffective disorder, unspecified: Secondary | ICD-10-CM | POA: Diagnosis not present

## 2021-10-05 DIAGNOSIS — Z880 Allergy status to penicillin: Secondary | ICD-10-CM | POA: Diagnosis not present

## 2021-10-05 DIAGNOSIS — F1020 Alcohol dependence, uncomplicated: Secondary | ICD-10-CM | POA: Diagnosis not present

## 2021-10-05 DIAGNOSIS — F4310 Post-traumatic stress disorder, unspecified: Secondary | ICD-10-CM | POA: Diagnosis present

## 2021-10-05 DIAGNOSIS — Z046 Encounter for general psychiatric examination, requested by authority: Secondary | ICD-10-CM | POA: Diagnosis present

## 2021-10-05 DIAGNOSIS — Y906 Blood alcohol level of 120-199 mg/100 ml: Secondary | ICD-10-CM | POA: Insufficient documentation

## 2021-10-05 DIAGNOSIS — Z79899 Other long term (current) drug therapy: Secondary | ICD-10-CM | POA: Diagnosis not present

## 2021-10-05 DIAGNOSIS — R079 Chest pain, unspecified: Secondary | ICD-10-CM

## 2021-10-05 DIAGNOSIS — G4700 Insomnia, unspecified: Secondary | ICD-10-CM | POA: Diagnosis present

## 2021-10-05 DIAGNOSIS — F17210 Nicotine dependence, cigarettes, uncomplicated: Secondary | ICD-10-CM | POA: Diagnosis present

## 2021-10-05 DIAGNOSIS — R0789 Other chest pain: Secondary | ICD-10-CM | POA: Insufficient documentation

## 2021-10-05 DIAGNOSIS — F332 Major depressive disorder, recurrent severe without psychotic features: Secondary | ICD-10-CM | POA: Diagnosis present

## 2021-10-05 DIAGNOSIS — F10239 Alcohol dependence with withdrawal, unspecified: Principal | ICD-10-CM | POA: Diagnosis present

## 2021-10-05 LAB — RAPID URINE DRUG SCREEN, HOSP PERFORMED
Amphetamines: NOT DETECTED
Barbiturates: NOT DETECTED
Benzodiazepines: POSITIVE — AB
Cocaine: NOT DETECTED
Opiates: NOT DETECTED
Tetrahydrocannabinol: POSITIVE — AB

## 2021-10-05 LAB — TROPONIN I (HIGH SENSITIVITY)
Troponin I (High Sensitivity): 5 ng/L (ref <18)
Troponin I (High Sensitivity): 4 ng/L (ref <18)

## 2021-10-05 LAB — COMPREHENSIVE METABOLIC PANEL
ALT: 29 U/L (ref 0–44)
AST: 34 U/L (ref 15–41)
Albumin: 4.5 g/dL (ref 3.5–5.0)
Alkaline Phosphatase: 82 U/L (ref 38–126)
Anion gap: 9 (ref 5–15)
BUN: 18 mg/dL (ref 6–20)
CO2: 25 mmol/L (ref 22–32)
Calcium: 8.7 mg/dL — ABNORMAL LOW (ref 8.9–10.3)
Chloride: 104 mmol/L (ref 98–111)
Creatinine, Ser: 0.68 mg/dL (ref 0.61–1.24)
GFR, Estimated: >60 mL/min (ref >60)
Glucose, Bld: 86 mg/dL (ref 70–99)
Potassium: 3.7 mmol/L (ref 3.5–5.1)
Sodium: 138 mmol/L (ref 135–145)
Total Bilirubin: 0.5 mg/dL (ref 0.3–1.2)
Total Protein: 8.2 g/dL — ABNORMAL HIGH (ref 6.5–8.1)

## 2021-10-05 LAB — CBC
HCT: 46 % (ref 39.0–52.0)
Hemoglobin: 15.2 g/dL (ref 13.0–17.0)
MCH: 31.3 pg (ref 26.0–34.0)
MCHC: 33 g/dL (ref 30.0–36.0)
MCV: 94.7 fL (ref 80.0–100.0)
Platelets: 376 10*3/uL (ref 150–400)
RBC: 4.86 MIL/uL (ref 4.22–5.81)
RDW: 14.7 % (ref 11.5–15.5)
WBC: 12.8 10*3/uL — ABNORMAL HIGH (ref 4.0–10.5)
nRBC: 0 % (ref 0.0–0.2)

## 2021-10-05 LAB — RESP PANEL BY RT-PCR (FLU A&B, COVID) ARPGX2
Influenza A by PCR: NEGATIVE
Influenza B by PCR: NEGATIVE
SARS Coronavirus 2 by RT PCR: NEGATIVE

## 2021-10-05 LAB — SALICYLATE LEVEL: Salicylate Lvl: <7 mg/dL — ABNORMAL LOW (ref 7.0–30.0)

## 2021-10-05 LAB — ETHANOL: Alcohol, Ethyl (B): 199 mg/dL — ABNORMAL HIGH (ref <10)

## 2021-10-05 LAB — ACETAMINOPHEN LEVEL: Acetaminophen (Tylenol), Serum: <10 ug/mL — ABNORMAL LOW (ref 10–30)

## 2021-10-05 MED ORDER — ONDANSETRON 4 MG PO TBDP
4.0000 mg | ORAL_TABLET | Freq: Four times a day (QID) | ORAL | Status: AC | PRN
  Administered 2021-10-06 – 2021-10-07 (×4): 4 mg via ORAL
  Filled 2021-10-05 (×4): qty 1

## 2021-10-05 MED ORDER — ADULT MULTIVITAMIN W/MINERALS CH
1.0000 | ORAL_TABLET | Freq: Every day | ORAL | Status: DC
  Administered 2021-10-05 – 2021-10-10 (×6): 1 via ORAL
  Filled 2021-10-05 (×8): qty 1

## 2021-10-05 MED ORDER — LORAZEPAM 2 MG/ML IJ SOLN
0.0000 mg | Freq: Two times a day (BID) | INTRAMUSCULAR | Status: DC
Start: 2021-10-07 — End: 2021-10-05

## 2021-10-05 MED ORDER — CLONIDINE HCL 0.1 MG PO TABS
0.1000 mg | ORAL_TABLET | Freq: Every day | ORAL | Status: DC
  Administered 2021-10-05: 0.1 mg via ORAL
  Filled 2021-10-05 (×2): qty 1

## 2021-10-05 MED ORDER — MAGNESIUM HYDROXIDE 400 MG/5ML PO SUSP
30.0000 mL | Freq: Every day | ORAL | Status: DC | PRN
  Filled 2021-10-05: qty 30

## 2021-10-05 MED ORDER — NICOTINE 21 MG/24HR TD PT24
21.0000 mg | MEDICATED_PATCH | Freq: Every day | TRANSDERMAL | Status: DC
  Administered 2021-10-05 – 2021-10-10 (×6): 21 mg via TRANSDERMAL
  Filled 2021-10-05 (×9): qty 1

## 2021-10-05 MED ORDER — LORAZEPAM 1 MG PO TABS
1.0000 mg | ORAL_TABLET | Freq: Four times a day (QID) | ORAL | Status: DC | PRN
  Administered 2021-10-05 – 2021-10-06 (×2): 1 mg via ORAL
  Filled 2021-10-05 (×2): qty 1

## 2021-10-05 MED ORDER — LORAZEPAM 1 MG PO TABS
0.0000 mg | ORAL_TABLET | Freq: Four times a day (QID) | ORAL | Status: DC
  Administered 2021-10-05: 2 mg via ORAL
  Administered 2021-10-05: 1 mg via ORAL
  Filled 2021-10-05: qty 2
  Filled 2021-10-05: qty 1

## 2021-10-05 MED ORDER — GABAPENTIN 300 MG PO CAPS
300.0000 mg | ORAL_CAPSULE | Freq: Three times a day (TID) | ORAL | Status: DC
  Administered 2021-10-05 – 2021-10-07 (×6): 300 mg via ORAL
  Filled 2021-10-05 (×13): qty 1

## 2021-10-05 MED ORDER — ACETAMINOPHEN 325 MG PO TABS
650.0000 mg | ORAL_TABLET | Freq: Four times a day (QID) | ORAL | Status: DC | PRN
  Administered 2021-10-07: 650 mg via ORAL
  Filled 2021-10-05: qty 2

## 2021-10-05 MED ORDER — NICOTINE 21 MG/24HR TD PT24: 21.0000 mg | MEDICATED_PATCH | Freq: Every day | TRANSDERMAL | Status: DC

## 2021-10-05 MED ORDER — THIAMINE HCL 100 MG PO TABS
100.0000 mg | ORAL_TABLET | Freq: Every day | ORAL | Status: DC
  Administered 2021-10-05: 100 mg via ORAL
  Filled 2021-10-05: qty 1

## 2021-10-05 MED ORDER — HYDROXYZINE HCL 25 MG PO TABS
25.0000 mg | ORAL_TABLET | Freq: Three times a day (TID) | ORAL | Status: DC | PRN
  Administered 2021-10-06 – 2021-10-09 (×5): 25 mg via ORAL
  Filled 2021-10-05 (×6): qty 1

## 2021-10-05 MED ORDER — THIAMINE HCL 100 MG/ML IJ SOLN: 100.0000 mg | Freq: Every day | INTRAMUSCULAR | Status: DC

## 2021-10-05 MED ORDER — LORAZEPAM 1 MG PO TABS: 0.0000 mg | ORAL_TABLET | Freq: Two times a day (BID) | ORAL | Status: DC

## 2021-10-05 MED ORDER — ALUM & MAG HYDROXIDE-SIMETH 200-200-20 MG/5ML PO SUSP: 30.0000 mL | ORAL | Status: DC | PRN

## 2021-10-05 MED ORDER — LORAZEPAM 2 MG/ML IJ SOLN: 0.0000 mg | Freq: Four times a day (QID) | INTRAMUSCULAR | Status: DC

## 2021-10-05 MED ORDER — SERTRALINE HCL 50 MG PO TABS
50.0000 mg | ORAL_TABLET | Freq: Every day | ORAL | Status: DC
  Administered 2021-10-05 – 2021-10-08 (×4): 50 mg via ORAL
  Filled 2021-10-05 (×7): qty 1

## 2021-10-05 MED ORDER — THIAMINE HCL 100 MG PO TABS
100.0000 mg | ORAL_TABLET | Freq: Every day | ORAL | Status: DC
  Administered 2021-10-06 – 2021-10-10 (×5): 100 mg via ORAL
  Filled 2021-10-05 (×7): qty 1

## 2021-10-05 MED ORDER — LOPERAMIDE HCL 2 MG PO CAPS: 2.0000 mg | ORAL_CAPSULE | ORAL | Status: AC | PRN

## 2021-10-05 MED ORDER — TRAZODONE HCL 50 MG PO TABS
50.0000 mg | ORAL_TABLET | Freq: Every evening | ORAL | Status: DC | PRN
  Administered 2021-10-05 – 2021-10-08 (×3): 50 mg via ORAL
  Filled 2021-10-05 (×4): qty 1

## 2021-10-05 NOTE — BH Assessment (Signed)
Comprehensive Clinical Assessment (CCA) Note  10/05/2021 [name] [name] [id number]   Disposition:  Lindon Romp, NP recommened Inpatient. Sent to [facility], [facility] to review bed status. If no beds available disposition SW will seek placement in the morning.  The patient demonstrates the following risk factors for suicide: Chronic risk factors for suicide include: psychiatric disorder of schizophrenia, substance use disorder, and previous suicide attempts via overdose . Acute risk factors for suicide include: recent discharge from inpatient psychiatry. Protective factors for this patient include:  none . Considering these factors, the overall suicide risk at this point appears to be high. Patient is not appropriate for outpatient follow up.   [facility] from 10/05/2021 in [place] DEPT Most recent reading at 10/05/2021  2:28 AM Admission (Discharged) from 09/21/2021 in [place] [facility] Most recent reading at 09/21/2021  5:15 PM ED from 09/21/2021 in [place] DEPT Most recent reading at 09/21/2021 10:27 AM  C-SSRS RISK CATEGORY High Risk High Risk High Risk       Pt is a 45 year old male presenting to the Ed due to Si with plan to shoot himself. Patient reports SI with plan for the past 2 weeks and reports he had access to a weapon at an abandoned house but refused to disclose location. Pt reports drinking alcohol has been his trigger, and reports drinking 6 bottles of wine a day with last drink being today. Pt was intoxicated during consult. Pt reports a hx of schizophrenia and reports a hx of seeing a psychiatrist but reports no current Outpatient treatment. Pt reports multiple IP stays and reports recent ED/Hospital visit.  Pt reports hearing voices telling him to kill himself. Pt denies a hx of self harm and denies any legal issues.  Pt reports he is not able to contract for safety at this  time.  Dispositioned with Lindon Romp, NP recommened Inpatient. Sent to [facility], [facility] to review bed status. If no beds available disposition SW will seek placement in the morning.    Chief Complaint:  Chief Complaint  Patient presents with   Suicidal   Visit Diagnosis: Schizophrenia, unspecified    CCA Screening, Triage and Referral (STR)  Patient Reported Information How did you hear about [place]? Self  What Is the Reason for Your Visit/Call Today? Pt reports to the ED due to si with plan to shoot himelf for the past 2 weeks. Patient reports being intoxicated with drinking 6 bottles of wine and reports last SI with plan was tonight.  How Long Has This Been Causing You Problems? <Week  What Do You Feel Would Help You the Most Today? Alcohol or Drug Use Treatment; Treatment for Depression or other mood problem   Have You Recently Had Any Thoughts About Hurting Yourself? Yes  Are You Planning to Commit Suicide/Harm Yourself At This time? Yes   Have you Recently Had Thoughts About Hurting Someone [name]? No  Are You Planning to Harm Someone at This Time? No  Explanation: No data recorded  Have You Used Any Alcohol or Drugs in the Past 24 Hours? Yes  How Long Ago Did You Use Drugs or Alcohol? No data recorded What Did You Use and How Much? 6 bottles of wine.   Do You Currently Have a Therapist/Psychiatrist? No  Name of Therapist/Psychiatrist: No data recorded  Have You Been Recently Discharged From Any Office Practice or Programs? No  Explanation of Discharge From Practice/Program: Pt has  been asked to leave and escorted out of several facilities per history.     CCA Screening Triage Referral Assessment Type of Contact: Tele-Assessment  Telemedicine Service Delivery:   Is this Initial or Reassessment? Initial Assessment  Date Telepsych consult ordered in [facility]:  10/05/21  Time Telepsych consult ordered in [facility]:  0200  Location of Assessment: [facility] ED  Provider  Location: [facility] Assessment Services   Collateral Involvement: n/a   Does Patient Have a Stage manager Guardian? No data recorded Name and Contact of Legal Guardian: No data recorded If Minor and Not Living with Parent(s), Who has Custody? n/a  Is CPS involved or ever been involved? Never  Is APS involved or ever been involved? Never   Patient Determined To Be At Risk for Harm To Self or Others Based on Review of Patient Reported Information or Presenting Complaint? Yes, for Self-Harm  Method: No data recorded Availability of Means: No data recorded Intent: No data recorded Notification Required: No data recorded Additional Information for Danger to Others Potential: Active psychosis  Additional Comments for Danger to Others Potential: No data recorded Are There Guns or Other Weapons in Your Home? -- (UTA)  Types of Guns/Weapons: No data recorded Are These Weapons Safely Secured?                            No data recorded Who Could Verify You Are Able To Have These Secured: No data recorded Do You Have any Outstanding Charges, Pending Court Dates, Parole/Probation? No data recorded Contacted To Inform of Risk of Harm To Self or Others: Other: Comment (pt is currently in the ED)    Does Patient Present under Involuntary Commitment? No  IVC Papers Initial File Date: No data recorded  [place] of Residence: [place]   Patient Currently Receiving the Following Services: -- (Pt denied)   Determination of Need: Emergent (2 hours)   Options For Referral: Inpatient Hospitalization     CCA Biopsychosocial Patient Reported Schizophrenia/Schizoaffective Diagnosis in Past: Yes   Strengths: pt denies   Mental Health Symptoms Depression:   Change in energy/activity; Fatigue; Irritability; Worthlessness   Duration of Depressive symptoms:  Duration of Depressive Symptoms: Less than two weeks   Mania:   None   Anxiety:    Irritability   Psychosis:    Hallucinations (pt repors hearing voices to kill himself.)   Duration of Psychotic symptoms:  Duration of Psychotic Symptoms: Less than six months   Trauma:   None   Obsessions:   None   Compulsions:   None   Inattention:   None   Hyperactivity/Impulsivity:   None   Oppositional/Defiant Behaviors:   None   Emotional Irregularity:   None   Other Mood/Personality Symptoms:   Pt was was calm and cooperative. Pt appeared to be intoxicated.    Mental Status Exam Appearance and self-care  Stature:   Small   Weight:   Average weight   Clothing:   Disheveled; Dirty (Pt was dressed in hospital scrubs.)   Grooming:   Neglected   Cosmetic use:   None   Posture/gait:   Slumped   Motor activity:   Restless   Sensorium  Attention:   Normal   Concentration:   Normal   Orientation:   X5   Recall/memory:   Defective in Immediate   Affect and Mood  Affect:   Depressed   Mood:   Depressed; Hopeless   Relating  Eye contact:   Normal   Facial expression:   Depressed   Attitude toward examiner:   Cooperative   Thought and Language  Speech flow:  Slurred   Thought content:   Appropriate to Mood and Circumstances   Preoccupation:   Suicide   Hallucinations:   Auditory (pt reports hearing voices to kill himself.)   Organization:  No data recorded  [employer] of Knowledge:   Poor   Intelligence:   Average   Abstraction:   Concrete   Judgement:   Poor   Reality Testing:   Distorted   Insight:   Poor   Decision Making:   Confused   Social Functioning  Social Maturity:   -- (unable to assess)   Social Judgement:   -- (unable to assess.)   Stress  Stressors:   -- (pt reports alcohol)   Coping Ability:   Overwhelmed   Skill Deficits:   Self-care   Supports:   Other (Comment) (pt reports no supports)     Religion: Religion/Spirituality Are You A Religious Person?: Yes What is Your  Religious Affiliation?: [employer] How Might This Affect Treatment?: unknown  Leisure/Recreation: Leisure / Recreation Do You Have Hobbies?: No  Exercise/Diet: Exercise/Diet Do You Exercise?: No Have You Gained or Lost A Significant Amount of Weight in the Past Six Months?: No Do You Follow a Special Diet?: No Do You Have Any Trouble Sleeping?: No Explanation of Sleeping Difficulties: Pt denies   CCA Employment/Education Employment/Work Situation: Employment / Work Situation Employment Situation: On disability Why is Patient on Disability: unable to asses How Long has Patient Been on Disability: unable to asses Patient's Job has Been Impacted by Current Illness: No Has Patient ever Been in the [employer]?: No  Education: Education Is Patient Currently Attending School?: No Did Physicist, medical?: No Did You Have An Individualized Education Program (IIEP): No Did You Have Any Difficulty At School?: No Patient's Education Has Been Impacted by Current Illness: No   CCA Family/Childhood History Family and Relationship History: Family history Marital status: Divorced Separated, when?: n/a Divorced, when?: unknown What types of issues is patient dealing with in the relationship?: pt denies Additional relationship information: n/a Does patient have children?:  (pt refused) How is patient's relationship with their children?: n/a  Childhood History:  Childhood History By whom was/is the patient raised?: Other (Comment) (pt refused) Did patient suffer any verbal/emotional/physical/sexual abuse as a child?: No Did patient suffer from severe childhood neglect?: No Has patient ever been sexually abused/assaulted/raped as an adolescent or adult?: No Was the patient ever a victim of a crime or a disaster?: No Witnessed domestic violence?: No Has patient been affected by domestic violence as an adult?: No  Child/Adolescent Assessment:     CCA Substance  Use Alcohol/Drug Use: Alcohol / Drug Use Pain Medications: pt denies Prescriptions: pt denies Over the Counter: pt denies History of alcohol / drug use?: Yes Longest period of sobriety (when/how long): unknown Negative Consequences of Use: Personal relationships Withdrawal Symptoms: Weakness, DTs, Seizures Onset of Seizures: pt refused to answer Date of most recent seizure: unknown Substance #1 Name of Substance 1: Alcohol 1 - Age of First Use: unknown 1 - Amount (size/oz): 6 bottles of wine 1 - Frequency: daily 1 - Duration: unknown 1 - Last Use / Amount: pt reports last use was today 1 - Method of Aquiring: unknown 1- Route of Use: oral  ASAM's:  Six Dimensions of Multidimensional Assessment  Dimension 1:  Acute Intoxication and/or Withdrawal Potential:      Dimension 2:  Biomedical Conditions and Complications:      Dimension 3:  Emotional, Behavioral, or Cognitive Conditions and Complications:     Dimension 4:  Readiness to Change:     Dimension 5:  Relapse, Continued use, or Continued Problem Potential:     Dimension 6:  Recovery/Living Environment:     ASAM Severity Score:    ASAM Recommended Level of Treatment: ASAM Recommended Level of Treatment: Level III Residential Treatment   Substance use Disorder (SUD) Substance Use Disorder (SUD)  Checklist Symptoms of Substance Use: Continued use despite having a persistent/recurrent physical/psychological problem caused/exacerbated by use, Continued use despite persistent or recurrent social, interpersonal problems, caused or exacerbated by use, Persistent desire or unsuccessful efforts to cut down or control use, Recurrent use that results in a failure to fulfill major role obligations (work, school, home), Substance(s) often taken in larger amounts or over longer times than was intended  Recommendations for Services/Supports/Treatments: Recommendations for  Services/Supports/Treatments Recommendations For Services/Supports/Treatments: Detox, Medication Management, Inpatient Hospitalization  Discharge Disposition: Discharge Disposition Medical Exam completed: Yes Disposition of Patient: Admit Mode of transportation if patient is discharged/movement?: N/A  DSM5 Diagnoses: Patient Active Problem List   Diagnosis Date Noted   Opioid overdose (HCC) 09/23/2021   Cellulitis 09/23/2021   Schizoaffective disorder (HCC) 09/23/2021   Adult antisocial behavior 09/23/2021   MDD (major depressive disorder), recurrent severe, 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   Alcohol-induced mood disorder (HCC)    Chronic midline back pain 06/27/2021   Valproic acid toxicity 06/20/2021   MDD (major depressive disorder), single episode, severe with psychotic features (HCC) 04/04/2021   Malingering 03/31/2021   Altered mental status 06/19/2019   Aspiration pneumonia of right lung (HCC) 06/19/2019   Cannabis use disorder, severe, dependence (HCC) 02/21/2019   Opioid use disorder, severe, in sustained remission, on maintenance therapy (HCC) 07/30/2018   Alcohol use disorder, severe, dependence (HCC)    Major depressive disorder, 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   Episodic mood disorder (HCC) 07/30/2015   ETOH abuse    Cirrhosis (HCC) 05/24/2015   Suicidal ideation    Recurrent cellulitis of lower leg 11/09/2014   Venous (peripheral) insufficiency 11/09/2014   Bipolar affective disorder (HCC)    Alcohol dependence with alcohol-induced mood disorder (HCC)    Opioid dependence with opioid-induced mood disorder (HCC)    MDD (major depressive disorder) 09/09/2014   Suicidal ideations    Chronic pain syndrome 05/31/2014   Tobacco dependence 05/30/2014   Poor dentition 02/03/2014   Hepatitis C virus infection without hepatic coma 02/03/2014   Sepsis (HCC) 01/29/2014   Anxiety state, unspecified 01/31/2013   Mood disorder in conditions  classified elsewhere 01/31/2013   Polysubstance (including opioids) dependence with physiological dependence (HCC) 01/27/2013    Class: Acute   Alcohol withdrawal (HCC) 01/27/2013    Class: Acute     Referrals to Alternative Service(s): Referred to Alternative Service(s):   Place:   Date:   Time:    Referred to Alternative Service(s):   Place:   Date:   Time:    Referred to Alternative Service(s):   Place:   Date:   Time:    Referred to Alternative Service(s):   Place:   Date:   Time:     Hassani Sliney [employer] Domanski, Darcey Nora

## 2021-10-05 NOTE — ED Notes (Signed)
Pt ambulatory to [facility] bed. Pt belonging's bag placed in cabinets: 9-12, behind nursing station.

## 2021-10-05 NOTE — ED Notes (Signed)
Report called to [facility] and given to [facility]. Lanora Manis denied questions or concerns upon dc, pt ambulatory, A&Ox4, in NAD at this time. Safe transport to be called for transportation of pt to [facility].

## 2021-10-05 NOTE — Progress Notes (Signed)
Psychoeducational Group Note  Date:  10/05/2021 Time:  2015  Group Topic/Focus:  Wrap up group  Participation Level: Did Not Attend  Participation Quality:  Not Applicable  Affect:  Not Applicable  Cognitive:  Not Applicable  Insight:  Not Applicable  Engagement in Group: Not Applicable  Additional Comments:  Did not attend.   Marcille Buffy 10/05/2021, 9:15 PM

## 2021-10-05 NOTE — ED Triage Notes (Addendum)
Patient reports suicidal ideation that has been on going x 1 week. Pt reports having a gun and having thoughts of harming himself with it. Pt endorses alcohol dependency and would like help with quitting, last drink 4 hours ago. Pt also reports left sided chest pain on going x 2 days.

## 2021-10-05 NOTE — Progress Notes (Signed)
°   10/05/21 2052  Psych Admission Type (Psych Patients Only)  Admission Status Voluntary  Psychosocial Assessment  Patient Complaints Anxiety;Depression;Substance abuse  Eye Contact Brief  Facial Expression Anxious;Sullen;Sad;Worried  Affect Anxious;Depressed;Sad;Sullen  Speech Logical/coherent  Interaction Assertive  Motor Activity Restless  Appearance/Hygiene Body odor;Disheveled;Poor hygiene  Behavior Characteristics Cooperative  Mood Depressed;Anxious;Irritable  Thought Administrator, sports thinking  Content Ambivalence  Delusions None reported or observed  Perception WDL  Hallucination None reported or observed  Judgment Poor  Confusion None  Danger to Self  Current suicidal ideation? Passive  Self-Injurious Behavior Some self-injurious ideation observed or expressed.  No lethal plan expressed   Agreement Not to Harm Self Yes  Description of Agreement Verbal agreement to not harm himself on the  Danger to Others  Danger to Others None reported or observed

## 2021-10-05 NOTE — ED Provider Notes (Signed)
[facility] DEPT Provider Note   CSN: [id number] Arrival date & time: 10/05/21  0031     History Chief Complaint  Patient presents with   Suicidal    [name] [name] is a 45 y.o. male with a history of bipolar disorder, schizoaffective disorder, poly substance use, EtOH abuse, hepatitis C, depression, cirrhosis, and prior alcohol withdrawal who presents to the ED with complaints of SI recently. Reports he has a plan to shoot himself under the bridge, has not attempted. Reports he is having a hard time stopping drinking, reports he drinks 19 bottles of wine daily, last drinks 4 hours PTA, hx of withdrawal, feeling a bit shaky currently.  Denies HI, hallucinations, or seizures prior to arrival.  Patient also mentions has had chest pain for the past 2 hours, he further more describes this as palpitations with a sensation of his heart racing.  No alleviating or aggravating factors.  He has had a mild cough.  He denies vomiting, diaphoresis, syncope, or dyspnea.  HPI     Past Medical History:  Diagnosis Date   Bipolar disorder ([place])    Cellulitis    Depression    Drug abuse ([place])    Hepatitis C     Patient Active Problem List   Diagnosis Date Noted   Opioid overdose ([place]) 09/23/2021   Cellulitis 09/23/2021   Schizoaffective disorder ([place]) 09/23/2021   Adult antisocial behavior 09/23/2021   MDD (major depressive disorder), recurrent severe, without psychosis ([place]) 09/21/2021   Alcohol-induced mood disorder ([place])    Chronic midline back pain 06/27/2021   Valproic acid toxicity 06/20/2021   MDD (major depressive disorder), single episode, severe with psychotic features ([place]) 04/04/2021   Malingering 03/31/2021   Altered mental status 06/19/2019   Aspiration pneumonia of right lung ([place]) 06/19/2019   Cannabis use disorder, severe, dependence ([place]) 02/21/2019   Opioid use disorder, severe, in sustained remission, on maintenance therapy ([place]) 07/30/2018    Alcohol use disorder, severe, dependence ([place])    Major depressive disorder, recurrent severe without psychotic features ([place]) 12/03/2016   Episodic mood disorder ([place]) 07/30/2015   ETOH abuse    Cirrhosis ([place]) 05/24/2015   Suicidal ideation    Recurrent cellulitis of lower leg 11/09/2014   Venous (peripheral) insufficiency 11/09/2014   Bipolar affective disorder ([place])    Alcohol dependence with alcohol-induced mood disorder ([place])    Opioid dependence with opioid-induced mood disorder (HCC)    MDD (major depressive disorder) 09/09/2014   Suicidal ideations    Chronic pain syndrome 05/31/2014   Tobacco dependence 05/30/2014   Poor dentition 02/03/2014   Hepatitis C virus infection without hepatic coma 02/03/2014   Sepsis ([place]) 01/29/2014   Anxiety state, unspecified 01/31/2013   Mood disorder in conditions classified elsewhere 01/31/2013   Polysubstance (including opioids) dependence with physiological dependence ([place]) 01/27/2013    Class: Acute   Alcohol withdrawal ([place]) 01/27/2013    Class: Acute    Past Surgical History:  Procedure Laterality Date   BACK SURGERY     ROTATOR CUFF REPAIR     SHOULDER SURGERY         Family History  Problem Relation Age of Onset   Alcohol abuse Father    Cancer Maternal Aunt     Social History   Tobacco Use   Smoking status: Every Day    Packs/day: 1.00    Years: 31.00    Pack years: 31.00    Types: Cigarettes    Start date:  10/22/1981   Smokeless tobacco: Never  Vaping Use   Vaping Use: Never used  Substance Use Topics   Alcohol use: Not Currently    Comment: 3 bottles of wine per day   Drug use: Not Currently    Types: Marijuana, Benzodiazepines, Other-see comments, Methamphetamines, Cocaine    Comment: denies    Home Medications Prior to Admission medications   Medication Sig Start Date End Date Taking? Authorizing Provider  cloNIDine (CATAPRES) 0.1 MG tablet Take 1 tablet (0.1 mg total) by mouth at bedtime. For  alcohol withdrawals and hypertension 09/26/21   Starleen Blue, NP  gabapentin (NEURONTIN) 300 MG capsule Take 1 capsule (300 mg total) by mouth 3 (three) times daily. 7 days supply for alcohol withdrawals 09/25/21   Starleen Blue, NP  hydrOXYzine (ATARAX) 25 MG tablet Take 1 tablet (25 mg total) by mouth 3 (three) times daily as needed for anxiety. For anxiety 09/25/21   Starleen Blue, NP  Multiple Vitamin (MULTIVITAMIN WITH MINERALS) TABS tablet Take 1 tablet by mouth daily. 09/26/21   Starleen Blue, NP  nicotine (NICODERM CQ - DOSED IN MG/24 HOURS) 21 mg/24hr patch Place 1 patch (21 mg total) onto the skin daily at 6 (six) AM. Nicotine replacement 09/26/21   Starleen Blue, NP  sertraline (ZOLOFT) 50 MG tablet Take 1 tablet (50 mg total) by mouth daily. For Depression 09/26/21   Starleen Blue, NP  thiamine 100 MG tablet Take 1 tablet (100 mg total) by mouth daily. 09/26/21   Starleen Blue, NP  traZODone (DESYREL) 50 MG tablet Take 1 tablet (50 mg total) by mouth at bedtime as needed for sleep. For sleep 09/25/21   Starleen Blue, NP    Allergies    Lithium and Penicillins  Review of Systems   Review of Systems  Constitutional:  Negative for fever.  Respiratory:  Positive for cough. Negative for shortness of breath.   Cardiovascular:  Positive for chest pain and palpitations.  Gastrointestinal:  Negative for abdominal pain and vomiting.  Neurological:  Positive for tremors. Negative for seizures and syncope.  Psychiatric/Behavioral:  Positive for suicidal ideas. Negative for hallucinations.   All other systems reviewed and are negative.  Physical Exam Updated Vital Signs BP (!) 147/65 (BP Location: Left Arm)    Pulse 81    Temp 97.6 F (36.4 C) (Oral)    Resp 18    Ht 5\' 9"  (1.753 m)    Wt 72.6 kg    SpO2 98%    BMI 23.63 kg/m   Physical Exam Vitals and nursing note reviewed.  Constitutional:      General: He is not in acute distress.    Appearance: He is well-developed. He is not  toxic-appearing.  HENT:     Head: Normocephalic and atraumatic.  Eyes:     General:        Right eye: No discharge.        Left eye: No discharge.     Conjunctiva/sclera: Conjunctivae normal.  Cardiovascular:     Rate and Rhythm: Normal rate and regular rhythm.  Pulmonary:     Effort: Pulmonary effort is normal. No respiratory distress.     Breath sounds: Normal breath sounds. No wheezing, rhonchi or rales.  Abdominal:     General: There is no distension.     Palpations: Abdomen is soft.     Tenderness: There is no guarding or rebound.  Musculoskeletal:     Cervical back: Neck supple.  Skin:    General:  Skin is warm and dry.     Findings: No rash.  Neurological:     Mental Status: He is alert.     Comments: Clear speech.   Psychiatric:        Mood and Affect: Affect is tearful.        Thought Content: Thought content includes suicidal ideation.    ED Results / Procedures / Treatments   Labs (all labs ordered are listed, but only abnormal results are displayed) Labs Reviewed  ETHANOL - Abnormal; Notable for the following components:      Result Value   Alcohol, Ethyl (B) 199 (*)    All other components within normal limits  SALICYLATE LEVEL - Abnormal; Notable for the following components:   Salicylate Lvl Q000111Q (*)    All other components within normal limits  ACETAMINOPHEN LEVEL - Abnormal; Notable for the following components:   Acetaminophen (Tylenol), Serum <10 (*)    All other components within normal limits  CBC - Abnormal; Notable for the following components:   WBC 12.8 (*)    All other components within normal limits  RESP PANEL BY RT-PCR (FLU A&B, COVID) ARPGX2  COMPREHENSIVE METABOLIC PANEL  RAPID URINE DRUG SCREEN, HOSP PERFORMED  TROPONIN I (HIGH SENSITIVITY)    EKG EKG Interpretation  Date/Time:  Thursday October 05 2021 01:19:19 EST Ventricular Rate:  86 PR Interval:  136 QRS Duration: 90 QT Interval:  361 QTC Calculation: 432 R  Axis:   81 Text Interpretation: Sinus rhythm Normal ECG Confirmed by Veryl Speak [phone]) on 10/05/2021 1:28:27 AM  Radiology DG Chest 2 View  Result Date: 10/05/2021 CLINICAL DATA:  Chest pain EXAM: CHEST - 2 VIEW COMPARISON:  10/17/2010 FINDINGS: Cardiac and mediastinal contours are within normal limits. No focal pulmonary opacity. No pleural effusion or pneumothorax. No acute osseous abnormality. IMPRESSION: No acute cardiopulmonary process. Electronically Signed   By: Merilyn Baba M.D.   On: 10/05/2021 01:20    Procedures Procedures   Medications Ordered in ED Medications  LORazepam (ATIVAN) injection 0-4 mg ( Intravenous See Alternative 10/05/21 0131)    Or  LORazepam (ATIVAN) tablet 0-4 mg (2 mg Oral Given 10/05/21 0131)  LORazepam (ATIVAN) injection 0-4 mg (has no administration in time range)    Or  LORazepam (ATIVAN) tablet 0-4 mg (has no administration in time range)  thiamine tablet 100 mg (has no administration in time range)    Or  thiamine (B-1) injection 100 mg (has no administration in time range)    ED Course  I have reviewed the triage vital signs and the nursing notes.  Pertinent labs & imaging results that were available during my care of the patient were reviewed by me and considered in my medical decision making (see chart for details).    MDM Rules/Calculators/A&P                           Patient presents to the ED with complaints of SI, alcohol problems, and chest pain. Nontoxic, vitals with elevated BP, otherwise unremarkable. Initial CIWA 12- placed on protocol.   Additional history obtained:  Additional history obtained from chart review & nursing note review.  3rd ED visit in 24 hours.  EKG: No STEMI.   Lab Tests:  Screening labs have been reviewed including CBC, CMP, acetaminophen/salicylate/ethanol level, UDS: mild leukocytosis felt to be nonspecific. Ethanol levelelevated @ 199. Troponin WNL- flat delta.   Imaging:  I have ordered  imaging  including CXR, reviewed & interpreted imaging- agree with radiologist impression: No acute cardiopulmonary process.  ED Course:  EKG without acute ischemia, troponins flat- doubt ACS.  No hypoxia or tachycardia, low risk wells- doubt PE.  CXR unremarkable.  Abdomen without peritoneal signs.  Resting comfortably on re-assessment.  Patient is medically cleared. Consult placed to TTS. Disposition per [facility]. Placed on CIWA protocol.    The patient has been placed in psychiatric observation due to the need to provide a safe environment for the patient while obtaining psychiatric consultation and evaluation, as well as ongoing medical and medication management to treat the patient's condition.  The patient has not been placed under full IVC at this time at this time as he is voluntary.   Pending home med rec.   Portions of this note were generated with Lobbyist. Dictation errors may occur despite best attempts at proofreading.  Final Clinical Impression(s) / ED Diagnoses Final diagnoses:  Suicidal ideation  Chest pain, unspecified type    Rx / DC Orders ED Discharge Orders     None        Amaryllis Dyke, PA-C 10/05/21 ED:[id number]    Veryl Speak, MD 10/06/21 [phone]

## 2021-10-05 NOTE — ED Notes (Signed)
Pt had been wanded by security. Pt has 2 bag of belongings under ice machine in triage

## 2021-10-05 NOTE — Progress Notes (Signed)
Patient ID: [name] [name], male   DOB: 1975-12-02, 45 y.o.   MRN: [id number] Admission Note  Pt is a 45 yo male that presents voluntarily on 10/05/2021 with worsening anxiety, depression, SI, and substance use/abuse. Pt states they have been drinking 7-8 bottles of wine/day. Pt was recently admitted with the same complaint. Pt c/o suicidal ideations with no plan at this time. Pt contracts for safety. Pt states they want to discharge to [facility] after discharge. Pt denies housing or transportation resources. Pt is a ppd smoker. Pt denies drug use. Pt denies social support besides one friend. Pt denies current si/hi/ah/vh and verbally agrees to approach staff before harming self/others while at bhh. Consents signed, handbook detailing the patient's rights, responsibilities, and visitor guidelines provided. Skin/belongings search completed and patient oriented to unit. Patient stable at this time. Patient given the opportunity to express concerns and ask questions. Patient given toiletries. Will continue to monitor.   [facility] Assessment 12/15:  Pt is a 45 year old male presenting to the Ed due to Si with plan to shoot himself. Patient reports SI with plan for the past 2 weeks and reports he had access to a weapon at an abandoned house but refused to disclose location. Pt reports drinking alcohol has been his trigger, and reports drinking 6 bottles of wine a day with last drink being today. Pt was intoxicated during consult. Pt reports a hx of schizophrenia and reports a hx of seeing a psychiatrist but reports no current Outpatient treatment. Pt reports multiple IP stays and reports recent ED/Hospital visit.  Pt reports hearing voices telling him to kill himself. Pt denies a hx of self harm and denies any legal issues.  Pt reports he is not able to contract for safety at this time.

## 2021-10-05 NOTE — ED Notes (Signed)
Pt ambulated out to Safe transport vehicle for pt transport to [facility]. Paperwork given to safe transport driver. Belongings placed in trunk of vehicle.

## 2021-10-05 NOTE — Tx Team (Signed)
Initial Treatment Plan 10/05/2021 1:16 PM [name] [id number]    PATIENT STRESSORS: Financial difficulties   Health problems   Medication change or noncompliance   Substance abuse     PATIENT STRENGTHS: Ability for insight  Capable of independent living  Motivation for treatment/growth  Religious Affiliation  Supportive family/friends    PATIENT IDENTIFIED PROBLEMS: depression  anxiety  SI  Substance use/abuse               DISCHARGE CRITERIA:  Ability to meet basic life and health needs Improved stabilization in mood, thinking, and/or behavior Motivation to continue treatment in a less acute level of care Reduction of life-threatening or endangering symptoms to within safe limits  PRELIMINARY DISCHARGE PLAN: Attend aftercare/continuing care group Attend 12-step recovery group Placement in alternative living arrangements  PATIENT/FAMILY INVOLVEMENT: This treatment plan has been presented to and reviewed with the patient, TRAYSON STITELY.  The patient and family have been given the opportunity to ask questions and make suggestions.  Raylene Miyamoto, RN 10/05/2021, 1:16 PM

## 2021-10-05 NOTE — BH Assessment (Signed)
BHH Assessment Progress Note   Per Caryn Bee, NP, this pt requires psychiatric hospitalization at this time.  Brook, RN, [facility] has assigned pt to [facility] Rm [id number] to the service of Dr Mason Jim.  Pt has signed Voluntary Admission and Consent for Treatment, as well as Consent to Release Information to no one, and signed forms have been faxed to [facility].  EDP Kristine Royal, MD and pt's nurse, Joice Lofts, have been notified, and Amber agrees to send original paperwork along with pt via Safe Transport, and to call report to [phone].  Doylene Canning, [place] Behavioral Health Coordinator [phone]

## 2021-10-06 ENCOUNTER — Encounter (HOSPITAL_COMMUNITY): Payer: Self-pay

## 2021-10-06 DIAGNOSIS — F411 Generalized anxiety disorder: Secondary | ICD-10-CM

## 2021-10-06 MED ORDER — LORAZEPAM 2 MG/ML IJ SOLN: 2.0000 mg | Freq: Two times a day (BID) | INTRAMUSCULAR | Status: DC | PRN

## 2021-10-06 MED ORDER — CHLORDIAZEPOXIDE HCL 25 MG PO CAPS
25.0000 mg | ORAL_CAPSULE | Freq: Four times a day (QID) | ORAL | Status: DC | PRN
  Administered 2021-10-06 – 2021-10-09 (×7): 25 mg via ORAL
  Filled 2021-10-06 (×7): qty 1

## 2021-10-06 MED ORDER — CLONIDINE HCL 0.1 MG PO TABS
0.1000 mg | ORAL_TABLET | Freq: Two times a day (BID) | ORAL | Status: DC
  Administered 2021-10-06 – 2021-10-10 (×8): 0.1 mg via ORAL
  Filled 2021-10-06 (×12): qty 1

## 2021-10-06 NOTE — BHH Suicide Risk Assessment (Signed)
[facility] Admission Suicide Risk Assessment   Nursing information obtained from:  Patient Demographic factors:  Male, Divorced or widowed, Caucasian, Unemployed, Low socioeconomic status, Living alone Current Mental Status:  Suicidal ideation indicated by patient, Self-harm thoughts Loss Factors:  Decrease in vocational status, Loss of significant relationship, Financial problems / change in socioeconomic status, Decline in physical health Historical Factors:  Prior suicide attempts, Impulsivity, Family history of mental illness or substance abuse Risk Reduction Factors:  Positive social support, Religious beliefs about death  Total Time spent with patient: 20 minutes Principal Problem: Severe recurrent major depression (HCC) Diagnosis:  Principal Problem:   Severe recurrent major depression (HCC) Active Problems:   Alcohol withdrawal (HCC)   Suicidal ideations   Alcohol use disorder, severe, dependence (HCC)   GAD (generalized anxiety disorder)  Subjective Data:  Patient is a 45 year old male with a past psychiatric history of major depressive disorder, anxiety disorder, PTSD, and alcohol use disorder, who was admitted to the psychiatric unit for evaluation and treatment of worsening depression and suicidal thoughts to shoot himself with a gun.   On my evaluation, the patient reports that his mood is down depressed and sad.  He reports feeling hopeless helpless and worthless.  He reports continuing to have suicidal thoughts that are passive without intent or plan.  Denies HI.  He reports coming to the hospital as he was having suicidal thoughts with plan to shoot himself in the head.  He reports he has a gun that is at his friend's house, Ree Kida, [phone], gives permission to contact this person to secure the weapon. He reports history of suicide attempt, and a history of multiple psychiatric hospitalizations. He reports after being discharged from the hospital after last admission, he started  drinking within 1 to 2 hours, and did not feel his psychiatric medications after discharge.  Patient is open to residential treatment, after discharge from the hospital this time. Substance use disorder, unstable housing, and financial problems are major stressors for the patient, that he identifies as causing his worsening depression and suicidal thoughts.  He reports drinking up to 3 L of wine per day, last drink prior to coming to the hospital for psychiatric evaluation.   Continued Clinical Symptoms:  Alcohol Use Disorder Identification Test Final Score (AUDIT): 28 The "Alcohol Use Disorders Identification Test", Guidelines for Use in Primary Care, Second Edition.  World Science writer [facility]). Score between 0-7:  no or low risk or alcohol related problems. Score between 8-15:  moderate risk of alcohol related problems. Score between 16-19:  high risk of alcohol related problems. Score 20 or above:  warrants further diagnostic evaluation for alcohol dependence and treatment.   CLINICAL FACTORS:   Severe Anxiety and/or Agitation Panic Attacks Depression:   Anhedonia Hopelessness Impulsivity Insomnia Alcohol/Substance Abuse/Dependencies More than one psychiatric diagnosis Previous Psychiatric Diagnoses and Treatments   Musculoskeletal: Strength & Muscle Tone: within normal limits Gait & Station: normal Patient leans: N/A  Psychiatric Specialty Exam:  Presentation  General Appearance: Disheveled  Eye Contact:Minimal  Speech:Normal Rate  Speech Volume:Normal  Handedness:Right   Mood and Affect  Mood:Anxious; Depressed; Dysphoric; Hopeless; Worthless  Affect:Constricted   Field seismologist  Descriptions of Associations:Intact  Orientation:Full (Time, Place and Person)  Thought Content:Logical  History of Schizophrenia/Schizoaffective disorder:Yes  Duration of Psychotic Symptoms:Less than six  months  Hallucinations:Hallucinations: Auditory; Tactile; Visual  Ideas of Reference:None  Suicidal Thoughts:Suicidal Thoughts: Yes, Passive SI Passive Intent and/or Plan: Without Intent; Without Plan  Homicidal Thoughts:Homicidal  Thoughts: No   Sensorium  Memory:Immediate Good; Recent Good; Remote Good  Judgment:Poor  Insight:Poor   Executive Functions  Concentration:Poor  Attention Span:Poor  Recall:Good  Fund of Knowledge:Fair  Language:Fair   Psychomotor Activity  Psychomotor Activity:Psychomotor Activity: Restlessness   Assets  Assets:Communication Skills   Sleep  Sleep:Sleep: Poor    Physical Exam: Physical Exam ROS Blood pressure (!) 133/105, pulse (!) 103, temperature (!) 97.5 F (36.4 C), temperature source Oral, resp. rate 18, height 5\' 9"  (1.753 m), weight 69.4 kg, SpO2 99 %. Body mass index is 22.59 kg/m.   COGNITIVE FEATURES THAT CONTRIBUTE TO RISK:  None    SUICIDE RISK:   Moderate:  Frequent suicidal ideation with limited intensity, and duration, some specificity in terms of plans, no associated intent, good self-control, limited dysphoria/symptomatology, some risk factors present, and identifiable protective factors, including available and accessible social support.  PLAN OF CARE:   See assessment and plan in H&P.  I certify that inpatient services furnished can reasonably be expected to improve the patient's condition.   , MD 10/06/2021, 2:30 PM

## 2021-10-06 NOTE — BHH Counselor (Signed)
CSW provided the Pt with an application to the [facility].  The Pt will need to have an ID or Social Security Card prior to being accepted at this facility.  The Pt reports that he does not have any form of ID at this time.  CSW provided the Pt with information that he may obtain an ID at the [facility] or at the [facility] prior to going to [place] to complete the in-person intake for the [facility].  CSW explained to the PT that the [facility] can send the Pt there after getting his ID or CSW can provide the PT with bus tickets if he decides that this is the option for him.  CSW is also sending in referrals to [facility], [facility], and [facility] in [place] for the Pt because these facilities take the [employer].

## 2021-10-06 NOTE — BHH Suicide Risk Assessment (Signed)
BHH INPATIENT:  Family/Significant Other Suicide Prevention Education  Suicide Prevention Education:  Patient Refusal for Family/Significant Other Suicide Prevention Education: The patient [name] [name] has refused to provide written consent for family/significant other to be provided Family/Significant Other Suicide Prevention Education during admission and/or prior to discharge.  Physician notified.  Metro Kung Odilia Damico 10/06/2021, 11:01 AM

## 2021-10-06 NOTE — H&P (Signed)
Psychiatric Admission Assessment Adult  Patient Identification: [name] [name]  MRN:  [id number]  Date of Evaluation:  10/06/2021  Chief Complaint: Worsening alcohol use, intoxication/withdrawal symptoms & suicidal ideations with plan/attempts to shoot himself to death.  Principal Diagnosis: Alcohol use disorder, severe, dependence (HCC)  Diagnosis:  Principal Problem:   Alcohol use disorder, severe, dependence (HCC) Active Problems:   Alcohol withdrawal (HCC)   Suicidal ideations   Severe recurrent major depression (HCC)   GAD (generalized anxiety disorder)  History of Present Illness: This is one of several admission evaluation/assessment for this 45 year old Caucasian male known in this [facility] from his previous hospitalizations & treatments. Chart review indicated that all his hospitalization in this [facility] were related to substance intoxication. [name] [name] is known to be non-adherence to his treatment regimen. He was recently admitted/discharged from this [facility] from 09-21-21 thru 09-25-21 with medications & outpatient referral services. However, patient returned back to the [facility] with similar complaints. His BAL on this present admission was 199 & UDS was positive for benzodiazepine & THC. He was also citing suicidal ideations with plans/attempt to shoot himself to death. During this evaluation, [name] [name] reports,  "I went to the [facility] yesterday because I was having bad alcohol withdrawal symptoms, became suicidal & was going to shoot myself to death. I have been playing russian rollet with my gun for about two weeks. I kept my gun at my friend's house prior to coming to the hospital. I have been drinking heavily since I was discharged from this hospital few days ago after alcohol detox. I would not lie to you, I relapsed almost 1 hour after I got discharged from here. I did not take any of the medicines I was discharged on. I did not go for my follow-up visits. I have been drinking 4 bottle of the  1 litter of chardonnay. I drink to deal with the reality of my alcoholism. I'm an alcoholic. I'm physically dependent on alcohol. When I'm not drinking, I will shake a lot or have seizures. My last seizure activity was a month ago. I also have hx of DTs. I'm seeing some people looking at me in this room, but they are not real. I hear music coming out of the walls & I see rats crawling on the floor. Right now, my skin feels like it is crawling, I have bad tremors, diarrhea & poor appetite. My depression today is #10 & anxiety #10. I have problem sleeping at night. I will need to go to a long term substance abuse treatment after discharge this time. I'm not currently having any suicidal ideations, plans or intent at this time".  Total Time spent with patient: 1 hour  Is the patient at risk to self? No.  Has the patient been a risk to self in the past 6 months? Yes.    Has the patient been a risk to self within the distant past? Yes.    Is the patient a risk to others? No.  Has the patient been a risk to others in the past 6 months? No.  Has the patient been a risk to others within the distant past? No.   Alcohol Screening: 1. How often do you have a drink containing alcohol?: 4 or more times a week 2. How many drinks containing alcohol do you have on a typical day when you are drinking?: 10 or more 3. How often do you have six or more drinks on one occasion?: Daily or almost  daily AUDIT-C Score: 12 4. How often during the last year have you found that you were not able to stop drinking once you had started?: Weekly 5. How often during the last year have you failed to do what was normally expected from you because of drinking?: Weekly 6. How often during the last year have you needed a first drink in the morning to get yourself going after a heavy drinking session?: Weekly 7. How often during the last year have you had a feeling of guilt of remorse after drinking?: Weekly 8. How often during the last  year have you been unable to remember what happened the night before because you had been drinking?: Monthly 9. Have you or someone else been injured as a result of your drinking?: No 10. Has a relative or friend or a doctor or another health worker been concerned about your drinking or suggested you cut down?: Yes, but not in the last year Alcohol Use Disorder Identification Test Final Score (AUDIT): 28 Alcohol Brief Interventions/Follow-up: Alcohol education/Brief advice  Substance Abuse History in the last 12 months:  Yes.    Consequences of Substance Abuse: Discussed witg patient during this admission evaluation. Medical Consequences:  Liver damage, Possible death by overdose Legal Consequences:  Arrests, jail time, Loss of driving privilege. Family Consequences:  Family discord, divorce and or separation.   Previous Psychotropic Medications: Yes   Psychological Evaluations: Yes   Past Medical History:  Past Medical History:  Diagnosis Date   Bipolar disorder (HCC)    Cellulitis    Depression    Drug abuse (HCC)    Hepatitis C     Past Surgical History:  Procedure Laterality Date   BACK SURGERY     ROTATOR CUFF REPAIR     SHOULDER SURGERY     Family History: Family history: Patient denies having family psychiatric history.  Patient denies having family history of suicide attempt. Family History  Problem Relation Age of Onset   Alcohol abuse Father    Cancer Maternal Aunt    Tobacco Screening:   Social History: The patient reports he was born and raised in [place], moved to [place] about 17 years ago with his wife and for work.  He reports he is separated from wife.  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  Reports having 2 children, both girls. Social History   Substance and Sexual Activity  Alcohol Use Yes   Comment: 6-7 bottles of wine per day     Social History   Substance and Sexual Activity  Drug Use Not Currently    Types: Marijuana, Benzodiazepines, Other-see comments, Methamphetamines, Cocaine   Comment: denies    Additional Social History:  Allergies:   Allergies  Allergen Reactions   Lithium Anaphylaxis   Penicillins Rash    Has patient had a PCN reaction causing immediate rash, facial/tongue/throat swelling, SOB or lightheadedness with hypotension: Yes Has patient had a PCN reaction causing severe rash involving mucus membranes or skin necrosis: No Has patient had a PCN reaction that required hospitalization: No Has patient had a PCN reaction occurring within the last 10 years: No If all of the above answers are "NO", then may proceed with Cephalosporin use.    Lab Results:  Results for orders placed or performed during the hospital encounter of 10/05/21 (from the past 48 hour(s))  Resp Panel by RT-PCR (Flu A&B, Covid) Nasopharyngeal Swab     Status: None   Collection  Time: 10/05/21  1:00 AM   Specimen: Nasopharyngeal Swab; Nasopharyngeal(NP) swabs in vial transport medium  Result Value Ref Range   SARS Coronavirus 2 by RT PCR NEGATIVE NEGATIVE    Comment: (NOTE) SARS-CoV-2 target nucleic acids are NOT DETECTED.  The SARS-CoV-2 RNA is generally detectable in upper respiratory specimens during the acute phase of infection. The lowest concentration of SARS-CoV-2 viral copies this assay can detect is 138 copies/mL. A negative result does not preclude SARS-Cov-2 infection and should not be used as the sole basis for treatment or other patient management decisions. A negative result may occur with  improper specimen collection/handling, submission of specimen other than nasopharyngeal swab, presence of viral mutation(s) within the areas targeted by this assay, and inadequate number of viral copies(<138 copies/mL). A negative result must be combined with clinical observations, patient history, and epidemiological information. The expected result is Negative.  Fact Sheet for Patients:   BloggerCourse.com  Fact Sheet for Healthcare Providers:  SeriousBroker.it  This test is no t yet approved or cleared by the [place] FDA and  has been authorized for detection and/or diagnosis of SARS-CoV-2 by FDA under an Emergency Use Authorization (EUA). This EUA will remain  in effect (meaning this test can be used) for the duration of the COVID-19 declaration under Section 564(b)(1) of the Act, 21 U.S.C.section 360bbb-3(b)(1), unless the authorization is terminated  or revoked sooner.       Influenza A by PCR NEGATIVE NEGATIVE   Influenza B by PCR NEGATIVE NEGATIVE    Comment: (NOTE) The Xpert Xpress SARS-CoV-2/FLU/RSV plus assay is intended as an aid in the diagnosis of influenza from Nasopharyngeal swab specimens and should not be used as a sole basis for treatment. Nasal washings and aspirates are unacceptable for Xpert Xpress SARS-CoV-2/FLU/RSV testing.  Fact Sheet for Patients: BloggerCourse.com  Fact Sheet for Healthcare Providers: SeriousBroker.it  This test is not yet approved or cleared by the [place] FDA and has been authorized for detection and/or diagnosis of SARS-CoV-2 by FDA under an Emergency Use Authorization (EUA). This EUA will remain in effect (meaning this test can be used) for the duration of the COVID-19 declaration under Section 564(b)(1) of the Act, 21 U.S.C. section 360bbb-3(b)(1), unless the authorization is terminated or revoked.  Performed at [facility], [address] [place], [place] [address]   Comprehensive metabolic panel     Status: Abnormal   Collection Time: 10/05/21  1:05 AM  Result Value Ref Range   Sodium 138 135 - 145 mmol/L   Potassium 3.7 3.5 - 5.1 mmol/L   Chloride 104 98 - 111 mmol/L   CO2 25 22 - 32 mmol/L   Glucose, Bld 86 70 - 99 mg/dL    Comment: Glucose reference range applies only to  samples taken after fasting for at least 8 hours.   BUN 18 6 - 20 mg/dL   Creatinine, Ser 3.66 0.61 - 1.24 mg/dL   Calcium 8.7 (L) 8.9 - 10.3 mg/dL   Total Protein 8.2 (H) 6.5 - 8.1 g/dL   Albumin 4.5 3.5 - 5.0 g/dL   AST 34 15 - 41 U/L   ALT 29 0 - 44 U/L   Alkaline Phosphatase 82 38 - 126 U/L   Total Bilirubin 0.5 0.3 - 1.2 mg/dL   GFR, Estimated >44 >03 mL/min    Comment: (NOTE) Calculated using the CKD-EPI Creatinine Equation (2021)    Anion gap 9 5 - 15    Comment: Performed at [facility], [address] [place], [place] [address]  Ethanol     Status: Abnormal   Collection Time: 10/05/21  1:05 AM  Result Value Ref Range   Alcohol, Ethyl (B) 199 (H) <10 mg/dL    Comment: (NOTE) Lowest detectable limit for serum alcohol is 10 mg/dL.  For medical purposes only. Performed at [facility], [address] [place], [place] [address]   Salicylate level     Status: Abnormal   Collection Time: 10/05/21  1:05 AM  Result Value Ref Range   Salicylate Lvl <7.0 (L) 7.0 - 30.0 mg/dL    Comment: Performed at [facility], [address], [place], [place] [address]  Acetaminophen level     Status: Abnormal   Collection Time: 10/05/21  1:05 AM  Result Value Ref Range   Acetaminophen (Tylenol), Serum <10 (L) 10 - 30 ug/mL    Comment: (NOTE) Therapeutic concentrations vary significantly. A range of 10-30 ug/mL  may be an effective concentration for many patients. However, some  are best treated at concentrations outside of this range. Acetaminophen concentrations >150 ug/mL at 4 hours after ingestion  and >50 ug/mL at 12 hours after ingestion are often associated with  toxic reactions.  Performed at [facility], [address] [place], [place] [address]   cbc     Status: Abnormal   Collection Time: 10/05/21  1:05 AM  Result Value Ref Range   WBC 12.8 (H) 4.0 - 10.5 K/uL   RBC 4.86 4.22 - 5.81 MIL/uL    Hemoglobin 15.2 13.0 - 17.0 g/dL   HCT 29.5 62.1 - 30.8 %   MCV 94.7 80.0 - 100.0 fL   MCH 31.3 26.0 - 34.0 pg   MCHC 33.0 30.0 - 36.0 g/dL   RDW 65.7 84.6 - 96.2 %   Platelets 376 150 - 400 K/uL   nRBC 0.0 0.0 - 0.2 %    Comment: Performed at [facility], [address], [place], [place] [address]  Troponin I (High Sensitivity)     Status: None   Collection Time: 10/05/21  1:05 AM  Result Value Ref Range   Troponin I (High Sensitivity) 5 <18 ng/L    Comment: (NOTE) Elevated high sensitivity troponin I (hsTnI) values and significant  changes across serial measurements may suggest ACS but many other  chronic and acute conditions are known to elevate hsTnI results.  Refer to the "Links" section for chest pain algorithms and additional  guidance. Performed at [facility], [address] [place], [place] [address]   Rapid urine drug screen (hospital performed)     Status: Abnormal   Collection Time: 10/05/21  2:30 AM  Result Value Ref Range   Opiates NONE DETECTED NONE DETECTED   Cocaine NONE DETECTED NONE DETECTED   Benzodiazepines POSITIVE (A) NONE DETECTED   Amphetamines NONE DETECTED NONE DETECTED   Tetrahydrocannabinol POSITIVE (A) NONE DETECTED   Barbiturates NONE DETECTED NONE DETECTED    Comment: (NOTE) DRUG SCREEN FOR MEDICAL PURPOSES ONLY.  IF CONFIRMATION IS NEEDED FOR ANY PURPOSE, NOTIFY LAB WITHIN 5 DAYS.  LOWEST DETECTABLE LIMITS FOR URINE DRUG SCREEN Drug Class                     Cutoff (ng/mL) Amphetamine and metabolites    1000 Barbiturate and metabolites    200 Benzodiazepine  200 Tricyclics and metabolites     300 Opiates and metabolites        300 Cocaine and metabolites        300 THC                            50 Performed at [facility], [address] [place], [place] [address]   Troponin I (High Sensitivity)     Status: None   Collection Time: 10/05/21  3:30 AM   Result Value Ref Range   Troponin I (High Sensitivity) 4 <18 ng/L    Comment: (NOTE) Elevated high sensitivity troponin I (hsTnI) values and significant  changes across serial measurements may suggest ACS but many other  chronic and acute conditions are known to elevate hsTnI results.  Refer to the "Links" section for chest pain algorithms and additional  guidance. Performed at [facility], [address] [place], [place] [address]    Blood Alcohol level:  Lab Results  Component Value Date   ETH 199 (H) 10/05/2021   ETH 46 (H) 09/21/2021   Metabolic Disorder Labs:  Lab Results  Component Value Date   HGBA1C 5.4 09/22/2021   MPG 108.28 09/22/2021   MPG 117 04/05/2021   No results found for: PROLACTIN Lab Results  Component Value Date   CHOL 143 09/22/2021   TRIG 54 09/22/2021   HDL 66 09/22/2021   CHOLHDL 2.2 09/22/2021   VLDL 11 09/22/2021   LDLCALC 66 09/22/2021   LDLCALC 64 04/05/2021   Current Medications: Current Facility-Administered Medications  Medication Dose Route Frequency Provider Last Rate Last Admin   acetaminophen (TYLENOL) tablet 650 mg  650 mg Oral Q6H PRN [name], NP       alum & mag hydroxide-simeth (MAALOX/MYLANTA) 200-200-20 MG/5ML suspension 30 mL  30 mL Oral Q4H PRN [name], NP       chlordiazePOXIDE (LIBRIUM) capsule 25 mg  25 mg Oral Q6H PRN Armandina Stammer I, NP   25 mg at 10/06/21 1353   cloNIDine (CATAPRES) tablet 0.1 mg  0.1 mg Oral BID Massengill, Harrold Donath, MD   0.1 mg at 10/06/21 1052   gabapentin (NEURONTIN) capsule 300 mg  300 mg Oral TID Nira Conn A, NP   300 mg at 10/06/21 1300   hydrOXYzine (ATARAX) tablet 25 mg  25 mg Oral TID PRN [name], NP   25 mg at 10/06/21 1322   loperamide (IMODIUM) capsule 2-4 mg  2-4 mg Oral PRN [name], NP       LORazepam (ATIVAN) injection 2 mg  2 mg Intramuscular BID PRN Massengill, Harrold Donath, MD       magnesium hydroxide (MILK OF MAGNESIA) suspension 30 mL  30  mL Oral Daily PRN Nira Conn A, NP       multivitamin with minerals tablet 1 tablet  1 tablet Oral Daily Nira Conn A, NP   1 tablet at 10/06/21 0820   nicotine (NICODERM CQ - dosed in mg/24 hours) patch 21 mg  21 mg Transdermal Daily Mason Jim, Amy E, MD   21 mg at 10/06/21 0818   ondansetron (ZOFRAN-ODT) disintegrating tablet 4 mg  4 mg Oral Q6H PRN Nira Conn A, NP   4 mg at 10/06/21 0951   sertraline (ZOLOFT) tablet 50 mg  50 mg Oral Daily Nira Conn A, NP   50 mg at 10/06/21 0820   thiamine tablet 100 mg  100 mg Oral Daily Nira Conn A, NP   100 mg at 10/06/21 0820   traZODone (DESYREL) tablet 50 mg  50 mg Oral QHS PRN [name], NP   50 mg at 10/05/21 2052   PTA Medications: Medications Prior to Admission  Medication Sig Dispense Refill Last Dose   cloNIDine (CATAPRES) 0.1 MG tablet Take 1 tablet (0.1 mg total) by mouth at bedtime. For alcohol withdrawals and hypertension (Patient not taking: Reported on 10/05/2021) 30 tablet 0    gabapentin (NEURONTIN) 300 MG capsule Take 1 capsule (300 mg total) by mouth 3 (three) times daily. 7 days supply for alcohol withdrawals (Patient not taking: Reported on 10/05/2021) 21 capsule 0    hydrOXYzine (ATARAX) 25 MG tablet Take 1 tablet (25 mg total) by mouth 3 (three) times daily as needed for anxiety. For anxiety (Patient not taking: Reported on 10/05/2021) 30 tablet 0    Multiple Vitamin (MULTIVITAMIN WITH MINERALS) TABS tablet Take 1 tablet by mouth daily. (Patient not taking: Reported on 10/05/2021)      nicotine (NICODERM CQ - DOSED IN MG/24 HOURS) 21 mg/24hr patch Place 1 patch (21 mg total) onto the skin daily at 6 (six) AM. Nicotine replacement (Patient not taking: Reported on 10/05/2021) 1 patch 0    sertraline (ZOLOFT) 50 MG tablet Take 1 tablet (50 mg total) by mouth daily. For Depression (Patient not taking: Reported on 10/05/2021) 30 tablet 0    thiamine 100 MG tablet Take 1 tablet (100 mg total) by mouth daily. (Patient not  taking: Reported on 10/05/2021)      traZODone (DESYREL) 50 MG tablet Take 1 tablet (50 mg total) by mouth at bedtime as needed for sleep. For sleep (Patient not taking: Reported on 10/05/2021) 30 tablet 0    Musculoskeletal: Strength & Muscle Tone: within normal limits Gait & Station: normal Patient leans: N/A  Psychiatric Specialty Exam:  Presentation  General Appearance: Disheveled  Eye Contact:Minimal  Speech:Normal Rate  Speech Volume:Normal  Handedness:Right  Mood and Affect  Mood:Anxious; Depressed; Dysphoric; Hopeless; Worthless  Affect:Constricted  Art gallery manager Processes:Linear  Duration of Psychotic Symptoms: Less than six months  Past Diagnosis of Schizophrenia or Psychoactive disorder: Yes  Descriptions of Associations:Intact  Orientation:Full (Time, Place and Person)  Thought Content:Logical  Hallucinations:Hallucinations: Auditory; Tactile; Visual  Ideas of Reference:None  Suicidal Thoughts:Suicidal Thoughts: Yes, Passive SI Passive Intent and/or Plan: Without Intent; Without Plan  Homicidal Thoughts:Homicidal Thoughts: No  Sensorium  Memory:Immediate Good; Recent Good; Remote Good  Judgment:Poor  Insight:Poor  Executive Functions  Concentration:Poor  Attention Span:Poor  Recall:Good  Fund of Knowledge:Fair  Language:Fair  Psychomotor Activity  Psychomotor Activity:Psychomotor Activity: Restlessness  Assets  Assets:Communication Skills  Sleep  Sleep:Sleep: Poor  Physical Exam: Physical Exam Vitals reviewed.  Constitutional:      General: He is in acute distress.     Appearance: He is normal weight. He is ill-appearing and toxic-appearing.  HENT:     Head: Normocephalic.     Nose: Nose normal.     Mouth/Throat:     Pharynx: Oropharynx is clear.  Eyes:     Pupils: Pupils are equal, round, and reactive to light.  Cardiovascular:     Rate and Rhythm: Normal rate.     Pulses: Normal pulses.  Pulmonary:      Effort: Pulmonary effort is normal. No respiratory distress.  Genitourinary:    Comments: Deferred Musculoskeletal:        General: Normal range of motion.  Cervical back: Normal range of motion.  Skin:    General: Skin is warm and dry.  Neurological:     General: No focal deficit present.     Mental Status: He is alert and oriented to person, place, and time.   Review of Systems  Constitutional:  Negative for chills, diaphoresis, fever and malaise/fatigue.  HENT:  Negative for congestion and sore throat.   Eyes:  Negative for blurred vision.  Respiratory:  Negative for cough, shortness of breath and wheezing.   Cardiovascular:  Negative for chest pain and palpitations.  Gastrointestinal:  Positive for diarrhea. Negative for abdominal pain, heartburn, nausea and vomiting.  Genitourinary:  Negative for dysuria.  Musculoskeletal:  Negative for joint pain and myalgias.  Neurological:  Positive for tremors. Negative for dizziness, tingling, sensory change, speech change, focal weakness, seizures (Hx of alcohol withdrawal seizures.), weakness and headaches.  Endo/Heme/Allergies:        Allergies: Lithium, PCN  Psychiatric/Behavioral:  Positive for depression, hallucinations and substance abuse (BAL 199, UDS (+) for benzo & THC). Negative for memory loss and suicidal ideas. The patient is nervous/anxious and has insomnia.   All other systems reviewed and are negative.  Blood pressure 125/72, pulse 89, temperature (!) 97.5 F (36.4 C), temperature source Oral, resp. rate 18, height 5\' 9"  (1.753 m), weight 69.4 kg, SpO2 99 %. Body mass index is 22.59 kg/m.  Treatment Plan Summary: Daily contact with patient to assess and evaluate symptoms and progress in treatment and Medication management.   Treatment Plan/Recommendations:  1. Admit for crisis management and stabilization, estimated length of stay 3-5 days.   Diagnoses / Active Problems: Alcohol use disorder, severe Major  depressive disorder, recurrent, severe, without psychotic features Generalized anxiety disorder PTSD  2. Medication management to reduce current symptoms to base line and improve the patient's overall level of functioning: See [facility] for plan of care.  Continue Librium 25 mg po Q 6 hrs prn for CIWA > 10.  Continue gabapentin 300 mg po tid for agitation/withdrawal symptoms.  Continue Lorazepam 2 mg IM twice daily prn for withdrawal seizures. Continue Sertraline 50 mg po daily for depression. Continue all other adjunct medications for the management of associated withdrawal symptoms.  Continue Trazodone 50 mg po Q hs prn for insomnia. Continue Nicotine patch 21 mg trans-dermally for nicotine withdrawal sx.  Continue other prn medications for other medical symptoms.  3. Treat health problems as indicated.   4. Develop treatment plan to decrease risk of relapse upon discharge and the need for readmission.   5. Psycho-social education regarding relapse prevention and self care.   6. Health care follow up as needed for medical problems.   7. Review, reconcile, and reinstate any pertinent home medications for other health issues where appropriate. 8. Call for consults with hospitalist for any additional specialty patient care services as needed.    Observation Level/Precautions:  15 minute checks  Laboratory: Per ED, current lab results reviewed; BAL 199, UDS (+) for Co/THC.  Psychotherapy: Supportive and group therapy  Medications: See [facility] for list of medications.   Consultations: As needed.  Discharge Concerns: Safety, mood stability.  Estimated LOS: 5 to 7 days  Other: Admit to the 400-hall.   Physician Treatment Plan for Primary Diagnosis: Alcohol use disorder, severe, dependence (HCC).  Short Term Goals: Ability to identify changes in lifestyle to reduce recurrence of condition will improve, Ability to verbalize feelings will improve, and Ability to disclose and discuss suicidal  ideas  Long  Term Goal(s): Improvement in symptoms so as ready for discharge  Short Term Goals: Ability to demonstrate self-control will improve, Ability to identify and develop effective coping behaviors will improve, Compliance with prescribed medications will improve, and Ability to identify triggers associated with substance abuse/mental health issues will improve  I certify that inpatient services furnished can reasonably be expected to improve the patient's condition.    Armandina Stammer, NP, pmhnp, fnp-bc 12/16/20223:11 PM

## 2021-10-06 NOTE — Progress Notes (Signed)
[name] [name] has been complaining of withdrawal symptoms.  Tremor, anxiety and pins and needle feelings.  CIWA 11 and prn lorazepam given with good relief.  He continued to voice SI but verbally agrees to not harm himself on the unit.  He is currently resting with his eyes open and appears to be asleep.  Q 15 minute checks maintained for safety.

## 2021-10-06 NOTE — Group Note (Signed)
LCSW Group Therapy Note   Group Date: 10/06/2021 Start Time: 0945 End Time: 1045  Type of Therapy and Topic:  Group Therapy:  Setting Goals   Participation Level:  None   Description of Group: In this process group, patients discussed using strengths to work toward goals and address challenges.  Patients identified two positive things about themselves and one goal they were working on.  Patients were given the opportunity to share openly and support each other's plan for self-empowerment.  The group discussed the value of gratitude and were encouraged to have a daily reflection of positive characteristics or circumstances.  Patients were encouraged to identify a plan to utilize their strengths to work on current challenges and goals.   Therapeutic Goals Patient will verbalize personal strengths/positive qualities and relate how these can assist with achieving desired personal goals Patients will verbalize affirmation of peers plans for personal change and goal setting Patients will explore the value of gratitude and positive focus as related to successful achievement of goals Patients will verbalize a plan for regular reinforcement of personal positive qualities and circumstances.   Summary of Patient Progress: Patient came to the group room but left before the group began. He did accept the worksheet that was provided.      Therapeutic Modalities Cognitive Behavioral Therapy Motivational Interviewing  Aram Beecham, [place] 10/06/2021  11:11 AM

## 2021-10-06 NOTE — BH IP Treatment Plan (Signed)
Interdisciplinary Treatment and Diagnostic Plan Update  10/06/2021 PHYLLIP CLAW MRN: [id number]  Principal Diagnosis: Alcohol use disorder, severe, dependence ([place])  Secondary Diagnoses: Principal Problem:   Alcohol use disorder, severe, dependence ([place]) Active Problems:   Alcohol withdrawal ([place])   Suicidal ideations   Severe recurrent major depression ([place])   GAD (generalized anxiety disorder)   Current Medications:  Current Facility-Administered Medications  Medication Dose Route Frequency Provider Last Rate Last Admin   acetaminophen (TYLENOL) tablet 650 mg  650 mg Oral Q6H PRN Rozetta Nunnery, NP       alum & mag hydroxide-simeth (MAALOX/MYLANTA) 200-200-20 MG/5ML suspension 30 mL  30 mL Oral Q4H PRN Rozetta Nunnery, NP       chlordiazePOXIDE (LIBRIUM) capsule 25 mg  25 mg Oral Q6H PRN Lindell Spar I, NP   25 mg at 10/06/21 1353   cloNIDine (CATAPRES) tablet 0.1 mg  0.1 mg Oral BID Massengill, Ovid Curd, MD   0.1 mg at 10/06/21 1052   gabapentin (NEURONTIN) capsule 300 mg  300 mg Oral TID Rozetta Nunnery, NP   300 mg at 10/06/21 1300   hydrOXYzine (ATARAX) tablet 25 mg  25 mg Oral TID PRN Rozetta Nunnery, NP   25 mg at 10/06/21 1322   loperamide (IMODIUM) capsule 2-4 mg  2-4 mg Oral PRN Rozetta Nunnery, NP       LORazepam (ATIVAN) injection 2 mg  2 mg Intramuscular BID PRN Massengill, Ovid Curd, MD       magnesium hydroxide (MILK OF MAGNESIA) suspension 30 mL  30 mL Oral Daily PRN Rozetta Nunnery, NP       multivitamin with minerals tablet 1 tablet  1 tablet Oral Daily Lindon Romp A, NP   1 tablet at 10/06/21 0820   nicotine (NICODERM CQ - dosed in mg/24 hours) patch 21 mg  21 mg Transdermal Daily Nelda Marseille, Amy E, MD   21 mg at 10/06/21 0818   ondansetron (ZOFRAN-ODT) disintegrating tablet 4 mg  4 mg Oral Q6H PRN Lindon Romp A, NP   4 mg at 10/06/21 0951   sertraline (ZOLOFT) tablet 50 mg  50 mg Oral Daily Lindon Romp A, NP   50 mg at 10/06/21 0820   thiamine tablet 100 mg  100 mg Oral  Daily Lindon Romp A, NP   100 mg at 10/06/21 0820   traZODone (DESYREL) tablet 50 mg  50 mg Oral QHS PRN Rozetta Nunnery, NP   50 mg at 10/05/21 2052   PTA Medications: Medications Prior to Admission  Medication Sig Dispense Refill Last Dose   cloNIDine (CATAPRES) 0.1 MG tablet Take 1 tablet (0.1 mg total) by mouth at bedtime. For alcohol withdrawals and hypertension (Patient not taking: Reported on 10/05/2021) 30 tablet 0    gabapentin (NEURONTIN) 300 MG capsule Take 1 capsule (300 mg total) by mouth 3 (three) times daily. 7 days supply for alcohol withdrawals (Patient not taking: Reported on 10/05/2021) 21 capsule 0    hydrOXYzine (ATARAX) 25 MG tablet Take 1 tablet (25 mg total) by mouth 3 (three) times daily as needed for anxiety. For anxiety (Patient not taking: Reported on 10/05/2021) 30 tablet 0    Multiple Vitamin (MULTIVITAMIN WITH MINERALS) TABS tablet Take 1 tablet by mouth daily. (Patient not taking: Reported on 10/05/2021)      nicotine (NICODERM CQ - DOSED IN MG/24 HOURS) 21 mg/24hr patch Place 1 patch (21 mg total) onto the skin daily at 6 (six) AM. Nicotine replacement (Patient  not taking: Reported on 10/05/2021) 1 patch 0    sertraline (ZOLOFT) 50 MG tablet Take 1 tablet (50 mg total) by mouth daily. For Depression (Patient not taking: Reported on 10/05/2021) 30 tablet 0    thiamine 100 MG tablet Take 1 tablet (100 mg total) by mouth daily. (Patient not taking: Reported on 10/05/2021)      traZODone (DESYREL) 50 MG tablet Take 1 tablet (50 mg total) by mouth at bedtime as needed for sleep. For sleep (Patient not taking: Reported on 10/05/2021) 30 tablet 0     Patient Stressors: Financial difficulties   Health problems   Medication change or noncompliance   Substance abuse    Patient Strengths: Ability for insight  Capable of independent living  Motivation for treatment/growth  Religious Affiliation  Supportive family/friends   Treatment Modalities: Medication Management,  Group therapy, Case management,  1 to 1 session with clinician, Psychoeducation, Recreational therapy.   Physician Treatment Plan for Primary Diagnosis: Alcohol use disorder, severe, dependence ([place]) Long Term Goal(s):     Short Term Goals:    Medication Management: Evaluate patient's response, side effects, and tolerance of medication regimen.  Therapeutic Interventions: 1 to 1 sessions, Unit Group sessions and Medication administration.  Evaluation of Outcomes: Not Met  Physician Treatment Plan for Secondary Diagnosis: Principal Problem:   Alcohol use disorder, severe, dependence ([place]) Active Problems:   Alcohol withdrawal ([place])   Suicidal ideations   Severe recurrent major depression ([place])   GAD (generalized anxiety disorder)  Long Term Goal(s):     Short Term Goals:       Medication Management: Evaluate patient's response, side effects, and tolerance of medication regimen.  Therapeutic Interventions: 1 to 1 sessions, Unit Group sessions and Medication administration.  Evaluation of Outcomes: Not Met   RN Treatment Plan for Primary Diagnosis: Alcohol use disorder, severe, dependence ([place]) Long Term Goal(s): Knowledge of disease and therapeutic regimen to maintain health will improve  Short Term Goals: Ability to verbalize feelings will improve, Ability to identify and develop effective coping behaviors will improve, and Compliance with prescribed medications will improve  Medication Management: RN will administer medications as ordered by provider, will assess and evaluate patient's response and provide education to patient for prescribed medication. RN will report any adverse and/or side effects to prescribing provider.  Therapeutic Interventions: 1 on 1 counseling sessions, Psychoeducation, Medication administration, Evaluate responses to treatment, Monitor vital signs and CBGs as ordered, Perform/monitor CIWA, COWS, AIMS and Fall Risk screenings as ordered, Perform  wound care treatments as ordered.  Evaluation of Outcomes: Not Met   LCSW Treatment Plan for Primary Diagnosis: Alcohol use disorder, severe, dependence ([place]) Long Term Goal(s): Safe transition to appropriate next level of care at discharge, Engage patient in therapeutic group addressing interpersonal concerns.  Short Term Goals: Engage patient in aftercare planning with referrals and resources, Increase social support, and Increase ability to appropriately verbalize feelings  Therapeutic Interventions: Assess for all discharge needs, 1 to 1 time with Social worker, Explore available resources and support systems, Assess for adequacy in community support network, Educate family and significant other(s) on suicide prevention, Complete Psychosocial Assessment, Interpersonal group therapy.  Evaluation of Outcomes: Not Met   Progress in Treatment: Attending groups: Yes. Participating in groups: No. Taking medication as prescribed: Yes. Toleration medication: Yes. Family/Significant other contact made: Yes, individual(s) contacted:  pt declined consents Patient understands diagnosis: Yes. Discussing patient identified problems/goals with staff: Yes. Medical problems stabilized or resolved: Yes. Denies suicidal/homicidal ideation: Yes.  Issues/concerns per patient self-inventory: No. Other: None  New problem(s) identified: No, Describe:  None  New Short Term/Long Term Goal(s):medication stabilization, elimination of SI thoughts, development of comprehensive mental wellness plan.  Patient Goals:  "go to treatment"  Discharge Plan or Barriers: Patient recently admitted. CSW will continue to follow and assess for appropriate referrals and possible discharge planning.   Reason for Continuation of Hospitalization: Medication stabilization Suicidal ideation Withdrawal symptoms  Estimated Length of Stay: 3-5 days   Scribe for Treatment Team: Eliott Nine 10/06/2021 3:03  PM

## 2021-10-06 NOTE — BHH Counselor (Signed)
CSW provided the Pt with a packet that contains a list of shelters/housing resources, free and reduced price food resources, clothing resources, PCP services, and suicide prevention information.
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
## 2021-10-07 MED ORDER — OLANZAPINE 10 MG PO TBDP
ORAL_TABLET | ORAL | Status: AC
  Administered 2021-10-07: 5 mg
  Filled 2021-10-07: qty 1

## 2021-10-07 MED ORDER — OLANZAPINE 10 MG PO TBDP
10.0000 mg | ORAL_TABLET | Freq: Once | ORAL | Status: AC
  Administered 2021-10-07: 10 mg via ORAL
  Filled 2021-10-07: qty 1

## 2021-10-07 MED ORDER — GABAPENTIN 400 MG PO CAPS
400.0000 mg | ORAL_CAPSULE | Freq: Three times a day (TID) | ORAL | Status: DC
  Administered 2021-10-07 – 2021-10-10 (×10): 400 mg via ORAL
  Filled 2021-10-07 (×16): qty 1

## 2021-10-07 MED ORDER — OLANZAPINE 5 MG PO TBDP
5.0000 mg | ORAL_TABLET | Freq: Four times a day (QID) | ORAL | Status: DC | PRN
  Administered 2021-10-08 – 2021-10-10 (×3): 5 mg via ORAL
  Filled 2021-10-07 (×5): qty 1

## 2021-10-07 NOTE — Progress Notes (Signed)
D) Pt received visible, participating in milieu, requesting medication for w/drawl symptoms. Pt A & O x4. Pt denies SI, HI, A/ V H, depression,  and pain at this time. A) Pt encouraged to drink fluids. Pt encouraged to come to staff with needs. Pt encouraged to attend and participate in groups. Pt encouraged to set reachable goals. Pt encouraged to attempt planning for when offered drink again, pt endorsed considering signing the 72 hour intent to terminate care. Pt educated that, due to the hour, the clock would not begin until Monday morning, and pt agreed that he would get discharged faster by completing the program, and pt took PO medication to help with symptoms and went to bed. R) Pt remained safe on unit, in no acute distress, will continue to assess.      10/06/21 1930  Psych Admission Type (Psych Patients Only)  Admission Status Voluntary  Psychosocial Assessment  Patient Complaints Substance abuse  Eye Contact Brief  Facial Expression Anxious;Pinched  Affect Anxious;Apprehensive;Irritable  Speech Press photographer;Restless  Appearance/Hygiene Body odor;Disheveled;Poor hygiene;In scrubs  Behavior Characteristics Cooperative;Irritable  Mood Anxious;Preoccupied  Thought Process  Coherency Concrete thinking  Content Preoccupation  Delusions None reported or observed  Perception WDL  Hallucination None reported or observed  Judgment Poor  Confusion None  Danger to Self  Current suicidal ideation? Passive  Self-Injurious Behavior No self-injurious ideation or behavior indicators observed or expressed   Agreement Not to Harm Self Yes  Description of Agreement verbal  Danger to Others  Danger to Others None reported or observed

## 2021-10-07 NOTE — Progress Notes (Signed)
°  Psychoeducational Group Note  Date:  10/07/2021 Time:  2015  Group Topic/Focus:  Wrap up group  Participation Level: Did Not Attend  Participation Quality:  Not Applicable  Affect:  Not Applicable  Cognitive:  Not Applicable  Insight:  Not Applicable  Engagement in Group: Not Applicable  Additional Comments:  Did not attend.   Marcille Buffy 10/07/2021, 11:24 PM

## 2021-10-07 NOTE — Progress Notes (Signed)
Pt complained of "detoxing" Ciwa 14. gave 25 mg librium, 650mg  Tylenol for HA, 4mg  of Zofran for Nausea.

## 2021-10-07 NOTE — BHH Group Notes (Signed)
Psychoeducational Group Note    Date:12/17//2022 Time: 1300-1400    Purpose of Group: . The group focus' on teaching patients on how to identify their needs and their Life Skills:  A group where two lists are made. What people need and what are things that we do that are unhealthy. The lists are developed by the patients and it is explained that we often do the actions that are not healthy to get our list of needs met.  Goal:: to develop the coping skills needed to get their needs met  Participation Level:  pt walked in the room , got a cup of coffee and walked out again. Did not return  [name] [name]

## 2021-10-07 NOTE — Group Note (Signed)
LCSW Group Therapy Note  Group Date: 10/07/2021 Start Time: 1000 End Time: 1100   Type of Therapy and Topic:  Group Therapy: Anger Cues and Responses  Participation Level:  Minimal   Description of Group:   In this group, patients learned how to recognize the physical, cognitive, emotional, and behavioral responses they have to anger-provoking situations.  They identified a recent time they became angry and how they reacted.  They analyzed how their reaction was possibly beneficial and how it was possibly unhelpful.  The group discussed a variety of healthier coping skills that could help with such a situation in the future.  Focus was placed on how helpful it is to recognize the underlying emotions to our anger, because working on those can lead to a more permanent solution as well as our ability to focus on the important rather than the urgent.  Therapeutic Goals: Patients will remember their last incident of anger and how they felt emotionally and physically, what their thoughts were at the time, and how they behaved. Patients will identify how their behavior at that time worked for them, as well as how it worked against them. Patients will explore possible new behaviors to use in future anger situations. Patients will learn that anger itself is normal and cannot be eliminated, and that healthier reactions can assist with resolving conflict rather than worsening situations.  Summary of Patient Progress:  [name] [name] was minimally active during the introductory phase of group. He shared a recent occurrence wherein feeling he is not getting the correct medicine for his detox has led to anger. He demonstrated no insight into the subject matter and left in the middle of group.  Therapeutic Modalities:   Cognitive Behavioral Therapy    Burnard Bunting 10/07/2021  2:51 PM

## 2021-10-07 NOTE — Progress Notes (Signed)
[facility] MD Progress Note  10/07/2021 4:04 PM [name] [name]  MRN:  [id number]  Subjective: [name] [name] reports, "[name] feel worse. [name] have been seeing rats crawling on the floor. My mood is depressed & [name] feel psychotic. [name]'m shaking real bad. The Librium is not strong enough for my withdrawal symptoms. [name] may be going to [name] substance abuse treatment program in [place], [place] after discharge. [name] need it or [name]'m going to die from drinking too much". Daily notes: [name] [name] is seen, chart reviewed. The chart findings discussed with the treatment team. He presents alert, oriented x 4. He is visible on the unit attending group sessions. He says he is still feeling very depressed, shaky & psychotic (seeing rats crawling on the floor, but denies hearing voices. He says he may be going to [name] substance abuse treatment program in [place], [place] after discharge to continue substance abuse treatment.  He is encouraged to continue to take his medications as recommended to help stabilize his symptoms. His gabapentin is increased to 400 mg tid to help with alcohol withdrawal syndrome. He denies any side effects from his medications. He is eating & sleeping well.  Reason for admission: 45 year old Caucasian male known in this [facility] from his previous hospitalizations & treatments. Chart review indicated that all his hospitalization in this [facility] were related to substance intoxication. [name] [name] is known to be non-adherence to his treatment regimen. He was recently admitted/discharged from this [facility] from 09-21-21 thru 09-25-21 with medications & outpatient referral services. However, patient returned back to the [facility] with similar complaints. His BAL on this present admission was 199 & UDS was positive for benzodiazepine & THC. He was also citing suicidal ideations with plans/attempt to shoot himself to death.  Principal Problem: Alcohol use disorder, severe, dependence (HCC)  Diagnosis: Principal Problem:   Alcohol use disorder, severe, dependence (HCC) Active  Problems:   Alcohol withdrawal (HCC)   Suicidal ideations   Severe recurrent major depression (HCC)   GAD (generalized anxiety disorder)  Total Time spent with patient:  25 minutes  Past Psychiatric History: See H&P  Past Medical History:  Past Medical History:  Diagnosis Date   Bipolar disorder (HCC)    Cellulitis    Depression    Drug abuse (HCC)    Hepatitis C     Past Surgical History:  Procedure Laterality Date   BACK SURGERY     ROTATOR CUFF REPAIR     SHOULDER SURGERY     Family History:  Family History  Problem Relation Age of Onset   Alcohol abuse Father    Cancer Maternal Aunt    Family Psychiatric  History: See H&P  Social History:  Social History   Substance and Sexual Activity  Alcohol Use Yes   Comment: 6-7 bottles of wine per day     Social History   Substance and Sexual Activity  Drug Use Not Currently   Types: Marijuana, Benzodiazepines, Other-see comments, Methamphetamines, Cocaine   Comment: denies    Social History   Socioeconomic History   Marital status: Divorced    Spouse name: Not on file   Number of children: 2   Years of education: Not on file   Highest education level: GED or equivalent  Occupational History   Not on file  Tobacco Use   Smoking status: Every Day    Packs/day: 1.00    Years: 31.00    Pack years: 31.00    Types: Cigarettes    Start date: 10/22/1981  Smokeless tobacco: Never  Vaping Use   Vaping Use: Never used  Substance and Sexual Activity   Alcohol use: Yes    Comment: 6-7 bottles of wine per day   Drug use: Not Currently    Types: Marijuana, Benzodiazepines, Other-see comments, Methamphetamines, Cocaine    Comment: denies   Sexual activity: Not Currently  Other Topics Concern   Not on file  Social History Narrative   Not on file   Social Determinants of Health   Financial Resource Strain: Not on file  Food Insecurity: Not on file  Transportation Needs: Not on file  Physical Activity: Not on  file  Stress: Not on file  Social Connections: Not on file   Additional Social History:   Sleep: Good  Appetite:  Good  Current Medications: Current Facility-Administered Medications  Medication Dose Route Frequency Provider Last Rate Last Admin   acetaminophen (TYLENOL) tablet 650 mg  650 mg Oral Q6H PRN [name] [name]   650 mg at 10/07/21 1355   alum & mag hydroxide-simeth (MAALOX/MYLANTA) 200-200-20 MG/5ML suspension 30 mL  30 mL Oral Q4H PRN [name] Conn [name], [name]       chlordiazePOXIDE (LIBRIUM) capsule 25 mg  25 mg Oral Q6H PRN [name] Stammer [name], [name]   25 mg at 10/07/21 1354   cloNIDine (CATAPRES) tablet 0.1 mg  0.1 mg Oral BID [name] [name], [name] Donath, MD   0.1 mg at 10/07/21 0736   gabapentin (NEURONTIN) capsule 400 mg  400 mg Oral TID PC & HS [name] Bubb [name], [name]       hydrOXYzine (ATARAX) tablet 25 mg  25 mg Oral TID PRN [name] [name]   25 mg at 10/07/21 0740   loperamide (IMODIUM) capsule 2-4 mg  2-4 mg Oral PRN [name] [name]       LORazepam (ATIVAN) injection 2 mg  2 mg Intramuscular BID PRN [name] [name], [name] Donath, MD       magnesium hydroxide (MILK OF MAGNESIA) suspension 30 mL  30 mL Oral Daily PRN [name] Conn [name], [name]       multivitamin with minerals tablet 1 tablet  1 tablet Oral Daily [name] Conn [name], [name]   1 tablet at 10/07/21 0735   nicotine (NICODERM CQ - dosed in mg/24 hours) patch 21 mg  21 mg Transdermal Daily [name] Jim, Amy E, MD   21 mg at 10/07/21 0741   ondansetron (ZOFRAN-ODT) disintegrating tablet 4 mg  4 mg Oral Q6H PRN [name] Conn [name], [name]   4 mg at 10/07/21 1355   sertraline (ZOLOFT) tablet 50 mg  50 mg Oral Daily [name] Conn [name], [name]   50 mg at 10/07/21 0736   thiamine tablet 100 mg  100 mg Oral Daily [name] Conn [name], [name]   100 mg at 10/07/21 0736   traZODone (DESYREL) tablet 50 mg  50 mg Oral QHS PRN [name] [name]   50 mg at 10/06/21 2146    Lab Results:  No results found for this or any previous visit (from the past 48 hour(s)).  Blood Alcohol level:  Lab  Results  Component Value Date   ETH 199 (H) 10/05/2021   ETH 46 (H) 09/21/2021   Metabolic Disorder Labs: Lab Results  Component Value Date   HGBA1C 5.4 09/22/2021   MPG 108.28 09/22/2021   MPG 117 04/05/2021   No results found for: PROLACTIN Lab Results  Component Value Date   CHOL 143 09/22/2021   TRIG 54 09/22/2021   HDL  66 09/22/2021   CHOLHDL 2.2 09/22/2021   VLDL 11 09/22/2021   LDLCALC 66 09/22/2021   LDLCALC 64 04/05/2021   Physical Findings: AIMS:  , ,  ,  ,    CIWA:  CIWA-Ar Total: 14 COWS:     Musculoskeletal: Strength & Muscle Tone: within normal limits Gait & Station: normal Patient leans: N/[name]  Psychiatric Specialty Exam:  Presentation  General Appearance: Disheveled  Eye Contact:Minimal  Speech:Normal Rate  Speech Volume:Normal  Handedness:Right  Mood and Affect  Mood:Anxious; Depressed; Dysphoric; Hopeless; Worthless  Affect:Constricted  Field seismologist  Descriptions of Associations:Intact  Orientation:Full (Time, Place and Person)  Thought Content:Logical  History of Schizophrenia/Schizoaffective disorder:Yes  Duration of Psychotic Symptoms:Less than six months  Hallucinations:Hallucinations: Auditory; Tactile; Visual  Ideas of Reference:None  Suicidal Thoughts:Suicidal Thoughts: Yes, Passive SI Passive Intent and/or Plan: Without Intent; Without Plan  Homicidal Thoughts:Homicidal Thoughts: No  Sensorium  Memory:Immediate Good; Recent Good; Remote Good  Judgment:Poor  Insight:Poor  Executive Functions  Concentration:Poor  Attention Span:Poor  Recall:Good  Fund of Knowledge:Fair  Language:Fair  Psychomotor Activity  Psychomotor Activity:Psychomotor Activity: Restlessness  Assets  Assets:Communication Skills  Sleep  Sleep: Good. Physical Exam: Physical Exam Vitals and nursing note reviewed.  HENT:     Head: Normocephalic.     Nose: Nose normal.     Mouth/Throat:      Pharynx: Oropharynx is clear.  Eyes:     Pupils: Pupils are equal, round, and reactive to light.  Cardiovascular:     Rate and Rhythm: Normal rate.  Pulmonary:     Effort: Pulmonary effort is normal.  Genitourinary:    Comments: Deferred Musculoskeletal:        General: Normal range of motion.     Cervical back: Normal range of motion.  Skin:    General: Skin is warm and dry.  Neurological:     General: No focal deficit present.     Mental Status: He is alert and oriented to person, place, and time. Mental status is at baseline.   Review of Systems  Constitutional: Negative.   HENT: Negative.    Eyes: Negative.   Respiratory: Negative.    Cardiovascular: Negative.   Gastrointestinal: Negative.   Genitourinary: Negative.   Musculoskeletal: Negative.   Skin: Negative.   Neurological: Negative.   Endo/Heme/Allergies: Negative.   Psychiatric/Behavioral:  Positive for depression, hallucinations, substance abuse and suicidal ideas. Negative for memory loss. The patient is nervous/anxious. The patient does not have insomnia.   Blood pressure 128/86, pulse 80, temperature (!) 97.5 F (36.4 C), temperature source Oral, resp. rate 18, height 5\' 9"  (1.753 m), weight 69.4 kg, SpO2 99 %. Body mass index is 22.59 kg/m.  Treatment Plan Summary: Daily contact with patient to assess and evaluate symptoms and progress in treatment and Medication management.   Continue inpatient hospitalization.  Will continue today 10/07/2021 plan as below except where it is noted.   Treatment Plan/Recommendations:  1. Admit for crisis management and stabilization, estimated length of stay 3-5 days.    Diagnoses / Active Problems: Alcohol use disorder, severe Major depressive disorder, recurrent, severe, without psychotic features Generalized anxiety disorder PTSD   2. Medication management to reduce current symptoms to base line and improve the patient's overall level of functioning: See [facility] for plan  of care.   Continue Librium 25 mg po Q 6 hrs prn for CIWA > 10.  Increased gabapentin to 400 mg po qid for agitation/withdrawal symptoms.  Continue Lorazepam 2  mg IM twice daily prn for withdrawal seizures. Continue Sertraline 50 mg po daily for depression. Continue all other adjunct medications for the management of associated withdrawal symptoms.  Continue Trazodone 50 mg po Q hs prn for insomnia. Continue Nicotine patch 21 mg trans-dermally for nicotine withdrawal sx.  Continue other prn medications for other medical symptoms.  Continue other prn medications as recommended for all other medical complaints. Encourage group participation. Discharge disposition plan in progress (pt is interested to go to [name] substance abuse tx after dc).  [name] Stammer, [name], pmhnp, fnp-bc 10/07/2021, 4:04 PM Patient ID: [name], male   DOB: Mar 20, 1976, 45 y.o.   MRN: [id number]

## 2021-10-07 NOTE — Progress Notes (Signed)
°  D: Patient is alert and oriented x 4. Patient denies SI/HI/ AVH and pain. Disposition is labile and seeks attention with negative behavior.  Verbally contracts for safety to this Clinical research associate.   A:  Pt was given scheduled medications. Pt was encourage to attend groups. Q 15 minute checks were done for safety. Pt was offered support and encouragement by this Clinical research associate. Pt complied with scheduled medications. Pt reports concerns of improper medication management. Pt remains receptive to treatment and safety maintained on unit.    R: Will continue to monitor and assess. Safety maintained during this shift.       10/07/21 1400  Psych Admission Type (Psych Patients Only)  Admission Status Voluntary  Psychosocial Assessment  Patient Complaints Agitation  Eye Contact Brief  Facial Expression Anxious;Pinched  Affect Anxious;Apprehensive;Irritable  Speech Press photographer;Restless  Appearance/Hygiene Body odor;Disheveled;Poor hygiene;In scrubs  Behavior Characteristics Irritable  Mood Anxious;Labile;Irritable  Thought Process  Coherency Concrete thinking  Content Preoccupation  Delusions None reported or observed  Perception WDL  Hallucination None reported or observed  Judgment Poor  Confusion None  Danger to Self  Current suicidal ideation? Passive  Self-Injurious Behavior No self-injurious ideation or behavior indicators observed or expressed   Agreement Not to Harm Self Yes  Description of Agreement verbal  Danger to Others  Danger to Others None reported or observed

## 2021-10-07 NOTE — [facility] Group Notes (Signed)
Goals Group 10/07/21    Group Focus: affirmation, clarity of thought, and goals/reality orientation Treatment Modality:  Psychoeducation Interventions utilized were assignment, group exercise, and support Purpose: To be able to understand and verbalize the reason for their admission to the hospital. To understand that the medication helps with their chemical imbalance but they also need to work on their choices in life. To be challenged to develop a list of 30 positives about themselves. Also introduce the concept that "feelings" are not reality.  Participation Level:  Did not attend   Dione Housekeeper

## 2021-10-08 DIAGNOSIS — F1020 Alcohol dependence, uncomplicated: Secondary | ICD-10-CM

## 2021-10-08 MED ORDER — SERTRALINE HCL 100 MG PO TABS
100.0000 mg | ORAL_TABLET | Freq: Every day | ORAL | Status: DC
  Administered 2021-10-09 – 2021-10-10 (×2): 100 mg via ORAL
  Filled 2021-10-08 (×3): qty 1
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
## 2021-10-08 NOTE — Progress Notes (Signed)
The focus of this group is to help patients review their daily goal of treatment and discuss progress on daily workbooks. Pt attended the evening group session and responded to all discussion prompts from the Writer. Pt shared that today was an "okay" day on the unit, the highlight of which was "feeling better coming off my detox."  Pt shared that today was his birthday and that he was hopeful to leave directly from here to another treatment center this coming week. "If I'm discharged before I have something else, I'm afraid I'll drink."  Pt was wished a happy birthday from the Writer and his peers in the dayroom. Pt was also encouraged to formulate backup plans to continue his treatment/sobriety in the event he's unable to leave directly from here to another facility.  Pt rated his day a 5 out of 10 and he appeared depressed.

## 2021-10-08 NOTE — Plan of Care (Signed)
Nurse discussed coping skills with patient.  

## 2021-10-08 NOTE — Progress Notes (Signed)
Patient has been up and active on the unit, attended group this evening and has voiced no complaints. Patient currently denies having pain, -si/hi/a/v hall. Support and encouragement offered, safety maintained on unit, will continue to monitor.   10/08/21 2357  Psych Admission Type (Psych Patients Only)  Admission Status Voluntary  Psychosocial Assessment  Patient Complaints Depression  Eye Contact Brief  Facial Expression Sad;Worried  Affect Apprehensive;Depressed  Speech Logical/coherent  Interaction Assertive  Motor Activity Slow  Appearance/Hygiene Disheveled;Poor hygiene  Behavior Characteristics Appropriate to situation;Cooperative  Mood Depressed;Pleasant  Thought Process  Coherency Concrete thinking  Content Preoccupation  Delusions None reported or observed  Perception WDL  Hallucination Auditory;Visual  Judgment Poor  Confusion Mild  Danger to Self  Current suicidal ideation? Denies  Self-Injurious Behavior No self-injurious ideation or behavior indicators observed or expressed   Agreement Not to Harm Self Yes  Description of Agreement contracts for safey verbal  Danger to Others  Danger to Others None reported or observed

## 2021-10-08 NOTE — [facility] Group Notes (Signed)
Adult Psychoeducational Group Not °Date:  10/08/2021 °Time:  0900-1045 °Group Topic/Focus: PROGRESSIVE RELAXATION. A group where deep breathing is taught and tensing and relaxation muscle groups is used. Imagery is used as well.  Pts are asked to imagine 3 pillars that hold them up when they are not able to hold themselves up. ° °Participation Level:  Did not attend °Panayiotis Rainville A ° ° °

## 2021-10-08 NOTE — Group Note (Signed)
Date:  10/08/2021 Time:  9:52 AM  Group Topic/Focus:  Orientation:   The focus of this group is to educate the patient on the purpose and policies of crisis stabilization and provide a format to answer questions about their admission.  The group details unit policies and expectations of patients while admitted.    Participation Level:  Did not attend  Participation Quality:    Affect:    Cognitive:    Insight:   Engagement in Group:    Modes of Intervention:    Additional Comments:  Pt did not attend.  Jaquita Rector 10/08/2021, 9:52 AM

## 2021-10-08 NOTE — Group Note (Signed)
[facility] LCSW Group Therapy Note  10/08/2021  10:30am-11:30am  Type of Therapy and Topic:  Group Therapy:  Adding Supports Including Yourself  Participation Level:  Minimal   Description of Group:   Patients in this group were introduced to the concept that additional supports including self-support are an essential part of recovery.  Patients listed their current healthy and unhealthy supports, and discussed the difference between the two.   Several songs were played and a group discussion ensued in which patients stated they could relate to the songs which inspired them to realize they have be willing to help themselves in order to succeed, because other people cannot achieve sobriety or stability for them.  Parents were encouraged toward self-advocacy and self-support as part of their recovery.  They discussed their reactions to these songs' messages, which were positive and hopeful.  Before group ended, they identified the supports they believe they need to add to their lives to achieve their goals at discharge.   Therapeutic Goals: 1)  explain the difference between healthy and unhealthy supports and discuss what specific supports are currently in patients' lives 2)  demonstrate the importance of being a key part of one's own support system 3)  discuss the need for appropriate boundaries with supports 4)  elicit ideas from patients about supports that need to be added in order to achieve goals   Summary of Patient Progress:   The patient listed current healthy supports as his sister but she does not live locally and current unhealthy supports as himself, citing his constant drinking as the way in which he is most unhealthy for himself.  The patient listened but did not share further.  Therapeutic Modalities:   Motivational Interviewing Activity  Lynnell Chad

## 2021-10-08 NOTE — Progress Notes (Signed)
[facility] MD Progress Note  10/08/2021 2:00 PM [name]  MRN:  [id number]  Subjective: Tesean reports, "I feel more depressed today. I'm anxious & feeling restless. I need my medicines changed or increased. I don't feel like I'm getting any better. I'm still thinking about killing myself. I'm seeing rats crawling on the floor in my room, I hear music playing from inside the walls in my room. I feel like no one is listening to me. I'm hoping to get into the substance abuse treatment program in [place], [place] after discharge. I need it or I'm going to die from drinking too much". Daily notes: Trygve is seen, chart reviewed. The chart findings discussed with the treatment team. He presents alert, oriented x 4. He is visible on the unit attending group sessions. He says he is still feeling very depressed, shaky & psychotic (seeing rats crawling on the floor & admits hearing music playing from inside the walls in his room. He says he may be going to a substance abuse treatment program in [place], [place] after discharge to continue substance abuse treatment.  He is encouraged to continue to take his medications as recommended to help stabilize his symptoms. His gabapentin was increased to 400 mg qid to help with alcohol withdrawal syndrome. His Zoloft is increased to 100 mg po daily. He remains on all his treatment regimen as recommended including the Zyprexa zydis for agitation/worsening psychosis. He denies any side effects from his medications. He denies any HI, delusional thoughts or paranoia. He is eating & sleeping well.  Reason for admission: 45 year old Caucasian male known in this [facility] from his previous hospitalizations & treatments. Chart review indicated that all his hospitalization in this [facility] were related to substance intoxication. Anita is known to be non-adherence to his treatment regimen. He was recently admitted/discharged from this [facility] from 09-21-21 thru 09-25-21 with medications & outpatient referral  services. However, patient returned back to the [facility] with similar complaints. His BAL on this present admission was 199 & UDS was positive for benzodiazepine & THC. He was also citing suicidal ideations with plans/attempt to shoot himself to death.  Principal Problem: Alcohol use disorder, severe, dependence (HCC)  Diagnosis: Principal Problem:   Alcohol use disorder, severe, dependence (HCC) Active Problems:   Alcohol withdrawal (HCC)   Suicidal ideations   Severe recurrent major depression (HCC)   GAD (generalized anxiety disorder)  Total Time spent with patient: 15 minutes  Past Psychiatric History: See H&P  Past Medical History:  Past Medical History:  Diagnosis Date   Bipolar disorder (HCC)    Cellulitis    Depression    Drug abuse (HCC)    Hepatitis C     Past Surgical History:  Procedure Laterality Date   BACK SURGERY     ROTATOR CUFF REPAIR     SHOULDER SURGERY     Family History:  Family History  Problem Relation Age of Onset   Alcohol abuse Father    Cancer Maternal Aunt    Family Psychiatric  History: See H&P  Social History:  Social History   Substance and Sexual Activity  Alcohol Use Yes   Comment: 6-7 bottles of wine per day     Social History   Substance and Sexual Activity  Drug Use Not Currently   Types: Marijuana, Benzodiazepines, Other-see comments, Methamphetamines, Cocaine   Comment: denies    Social History   Socioeconomic History   Marital status: Divorced    Spouse name: Not on  file   Number of children: 2   Years of education: Not on file   Highest education level: GED or equivalent  Occupational History   Not on file  Tobacco Use   Smoking status: Every Day    Packs/day: 1.00    Years: 31.00    Pack years: 31.00    Types: Cigarettes    Start date: 10/22/1981   Smokeless tobacco: Never  Vaping Use   Vaping Use: Never used  Substance and Sexual Activity   Alcohol use: Yes    Comment: 6-7 bottles of wine per day   Drug  use: Not Currently    Types: Marijuana, Benzodiazepines, Other-see comments, Methamphetamines, Cocaine    Comment: denies   Sexual activity: Not Currently  Other Topics Concern   Not on file  Social History Narrative   Not on file   Social Determinants of Health   Financial Resource Strain: Not on file  Food Insecurity: Not on file  Transportation Needs: Not on file  Physical Activity: Not on file  Stress: Not on file  Social Connections: Not on file   Additional Social History:   Sleep: Good  Appetite:  Good  Current Medications: Current Facility-Administered Medications  Medication Dose Route Frequency Provider Last Rate Last Admin   acetaminophen (TYLENOL) tablet 650 mg  650 mg Oral Q6H PRN Rozetta Nunnery, NP   650 mg at 10/07/21 1355   alum & mag hydroxide-simeth (MAALOX/MYLANTA) 200-200-20 MG/5ML suspension 30 mL  30 mL Oral Q4H PRN Lindon Romp A, NP       chlordiazePOXIDE (LIBRIUM) capsule 25 mg  25 mg Oral Q6H PRN Lindell Spar I, NP   25 mg at 10/08/21 [id number]   cloNIDine (CATAPRES) tablet 0.1 mg  0.1 mg Oral BID Massengill, Ovid Curd, MD   0.1 mg at 10/08/21 0820   gabapentin (NEURONTIN) capsule 400 mg  400 mg Oral TID PC & HS Lindell Spar I, NP   400 mg at 10/08/21 1216   hydrOXYzine (ATARAX) tablet 25 mg  25 mg Oral TID PRN Rozetta Nunnery, NP   25 mg at 10/07/21 1636   LORazepam (ATIVAN) injection 2 mg  2 mg Intramuscular BID PRN Massengill, Ovid Curd, MD       magnesium hydroxide (MILK OF MAGNESIA) suspension 30 mL  30 mL Oral Daily PRN Lindon Romp A, NP       multivitamin with minerals tablet 1 tablet  1 tablet Oral Daily Lindon Romp A, NP   1 tablet at 10/08/21 0817   nicotine (NICODERM CQ - dosed in mg/24 hours) patch 21 mg  21 mg Transdermal Daily Nelda Marseille, Amy E, MD   21 mg at 10/08/21 0818   OLANZapine zydis (ZYPREXA) disintegrating tablet 5 mg  5 mg Oral Q6H PRN Lindell Spar I, NP   5 mg at 10/08/21 1218   sertraline (ZOLOFT) tablet 50 mg  50 mg Oral Daily Lindon Romp A, NP   50 mg at 10/08/21 0820   thiamine tablet 100 mg  100 mg Oral Daily Lindon Romp A, NP   100 mg at 10/08/21 0820   traZODone (DESYREL) tablet 50 mg  50 mg Oral QHS PRN Rozetta Nunnery, NP   50 mg at 10/06/21 2146   Lab Results:  No results found for this or any previous visit (from the past 56 hour(s)).  Blood Alcohol level:  Lab Results  Component Value Date   ETH 199 (H) 10/05/2021   ETH 46 (H) 09/21/2021  Metabolic Disorder Labs: Lab Results  Component Value Date   HGBA1C 5.4 09/22/2021   MPG 108.28 09/22/2021   MPG 117 04/05/2021   No results found for: PROLACTIN Lab Results  Component Value Date   CHOL 143 09/22/2021   TRIG 54 09/22/2021   HDL 66 09/22/2021   CHOLHDL 2.2 09/22/2021   VLDL 11 09/22/2021   LDLCALC 66 09/22/2021   LDLCALC 64 04/05/2021   Physical Findings: AIMS:  , ,  ,  ,    CIWA:  CIWA-Ar Total: 8 COWS:     Musculoskeletal: Strength & Muscle Tone: within normal limits Gait & Station: normal Patient leans: N/A  Psychiatric Specialty Exam:  Presentation  General Appearance: Disheveled  Eye Contact:Minimal  Speech:Normal Rate  Speech Volume:Normal  Handedness:Right  Mood and Affect  Mood:Anxious; Depressed; Dysphoric; Hopeless; Worthless  Affect:Constricted  Psychologist, clinical  Descriptions of Associations:Intact  Orientation:Full (Time, Place and Person)  Thought Content:Logical  History of Schizophrenia/Schizoaffective disorder:Yes  Duration of Psychotic Symptoms:Less than six months  Hallucinations:No data recorded  Ideas of Reference:None  Suicidal Thoughts:No data recorded  Homicidal Thoughts:No data recorded  Sensorium  Memory:Immediate Good; Recent Good; Remote Good  Judgment:Poor  Insight:Poor  Executive Functions  Concentration:Poor  Attention Span:Poor  [place]  Psychomotor Activity  Psychomotor Activity:No data  recorded  Assets  Assets:Communication Skills  Sleep  Sleep: Good. Physical Exam: Physical Exam Vitals and nursing note reviewed.  HENT:     Head: Normocephalic.     Nose: Nose normal.     Mouth/Throat:     Pharynx: Oropharynx is clear.  Eyes:     Pupils: Pupils are equal, round, and reactive to light.  Cardiovascular:     Rate and Rhythm: Normal rate.  Pulmonary:     Effort: Pulmonary effort is normal.  Genitourinary:    Comments: Deferred Musculoskeletal:        General: Normal range of motion.     Cervical back: Normal range of motion.  Skin:    General: Skin is warm and dry.  Neurological:     General: No focal deficit present.     Mental Status: He is alert and oriented to person, place, and time. Mental status is at baseline.   Review of Systems  Constitutional: Negative.   HENT: Negative.    Eyes: Negative.   Respiratory: Negative.    Cardiovascular: Negative.   Gastrointestinal: Negative.   Genitourinary: Negative.   Musculoskeletal: Negative.   Skin: Negative.   Neurological: Negative.   Endo/Heme/Allergies: Negative.   Psychiatric/Behavioral:  Positive for depression, hallucinations, substance abuse and suicidal ideas. Negative for memory loss. The patient is nervous/anxious. The patient does not have insomnia.   Blood pressure 130/83, pulse 85, temperature 98 F (36.7 C), resp. rate 17, height 5\' 9"  (1.753 m), weight 69.4 kg, SpO2 100 %. Body mass index is 22.59 kg/m.  Treatment Plan Summary: Daily contact with patient to assess and evaluate symptoms and progress in treatment and Medication management.   Continue inpatient hospitalization.  Will continue today 10/08/2021 plan as below except where it is noted.   Treatment Plan/Recommendations:  1. Admit for crisis management and stabilization, estimated length of stay 3-5 days.    Diagnoses / Active Problems: Alcohol use disorder, severe Major depressive disorder, recurrent, severe, without  psychotic features Generalized anxiety disorder PTSD   2. Medication management to reduce current symptoms to base line and improve the patient's overall level of functioning: See  MAR for plan of care.   Continue Librium 25 mg po Q 6 hrs prn for CIWA > 10.  Continue gabapentin 400 mg po qid for agitation/withdrawal symptoms.  Continue Lorazepam 2 mg IM twice daily prn for withdrawal seizures. Increased Sertraline to 100 mg po daily for depression. Continue all other adjunct medications for the management of associated withdrawal symptoms.  Continue Trazodone 50 mg po Q hs prn for insomnia. Continue the agitation/psychosis protocols. Continue Nicotine patch 21 mg trans-dermally for nicotine withdrawal sx.  Continue other prn medications for other medical symptoms. Continue other prn medications as recommended for all other medical complaints. Encourage group participation. Discharge disposition plan in progress (pt is interested to go to a substance abuse tx after dc).  Lindell Spar, NP, pmhnp, fnp-bc 10/08/2021, 2:00 PM Patient ID: [name] [name], male   DOB: [birth year], 45 y.o.   MRN: [id number] Patient ID: WHITMAN LAPLUME, male   DOB: 1976-04-19, 45 y.o.   MRN: [id number]

## 2021-10-08 NOTE — Progress Notes (Signed)
D:  Patient denied SI and HI, contracts for safety.  Patient has tremors, crawling bugs on arms, walls, anxiety, depression. A:  Medications administered per MD orders.  Emotional support and encouragement given patient. R:  Safety maintained with 15 minute checks.

## 2021-10-09 MED ORDER — TRAZODONE HCL 100 MG PO TABS
100.0000 mg | ORAL_TABLET | Freq: Every evening | ORAL | Status: DC | PRN
  Administered 2021-10-09: 21:00:00 100 mg via ORAL
  Filled 2021-10-09: qty 1

## 2021-10-09 MED ORDER — IBUPROFEN 600 MG PO TABS
600.0000 mg | ORAL_TABLET | Freq: Four times a day (QID) | ORAL | Status: DC | PRN
  Administered 2021-10-09: 16:00:00 600 mg via ORAL
  Filled 2021-10-09: qty 1

## 2021-10-09 NOTE — BHH Group Notes (Signed)
Adult Psychoeducational Group Note  Date:  10/09/2021 Time:  10:33 AM  Group Topic/Focus:  Goals Group:   The focus of this group is to help patients establish daily goals to achieve during treatment and discuss how the patient can incorporate goal setting into their daily lives to aide in recovery.  Participation Level:  Active  Participation Quality:  Appropriate  Affect:  Appropriate  Cognitive:  Appropriate  Insight: Appropriate  Engagement in Group:  Engaged  Modes of Intervention:  Discussion  Additional Comments:   Patient attended morning orientation group and participated.  [name] [name] W Vinaya Sancho 10/09/2021, 10:33 AM

## 2021-10-09 NOTE — Progress Notes (Signed)
Psychoeducational Group Note  Date:  10/09/2021 Time:  2132  Group Topic/Focus:  Relapse Prevention Planning:   The focus of this group is to define relapse and discuss the need for planning to combat relapse.  Participation Level: Did Not Attend  Participation Quality:  Not Applicable  Affect:  Not Applicable  Cognitive:  Not Applicable  Insight:  Not Applicable  Engagement in Group: Not Applicable  Additional Comments:  The patient did not attend group.  Hazle Coca S 10/09/2021, 9:32 PM

## 2021-10-09 NOTE — Progress Notes (Signed)
D:  Patient's self inventory sheet, patient sleeps good, sleep medication helpful.  Good appetite, normal energy level, good concentration.  Rated depression and anxiety 3, hopeless 1.  Denied withdrawals.  Denied SI.  Denied physical problems.  Denied physical pain.  Goal talk to MD  Does have discharge plans. A:  Medications administered per MD orders.  Emotional support and encouragement given patient. R:  Denied SI and HI, contracts for safety.  Denied A/V hallucinations.  Safety maintained with 15 minute checks.

## 2021-10-09 NOTE — Group Note (Signed)
Recreation Therapy Group Note   Group Topic:Stress Management  Group Date: 10/09/2021 Start Time: 0930 End Time: 0950 Facilitators: Caroll Rancher, [place] Location: [address] Dayroom   Goal Area(s) Addresses:  Patient will identify positive stress management techniques. Patient will identify benefits of using stress management post d/c.  Group Description: LRT played a meditation to help patients relax and lessen anxiety.  Patients were to listen and follow along as meditation played to gain its full benefits.  Clinical Observations/Individualized Feedback: Stress management group did not occur due to goals group running over to 0950.   Plan: Continue to engage patient in RT group sessions 2-3x/week.   Caroll Rancher, LRT,CTRS 10/09/2021 1:16 PM

## 2021-10-09 NOTE — BHH Counselor (Signed)
CSW spoke with the Pt who states that he is not interested in going to any other facility and only wants to go to [facility] in [place].  The Pt states that he would like to go to the [facility] first and work on getting his ID Card and then go to the [employer] [employer] in [place] so he can work and be away from [employer] area.  He states that the [place] area is a trigger for him.  CSW will provide the Pt with transportation to the [facility] at discharge and with bus passes to help him get to [place] after he gets an ID.  CSW has already provided the Pt with the [facility] application and he states that he has already been working on completing this

## 2021-10-09 NOTE — Progress Notes (Signed)
[facility] MD Progress Note  10/09/2021 3:03 PM [name] [name]  MRN:  [id number]  HPI: [name] [name] is a 45 year old male with a past psychiatric history of major depressive disorder, anxiety disorder, PTSD, and alcohol use disorder, who was admitted to the psychiatric unit for evaluation and treatment of worsening depression and suicidal thoughts with plan.  Yesterday's recommendation by the Psychiatry team Continue Librium 25 mg po Q 6 hrs prn for CIWA > 10.  Continue gabapentin 400 mg po qid for agitation/withdrawal symptoms.  Continue Lorazepam 2 mg IM twice daily prn for withdrawal seizures. Increased Sertraline to 100 mg po daily for depression. Continue all other adjunct medications for the management of associated withdrawal symptoms.  Continue Trazodone 50 mg po Q hs prn for insomnia. Continue the agitation/psychosis protocols. Continue Nicotine patch 21 mg trans-dermally for nicotine withdrawal sx.  Continue other prn medications for other medical symptoms.  Today's Evaluation Chart reviewed and case discussed with treatment team. During this encounter, pt is observed to have a flat affect and depressed mood, he is calm and cooperative, attention to personal hygiene and grooming is fair, and he is able to maintain eye contact through entire assessment. Pt reports that he is not currently suicidal, but will become suicidal and do something to kill himself if discharged. Pt able to verbally contract for safety while in this hospital. Pt denies HI, reports +AH of "music coming through the walls" in his room, and of +AH of  "rats crawling on the floor yesterday". CIWA score was 6 earlier in the morning, and pt received Librium 25 mg then.  Pt reports having a high level of anxiety, even though he remains calm during this encounter. Pt reports his appetite as fair and improving, reports a normal energy level, reports poor sleep quality last night. Pt reports back pain of 8 (10 being the worst), but  adds that this is not new pain, and that he has a history of chronic back pain. Pt observed to have moderate amounts of tremor to his b/l arms. Pt reports that he is tolerating his medications well, and requests for more Trazodone to help him with insomnia, and states Tylenol is not sufficient in alleviating the pain to his back. Will order Ibuprofen 600 mg Q 6 H PRN for back pain, and will increase Trazodone to 100 mg PRN nightly for insomnia. Will order baseline lipid panel & baseline UA. V/S are WNL.  Principal Problem: Alcohol use disorder, severe, dependence (HCC)  Diagnosis: Principal Problem:   Alcohol use disorder, severe, dependence ([place]) Active Problems:   Alcohol withdrawal ([place])   Suicidal ideations   Severe recurrent major depression (HCC)   GAD (generalized anxiety disorder)  Total Time spent with patient: 15 minutes  Past Psychiatric History: See H&P  Past Medical History:  Past Medical History:  Diagnosis Date   Bipolar disorder ([place])    Cellulitis    Depression    Drug abuse ([place])    Hepatitis C     Past Surgical History:  Procedure Laterality Date   BACK SURGERY     ROTATOR CUFF REPAIR     SHOULDER SURGERY     Family History:  Family History  Problem Relation Age of Onset   Alcohol abuse Father    Cancer Maternal Aunt    Family Psychiatric  History: See H&P  Social History:  Social History   Substance and Sexual Activity  Alcohol Use Yes   Comment: 6-7 bottles of wine per  day     Social History   Substance and Sexual Activity  Drug Use Not Currently   Types: Marijuana, Benzodiazepines, Other-see comments, Methamphetamines, Cocaine   Comment: denies    Social History   Socioeconomic History   Marital status: Divorced    Spouse name: Not on file   Number of children: 2   Years of education: Not on file   Highest education level: GED or equivalent  Occupational History   Not on file  Tobacco Use   Smoking status: Every Day    Packs/day:  1.00    Years: 31.00    Pack years: 31.00    Types: Cigarettes    Start date: 10/22/1981   Smokeless tobacco: Never  Vaping Use   Vaping Use: Never used  Substance and Sexual Activity   Alcohol use: Yes    Comment: 6-7 bottles of wine per day   Drug use: Not Currently    Types: Marijuana, Benzodiazepines, Other-see comments, Methamphetamines, Cocaine    Comment: denies   Sexual activity: Not Currently  Other Topics Concern   Not on file  Social History Narrative   Not on file   Social Determinants of Health   Financial Resource Strain: Not on file  Food Insecurity: Not on file  Transportation Needs: Not on file  Physical Activity: Not on file  Stress: Not on file  Social Connections: Not on file   Additional Social History:   Sleep: Good  Appetite:  Good  Current Medications: Current Facility-Administered Medications  Medication Dose Route Frequency Provider Last Rate Last Admin   acetaminophen (TYLENOL) tablet 650 mg  650 mg Oral Q6H PRN Rozetta Nunnery, NP   650 mg at 10/07/21 1355   alum & mag hydroxide-simeth (MAALOX/MYLANTA) 200-200-20 MG/5ML suspension 30 mL  30 mL Oral Q4H PRN Lindon Romp A, NP       chlordiazePOXIDE (LIBRIUM) capsule 25 mg  25 mg Oral Q6H PRN Lindell Spar I, NP   25 mg at 10/09/21 0754   cloNIDine (CATAPRES) tablet 0.1 mg  0.1 mg Oral BID Massengill, Ovid Curd, MD   0.1 mg at 10/09/21 0745   gabapentin (NEURONTIN) capsule 400 mg  400 mg Oral TID PC & HS Lindell Spar I, NP   400 mg at 10/09/21 1300   hydrOXYzine (ATARAX) tablet 25 mg  25 mg Oral TID PRN Rozetta Nunnery, NP   25 mg at 10/07/21 1636   ibuprofen (ADVIL) tablet 600 mg  600 mg Oral Q6H PRN Nicholes Rough, NP       LORazepam (ATIVAN) injection 2 mg  2 mg Intramuscular BID PRN Massengill, Ovid Curd, MD       magnesium hydroxide (MILK OF MAGNESIA) suspension 30 mL  30 mL Oral Daily PRN Lindon Romp A, NP       multivitamin with minerals tablet 1 tablet  1 tablet Oral Daily Lindon Romp A, NP   1  tablet at 10/09/21 0746   nicotine (NICODERM CQ - dosed in mg/24 hours) patch 21 mg  21 mg Transdermal Daily Nelda Marseille, Amy E, MD   21 mg at 10/09/21 0749   OLANZapine zydis (ZYPREXA) disintegrating tablet 5 mg  5 mg Oral Q6H PRN Lindell Spar I, NP   5 mg at 10/08/21 1218   sertraline (ZOLOFT) tablet 100 mg  100 mg Oral Daily Lindell Spar I, NP   100 mg at 10/09/21 0747   thiamine tablet 100 mg  100 mg Oral Daily Rozetta Nunnery, NP  100 mg at 10/09/21 0748   traZODone (DESYREL) tablet 100 mg  100 mg Oral QHS PRN Nicholes Rough, NP       Lab Results:  No results found for this or any previous visit (from the past 48 hour(s)).  Blood Alcohol level:  Lab Results  Component Value Date   ETH 199 (H) 10/05/2021   ETH 46 (H) [id number]   Metabolic Disorder Labs: Lab Results  Component Value Date   HGBA1C 5.4 09/22/2021   MPG 108.28 09/22/2021   MPG 117 04/05/2021   No results found for: PROLACTIN Lab Results  Component Value Date   CHOL 143 09/22/2021   TRIG 54 09/22/2021   HDL 66 09/22/2021   CHOLHDL 2.2 09/22/2021   VLDL 11 09/22/2021   LDLCALC 66 09/22/2021   LDLCALC 64 04/05/2021   Physical Findings: AIMS: 0 CIWA:  CIWA-Ar Total: 6 COWS:  n/A   Musculoskeletal: Strength & Muscle Tone: within normal limits Gait & Station: normal Patient leans: N/A  Psychiatric Specialty Exam:  Presentation  General Appearance: Appropriate for Environment  Eye Contact:Fair  Speech:Clear and Coherent  Speech Volume:Normal  Handedness:Right  Mood and Affect  Mood:Depressed  Affect:Flat  Thought Process  Thought Processes:Coherent  Descriptions of Associations:Intact  Orientation:Full (Time, Place and Person)  Thought Content:Logical  History of Schizophrenia/Schizoaffective disorder:No  Duration of Psychotic Symptoms:Less than six months  Hallucinations:Hallucinations: Auditory; Visual Description of Auditory Hallucinations: +AH of music Description of Visual  Hallucinations: +VH of rats crawling on the floor   Ideas of Reference:None  Suicidal Thoughts:Suicidal Thoughts: Yes, Passive  Homicidal Thoughts:Homicidal Thoughts: No  Sensorium  Memory:Immediate Good  Judgment:Poor  Insight:Poor  Executive Functions  Concentration:Fair  Attention Span:Fair  [place]  Psychomotor Activity  Psychomotor Activity:Psychomotor Activity: Normal  Assets  Assets:Communication Skills  Sleep  Sleep: Good. Physical Exam: Physical Exam Vitals and nursing note reviewed.  Constitutional:      General: He is not in acute distress. HENT:     Head: Normocephalic.     Nose: Nose normal.     Mouth/Throat:     Pharynx: Oropharynx is clear.  Eyes:     Pupils: Pupils are equal, round, and reactive to light.  Cardiovascular:     Rate and Rhythm: Normal rate.  Pulmonary:     Effort: Pulmonary effort is normal.  Genitourinary:    Comments: Deferred Musculoskeletal:        General: Normal range of motion.     Cervical back: Normal range of motion. No rigidity.  Skin:    General: Skin is warm and dry.  Neurological:     General: No focal deficit present.     Mental Status: He is alert and oriented to person, place, and time. Mental status is at baseline.   Review of Systems  Constitutional: Negative.  Negative for chills, diaphoresis and fever.  HENT: Negative.  Negative for hearing loss and sore throat.   Eyes: Negative.  Negative for double vision and discharge.  Respiratory: Negative.  Negative for cough, hemoptysis and shortness of breath.   Cardiovascular: Negative.  Negative for leg swelling.  Gastrointestinal: Negative.  Negative for diarrhea, heartburn, nausea and vomiting.  Genitourinary: Negative.   Musculoskeletal: Negative.   Skin: Negative.   Neurological: Negative.  Negative for tingling, tremors and headaches.  Endo/Heme/Allergies: Negative.   Psychiatric/Behavioral:  Positive  for depression, hallucinations, substance abuse and suicidal ideas. Negative for memory loss. The patient is nervous/anxious. The patient does not  have insomnia.   Blood pressure 132/83, pulse 91, temperature (!) 97.3 F (36.3 C), temperature source Oral, resp. rate 18, height 5\' 9"  (1.753 m), weight 69.4 kg, SpO2 97 %. Body mass index is 22.59 kg/m.  Treatment Plan Summary: Daily contact with patient to assess and evaluate symptoms and progress in treatment and Medication management.   Plan: Safety and Monitoring: Voluntary admission to inpatient psychiatric unit for safety, stabilization and treatment Daily contact with patient to assess and evaluate symptoms and progress in treatment Patient's case to be discussed in multi-disciplinary team meeting Observation Level : q15 minute checks Vital signs: q12 hours Precautions: suicide, but pt currently verbally contracts for safety on unit   Alcohol use Disorder -Continue Librium every 6 hrs as needed for CIWA >10 -Continue Gabapentin 400 mg QID  Major depressive disorder, recurrent, severe, without psychotic features -Continue Zoloft 100 mg Daily  Anxiety -Continue Hydroxyzine 25 mg 3 times daily as needed/anxiety   Insomnia -Increase Trazodone to 100 mg nightly PRN  Other PRN Medications -Acetaminophen 650 mg every 6 as needed/mild pain -Maalox 30 mL oral every 4 as needed/digestion -Magnesium hydroxide 30 mL daily as needed/mild constipation  -Continue Lorazepam 2 mg IM twice daily prn for withdrawal seizures. -Start Ibuprofen 600 mg every 6 hrs as needed for moderate back pain -Continue Zyprexa 5 mg every 6 hrs as needed for agitation  Discharge Planning: Social work and case management to assist with discharge planning and identification of hospital follow-up needs prior to discharge Estimated LOS: 5-7 days Discharge Concerns: Need to establish a safety plan; Medication compliance and effectiveness Discharge Goals: Return  home with outpatient referrals for mental health follow-up including medication management/psychotherapy.  , NP 10/09/2021, 3:03 PM

## 2021-10-09 NOTE — Progress Notes (Signed)
D:  [name] [name] was isolative to his room this evening.  He did not attend evening AA group.  He was hyper focused on getting his Librium this evening explained the CIWA scale and that he wasn't scoring enough to give him Librium.  He did take his hs medications without difficulty and requested trazodone for sleep.  He denied SI/HI or AVH.  VSS.  A:  1:1 for support and encouragement.  Encouraged groups and unit activities. Medications given as ordered.  Q 15 minute checks maintained for safety. R:  He remains safe on the unit.   10/09/21 2103  Psych Admission Type (Psych Patients Only)  Admission Status Voluntary  Psychosocial Assessment  Patient Complaints Anxiety;Insomnia  Eye Contact Brief  Facial Expression Sad  Affect Depressed  Speech Logical/coherent  Interaction Assertive  Motor Activity Slow  Appearance/Hygiene Disheveled;Poor hygiene  Behavior Characteristics Cooperative  Mood Depressed  Thought Process  Coherency Concrete thinking  Content Preoccupation  Delusions None reported or observed  Perception WDL  Hallucination None reported or observed  Judgment Poor  Confusion Mild  Danger to Self  Current suicidal ideation? Denies  Danger to Others  Danger to Others None reported or observed

## 2021-10-09 NOTE — Group Note (Addendum)
LCSW Group Therapy Note   Group Date: 10/09/2021 Start Time: 1300 End Time: 1400  Therapy Type: Group Therapy  Participation Level:  Minimal   Patients received a worksheet with an outline of 2 gingerbread men with a separation in the middle of the page. One sign designated what the pt sees about themselves and the other is what others see. Pts were asked to introduce themselves and share something they like about themself. Pts were then asked to draw, write or color how they view themselves as well as how they are viewed by others. CSW led discussion about the feelings and words associated with each side.   Patient Summary:  Pt came late to group so he was unable to participate in introductions. Pt completed worksheet and participated in discussion.  [name], LCSWA 10/09/2021  1:27 PM

## 2021-10-09 NOTE — Plan of Care (Signed)
Nurse discussed coping skills with patient.  

## 2021-10-10 LAB — URINALYSIS, ROUTINE W REFLEX MICROSCOPIC
Bilirubin Urine: NEGATIVE
Glucose, UA: NEGATIVE mg/dL
Hgb urine dipstick: NEGATIVE
Ketones, ur: NEGATIVE mg/dL
Leukocytes,Ua: NEGATIVE
Nitrite: NEGATIVE
Protein, ur: NEGATIVE mg/dL
Specific Gravity, Urine: 1.019 (ref 1.005–1.030)
pH: 7 (ref 5.0–8.0)

## 2021-10-10 LAB — LIPID PANEL
Cholesterol: 170 mg/dL (ref 0–200)
HDL: 64 mg/dL (ref >40)
LDL Cholesterol: 89 mg/dL (ref 0–99)
Total CHOL/HDL Ratio: 2.7 RATIO
Triglycerides: 86 mg/dL (ref <150)
VLDL: 17 mg/dL (ref 0–40)

## 2021-10-10 MED ORDER — CHLORDIAZEPOXIDE HCL 25 MG PO CAPS: 25.0000 mg | ORAL_CAPSULE | Freq: Two times a day (BID) | ORAL | Status: DC | PRN

## 2021-10-10 MED ORDER — NICOTINE 21 MG/24HR TD PT24: 21.0000 mg | MEDICATED_PATCH | Freq: Every day | TRANSDERMAL | 0 refills | Status: DC

## 2021-10-10 MED ORDER — SERTRALINE HCL 100 MG PO TABS: 100.0000 mg | ORAL_TABLET | Freq: Every day | ORAL | 0 refills | Status: DC

## 2021-10-10 MED ORDER — TRAZODONE HCL 100 MG PO TABS
100.0000 mg | ORAL_TABLET | Freq: Every evening | ORAL | 0 refills | Status: DC | PRN
Start: 2021-10-10 — End: 2024-07-28

## 2021-10-10 MED ORDER — CLONIDINE HCL 0.1 MG PO TABS: 0.1000 mg | ORAL_TABLET | Freq: Two times a day (BID) | ORAL | 0 refills | Status: DC

## 2021-10-10 NOTE — Progress Notes (Signed)
°  [facility] Adult Case Management Discharge Plan :  Will you be returning to the same living situation after discharge:  No. IRC and Theme park manager At discharge, do you have transportation home?: Yes,  Safe Transport and Bus Passes  Do you have the ability to pay for your medications: Yes,  Medicare   Release of information consent forms completed and in the chart;  Patient's signature needed at discharge.  Patient to Follow up at:  Follow-up Information     [facility]. Go to.   Specialty: Behavioral Health Why: Please go to this provider for therapy and medication management services during walk in hours:  Monday through Wednesday, from 7:45 am to 11:00 am.  Services are provided on a first come, first served basis. Contact information: [address] [place] [address] [phone]        [facility]. Go to.   Why: Please go to this facility to inquire about and begin services for therapy and medication management. Contact information: Address: [address] [place], [place] [address]  Phone: [phone]        [facility]. Go to.   Why: Please go to this facility to inquire about and begin services for therapy and medication management. Contact information: Address: [address] [place], [place] [address]  Phone: [phone]        [facility]. Go to.   Why: Please go to this facility to begin substance use services. You will need an ID and paperwork from your recent discharge. Contact information: Address: [address], [place], [place] [address]  Phone: [phone]                Next level of care provider has access to [facility] Link:yes  Safety Planning and Suicide Prevention discussed: Yes,  with patient      Has patient been referred to the Quitline?: Patient refused referral  Patient has been referred for addiction treatment:  Yes  Aram Beecham, LCSWA 10/10/2021, 9:44 AM

## 2021-10-10 NOTE — [facility] Suicide Risk Assessment (Signed)
[facility] Discharge Suicide Risk Assessment   Principal Problem: Alcohol use disorder, severe, dependence (HCC) Discharge Diagnoses: Principal Problem:   Alcohol use disorder, severe, dependence (HCC) Active Problems:   Severe recurrent major depression (HCC)   GAD (generalized anxiety disorder)   Total Time spent with patient: 20 minutes  Patient is a 45 year old male with a past psychiatric history of major depressive disorder, anxiety disorder, PTSD, and alcohol use disorder, who was admitted to the psychiatric unit for evaluation and treatment of worsening depression and suicidal thoughts to shoot himself with a gun. On day of discharge, pt reports that gun that pt spkoe of on admission, actually does not actually belong to nor is owned by the pt; but rather the pt reports that the gun is owned by his friend. Pt reports that friend is out of town, and that the pt does not have access or keys to the friend's home. On the day of discharge, he denies having access to any firearm.   During the patient's hospitalization, patient had extensive initial psychiatric evaluation, and follow-up psychiatric evaluations every day.  Psychiatric diagnoses provided upon initial assessment:  Alcohol use disorder, severe Major depressive disorder, recurrent, severe, without psychotic features Generalized anxiety disorder PTSD  Patient's psychiatric medications were adjusted on admission:  Continue Librium 25 mg po Q 6 hrs prn for CIWA > 10.  Continue gabapentin 300 mg po tid for agitation/withdrawal symptoms.  Continue Lorazepam 2 mg IM twice daily prn for withdrawal seizures. Continue Sertraline 50 mg po daily for depression. Continue all other adjunct medications for the management of associated withdrawal symptoms.  Continue Trazodone 50 mg po Q hs prn for insomnia. Continue Nicotine patch 21 mg trans-dermally for nicotine withdrawal sx.   During the hospitalization, other adjustments were made to the  patient's psychiatric medication regimen:  Sertraline was increased to 100 mg once daily Clonidine was re-started at 0.1 mg bid  Trazodone was increased to 100 mg qhs prn   Gradually, patient started adjusting to milieu.   Patient's care was discussed during the interdisciplinary team meeting every day during the hospitalization.  The patient denied having side effects to prescribed psychiatric medication.  The patient reports their target psychiatric symptoms of depression, suicidal thoughts, and alcohol withdrawal symptoms, all responded well to the psychiatric medications, and the patient reports overall benefit other psychiatric hospitalization. Supportive psychotherapy was provided to the patient. The patient also participated in regular group therapy while admitted.   Labs were reviewed with the patient, and abnormal results were discussed with the patient.  The patient denied having suicidal thoughts more than 24 hours prior to discharge.  Patient denies having homicidal thoughts, which was explored carefully with the patient (see note above about the ownership and acces to firearm).  Patient denies having auditory hallucinations.  Patient denies any visual hallucinations.  Patient denies having paranoid thoughts. Pt denies having symptoms of alcohol withdrawal, to this Clinical research associate, on day of discharge.  The patient is able to verbalize their individual safety plan to this provider.  It is recommended to the patient to continue psychiatric medications as prescribed, after discharge from the hospital.    It is recommended to the patient to follow up with your outpatient psychiatric provider and PCP.  Discussed with the patient, the impact of alcohol, drugs, tobacco have been there overall psychiatric and medical wellbeing, and total abstinence from substance use was recommended the patient.  Plan is for pt to go form this hospital to [facility], to get  an ID, so that he can go to [facility] for substance use residential live / work treatment.  Prescriptions will be provided to the patient at discharge. Bus ticket to [place] will be provided to patient at discharge.    Musculoskeletal: Strength & Muscle Tone: within normal limits Gait & Station: normal Patient leans: N/A  Psychiatric Specialty Exam  Presentation  General Appearance: Casual; Fairly Groomed  Eye Contact:Good  Speech:Normal Rate  Speech Volume:Normal  Handedness:Right   Mood and Affect  Mood:Euthymic  Duration of Depression Symptoms: Less than two weeks  Affect:Appropriate; Congruent; Full Range   Thought Process  Thought Processes:Linear  Descriptions of Associations:Intact  Orientation:Full (Time, Place and Person)  Thought Content:Logical  History of Schizophrenia/Schizoaffective disorder:No  Duration of Psychotic Symptoms:N/A  Hallucinations:Hallucinations: None Description of Auditory Hallucinations: +AH of music Description of Visual Hallucinations: +VH of rats crawling on the floor  Ideas of Reference:None  Suicidal Thoughts:Suicidal Thoughts: No  Homicidal Thoughts:Homicidal Thoughts: No   Sensorium  Memory:Immediate Good; Recent Good; Remote Good  Judgment:Fair  Insight:Fair   Executive Functions  Concentration:Good  Attention Span:Fair; Good  Recall:Fair  Fund of Knowledge:Fair  Language:Fair   Psychomotor Activity  Psychomotor Activity:Psychomotor Activity: Normal   Assets  Assets:Communication Skills   Sleep  Sleep:Sleep: Good   Physical Exam: Physical Exam Vitals reviewed.  Constitutional:      General: He is not in acute distress.    Appearance: He is normal weight. He is not ill-appearing or toxic-appearing.  Pulmonary:     Effort: Pulmonary effort is normal.  Neurological:     Mental Status: He is alert.     Motor: No weakness.     Gait: Gait normal.   Review of Systems  Constitutional:  Negative for chills  and fever.  Cardiovascular:  Negative for chest pain and palpitations.  Musculoskeletal:  Negative for myalgias.  Neurological:  Negative for dizziness, tingling, tremors and headaches.  Psychiatric/Behavioral:  Positive for substance abuse. Negative for depression, hallucinations, memory loss and suicidal ideas. The patient is not nervous/anxious and does not have insomnia.   All other systems reviewed and are negative.  Blood pressure (!) 160/88, pulse 96, temperature 97.8 F (36.6 C), temperature source Oral, resp. rate 18, height 5\' 9"  (1.753 m), weight 69.4 kg, SpO2 98 %. Body mass index is 22.59 kg/m.  Mental Status Per Nursing Assessment::   On Admission:  Suicidal ideation indicated by patient, Self-harm thoughts  Demographic factors:  Male, Divorced or widowed, Caucasian, Unemployed, Low socioeconomic status, Living alone Loss Factors:  Decrease in vocational status, Loss of significant relationship, Financial problems / change in socioeconomic status, Decline in physical health Historical Factors:  Prior suicide attempts, Impulsivity, Family history of mental illness or substance abuse Risk Reduction Factors:  Positive social support, Religious beliefs about death  Continued Clinical Symptoms:  Depression - resolved. Mood is improved. Sleep is improved. Denies SI. Denies HI. Alcohol withdrawal - symptoms resolved. Plan is for pt to go to [facility] in [place].   Cognitive Features That Contribute To Risk:  None    Suicide Risk:  Mild:  There are no identifiable suicide plans, no associated intent, mild dysphoria and related symptoms, good self-control (both objective and subjective assessment), few other risk factors, and identifiable protective factors, including available and accessible social support.   Follow-up Information     [facility]   Specialty: Addiction Medicine Why: Referral sent Contact information: [address]  [place]  [place] [address] [phone]         [facility]. Go to.   Specialty: Behavioral Health Why: Please go to this provider for therapy and medication management services during walk in hours:  Monday through Wednesday, from 7:45 am to 11:00 am.  Services are provided on a first come, first served basis. Contact information: [phone] [place] [address] [phone]                Plan Of Care/Follow-up recommendations:   Plan is for pt to go form this hospital to [facility], to get an ID, so that he can go to [place] [facility] for substance use residential live / work treatment.  Prescriptions will be provided to the patient at discharge. Bus ticket to [place] will be provided to patient at discharge.   Activity: as tolerated  Diet: heart healthy  Other: -Follow-up with your outpatient psychiatric provider -instructions on appointment date, time, and address (location) are provided to you in discharge paperwork.  -Take your psychiatric medications as prescribed at discharge - instructions are provided to you in the discharge paperwork  -Follow-up with outpatient primary care doctor and other specialists -for management of chronic medical disease, including: preventive medicine.  -Testing: Follow-up with outpatient provider for abnormal lab results: none  -Recommend abstinence from alcohol, tobacco, and other illicit drug use at discharge.   -If your psychiatric symptoms recur, worsening, or if you have side effects to your psychiatric medications, call your outpatient psychiatric provider, 911, [phone] or go to the nearest emergency department.  -If suicidal thoughts recur, call your outpatient psychiatric provider, 911, [phone] or go to the nearest emergency department.   Cristy Hilts, MD 10/10/2021, 9:33 AM

## 2021-10-10 NOTE — Progress Notes (Signed)
Pt discharged to lobby. Pt was stable and appreciative at that time. All papers and prescriptions were given and valuables returned. Verbal understanding expressed. Denies SI/HI and A/VH. Pt given opportunity to express concerns and ask questions.  

## 2021-10-10 NOTE — Discharge Summary (Signed)
Physician Discharge Summary Note  Patient:  [name] [name] is an 45 y.o., male MRN:  [id number] DOB:  07-10-1976 Patient phone:  [phone] (home)  Patient address:   [place] [address],  Total Time spent with patient: 20 minutes  Date of Admission:  10/05/2021 Date of Discharge: 10/10/2021  Reason for Admission:     Patient is a 45 year old male with a past psychiatric history of major depressive disorder, anxiety disorder, PTSD, and alcohol use disorder, who was admitted to the psychiatric unit for evaluation and treatment of worsening depression and suicidal thoughts to shoot himself with a gun.       Principal Problem: Alcohol use disorder, severe, dependence ([place]) Discharge Diagnoses: Principal Problem:   Alcohol use disorder, severe, dependence ([place]) Active Problems:   Severe recurrent major depression ([place])   GAD (generalized anxiety disorder)   Past Psychiatric History:  The patient reports history of being diagnosed with major depressive disorder, anxiety disorder, PTSD.  He denies having a history of being diagnosed with bipolar disorder in the past.  Patient reports he was hospitalized psychiatrically about 5 times during his lifetime, most recently 1 year ago for suicidal thoughts.  He reports a history of suicide attempt, by overdose.  He reports he is not currently taking any psychiatric medications.  He reports in the past, he was prescribed Zoloft, unknown dose, for depression and PTSD, which was effective.  He reports being prescribed gabapentin in the past, unknown dose, for mood stability and pain, which was effective.  He reports being prescribed Zyprexa in the past, unknown dose, "it helped to clear my thoughts".  Past Medical History:  Past Medical History:  Diagnosis Date   Bipolar disorder ([place])    Cellulitis    Depression    Drug abuse ([place])    Hepatitis C     Past Surgical History:  Procedure Laterality Date   BACK  SURGERY     ROTATOR CUFF REPAIR     SHOULDER SURGERY     Family History:  Family History  Problem Relation Age of Onset   Alcohol abuse Father    Cancer Maternal Aunt    Family Psychiatric  History: Patient denies having family psychiatric history.  Patient denies having family history of suicide attempt.    Social History:  Social History   Substance and Sexual Activity  Alcohol Use Yes   Comment: 6-7 bottles of wine per day     Social History   Substance and Sexual Activity  Drug Use Not Currently   Types: Marijuana, Benzodiazepines, Other-see comments, Methamphetamines, Cocaine   Comment: denies    Social History   Socioeconomic History   Marital status: Divorced    Spouse name: Not on file   Number of children: 2   Years of education: Not on file   Highest education level: GED or equivalent  Occupational History   Not on file  Tobacco Use   Smoking status: Every Day    Packs/day: 1.00    Years: 31.00    Pack years: 31.00    Types: Cigarettes    Start date: 10/22/1981   Smokeless tobacco: Never  Vaping Use   Vaping Use: Never used  Substance and Sexual Activity   Alcohol use: Yes    Comment: 6-7 bottles of wine per day   Drug use: Not Currently    Types: Marijuana, Benzodiazepines, Other-see comments, Methamphetamines, Cocaine    Comment: denies   Sexual activity: Not Currently  Other Topics Concern   Not on file  Social History Narrative   Not on file   Social Determinants of Health   Financial Resource Strain: Not on file  Food Insecurity: Not on file  Transportation Needs: Not on file  Physical Activity: Not on file  Stress: Not on file  Social Connections: Not on file    Hospital Course:     During the patient's hospitalization, patient had extensive initial psychiatric evaluation, and follow-up psychiatric evaluations every day.  Psychiatric diagnoses provided upon initial assessment:  Alcohol use disorder, severe Major depressive  disorder, recurrent, severe, without psychotic features Generalized anxiety disorder PTSD  Patient's psychiatric medications were adjusted on admission:  Continue Librium 25 mg po Q 6 hrs prn for CIWA > 10.  Continue gabapentin 300 mg po tid for agitation/withdrawal symptoms.  Continue Lorazepam 2 mg IM twice daily prn for withdrawal seizures. Continue Sertraline 50 mg po daily for depression. Continue all other adjunct medications for the management of associated withdrawal symptoms.  Continue Trazodone 50 mg po Q hs prn for insomnia. Continue Nicotine patch 21 mg trans-dermally for nicotine withdrawal sx.   During the hospitalization, other adjustments were made to the patient's psychiatric medication regimen:  Sertraline was increased to 100 mg once daily Clonidine was re-started at 0.1 mg bid  Trazodone was increased to 100 mg qhs prn   Patient's care was discussed during the interdisciplinary team meeting every day during the hospitalization.  The patient denied having side effects to prescribed psychiatric medication.  Gradually, patient started adjusting to milieu. The patient was evaluated each day by a clinical provider to ascertain response to treatment. Improvement was noted by the patient's report of decreasing symptoms, improved sleep and appetite, affect, medication tolerance, behavior, and participation in unit programming.  Patient was asked each day to complete a self inventory noting mood, mental status, pain, new symptoms, anxiety and concerns.   Symptoms were reported as significantly decreased or resolved completely by discharge.  The patient reports that their mood is stable.  The patient denied having suicidal thoughts for more than 48 hours prior to discharge.  Patient denies having homicidal thoughts.  On day of discharge, pt reports that gun that pt spkoe of on admission, actually does not actually belong to nor is owned by the pt; but rather the pt reports that the  gun is owned by his friend. Pt reports that friend is out of town, and that the pt does not have access or keys to the friend's home. On the day of discharge, he denies having access to any firearm.  Patient denies having auditory hallucinations.  Patient denies any visual hallucinations or other symptoms of psychosis.  The patient was motivated to continue taking medication with a goal of continued improvement in mental health.   The patient reports their target psychiatric symptoms of depression, suicidal thoughts, and alcohol withdrawal symptoms, all responded well to the psychiatric medications, and the patient reports overall benefit other psychiatric hospitalization. Supportive psychotherapy was provided to the patient. The patient also participated in regular group therapy while hospitalized. Coping skills, problem solving as well as relaxation therapies were also part of the unit programming.  Labs were reviewed with the patient, and abnormal results were discussed with the patient.  The patient is able to verbalize their individual safety plan to this provider.  # It is recommended to the patient to continue psychiatric medications as prescribed, after discharge from the hospital.    # It  is recommended to the patient to follow up with your outpatient psychiatric provider and PCP.  # It was discussed with the patient, the impact of alcohol, drugs, tobacco have been there overall psychiatric and medical wellbeing, and total abstinence from substance use was recommended the patient.ed.  # Prescriptions provided or sent directly to preferred pharmacy at discharge. Patient agreeable to plan. Given opportunity to ask questions. Appears to feel comfortable with discharge.    # In the event of worsening symptoms, the patient is instructed to call the crisis hotline, 911 and or go to the nearest ED for appropriate evaluation and treatment of symptoms. To follow-up with primary care provider for  other medical issues, concerns and or health care needs  # Patient was discharged home with a plan to follow up as noted below: Plan is for pt to go form this hospital to [facility], to get an ID, so that he can go to [place] for substance use residential live / work treatment.  Prescriptions will be provided to the patient at discharge. Bus ticket to [place] will be provided to patient at discharge.    Physical Findings: AIMS:  , ,  ,  ,    CIWA:  CIWA-Ar Total: 1 COWS:     Musculoskeletal: Strength & Muscle Tone: within normal limits Gait & Station: normal Patient leans: N/A   Psychiatric Specialty Exam:  Presentation  General Appearance: Casual; Fairly Groomed  Eye Contact:Good  Speech:Normal Rate  Speech Volume:Normal  Handedness:Right   Mood and Affect  Mood:Euthymic  Affect:Appropriate; Congruent; Full Range   Thought Process  Thought Processes:Linear  Descriptions of Associations:Intact  Orientation:Full (Time, Place and Person)  Thought Content:Logical  History of Schizophrenia/Schizoaffective disorder:No  Duration of Psychotic Symptoms:N/A  Hallucinations:Hallucinations: None Description of Auditory Hallucinations: +AH of music Description of Visual Hallucinations: +VH of rats crawling on the floor  Ideas of Reference:None  Suicidal Thoughts:Suicidal Thoughts: No  Homicidal Thoughts:Homicidal Thoughts: No   Sensorium  Memory:Immediate Good; Recent Good; Remote Good  Judgment:Fair  Insight:Fair   Executive Functions  Concentration:Good  Attention Span:Fair; [place]   Psychomotor Activity  Psychomotor Activity:Psychomotor Activity: Normal   Assets  Assets:Communication Skills   Sleep  Sleep:Sleep: Good    Physical Exam: Physical Exam Vitals reviewed.  Constitutional:      General: He is not in acute distress.    Appearance: He is normal weight. He is  not ill-appearing or toxic-appearing.  Pulmonary:     Effort: Pulmonary effort is normal.  Neurological:     Mental Status: He is alert.     Motor: No weakness.     Gait: Gait normal.  Psychiatric:        Mood and Affect: Mood normal.        Thought Content: Thought content normal.   Review of Systems  Constitutional:  Negative for chills and fever.  Cardiovascular:  Negative for chest pain and palpitations.  Musculoskeletal:  Negative for myalgias.  Neurological:  Negative for dizziness, tingling, tremors and headaches.  Psychiatric/Behavioral:  Positive for substance abuse. Negative for depression, hallucinations, memory loss and suicidal ideas. The patient is not nervous/anxious and does not have insomnia.   All other systems reviewed and are negative.  Blood pressure (!) 160/88, pulse 96, temperature 97.8 F (36.6 C), temperature source Oral, resp. rate 18, height 5\' 9"  (1.753 m), weight 69.4 kg, SpO2 98 %. Body mass index is 22.59 kg/m.   Social History  Tobacco Use  Smoking Status Every Day   Packs/day: 1.00   Years: 31.00   Pack years: 31.00   Types: Cigarettes   Start date: 10/22/1981  Smokeless Tobacco Never   Tobacco Cessation:   A prescription for an FDA-approved tobacco cessation medication provided at discharge   Blood Alcohol level:  Lab Results  Component Value Date   ETH 199 (H) 10/05/2021   ETH 46 (H) 09/21/2021    Metabolic Disorder Labs:  Lab Results  Component Value Date   HGBA1C 5.4 09/22/2021   MPG 108.28 09/22/2021   MPG 117 04/05/2021   No results found for: PROLACTIN Lab Results  Component Value Date   CHOL 170 10/10/2021   TRIG 86 10/10/2021   HDL 64 10/10/2021   CHOLHDL 2.7 10/10/2021   VLDL 17 10/10/2021   LDLCALC 89 10/10/2021   LDLCALC 66 09/22/2021    See Psychiatric Specialty Exam and Suicide Risk Assessment completed by Attending Physician prior to discharge.  Discharge destination:  see above, to [facility] in  [place], (bus ticket provided at discharge)  Is patient on multiple antipsychotic therapies at discharge:  No   Has Patient had three or more failed trials of antipsychotic monotherapy by history:  No  Recommended Plan for Multiple Antipsychotic Therapies: NA  Discharge Instructions     Diet - low sodium heart healthy   Complete by: As directed    Increase activity slowly   Complete by: As directed       Allergies as of 10/10/2021       Reactions   Lithium Anaphylaxis   Penicillins Rash   Has patient had a PCN reaction causing immediate rash, facial/tongue/throat swelling, SOB or lightheadedness with hypotension: Yes Has patient had a PCN reaction causing severe rash involving mucus membranes or skin necrosis: No Has patient had a PCN reaction that required hospitalization: No Has patient had a PCN reaction occurring within the last 10 years: No If all of the above answers are "NO", then may proceed with Cephalosporin use.        Medication List     STOP taking these medications    hydrOXYzine 25 MG tablet Commonly known as: ATARAX       TAKE these medications      Indication  cloNIDine 0.1 MG tablet Commonly known as: CATAPRES Take 1 tablet (0.1 mg total) by mouth 2 (two) times daily. What changed:  when to take this additional instructions  Indication: High Blood Pressure Disorder, alcohol withdrawal   gabapentin 300 MG capsule Commonly known as: NEURONTIN Take 1 capsule (300 mg total) by mouth 3 (three) times daily. 7 days supply for alcohol withdrawals  Indication: Alcohol Withdrawal Syndrome   multivitamin with minerals Tabs tablet Take 1 tablet by mouth daily.  Indication: Nutritional Support   nicotine 21 mg/24hr patch Commonly known as: NICODERM CQ - dosed in mg/24 hours Place 1 patch (21 mg total) onto the skin daily. Start taking on: October 11, 2021 What changed:  when to take this additional instructions  Indication: Nicotine  Addiction   sertraline 100 MG tablet Commonly known as: ZOLOFT Take 1 tablet (100 mg total) by mouth daily. Start taking on: October 11, 2021 What changed:  medication strength how much to take additional instructions  Indication: Major Depressive Disorder   thiamine 100 MG tablet Take 1 tablet (100 mg total) by mouth daily.  Indication: Deficiency of Vitamin B1   traZODone 100 MG tablet Commonly known as: DESYREL Take 1  tablet (100 mg total) by mouth at bedtime as needed for sleep. What changed:  medication strength how much to take additional instructions  Indication: [place], Major Depressive Disorder        Follow-up [place]. Go to.   Specialty: Behavioral Health Why: Please go to this provider for therapy and medication management services during walk in hours:  Monday through Wednesday, from 7:45 am to 11:00 am.  Services are provided on a first come, first served basis. Contact information: [place] [place]. Go to.   Why: Please go to this facility to inquire about and begin services for therapy and medication management. Contact information: Address: [address] [place], [place] [address]  Phone: [phone]        [facility] [place]. Go to.   Why: Please go to this facility to inquire about and begin services for therapy and medication management. Contact information: Address: [address] [place], [place] [address]  Phone: [phone]        [facility]. Go to.   Why: Please go to this facility to begin substance use services. You will need an ID and paperwork from your recent discharge. Contact information: Address: [address], [place], [place] [address]  Phone: [phone]                Follow-up recommendations:    Plan is for pt to go  form this hospital to [facility], to get an ID, so that he can go to [place] for substance use residential live / work treatment.  Prescriptions will be provided to the patient at discharge. Bus ticket to [place] will be provided to patient at discharge.    Activity: as tolerated   Diet: heart healthy   Other: -Follow-up with your outpatient psychiatric provider -instructions on appointment date, time, and address (location) are provided to you in discharge paperwork.   -Take your psychiatric medications as prescribed at discharge - instructions are provided to you in the discharge paperwork   -Follow-up with outpatient primary care doctor and other specialists -for management of chronic medical disease, including: preventive medicine.   -Testing: Follow-up with outpatient provider for abnormal lab results: none   -Recommend abstinence from alcohol, tobacco, and other illicit drug use at discharge.    -If your psychiatric symptoms recur, worsening, or if you have side effects to your psychiatric medications, call your outpatient psychiatric provider, 911, [phone] or go to the nearest emergency department.   -If suicidal thoughts recur, call your outpatient psychiatric provider, 911, [phone] or go to the nearest emergency department.   Signed: Christoper Allegra, MD 10/10/2021, 1:02 PM   Total Time Spent in Direct Patient Care:  I personally spent 45 minutes on the unit in direct patient care. The direct patient care time included face-to-face time with the patient, reviewing the patient's chart, communicating with other professionals, and coordinating care. Greater than 50% of this time was spent in counseling or coordinating care with the patient regarding goals of hospitalization, psycho-education, and discharge planning needs.   Janine Limbo, MD Psychiatrist

## 2022-08-28 ENCOUNTER — Inpatient Hospital Stay
Admit: 2022-08-28 | Discharge: 2022-08-28 | Disposition: A | Discharge status: Other Acute Inpatient Hospital | Payer: MEDICAID | Source: Home / Self Care

## 2022-08-28 DIAGNOSIS — R45851 Suicidal ideations: Secondary | ICD-10-CM

## 2022-08-28 DIAGNOSIS — F200 Paranoid schizophrenia: Secondary | ICD-10-CM

## 2022-08-28 LAB — Lipase: LIPASE: 42 U/L (ref 13–69)

## 2022-08-28 LAB — Magnesium: MAGNESIUM: 1.7 meq/L (ref 1.4–1.9)

## 2022-08-28 LAB — Hepatic Funct Panel: ASPARTATE AMINOTRANSFERASE: 35 U/L (ref 13–62)

## 2022-08-28 LAB — Basic Metabolic Panel
TOTAL CO2: 25 mmol/L (ref 20–30)
TOTAL CO2: 25 mmol/L (ref 20–30)

## 2022-08-28 LAB — Differential Automated: ABSOLUTE EOS COUNT: 0.33 10*3/uL (ref 0.00–0.50)

## 2022-08-28 LAB — Amylase: AMYLASE: 70 U/L (ref 31–124)

## 2022-08-28 LAB — CBC: RED BLOOD CELL COUNT: 4.72 x10E6/uL (ref 4.41–5.95)

## 2022-08-28 LAB — Expedited COVID-19 and Influenza A B PCR: COVID-19 PCR/TMA: NOT DETECTED

## 2022-08-28 LAB — Drug Screen,serum: SALICYLATE,SERUM: <3 mg/dL (ref <=30.0)

## 2022-08-28 LAB — Drugs of Abuse Scn,Ur: COCAINE: NEGATIVE ng/mL

## 2022-08-28 LAB — Oxycodone Urine: OXYCODONE: NEGATIVE ng/mL

## 2022-08-28 LAB — Phosphorus: PHOSPHORUS: 3.2 mg/dL (ref 2.3–4.4)

## 2022-08-28 LAB — Prothrombin Time Panel: PROTHROMBIN TIME: 13 s (ref 11.5–14.4)

## 2022-08-28 MED ADMIN — OLANZAPINE 5 MG PO TBDP: 10 mg | ORAL | @ 12:00:00 | Stop: 2022-08-28 | NDC 64380017202

## 2022-08-28 MED ADMIN — LORAZEPAM 1 MG PO TABS: 1 mg | ORAL | @ 12:00:00 | Stop: 2022-08-28 | NDC 69315090501

## 2022-08-28 NOTE — ED Notes
Spoke with Daisy from [facility]. All questions answered.

## 2022-08-28 NOTE — ED Notes
Report given to [facility] at [facility].

## 2022-08-28 NOTE — ED Provider Notes
[facility]  Emergency Department Service Report    William Soto 46 y.o. male , presents with Psychiatric Evaluation      Triage   Arrived on 08/28/2022 at 2:53 AM   Arrived by Walk-in [14]    ED Triage Vitals   Temp Temp Source BP Heart Rate Resp SpO2 O2 Device Pain Score Weight   08/28/22 0303 08/28/22 0303 08/28/22 0303 08/28/22 0303 08/28/22 0303 08/28/22 0303 08/28/22 0303 -- 08/28/22 0305   36.7 ?C (98.1 ?F) Oral 149/89 77 18 94 % None (Room air)  74.8 kg (165 lb)       Pre hospital care:       No Known Allergies    History   HPI       William Soto is a 46 y.o. male with a history of housing insecurity and paranoid schizophrenia on Geodon who presents to the ED for evaluation of auditory hallucinations for the last 3-4 weeks. Patient reports that sometimes the voices tell him to jump off a bridge and sometimes they;re just generalized voices. He states that he hasn't listened to the voices because he wants to live. Per patient, he has not taken Geodon in three months. 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 Also reports having family local to the area but he is unable to stay with the. Patient states that he would like to go back to [place] at this time. Patient endorses smoking cigarettes but denies any recreational drug use.     History reviewed. No pertinent past medical history.     History reviewed. No pertinent surgical history.     Past Family History   Family history reviewed by me and there is no pertinent past family history related to the patient's current case and/or care.       Past Social History   he reports that he has been smoking cigarettes. He has never used smokeless tobacco. He reports current alcohol use. No history on file for drug use and sexual activity.       Physical Exam   Physical Exam  Vitals and nursing note reviewed.   Constitutional:       General: He is not in acute distress.  HENT:      Head: Normocephalic and atraumatic.   Eyes:      Conjunctiva/sclera: Conjunctivae normal.   Cardiovascular:      Rate and Rhythm: Normal rate and regular rhythm.   Pulmonary:      Effort: Pulmonary effort is normal. No respiratory distress.      Breath sounds: Normal breath sounds.   Abdominal:      Palpations: Abdomen is soft.      Tenderness: There is no abdominal tenderness. There is no guarding or rebound.   Musculoskeletal:         General: Normal range of motion.      Cervical back: Normal range of motion and neck supple.   Lymphadenopathy:      Cervical: No cervical adenopathy.   Skin:     General: Skin is warm and dry.   Neurological:      Mental Status: He is alert.      Sensory: No sensory deficit.   Psychiatric:         Behavior: Behavior normal.         ED Course     ED Course  as of 08/28/22 0433   Tue Aug 28, 2022   0339 Spoke with CW. Does not believe patient needs to be placed on an involuntary hold at this time.  [CS]      ED Course User Index  [CS] Barbaraann Rondo       Laboratory Results     Labs Reviewed   CBC (PERFORMABLE) - Abnormal; Notable for the following components:       Result Value    White Blood Cell Count 11.09 (*)     All other components within normal limits   DIFFERENTIAL, AUTOMATED (PERFORMABLE) - Abnormal; Notable for the following components:    Absolute Mono Count 1.34 (*)     All other components within normal limits   EXPEDITED COVID-19 AND INFLUENZA A B PCR, RESPIRATORY UPPER   CBC & AUTO DIFFERENTIAL    Narrative:     The following orders were created for panel order CBC & Plt & Diff.  Procedure                               Abnormality         Status                     ---------                               -----------         ------                     UUV[[id number]]                          Abnormal            Final result               Differential, Automated[[id number]]      Abnormal            Final result                 Please view results for these tests on the individual orders.   MAGNESIUM   PHOSPHORUS   BASIC METABOLIC PANEL   PROTHROMBIN TIME PANEL   HEPATIC FUNCT PANEL   LIPASE   AMYLASE   POCT URINE PREGNANCY   DRUGS OF ABUSE SCN,UR   DRUGS OF ABUSE SCN,UR   SEDATIVE-HYPNOTIC DRUG SCREEN, SERUM       Imaging Results     No orders to display       Administered Medications     Medication Administration from 08/28/2022 0253 to 08/28/2022 0433         Date/Time Order Dose Route Action Action by Comments     08/28/2022 0412 PST OLANZapine tab disolv 10 mg 10 mg Oral Given Charlesetta Shanks, RN --     08/28/2022 0411 PST LORazepam tab 1 mg 1 mg Oral Given Charlesetta Shanks, RN --            Procedures   Procedural Sedation  Procedures    Medical Decision Making   [name] is a 46 y.o. male with a history of housing insecurity and paranoid schizophrenia on Geodon who presents to the ED for evaluation of auditory hallucinations for the last 3-4 weeks.     Medical Decision Making:  Patient  presents with psychiatric complaint. Suicidal thoughts  Medical clearance work up was initiated. Social work consultation was performed  Labs were performed to evaluate for evidence of electrolyte abnormalities such as hyponatremia, hyper/hypoglycemia. CBC to evaluate for leukocytosis, anemia. LFT?s and lipase were performed to evaluate for evidence of pancreatitis or hepatitis. Urine testing done for any signs of infection. Laboratory evaluation done for signs of intoxication, poisoning  Patients pain was treated with Zyprexa and Ativan medications with good response.  Patient improved in the ER. Patient is medically cleared for further psychiatric evaluation   Etiology of symptoms likely SI with h/o paranoid schizophrenia.   Explained risk and benefits of all medications given and prescribed      Medical Decision Making  Amount and/or Complexity of Data Reviewed  Labs: ordered.    Risk  Prescription drug management.        SMOKING CESSATION  I spent >3 minutes discussing with the patient regarding smoking cessation.  I strongly recommended to the patient to stop smoking.  It is the single most important thing the patient can do to improve their health.  Patient states they are willing to quit and will try the following strategy:  These are the strategies (plans) that are available to assist you to stop smoking or using other forms of tobacco or nicotine: Nicotine gum, nicotine inhaler or ask primary care doctor.  Please see attached carenote for further detail   Clinical Impression     1. Suicidal ideations    2. Paranoid schizophrenia (HCC/RAF)          Prescriptions     New Prescriptions    No medications on file       Disposition and Follow-up   Disposition: Transfer to Another Facility [2]    No future appointments.    Follow up with:  No follow-up provider specified.    Return precautions are specified on After Visit Summary.          I, Willadean Carol, have acted as a Stage manager for patient Dhruv Christina on behalf of Dr. Rubye Beach at 08/28/2022 at 3:01 AM. All documentation underwent a comprehensive review by the listed physician(s) and received their approval upon signing.

## 2022-08-28 NOTE — ED Notes
Pt leaving for [facility]. Pt quiet but intractive. No distress. Denies discomforts when asked.

## 2022-08-28 NOTE — Progress Notes
Per RN, pt has been accepted to [facility] for voluntary admission. RN states [facility] wants transportation arranged after 9am. As such,  SW arranged transportation for pick up after 9am.     Patient signed the transfer acknowledgement form.

## 2022-08-28 NOTE — ED Notes
Report received from Kristen RN.

## 2022-08-28 NOTE — Consults
CLINICAL SOCIAL WORKER BRIEF ASSESSMENT      Admit Date:    Date of CSW Assessment: 08/28/2022    Problems: Active Problems:    * No active hospital problems. *       History reviewed. No pertinent past medical history. History reviewed. No pertinent surgical history.       Primary Care Physician:No primary care provider on file.  Phone:None       ASSESSMENT:     Date Seen: 08/28/22    Date Referred: 08/28/22    Referral Source: MD, RN, Patient, CSW    Reason for Referral: Assessment of Support System, [employer], Goals of Care, History of Mental Health, History of Depression, Limited Support System, Medication Access, Patient Education, Trauma Assessment    Source of Information: Consultation, Review of medical record, Persons interviewed       Consult Source: MD, RN, Patient, CSW                    Does the patient have a Family/Support System member participating in Discharge Planning? No    Family/Support System in agreement with the current discharge plan? No, they are not participating    lnterventions: Discussed role of CSW and informed CSW availability for support and resources, Gathered history, Provided education regarding, Processed and explored feelings related to diagnosis/treatment, Provided empathetic listening and emotional support, Provided crisis intervention regarding, Discussed health/safety issues, Provided concrete resource assistance, Assessed high risk issues, Assessed for barriers to discharge, Discussed resources available     Chief complaint: +SI. Patient states, ''I'm going to jump off a bridge.'' States he feels paranoid and that people are following him. Per patient he has been having these thoughts for 2 weeks.     CSW met with patient to introduce self, explain CSW role, and provide resources and support as needed. CSW met with patient at bedside. Patient presents as disheveled, ADLs poor, unshaven. Patient dressed appropriately for weather. Patient presents as jittery and makes frequent and rapid jerky movements of arms and hands. Patient is alert and oriented in all spheres, mood appears anxious and depressed, affect is mood congruent. Patient reports active SI with plan to jump off a bridge, denies HI, denies VH, reports command AH to harm self. Patient reports poor sleep, good appetite, insight is fair, judgement is poor. Eye contact is intermittent.     Patient states for the past 2 weeks he has been feeling increasingly depressed and anxious. Patient states he hears voices telling him to kill himself by jumping off a bridge. Patient reports he hears voices at baseline but the voices at present are more insistent and patient feels he would act on the voices if released form the hospital. Patient also reports an increase in his paranoia and feels that people are following him. Patient reports vague HI towards those he believes are following him but does not know who those individuals are.     Patient states he has a history of paranoid schizophrenia, has been on medications in the past but is not currently taking them. Patient reports a history of 10 prior inpatient psychiatric admissions with the most recent being 2 years ago. Patient also reports a history of one prior suicide attempt by overdose on blood pressure medication 3 years ago. Patient states he was first diagnosed with paranoid schizophrenia when he was 46 y.o. Patient reports he was most recently on Geodon and Haldol with good effect but does not know the doses. Patient  states the last time he took his medications was 2 weeks ago. Patient denies any history of active substance abuse, denies any alcohol abuse or dependence.     Patient reports a social history of being born and raised in [place], came out to [place] by bus 2 weeks ago. Patient states he has family [place] of [place] in [place]. 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 Patient reports he is currently homeless and has been living on the streets, not going to shelters. Patient has some financial resources, receives $1000 per month in SSI, denies food stamps. Patient denies any legal issues, denies any acute medical concerns, denies any allergies.     Outcome:  Patient reports increased psychotic symptoms along including command AH to commit suicide by jumping off a bridge. Patient also reports increased paranoia and suspicious along with vague HI. Patient is requesting treatment for his mental health issues and is voluntary for admission.     Plan/Recommendations: Patient will be referred to an inpatient psychiatric facility for a voluntary admission.             Projected Date of Discharge: 08/28/2022      Conni Elliot, LCSW,  08/28/2022

## 2022-10-08 DIAGNOSIS — R45850 Homicidal ideations: Secondary | ICD-10-CM

## 2022-10-08 DIAGNOSIS — F23 Brief psychotic disorder: Secondary | ICD-10-CM

## 2022-10-08 DIAGNOSIS — R45851 Suicidal ideations: Secondary | ICD-10-CM

## 2022-10-09 ENCOUNTER — Inpatient Hospital Stay
Admit: 2022-10-09 | Discharge: 2022-10-09 | Disposition: A | Discharge status: Other Acute Inpatient Hospital | Payer: PRIVATE HEALTH INSURANCE | Source: Home / Self Care

## 2022-10-09 LAB — Lipase: LIPASE: 43 U/L (ref 13–69)

## 2022-10-09 LAB — Prothrombin Time Panel: INR: 1.1 s (ref 11.5–14.4)

## 2022-10-09 LAB — Oxycodone Urine: OXYCODONE: NEGATIVE ng/mL

## 2022-10-09 LAB — Magnesium: MAGNESIUM: 1.6 meq/L (ref 1.4–1.9)

## 2022-10-09 LAB — Basic Metabolic Panel
ANION GAP: 10 mmol/L (ref 8–19)
GLUCOSE: 128 mg/dL — ABNORMAL HIGH (ref 65–99)

## 2022-10-09 LAB — Phosphorus: PHOSPHORUS: 3.2 mg/dL (ref 2.3–4.4)

## 2022-10-09 LAB — Differential Automated: ABSOLUTE MONO COUNT: 1.22 10*3/uL — ABNORMAL HIGH (ref 0.20–0.80)

## 2022-10-09 LAB — Drug Screen,serum: ALCOHOL,ETHYL,SERUM: <15 mg/dL (ref <=30.0)

## 2022-10-09 LAB — Amylase: AMYLASE: 56 U/L (ref 31–124)

## 2022-10-09 LAB — Drugs of Abuse Scn,Ur: HYDROCODONE: NEGATIVE ng/mL

## 2022-10-09 LAB — CBC: RED BLOOD CELL COUNT: 3.82 x10E6/uL — ABNORMAL LOW (ref 4.41–5.95)

## 2022-10-09 LAB — Hepatic Funct Panel: BILIRUBIN,CONJUGATED: <0.2 mg/dL (ref 8–<=0.3)

## 2022-10-09 MED ADMIN — OLANZAPINE 10 MG IM SOLR: 10 mg | INTRAMUSCULAR | @ 04:00:00 | Stop: 2022-10-09 | NDC 00517095501

## 2022-10-09 NOTE — ED Notes
Assumed care of patient. Security at bedside.

## 2022-10-09 NOTE — ED Provider Notes
[facility]  Emergency Department Service Report    William Soto 46 y.o. male , presents with Suicidal      Triage   Arrived on 10/08/2022 at 5:36 PM   Arrived by Car [5]    ED Triage Vitals   Temp Temp Source BP Heart Rate Resp SpO2 O2 Device Pain Score Weight   10/08/22 1739 10/08/22 1739 10/08/22 1739 10/08/22 1739 10/08/22 1739 10/08/22 1739 -- 10/08/22 1750 --   36.6 ?C (97.9 ?F) Oral 116/72 94 20 98 %  Zero        Pre hospital care:       No Known Allergies    History   HPI     Patient is a 46 y.o. male with hx of anxiety and depression who presents to the ED with complaint of suicidal thoughts. Pt reports that he has suicidal thoughts and homicidal ideations. Pt states that he has not been taking his psych medications but has no concrete plan for SI or HI.         Past Medical History:   Diagnosis Date    Anxiety     Depression         No past surgical history on file.     Past Family History   Family history reviewed by me and there is no pertinent past family history related to the patient's current case and/or care.             Past Social History   he reports that he has been smoking cigarettes. He has never used smokeless tobacco. He reports current alcohol use. No history on file for drug use and sexual activity.       Physical Exam   Physical Exam  Vitals and nursing note reviewed.   Constitutional:       General: He is not in acute distress.     Appearance: He is well-developed.   HENT:      Head: Normocephalic and atraumatic.   Eyes:      Conjunctiva/sclera: Conjunctivae normal.   Cardiovascular:      Rate and Rhythm: Normal rate and regular rhythm.      Pulses: Normal pulses.   Pulmonary:      Effort: Pulmonary effort is normal. No respiratory distress.   Abdominal:      General: There is no distension.      Palpations: Abdomen is soft.      Tenderness: There is no abdominal tenderness. There is no guarding or rebound.   Musculoskeletal:         General: Normal range of motion. Cervical back: Normal range of motion and neck supple.   Skin:     General: Skin is warm and dry.   Neurological:      General: No focal deficit present.      Mental Status: He is alert and oriented to person, place, and time.   Psychiatric:         Mood and Affect: Mood normal.         Behavior: Behavior normal.      Comments: Suicidal ideation  Homicidal ideation         ED Course          Laboratory Results     Labs Reviewed   BASIC METABOLIC PANEL - Abnormal; Notable for the following components:       Result Value    Potassium 3.3 (*)     Glucose 128 (*)  Creatinine 0.52 (*)     All other components within normal limits   CBC (PERFORMABLE) - Abnormal; Notable for the following components:    Red Blood Cell Count 3.82 (*)     Hemoglobin 11.8 (*)     Hematocrit 34.5 (*)     All other components within normal limits   DIFFERENTIAL, AUTOMATED (PERFORMABLE) - Abnormal; Notable for the following components:    Absolute Mono Count 1.22 (*)     All other components within normal limits   DRUGS OF ABUSE SCN,UR - Abnormal; Notable for the following components:    Amphetamine/Meth Positive (*)     Cannabinoids Positive (*)     All other components within normal limits    Narrative:     If the screen result is not consistent with the patient's medication(s), confirmation testing should be ordered for the drug(s) of interest.    Unconfirmed screening results should be used only for medical (i.e., treatment) purposes and not for non-medical purposes (e.g., employment testing, legal testing.)    Phencyclidine testing is not included in the drug screen. It can be ordered separately (test [phone]).   SEDATIVE-HYPNOTIC DRUG SCREEN, SERUM - Abnormal; Notable for the following components:    Acetaminophen,serum <10 (*)     All other components within normal limits    Narrative:     Unconfirmed screening results should be used only for medical (i.e.,treatment) purposes and not for non-medical purposes (e.g.,employment testing, legal testing).   MAGNESIUM - Normal   PHOSPHORUS - Normal   PROTHROMBIN TIME PANEL - Normal   HEPATIC FUNCT PANEL - Normal   LIPASE - Normal   AMYLASE - Normal   CBC & AUTO DIFFERENTIAL    Narrative:     The following orders were created for panel order CBC & Plt & Diff.  Procedure                               Abnormality         Status                     ---------                               -----------         ------                     EAV[[id number]]                          Abnormal            Final result               Differential, Automated[[id number]]      Abnormal            Final result                 Please view results for these tests on the individual orders.   OXYCODONE, URINE   POCT URINALYSIS DIPSTICK       Imaging Results     No orders to display       Administered Medications     Medication Administration from 10/08/2022 1736 to 10/08/2022 2125         Date/Time Order Dose Route Action Action by Comments  10/08/2022 2002 PST OLANZapine inj 10 mg 10 mg Intramuscular Given Desma Mcgregor, RN --            Procedures   Procedural Sedation  Procedures    Medical Decision Making     Medical Decision Making  Amount and/or Complexity of Data Reviewed  Labs: ordered.    Risk  Prescription drug management.        MDM: Patient presents to the emergency room with chief complaint of acute psychosis.      Patient evaluated for acute causes of acute psychosis.  Patient's acute psychosis likely secondary to the patient's baseline psychiatric illness.    PAtient was evaluated for medical clearance.     Patient's labs reviewed by myself showing no evidence of acute abnormality. Labs reviewed with no evidence of renal failure or uremia as etiology for the patient's acute psychosis. No evidence of sepsis or infectious etiology as the patient's cause for acute psychosis.     Patient with significant risk for morbidity mortality secondary to stabilization of the patient's acute psychosis and evaluation for potential coingestions, self-harm, potential for self-harm or harm to others.    High complexity for medical decision making requiring medical clearance, psychiatric stabilization, discussion with social work and psychiatry for evaluation of the patient and placement of the patient.    Patient's acute psychosis likely secondary to his psychiatric illness.   Patient is medically clear for psychiatric evaluation for 5150 psychiatric hold. No other acute issues at this time. Patient pending transfer.     Patient is amenable to being transferred voluntarily to psychiatric facility.     Clinical Impression     1. Acute psychosis (HCC/RAF)    2. Homicidal ideation    3. Suicidal ideation          Prescriptions     New Prescriptions    No medications on file       Disposition and Follow-up   Disposition: Transfer to Another Facility [2]    No future appointments.    Follow up with:  No follow-up provider specified.    Return precautions are specified on After Visit Summary.    Scribe Signature   I, [employer] Agricultural consultant, have acted as a Stage manager for patient William Soto on behalf of Gwenith Daily., MD at 10/08/2022 at 7:12 PM. All documentation underwent a comprehensive review by the listed physician(s) and received their approval upon signing.       All scribe entries and documentation made by the scribe were entered at my direction.  I have reviewed this medical record and agree to the accuracy and completeness of the content entered by the scribe.  The documentation recorded by the scribe accurately reflects the service I personally performed and the decisions made by me.           Virl Cagey D., MD  10/08/22 2125

## 2022-10-09 NOTE — ED Notes
Per Dr. Reginal Lutes medically cleared. Initial intake assessment done over the phone with Karoline Caldwell, [facility].

## 2022-10-09 NOTE — Consults
CLINICAL SOCIAL WORKER BRIEF ASSESSMENT      Admit Date:    Date of CSW Assessment: 10/08/2022    Problems: Active Problems:    * No active hospital problems. *       Past Medical History:   Diagnosis Date    Anxiety     Depression     No past surgical history on file.       Primary Care Physician:Pcp, No, MD  Phone:None       ASSESSMENT:     Date Seen: 10/08/22    Date Referred: 10/08/22    Referral Source: MD, RN, Patient    Reason for Referral: Assessment of Support System, [employer], Environmental consultant, Goals of Care, History of Anxiety, History of Depression, History of Mental Health, Homelessness, History of Substance Abuse, Housing Resources, Administrator, sports, Limited Support System, Medication Access, Patient Education, Patient/Family Emotional Distress, Positive C-SSRS, Trauma Assessment    Source of Information: Consultation, Review of medical record, Persons interviewed       Consult Source: MD, RN, Patient                    Does the patient have a Family/Support System member participating in Discharge Planning? No    Family/Support System in agreement with the current discharge plan? No, they are not participating    lnterventions: Discussed role of CSW and informed CSW availability for support and resources, Gathered history, Provided education regarding, Processed and explored feelings related to diagnosis/treatment, Provided empathetic listening and emotional support, Explored and discussed medical non-adherence issues, Provided crisis intervention regarding, Discussed health/safety issues, Provided concrete resource assistance, Assessed financial / insurance issues, Assessed high risk issues, Assessed for barriers to discharge, Discussed resources available    Outcome:  Patient reports willingness for inpatient psychiatric admission.    Plan/Recommendations: Discharge to facility.            Projected Date of Discharge: 10/08/2022      CLINICAL ASSESSMENT:         SAFE-T Protocol with C-SSRS    Step 1: Identify Risk Factors                                                   Ask questions 1 and 2. If no to 2, proceed directly to question 6. If the answer to question 2 is yes, ask questions 3, 4, 5 and 6. If the answer to question 1 and/or 2 is ?yes?, complete ?Intensity of Ideation? section below.   C-SSRS Suicidal Ideation Severity Last Filed Nursing Screening   (Within the last Month) Social Work Assessment  (Within the last Month)   Wish to be dead  Have you wished you were dead or wished you could go to sleep and not wake up?  Yes Yes   Current suicidal thoughts  Have you had any actual thoughts of killing yourself?   Yes Yes   If YES to 2, ask questions 3, 4, 5, and 6. If NO to 2, go directly to question 6.   Suicidal thoughts with Method (with no specific Plan or Intent or Act)  Have you been thinking about how you might do this?   Yes Yes   Suicidal Intent without Specific Plan  Have you had these thoughts and had some intention of acting on them?  No Yes   Intent with Plan  Have you started to work out or worked out the details of how to kill yourself? Do you intend to carry out this plan?   Yes Yes   6) C-SSRS Suicidal Behavior: ?Have you ever done anything, started to do anything, or prepared to do anything to end your life??  (Examples: Collected pills, obtained a gun, gave away valuables, wrote a will or suicide note, took out pills but didn?t swallow any, held a gun but changed your mind or it was grabbed from your hand, went to the roof but didn?t jump; or actually took pills, tried to shoot yourself, cut yourself, tried to hang yourself, etc.)    If ?YES: Was it within the past 3 months?   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   []  Alcohol/substance abuse disorders   [x]  PTSD  []  ADHD  []  TBI  []  Cluster B Personality disorders or traits (i.e.,        Borderline, Antisocial, Histrionic & Narcissistic)   []  Conduct problems (antisocial behavior, aggression,        impulsivity)  []  Recent onset    Presenting Symptoms:   [x]  Anhedonia   [x]   Impulsivity   [x]   Hopelessness or despair   [x]   Anxiety and/or panic   [x]   Insomnia   [x]   Command hallucinations   [x]   Psychosis  Family History:   []   Suicide   []   Suicidal behavior  []   Axis I psychiatric diagnoses requiring hospitalization    Precipitants/Stressors:  [x]   Triggering events leading to humiliation, shame, and/or despair (e.g.            Loss of relationship, financial or health status) (real or anticipated)  []   Chronic physical pain or other acute medical problem (e.g. CNS         disorders)   []   Sexual/physical abuse   [x]   Substance intoxication or withdrawal   [x]   Pending incarceration or homelessness   []   Legal problems   [x]   Inadequate social supports  [x]   Social isolation  []   Perceived burden on others     Change in treatment:  [x]   Recent inpatient discharge  []   Change in provider or treatment (i.e., medications, psychotherapy,               milieu)  []   Hopeless or dissatisfied with provider or treatment   [x]   Non-compliant or not receiving treatment    []  Access to lethal methods: Ask specifically about presence or absence of a firearm in the home or ease of accessing   Step 2: Identify Protective Factors (Protective factors may not counteract significant acute suicide risk factors)   Internal:   []   Ability to cope with stress  []   Frustration tolerance  []   Religious beliefs   []   Fear of death or the actual act of killing self  [x]   Identifies reasons for living   External:   [x]   Cultural, spiritual and/or moral attitudes against suicide  []   Responsibility to children  []   Beloved pets  []   Supportive social network of family or friends  []   Positive therapeutic relationships  []   Engaged in work or school  Step 3: Specific questioning about Thoughts, Plans, and Suicidal Intent - (see Step 1 for Ideation Severity and Behavior)  If semi-structured interview is preferred to complete this section, clinicians may opt to complete C-SSRS Lifetime/Recent for comprehensive behavior/lethality assessment.    C-SSRS Suicidal Ideation Intensity (with respect to the most severe ideation 1-5 identified above) (Within the last Month)   Frequency  How many times have you had these thoughts?    (1) Less than once a week    (2) Once a week   (3)  2-5 times in week    (4) Daily or almost daily    (5) Many times each day   4   Duration  When you have the thoughts how long do they last?     (1) Fleeting - few seconds or minutes                                                    (4) 4-8 hours/most of day   (2) Less than 1 hour/some of the time                                                  (5) More than 8 hours/persistent or continuous   (3) 1-4 hours/a lot of time       3   Controllability  Could/can you stop thinking about killing yourself or wanting to die if you want to?     (1) Easily able to control thoughts                                                         (4) Can control thoughts  with a lot of difficulty   (2) Can control thoughts with little difficulty                                       (5) Unable to control thoughts   (3) Can control thoughts with some difficulty                                     (0) Does not attempt to control thoughts      3   Deterrents  Are there things - anyone or anything (e.g., family, religion, pain of death) - that stopped you from wanting to die or acting on thoughts of suicide?                                      (1) Deterrents definitely stopped you from attempting suicide   (4) Deterrents most likely did not stop you    (2) Deterrents probably stopped you   (5) Deterrents definitely did not stop you    (3) Uncertain that deterrents stopped you  (0) Does not apply  2   Reasons for Ideation  What sort of reasons did you have for thinking about wanting to die or killing yourself?  Was it to end the pain or stop the way you were feeling (in other words you couldn?t go on living with this pain or how you were feeling) or was it to get attention, revenge or a reaction from others? Or both?     (1) Completely to get attention, revenge or a reaction from others (4) Mostly to end or stop the pain (you couldn?t go on living with the pain or how you were feeling)   (2) Mostly to get attention, revenge or a reaction from others (5) Completely to end or stop the pain (you couldn?t go on living with the pain or how you were feeling)   (3) Equally to get attention, revenge or a reaction from others and to end/stop the pain                                                                                                    (0)  Does not apply      4   Total Score                    16     Step 4: Guidelines to Determine Level of Risk and Develop Interventions to LOWER Risk Level  ?The estimation of suicide risk, at the culmination of the suicide assessment, is the quintessential clinical judgment, since no study has identified one specific risk factor or set of risk factors as specifically predictive of suicide or other suicidal behavior.?   From The American Psychiatric Association Practice Guidelines for the Assessment and Treatment of Patients with Suicidal Behaviors, page 24.   RISK STRATIFICATION TRIAGE   High Suicide Risk  [x]  Suicidal ideation with intent or intent with plan in past month (C-SSRS Suicidal Ideation #4 or #5)    Or    [x]  Suicidal behavior within past 3 months (C-SSRS Suicidal Behavior)   [x]   Initiate local psychiatric admission process  [x]   Stay with patient until transfer to higher level of care is complete  [x]   Follow-up and document outcome of emergency psychiatric evaluation   Moderate Suicide Risk  []  Suicidal ideation with method, WITHOUT plan, intent or behavior       in past month (C-SSRS Suicidal Ideation #3)  Or  []  Suicidal behavior more than 3 months ago (C-SSRS Suicidal Behavior Lifetime)  Or  []  Multiple risk factors and few protective factors []   Directly address suicide risk, implementing suicide prevention strategies  []   Develop Safety Plan     Low Suicide Risk  []  Wish to die or Suicidal Ideation WITHOUT method, intent, plan or behavior (C-SSRS Suicidal Ideation #1 or #2)   Or  []  Modifiable risk factors and strong protective factors  Or  []  No reported history of Suicidal Ideation or Behavior []   Discretionary Outpatient Referral       Step 5: Documentation   Risk Level :  [x]   High Suicide Risk  []   Moderate Suicide Risk  []   Low Suicide Risk   Clinical Note:    Your Clinical Observation  Patient appears depressed and sad, affect flat, reports intrusive command AH to kill self.       Relevant Mental Status Information   Patient presents as disheveled, ADLs poor, unshaven. Patient dressed appropriately for weather. Patient presents as jittery and makes frequent and rapid jerky movements of arms and hands. Patient is alert and oriented in all spheres, mood appears anxious and depressed, affect is mood congruent. Patient reports active SI with plan to jump off a bridge, denies HI, denies VH, reports command AH to harm self. Patient reports poor sleep, good appetite, insight is fair, judgement is poor. Eye contact is intermittent.       Methods of Suicide Risk Evaluation  MD evaluation and SAFE-T with C-SSRS.      Brief Evaluation Summary  Patient is a 46 y.o male with a history of schizophrenia who presented to the [facility] ED for increased paranoia and SI plan to jump off a dock. Patient states he was recently released from an inpatient psychiatric admission at [facility] in November 2023 for a similar presentation. Patient states he was not provided with any medications at discharge, was not given any appointments for therapy or medication prescription.   Patient states for the past several weeks he has been feeling increasingly depressed and anxious. Patient states he hears voices telling him to kill himself by jumping off a bridge or a dock. Patient reports he hears voices at baseline but the voices at present are more insistent and patient feels he would act on the voices if released form the hospital. Patient also reports an increase in his paranoia and feels that people are following him. Patient reports vague HI towards those he believes are following him but does not know who those individuals are.    Patient reports a history of 10 prior inpatient psychiatric admissions with the most recent being November 2023. Patient also reports a history of one prior suicide attempt by overdose on blood pressure medication 3 years ago. Patient states he was first diagnosed with paranoid schizophrenia when he was 46 y.o. Patient reports he has been on Geodon and Haldol with good effect but does not know the doses. Patient states the last time he took his medications was 2 weeks ago. Patient denies any history of active substance abuse, denies any alcohol abuse or dependence. 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  Patient reports a social history of being born and raised in [place], came out to [place] by bus a month ago. Patient states he has family [place] of [place] in [place]. 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 Patient reports he is currently homeless and has been living on the streets, not going to shelters. Patient has some financial resources, receives $1000 per month in SSI, denies food stamps. Patient denies any legal issues, denies any acute medical concerns, denies any allergies.       Warning Signs  Increased depression and anxiety  Hopeless and helpless  Difficulty sleeping      Risk Indicators  Command AH  SI with plan and intent to jump off bridge  Depression and anxiety      Protective Factors  Motivated for treatment   Willing to access care  Requesting inpatient admission      Access to  Lethal Means  Patient denies access to weapons or firearms.      Collateral Sources Used and Relevant Information Obtained  Medical record, patient interview      Specific Assessment Data to Support Risk Determination  High risk based on current information, SI with plan and intent.  History of recent inpatient admission and history of SI attempt in the past      Rationale for Actions Taken and Not Taken  Patient willing for a voluntary referral to inpatient psychiatric unit for safety and stabilization.      Provision of Crisis Line 1-800-273-TALK(8255)  Provision of Crisis Text Line (Text HOME to [phone])    Implementation of Safety Plan (If Applicable)  Patient willing to be transferred to inpatient psychiatric facility.              Clinician Name:  Conni Elliot, LCSW       Conni Elliot, LCSW,  10/08/2022

## 2022-10-09 NOTE — ED Notes
Per SW patient is on a voluntary hold. MD canceled medical hold order at 2124. Patient currently awaiting transport to [facility] for admission

## 2022-10-09 NOTE — ED Notes
Patient being transported to [facility] via BLS transport with Medic-1, Unit #[id number] report given to EMT Wallace Cullens

## 2022-10-09 NOTE — ED Notes
Report given to Jenn, RN

## 2022-10-09 NOTE — Progress Notes
10/08/2022 20:30  CSW contacted [facility], spoke to McDonald, faxed patient information for review.    10/08/2022 21:05  Angie from [facility] stating patient accepted for admission. Nurse to nurse completed. Patient will be transferred.    [facility]  [address] [place], [place] [address]  Accepting MD: Dr. Yetta Barre  Unit: Adult Unit 1  Phone: [phone]    10/08/2022 21:45  Transportation contacted, first available transport on 10/09/2022 at 02:00.    10/08/2022 01:03   EMS arrived to transport patient. Patient left ED.

## 2022-10-09 NOTE — ED Notes
Pt calm and cooperative respirations present and unlabored no sob noted at this time. Pt placed into a green gown and belongings kept in locker 2 gun locker 2 and ambulance bay. Active Si jump off a doc no HI psychiatric technician monitoring at bedside

## 2022-10-14 ENCOUNTER — Inpatient Hospital Stay
Admit: 2022-10-14 | Discharge: 2022-10-14 | Disposition: A | Discharge status: Home / Self Care | Payer: PRIVATE HEALTH INSURANCE | Source: Home / Self Care | Attending: Student in an Organized Health Care Education/Training Program

## 2022-10-14 MED ORDER — QUETIAPINE FUMARATE 100 MG PO TABS
150 mg | ORAL_TABLET | Freq: Two times a day (BID) | ORAL | 0 refills | Status: AC
Start: 2022-10-14 — End: ?

## 2022-10-14 NOTE — Progress Notes
SW met pt as a case find due to homelessness and need for med refill. Per triage note, pt requesting seroquel refill. As such, SW met pt and introduced self/role. Pt confirmed that he was at a psych hospital last week and got discharged without discharge medication. Pt is requesting seroquel 168m at this time. MD notified.     Pt denied and did not endorse any other needs. Pt is wearing weather appropriate attire clothing. Also appears to have a duffle bag with him. Warm meal given to pt. SW provided pt homeless resources and info on [facility]. Pt did not sign homeless dc checklist.

## 2022-10-14 NOTE — ED Notes
[employer]?NOTIFICATION?10/14/2022 09:22?Arrowood, Dasean?MRN: [id number]    [facility]'s patient encounter information:   XBJ:[id number]  Account [id number]  Billing Account [id number]      Criteria Met      2 Visits in 30 Days    Security and Safety  No Security Events were found.  ED Care Guidelines  There are currently no ED Care Guidelines for this patient. Please check your facility's medical records system.          Prescription Drug Data  No Prescription Drug Data was found.    E.D. Visit Count (12 mo.)  Facility Visits   [facility] 3   Total 3   Note: Visits indicate total known visits.     Recent Emergency Department Visit Summary  Date Facility [facility] Type Diagnoses or Chief Complaint    Oct 14, 2022  [facility].  [place]  Emergency     Oct 08, 2022  [facility].  [place]      1. Brief psychotic disorder      1. Suicidal      2. Homicidal ideations      3. Suicidal ideations      Aug 28, 2022  [facility].  [place]  Emer[place]      1. Suicidal ideations      1. Psychiatric Evaluation      2. Paranoid schizophrenia        Recent Inpatient Visit Summary  No Recent Inpatient Visits were found.  Care Team  No Care Team was found.  [facility]  This patient has registered at the [facility] Emergency Department  For more information visit: [email or web address]   PLEASE NOTE:     1.   Any care recommendations and other clinical information are provided as guidelines or for historical purposes only, and providers should exercise their own clinical judgment when providing care.    2.   You may only use this information for purposes of treatment, payment or health care operations activities, and subject to the limitations of applicable [employer] Policies.    3.   You should consult directly with the organization that provided a care guideline or other clinical history with any questions about additional information or accuracy or completeness of information provided.    ? 2023 [employer] - [email or web address]

## 2022-10-14 NOTE — ED Provider Notes
[facility]  Emergency Department Service Report    William Soto 46 y.o. male , presents with Medication Refill      Triage   Arrived on 10/14/2022 at 9:22 AM   Arrived by Walk-in [14]    ED Triage Vitals   Temp Temp Source BP Heart Rate Resp SpO2 O2 Device Pain Score Weight   10/14/22 0926 10/14/22 0926 10/14/22 0926 10/14/22 0926 10/14/22 0926 10/14/22 0926 -- 10/14/22 0937 10/14/22 0937   36.7 ?C (98.1 ?F) Oral 118/84 (!) 127 16 96 %  Zero 65.8 kg (145 lb)       Pre hospital care:       No Known Allergies    History   HPI     Patient is a 46 y.o. male, currently unhoused, who presents to the ED for medication refill. Patient states he recently left a psychiatric hospital. He is requesting a seroquel refill. He denies any other medical complaints. He states he has a PCP that he can follow up with. History provided by the patient.    Chiropractor Used: No                            Past Medical History:   Diagnosis Date    Anxiety     Depression         History reviewed. No pertinent surgical history.     Past Family History   Family history reviewed by me and there is no pertinent past family history related to the patient's current case and/or care.             Past Social History   he reports that he has been smoking cigarettes. He has never used smokeless tobacco. He reports current alcohol use. No history on file for drug use and sexual activity.       Physical Exam   Physical Exam  Vitals and nursing note reviewed.   Constitutional:       General: He is not in acute distress.  HENT:      Head: Atraumatic.      Mouth/Throat:      Mouth: Mucous membranes are moist.   Eyes:      Conjunctiva/sclera: Conjunctivae normal.   Cardiovascular:      Comments: Mild tachycardia  Pulmonary:      Effort: Pulmonary effort is normal.   Abdominal:      General: There is no distension.   Musculoskeletal:         General: Normal range of motion.   Skin:     Findings: No rash.   Neurological:      General: No focal deficit present.      Mental Status: He is alert. He is not disoriented.      Motor: Motor function is intact.   Psychiatric:         Behavior: Behavior normal.         ED Course          Laboratory Results   Labs Reviewed - No data to display    Imaging Results     No orders to display       Administered Medications     Medication Administration from 10/14/2022 0922 to 10/17/2022 2033       None            Procedures   Procedural Sedation  Procedures  Medical Decision Making   William Soto is a 46 y.o. male, currently unhoused, who presents to the ED for medication refill. On arrival to the emergency department, patient is in no distress.  Initial triage vitals are within normal limits.  Patient has no medical complaints only requests a medication refill for Seroquel.  Has a follow up appointment with a psychiatrist.  He denies any suicidal homicidal ideation.  Patient was given a refill for Seroquel, advised any skipped beat refills from his psychiatrist.  Patient expressed understanding, stated that he missed his appointment by accident but will be sure to make his next appointment.  Patient discharged in stable condition.    Medical Decision Making  Risk  Prescription drug management.      Clinical Impression     1. Encounter for medication refill          Prescriptions     Discharge Medication List as of 10/14/2022  9:57 AM        START taking these medications    Details   QUEtiapine 100 mg tablet Take 1.5 tablets (150 mg total) by mouth two (2) times daily., Starting Sun 10/14/2022, Normal             Disposition and Follow-up   Disposition: Discharge [1]    No future appointments.    Follow up with:  Pcp, No, MD    In 1 week      [facility] Emergency Department  [address]  [place] [address]  [phone]    If symptoms worsen      Return precautions are specified on After Visit Summary.    Scribe Signature   I, Marijean Bravo, have acted as a Stage manager for patient William Soto on behalf of Varney Baas., MD at 10/14/2022 at 9:50 AM. All documentation underwent a comprehensive review by the listed physician(s) and received their approval upon signing.     I have reviewed this note as recorded by scribe who is named above. He/She  acted as medical scribe,and I attest that it is an accurate representation of my H&P and other events of the ED visit except as otherwise noted.           Varney Baas., MD  10/17/22 2033

## 2022-10-14 NOTE — Discharge Instructions
You were seen in the emergency department for medication refill.  We gave you 150 mg of Seroquel which he reports is your home dose.  Take this medication twice a day.  We gave you a month's worth but you will need to follow up with the primary care provider or psychiatrist for further refills, we can not do more refills from the emergency department.  Follow up this ED visit with your psychiatrist within the next 1-2 days and return to the emergency department for any new concerning symptoms.

## 2022-10-17 DIAGNOSIS — Z760 Encounter for issue of repeat prescription: Secondary | ICD-10-CM

## 2022-10-31 ENCOUNTER — Inpatient Hospital Stay
Admit: 2022-10-31 | Discharge: 2022-11-01 | Disposition: A | Discharge status: Other Acute Inpatient Hospital | Payer: MEDICAID | Source: Home / Self Care

## 2022-10-31 DIAGNOSIS — R45851 Suicidal ideations: Secondary | ICD-10-CM

## 2022-10-31 DIAGNOSIS — F1910 Other psychoactive substance abuse, uncomplicated: Secondary | ICD-10-CM

## 2022-10-31 LAB — Expedited COVID-19 and Influenza A B PCR: INFLUENZA B PCR: NOT DETECTED

## 2022-10-31 LAB — Basic Metabolic Panel: CREATININE: 0.61 mg/dL (ref 0.60–1.30)

## 2022-10-31 LAB — CBC: NUCLEATED RBC%, AUTOMATED: 0 (ref 13.5–17.1)

## 2022-10-31 LAB — Differential Automated: BASOPHIL PERCENT, AUTO: 0.5 (ref 1.30–3.40)

## 2022-10-31 LAB — UA,Dipstick,POC: BLOOD UR,POC: NEGATIVE (ref 5.0–8.0)

## 2022-10-31 LAB — Drug Screen,serum: ALCOHOL,ETHYL,SERUM: <15 mg/dL (ref <=30.0)

## 2022-10-31 MED ADMIN — OLANZAPINE 5 MG PO TBDP: 10 mg | ORAL | Stop: 2022-10-31 | NDC 64380017202

## 2022-10-31 NOTE — ED Notes
[facility]?NOTIFICATION?10/31/2022 12:44?Warwick, Breken?MRN: [id number]    [facility] Monica's patient encounter information:   XLK:?[id number]  Account [id number]  Billing Account [id number]      Criteria Met      2 Visits in 30 Days    Security and Safety  No Security Events were found.  ED Care Guidelines  There are currently no ED Care Guidelines for this patient. Please check your facility's medical records system.          Prescription Drug Data  No Prescription Drug Data was found.    E.D. Visit Count (12 mo.)  Facility Visits   [facility] 4   Total 4   Note: Visits indicate total known visits.     Recent Emergency Department Visit Summary  Date Facility [facility] Type Diagnoses or Chief Complaint    Oct 31, 2022  [facility].  [place]gency     Oct 14, 2022  [facility].  [place]      1. Encounter for issue of repeat prescription      1. Medication Refill      Oct 08, 2022  [facility].  [place]      1. Brief psychotic disorder      1. Suicidal      2. Homicidal ideations      3. Suicidal ideations      Aug 28, 2022  [facility].  [place]      1. Suicidal ideations      1. Psychiatric Evaluation      2. Paranoid schizophrenia        Recent Inpatient Visit Summary  No Recent Inpatient Visits were found.  Care Team  No Care Team was found.  [facility]  This patient has registered at the [facility] Emergency Department  For more information visit: [email or web address]   PLEASE NOTE:     1.   Any care recommendations and other clinical information are provided as guidelines or for historical purposes only, and providers should exercise their own clinical judgment when providing care.    2.   You may only use this information for purposes of treatment, payment or health care operations activities, and subject to the limitations of applicable [facility] Policies.    3.   You should consult directly with the organization that provided a care guideline or other clinical history with any questions about additional information or accuracy or completeness of information provided.    ? 2024 [facility] - [email or web address]

## 2022-10-31 NOTE — Other
State of [place] - Health and Investment banker, corporate of Health Care Services   INVOLUNTARY PATIENT ADVISEMENT  (TO BE READ AND GIVEN TO THE  PATIENT AT TIME OF ADMISSION) Confidential Patient Information  See W&I Code Section 5328 and   HIPAA Priacy Rule 45 C.F.R. Section 164.508   Name of Facility     [facility]   Patient?s Name     Abdoul Encinas Admission Date     10/31/2022   Section 5150(h) of the Welfare and Institutions Code requires that each person admitted to a facility designated by the county for evaluation and treatment be given specific information orally and in writing, and in a language or modality accessible to the person and a record of the advisement be kept in the person?s medical record.   My name is Anika Shore My position here is  LCSW   You are being placed in this psychiatric facility because it is our professional opinion, that as a result of a mental health disorder, you are likely to: (check applicable)   [X]  Harm yourself [  ] Harm someone else [  ] Be unable to be take care of your own food clothing or shelter   (List specific facts upon which the allegation of dangerous or gravely disabled due to mental health disorder is based, including pertinent facts arising from the admission interview):   We believe this is true because  You are reporting hearing voices telling you to kill yourself, and you have a plan to jump off of a bridge. You are unable to contract for your safety.    You will be held for a period of up to 72 hours. This ( []  does not ) ( [x]  does) include weekends or holidays.   Your 72-hour period begins: 1:10PM on 10/31/2022    (Time and Date)   Your 72-hour evaluation and treatment period will end at: 1:10PM on 11/03/2022    (Time and Date)   You will be held for a period up to 72 hours. During the 72 hours you may also be transferred to another facility. You may request to be evaluated or treated at a facility of your choice. You may request to be evaluated or treated by a mental health professional of your choice. We cannot guarantee the facility or mental health professional you choose will be available, but we will honor your choice if we can.     During these 72 hours you will be evaluated by the facility staff, and you may be given treatment, including medications. It is possible for you to be released before the end of the 72 hours. But if the staff decides that you need continued treatment you can be held for a longer period of time. If you are held longer than 72 hours, you have the right to a lawyer and a qualified interpreter and a hearing before a judge. If you are unable to pay for the lawyer, then one will be provided to you free of charge.     If you have questions about your legal rights, you may contact the county Patients? Rights Advocate at [phone] or [phone] (phone number of county Patients? Rights Metallurgist).   Good cause for Incomplete Advisement N/A Date N/A   Advisement Completed by  (Electronically signed by)  Dorthea Cove Position  LCSW Language or Modality Used  Spoken English Date  10/31/2022   CC: Original to the Patient  DHCS 1802 (10/2012) [place] to the Patient?s Record

## 2022-10-31 NOTE — Consults
SAFE-T Protocol with C-SSRS    Step 1: Identify Risk Factors                                                   Ask questions 1 and 2. If no to 2, proceed directly to question 6. If the answer to question 2 is yes, ask questions 3, 4, 5 and 6. If the answer to question 1 and/or 2 is ?yes?, complete ?Intensity of Ideation? section below.   C-SSRS Suicidal Ideation Severity Last Filed Nursing Screening   (Within the last Month) Social Work Assessment  (Within the last Month)   Wish to be dead  Have you wished you were dead or wished you could go to sleep and not wake up?  Yes Yes   Current suicidal thoughts  Have you had any actual thoughts of killing yourself?   Yes Yes   If YES to 2, ask questions 3, 4, 5, and 6. If NO to 2, go directly to question 6.   Suicidal thoughts with Method (with no specific Plan or Intent or Act)  Have you been thinking about how you might do this?   Yes Yes   Suicidal Intent without Specific Plan  Have you had these thoughts and had some intention of acting on them?   Yes Yes   Intent with Plan  Have you started to work out or worked out the details of how to kill yourself? Do you intend to carry out this plan?   Yes Yes   6) C-SSRS Suicidal Behavior: ?Have you ever done anything, started to do anything, or prepared to do anything to end your life??  (Examples: Collected pills, obtained a gun, gave away valuables, wrote a will or suicide note, took out pills but didn?t swallow any, held a gun but changed your mind or it was grabbed from your hand, went to the roof but didn?t jump; or actually took pills, tried to shoot yourself, cut yourself, tried to hang yourself, etc.)    If ?YES: Was it within the past 3 months?   (Within Lifetime) (Within Lifetime)     No Yes    (Within the past 3 Months) (Within the past 3 Months)     No Yes   Current and Past Psychiatric Dx:   [x]  Mood Disorder  [x]  Psychotic disorder   []  Alcohol/substance abuse disorders   []  PTSD  []  ADHD  []  TBI  []  Cluster B Personality disorders or traits (i.e.,        Borderline, Antisocial, Histrionic & Narcissistic)   [x]  Conduct problems (antisocial behavior, aggression,        impulsivity)  []  Recent onset    Presenting Symptoms:   []  Anhedonia   [x]   Impulsivity   []   Hopelessness or despair   [x]   Anxiety and/or panic   []   Insomnia   [x]   Command hallucinations   [x]   Psychosis  Family History:   [x]   Suicide   [x]   Suicidal behavior  []   Axis I psychiatric diagnoses requiring hospitalization    Precipitants/Stressors:  []   Triggering events leading to humiliation, shame, and/or despair (e.g.            Loss of relationship, financial or health status) (real or anticipated)  []   Chronic physical pain or other acute medical  problem (e.g. CNS         disorders)   []   Sexual/physical abuse   [x]   Substance intoxication or withdrawal   [x]   Pending incarceration or homelessness   []   Legal problems   [x]   Inadequate social supports  [x]   Social isolation  []   Perceived burden on others     Change in treatment:  [x]   Recent inpatient discharge  []   Change in provider or treatment (i.e., medications, psychotherapy,               milieu)  []   Hopeless or dissatisfied with provider or treatment   [x]   Non-compliant or not receiving treatment    []  Access to lethal methods: Ask specifically about presence or absence of a firearm in the home or ease of accessing   Step 2: Identify Protective Factors (Protective factors may not counteract significant acute suicide risk factors)   Internal:   []   Ability to cope with stress  []   Frustration tolerance  []   Religious beliefs   []   Fear of death or the actual act of killing self  []   Identifies reasons for living   External:   []   Cultural, spiritual and/or moral attitudes against suicide  []   Responsibility to children  []   Beloved pets  []   Supportive social network of family or friends  []   Positive therapeutic relationships  []   Engaged in work or school     Step 3: Specific questioning about Thoughts, Plans, and Suicidal Intent - (see Step 1 for Ideation Severity and Behavior)  If semi-structured interview is preferred to complete this section, clinicians may opt to complete C-SSRS Lifetime/Recent for comprehensive behavior/lethality assessment.    C-SSRS Suicidal Ideation Intensity (with respect to the most severe ideation 1-5 identified above) (Within the last Month)   Frequency  How many times have you had these thoughts?    (1) Less than once a week    (2) Once a week   (3)  2-5 times in week    (4) Daily or almost daily    (5) Many times each day   Unable to assess. Patient psychotic   Duration  When you have the thoughts how long do they last?     (1) Fleeting - few seconds or minutes                                                    (4) 4-8 hours/most of day   (2) Less than 1 hour/some of the time                                                  (5) More than 8 hours/persistent or continuous   (3) 1-4 hours/a lot of time       Unable to assess. Patient psychotic   Controllability  Could/can you stop thinking about killing yourself or wanting to die if you want to?     (1) Easily able to control thoughts                                                         (  4) Can control thoughts  with a lot of difficulty   (2) Can control thoughts with little difficulty                                       (5) Unable to control thoughts   (3) Can control thoughts with some difficulty                                     (0) Does not attempt to control thoughts      Unable to assess. Patient psychotic   Deterrents  Are there things - anyone or anything (e.g., family, religion, pain of death) - that stopped you from wanting to die or acting on thoughts of suicide?                                      (1) Deterrents definitely stopped you from attempting suicide   (4) Deterrents most likely did not stop you    (2) Deterrents probably stopped you   (5) Deterrents definitely did not stop you (3) Uncertain that deterrents stopped you  (0) Does not apply      Unable to assess. Patient psychotic   Reasons for Ideation  What sort of reasons did you have for thinking about wanting to die or killing yourself?  Was it to end the pain or stop the way you were feeling (in other words you couldn?t go on living with this pain or how you were feeling) or was it to get attention, revenge or a reaction from others? Or both?     (1) Completely to get attention, revenge or a reaction from others (4) Mostly to end or stop the pain (you couldn?t go on living with the pain or how you were feeling)   (2) Mostly to get attention, revenge or a reaction from others (5) Completely to end or stop the pain (you couldn?t go on living with the pain or how you were feeling)   (3) Equally to get attention, revenge or a reaction from others and to end/stop the pain                                                                                                    (0)  Does not apply          Unable to assess. Patient psychotic   Total Score                    NA      Step 4: Guidelines to Determine Level of Risk and Develop Interventions to LOWER Risk Level  ?The estimation of suicide risk, at the culmination of the suicide assessment, is the quintessential clinical judgment, since no study has identified one specific risk factor or set of risk factors as specifically predictive of  suicide or other suicidal behavior.?   From The American Psychiatric Association Practice Guidelines for the Assessment and Treatment of Patients with Suicidal Behaviors, page 24.   RISK STRATIFICATION TRIAGE   High Suicide Risk  [x]  Suicidal ideation with intent or intent with plan in past month (C-SSRS Suicidal Ideation #4 or #5)    Or    [x]  Suicidal behavior within past 3 months (C-SSRS Suicidal Behavior)   [x]   Initiate local psychiatric admission process  [x]   Stay with patient until transfer to higher level of care is complete  []   Follow-up and document outcome of emergency psychiatric evaluation   Moderate Suicide Risk  []  Suicidal ideation with method, WITHOUT plan, intent or behavior       in past month (C-SSRS Suicidal Ideation #3)  Or  []  Suicidal behavior more than 3 months ago (C-SSRS Suicidal Behavior Lifetime)  Or  []  Multiple risk factors and few protective factors []   Directly address suicide risk, implementing suicide prevention strategies  []   Develop Safety Plan     Low Suicide Risk  []  Wish to die or Suicidal Ideation WITHOUT method, intent, plan or behavior (C-SSRS Suicidal Ideation #1 or #2)   Or  []  Modifiable risk factors and strong protective factors  Or  []  No reported history of Suicidal Ideation or Behavior []   Discretionary Outpatient Referral       Step 5: Documentation   Risk Level :  [x]   High Suicide Risk  []   Moderate Suicide Risk  []   Low Suicide Risk   Clinical Note:    Your Clinical Observation  Patient is a 47 year old homeless male with current suicidal ideations. Patient appears to be talking to himself and seems to be responding to internal stimuli. Patient states he is actively hearing commands to kill himself. States she does not feel safe and wants to jump off a bridge. Patient appears disheveled as well. SW observed pt attempt to choke himself at bedside when becoming agitated by SW assessment. Also made threats to bang his head on the wall if he does not get the help he needs. MD notified regarding pt's agitation and attempt to choke himself.       Relevant Mental Status Information   Patient appears to be presenting with psychosis, agitation, and impulsive behavior. Patient exhibits irritability when attempting to assess him. Per chart review, pt has a history of chronic meth use and poor follow-up with outpatient mental health services.  Pt exhibits fair to poor judgment at this time.       Methods of Suicide Risk Evaluation  Clinical observation, 1:1 interview, chart review.       Brief Evaluation Summary  Patient is a 47 year old homeless male with history of depression, anxiety, meth use. Patient reports limited social and family support in [place] and poor follow-up with outpatient mental health services. Today, he reports feeling paranoid and unsafe about being on the street. He reports wanting to kill himself and jump off a bridge, and made multiple threats to kill himself immediately if he were to discharge from the ED. As he made threats, pt attempted to briefly choke himself, but discontinued the attempt immediately when SW validated his concerns and encouraged him to remain calm.    Further, he endorses active auditory hallucinations and also seems to be responding to internal stimuli as evidenced by facial gestures and body movement. He admits to meth use and denies any other drug use at this time. He  reports not receiving any outpatient MH services and states he has been off his medications. He reports that he was not discharged with any medications from [facility].    At this time, he cannot contract for safety and cannot thoroughly engage in SW assessment due to agitation, irritability, and poor redirection. MD was notified of pt's agitation and agreed to place medication order.       Warning Signs  Agitation, impulsivity, active SI.       Risk Indicators  Homelessness, untreated mental illness, chronic meth use, active Ahs and commands       Protective Factors  Limited protective factors, however pt self-presented to the ED seeking help.       Access to Lethal Means  Patient reports wanting to jump off a bridge.       Collateral Sources Used and Relevant Information Obtained  Chart review, consult with MD after MD evaluation       Specific Assessment Data to Support Risk Determination  Chart review, discussion with MD, clinical observation and engagement w/ pt at bedside.       Rationale for Actions Taken and Not Taken  Patient unable to contract for safety. Patient appears impulsive and easily agitated. Patient is deemed high-risk for self-harm and suicide at this time.  Patient meets criteria for a 5150 eval.         Implementation of Safety Plan (If Applicable)  N/A              Clinician Name:  [facility], LCSW

## 2022-10-31 NOTE — Other
[facility] Department of Mental Health - MH 302 NCR                                                 Department of Health Care Services  [place]                                       Health and Hospital doctor   APPLICATION FOR ASSESSMENT, EVALUATION, AND CRISIS INTERVENTION OR PLACEMENT FOR EVALUATION AND TREATMENT  Confidential Client/Patient Information  See [place] W&I Code, Section 5328 and HIPAA Privacy Rule 45 C.F.R. ? 58.508  Welfare and Institutions Code (W&I Code), Section 5150(f) and (g), require that each person, when first detained for psychiatric evaluation, be given certain specific information orally and a record be kept of the advisement by the evaluating facility. DETAINMENT ADVISEMENT  My name is ____[name]____________  I am a (peace officer/mental health professional) with (name of agency).  You are not under criminal arrest, but I am taking you for examination by mental health professionals at (name of facility).     You will be told your rights by the mental health staff.     If taken into custody at his or her residence, the person shall also be told the following information:       [X]  Advisement Complete        [  ] Advisement Incomplete      You may bring a few personal items with you, which I will have to approve. Please inform me if you need assistance turning off any appliance or water. You may make a phone call and leave a note to tell your friends or family where you have been taken.   Good Cause for Incomplete Advisement:                                                                                               Advisement Completed By:  Dorthea Cove        Position:   LCSW        Language or Modality Used:  Spoken English        Date of Advisement:  10/31/2022          To (name of 5150 designated facility): Any designated LPS Nurse, learning disability is hereby made for the assessment and evaluation of William Soto     residing at [place], [place], [place], [address], [place], for up to 72-hour assessment, evaluation, and crisis intervention or placement for evaluation and treatment at a designated facility pursuant to Section 5150, et seq. (adult) or Section 5585 et seq. (minor), of the W&I Code. If a minor, authorization for voluntary treatment is not available and to the best of my knowledge, the legally responsible party appears to be/is: (Check one):  []  Parent;  []  Legal Guardian;  []   Conservator;  []  Juvenile Court under W&I Code 300;  []  Juvenile Court under W&I Code 601/602.      If known, provide names, address and telephone numbers in area provided below:   N/A          The above person?s condition was called to my attention under the following circumstances:    [facility] Emergency Department, consultation to Psychiatry     [ ]  Amended 5150, original scanned in EHR        I have probable cause to believe that the person is, as a result of a mental health disorder, a danger to others or to himself/herself, or gravely disabled because: (state specific facts):   Patient is reporting auditory command hallucinations telling him to kill himself. He is unable to contract for his safety and reports that he plans to ''jump off a bridge''. Patient attempted to choke himself at bedside when speaking with hospital staff.         (CONTINUED ON NEXT PAGE)   Reference: DHCS 1801 (06/18)                                                                                                                     Page 1 of 4  Original Application: Sales executive to Assessment, Evaluation, and Crisis Intervention Location or 5150/5585 Designated Facility   [facility] Department of Mental Health - MH 302 NCR                                                 Department of Health Care Services  [place]                                       Health and Health and safety inspector Agency            CLIENT NAME: William Soto   APPLICATION FOR 72 HOUR DETENTION FOR EVALUATION AND TREATMENT (CONTINUED)     Historical course of the person?s mental disorder:  [X]  I have considered the historical course of the person?s mental disorder: [Includes evidence presented by service/support provider, family member(s), and person subject to probable cause determination or designee.]  Chart Review; information from patient    [  ] No reasonable bearing on determination  [  ] No information available because: N/A       History Provided by (Name) Address Phone Number Relation                  Based upon the above information, there is probable cause to believe that said person is, as a result of mental health disorder:     [X]  A danger to himself/herself.  [  ] A danger to others.        [  ]  Gravely disabled adult.  [  ] Gravely disabled minor.        Minors only: []  Based upon the above information, it appears that there is probable cause to believe that authorization for voluntary treatment is not available.   Signature, title, and badge number of Tax adviser, professional person in charge of the facility designated by the county for evaluation and treatment, member of the attending staff, designated members of a mobile crisis team, or professional person designated by [employer].     X Amaiyah Nordhoff Badge/NPI#: [id number]   (Electronically signed)      Date: 10/31/2022       Phone:  [phone]        Time: 3:55 PM         Name of Law Enforcement Agency or Evaluation Facility/Person:    [facility] Address of [employer] or Evaluation Facility/Person:   [address] [place], [place] [address]   For patients in Medical ER?s, detention began:  Date:  10/31/22  Time:  1:10PM           NOTIFICATIONS TO BE PROVIDED TO LAW ENFORCEMENT AGENCY     Notify (officer/unit & telephone #): N/A        NOTIFICATION OF PERSON?S RELEASE IS REQUESTED BY THE REFERRING PEACE OFFICER BECAUSE:   [  ] The person has been referred to the facility under circumstances which, based upon an allegation of facts regarding actions witnessed by the officer or another person, would support the filing of a criminal complaint.  []  Weapon was Engineer, drilling to Section 8102 W&I Code. Upon release, facility is required to provide notice to the person regarding the procedure to obtain return of any confiscated firearm pursuant to Section 8102 W&I Code.   Reference: [facility] 1801 (06/18)                                                                                                                     Page 2 of 4  Original Application: Accompany Client to Assessment, Evaluation, and Crisis Intervention Location or 5150/5585 Designated Facility   [facility] [employer] of Mental Health - MH 302 NCR                                                 Department of Health Care Services  [place]                                       Health and Health and safety inspector Agency   SEE SUBSEQUENT PAGES FOR DEFINITIONS AND REFERENCES     DEFINITIONS AND REFERENCES   ?Gravely Disabled? means a condition in which a person, as a result of a mental disorder, is  unable to provided for his or her basic personal needs for food, clothing and shelter.  SECTION 5008(h) W&I Code    ?Gravely Disabled Minor? means a minor who, as a result of a mental disorder, is unable to use the elements of life which are essential to health, safety, and development, including food, clothing, and shelter, even though provided to the minor by others.  Intellectual disability, epilepsy, or other developmental disabilities, alcoholism, other drug abuse, or repeated antisocial behavior do not, by themselves, constitute a mental disorder.  SECTION 5585.25 W&I Code    ?Tax adviser? means a duly Music therapist as that term is defined in Chapter 4.5 (commencing with Section 830) of Title 3 of Part 2 of the Penal Code who has completed the basic training course established by the Commission on [employer], or any Civil Service fast streamer or probation officer specified in Section 830.5 of the Penal Code when acting in relation to cases for which he or she has a legally mandated responsibility. SECTION 5008 (i) W&I Code    Section 5152.1 W&I Code: The professional person in charge of the facility providing 72-hour evaluation and treatment, or his or her designee, shall notify the county Secondary school teacher or the director's designee and the Tax adviser who makes the written application pursuant to Section 5150 or a person who is designated by the Patent examiner agency that employs the Tax adviser, when the person has been released after 72-hour detention, when the person is not detained, or when the person is released before the full period of allowable 72-hour detention if all the conditions apply:    (a)  The Tax adviser requests such notification at the time he or she makes the application and the Tax adviser certifies at that time in writing that the person has been referred to the facility under circumstances which, based upon an allegation of facts regarding actions witnessed by the officer or another person, would support the filing of a criminal complaint.   (b) The notice is limited to the person's name, address, date of admission for 72-hour evaluation and treatment, and date of release.        If a Emergency planning/management officer, Sales executive, or designee of the Sales executive, possesses any record of information obtained pursuant to the notification requirements of this section, the officer, agency, or designee shall destroy that record two years after the receipt of notification.     Section 5150.05 W&I Code:    (a)  When determining if probable cause exists to take a person into custody, or cause a person to be taken into custody, pursuant to Section 5150, any person who is authorized to take that person, or cause that person to be taken, into custody pursuant to that section shall consider available relevant information about the historical course of the person's mental disorder if the authorized person determines that the information has a reasonable bearing on the determination as to whether the person is a danger to others, or to himself or herself, or is gravely disabled as a result of the mental disorder.   (b) For purposes of this section, ''information about the historical course of the person's mental disorder'' includes evidence presented by the person who has provided or is providing mental health or related support services to the person subject to a determination described in subdivision (a), evidence presented by one or more members of the family of that person, and evidence presented by the  person subject to a determination described in subdivision (a) or anyone designated by that person.      Reference: [facility] 1801 (06/18)                                                                                                                     Page 3 of 4  Original Application: Accompany Client to Assessment, Evaluation, and Crisis Intervention Location or 5150/5585 Designated Facility   [facility] Department of Mental Health - MH 302 NCR                                                 Department of Health Care Services  [place]                                       Health and Hospital doctor   DEFINITIONS AND REFERENCES (CONTINUED)    (c)  If the probable cause in subdivision (a) is based on the statement of a person other than the one authorized to take the person into custody pursuant to Section 5150, a member of the attending staff, or a professional person, the person making the statement shall be liable in a civil action for intentionally giving any statement that he or she knows to be false.   (d) This section shall not be applied to limit the application of Section 5328.          Section 5152.2 W&I Code: Each Patent examiner agency within a county shall arrange with the county Secondary school teacher a method for giving prompt notification to English as a second language teacher pursuant to Section 5152.1 W&I Code.     Section 5585.50 W&I Code: The facility shall make every effort to notify the minor's parent or legal guardian as soon as possible after the minor is detained. Section 5585.50 W&I Code.    A minor under the jurisdiction of the [employer] under Section 300 W&I Code, is due to abuse, neglect or exploitation.    A minor under the jurisdiction of the Juvenile Court under Section 601 W&I Code is due to being adjudged a ward of the court as a result of being out of parental control.    A minor under the jurisdiction of the Juvenile Court under Section 602 W&I Code is due to being adjudged a ward of the court because of crimes committed.     Section 8102 W&I Code (EXCERPTS FROM):    (a)  Whenever a person who has been detained or apprehended for examination of his or her mental condition or who is a person described in Section 8100 or 8103, is found to own, have in his or her possession or under his or her control, any firearm whatsoever, or any deadly weapon,  the firearm or other deadly weapon shall be confiscated by any IT consultant, who shall retain custody of the firearm or other deadly weapon. ''Deadly weapon,'' as used in this section, has the meaning prescribed by Section 8100.   (b) (1) Upon confiscation of any firearm or other deadly weapon from a person who has been detained or apprehended for examination of his or her mental condition, the Tax adviser or law enforcement agency shall issue a receipt describing the deadly weapon or any firearm and listing any serial number or other identification on the firearm and shall notify the person of the procedure for the return, sale, transfer, or destruction of any firearm or other deadly weapon which has been confiscated. A peace officer or law enforcement agency that provides the receipt and notification described in Section 33800 of the Penal Code satisfies the receipt and notice requirements.  (2) If the person is released, the professional person in charge of the facility, or his or her designee, shall notify the person of the procedure for the return of any firearm or other deadly weapon which may have been confiscated.  (3) Health facility personnel shall notify the confiscating law enforcement agency upon release of the detained person, and shall make a notation to the effect that the facility provided the required notice to the person regarding the procedure to obtain return of any confiscated firearm.        Health and Safety Code [phone] (d)     A person detained under this section in a medical emergency room shall be credited for the time detained, up to twenty-four hours, in the event he or she is placed on a 72-hour hold pursuant to Section 5150 of the Welfare and Institutions Code.     Reference: DHCS 1801 (06/18)                                                                                                                     Page 4 of 4  Original Application: Accompany Client to Assessment, Evaluation, and Crisis Intervention Location or 5150/5585 Designated Facility

## 2022-10-31 NOTE — ED Provider Notes
Chief Complaint:   Psychiatric Evaluation (Pt states: I've been hearing voices, paranoia. I've been out of my medications for a while. I'm suicidal. Planning to jump of a bridge. I don't feel safe in the street. )    History:  William Soto is a 47 y.o. male who presents to the ED with suicide ideation with plans to jump off a bridge, hearing voices, and being off of his medications.    The patient has a history of methamphetamine abuse and prior psychiatric admissions who has been off his medication he reports increasing suicidal ideation without homicidal ideation.  He is having auditory hallucinations without visual hallucinations and he feels like he is unsafe.    Pt has not been maintaining social distance, wearing a mask, or following COVID precautions and is, thus, at higher risk of COVID.    PMH:   Past Medical History:   Diagnosis Date    Anxiety     Depression        PSH: History reviewed. No pertinent surgical history.    FamHx: Noncontributory except for as noted in history of present illness.    Medications:   Previous Medications    QUETIAPINE 100 MG TABLET    Take 1.5 tablets (150 mg total) by mouth two (2) times daily.       Allergies: He has No Known Allergies.    Social History: Marital Status: divorced. He  reports that he has been smoking cigarettes. He has never used smokeless tobacco.. He  reports current alcohol use..    Review of Systems: Pertinent systems reviewed and are negative except as per history of present illness.  In addition,   General: no fevers, chills, sweats  Cardiopulmonary no chest pain or SOB  GI tol po's without acute complaints  Neuro no lethargy or weakness or numbness    Exam:   VITAL SIGNS:   ED Triage Vitals   Temp Temp Source BP Heart Rate Resp SpO2 O2 Device Pain Score Weight   10/31/22 1256 10/31/22 1256 10/31/22 1253 10/31/22 1253 10/31/22 1253 10/31/22 1253 10/31/22 1253 -- --   36.7 ?C (98.1 ?F) Oral 138/86 99 20 99 % None (Room air)        Const: Appropriately interactive   Head:         Atraumatic   Eyes:                 Normal Conjunctiva   ENT:                 Normal External Ears, Nose and Mouth.   Neck:         Full range of motion.  No meningismus.   Resp:         Clear to auscultation bilaterally   Cardio:         Regular rate and rhythm, no murmurs   Abd:                 Soft, non tender, non distended. Normal bowel sounds   Skin:                 No petechia or rashes   Back:         No midline or flank tenderness   Ext:                  No cyanosis, or edema   Neur:  Awake and alert   Psych:         Elevated affect, appears to be responding to internal stimuli, talking to himself, laughing inappropriately at times    Labs:  Labs Reviewed   BASIC METABOLIC PANEL - Abnormal; Notable for the following components:       Result Value    Potassium 3.5 (*)     All other components within normal limits   CBC (PERFORMABLE) - Abnormal; Notable for the following components:    Red Blood Cell Count 4.27 (*)     Hemoglobin 12.9 (*)     All other components within normal limits   DIFFERENTIAL, AUTOMATED (PERFORMABLE) - Abnormal; Notable for the following components:    Absolute Mono Count 1.22 (*)     All other components within normal limits   SEDATIVE-HYPNOTIC DRUG SCREEN, SERUM - Abnormal; Notable for the following components:    Acetaminophen,serum <10 (*)     All other components within normal limits    Narrative:     Unconfirmed screening results should be used only for medical (i.e.,treatment) purposes and not for non-medical purposes (e.g.,employment testing, legal testing).   EXPEDITED COVID-19 AND INFLUENZA A B PCR, RESPIRATORY UPPER    Narrative:     This test is intended for in vitro diagnostic use under FDA Emergency Use Authorization only. Results are for the presumptive identification of COVID-19, Influenza A, and Influenza B RNA. The [facility] is certified under the Clinical Laboratory Improvement Amendments of 1988 (CLIA-88) as qualified to perform high complexity clinical laboratory testing.   CBC & AUTO DIFFERENTIAL    Narrative:     The following orders were created for panel order CBC & Plt & Diff.  Procedure                               Abnormality         Status                     ---------                               -----------         ------                     ZOX[[id number]]                          Abnormal            Final result               Differential, Automated[[id number]]      Abnormal            Final result                 Please view results for these tests on the individual orders.   RAINBOW DRAW TO LABORATORY    Narrative:     The following orders were created for panel order Rainbow Draw to Laboratory [facility] [place]).  Procedure                               Abnormality         Status                     ---------                               -----------         ------  Extra Light Blue [id number]                             In process                 Extra Burna Mortimer PXT[[id number]]                            In process                   Please view results for these tests on the individual orders.   EXTRA LIGHT BLUE TOP   EXTRA LIGHT GREEN TOP   POCT URINALYSIS DIPSTICK   DRUGS OF ABUSE SCN,UR     Treatment:  Medications   OLANZapine tab disolv 10 mg (has no administration in time range)       The patient responded appropriately to treatment and improved.    Medical Decision Making and ED Course:  Prior and current EMR records were reviewed by myself as available and clinically relevant. Nurses notes and flow sheets were reviewed by myself.      Medical care was complex because The patient arrived at 77  when the patient's primary doctor or clinic was unavailable to provide care due to severe, acute symptoms that the patient felt precluded evaluation at the urgent care, clinic, or primary doctor's office tomorrow or later today.      The patient's care was COMPLEX due to concern for possible SARS/CoV-2/COVID-19 infection.  The patient was evaluated in the context of a recent spike in SAR-CoV-2/COVID-19 cases and the ongoing endemic which necessitated consideration that the patient may be at risk for infection.  Given the emergence of new variants, there are increased risks to this patient. A test was, thus, sent, and PPE was used and maintained for evaluation of this patient.  Institutional protocols that pertain to patients at risk for COVID-19 are in a state of rapid change based on CDC and local guidelines.  Policies and guidelines were followed and met standards of care during the patients care in the ED.      The patient's care was complex due to  acute exacerbation of chronic illness    Extensive review of the medical records was performed including prior notes, imaging studies, and labs.  Urine tox screen was positive in November and December for amphetamines and marijuana    Diagnostic Test Interpretation by me.  Pulse oximetry was 99% consistent with normal room air oxygenation.    Therapeutic Procedure - COVID-19 counseling.  Patient counseled to maintain social distance, wear a mask, and avoid mass gatherings or unnecessary exposures to others.  Hand hygiene was emphasized.      ED course    1315 no change in exam, labs pending.  Case d/w [facility], who is aware.    1400 no change in exam, pt in hall.    1530 patient was trying to choke himself in the hallway and is getting anxiety.  He has a Loss adjuster, chartered were reviewed and noted.  CBC was unremarkable  BMP was unremarkable  Serum toxicology was negative  COVID negative  Flu negative    S/o to Dr. Dyane Dustman pending PET    Care was complicated as pt required serial exams over 2:30 hours in the ED to ensure no acute morbid or mortal disease was present.    I am the  primary physician who cared for the patient in the Emergency Department.    Impression:  1. Suicidal ideation    2. Polysubstance abuse (HCC/RAF) Disposition and Follow Up:  Transfer as per LPS/PET    All scribe entries and documentation made by the scribe were entered at my direction.  I have reviewed this medical record and agree to the accuracy and completeness of the content entered by the scribe.  The documentation recorded by the scribe accurately reflects the service I personally performed and the decisions made by me.    Santa Genera., MD 3:29 PM 10/31/2022       Santa Genera., MD  10/31/22 [phone]

## 2022-10-31 NOTE — ED Notes
Belongings in Locker 2

## 2022-10-31 NOTE — ED Notes
Security at bedside with pt collecting and assessing belongings.  Care partner at bedside for 1:1 observation and safety.

## 2022-11-01 LAB — Oxycodone Urine: OXYCODONE: NEGATIVE ng/mL

## 2022-11-01 LAB — Extra Light Green Top

## 2022-11-01 LAB — Extra Light Blue Top

## 2022-11-01 LAB — Drugs of Abuse Scn,Ur: BARBITURATES: NEGATIVE ng/mL

## 2022-11-01 NOTE — ED Notes
Pt given sandwich and apple juice  

## 2022-11-01 NOTE — ED Notes
Report given to Max at [facility].

## 2022-11-01 NOTE — ED Notes
Report given to Bianca RN

## 2022-11-01 NOTE — ED Notes
Pt eating meal tray at this time. No apparent distress noted.

## 2022-11-01 NOTE — Progress Notes
[facility] called ED unit requesting additional labs. SW handed phone to RN to speak w/ [facility].  RN completed call with [facility] and confirmed all labs received. [facility] will call back the ED in 1hr to 30 mins.

## 2022-12-13 ENCOUNTER — Inpatient Hospital Stay
Admit: 2022-12-13 | Discharge: 2022-12-14 | Disposition: A | Discharge status: Other Acute Inpatient Hospital | Payer: MEDICAID | Source: Home / Self Care

## 2022-12-13 DIAGNOSIS — R45851 Suicidal ideations: Secondary | ICD-10-CM

## 2022-12-13 DIAGNOSIS — Z590 Homelessness: Secondary | ICD-10-CM

## 2022-12-13 LAB — UA,Microscopic: RBCS HPF: 1 {cells}/[HPF] (ref 0–2)

## 2022-12-13 LAB — Drugs of Abuse Scn,Ur: BARBITURATES: NEGATIVE ng/mL

## 2022-12-13 LAB — Basic Metabolic Panel

## 2022-12-13 LAB — UA,Dipstick

## 2022-12-13 LAB — Drug Screen,serum: SALICYLATE,SERUM: <3 mg/dL (ref <=30.0)

## 2022-12-13 LAB — CBC: NEUTROPHILS ABS (PRELIM): 12.4 10*3/uL (ref 31.5–35.5)

## 2022-12-13 LAB — Oxycodone Urine: OXYCODONE: NEGATIVE ng/mL

## 2022-12-13 LAB — Expedited COVID-19 and Influenza A B PCR

## 2022-12-13 LAB — Differential Automated: NEUTROPHIL PERCENT, AUTO: 78.6 (ref 0.00–0.50)

## 2022-12-13 NOTE — Other
[facility] Department of Mental Health - MH 302 NCR                                                 Department of Health Care Services  [place]                                       Health and Hospital doctor   APPLICATION FOR ASSESSMENT, EVALUATION, AND CRISIS INTERVENTION OR PLACEMENT FOR EVALUATION AND TREATMENT  Confidential Client/Patient Information  See [place] W&I Code, Section 5328 and HIPAA Privacy Rule 45 C.F.R. ? 35.508  Welfare and Institutions Code (W&I Code), Section 5150(f) and (g), require that each person, when first detained for psychiatric evaluation, be given certain specific information orally and a record be kept of the advisement by the evaluating facility. DETAINMENT ADVISEMENT  My name is ___[name]_________  I am a (peace officer/mental health professional) with (name of agency).  You are not under criminal arrest, but I am taking you for examination by mental health professionals at (name of facility).     You will be told your rights by the mental health staff.     If taken into custody at his or her residence, the person shall also be told the following information:       [X]  Advisement Complete        [  ] Advisement Incomplete      You may bring a few personal items with you, which I will have to approve. Please inform me if you need assistance turning off any appliance or water. You may make a phone call and leave a note to tell your friends or family where you have been taken.   Good Cause for Incomplete Advisement:                                                                                               Advisement Completed By:  Dorthea Cove        Position:   LCSW        Language or Modality Used:  Spoken English        Date of Advisement:  12/13/2022          To (name of 5150 designated facility): Any LPS Set designer is hereby made for the assessment and evaluation of William Soto     residing at [address], [place], [place], [address], [place], for up to 72-hour assessment, evaluation, and crisis intervention or placement for evaluation and treatment at a designated facility pursuant to Section 5150, et seq. (adult) or Section 5585 et seq. (minor), of the W&I Code. If a minor, authorization for voluntary treatment is not available and to the best of my knowledge, the legally responsible party appears to be/is: (Check one):  []  Parent;  []  Legal Guardian;  []  Conservator;  []   Juvenile Court under W&I Code 300;  []  Juvenile Court under W&I Code 601/602.      If known, provide names, address and telephone numbers in area provided below:   N/A          The above person?s condition was called to my attention under the following circumstances:    [facility], consultation to Psychiatry     [ ]  Amended 5150, original scanned in EHR        I have probable cause to believe that the person is, as a result of a mental health disorder, a danger to others or to himself/herself, or gravely disabled because: (state specific facts):   Patient presents to the hospital reporting command hallucinations telling him to kill himself by jumping off a bridge, and he reports wanting to listen to those voices. Pt is not currently engaged in outpatient psychiatric services or medication. Patient cannot contract for his safety.        (CONTINUED ON NEXT PAGE)   Reference: DHCS 1801 (06/18)                                                                                                                     Page 1 of 4  Original Application: Sales executive to Assessment, Evaluation, and Crisis Intervention Location or 5150/5585 Designated Facility   [facility] Department of Mental Health - MH 302 NCR                                                 Department of Health Care Services  [place]                                       Health and Health and safety inspector Agency            CLIENT NAME: William Soto APPLICATION FOR 72 HOUR DETENTION FOR EVALUATION AND TREATMENT (CONTINUED)     Historical course of the person?s mental disorder:  [X]  I have considered the historical course of the person?s mental disorder: [Includes evidence presented by service/support provider, family member(s), and person subject to probable cause determination or designee.]  Chart Review: information patient    [  ] No reasonable bearing on determination  [  ] No information available because: N/A       History Provided by (Name) Address Phone Number Relation                  Based upon the above information, there is probable cause to believe that said person is, as a result of mental health disorder:     [X]  A danger to himself/herself.  [  ] A danger to others.        [  ]  Gravely disabled adult.  [  ] Gravely disabled minor.        Minors only: []  Based upon the above information, it appears that there is probable cause to believe that authorization for voluntary treatment is not available.   Signature, title, and badge number of Tax adviser, professional person in charge of the facility designated by the county for evaluation and treatment, member of the attending staff, designated members of a mobile crisis team, or professional person designated by [employer].     X Jamill Wetmore Badge/NPI#: [id number]   (Electronically signed)      Date: 12/13/2022       Phone:  [phone]        Time: 11:22 AM         Name of Law Enforcement Agency or Evaluation Facility/Person:    [facility] Address of Sales executive or Evaluation Facility/Person:   [address], [place], [place] [address]   For patients in Medical ER?s, detention began:  Date:  12/13/2022  Time:  8:50AM           NOTIFICATIONS TO BE PROVIDED TO LAW ENFORCEMENT AGENCY     Notify (officer/unit & telephone #): N/A        NOTIFICATION OF PERSON?S RELEASE IS REQUESTED BY THE REFERRING PEACE OFFICER BECAUSE:   [  ] The person has been referred to the facility under circumstances which, based upon an allegation of facts regarding actions witnessed by the officer or another person, would support the filing of a criminal complaint.  []  Weapon was Engineer, drilling to Section 8102 W&I Code. Upon release, facility is required to provide notice to the person regarding the procedure to obtain return of any confiscated firearm pursuant to Section 8102 W&I Code.   Reference: [facility] 1801 (06/18)                                                                                                                     Page 2 of 4  Original Application: Accompany Client to Assessment, Evaluation, and Crisis Intervention Location or 5150/5585 Designated Facility   [facility] [employer] of Mental Health - MH 302 NCR                                                 Department of Health Care Services  [place]                                       Health and Health and safety inspector Agency   SEE SUBSEQUENT PAGES FOR DEFINITIONS AND REFERENCES     DEFINITIONS AND REFERENCES   ?Gravely Disabled? means a condition in which a person, as a result of a mental disorder, is  unable to provided for his or her basic personal needs for food, clothing and shelter.  SECTION 5008(h) W&I Code    ?Gravely Disabled Minor? means a minor who, as a result of a mental disorder, is unable to use the elements of life which are essential to health, safety, and development, including food, clothing, and shelter, even though provided to the minor by others.  Intellectual disability, epilepsy, or other developmental disabilities, alcoholism, other drug abuse, or repeated antisocial behavior do not, by themselves, constitute a mental disorder.  SECTION 5585.25 W&I Code    ?Tax adviser? means a duly Music therapist as that term is defined in Chapter 4.5 (commencing with Section 830) of Title 3 of Part 2 of the Penal Code who has completed the basic training course established by the Commission on [employer], or any Civil Service fast streamer or probation officer specified in Section 830.5 of the Penal Code when acting in relation to cases for which he or she has a legally mandated responsibility. SECTION 5008 (i) W&I Code    Section 5152.1 W&I Code: The professional person in charge of the facility providing 72-hour evaluation and treatment, or his or her designee, shall notify the county Secondary school teacher or the director's designee and the Tax adviser who makes the written application pursuant to Section 5150 or a person who is designated by the Patent examiner agency that employs the Tax adviser, when the person has been released after 72-hour detention, when the person is not detained, or when the person is released before the full period of allowable 72-hour detention if all the conditions apply:    (a)  The Tax adviser requests such notification at the time he or she makes the application and the Tax adviser certifies at that time in writing that the person has been referred to the facility under circumstances which, based upon an allegation of facts regarding actions witnessed by the officer or another person, would support the filing of a criminal complaint.   (b) The notice is limited to the person's name, address, date of admission for 72-hour evaluation and treatment, and date of release.        If a Emergency planning/management officer, Sales executive, or designee of the Sales executive, possesses any record of information obtained pursuant to the notification requirements of this section, the officer, agency, or designee shall destroy that record two years after the receipt of notification.     Section 5150.05 W&I Code:    (a)  When determining if probable cause exists to take a person into custody, or cause a person to be taken into custody, pursuant to Section 5150, any person who is authorized to take that person, or cause that person to be taken, into custody pursuant to that section shall consider available relevant information about the historical course of the person's mental disorder if the authorized person determines that the information has a reasonable bearing on the determination as to whether the person is a danger to others, or to himself or herself, or is gravely disabled as a result of the mental disorder.   (b) For purposes of this section, ''information about the historical course of the person's mental disorder'' includes evidence presented by the person who has provided or is providing mental health or related support services to the person subject to a determination described in subdivision (a), evidence presented by one or more members of the family of that person, and evidence presented by the  person subject to a determination described in subdivision (a) or anyone designated by that person.      Reference: [facility] 1801 (06/18)                                                                                                                     Page 3 of 4  Original Application: Accompany Client to Assessment, Evaluation, and Crisis Intervention Location or 5150/5585 Designated Facility   [facility] Department of Mental Health - MH 302 NCR                                                 Department of Health Care Services  [place]                                       Health and Hospital doctor   DEFINITIONS AND REFERENCES (CONTINUED)    (c)  If the probable cause in subdivision (a) is based on the statement of a person other than the one authorized to take the person into custody pursuant to Section 5150, a member of the attending staff, or a professional person, the person making the statement shall be liable in a civil action for intentionally giving any statement that he or she knows to be false.   (d) This section shall not be applied to limit the application of Section 5328.          Section 5152.2 W&I Code: Each Patent examiner agency within a county shall arrange with the county Secondary school teacher a method for giving prompt notification to English as a second language teacher pursuant to Section 5152.1 W&I Code.     Section 5585.50 W&I Code: The facility shall make every effort to notify the minor's parent or legal guardian as soon as possible after the minor is detained. Section 5585.50 W&I Code.    A minor under the jurisdiction of the [place] under Section 300 W&I Code, is due to abuse, neglect or exploitation.    A minor under the jurisdiction of the Juvenile Court under Section 601 W&I Code is due to being adjudged a ward of the court as a result of being out of parental control.    A minor under the jurisdiction of the Juvenile Court under Section 602 W&I Code is due to being adjudged a ward of the court because of crimes committed.     Section 8102 W&I Code (EXCERPTS FROM):    (a)  Whenever a person who has been detained or apprehended for examination of his or her mental condition or who is a person described in Section 8100 or 8103, is found to own, have in his or her possession or under his or her control, any firearm whatsoever, or any deadly weapon,  the firearm or other deadly weapon shall be confiscated by any IT consultant, who shall retain custody of the firearm or other deadly weapon. ''Deadly weapon,'' as used in this section, has the meaning prescribed by Section 8100.   (b) (1) Upon confiscation of any firearm or other deadly weapon from a person who has been detained or apprehended for examination of his or her mental condition, the Tax adviser or law enforcement agency shall issue a receipt describing the deadly weapon or any firearm and listing any serial number or other identification on the firearm and shall notify the person of the procedure for the return, sale, transfer, or destruction of any firearm or other deadly weapon which has been confiscated. A peace officer or law enforcement agency that provides the receipt and notification described in Section 33800 of the Penal Code satisfies the receipt and notice requirements.  (2) If the person is released, the professional person in charge of the facility, or his or her designee, shall notify the person of the procedure for the return of any firearm or other deadly weapon which may have been confiscated.  (3) Health facility personnel shall notify the confiscating law enforcement agency upon release of the detained person, and shall make a notation to the effect that the facility provided the required notice to the person regarding the procedure to obtain return of any confiscated firearm.        Health and Safety Code [phone] (d)     A person detained under this section in a medical emergency room shall be credited for the time detained, up to twenty-four hours, in the event he or she is placed on a 72-hour hold pursuant to Section 5150 of the Welfare and Institutions Code.     Reference: DHCS 1801 (06/18)                                                                                                                     Page 4 of 4  Original Application: Accompany Client to Assessment, Evaluation, and Crisis Intervention Location or 5150/5585 Designated Facility

## 2022-12-13 NOTE — Consults
SAFE-T Protocol with C-SSRS    Step 1: Identify Risk Factors                                                   Ask questions 1 and 2. If no to 2, proceed directly to question 6. If the answer to question 2 is yes, ask questions 3, 4, 5 and 6. If the answer to question 1 and/or 2 is ?yes?, complete ?Intensity of Ideation? section below.   C-SSRS Suicidal Ideation Severity Last Filed Nursing Screening   (Within the last Month) Social Work Assessment  (Within the last Month)   Wish to be dead  Have you wished you were dead or wished you could go to sleep and not wake up?  Yes Yes   Current suicidal thoughts  Have you had any actual thoughts of killing yourself?   Yes Yes   If YES to 2, ask questions 3, 4, 5, and 6. If NO to 2, go directly to question 6.   Suicidal thoughts with Method (with no specific Plan or Intent or Act)  Have you been thinking about how you might do this?   Yes Yes    Suicidal Intent without Specific Plan  Have you had these thoughts and had some intention of acting on them?   Yes No   Intent with Plan  Have you started to work out or worked out the details of how to kill yourself? Do you intend to carry out this plan?   No No   6) C-SSRS Suicidal Behavior: ?Have you ever done anything, started to do anything, or prepared to do anything to end your life??  (Examples: Collected pills, obtained a gun, gave away valuables, wrote a will or suicide note, took out pills but didn?t swallow any, held a gun but changed your mind or it was grabbed from your hand, went to the roof but didn?t jump; or actually took pills, tried to shoot yourself, cut yourself, tried to hang yourself, etc.)    If ?YES: Was it within the past 3 months?   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   []  Alcohol/substance abuse disorders   []  PTSD  []  ADHD  []  TBI  []  Cluster B Personality disorders or traits (i.e.,        Borderline, Antisocial, Histrionic & Narcissistic)   [x]  Conduct problems (antisocial behavior, aggression,        impulsivity)  []  Recent onset    Presenting Symptoms:   []  Anhedonia   [x]   Impulsivity   []   Hopelessness or despair   [x]   Anxiety and/or panic   []   Insomnia   []   Command hallucinations   []   Psychosis  Family History:   [x]   Suicide   [x]   Suicidal behavior  []   Axis I psychiatric diagnoses requiring hospitalization    Precipitants/Stressors:  []   Triggering events leading to humiliation, shame, and/or despair (e.g.            Loss of relationship, financial or health status) (real or anticipated)  []   Chronic physical pain or other acute  medical problem (e.g. CNS         disorders)   []   Sexual/physical abuse   [x]   Substance intoxication or withdrawal   [x]   Pending incarceration or homelessness   []   Legal problems   [x]   Inadequate social supports  [x]   Social isolation  []   Perceived burden on others     Change in treatment:  [x]   Recent inpatient discharge  []   Change in provider or treatment (i.e., medications, psychotherapy,               milieu)  []   Hopeless or dissatisfied with provider or treatment   [x]   Non-compliant or not receiving treatment    []  Access to lethal methods: Ask specifically about presence or absence of a firearm in the home or ease of accessing   Step 2: Identify Protective Factors (Protective factors may not counteract significant acute suicide risk factors)   Internal:   []   Ability to cope with stress  []   Frustration tolerance  []   Religious beliefs   []   Fear of death or the actual act of killing self  []   Identifies reasons for living   External:   []   Cultural, spiritual and/or moral attitudes against suicide  []   Responsibility to children  []   Beloved pets  []   Supportive social network of family or friends  []   Positive therapeutic relationships  []   Engaged in work or school     Step 3: Specific questioning about Thoughts, Plans, and Suicidal Intent - (see Step 1 for Ideation Severity and Behavior)  If semi-structured interview is preferred to complete this section, clinicians may opt to complete C-SSRS Lifetime/Recent for comprehensive behavior/lethality assessment.    C-SSRS Suicidal Ideation Intensity (with respect to the most severe ideation 1-5 identified above) (Within the last Month)   Frequency  How many times have you had these thoughts?    (1) Less than once a week    (2) Once a week   (3)  2-5 times in week    (4) Daily or almost daily    (5) Many times each day   Patient not responsive to direct question.   Duration  When you have the thoughts how long do they last?     (1) Fleeting - few seconds or minutes                                                    (4) 4-8 hours/most of day   (2) Less than 1 hour/some of the time                                                  (5) More than 8 hours/persistent or continuous   (3) 1-4 hours/a lot of time       Patient not responsive to direct question.   Controllability  Could/can you stop thinking about killing yourself or wanting to die if you want to?     (1) Easily able to control thoughts                                                         (  4) Can control thoughts  with a lot of difficulty   (2) Can control thoughts with little difficulty                                       (5) Unable to control thoughts   (3) Can control thoughts with some difficulty                                     (0) Does not attempt to control thoughts      Patient not responsive to direct question.   Deterrents  Are there things - anyone or anything (e.g., family, religion, pain of death) - that stopped you from wanting to die or acting on thoughts of suicide?                                      (1) Deterrents definitely stopped you from attempting suicide   (4) Deterrents most likely did not stop you    (2) Deterrents probably stopped you   (5) Deterrents definitely did not stop you    (3) Uncertain that deterrents stopped you  (0) Does not apply      Patient not responsive to direct question.   Reasons for Ideation  What sort of reasons did you have for thinking about wanting to die or killing yourself?  Was it to end the pain or stop the way you were feeling (in other words you couldn?t go on living with this pain or how you were feeling) or was it to get attention, revenge or a reaction from others? Or both?     (1) Completely to get attention, revenge or a reaction from others (4) Mostly to end or stop the pain (you couldn?t go on living with the pain or how you were feeling)   (2) Mostly to get attention, revenge or a reaction from others (5) Completely to end or stop the pain (you couldn?t go on living with the pain or how you were feeling)   (3) Equally to get attention, revenge or a reaction from others and to end/stop the pain                                                                                                    (0)  Does not apply          Patient not responsive to direct question.   Total Score                    N/A     Step 4: Guidelines to Determine Level of Risk and Develop Interventions to LOWER Risk Level  ?The estimation of suicide risk, at the culmination of the suicide assessment, is the quintessential clinical judgment, since no study has identified one specific risk factor or set of risk  factors as specifically predictive of suicide or other suicidal behavior.?   From The American Psychiatric Association Practice Guidelines for the Assessment and Treatment of Patients with Suicidal Behaviors, page 24.   RISK STRATIFICATION TRIAGE   High Suicide Risk  []  Suicidal ideation with intent or intent with plan in past month (C-SSRS Suicidal Ideation #4 or #5)    Or    []  Suicidal behavior within past 3 months (C-SSRS Suicidal Behavior)   []   Initiate local psychiatric admission process  []   Stay with patient until transfer to higher level of care is complete  []   Follow-up and document outcome of emergency psychiatric evaluation   Moderate Suicide Risk  []  Suicidal ideation with method, WITHOUT plan, intent or behavior       in past month (C-SSRS Suicidal Ideation #3)  Or  []  Suicidal behavior more than 3 months ago (C-SSRS Suicidal Behavior Lifetime)  Or  []  Multiple risk factors and few protective factors []   Directly address suicide risk, implementing suicide prevention strategies  []   Develop Safety Plan     Low Suicide Risk  [x]  Wish to die or Suicidal Ideation WITHOUT method, intent, plan or behavior (C-SSRS Suicidal Ideation #1 or #2)   Or  []  Modifiable risk factors and strong protective factors  Or  []  No reported history of Suicidal Ideation or Behavior [x]   Discretionary Outpatient Referral       Step 5: Documentation   Risk Level :  []   High Suicide Risk  []   Moderate Suicide Risk  [x]   Low Suicide Risk   Clinical Note:    Your Clinical Observation  Patient presents to ED reporting SI with plan to jump off bridge. Patient not appropriately answering direct questions asked and unable to be assessed for alertness/orientation.       Relevant Mental Status Information   Patient presents to ED reporting SI with plan to jump off bridge. Patient not appropriately answering direct questions asked and unable to be assessed for alertness/orientation. Patient has history of presenting to ED with SI and not complying with outpatient care, including psych medication.      Methods of Suicide Risk Evaluation  Clinical observation, 1:1 interview, chart review.       Brief Evaluation Summary  Patient is a 47 year old male who is currently  homeless with history of depression, anxiety, and meth use. Patient reports having Schizophrenia.  Patient reporting reports SI and wanting to jump off a bridge. Patient reports limited social and family support in [place]. Patient non-compliant with  outpatient mental health services, including medication. Patient has presented to ED multiple times for similar chief complaint; 08/28/22, 10/08/22, 10/14/22, 10/31/22 and today. Patient told CSW he is not willing to take psych medication or comply with outpatient care. Patient not connected to psychiatrist or therapist.      Warning Signs  Non-compliant with psych medication  Homeless  No positive therapeutic relationship (therapist/psychiatrist)      Risk Indicators  Patient reports wanting to jump off a bridge.       Protective Factors  Limited protective factors, however pt self-presented to the ED seeking help.       Access to Lethal Means  Patient denies owning a gun or having access to lethal means.       Collateral Sources Used and Relevant Information Obtained  None. Chart review completed.       Specific Assessment Data to Support Risk Determination  SAFE-T CSSRS  Rationale for Actions Taken and Not Taken  Patient indicated to be low-risk, active SI and non-compliant with outpatient care/medication.       Provision of Crisis Line 1-800-273-TALK(8255)  Provision of Crisis Text Line (Text HOME to [phone])    Implementation of Safety Plan (If Applicable)  N/A             Clinician Name:  Merlinda Frederick, ASW, MSW

## 2022-12-13 NOTE — Discharge Instructions
Emergency Department Discharge Instructions      Summary of your visit  You have been evaluated in the [facility] Emergency Department today for your psychiatric complaint.  You were evaluated by both Emergency Medicine and Psychiatry staff and have been cleared to go home.    Follow-Up  Please follow up with your psychiatrist within 2-3 days. Below, you can find a list of community psychiatric resources you can use.  If you are uninsured, please call 2-1-1 to find a free or low-cost clinic in your area. 2-1-1 [place] is the central source for providing information and referrals for all health and human services in [facility] [place]. Our 2-1-1 phone line is open 24 hours, 7 days a week, with trained [employer] prepared to offer help with any situation, any time. Our community services go far beyond phone referrals - explore our website to learn more. If you are calling from outside [facility] or cannot directly dial 2-1-1, you can call [phone].      Return to the Emergency Department if you experience:  Suicidal thoughts, a plan to harm yourself, and the means to do so   Serious thoughts of hurting someone else  Hearing voices that others do not hear  Seeing things that others do not see  Being unable to care for yourself   Any other concerning symptoms     Thank you for choosing [facility] for your care. It was a pleasure taking part in your care today, and we wish you the best!     COMMUNITY PSYCHIATRY REFERRALS    Suicide Hotline: 443 756 1463 or 800-273-TALK 762-086-0644) or [phone] (Spanish)    [facility] Psychiatric Emergency Team/PMRT:  [phone] or [phone]    [facility] SMART Team:   [phone]    [facility]: [phone]. [place] has many psychiatric outpatient clinics.     Call your insurance for a list of psychiatrists and psychotherapists and outpatient programs    MENTAL HEALTH SERVICES              Hours or services may vary due to COVID-19  [facility]..?????????????????????..?.??..[phone]  [address] [facility] [address] * Open 24 hours * Walk-ins welcome    [facility]?????.?????????.??.........?..[phone]  [address] [place]  [address] - 4th Floor  * Mon - Fri 8a - 5p    [facility]??????????.????...?..?..?..[phone]  [address] [place], [place] [address] * [place] - Fri. 8:30a - 5p    [place] Dept of Mental Health Hotline (24/7)??????..???????(800) 504-086-7083    [facility] (serves ages 85-59)??..????.????????..?..[phone]  [address] [place] [address] * [phone] - Fri, 9:30a - 11a    [facility]?????????????????????????...??..[phone]  [address] [place] [place] [address] or [facility], [address]????????..?..[phone]    [facility]?.????...?....?..[phone]  [address] [place], [place] [address] * Sher[place] Flow - Thurs 8a - 8p & Fri. 8a - 5p    [facility] Self-help?????????????..???????..??????..[phone]  Offers a range of peer-led self-help support groups. At two locations: [facility]   Call for more information [phone] - Sun 7a - 930p    [facility]:   [facility]: [phone], [address] [place]: [phone], [phone] [facility]  Open 24 hours per day, 365 days per year, on a walk-in basis  individuals in crisis can  be assessed for stabilization services, medication refills/ evaluation and management, or hospitalization if necessary.      [facility] Mental Health Clinics: must present to the clinic designated for service area. Call 24/7 Access Line: [phone] for clinic locations, hours, etc.   [facility]: [phone], [address] [place] [address]  [place]: [phone], [address] [facility], [place] [address]  Jearld Lesch: [phone], [address], [place], [address]  [place]: [phone], [address], [place], [place] [address]  Hollice EspyGriffith Citron [phone], [address].   [place]: [phone], [address] [place] [address]  Laurin Coder: [phone], [address] [phone], [address], [place] [address]  [place]: [phone], [address]  [place]: [phone], [address] [place] [address]  [place]: [phone], [address], [place], [place], [address]  [place]: [phone], [address], [place] [address]  [place]: [phone], [address], [place] [address]  [place]: [phone], [address], [place] [address]  [place]: [phone], [address], [place], [address]  [place]: [phone], [address], [place] [address]  [place]: [phone], [address] [place], [place] [address]  [place]: [facility] [phone], [address].  [facility]: [phone], [address], [place] [address]  Ruben ImCharlott Holler [phone], [address] [place], [place], [place] [address]  [place]: [phone], [address], [place] [address]  [facility]: [phone]  Walk-in appointments:  [address] 10-11:30 M, W-F or 2-3:30 M, T, F  [address] [facility]. 10-11:30 M/F or 2-3:30 T, W, F  [address] (teens only) 2-5 M-W  [address] Th 3:30-4:30      Other County Mental Health Clinics:  [facility] Mental Health: [phone]  [facility] Mental Health: [phone]  [facility] Mental Health [phone]  [facility] Mental Health [phone]  [facility] [phone]  [facility] Mental Health [phone]    Spanish Language Clinics  [facility] - [phone] -contact [address] [place]: [phone] - therapy, case management and medication/psychiatric support in [facility]        [phone] [address] [place], [place]   Counseling offered on a sliding fee scale basis. Support groups, HIV care, anger management, case management and other services also available.    Homeless Shelters  [facility] [phone] inform of available shelters in local areas  [facility] Shel[facility] [phone]  [place] [phone]    Homeless Drop-In Centers:  [facility]: [address], [place] [phone]  Drop-in center for clothing, showers, sack lunches, and referrals to shelters  [facility] [place] [address], [place] [phone]   Emergency services, including shower, laundry, mail, clothing, counseling, and case management. All services provided free of charge. Orientation 8am M-F.  [facility] [address] [place], [address] [place] [phone]  Psychotherapy, case management, drop-in center, and other services.  [facility]) [address] [place], [place] [phone]  Wide ranging outpatient services--therapy, psychiatry, groups, case management, referrals. All qualify for services.  [facility] [address] [place] [phone]  [facility] [address], [place]  (  [phone]   Walk-in drop-in center. Assistance with housing, groups, therapy, psychiatry. Meals  [facility]  [address] [place], [phone]   Outreach, case management and housing placement services. laundry, showers. No fee.      Domestic Violence Shelters  Domestic Violence Drop-In and [employer] [phone]  [facility] Service DV Program [phone], [facility] [phone], [facility] (women and children) [phone]  [facility] [phone] [place], [facility]   [facility] [phone], [address]  Renita Papa [phone], [facility] [phone], [facility] [place] [phone], [place] [phone], [facility]    Rape Treatment Center  [facility]  [address] [place], [place] [address]  [phone]    Financial Assistance:  [place] [address] Department of Public Social Services (General Relief, [place], Medi-Cal etc.) [phone]  Local Office: [address] [place], [place] [phone]  Medi-Cal [phone]  Social Security Administration (SSI, [place], SSDI) - 24-hour Info Line [phone]   Local Office: [address] [place], [address], [place] [address]

## 2022-12-13 NOTE — ED Notes
Pt resting in bed.No needs noted. Need Urine still

## 2022-12-13 NOTE — Progress Notes
Patient is medically cleared. Patient also on 5150. CSW notified patient, patient's MD, nurse and charge nurse.     CSW sent clinical packet to the following facilities:  -[facility]-email sent   -[facility]    CSW asked patient about insurance coverage. Patient says he has Medical, however unable to provide information or social. Patient remains calm and cooperative.     2:00P  Patient accepted to [facility] Adult Unit [facility]. CSW notified patient's nurse, MD and charge nurse. CSW completed transfer checklist and requested transport.     Patient aware. Transport ETA TBD. No other needs at this time.     Update:  Transport ETA 4:00P

## 2022-12-13 NOTE — ED Notes
Bath wipes given to patient, assisted in bed bath with care partner. Pt is calm and cooperative. Meal tray ordered for patient.

## 2022-12-13 NOTE — ED Provider Notes
[facility]  Emergency Department Service Report    William Soto 47 y.o. male , presents with Psychiatric Evaluation      Triage   Arrived on 12/13/2022 at 8:23 AM   Arrived by Walk-in [14]    ED Triage Vitals   Temp Temp Source BP Heart Rate Resp SpO2 O2 Device Pain Score Weight   12/13/22 0826 12/13/22 0826 12/13/22 0826 12/13/22 0826 12/13/22 0826 12/13/22 0826 12/13/22 0900 -- 12/13/22 0827   36.5 ?C (97.7 ?F) Oral 145/81 78 17 98 % None (Room air)  65.8 kg (145 lb)       Pre hospital care:       No Known Allergies    History   HPI     William Soto is a 47 y.o. male with PMHx of homelessness, anxiety, depression, presents to the ED for psychiatric evaluation.  Patient reports endorsing auditory and visual hallucinations. States the voices he's hearing are telling him to jump off a bridge and hurt himself and he feels that he wants to listen to the voices. Not currently on medications. Denies any other symptoms such as fevers, cp, sob, abdomina pain. Denies EtoH, illicit drug use or other substances.     Past Medical History:   Diagnosis Date    Anxiety     Depression         No past surgical history on file.     Past Family History   Family history reviewed by me and there is no pertinent past family history related to the patient's current case and/or care.             Past Social History   he reports that he has been smoking cigarettes. He has never used smokeless tobacco. He reports current alcohol use. No history on file for drug use and sexual activity.       Physical Exam   Physical Exam  Vitals and nursing note reviewed.   Constitutional:       General: He is not in acute distress.     Appearance: Normal appearance. He is not ill-appearing, toxic-appearing or diaphoretic.   HENT:      Head: Normocephalic and atraumatic.      Mouth/Throat:      Mouth: Mucous membranes are moist.   Eyes:      Extraocular Movements: Extraocular movements intact.      Conjunctiva/sclera: Conjunctivae normal. Pupils: Pupils are equal, round, and reactive to light.   Cardiovascular:      Rate and Rhythm: Normal rate.      Heart sounds: No murmur heard.     No friction rub. No gallop.   Pulmonary:      Effort: Pulmonary effort is normal. No respiratory distress.      Breath sounds: Normal breath sounds. No rhonchi.   Abdominal:      General: Abdomen is flat.      Palpations: Abdomen is soft.   Musculoskeletal:         General: Normal range of motion.      Cervical back: Normal range of motion and neck supple. No rigidity.   Skin:     Capillary Refill: Capillary refill takes less than 2 seconds.   Neurological:      General: No focal deficit present.      Mental Status: He is alert and oriented to person, place, and time.   Psychiatric:      Comments: Auditory hallucinations.  ED Course     ED Course as of 12/13/22 1256   Thu Dec 13, 2022   0853  [JT]   1200 WBC of 15, given overall well appearing, no signs of infection, no history concerning for infection, afebrile here, not tachycardic, likely demargination.  Low clinical suspicion for infection. [JT]   1252 Spoke with Child psychotherapist, patient is now on a legal hold [JT]   1254 Patient is medically cleared. [JT]      ED Course User Index  [JT] Gildardo Griffes., MD, MPH       Laboratory Results     Labs Reviewed   BASIC METABOLIC PANEL - Abnormal; Notable for the following components:       Result Value    Potassium 3.5 (*)     Creatinine 0.44 (*)     All other components within normal limits   UA,DIPSTICK - Abnormal; Notable for the following components:    Ketones 3+ (*)     Glucose Trace (*)     Protein 1+ (*)     All other components within normal limits   CBC (PERFORMABLE) - Abnormal; Notable for the following components:    White Blood Cell Count 15.76 (*)     All other components within normal limits   DIFFERENTIAL, AUTOMATED (PERFORMABLE) - Abnormal; Notable for the following components:    Absolute Neut Count 12.40 (*)     Absolute Mono Count 1.65 (*) Absolute Immature Gran Count 0.07 (*)     All other components within normal limits   DRUGS OF ABUSE SCN,UR - Abnormal; Notable for the following components:    Amphetamine/Meth Positive (*)     Cannabinoids Positive (*)     Fentanyl Positive (*)     All other components within normal limits    Narrative:     If the screen result is not consistent with the patient's medication(s), confirmation testing should be ordered for the drug(s) of interest.    Unconfirmed screening results should be used only for medical (i.e., treatment) purposes and not for non-medical purposes (e.g., employment testing, legal testing.)    Phencyclidine testing is not included in the drug screen. It can be ordered separately (test [phone]).   SEDATIVE-HYPNOTIC DRUG SCREEN, SERUM - Abnormal; Notable for the following components:    Acetaminophen,serum <10 (*)     All other components within normal limits    Narrative:     Unconfirmed screening results should be used only for medical (i.e.,treatment) purposes and not for non-medical purposes (e.g.,employment testing, legal testing).   UA,MICROSCOPIC - Normal   OXYCODONE, URINE - Normal   EXPEDITED COVID-19 AND INFLUENZA A B PCR, RESPIRATORY UPPER    Narrative:     This test is intended for in vitro diagnostic use under FDA Emergency Use Authorization only. Results are for the presumptive identification of COVID-19, Influenza A, and Influenza B RNA. The [facility] is certified under the Clinical Laboratory Improvement Amendments of 1988 (CLIA-88) as qualified to perform high complexity clinical laboratory testing.   URINALYSIS W/REFLEX TO CULTURE    Narrative:     The following orders were created for panel order Urinalysis w/Reflex to Culture.  Procedure                               Abnormality         Status                     ---------                               -----------         ------  UA,Dipstick[[id number]]                  Abnormal Final result               UA,Microscopic[[id number]]               Normal              Final result                 Please view results for these tests on the individual orders.   CBC & AUTO DIFFERENTIAL    Narrative:     The following orders were created for panel order CBC & Auto Differential.  Procedure                               Abnormality         Status                     ---------                               -----------         ------                     YNW[[id number]]                          Abnormal            Final result               Differential, Automated[[id number]]      Abnormal            Final result                 Please view results for these tests on the individual orders.       Imaging Results     No orders to display       Administered Medications     Medication Administration from 12/13/2022 0823 to 12/14/2022 6578       None            Procedures   Procedural Sedation  Procedures    Medical Decision Making   William Soto is a 47 y.o. male with PMHx of homelessness, anxiety, depression, presents to the ED for psychiatric evaluation.       Overall, patient is well appearing, stable vital signs, no acute distress.  Patient endorses SI with plan to jump off a bridge secondary to hearing voices.  Endorses symptoms going on for 30 days.  Not on medications.  Denies any physical complaints, no trauma on exam.  Doubt infectious versus cardiac versus pulmonary versus intra-abdominal underlying processes.  Patient also denies any EtOH, or illicit substance use or taking other meds or  substances.    Planned for labs, medical hold, for psych evaluation.    Medical Decision Making  Amount and/or Complexity of Data Reviewed  Labs: ordered.      Clinical Impression     1. Suicidal ideation    2. Homelessness          Prescriptions     Discharge Medication List as of 12/13/2022  4:57 PM          Disposition and Follow-up   Disposition: Transfer to Another Facility [  2]    No future appointments.    Follow up with:  No follow-up provider specified.    Return precautions are specified on After Visit Summary.      Scribe Signature   I, Sharalyn Ink , have acted as a Stage manager for patient William Soto on behalf of Dr. Allene Dillon at 12/13/2022 at 8:46 AM. All documentation underwent a comprehensive review by the listed physician(s) and received their approval upon signing.                Gildardo Griffes., MD, MPH  12/14/22 [phone]

## 2022-12-13 NOTE — Other
[place] of [place] - Health and Investment banker, corporate of Health Care Services   INVOLUNTARY PATIENT ADVISEMENT  (TO BE READ AND GIVEN TO THE  PATIENT AT TIME OF ADMISSION) Confidential Patient Information  See W&I Code Section 5328 and   HIPAA Priacy Rule 45 C.F.R. Section 164.508   Name of Facility     [facility] at [facility]   Patient?s Name     William Soto Admission Date     12/13/2022   Section 5150(h) of the Welfare and Institutions Code requires that each person admitted to a facility designated by the county for evaluation and treatment be given specific information orally and in writing, and in a language or modality accessible to the person and a record of the advisement be kept in the person?s medical record.   My name is Karem Farha My position here is  LCSW   You are being placed in this psychiatric facility because it is our professional opinion, that as a result of a mental health disorder, you are likely to: (check applicable)   [X]  Harm yourself [  ] Harm someone else [  ] Be unable to be take care of your own food clothing or shelter   (List specific facts upon which the allegation of dangerous or gravely disabled due to mental health disorder is based, including pertinent facts arising from the admission interview):   We believe this is true because  You are reporting hearing voices telling you to kill yourself, and you have a plan to jump off of a bridge. You are unable to contract for your safety.    You will be held for a period of up to 72 hours. This ( []  does not ) ( [x]  does) include weekends or holidays.   Your 72-hour period begins: 8:50AM on 12/13/2022     (Time and Date)   Your 72-hour evaluation and treatment period will end at: 8:50AM on 12/16/2022    (Time and Date)   You will be held for a period up to 72 hours. During the 72 hours you may also be transferred to another facility. You may request to be evaluated or treated at a facility of your choice. You may request to be evaluated or treated by a mental health professional of your choice. We cannot guarantee the facility or mental health professional you choose will be available, but we will honor your choice if we can.     During these 72 hours you will be evaluated by the facility staff, and you may be given treatment, including medications. It is possible for you to be released before the end of the 72 hours. But if the staff decides that you need continued treatment you can be held for a longer period of time. If you are held longer than 72 hours, you have the right to a lawyer and a qualified interpreter and a hearing before a judge. If you are unable to pay for the lawyer, then one will be provided to you free of charge.     If you have questions about your legal rights, you may contact the county Patients? Rights Advocate at [phone] or [phone] (phone number of county Patients? Rights Metallurgist).   Good cause for Incomplete Advisement N/A Date N/A   Advisement Completed by  (Electronically signed by)  Dorthea Cove Position  LCSW Language or Modality Used  Spoken English Date  12/13/2022   CC: Original to the Patient  DHCS 1802 (10/2012) [place] to the Patient?s Record

## 2022-12-13 NOTE — ED Notes
Pt sleeping. 

## 2022-12-13 NOTE — ED Notes
Urine gathered. Pt resting in bed

## 2022-12-13 NOTE — ED Notes
Report called Trudee Grip at [facility] [phone])

## 2024-07-22 ENCOUNTER — Emergency Department (EMERGENCY_DEPARTMENT_HOSPITAL)
Admission: EM | Admit: 2024-07-22 | Discharge: 2024-07-23 | Disposition: A | Discharge status: Other Acute Inpatient Hospital | Source: Home / Self Care | Attending: Emergency Medicine | Admitting: Emergency Medicine

## 2024-07-22 ENCOUNTER — Other Ambulatory Visit: Payer: Self-pay

## 2024-07-22 DIAGNOSIS — F250 Schizoaffective disorder, bipolar type: Secondary | ICD-10-CM | POA: Diagnosis not present

## 2024-07-22 DIAGNOSIS — F17200 Nicotine dependence, unspecified, uncomplicated: Secondary | ICD-10-CM | POA: Insufficient documentation

## 2024-07-22 DIAGNOSIS — F251 Schizoaffective disorder, depressive type: Secondary | ICD-10-CM | POA: Diagnosis not present

## 2024-07-22 DIAGNOSIS — R45851 Suicidal ideations: Secondary | ICD-10-CM | POA: Diagnosis not present

## 2024-07-22 DIAGNOSIS — F5105 Insomnia due to other mental disorder: Secondary | ICD-10-CM | POA: Diagnosis not present

## 2024-07-22 DIAGNOSIS — F99 Mental disorder, not otherwise specified: Secondary | ICD-10-CM | POA: Diagnosis not present

## 2024-07-22 DIAGNOSIS — F259 Schizoaffective disorder, unspecified: Secondary | ICD-10-CM | POA: Diagnosis not present

## 2024-07-22 LAB — COMPREHENSIVE METABOLIC PANEL WITH GFR
ALT: 20 U/L (ref 0–44)
AST: 25 U/L (ref 15–41)
Albumin: 4.5 g/dL (ref 3.5–5.0)
Alkaline Phosphatase: 84 U/L (ref 38–126)
Anion gap: 12 (ref 5–15)
BUN: 19 mg/dL (ref 6–20)
CO2: 26 mmol/L (ref 22–32)
Calcium: 9.7 mg/dL (ref 8.9–10.3)
Chloride: 104 mmol/L (ref 98–111)
Creatinine, Ser: 0.67 mg/dL (ref 0.61–1.24)
GFR, Estimated: >60 mL/min (ref >60)
Glucose, Bld: 84 mg/dL (ref 70–99)
Potassium: 4.2 mmol/L (ref 3.5–5.1)
Sodium: 142 mmol/L (ref 135–145)
Total Bilirubin: 0.4 mg/dL (ref 0.0–1.2)
Total Protein: 7.4 g/dL (ref 6.5–8.1)

## 2024-07-22 LAB — CBC
HCT: 45.5 % (ref 39.0–52.0)
Hemoglobin: 14.6 g/dL (ref 13.0–17.0)
MCH: 30.7 pg (ref 26.0–34.0)
MCHC: 32.1 g/dL (ref 30.0–36.0)
MCV: 95.8 fL (ref 80.0–100.0)
Platelets: 371 K/uL (ref 150–400)
RBC: 4.75 MIL/uL (ref 4.22–5.81)
RDW: 12.7 % (ref 11.5–15.5)
WBC: 10.7 K/uL — ABNORMAL HIGH (ref 4.0–10.5)
nRBC: 0 % (ref 0.0–0.2)

## 2024-07-22 LAB — URINE DRUG SCREEN
Amphetamines: NEGATIVE
Barbiturates: NEGATIVE
Benzodiazepines: NEGATIVE
Cocaine: NEGATIVE
Fentanyl: NEGATIVE
Methadone Scn, Ur: NEGATIVE
Opiates: NEGATIVE
Tetrahydrocannabinol: NEGATIVE

## 2024-07-22 LAB — ETHANOL: Alcohol, Ethyl (B): <15 mg/dL (ref <15)

## 2024-07-22 MED ORDER — NICOTINE 21 MG/24HR TD PT24
21.0000 mg | MEDICATED_PATCH | Freq: Every day | TRANSDERMAL | Status: DC
  Administered 2024-07-22: 21 mg via TRANSDERMAL
  Filled 2024-07-22: qty 1

## 2024-07-22 MED ORDER — ZIPRASIDONE MESYLATE 20 MG IM SOLR
20.0000 mg | INTRAMUSCULAR | Status: DC | PRN
  Filled 2024-07-22: qty 20

## 2024-07-22 MED ORDER — ALUM & MAG HYDROXIDE-SIMETH 200-200-20 MG/5ML PO SUSP: 30.0000 mL | Freq: Four times a day (QID) | ORAL | Status: DC | PRN

## 2024-07-22 MED ORDER — RISPERIDONE 0.5 MG PO TBDP
2.0000 mg | ORAL_TABLET | Freq: Three times a day (TID) | ORAL | Status: DC | PRN
  Administered 2024-07-22 – 2024-07-23 (×2): 2 mg via ORAL
  Filled 2024-07-22 (×2): qty 4

## 2024-07-22 MED ORDER — LORAZEPAM 1 MG PO TABS
1.0000 mg | ORAL_TABLET | ORAL | Status: AC | PRN
  Administered 2024-07-23: 1 mg via ORAL
  Filled 2024-07-22: qty 1

## 2024-07-22 MED ORDER — ACETAMINOPHEN 325 MG PO TABS: 650.0000 mg | ORAL_TABLET | ORAL | Status: DC | PRN

## 2024-07-22 MED ORDER — ZOLPIDEM TARTRATE 5 MG PO TABS
5.0000 mg | ORAL_TABLET | Freq: Every evening | ORAL | Status: DC | PRN
  Administered 2024-07-22: 5 mg via ORAL
  Filled 2024-07-22: qty 1

## 2024-07-22 MED ORDER — ONDANSETRON HCL 4 MG PO TABS: 4.0000 mg | ORAL_TABLET | Freq: Three times a day (TID) | ORAL | Status: DC | PRN

## 2024-07-22 NOTE — ED Notes (Signed)
 Pts blood was draw, pt dressed out, urine collected, and pt wanded by security. Pt taken to [facility] per charge. [facility] RN told me to take him to 84.  Pt belongings placed in 43.

## 2024-07-22 NOTE — ED Triage Notes (Signed)
 PT ambulatory to triage reporting suicidal thoughts and auditory hallucinations that began approx 1 month ago, and has worsened. Pt states that he has not taken his Seroquel  for approx 3 months, due to moving. Voices are telling him to kill himself.   Pt states that he has been thinking about jumping off a bridge. Endorses a hx of intentional overdose.   Denies thoughts of harming others.

## 2024-07-22 NOTE — ED Notes (Signed)
 Py pacing back and forth down the hallway. Pt yelling at no one in particular but was agitated.

## 2024-07-22 NOTE — BH Assessment (Signed)
 TTS Consult will be completed by IRIS. IRIS provider will notify team of assessment time and provider name. Thanks

## 2024-07-22 NOTE — ED Provider Notes (Signed)
 [place] EMERGENCY DEPARTMENT AT [facility] Provider Note   CSN: [id number] Arrival date & time: 07/22/24  1737     Patient presents with: Psychiatric Evaluation and Suicidal   [name] [name] is a 48 y.o. male.   The history is provided by the patient and medical records. No language interpreter was used.     48 year old male significant history of polysubstance use, anxiety, depression, mood disorder, bipolar presenting with having suicidal thoughts.  Patient endorsed having pervasive suicidal thoughts ongoing for the past month.  Today he thought about jumping off a bridge.  He states he is hearing voices telling him to harm himself.  He felt paranoid and agitated with people around him.  He mention he ran out of his medication for at least 3 months.  Does consume alcohol  last use was yesterday and using tobacco product.  Denies any cocaine heroin use.  Denies any active pain.  He is requesting something to eat.  Patient is here voluntarily.  Prior to Admission medications   Medication Sig Start Date End Date Taking? Authorizing Provider  cloNIDine  (CATAPRES ) 0.1 MG tablet Take 1 tablet (0.1 mg total) by mouth 2 (two) times daily. 10/10/21   Massengill, Rankin, MD  gabapentin  (NEURONTIN ) 300 MG capsule Take 1 capsule (300 mg total) by mouth 3 (three) times daily. 7 days supply for alcohol  withdrawals Patient not taking: Reported on 10/05/2021 09/25/21   Tex Drilling, NP  Multiple Vitamin (MULTIVITAMIN WITH MINERALS) TABS tablet Take 1 tablet by mouth daily. Patient not taking: Reported on 10/05/2021 09/26/21   Tex Drilling, NP  nicotine  (NICODERM CQ  - DOSED IN MG/24 HOURS) 21 mg/24hr patch Place 1 patch (21 mg total) onto the skin daily. 10/11/21   Massengill, Rankin, MD  sertraline  (ZOLOFT ) 100 MG tablet Take 1 tablet (100 mg total) by mouth daily. 10/11/21   Massengill, Rankin, MD  thiamine  100 MG tablet Take 1 tablet (100 mg total) by mouth daily. Patient not taking:  Reported on 10/05/2021 09/26/21   Tex Drilling, NP  traZODone  (DESYREL ) 100 MG tablet Take 1 tablet (100 mg total) by mouth at bedtime as needed for sleep. 10/10/21   Massengill, Rankin, MD    Allergies: Lithium and Penicillins    Review of Systems  All other systems reviewed and are negative.   Updated Vital Signs BP 129/86 (BP Location: Right Arm)   Pulse 89   Temp 98.2 F (36.8 C) (Oral)   Resp 16   SpO2 99%   Physical Exam Constitutional:      General: He is not in acute distress.    Appearance: He is well-developed.  HENT:     Head: Atraumatic.  Eyes:     Conjunctiva/sclera: Conjunctivae normal.  Cardiovascular:     Rate and Rhythm: Normal rate and regular rhythm.     Pulses: Normal pulses.     Heart sounds: Normal heart sounds.  Pulmonary:     Effort: Pulmonary effort is normal.     Breath sounds: Normal breath sounds.  Abdominal:     Palpations: Abdomen is soft.     Tenderness: There is no abdominal tenderness.  Musculoskeletal:     Cervical back: Normal range of motion and neck supple.  Skin:    Findings: No rash.  Neurological:     Mental Status: He is alert.     GCS: GCS eye subscore is 4. GCS verbal subscore is 5. GCS motor subscore is 6.  Psychiatric:  Mood and Affect: Mood normal.        Speech: Speech normal.        Behavior: Behavior is cooperative.        Thought Content: Thought content is paranoid. Thought content includes suicidal ideation. Thought content does not include homicidal ideation.     (all labs ordered are listed, but only abnormal results are displayed) Labs Reviewed  CBC - Abnormal; Notable for the following components:      Result Value   WBC 10.7 (*)    All other components within normal limits  COMPREHENSIVE METABOLIC PANEL WITH GFR  ETHANOL  URINE DRUG SCREEN    EKG: None  Radiology: No results found.   Procedures   Medications Ordered in the ED  risperiDONE (RISPERDAL M-TABS) disintegrating tablet 2  mg (2 mg Oral Given 07/22/24 2000)    And  LORazepam  (ATIVAN ) tablet 1 mg (has no administration in time range)    And  ziprasidone  (GEODON ) injection 20 mg (has no administration in time range)  acetaminophen  (TYLENOL ) tablet 650 mg (has no administration in time range)  zolpidem  (AMBIEN ) tablet 5 mg (5 mg Oral Given 07/22/24 2000)  ondansetron  (ZOFRAN ) tablet 4 mg (has no administration in time range)  alum & mag hydroxide-simeth (MAALOX/MYLANTA) 200-200-20 MG/5ML suspension 30 mL (has no administration in time range)  nicotine  (NICODERM CQ  - dosed in mg/24 hours) patch 21 mg (21 mg Transdermal Patch Applied 07/22/24 1942)                                    Medical Decision Making Amount and/or Complexity of Data Reviewed Labs: ordered.  Risk OTC drugs. Prescription drug management.   BP 129/86 (BP Location: Right Arm)   Pulse 89   Temp 98.2 F (36.8 C) (Oral)   Resp 16   SpO2 99%   78:53 PM  48 year old male significant history of polysubstance use, anxiety, depression, mood disorder, bipolar presenting with having suicidal thoughts.  Patient endorsed having pervasive suicidal thoughts ongoing for the past month.  Today he thought about jumping off a bridge.  He states he is hearing voices telling him to harm himself.  He felt paranoid and agitated with people around him.  He mention he ran out of his medication for at least 3 months.  Does consume alcohol  last use was yesterday and using tobacco product.  Denies any cocaine heroin use.  Denies any active pain.  He is requesting something to eat.  Patient is here voluntarily.  On exam he is calm and cooperative and answering questions appropriately.  He does not appear to be responding to either internal or external stimuli.  Will perform medical screening exam and will consult TTS and psychiatry for further psychiatric management.  -Labs ordered, independently viewed and interpreted by me.  Labs remarkable for reassuring  labs -The patient was maintained on a cardiac monitor.  I personally viewed and interpreted the cardiac monitored which showed an underlying rhythm of: sinus tachycardia -Imaging not considered -This patient presents to the ED for concern of suicidal ideation, this involves an extensive number of treatment options, and is a complaint that carries with it a high risk of complications and morbidity.  The differential diagnosis includes depression, drug induced mood instability, psychosis -Co morbidities that complicate the patient evaluation includes depression ,anxiety, drug use -Treatment includes supportive care -Reevaluation of the patient after these medicines showed that  the patient improved -PCP office notes or outside notes reviewed -Discussion with specialist include TTS and psychiatry -Escalation to admission/observation considered: patient is medically cleared for psych evaluation and management.         Final diagnoses:  Suicidal ideation    ED Discharge Orders     None          Nivia Colon, PA-C 07/22/24 2047    Pamella Ozell LABOR, DO 07/30/24 [phone]

## 2024-07-23 ENCOUNTER — Encounter (HOSPITAL_COMMUNITY): Payer: Self-pay

## 2024-07-23 ENCOUNTER — Other Ambulatory Visit: Payer: Self-pay

## 2024-07-23 ENCOUNTER — Inpatient Hospital Stay (HOSPITAL_COMMUNITY)
Admission: AD | Admit: 2024-07-23 | Discharge: 2024-07-28 | DRG: 885 | Disposition: A | Discharge status: Home / Self Care | Source: Intra-hospital | Attending: Student in an Organized Health Care Education/Training Program | Admitting: Student in an Organized Health Care Education/Training Program

## 2024-07-23 DIAGNOSIS — Z9109 Other allergy status, other than to drugs and biological substances: Secondary | ICD-10-CM | POA: Diagnosis not present

## 2024-07-23 DIAGNOSIS — F5105 Insomnia due to other mental disorder: Secondary | ICD-10-CM

## 2024-07-23 DIAGNOSIS — Z880 Allergy status to penicillin: Secondary | ICD-10-CM | POA: Diagnosis not present

## 2024-07-23 DIAGNOSIS — F419 Anxiety disorder, unspecified: Secondary | ICD-10-CM | POA: Diagnosis present

## 2024-07-23 DIAGNOSIS — Z59868 Other specified financial insecurity: Secondary | ICD-10-CM

## 2024-07-23 DIAGNOSIS — F251 Schizoaffective disorder, depressive type: Secondary | ICD-10-CM | POA: Diagnosis present

## 2024-07-23 DIAGNOSIS — F99 Mental disorder, not otherwise specified: Secondary | ICD-10-CM | POA: Diagnosis not present

## 2024-07-23 DIAGNOSIS — F1521 Other stimulant dependence, in remission: Secondary | ICD-10-CM | POA: Diagnosis present

## 2024-07-23 DIAGNOSIS — R7303 Prediabetes: Secondary | ICD-10-CM | POA: Diagnosis present

## 2024-07-23 DIAGNOSIS — F1590 Other stimulant use, unspecified, uncomplicated: Secondary | ICD-10-CM | POA: Diagnosis present

## 2024-07-23 DIAGNOSIS — Z5982 Transportation insecurity: Secondary | ICD-10-CM

## 2024-07-23 DIAGNOSIS — G4700 Insomnia, unspecified: Secondary | ICD-10-CM | POA: Diagnosis present

## 2024-07-23 DIAGNOSIS — Z811 Family history of alcohol abuse and dependence: Secondary | ICD-10-CM | POA: Diagnosis not present

## 2024-07-23 DIAGNOSIS — R45851 Suicidal ideations: Secondary | ICD-10-CM

## 2024-07-23 DIAGNOSIS — Z79899 Other long term (current) drug therapy: Secondary | ICD-10-CM | POA: Diagnosis not present

## 2024-07-23 DIAGNOSIS — F259 Schizoaffective disorder, unspecified: Secondary | ICD-10-CM | POA: Diagnosis present

## 2024-07-23 DIAGNOSIS — F1591 Other stimulant use, unspecified, in remission: Secondary | ICD-10-CM | POA: Diagnosis not present

## 2024-07-23 DIAGNOSIS — F250 Schizoaffective disorder, bipolar type: Secondary | ICD-10-CM | POA: Diagnosis present

## 2024-07-23 DIAGNOSIS — Z5901 Sheltered homelessness: Secondary | ICD-10-CM

## 2024-07-23 DIAGNOSIS — F17210 Nicotine dependence, cigarettes, uncomplicated: Secondary | ICD-10-CM | POA: Diagnosis present

## 2024-07-23 DIAGNOSIS — Z604 Social exclusion and rejection: Secondary | ICD-10-CM | POA: Diagnosis present

## 2024-07-23 MED ORDER — TRAZODONE HCL 50 MG PO TABS
50.0000 mg | ORAL_TABLET | Freq: Every evening | ORAL | Status: DC | PRN
  Administered 2024-07-23 – 2024-07-27 (×5): 50 mg via ORAL
  Filled 2024-07-23 (×4): qty 1
  Filled 2024-07-23: qty 7
  Filled 2024-07-23: qty 1

## 2024-07-23 MED ORDER — HALOPERIDOL LACTATE 5 MG/ML IJ SOLN: 5.0000 mg | Freq: Three times a day (TID) | INTRAMUSCULAR | Status: DC | PRN

## 2024-07-23 MED ORDER — STERILE WATER FOR INJECTION IJ SOLN
INTRAMUSCULAR | Status: AC
  Filled 2024-07-23: qty 10

## 2024-07-23 MED ORDER — DIPHENHYDRAMINE HCL 50 MG/ML IJ SOLN: 50.0000 mg | Freq: Three times a day (TID) | INTRAMUSCULAR | Status: DC | PRN

## 2024-07-23 MED ORDER — HYDROXYZINE HCL 25 MG PO TABS
25.0000 mg | ORAL_TABLET | Freq: Three times a day (TID) | ORAL | Status: DC | PRN
  Administered 2024-07-23 – 2024-07-27 (×6): 25 mg via ORAL
  Filled 2024-07-23 (×5): qty 1
  Filled 2024-07-23: qty 10
  Filled 2024-07-23: qty 1

## 2024-07-23 MED ORDER — HALOPERIDOL 5 MG PO TABS
5.0000 mg | ORAL_TABLET | Freq: Three times a day (TID) | ORAL | Status: DC | PRN
  Administered 2024-07-23 – 2024-07-25 (×4): 5 mg via ORAL
  Filled 2024-07-23 (×4): qty 1

## 2024-07-23 MED ORDER — ONDANSETRON HCL 4 MG PO TABS: 4.0000 mg | ORAL_TABLET | Freq: Three times a day (TID) | ORAL | Status: DC | PRN

## 2024-07-23 MED ORDER — NICOTINE 21 MG/24HR TD PT24
21.0000 mg | MEDICATED_PATCH | Freq: Every day | TRANSDERMAL | Status: DC
  Administered 2024-07-24 – 2024-07-28 (×5): 21 mg via TRANSDERMAL
  Filled 2024-07-23 (×5): qty 1

## 2024-07-23 MED ORDER — DIPHENHYDRAMINE HCL 25 MG PO CAPS
50.0000 mg | ORAL_CAPSULE | Freq: Three times a day (TID) | ORAL | Status: DC | PRN
  Administered 2024-07-23 – 2024-07-25 (×4): 50 mg via ORAL
  Filled 2024-07-23 (×4): qty 2

## 2024-07-23 MED ORDER — MAGNESIUM HYDROXIDE 400 MG/5ML PO SUSP
30.0000 mL | Freq: Every day | ORAL | Status: DC | PRN
  Administered 2024-07-24 – 2024-07-27 (×4): 30 mL via ORAL
  Filled 2024-07-23 (×4): qty 30

## 2024-07-23 MED ORDER — LORAZEPAM 2 MG/ML IJ SOLN: 2.0000 mg | Freq: Three times a day (TID) | INTRAMUSCULAR | Status: DC | PRN

## 2024-07-23 MED ORDER — HALOPERIDOL LACTATE 5 MG/ML IJ SOLN: 10.0000 mg | Freq: Three times a day (TID) | INTRAMUSCULAR | Status: DC | PRN

## 2024-07-23 MED ORDER — ALUM & MAG HYDROXIDE-SIMETH 200-200-20 MG/5ML PO SUSP: 30.0000 mL | ORAL | Status: DC | PRN

## 2024-07-23 MED ORDER — ACETAMINOPHEN 325 MG PO TABS: 650.0000 mg | ORAL_TABLET | Freq: Four times a day (QID) | ORAL | Status: DC | PRN

## 2024-07-23 NOTE — ED Notes (Signed)
 Pt provided with lunch tray.

## 2024-07-23 NOTE — Plan of Care (Signed)
  Problem: Activity: Goal: Interest or engagement in activities will improve Outcome: Progressing   Problem: Coping: Goal: Ability to demonstrate self-control will improve Outcome: Progressing   Problem: Safety: Goal: Periods of time without injury will increase Outcome: Progressing   Problem: Education: Goal: Emotional status will improve Outcome: Not Progressing Goal: Mental status will improve Outcome: Not Progressing

## 2024-07-23 NOTE — Progress Notes (Signed)
 Admission Note: Patient is a 48 years old male admitted to the unit voluntarily from [facility] for auditory hallucination, medication noncompliance, Insomnia and suicidal ideation with a plan to jump off the bridge.  Patient currently denies SI while in the hospital.  Stated he is here to get his medication adjusted.  Patient presents with anxious affect and mood.  Admission plan of care reviewed, consent for treatment signed.  Skin and personal belongings completed.  Items deemed contraband placed in the locker.  Patient oriented to the unit, staff and room.  Routine safety checks initiated.  Verbalizes understanding of unit rules/protocols.  Patient is safe on the unit.

## 2024-07-23 NOTE — Plan of Care (Signed)
   Problem: Education: Goal: Emotional status will improve Outcome: Not Progressing Goal: Mental status will improve Outcome: Not Progressing

## 2024-07-23 NOTE — ED Notes (Signed)
Pt to restroom, gait steady.

## 2024-07-23 NOTE — BH Assessment (Signed)
 This consult has been deferred to [facility] for assessment. Iris coordinator to update in secure chat when assessment time and provider are assigned.

## 2024-07-23 NOTE — ED Notes (Signed)
 Patient screaming in the hallway. Talking to things that are not present.

## 2024-07-23 NOTE — BH Assessment (Signed)
 At 2237, IRIS reached out with a time (2245 CST) for his assessment. Per [place] EMT the pt was given anxiety medications earlier for pacing and he's not willing to engage he's too tired. IRIS to follow up later.    At 0110 IRIS checked back in with [facility], EMT to see if the pt can engage. Per EMT, the pt is still not willing to engage, he's had his night meds but will let the chat know if the pt wakes and is able to engage.   Jackson JONETTA Broach, MS, [facility], [facility] Triage Specialist [phone]

## 2024-07-23 NOTE — ED Notes (Signed)
 Transport form signed by patient.   Belongings and paperwork handed to driver.

## 2024-07-23 NOTE — Progress Notes (Signed)
 Pt has been accepted to [facility] on 07/23/2024 . Bed assignment:504-1  Pt meets inpatient criteria per Nancyann Alert, MD   Attending Physician will be Dr. Prentis  Report can be called to: Adult unit: [phone]

## 2024-07-23 NOTE — ED Notes (Signed)
 X1 Gray backpack, x1 pt belonging bag placed in 1-4 [employer].

## 2024-07-23 NOTE — Consult Note (Signed)
 [facility] Consult Note  Patient Name: [name] [name] MRN: [id number] DOB: [birth year] DATE OF Consult: 07/23/2024  PRIMARY PSYCHIATRIC DIAGNOSES  1.  Schizoaffective disorder, depressive subtype 2.  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  Suicidal ideation  RECOMMENDATIONS  Recommendations:   Medication recommendations: quetiapine  50 mg BID +  Quetiapine  50 mg BID PRN for anxiety/agitation; both for schizoaffective d/o, patient states prior regimen was 200 mg once daily; can titrate fairly rapidly  Non-Medication/therapeutic recommendations: Psych admission  Is inpatient psychiatric hospitalization recommended for this patient? Yes (Explain why): SI, CAH  Follow-Up Telepsychiatry C/L services: We will sign off for now. Please re-consult our service if needed for any concerning changes in the patient's condition, discharge planning, or questions . Communication: Treatment team members (and family members if applicable) who were involved in treatment/care discussions and planning, and with whom we spoke or engaged with via secure text/chat, include the following: EDRN  Thank you for involving us  in the care of this patient. If you have any additional questions or concerns, please call [phone] and ask for me or the provider on-call.  TELEPSYCHIATRY ATTESTATION & CONSENT  As the provider for this telehealth consult, I attest that I verified the patient's identity using two separate identifiers, introduced myself to the patient, provided my credentials, disclosed my location, and performed this encounter via a HIPAA-compliant, real-time, face-to-face, two-way, interactive audio and video platform and with the full consent and agreement of the patient (or guardian as applicable.)  Patient physical location: ED at [facility]. Telehealth provider physical location: home office in state of [place] .  Video start time: 1105a CST (Central Time) Video end time: 1120a CST  (Central Time)  IDENTIFYING DATA  [name] [name] is a 48 y.o. year-old male for whom a psychiatric consultation has been ordered by the primary provider. The patient was identified using two separate identifiers.  CHIEF COMPLAINT/REASON FOR CONSULT   Suicidal ideation, hallucinations   HISTORY OF PRESENT ILLNESS (HPI)   The patient is a 48 year old man with reported history of schizoaffective disorder and insomnia; he presents to ED with c/o suicidal ideation with plan to jump off a bridge, the interruption of which to get help prompted his ER arrival; he reports being off his meds (Seroquel  200 mg once daily) for 1-2 months in context of recently moving to the area 1 month ago. He denies substance use. He reports prior h/o Psych admissions (reports last was a few years ago) and suicide attempts (overdose). He feels hopeless and does not feel he can be safe outside of hospital. He is aligned with inpatient treatment. He did not appear to be overtly RTIS but he endorses ongoing CAH to kill himself. Denies HI or thoughts to harm others. [name]  PAST PSYCHIATRIC HISTORY   He reports prior psych admissions, suicide attempts OP treatment: Yes, though compromised by his moving to the area one month ago Prior med trials: Yes History of abuse/trauma: denies  Otherwise as per HPI above.  PAST MEDICAL HISTORY  Past Medical History:  Diagnosis Date   Bipolar disorder (HCC)    Cellulitis    Depression    Drug abuse (HCC)    Hepatitis C      HOME MEDICATIONS  Facility Ordered Medications  Medication   risperiDONE (RISPERDAL M-TABS) disintegrating tablet 2 mg   And   [COMPLETED] LORazepam  (ATIVAN ) tablet 1 mg   And   ziprasidone  (GEODON ) injection 20 mg   acetaminophen  (TYLENOL ) tablet 650 mg  zolpidem  (AMBIEN ) tablet 5 mg   ondansetron  (ZOFRAN ) tablet 4 mg   alum & mag hydroxide-simeth (MAALOX/MYLANTA) 200-200-20 MG/5ML suspension 30 mL   nicotine  (NICODERM CQ  - dosed in mg/24 hours) patch 21 mg    sterile water  (preservative free) injection   PTA Medications  Medication Sig   Multiple Vitamin (MULTIVITAMIN WITH MINERALS) TABS tablet Take 1 tablet by mouth daily. (Patient not taking: Reported on 10/05/2021)   thiamine  100 MG tablet Take 1 tablet (100 mg total) by mouth daily. (Patient not taking: Reported on 10/05/2021)   gabapentin  (NEURONTIN ) 300 MG capsule Take 1 capsule (300 mg total) by mouth 3 (three) times daily. 7 days supply for alcohol  withdrawals (Patient not taking: Reported on 10/05/2021)   cloNIDine  (CATAPRES ) 0.1 MG tablet Take 1 tablet (0.1 mg total) by mouth 2 (two) times daily. (Patient not taking: Reported on 07/23/2024)   nicotine  (NICODERM CQ  - DOSED IN MG/24 HOURS) 21 mg/24hr patch Place 1 patch (21 mg total) onto the skin daily. (Patient not taking: Reported on 07/23/2024)   sertraline  (ZOLOFT ) 100 MG tablet Take 1 tablet (100 mg total) by mouth daily. (Patient not taking: Reported on 07/23/2024)   traZODone  (DESYREL ) 100 MG tablet Take 1 tablet (100 mg total) by mouth at bedtime as needed for sleep. (Patient not taking: Reported on 07/23/2024)   OLANZapine  (ZYPREXA ) 10 MG tablet Take 10 mg by mouth at bedtime. (Patient not taking: Reported on 07/23/2024)     ALLERGIES  Allergies  Allergen Reactions   Lithium Anaphylaxis   Penicillins Rash    Has patient had a PCN reaction causing immediate rash, facial/tongue/throat swelling, SOB or lightheadedness with hypotension: Yes Has patient had a PCN reaction causing severe rash involving mucus membranes or skin necrosis: No Has patient had a PCN reaction that required hospitalization: No Has patient had a PCN reaction occurring within the last 10 years: No If all of the above answers are NO, then may proceed with Cephalosporin use.     SOCIAL & SUBSTANCE USE HISTORY  Social History   Socioeconomic History   Marital status: Divorced    Spouse name: Not on file   Number of children: 2   Years of education: Not on  file   Highest education level: GED or equivalent  Occupational History   Not on file  Tobacco Use   Smoking status: Every Day    Current packs/day: 1.00    Average packs/day: 1 pack/day for 42.8 years (42.8 ttl pk-yrs)    Types: Cigarettes    Start date: 10/22/1981   Smokeless tobacco: Never  Vaping Use   Vaping status: Never Used  Substance and Sexual Activity   Alcohol  use: Yes    Comment: 6-7 bottles of wine per day   Drug use: Not Currently    Types: Marijuana, Benzodiazepines, Other-see comments, Methamphetamines, Cocaine    Comment: denies   Sexual activity: Not Currently  Other Topics Concern   Not on file  Social History Narrative   Not on file   Social Drivers of Health   Financial Resource Strain: High Risk (04/10/2023)   Received from [facility]   Overall Financial Resource Strain (CARDIA)    Difficulty of Paying Living Expenses: Hard  Food Insecurity: Food Insecurity Present (04/22/2024)   Received from [facility]   Hunger Vital Sign    Within the past 12 months, you worried that your food would run out before you got the money to buy more.: Often true  Within the past 12 months, the food you bought just didn't last and you didn't have money to get more.: Often true  Transportation Needs: Unmet Transportation Needs (04/29/2024)   Received from [facility]   PRAPARE - Transportation    Lack of Transportation (Medical): Yes    Lack of Transportation (Non-Medical): Yes  Physical Activity: Inactive (04/10/2023)   Received from [facility]   Exercise Vital Sign    On average, how many days per week do you engage in moderate to strenuous exercise (like a brisk walk)?: 0 days    On average, how many minutes do you engage in exercise at this level?: 0 min  Stress: Stress Concern Present (04/22/2024)   Received from [facility] - Occupational Stress Questionnaire    Feeling of Stress : Rather  much  Social Connections: Socially Isolated (04/10/2023)   Received from [facility]   Social Connection and Isolation Panel    In a typical week, how many times do you talk on the phone with family, friends, or neighbors?: Never    How often do you get together with friends or relatives?: Never    How often do you attend church or religious services?: 1 to 4 times per year    Do you belong to any clubs or organizations such as church groups, unions, fraternal or athletic groups, or school groups?: No    How often do you attend meetings of the clubs or organizations you belong to?: Never    Are you married, widowed, divorced, separated, never married, or living with a partner?: Divorced   Social History   Tobacco Use  Smoking Status Every Day   Current packs/day: 1.00   Average packs/day: 1 pack/day for 42.8 years (42.8 ttl pk-yrs)   Types: Cigarettes   Start date: 10/22/1981  Smokeless Tobacco Never   Social History   Substance and Sexual Activity  Alcohol  Use Yes   Comment: 6-7 bottles of wine per day   Social History   Substance and Sexual Activity  Drug Use Not Currently   Types: Marijuana, Benzodiazepines, Other-see comments, Methamphetamines, Cocaine   Comment: denies    Additional pertinent information: as above.  FAMILY HISTORY  Family History  Problem Relation Age of Onset   Alcohol  abuse Father    Cancer Maternal Aunt    Family Psychiatric History (if known):  paternal FHX of alcohol  abuse  MENTAL STATUS EXAM (MSE)  Mental Status Exam: General Appearance: hospital attire; slightly disheveled  Orientation:  Full (Time, Place, and Person)  Memory:  fair  Concentration:  fair  Recall:  fair  Attention  Fair  Eye Contact:  good  Speech:  Normal Rate  Language:  fluent English  Volume:  normal  Mood: I was suicidal, hearing voices. - patient  Affect:  constricted  Thought Process:  Coherent and Linear  Thought Content:  hallucinations  (auditory and visual)  Suicidal Thoughts:  Yes.  with intent/plan  Homicidal Thoughts:  No  Judgement:  Impaired  Insight:  Fair  Psychomotor Activity:  Normal  Akathisia:  No  Fund of Knowledge:  Fair     VITALS  Blood pressure 116/89, pulse 86, temperature 97.6 F (36.4 C), temperature source Oral, resp. rate 16, SpO2 98%.  LABS  Admission on 07/22/2024  Component Date Value Ref Range Status   Sodium 07/22/2024 142  135 - 145 mmol/L Final   Potassium 07/22/2024 4.2  3.5 -  5.1 mmol/L Final   Chloride 07/22/2024 104  98 - 111 mmol/L Final   CO2 07/22/2024 26  22 - 32 mmol/L Final   Glucose, Bld 07/22/2024 84  70 - 99 mg/dL Final   Glucose reference range applies only to samples taken after fasting for at least 8 hours.   BUN 07/22/2024 19  6 - 20 mg/dL Final   Creatinine, Ser 07/22/2024 0.67  0.61 - 1.24 mg/dL Final   Calcium 89/98/7974 9.7  8.9 - 10.3 mg/dL Final   Total Protein 89/98/7974 7.4  6.5 - 8.1 g/dL Final   Albumin 89/98/7974 4.5  3.5 - 5.0 g/dL Final   AST 89/98/7974 25  15 - 41 U/L Final   ALT 07/22/2024 20  0 - 44 U/L Final   Alkaline Phosphatase 07/22/2024 84  38 - 126 U/L Final   Total Bilirubin 07/22/2024 0.4  0.0 - 1.2 mg/dL Final   GFR, Estimated 07/22/2024 >60  >60 mL/min Final   Comment: (NOTE) Calculated using the CKD-EPI Creatinine Equation (2021)    Anion gap 07/22/2024 12  5 - 15 Final   Performed at [facility], [address], [place], [place] [address]   Alcohol , Ethyl (B) 07/22/2024 <15  <15 mg/dL Final   Comment: (NOTE) For medical purposes only. Performed at [facility], [address] [place], [place] [address]    WBC 07/22/2024 10.7 (H)  4.0 - 10.5 K/uL Final   RBC 07/22/2024 4.75  4.22 - 5.81 MIL/uL Final   Hemoglobin 07/22/2024 14.6  13.0 - 17.0 g/dL Final   HCT 89/98/7974 45.5  39.0 - 52.0 % Final   MCV 07/22/2024 95.8  80.0 - 100.0 fL Final   MCH 07/22/2024 30.7  26.0 - 34.0 pg Final   MCHC  07/22/2024 32.1  30.0 - 36.0 g/dL Final   RDW 89/98/7974 12.7  11.5 - 15.5 % Final   Platelets 07/22/2024 371  150 - 400 K/uL Final   nRBC 07/22/2024 0.0  0.0 - 0.2 % Final   Performed at [facility], [address] [place], [place] [address]   Opiates 07/22/2024 NEGATIVE  NEGATIVE Final   Cocaine 07/22/2024 NEGATIVE  NEGATIVE Final   Benzodiazepines 07/22/2024 NEGATIVE  NEGATIVE Final   Amphetamines 07/22/2024 NEGATIVE  NEGATIVE Final   Tetrahydrocannabinol 07/22/2024 NEGATIVE  NEGATIVE Final   Barbiturates 07/22/2024 NEGATIVE  NEGATIVE Final   Methadone Scn, Ur 07/22/2024 NEGATIVE  NEGATIVE Final   Fentanyl 07/22/2024 NEGATIVE  NEGATIVE Final   Comment: (NOTE) Drug screen is for Medical Purposes only. Positive results are preliminary only. If confirmation is needed, notify lab within 5 days.  Drug Class                 Cutoff (ng/mL) Amphetamine and metabolites 1000 Barbiturate and metabolites 200 Benzodiazepine              200 Opiates and metabolites     300 Cocaine and metabolites     300 THC                         50 Fentanyl                    5 Methadone                   300  Trazodone  is metabolized in vivo to several metabolites,  including pharmacologically active m-CPP, which is excreted  in the  urine.  Immunoassay screens for amphetamines and MDMA have potential  cross-reactivity with these compounds and may provide false positive  result.  Performed at [facility], [address], [place], [place] [address]     PSYCHIATRIC REVIEW OF SYSTEMS (ROS)  ROS: Notable for the following relevant positive findings: ROS - depression, insomnia, suicidal ideation, hallucinations, worthlessness, guilt/shame, reduced energy  Additional findings:      Musculoskeletal: No abnormal movements observed      Gait & Station: Normal      Pain Screening: Denies      Nutrition & Dental Concerns: Decrease in food intake and/or loss of  appetite  RISK FORMULATION/ASSESSMENT  Is the patient experiencing any suicidal or homicidal ideations: Yes       Explain if yes: SI with plan, CAH Protective factors considered for safety management: prior treatment response; help-seeking  Risk factors/concerns considered for safety management:  Prior attempt Depression Barriers to accessing treatment Male gender  Is there a safety management plan with the patient and treatment team to minimize risk factors and promote protective factors: Yes           Explain: meds; psych admission Is crisis care placement or psychiatric hospitalization recommended: Yes     Based on my current evaluation and risk assessment, patient is determined at this time to be at:  High risk  *RISK ASSESSMENT Risk assessment is a dynamic process; it is possible that this patient's condition, and risk level, may change. This should be re-evaluated and managed over time as appropriate. Please re-consult psychiatric consult services if additional assistance is needed in terms of risk assessment and management. If your team decides to discharge this patient, please advise the patient how to best access emergency psychiatric services, or to call 911, if their condition worsens or they feel unsafe in any way.   Nancyann LITTIE Alert, MD Telepsychiatry Consult Services

## 2024-07-23 NOTE — Progress Notes (Signed)
(  Sleep Hours) -9  (Any PRNs that were needed, meds refused, or side effects to meds)- Vistaril  25 mg , Trazodone  50 mg  (Any disturbances and when (visitation, over night)-Pt appearing with hypomanic behaviors, pt very restless and appearing to be responding to internal stimuli  (Concerns raised by the patient)- insomnia , Ask me 3 discussed with pt with verbal understanding  (SI/HI/AVH)-AH

## 2024-07-23 NOTE — Tx Team (Signed)
 Initial Treatment Plan 07/23/2024 4:29 PM [name] [name] [id number]    PATIENT STRESSORS: Health problems   Medication change or noncompliance     PATIENT STRENGTHS: Motivation for treatment/growth  Supportive family/friends    PATIENT IDENTIFIED PROBLEMS: To get my medication adjusted  Paranoia  Medication noncompliance  Auditory hallucination  Anxiety  Suicidal thoughts  Ineffective coping skills         DISCHARGE CRITERIA:  Ability to meet basic life and health needs Adequate post-discharge living arrangements Motivation to continue treatment in a less acute level of care  PRELIMINARY DISCHARGE PLAN: Attend aftercare/continuing care group Outpatient therapy Return to previous living arrangement  PATIENT/FAMILY INVOLVEMENT: This treatment plan has been presented to and reviewed with the patient, [name], and/or family member.  The patient and family have been given the opportunity to ask questions and make suggestions.  Almarie MALVA Lowers, RN 07/23/2024, 4:29 PM

## 2024-07-23 NOTE — ED Notes (Signed)
 Patient said dont give me geodon  that is going to kill me.

## 2024-07-23 NOTE — ED Provider Notes (Signed)
 Emergency Medicine Observation Re-evaluation Note  [name] [name] is a 48 y.o. male, seen on rounds today.  Pt initially presented to the ED for complaints of Psychiatric Evaluation and Suicidal Currently, the patient is awaiting psychiatric disposition.  Physical Exam  BP 116/89 (BP Location: Left Arm)   Pulse 86   Temp 97.6 F (36.4 C) (Oral)   Resp 16   SpO2 98%  Physical Exam General: NAD Cardiac: RR Lungs: even unlabored   ED Course / MDM  EKG:   I have reviewed the labs performed to date as well as medications administered while in observation.  Recent changes in the last 24 hours include arrival, received anxiety medications for pacing.  THis AM, talking to himself. Did begin screaming in the hallway, was given geodon .  Plan  Current plan is to await psychiatric disposition.    Dreama Longs, MD 07/23/24 1500

## 2024-07-23 NOTE — Progress Notes (Signed)
 Pt pacing the hallway, pt appearing to be responding.

## 2024-07-24 ENCOUNTER — Encounter (HOSPITAL_COMMUNITY): Payer: Self-pay

## 2024-07-24 MED ORDER — QUETIAPINE FUMARATE 50 MG PO TABS
150.0000 mg | ORAL_TABLET | Freq: Every day | ORAL | Status: DC
  Administered 2024-07-24: 150 mg via ORAL
  Filled 2024-07-24: qty 1

## 2024-07-24 MED ORDER — ADULT MULTIVITAMIN W/MINERALS CH
1.0000 | ORAL_TABLET | Freq: Every day | ORAL | Status: DC
  Administered 2024-07-25 – 2024-07-28 (×4): 1 via ORAL
  Filled 2024-07-24 (×4): qty 1

## 2024-07-24 MED ORDER — LORAZEPAM 1 MG PO TABS: 1.0000 mg | ORAL_TABLET | Freq: Four times a day (QID) | ORAL | Status: AC | PRN

## 2024-07-24 MED ORDER — QUETIAPINE FUMARATE 50 MG PO TABS
50.0000 mg | ORAL_TABLET | Freq: Every day | ORAL | Status: DC
  Administered 2024-07-24 – 2024-07-25 (×2): 50 mg via ORAL
  Filled 2024-07-24 (×2): qty 1

## 2024-07-24 MED ORDER — VITAMIN B-1 100 MG PO TABS
100.0000 mg | ORAL_TABLET | Freq: Every day | ORAL | Status: DC
  Administered 2024-07-25 – 2024-07-28 (×4): 100 mg via ORAL
  Filled 2024-07-24 (×4): qty 1

## 2024-07-24 NOTE — [facility] Group Notes (Signed)
 [facility] Group Notes:  (Nursing/MHT/Case Management/Adjunct)  Date:  07/24/2024  Time:  3:44 AM  Type of Therapy:  Wrap-up group  Participation Level:  Minimal  Participation Quality:  Appropriate  Affect:  Anxious  Cognitive:  Appropriate  Insight:  Appropriate  Engagement in Group:  Engaged  Modes of Intervention:  Education  Summary of Progress/Problems: PT new admission. No goal.  [name] [name] [place] 07/24/2024, 3:44 AM

## 2024-07-24 NOTE — Progress Notes (Signed)
 Pt up responding to internal stimuli , pt had to be brought back from breakfast. Pt continued to talk loudly to people not seen by staff, pt given PRN Haldol  and Benadryl  per [facility].

## 2024-07-24 NOTE — BH IP Treatment Plan (Signed)
 Interdisciplinary Treatment and Diagnostic Plan Update  07/24/2024 Time of Session: 10:25 AM [name] [name] MRN: [id number]  Principal Diagnosis: Schizoaffective disorder [facility])  Secondary Diagnoses: Principal Problem:   Schizoaffective disorder (HCC)   Current Medications:  Current Facility-Administered Medications  Medication Dose Route Frequency Provider Last Rate Last Admin   acetaminophen  (TYLENOL ) tablet 650 mg  650 mg Oral Q6H PRN Brent, Amanda C, NP       alum & mag hydroxide-simeth (MAALOX/MYLANTA) 200-200-20 MG/5ML suspension 30 mL  30 mL Oral Q4H PRN Brent, Amanda C, NP       haloperidol  (HALDOL ) tablet 5 mg  5 mg Oral TID PRN Brent, Amanda C, NP   5 mg at 07/24/24 1009   And   diphenhydrAMINE  (BENADRYL ) capsule 50 mg  50 mg Oral TID PRN Brent, Amanda C, NP   50 mg at 07/24/24 1008   haloperidol  lactate (HALDOL ) injection 5 mg  5 mg Intramuscular TID PRN Brent, Amanda C, NP       And   diphenhydrAMINE  (BENADRYL ) injection 50 mg  50 mg Intramuscular TID PRN Brent, Amanda C, NP       And   LORazepam  (ATIVAN ) injection 2 mg  2 mg Intramuscular TID PRN Brent, Amanda C, NP       haloperidol  lactate (HALDOL ) injection 10 mg  10 mg Intramuscular TID PRN Thresa Alan BROCKS, NP       And   diphenhydrAMINE  (BENADRYL ) injection 50 mg  50 mg Intramuscular TID PRN Brent, Amanda C, NP       And   LORazepam  (ATIVAN ) injection 2 mg  2 mg Intramuscular TID PRN Brent, Amanda C, NP       hydrOXYzine  (ATARAX ) tablet 25 mg  25 mg Oral TID PRN Brent, Amanda C, NP   25 mg at 07/23/24 2022   magnesium  hydroxide (MILK OF MAGNESIA) suspension 30 mL  30 mL Oral Daily PRN Brent, Amanda C, NP       nicotine  (NICODERM CQ  - dosed in mg/24 hours) patch 21 mg  21 mg Transdermal Daily Brent, Amanda C, NP   21 mg at 07/24/24 9192   ondansetron  (ZOFRAN ) tablet 4 mg  4 mg Oral Q8H PRN Brent, Amanda C, NP       QUEtiapine  (SEROQUEL ) tablet 150 mg  150 mg Oral QHS McCarty, Artie, MD       QUEtiapine  (SEROQUEL )  tablet 50 mg  50 mg Oral Daily McCarty, Artie, MD   50 mg at 07/24/24 1240   traZODone  (DESYREL ) tablet 50 mg  50 mg Oral QHS PRN Brent, Amanda C, NP   50 mg at 07/23/24 2022   PTA Medications: Medications Prior to Admission  Medication Sig Dispense Refill Last Dose/Taking   cloNIDine  (CATAPRES ) 0.1 MG tablet Take 1 tablet (0.1 mg total) by mouth 2 (two) times daily. (Patient not taking: Reported on 07/23/2024) 60 tablet 0    gabapentin  (NEURONTIN ) 300 MG capsule Take 1 capsule (300 mg total) by mouth 3 (three) times daily. 7 days supply for alcohol  withdrawals (Patient not taking: Reported on 10/05/2021) 21 capsule 0    Multiple Vitamin (MULTIVITAMIN WITH MINERALS) TABS tablet Take 1 tablet by mouth daily. (Patient not taking: Reported on 10/05/2021)      nicotine  (NICODERM CQ  - DOSED IN MG/24 HOURS) 21 mg/24hr patch Place 1 patch (21 mg total) onto the skin daily. (Patient not taking: Reported on 07/23/2024) 28 patch 0    OLANZapine  (ZYPREXA ) 10 MG tablet Take 10 mg  by mouth at bedtime. (Patient not taking: Reported on 07/23/2024)      sertraline  (ZOLOFT ) 100 MG tablet Take 1 tablet (100 mg total) by mouth daily. (Patient not taking: Reported on 07/23/2024) 30 tablet 0    thiamine  100 MG tablet Take 1 tablet (100 mg total) by mouth daily. (Patient not taking: Reported on 10/05/2021)      traZODone  (DESYREL ) 100 MG tablet Take 1 tablet (100 mg total) by mouth at bedtime as needed for sleep. (Patient not taking: Reported on 07/23/2024) 30 tablet 0     Patient Stressors: Health problems   Medication change or noncompliance    Patient Strengths: Motivation for treatment/growth  Supportive family/friends   Treatment Modalities: Medication Management, Group therapy, Case management,  1 to 1 session with clinician, Psychoeducation, Recreational therapy.   Physician Treatment Plan for Primary Diagnosis: Schizoaffective disorder (HCC) Long Term Goal(s):     Short Term Goals:    Medication  Management: Evaluate patient's response, side effects, and tolerance of medication regimen.  Therapeutic Interventions: 1 to 1 sessions, Unit Group sessions and Medication administration.  Evaluation of Outcomes: Not Progressing  Physician Treatment Plan for Secondary Diagnosis: Principal Problem:   Schizoaffective disorder (HCC)  Long Term Goal(s):     Short Term Goals:       Medication Management: Evaluate patient's response, side effects, and tolerance of medication regimen.  Therapeutic Interventions: 1 to 1 sessions, Unit Group sessions and Medication administration.  Evaluation of Outcomes: Not Progressing   RN Treatment Plan for Primary Diagnosis: Schizoaffective disorder (HCC) Long Term Goal(s): Knowledge of disease and therapeutic regimen to maintain health will improve  Short Term Goals: Ability to remain free from injury will improve, Ability to verbalize frustration and anger appropriately will improve, Ability to demonstrate self-control, Ability to participate in decision making will improve, Ability to verbalize feelings will improve, Ability to disclose and discuss suicidal ideas, Ability to identify and develop effective coping behaviors will improve, and Compliance with prescribed medications will improve  Medication Management: RN will administer medications as ordered by provider, will assess and evaluate patient's response and provide education to patient for prescribed medication. RN will report any adverse and/or side effects to prescribing provider.  Therapeutic Interventions: 1 on 1 counseling sessions, Psychoeducation, Medication administration, Evaluate responses to treatment, Monitor vital signs and CBGs as ordered, Perform/monitor CIWA, COWS, AIMS and Fall Risk screenings as ordered, Perform wound care treatments as ordered.  Evaluation of Outcomes: Not Progressing   LCSW Treatment Plan for Primary Diagnosis: Schizoaffective disorder (HCC) Long Term  Goal(s): Safe transition to appropriate next level of care at discharge, Engage patient in therapeutic group addressing interpersonal concerns.  Short Term Goals: Engage patient in aftercare planning with referrals and resources, Increase social support, Increase ability to appropriately verbalize feelings, Increase emotional regulation, Facilitate acceptance of mental health diagnosis and concerns, Facilitate patient progression through stages of change regarding substance use diagnoses and concerns, Identify triggers associated with mental health/substance abuse issues, and Increase skills for wellness and recovery  Therapeutic Interventions: Assess for all discharge needs, 1 to 1 time with Social worker, Explore available resources and support systems, Assess for adequacy in community support network, Educate family and significant other(s) on suicide prevention, Complete Psychosocial Assessment, Interpersonal group therapy.  Evaluation of Outcomes: Not Progressing   Progress in Treatment: Attending groups: attended one group Participating in groups: Yes Taking medication as prescribed: Yes. Toleration medication: Yes. Family/Significant other contact made: No, will contact:  consents are pending Patient  understands diagnosis: No. Discussing patient identified problems/goals with staff: No. Medical problems stabilized or resolved: Yes. Denies suicidal/homicidal ideation: Yes. Issues/concerns per patient self-inventory: No.  New problem(s) identified:  No  New Short Term/Long Term Goal(s):    medication stabilization, elimination of SI thoughts, development of comprehensive mental wellness plan.    Patient Goals:  I want to have my medication regulated and then be discharged from the hospital.  Discharge Plan or Barriers:  Patient recently admitted. CSW will continue to follow and assess for appropriate referrals and possible discharge planning.    Reason for Continuation of  Hospitalization: Anxiety Depression Hallucinations Medication stabilization Suicidal ideation  Estimated Length of Stay:  5 - 7 days  Last 3 [place] Suicide Severity Risk Score: Flowsheet Row [facility] from 07/22/2024 in [facility] (Discharged[facility]  C-SSRS RISK CATEGORY Error: Q7 should not be populated when Q6 is No High Risk High Risk    Last PHQ 2/9 Scores:    10/05/2021    2:29 AM 09/21/2021   12:04 PM 07/01/2017    8:05 AM  Depression screen PHQ 2/9  Decreased Interest 2 2 3   Down, Depressed, Hopeless 2 3 3   PHQ - 2 Score 4 5 6   Altered sleeping 0 3 2  Tired, decreased energy 0 2 2  Change in appetite 0 3 3  Feeling bad or failure about yourself  2 2 2   Trouble concentrating 0 2 2  Moving slowly or fidgety/restless 1 2 2   Suicidal thoughts 2 3   PHQ-9 Score 9 22 19   Difficult doing work/chores Extremely dIfficult  Not difficult at all    Scribe for Treatment Team: Hildegard Hlavac O Claudett Bayly, LCSWA 07/24/2024 2:32 PM

## 2024-07-24 NOTE — Progress Notes (Signed)
(  Sleep Hours) -13  (Any PRNs that were needed, meds refused, or side effects to meds)- Vistaril  25 mg , Trazodone  50 mg  (Any disturbances and when (visitation, over night)- none  (Concerns raised by the patient)- none  (SI/HI/AVH)-denies

## 2024-07-24 NOTE — Group Note (Signed)
 Date:  07/24/2024 Time:  8:23 PM  Group Topic/Focus:  Wrap-Up Group:   The focus of this group is to help patients review their daily goal of treatment and discuss progress on daily workbooks.    Participation Level:  Active  Participation Quality:  Appropriate  Affect:  Appropriate  Cognitive:  Appropriate  Insight: Appropriate  Engagement in Group:  Engaged  Modes of Intervention:  Education and Exploration  Additional Comments:  Patient attended and participated in group tonight. He reports that today he spoke with his doctor, took his medication and went for meals.  Gwenn Chillington Dacosta 07/24/2024, 8:23 PM

## 2024-07-24 NOTE — Group Note (Signed)
 Recreation Therapy Group Note   Group Topic:Team Building  Group Date: 07/24/2024 Start Time: 1020 End Time: 1050 Facilitators: Sharelle Burditt-McCall, LRT,CTRS Location: [address] Dayroom   Group Topic: Communication, Team Building, Problem Solving  Goal Area(s) Addresses:  Patient will effectively work with peer towards shared goal.  Patient will identify skills used to make activity successful.  Patient will identify how skills used during activity can be used to reach post d/c goals.   Behavioral Response:   Intervention: STEM Activity  Activity: Straw Bridge. In teams of 3-5, patients were given 15 plastic drinking straws and an equal length of masking tape. Using the materials provided, patients were instructed to build a free standing bridge-like structure to suspend an everyday item (ex: puzzle box) off of the floor or table surface. All materials were required to be used by the team in their design. LRT facilitated post-activity discussion reviewing team process. Patients were encouraged to reflect how the skills used in this activity can be generalized to daily life post discharge.   Education: Pharmacist, community, Scientist, physiological, Discharge Planning   Education Outcome: Acknowledges education/In group clarification offered/Needs additional education.    Affect/Mood: N/A   Participation Level: Did not attend    Clinical Observations/Individualized Feedback:     Plan: Continue to engage patient in RT group sessions 2-3x/week.   Zoila Ditullio-McCall, LRT,CTRS 07/24/2024 12:39 PM

## 2024-07-24 NOTE — H&P (Addendum)
 Psychiatric Admission Assessment Adult  Patient Identification: [name] [name] MRN:  [id number] Date of Evaluation:  07/24/2024 Chief Complaint:  Schizoaffective disorder (HCC) [F25.9] Principal Diagnosis: Schizoaffective disorder (HCC) Diagnosis:  Principal Problem:   Schizoaffective disorder (HCC)    CC: hearing voices and cannot sleep  [name] [name] is a 48 y.o. male  with a past psychiatric history of numerous diagnosis including schizoaffective, extensive hospitalizations over last several years including July 2025, no suicide attempts. Patient initially arrived to [facility] on 07/22/2024 for auditory hallucinations, and admitted to [facility] on 07/23/2024 for intensive therapeutic interventions and stabilization of acute on chronic psychiatric conditions.    HPI:  Patient reports he is been experiencing homelessness and [place]. Says that he was hospitalized and suppose to follow up in the [place] area when he was unable to locate this provide. Since that point about 2 months ago, he reports that he has not been on his medications. Says that he was on seroquel  (per chart might have been zyprexa ). He was insistent that he responds better to seroquel . In the last week or two he moved back to [place] where he knows well. He reports that he has been having worsening auditory hallucinations that have been worsening his general functioning and his sleep. Says that he knows the area and can follow up with [facility] if they are stilll downtown. Says that when his sleep started getting messed up, he went to the emergency department. He is interested in restarting medication. He is open to the idea of ACT team.    Collateral Information: reports that his sister is local and support but declines to have her called or others.    Past Psychiatric Hx: Current Psychiatrist: none currently, was suppose to follow up with [facility] in [place] after hosp in July.  Previously has been to [facility] in [place].  Current Therapist: none Previous Psychiatric Diagnoses: extensive diagnoses in the chart including numerous mood, psychotic, substance. Per pt schizoaffective disorder (type unknown).   Psychiatric Medications: Current Seroquel  per patient. Doesn't know dose Per pharm records, zyprexa  10 mg at bedtime Past Zyprexa  helps sleep but not AH Seroquel  helps sleep and AH  Psychiatric Hospitalization hx: numerous including several this year. Last hospitalization was in July at [facility].  ACT team hx: no history but interested Neuromodulation history: n/a  History of suicide: no attempts but extensive SI per chart History of homicide or aggression: denies  Substance Use Hx: Alcohol : 3 beers once a week (each beer is a tallboy twisted tea though) Tobacco: vaping and cigarettes daily Cannabis: few hits a week Other Substances: substantial hx of meth, cocaine, opoid use. No use in the last several years.  Non-prescribed medications: n/a Rehab History: yes but not in the last several years  Past Medical History: PCP: none currently Medical Dx: none per patient Meds: none per patient Allergies: n/a Hospitalizations: none, numerous ED visit though Surgeries: n/a TBI: denies Seizures: denies  Family History: family history includes Alcohol  abuse in his father; Cancer in his maternal aunt. No primary psychotic disorder in family. Mother has anxiety.   Social History: Developmental: did not discuss Current Living Situation: bounces between [place] and [place] and other cities.  Education: did not discuss Occupation: been on disability since early 77s, also on medicare Hobbies: exercise particularly walking Spirituality/religiosity: did not discuss Marital Status / Relationships: not in relationship currently Children: did not discuss Military: denies  Legal: denies any upcoming court  dates.  Access to firearms: denies   Total  Time spent with patient: 1 hour   [place] Scale:  Flowsheet Row [facility] ([facility] from 07[facility]22/2024 in [facility] Department at [facility] Admission (Discharged) [facility]  C-SSRS RISK CATEGORY Error: Q7 should not be populated when Q6 is No High Risk High Risk     Lab Results:  Results for orders placed or performed during the hospital encounter of 07/22/24 (from the past 48 hours)  Rapid urine drug screen (hospital performed)     Status: None   Collection Time: 07/22/24  6:22 PM  Result Value Ref Range   Opiates NEGATIVE NEGATIVE   Cocaine NEGATIVE NEGATIVE   Benzodiazepines NEGATIVE NEGATIVE   Amphetamines NEGATIVE NEGATIVE   Tetrahydrocannabinol NEGATIVE NEGATIVE   Barbiturates NEGATIVE NEGATIVE   Methadone Scn, Ur NEGATIVE NEGATIVE   Fentanyl NEGATIVE NEGATIVE    Comment: (NOTE) Drug screen is for Medical Purposes only. Positive results are preliminary only. If confirmation is needed, notify lab within 5 days.  Drug Class                 Cutoff (ng/mL) Amphetamine and metabolites 1000 Barbiturate and metabolites 200 Benzodiazepine              200 Opiates and metabolites     300 Cocaine and metabolites     300 THC                         50 Fentanyl                    5 Methadone                   300  Trazodone  is metabolized in vivo to several metabolites,  including pharmacologically active m-CPP, which is excreted in the  urine.  Immunoassay screens for amphetamines and MDMA have potential  cross-reactivity with these compounds and may provide false positive  result.  Performed at [facility], [address] [place], [place] [address]   Comprehensive metabolic panel     Status: None   Collection Time: 07/22/24  6:36 PM  Result Value Ref Range   Sodium 142 135 - 145 mmol/L   Potassium 4.2 3.5 - 5.1  mmol/L   Chloride 104 98 - 111 mmol/L   CO2 26 22 - 32 mmol/L   Glucose, Bld 84 70 - 99 mg/dL    Comment: Glucose reference range applies only to samples taken after fasting for at least 8 hours.   BUN 19 6 - 20 mg/dL   Creatinine, Ser 9.32 0.61 - 1.24 mg/dL   Calcium 9.7 8.9 - 89.6 mg/dL   Total Protein 7.4 6.5 - 8.1 g/dL   Albumin 4.5 3.5 - 5.0 g/dL   AST 25 15 - 41 U/L   ALT 20 0 - 44 U/L   Alkaline Phosphatase 84 38 - 126 U/L   Total Bilirubin 0.4 0.0 - 1.2 mg/dL   GFR, Estimated >39 >39 mL/min    Comment: (NOTE) Calculated using the CKD-EPI Creatinine Equation (2021)    Anion gap 12 5 - 15    Comment: Performed at [facility], [address] [place], [place] [address]  Ethanol     Status: None   Collection Time: 07/22/24  6:36 PM  Result  Value Ref Range   Alcohol , Ethyl (B) <15 <15 mg/dL    Comment: (NOTE) For medical purposes only. Performed at [facility], [address] [place], [place] [address]   cbc     Status: Abnormal   Collection Time: 07/22/24  6:36 PM  Result Value Ref Range   WBC 10.7 (H) 4.0 - 10.5 K/uL   RBC 4.75 4.22 - 5.81 MIL/uL   Hemoglobin 14.6 13.0 - 17.0 g/dL   HCT 54.4 60.9 - 47.9 %   MCV 95.8 80.0 - 100.0 fL   MCH 30.7 26.0 - 34.0 pg   MCHC 32.1 30.0 - 36.0 g/dL   RDW 87.2 88.4 - 84.4 %   Platelets 371 150 - 400 K/uL   nRBC 0.0 0.0 - 0.2 %    Comment: Performed at [facility], [address], [place], [place] [address]    Blood Alcohol  level:  Lab Results  Component Value Date   [facility] <15 07/22/2024   ETH 199 (H) 10/05/2021    Metabolic Disorder Labs:  See assessment and plan.      Psychiatric Specialty Exam:  Presentation  General Appearance: -- (unkept but showered.)  Eye Contact:Good  Speech:Normal Rate  Speech Volume:Normal  Handedness:No data recorded  Mood and Affect  Mood:Euthymic  Affect:Appropriate   Thought Process  Thought  Processes:Coherent  Descriptions of Associations:Intact  Orientation:Full (Time, Place and Person)  Thought Content:Logical  History of Schizophrenia/Schizoaffective disorder: yes  Hallucinations:Hallucinations: Auditory Description of Auditory Hallucinations: per patient. per nursing patient is yelling throughout the night and responding. observed patient responding to himself later in the afternoon.  Ideas of Reference:None  Suicidal Thoughts:Suicidal Thoughts: No  Homicidal Thoughts:Homicidal Thoughts: No   Sensorium  Memory:Immediate Good; Recent Good; Remote Good  Judgment:Fair  Insight:Fair   Executive Functions  Concentration:Good  Attention Span:Good  Recall:Good  Fund of Knowledge:Good  Language:Good   Psychomotor Activity  Psychomotor Activity:Psychomotor Activity: Normal   Assets  Assets:Desire for Improvement; Social Support   Sleep  Sleep:Sleep: Poor (responding to internal stimuli throughout night per nursing and yelling to himself)  Estimated Sleeping Duration (Last 24 Hours): 8.50-10.50 hours  Physical Exam: Physical Exam Vitals and nursing note reviewed.  HENT:     Head: Normocephalic and atraumatic.  Pulmonary:     Effort: Pulmonary effort is normal.  Neurological:     General: No focal deficit present.     Mental Status: He is alert.  Psychiatric:     Comments: No obvious EPS.    Review of Systems  Constitutional:  Negative for diaphoresis and fever.  Cardiovascular:  Negative for chest pain and palpitations.  Gastrointestinal:  Negative for constipation, diarrhea, nausea and vomiting.  Neurological:  Negative for dizziness, tremors, weakness and headaches.   Blood pressure 131/82, pulse 85, temperature 98 F (36.7 C), temperature source Oral, resp. rate 14, height 5' 9.5 (1.765 m), weight 72.6 kg, SpO2 97%. Body mass index is 23.29 kg/m.   Treatment Plan Summary: Daily contact with patient to assess and evaluate  symptoms and progress in treatment and Medication management   ASSESSMENT & PLAN  ASSESSMENT:   Diagnoses / Active Problems:  Principal Problem:   Schizoaffective disorder (HCC)  [name] [name] is a 48 y.o. male  with a past psychiatric history of numerous diagnosis including schizoaffective, extensive hospitalizations over last several years including July 2025, no suicide attempts. Patient initially arrived to [facility] on 07/22/2024 for auditory hallucinations, and admitted to [facility] on  07/23/2024 for intensive therapeutic interventions and stabilization of acute on chronic psychiatric conditions.   Presentation is likely a primary psychotic disorder given he is responding to himself and yelling at note and noted to be responding again during the day. No substance in his system on UDS and denies recently substance use. 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 Extensive SI which represents severe mood disorders so feel that schizoaffective unspecified is most appropriate fit currently.   Experiencing homlessness and having barriers to assess care to have medication refilled. Currently in [place] which he knows well so is hopeful to get to [facility] okay. Discussed ACTT but pt has medicare which does not cover ACTT. Plan to restart to seroquel  which he has found effective for his target symptoms of AH and insomnia. Says he has been on 200 mg historically but is not certain and suspect this was higher. Recently was on zyprexa  but says he does not find it efficacious for AH. Plan to up titrate Seroquel  daily and remain on twice daily IR insated of XR per patient preference.   Ethanol negative and no withdrawal symptoms so do not supsect alc withdrawal but will do CIWA w/ Ativan  PRN to be safe. CMP/CBC are unremarkable. Has had metabolic labs recently inclduing A1c and lipid panel. A1c is 5.8 which is  prediabetes range but since he is active and walk and has nomral BMI and no medical issues will defer metformin at this time but could consider in the future if on seroquel  long term or other mod-high risk metabolic antipsychotic. Negative RPR. No HIV in 10 years so will add this. Negative CT in 2016. TSH within normal limits recently.    PLAN: Safety and Monitoring:             -- Voluntary admission to inpatient psychiatric unit for safety, stabilization and treatment             -- Daily contact with patient to assess and evaluate symptoms and progress in treatment             -- Patient's case to be discussed in multi-disciplinary team meeting             -- Observation Level : q15 minute checks             -- Vital signs:  q12 hours             -- Precautions: suicide, elopement, and assault   2. Psychiatric Treatment:  --  start quetiapine  50 mg once in the morning for schizoaffective -- start quetiapine  150 mg once at bedtime for schizoaffective   -- Continue trazodone  50 mg once nightly as needed for insomnia  -- Continue hydroxyzine  25 mg 3 times daily as needed for anxiety  --  The risks/benefits/side-effects/alternatives to this medication were discussed in detail with the patient and time was given for questions. The patient consents to medication trial.              -- Metabolic profile and EKG monitoring obtained while on an atypical antipsychotic. See #4 below for values.              -- Encouraged patient to participate in unit milieu and in scheduled group therapies              -- Short Term Goals: Ability to identify changes in lifestyle to reduce  recurrence of condition will improve, Ability to verbalize feelings will improve, Ability to disclose and discuss suicidal ideas, Ability to demonstrate self-control will improve, Ability to identify and develop effective coping behaviors will improve, Ability to maintain clinical measurements within normal limits will improve,  Compliance with prescribed medications will improve, and Ability to identify triggers associated with substance abuse/mental health issues will improve             -- Long Term Goals: Improvement in symptoms so as ready for discharge                3. Medical Issues Being Addressed:              -- start Ativan  as needed for CIWA > 10    -- Continue PRN's: Tylenol , Maalox, Milk of Magnesia   4. Routine and other pertinent labs reviewed: EKG monitoring: QTc: 432   NSR  BMI: Body mass index is 23.29 kg/m.  Lipid Panel: Lab Results  Component Value Date   CHOL 170 10/10/2021   TRIG 86 10/10/2021   HDL 64 10/10/2021   CHOLHDL 2.7 10/10/2021   VLDL 17 10/10/2021   LDLCALC 89 10/10/2021   LDLCALC 66 09/22/2021    HbgA1c: 5.8 (July 2025)  TSH: TSH (uIU/mL)  Date Value  09/22/2021 2.873  01/27/2013 0.344 (L)    Labs to order: HIV for medical rule out psychosis  5. Discharge Planning:              -- Social work and case management to assist with discharge planning and identification of hospital follow-up needs prior to discharge             -- Estimated LOS: 07/31/2024             -- Discharge Concerns: Need to establish a safety plan; Medication compliance and effectiveness             -- Discharge Goals: Return home with outpatient referrals for mental health follow-up including medication management/psychotherapy    I certify that inpatient services furnished can reasonably be expected to improve the patient's condition.     Justino Cornish, MD PGY-2 Psychiatry Resident 07/24/2024, 2:54 PM

## 2024-07-24 NOTE — [facility] Suicide Risk Assessment (Signed)
 Suicide Risk Assessment  Admission Assessment    [facility] Admission Suicide Risk Assessment   Nursing information obtained from:  Patient Demographic factors:  Male, Low socioeconomic status Current Mental Status:  NA Loss Factors:  NA Historical Factors:  Prior suicide attempts Risk Reduction Factors:  Positive social support  Total Time spent with patient: 1 hour Principal Problem: Schizoaffective disorder (HCC) Diagnosis:  Principal Problem:   Schizoaffective disorder (HCC)  Subjective Data: see h&P  Continued Clinical Symptoms:  Alcohol  Use Disorder Identification Test Final Score (AUDIT): 0 The Alcohol  Use Disorders Identification Test, Guidelines for Use in Primary Care, Second Edition.  World Science writer [facility]). Score between 0-7:  no or low risk or alcohol  related problems. Score between 8-15:  moderate risk of alcohol  related problems. Score between 16-19:  high risk of alcohol  related problems. Score 20 or above:  warrants further diagnostic evaluation for alcohol  dependence and treatment.   CLINICAL FACTORS:   Schizophrenia and insomnia.    Musculoskeletal: Strength & Muscle Tone: within normal limits Gait & Station: normal Patient leans: N/A  Psychiatric Specialty Exam:  Presentation  General Appearance:  -- (unkept but showered.)  Eye Contact: Good  Speech: Normal Rate  Speech Volume: Normal  Handedness:No data recorded  Mood and Affect  Mood: Euthymic  Affect: Appropriate   Thought Process  Thought Processes: Coherent  Descriptions of Associations:Intact  Orientation:Full (Time, Place and Person)  Thought Content:Logical  History of Schizophrenia/Schizoaffective disorder:No data recorded Duration of Psychotic Symptoms:No data recorded Hallucinations:Hallucinations: Auditory Description of Auditory Hallucinations: per patient. per nursing patient is yelling throughout the night and responding. observed patient responding to  himself later in the afternoon.  Ideas of Reference:None  Suicidal Thoughts:Suicidal Thoughts: No  Homicidal Thoughts:Homicidal Thoughts: No   Sensorium  Memory: Immediate Good; Recent Good; Remote Good  Judgment: Fair  Insight: Fair   Art therapist  Concentration: Good  Attention Span: Good  Recall: Good  Fund of Knowledge: Good  Language: Good   Psychomotor Activity  Psychomotor Activity: Psychomotor Activity: Normal   Assets  Assets: Desire for Improvement; Social Support   Sleep  Sleep: Sleep: Poor (responding to internal stimuli throughout night per nursing and yelling to himself)    Physical Exam: Physical Exam Vitals and nursing note reviewed.  HENT:     Head: Normocephalic and atraumatic.  Pulmonary:     Effort: Pulmonary effort is normal.  Neurological:     General: No focal deficit present.     Mental Status: He is alert.  Psychiatric:     Comments: No obvious EPS.    Review of Systems  Constitutional:  Negative for diaphoresis and fever.  Cardiovascular:  Negative for chest pain and palpitations.  Gastrointestinal:  Negative for constipation, diarrhea, nausea and vomiting.  Neurological:  Negative for dizziness, tremors, weakness and headaches.  Psychiatric/Behavioral:         Pt denies extrapyramidal symptoms including dystonia (sudden spastic contractions of muscle groups), parkinsonism (bradykinesia, tremors, rigidity), and akathisia (severe restlessness).    Blood pressure 131/82, pulse 85, temperature 98 F (36.7 C), temperature source Oral, resp. rate 14, height 5' 9.5 (1.765 m), weight 72.6 kg, SpO2 97%. Body mass index is 23.29 kg/m.   COGNITIVE FEATURES THAT CONTRIBUTE TO RISK:  None    SUICIDE RISK:   Minimal: No identifiable suicidal ideation.  Patients presenting with no risk factors but with morbid ruminations; may be classified as minimal risk based on the severity of the depressive symptoms  PLAN  OF  CARE: see H&P  I certify that inpatient services furnished can reasonably be expected to improve the patient's condition.   [name] Cornish, MD 07/24/2024, 2:48 PM

## 2024-07-24 NOTE — Progress Notes (Signed)
   07/24/24 1000  Psych Admission Type (Psych Patients Only)  Admission Status Voluntary  Psychosocial Assessment  Patient Complaints Irritability;Anxiety  Eye Contact Brief  Facial Expression Animated  Affect Preoccupied  Speech Loud  Interaction Assertive  Motor Activity Fidgety;Pacing  Appearance/Hygiene Unremarkable  Behavior Characteristics Pacing  Mood Preoccupied  Thought Process  Coherency WDL  Content WDL  Delusions Paranoid  Perception Hallucinations  Hallucination Auditory  Judgment Poor  Confusion None  Danger to Self  Current suicidal ideation? Denies  Danger to Others  Danger to Others None reported or observed   Dar Note: Patient presents with anxious and irritable mood.  Patient observed responding to internal stimuli, pacing the hallway, yelling and talking out loudly to himself.  Haldol  5 mg and Benadryl  50 mg given orally with fair effect.  Routine safety checks maintained.  Support and encouragement offered as needed.  Patient is safe on and off the unit.

## 2024-07-24 NOTE — Plan of Care (Signed)
   Problem: Education: Goal: Emotional status will improve Outcome: Not Progressing Goal: Mental status will improve Outcome: Not Progressing

## 2024-07-24 NOTE — Plan of Care (Signed)
  Problem: Education: Goal: Emotional status will improve Outcome: Progressing Goal: Mental status will improve Outcome: Progressing   Problem: Activity: Goal: Interest or engagement in activities will improve Outcome: Progressing Goal: Sleeping patterns will improve Outcome: Progressing   Problem: Coping: Goal: Ability to demonstrate self-control will improve Outcome: Progressing   Problem: Coping: Goal: Coping ability will improve Outcome: Progressing

## 2024-07-25 DIAGNOSIS — F251 Schizoaffective disorder, depressive type: Secondary | ICD-10-CM

## 2024-07-25 LAB — HIV ANTIBODY (ROUTINE TESTING W REFLEX): HIV Screen 4th Generation wRfx: NONREACTIVE

## 2024-07-25 MED ORDER — QUETIAPINE FUMARATE 100 MG PO TABS
100.0000 mg | ORAL_TABLET | Freq: Every day | ORAL | Status: DC
  Administered 2024-07-26 – 2024-07-28 (×3): 100 mg via ORAL
  Filled 2024-07-25 (×2): qty 1
  Filled 2024-07-25 (×2): qty 21
  Filled 2024-07-25: qty 1

## 2024-07-25 MED ORDER — QUETIAPINE FUMARATE 200 MG PO TABS
200.0000 mg | ORAL_TABLET | Freq: Every day | ORAL | Status: DC
  Administered 2024-07-25 – 2024-07-27 (×3): 200 mg via ORAL
  Filled 2024-07-25 (×3): qty 1

## 2024-07-25 NOTE — Group Note (Signed)
 Date:  07/25/2024 Time:  8:44 PM  Group Topic/Focus:  Wrap-Up Group:   The focus of this group is to help patients review their daily goal of treatment and discuss progress on daily workbooks.    Participation Level:  Did Not Attend   Jonique Kulig Dacosta 07/25/2024, 8:44 PM

## 2024-07-25 NOTE — Plan of Care (Signed)
   Problem: Education: Goal: Knowledge of [facility] Education information/materials will improve Outcome: Progressing Goal: Emotional status will improve Outcome: Progressing Goal: Mental status will improve Outcome: Progressing Goal: Verbalization of understanding the information provided will improve Outcome: Progressing
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
## 2024-07-25 NOTE — Group Note (Signed)
 [facility] LCSW Group Therapy Note   Group Date: 07/25/2024 Start Time: 1015 End Time: 1115  Type of Therapy/Topic:  Group Therapy:  Balance in Life.   Participation Level:  Did Not Attend    Hunter JONELLE Lever, LCSWA

## 2024-07-25 NOTE — Progress Notes (Addendum)
 [facility] MD Progress Note  07/25/2024 8:11 AM [name] [name]  MRN:  [id number]  Principal Problem: Schizoaffective disorder [facility]) Diagnosis: Principal Problem:   Schizoaffective disorder (HCC)  Total Time spent with patient: 35 minutes   Identifying Information and Past Psychiatric History:  The patient is a 48 y.o. male (homeless, unemployed) with no significant medical history and a psychiatric history of with schizoaffective disorder, depressed type, and stimulant use in remission. He is admitted voluntarily due to ongoing disturbing hallucinations in the setting of lack of access to medications.   Patient's psychiatric history is notable for innumerable psychiatric admissions from early adult life (20s) for a combination of low mood, SI and psychotic symptoms. Although substance use has confounded this significantly (many years of meth, cocaine and opioid use throughout adult life) he has been sober from all substances for approximately 3 years but has continued to demonstrate significant hallucinations, paranoia and disorganized behavior (yelling to self) consistent with an underlying primary psychotic disorder. He has had recurrent depressive episodes and SI throughout his adult life, but no known suicide attempts.   Interval events: Patient evaluated on the floor yesterday. Prior effective medication per patient was seroquel  and this was restarted for him at 50 mg in the morning and 150 at night. He was notably yelling to himself in his room throughout the day yesterday and required PRN medications to address AH. Otherwise denying SI or mood symptoms to staff, although noted during interations to be preoccupied and irritable. No significant behavioral concerns reported. Did attend one group.  Interview today: Today the patient reports he is alright. States he slept better yesterday after addition of Seroquel . He did not hear any voices overnight and he is not hearing them this morning. Does  report that he had previously been on a higher dose at night, but does not feel the need to increase currently given voices have improved. Otherwise denies depression or SI. Good sleep and appetite. Reports he would like to stay in [place] area for a while and try to get things set up here after the hospital. Requesting to speak to SW at some point today. No other concerns voiced. No SI, HI or AVH.   Later in the day, patient heard screaming in his room, hearing voices and believing he was being attacked   Past Medical History:  Past Medical History:  Diagnosis Date   Bipolar disorder (HCC)    Cellulitis    Depression    Drug abuse (HCC)    Hepatitis C     Past Surgical History:  Procedure Laterality Date   BACK SURGERY     ROTATOR CUFF REPAIR     SHOULDER SURGERY     Family History:  Family History  Problem Relation Age of Onset   Alcohol  abuse Father    Cancer Maternal Aunt     Social History:  Social History   Substance and Sexual Activity  Alcohol  Use Yes   Comment: 6-7 bottles of wine per day     Social History   Substance and Sexual Activity  Drug Use Not Currently   Types: Marijuana, Benzodiazepines, Other-see comments, Methamphetamines, Cocaine   Comment: denies    Social History   Socioeconomic History   Marital status: Divorced    Spouse name: Not on file   Number of children: 2   Years of education: Not on file   Highest education level: GED or equivalent  Occupational History   Not on file  Tobacco Use  Smoking status: Every Day    Current packs/day: 1.00    Average packs/day: 1 pack/day for 42.8 years (42.8 ttl pk-yrs)    Types: Cigarettes    Start date: 10/22/1981   Smokeless tobacco: Never  Vaping Use   Vaping status: Never Used  Substance and Sexual Activity   Alcohol  use: Yes    Comment: 6-7 bottles of wine per day   Drug use: Not Currently    Types: Marijuana, Benzodiazepines, Other-see comments, Methamphetamines, Cocaine    Comment:  denies   Sexual activity: Not Currently  Other Topics Concern   Not on file  Social History Narrative   Not on file   Social Drivers of Health   Financial Resource Strain: High Risk (04/10/2023)   Received from [facility]   Overall Financial Resource Strain (CARDIA)    Difficulty of Paying Living Expenses: Hard  Food Insecurity: Patient Declined (07/23/2024)   Hunger Vital Sign    Worried About Running Out of Food in the Last Year: Patient declined    Ran Out of Food in the Last Year: Patient declined  Transportation Needs: Unmet Transportation Needs (07/23/2024)   PRAPARE - Administrator, Civil Service (Medical): Yes    Lack of Transportation (Non-Medical): No  Physical Activity: Inactive (04/10/2023)   Received from [facility]   Exercise Vital Sign    On average, how many days per week do you engage in moderate to strenuous exercise (like a brisk walk)?: 0 days    On average, how many minutes do you engage in exercise at this level?: 0 min  Stress: Stress Concern Present (04/22/2024)   Received from [facility] - Occupational Stress Questionnaire    Feeling of Stress : Rather much  Social Connections: Unknown (07/23/2024)   Social Connection and Isolation Panel    Frequency of Communication with Friends and Family: Patient declined    Frequency of Social Gatherings with Friends and Family: Patient declined    Attends Religious Services: Patient declined    Database administrator or Organizations: Patient declined    Attends Engineer, structural: Patient declined    Marital Status: Divorced    Current Medications: Current Facility-Administered Medications  Medication Dose Route Frequency Provider Last Rate Last Admin   acetaminophen  (TYLENOL ) tablet 650 mg  650 mg Oral Q6H PRN Brent, Amanda C, NP       alum & mag hydroxide-simeth (MAALOX/MYLANTA) 200-200-20 MG/5ML suspension 30 mL   30 mL Oral Q4H PRN Brent, Amanda C, NP       haloperidol  (HALDOL ) tablet 5 mg  5 mg Oral TID PRN Brent, Amanda C, NP   5 mg at 07/24/24 1009   And   diphenhydrAMINE  (BENADRYL ) capsule 50 mg  50 mg Oral TID PRN Brent, Amanda C, NP   50 mg at 07/24/24 1008   haloperidol  lactate (HALDOL ) injection 5 mg  5 mg Intramuscular TID PRN Thresa Alan BROCKS, NP       And   diphenhydrAMINE  (BENADRYL ) injection 50 mg  50 mg Intramuscular TID PRN Brent, Amanda C, NP       And   LORazepam  (ATIVAN ) injection 2 mg  2 mg Intramuscular TID PRN Brent, Amanda C, NP       haloperidol  lactate (HALDOL ) injection 10 mg  10 mg Intramuscular TID PRN Thresa Alan BROCKS, NP       And   diphenhydrAMINE  (BENADRYL ) injection  50 mg  50 mg Intramuscular TID PRN Brent, Amanda C, NP       And   LORazepam  (ATIVAN ) injection 2 mg  2 mg Intramuscular TID PRN Brent, Amanda C, NP       hydrOXYzine  (ATARAX ) tablet 25 mg  25 mg Oral TID PRN Brent, Amanda C, NP   25 mg at 07/24/24 2024   LORazepam  (ATIVAN ) tablet 1 mg  1 mg Oral Q6H PRN McCarty, Artie, MD       magnesium  hydroxide (MILK OF MAGNESIA) suspension 30 mL  30 mL Oral Daily PRN Brent, Amanda C, NP   30 mL at 07/24/24 2024   multivitamin with minerals tablet 1 tablet  1 tablet Oral Daily McCarty, Artie, MD       nicotine  (NICODERM CQ  - dosed in mg/24 hours) patch 21 mg  21 mg Transdermal Daily Brent, Amanda C, NP   21 mg at 07/24/24 9192   ondansetron  (ZOFRAN ) tablet 4 mg  4 mg Oral Q8H PRN Brent, Amanda C, NP       QUEtiapine  (SEROQUEL ) tablet 150 mg  150 mg Oral QHS McCarty, Artie, MD   150 mg at 07/24/24 2024   QUEtiapine  (SEROQUEL ) tablet 50 mg  50 mg Oral Daily McCarty, Artie, MD   50 mg at 07/24/24 1240   thiamine  (Vitamin B-1) tablet 100 mg  100 mg Oral Daily McCarty, Artie, MD       traZODone  (DESYREL ) tablet 50 mg  50 mg Oral QHS PRN Brent, Amanda C, NP   50 mg at 07/24/24 2024    Lab Results: No results found for this or any previous visit (from the past 48  hours).  Blood Alcohol  level:  Lab Results  Component Value Date   ETH <15 07/22/2024   ETH 199 (H) 10/05/2021    Metabolic Disorder Labs: Lab Results  Component Value Date   HGBA1C 5.4 09/22/2021   MPG 108.28 09/22/2021   MPG 117 04/05/2021   No results found for: PROLACTIN Lab Results  Component Value Date   CHOL 170 10/10/2021   TRIG 86 10/10/2021   HDL 64 10/10/2021   CHOLHDL 2.7 10/10/2021   VLDL 17 10/10/2021   LDLCALC 89 10/10/2021   LDLCALC 66 09/22/2021    Physical Findings:   Mental Status exam: Appearance: white male of average BMI, fair grooming, light-colored hair, seen sitting in his room in bed  Eye contact: good  Attitude towards examiner cooperative, although somewhat withdrawn, possibly guarded  Psychomotor: no agitation or retardation Speech: reduced amount, often one-word responses or short phrases, normal in rate and volume  Language: no delays  Mood: alright  Affect: remains restricted  Thought content: denying SI and HI, no delusions expressed although patient does not volunteer information past what is directly asked  Thought Process: largely linear, somewhat concrete  Perception: denying AVH this morning, not RTIS  Insight: good  Judgement: fair to good   Orientation: x3 Attention/Concentration: fair to good - attends to interview  Memory/Cognition: grossly intact based on conversation   Progress Energy of Knowledge: Average    Musculoskeletal: Strength & Muscle Tone: within normal limits Gait & Station: normal Patient leans: N/A   Physical Exam Constitutional:      General: He is not in acute distress. HENT:     Head: Normocephalic and atraumatic.  Eyes:     Extraocular Movements: Extraocular movements intact.  Abdominal:     General: There is no distension.  Musculoskeletal:     Cervical  back: Normal range of motion.    ROS Blood pressure 130/89, pulse 87, temperature (!) 97.5 F (36.4 C), temperature source Oral, resp. rate  14, height 5' 9.5 (1.765 m), weight 72.6 kg, SpO2 99%. Body mass index is 23.29 kg/m.   Treatment Plan Summary: Daily contact with patient to assess and evaluate symptoms and progress in treatment  Assessment: The patient is a 48 y.o. male with a psychiatric history currently most consistent with schizoaffective disorder, depressed type. He has presented with 20+ years of psychotic symptoms characterized by hallucinations, disorganized thoughts/behaviors, and poor ability to care for self. He has additionally presented with significant depressive symptoms with numerous hospitalizations for psychosis with depression and SI. Although confounded by a significant history of substance use (meth, opioids, alcohol ) the patient has been sober for at least 3 years with continuation of both mood and psychotic symptoms indicating schizoaffective disorder, DT as the most accurate diagnosis. No known history of mania outside of substance use concerns.   On this admission he presents with significant and distressing AH, seen yelling loudly to self in room, irritable and preoccupied in affect and requiring frequent PRNs for voices. Given active psychotic symptoms and patient preference to restart seroquel  (which he voiced best benefit to in the past) we restarted 50 in the morning + 150 at night (IR version per patient preference).   Today the patient was initially voicing significant improvement in voices. He denied hearing any after he took the nighttime seroquel  and slept well throughout the night. He was not RTIS but did present with restricted affect, reduced amount of speech and somewhat guarded attitude. Plan was to continue current dose of seroquel , however later in the afternoon heart by staff to be screaming, hearing voices and believing he is being attacked requiring PRN medications. Will increase nighttime Seroquel  to 200 mg tonight and daytime to 100 mg tomorrow morning.   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   Plan:  Legal Status: -Voluntary   Safety -q15 minute checks  -elopement, suicide and assault precautions  -daily vitals  Psychiatric Concerns  -Increase Seroquel  IR to 200 mg tonight and morning dose to 100 mg for tomorrow   - Continue trazodone  50 mg once nightly as needed for insomnia - Continue hydroxyzine  25 mg 3 times daily as needed for anxiety  Substance use concerns  -none currently (sober x3 years per patient; UDS negative this admission)   Nicotine  Replacement  Patch and gum   Medical concerns -No chronic conditions requiring medications   Additional PRNs: -Tylenol  tablets 650 mg every 6 hours as needed for pain -Maalox/Mylanta suspension 30 mL every 4 hours as needed for indigestion  -Milk of Magnesia 30 mL daily as needed for constipation  Labs -Reviewed as documented in HPI  Psychosocial interventions  -daily medication management with psychiatry -Medication education regarding risks/benefits and alternatives -bedside psychotherapy as indicated  -Patient will be encouraged to participate and engage with group therapy  -Appreciate SW assistance in coordinating safe disposition    [name] [name] Arts, MD 07/25/2024, 8:11 AM

## 2024-07-26 NOTE — Group Note (Signed)
 Date:  07/26/2024 Time:  8:50 PM  Group Topic/Focus:  Wrap-Up Group:   The focus of this group is to help patients review their daily goal of treatment and discuss progress on daily workbooks.    Participation Level:  None  Participation Quality:  Inattentive  Affect:  Blunted  Cognitive:  Alert and Appropriate  Insight: None  Engagement in Group:  None  Modes of Intervention:  Discussion and Education  Additional Comments:  Pt was in and out of wrap up group this evening. Pt opted out of sharing their day, except to say that they would like to D/C.   Rosella DELENA Pouch 07/26/2024, 8:50 PM

## 2024-07-26 NOTE — Plan of Care (Signed)
   Problem: Education: Goal: Knowledge of [facility] Education information/materials will improve Outcome: Progressing Goal: Emotional status will improve Outcome: Progressing Goal: Mental status will improve Outcome: Progressing Goal: Verbalization of understanding the information provided will improve Outcome: Progressing

## 2024-07-26 NOTE — Progress Notes (Signed)
 [facility] MD Progress Note  07/26/2024 2:52 PM [name] [name]  MRN:  [id number]  Principal Problem: Schizoaffective disorder [facility]) Diagnosis: Principal Problem:   Schizoaffective disorder (HCC)  Total Time spent with patient: 35 minutes   Identifying Information and Past Psychiatric History:  The patient is a 48 y.o. male (homeless, unemployed) with no significant medical history and a psychiatric history of with schizoaffective disorder, depressed type, and stimulant use in remission. He is admitted voluntarily due to ongoing disturbing hallucinations in the setting of lack of access to medications.   Patient's psychiatric history is notable for innumerable psychiatric admissions from early adult life (20s) for a combination of low mood, SI and psychotic symptoms. Although substance use has confounded this significantly (many years of meth, cocaine and opioid use throughout adult life) he has been sober from all substances for approximately 3 years but has continued to demonstrate significant hallucinations, paranoia and disorganized behavior (yelling to self) consistent with an underlying primary psychotic disorder. He has had recurrent depressive episodes and SI throughout his adult life, but no known suicide attempts.   Interval events: Patient required prn agitation meds after lunch yesterday. No further behavioral concerns or administration of prn agitation medications required overnight. PRNS: Trazodone  and Hydroxyzine  given last night. Slept 8 hours. Denies SI/HI/AVH to nursing staff overnight. Did not attend groups. Nurses reported less incidences of him screaming yesterday after agitation protocol given.   Interview today: Today the patient reports he is fine. Denies oversedation after increasing morning and at bedtime seroquel  doses. Denies any auditory or visual hallucinations and are currently controlled on seroquel . Good sleep and appetite. No other concerns voiced. No SI, HI or AVH.    Past Medical History:  Past Medical History:  Diagnosis Date   Bipolar disorder (HCC)    Cellulitis    Depression    Drug abuse (HCC)    Hepatitis C     Past Surgical History:  Procedure Laterality Date   BACK SURGERY     ROTATOR CUFF REPAIR     SHOULDER SURGERY     Family History:  Family History  Problem Relation Age of Onset   Alcohol  abuse Father    Cancer Maternal Aunt     Social History:  Social History   Substance and Sexual Activity  Alcohol  Use Yes   Comment: 6-7 bottles of wine per day     Social History   Substance and Sexual Activity  Drug Use Not Currently   Types: Marijuana, Benzodiazepines, Other-see comments, Methamphetamines, Cocaine   Comment: denies    Social History   Socioeconomic History   Marital status: Divorced    Spouse name: Not on file   Number of children: 2   Years of education: Not on file   Highest education level: GED or equivalent  Occupational History   Not on file  Tobacco Use   Smoking status: Every Day    Current packs/day: 1.00    Average packs/day: 1 pack/day for 42.8 years (42.8 ttl pk-yrs)    Types: Cigarettes    Start date: 10/22/1981   Smokeless tobacco: Never  Vaping Use   Vaping status: Never Used  Substance and Sexual Activity   Alcohol  use: Yes    Comment: 6-7 bottles of wine per day   Drug use: Not Currently    Types: Marijuana, Benzodiazepines, Other-see comments, Methamphetamines, Cocaine    Comment: denies   Sexual activity: Not Currently  Other Topics Concern   Not on file  Social  History Narrative   Not on file   Social Drivers of Health   Financial Resource Strain: High Risk (04/10/2023)   Received from [facility]   Overall Financial Resource Strain (CARDIA)    Difficulty of Paying Living Expenses: Hard  Food Insecurity: Patient Declined (07/23/2024)   Hunger Vital Sign    Worried About Running Out of Food in the Last Year: Patient declined    Ran Out of Food in the Last  Year: Patient declined  Transportation Needs: Unmet Transportation Needs (07/23/2024)   PRAPARE - Administrator, Civil Service (Medical): Yes    Lack of Transportation (Non-Medical): No  Physical Activity: Inactive (04/10/2023)   Received from [facility]   Exercise Vital Sign    On average, how many days per week do you engage in moderate to strenuous exercise (like a brisk walk)?: 0 days    On average, how many minutes do you engage in exercise at this level?: 0 min  Stress: Stress Concern Present (04/22/2024)   Received from [facility] - Occupational Stress Questionnaire    Feeling of Stress : Rather much  Social Connections: Unknown (07/23/2024)   Social Connection and Isolation Panel    Frequency of Communication with Friends and Family: Patient declined    Frequency of Social Gatherings with Friends and Family: Patient declined    Attends Religious Services: Patient declined    Database administrator or Organizations: Patient declined    Attends Engineer, structural: Patient declined    Marital Status: Divorced    Current Medications: Current Facility-Administered Medications  Medication Dose Route Frequency Provider Last Rate Last Admin   acetaminophen  (TYLENOL ) tablet 650 mg  650 mg Oral Q6H PRN Brent, Amanda C, NP       alum & mag hydroxide-simeth (MAALOX/MYLANTA) 200-200-20 MG/5ML suspension 30 mL  30 mL Oral Q4H PRN Brent, Amanda C, NP       haloperidol  (HALDOL ) tablet 5 mg  5 mg Oral TID PRN Brent, Amanda C, NP   5 mg at 07/25/24 1255   And   diphenhydrAMINE  (BENADRYL ) capsule 50 mg  50 mg Oral TID PRN Brent, Amanda C, NP   50 mg at 07/25/24 1255   haloperidol  lactate (HALDOL ) injection 5 mg  5 mg Intramuscular TID PRN Brent, Amanda C, NP       And   diphenhydrAMINE  (BENADRYL ) injection 50 mg  50 mg Intramuscular TID PRN Brent, Amanda C, NP       And   LORazepam  (ATIVAN ) injection 2 mg  2 mg  Intramuscular TID PRN Brent, Amanda C, NP       haloperidol  lactate (HALDOL ) injection 10 mg  10 mg Intramuscular TID PRN Thresa Alan BROCKS, NP       And   diphenhydrAMINE  (BENADRYL ) injection 50 mg  50 mg Intramuscular TID PRN Thresa Alan BROCKS, NP       And   LORazepam  (ATIVAN ) injection 2 mg  2 mg Intramuscular TID PRN Brent, Amanda C, NP       hydrOXYzine  (ATARAX ) tablet 25 mg  25 mg Oral TID PRN Brent, Amanda C, NP   25 mg at 07/25/24 2023   LORazepam  (ATIVAN ) tablet 1 mg  1 mg Oral Q6H PRN McCarty, Artie, MD       magnesium  hydroxide (MILK OF MAGNESIA) suspension 30 mL  30 mL Oral Daily PRN Brent, Amanda C, NP  30 mL at 07/25/24 1512   multivitamin with minerals tablet 1 tablet  1 tablet Oral Daily McCarty, Artie, MD   1 tablet at 07/26/24 0825   nicotine  (NICODERM CQ  - dosed in mg/24 hours) patch 21 mg  21 mg Transdermal Daily Brent, Amanda C, NP   21 mg at 07/26/24 0825   ondansetron  (ZOFRAN ) tablet 4 mg  4 mg Oral Q8H PRN Brent, Amanda C, NP       QUEtiapine  (SEROQUEL ) tablet 100 mg  100 mg Oral Daily Butler, Laura N, MD   100 mg at 07/26/24 0825   QUEtiapine  (SEROQUEL ) tablet 200 mg  200 mg Oral QHS Butler, Laura N, MD   200 mg at 07/25/24 2023   thiamine  (Vitamin B-1) tablet 100 mg  100 mg Oral Daily McCarty, Artie, MD   100 mg at 07/26/24 0825   traZODone  (DESYREL ) tablet 50 mg  50 mg Oral QHS PRN Brent, Amanda C, NP   50 mg at 07/25/24 2023    Lab Results:  Results for orders placed or performed during the hospital encounter of 07/23/24 (from the past 48 hours)  HIV Antibody (routine testing w rflx)     Status: None   Collection Time: 07/25/24  6:22 AM  Result Value Ref Range   HIV Screen 4th Generation wRfx Non Reactive Non Reactive    Comment: Performed at [facility], [address] [place], [place] [address]    Blood Alcohol  level:  Lab Results  Component Value Date   ETH <15 07/22/2024   ETH 199 (H) 10/05/2021    Metabolic Disorder Labs: Lab Results   Component Value Date   HGBA1C 5.4 09/22/2021   MPG 108.28 09/22/2021   MPG 117 04/05/2021   No results found for: PROLACTIN Lab Results  Component Value Date   CHOL 170 10/10/2021   TRIG 86 10/10/2021   HDL 64 10/10/2021   CHOLHDL 2.7 10/10/2021   VLDL 17 10/10/2021   LDLCALC 89 10/10/2021   LDLCALC 66 09/22/2021    Physical Findings:   Mental Status exam: Appearance: white male of average BMI, fair grooming, light-colored hair, seen sitting in his room in bed  Eye contact: good  Attitude towards examiner cooperative, although somewhat withdrawn, possibly guarded  Psychomotor: no agitation or retardation Speech: reduced amount, often one-word responses or short phrases, normal in rate and volume  Language: no delays  Mood: fine  Affect: remains restricted  Thought content: denying SI and HI, no delusions expressed although patient does not volunteer information past what is directly asked  Thought Process: largely linear, somewhat concrete  Perception: denying AVH this morning, not RTIS  Insight: good  Judgement: fair to good   Orientation: x3 Attention/Concentration: fair to good - attends to interview  Memory/Cognition: grossly intact based on conversation   Progress Energy of Knowledge: Average    Musculoskeletal: Strength & Muscle Tone: within normal limits Gait & Station: normal Patient leans: N/A   Physical Exam Constitutional:      General: He is not in acute distress. HENT:     Head: Normocephalic and atraumatic.  Eyes:     Extraocular Movements: Extraocular movements intact.  Abdominal:     General: There is no distension.  Musculoskeletal:     Cervical back: Normal range of motion.    ROS Blood pressure 125/78, pulse 85, temperature (!) 97.5 F (36.4 C), temperature source Oral, resp. rate 14, height 5' 9.5 (1.765 m), weight 72.6 kg, SpO2 98%. Body mass  index is 23.29 kg/m.   Treatment Plan Summary: Daily contact with patient to assess and  evaluate symptoms and progress in treatment  Assessment: The patient is a 48 y.o. male with a psychiatric history currently most consistent with schizoaffective disorder, depressed type. He has presented with 20+ years of psychotic symptoms characterized by hallucinations, disorganized thoughts/behaviors, and poor ability to care for self. He has additionally presented with significant depressive symptoms with numerous hospitalizations for psychosis with depression and SI. Although confounded by a significant history of substance use (meth, opioids, alcohol ) the patient has been sober for at least 3 years with continuation of both mood and psychotic symptoms indicating schizoaffective disorder, DT as the most accurate diagnosis. No known history of mania outside of substance use concerns.   On this admission he presents with significant and distressing AH, seen yelling loudly to self in room, irritable and preoccupied in affect and requiring frequent PRNs for voices. Given active psychotic symptoms and patient preference to restart seroquel  (which he voiced best benefit to in the past) we restarted 50 in the morning + 150 at night (IR version per patient preference).   On morning assessment, patient denied auditory and visual hallucinations after increasing his seroquel . He was not RTIS. Will continue with current dosage of nighttime Seroquel  to 200 mg tonight and daytime to 100 mg morning.   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   Plan:  Legal Status: -Voluntary   Safety -q15 minute checks  -elopement, suicide and assault precautions  -daily vitals  Psychiatric Concerns  -Continue Seroquel  IR to 200 mg at bedtime ( increased 10/5)  - Increased Seroquel  IR to 100 mg qAM today    - Continue trazodone  50 mg once nightly as needed for insomnia - Continue hydroxyzine  25 mg 3 times daily as needed for anxiety  Substance use  concerns  -none currently (sober x3 years per patient; UDS negative this admission)   Nicotine  Replacement  Patch and gum   Medical concerns -No chronic conditions requiring medications   Additional PRNs: -Tylenol  tablets 650 mg every 6 hours as needed for pain -Maalox/Mylanta suspension 30 mL every 4 hours as needed for indigestion  -Milk of Magnesia 30 mL daily as needed for constipation  Labs -Reviewed as documented in HPI  Psychosocial interventions  -daily medication management with psychiatry -Medication education regarding risks/benefits and alternatives -bedside psychotherapy as indicated  -Patient will be encouraged to participate and engage with group therapy  -Appreciate SW assistance in coordinating safe disposition    PATTI OLDEN, MD 07/26/2024, 2:52 PM

## 2024-07-26 NOTE — Progress Notes (Signed)
   07/26/24 1300  Psych Admission Type (Psych Patients Only)  Admission Status Voluntary  Psychosocial Assessment  Patient Complaints None  Eye Contact Fair  Facial Expression Animated  Affect Preoccupied  Speech Soft  Interaction Assertive  Motor Activity Pacing  Appearance/Hygiene Disheveled  Behavior Characteristics Cooperative  Mood Preoccupied  Thought Process  Coherency WDL  Content WDL  Delusions None reported or observed  Perception Hallucinations  Hallucination Auditory  Judgment Poor  Confusion None  Danger to Self  Current suicidal ideation? Denies  Danger to Others  Danger to Others None reported or observed

## 2024-07-26 NOTE — Plan of Care (Signed)
   Problem: Education: Goal: Emotional status will improve Outcome: Progressing Goal: Mental status will improve Outcome: Progressing

## 2024-07-26 NOTE — BH Assessment (Signed)
(  Sleep Hours) - 8 (Any PRNs that were needed, meds refused, or side effects to meds)-  (Any disturbances and when (visitation, over night)- None (Concerns raised by the patient)- None (SI/HI/AVH)- Denies

## 2024-07-27 DIAGNOSIS — F1590 Other stimulant use, unspecified, uncomplicated: Secondary | ICD-10-CM | POA: Diagnosis present

## 2024-07-27 DIAGNOSIS — F1591 Other stimulant use, unspecified, in remission: Secondary | ICD-10-CM

## 2024-07-27 DIAGNOSIS — F259 Schizoaffective disorder, unspecified: Secondary | ICD-10-CM

## 2024-07-27 NOTE — Group Note (Signed)
 Recreation Therapy Group Note   Group Topic:Coping Skills  Group Date: 07/27/2024 Start Time: 1010 End Time: 1040 Facilitators: Chae Oommen-McCall, LRT,CTRS Location: [address] Dayroom   Group Topic: Coping Skills   Goal Area(s) Addresses: Patient will define what a coping skill is. Patient will work with peer to create a list of healthy coping skills beginning with each letter of the alphabet. Patient will successfully identify positive coping skills they can use post d/c.  Patient will acknowledge benefit(s) of using learned coping skills post d/c.  Behavioral Response: Minimal   Intervention: Worksheet   Activity: Coping A to Z. Patient asked to identify what a coping skill is and when they use them. Patients with Clinical research associate discussed healthy versus unhealthy coping skills. Next patients were given a blank worksheet titled Coping Skills A-Z.  Partners were instructed to come up with at least one positive coping skill per letter of the alphabet. Patients were given 15 minutes to brainstorm before ideas were presented to the large group. Patients and LRT debriefed on the importance of coping skill selection based on situation and back-up plans when a skill tried is not effective. At the end of group, patients were given an handout of alphabetized strategies to keep for future reference.   Education: Pharmacologist, Scientist, physiological, Discharge Planning.    Education Outcome: Acknowledges education/Verbalizes understanding/In group clarification offered/Additional education needed   Affect/Mood: Flat   Participation Level: Minimal   Participation Quality: Independent   Behavior: Attentive    Speech/Thought Process: Relevant   Insight: Fair   Judgement: Fair    Modes of Intervention: Worksheet   Patient Response to Interventions:  Attentive   Education Outcome:  In group clarification offered    Clinical Observations/Individualized Feedback: Pt was quiet but did  respond with prompted. Pt was only able to come up with three coping skills. Pt came up with affirmations, decisiveness and calming affect. Pt was attentive to peers as they gave their answers.      Plan: Continue to engage patient in RT group sessions 2-3x/week.   Jaren Vanetten-McCall, LRT,CTRS 07/27/2024 12:47 PM

## 2024-07-27 NOTE — Plan of Care (Signed)
   Problem: Education: Goal: Knowledge of [facility] Education information/materials will improve Outcome: Progressing Goal: Emotional status will improve Outcome: Progressing Goal: Mental status will improve Outcome: Progressing Goal: Verbalization of understanding the information provided will improve Outcome: Progressing

## 2024-07-27 NOTE — Group Note (Signed)
 LCSW Group Therapy Note   Group Date: 07/27/2024 Start Time: 1300 End Time: 1400   Participation:  patient was present for half of the group session.  He listened but didn't participate in the discussion.  Type of Therapy:  Group Therapy  Objective:  Learn techniques for managing stress through body relaxation, mindfulness, and self-compassion.  Goals: Use body relaxation techniques, such as Box Breathing and Progressive Muscle Relaxation, to reduce physical tension. Practice mindfulness to break the cycle of overthinking and mental chatter. Embrace self-compassion to handle stress with kindness and resilience.  Summary:  Today's session focused on calming the body with relaxation techniques, breaking the cycle of stress with mindfulness, and using self-compassion to manage challenges more gracefully. These tools help reduce stress and foster a balanced, peaceful mindset.  Therapeutic Modalities used:  Elements of CBT ( cognitive restructuring)  Elements of DBT (box breathing, progressive body relaxation, mindfulness, acceptance)    [name] [name] O [name] [name], LCSWA 07/27/2024  6:35 PM

## 2024-07-27 NOTE — Plan of Care (Signed)

## 2024-07-27 NOTE — Progress Notes (Addendum)
 Patient is alert, oriented, and cooperative. Denies SI, HI, AVH, and verbally contracts for safety. Patient reports he slept good last night without sleeping medication. Patient reports his appetite as good, energy level as normal, and concentration as good. Patient rates his depression 0/10, hopelessness 0/10, and anxiety 0/10. Patient denies physical symptoms/pain.   Patient was heard yelling in his room early this morning. No more yelling has been heard.    Scheduled medications administered per MD order. Support provided. Patient educated on safety on the unit and medications. Routine safety checks every 15 minutes. Patient stated understanding to tell nurse about any new physical symptoms. Patient understands to tell staff of any needs.     No adverse drug reactions noted. Patient remains safe at this time and will continue to monitor.    07/27/24 1000  Psych Admission Type (Psych Patients Only)  Admission Status Voluntary  Psychosocial Assessment  Patient Complaints None  Eye Contact Fair  Facial Expression Flat  Affect Preoccupied  Speech Soft  Interaction Isolative  Motor Activity Fidgety  Appearance/Hygiene Disheveled  Behavior Characteristics Appropriate to situation;Cooperative  Mood Preoccupied  Thought Process  Coherency WDL  Content WDL  Delusions None reported or observed  Perception WDL  Hallucination None reported or observed  Judgment Limited  Confusion None  Danger to Self  Current suicidal ideation? Denies  Agreement Not to Harm Self Yes  Description of Agreement verbal  Danger to Others  Danger to Others None reported or observed

## 2024-07-27 NOTE — Progress Notes (Signed)
 [facility] MD Progress Note  07/27/2024 1:58 PM [name] [name]  MRN:  [id number]  Principal Problem: Schizoaffective disorder [facility]) Diagnosis: Principal Problem:   Schizoaffective disorder (HCC) Active Problems:   Stimulant use disorder, severe, sustained remission  Total Time spent with patient: 35 minutes   Identifying Information and Past Psychiatric History:  The patient is a 48 y.o. male (homeless, unemployed) with no significant medical history and a psychiatric history of with schizoaffective disorder, depressed type, and stimulant use in remission. He is admitted voluntarily due to ongoing disturbing hallucinations in the setting of lack of access to medications.   Patient's psychiatric history is notable for innumerable psychiatric admissions from early adult life (20s) for a combination of low mood, SI and psychotic symptoms. Although substance use has confounded this significantly (many years of meth, cocaine and opioid use throughout adult life) he has been sober from all substances for approximately 3 years but has continued to demonstrate significant hallucinations, paranoia and disorganized behavior (yelling to self) consistent with an underlying primary psychotic disorder. He has had recurrent depressive episodes and SI throughout his adult life, but no known suicide attempts.   Interval events: Slept 7 hours last night. No PRN. Taking scheduled meds. No conerns from nursing. Reported that his goal was to discharge and did not attend the wrap up group.  Per nursing he had a and episode of yelling that was very brief this morning with himself but has been much improved over the last several days they think.  Interview today: Reports that he is not having auditory hallucinations anymore and feels like he is ready to leave.  Says that he has been sleeping better on the Seroquel .  Denies any side effects including sedation emotional numbness. Pt denies extrapyramidal symptoms including  dystonia (sudden spastic contractions of muscle groups), parkinsonism (bradykinesia, tremors, rigidity), and akathisia (severe restlessness).  Reports that he will stay in the [place] area and would like to follow-up with [facility] or similar.  Reports that he will probably go to [facility] at discharge and does not need help with this from social workers.  Says that he can has prescription sent to the [facility] on [address].  Says that he has funds to get his medications.  Reports he has been eating well.  Reports that his mood is stable.  Denies any thoughts to harm self or others.  Feels like his current Seroquel  dose is effective.  Reports he is pacing up and down the hallway for exercise and not because he feels restless.    Past Medical History:  Past Medical History:  Diagnosis Date   Bipolar disorder (HCC)    Cellulitis    Depression    Drug abuse (HCC)    Hepatitis C     Past Surgical History:  Procedure Laterality Date   BACK SURGERY     ROTATOR CUFF REPAIR     SHOULDER SURGERY     Family History:  Family History  Problem Relation Age of Onset   Alcohol  abuse Father    Cancer Maternal Aunt     Social History:  Social History   Substance and Sexual Activity  Alcohol  Use Yes   Comment: 6-7 bottles of wine per day     Social History   Substance and Sexual Activity  Drug Use Not Currently   Types: Marijuana, Benzodiazepines, Other-see comments, Methamphetamines, Cocaine   Comment: denies    Social History   Socioeconomic History   Marital status: Divorced  Spouse name: Not on file   Number of children: 2   Years of education: Not on file   Highest education level: GED or equivalent  Occupational History   Not on file  Tobacco Use   Smoking status: Every Day    Current packs/day: 1.00    Average packs/day: 1 pack/day for 42.8 years (42.8 ttl pk-yrs)    Types: Cigarettes    Start date: 10/22/1981   Smokeless tobacco: Never  Vaping Use    Vaping status: Never Used  Substance and Sexual Activity   Alcohol  use: Yes    Comment: 6-7 bottles of wine per day   Drug use: Not Currently    Types: Marijuana, Benzodiazepines, Other-see comments, Methamphetamines, Cocaine    Comment: denies   Sexual activity: Not Currently  Other Topics Concern   Not on file  Social History Narrative   Not on file   Social Drivers of Health   Financial Resource Strain: High Risk (04/10/2023)   Received from [facility]   Overall Financial Resource Strain (CARDIA)    Difficulty of Paying Living Expenses: Hard  Food Insecurity: Patient Declined (07/23/2024)   Hunger Vital Sign    Worried About Running Out of Food in the Last Year: Patient declined    Ran Out of Food in the Last Year: Patient declined  Transportation Needs: Unmet Transportation Needs (07/23/2024)   PRAPARE - Administrator, Civil Service (Medical): Yes    Lack of Transportation (Non-Medical): No  Physical Activity: Inactive (04/10/2023)   Received from [facility]   Exercise Vital Sign    On average, how many days per week do you engage in moderate to strenuous exercise (like a brisk walk)?: 0 days    On average, how many minutes do you engage in exercise at this level?: 0 min  Stress: Stress Concern Present (04/22/2024)   Received from [facility] - Occupational Stress Questionnaire    Feeling of Stress : Rather much  Social Connections: Unknown (07/23/2024)   Social Connection and Isolation Panel    Frequency of Communication with Friends and Family: Patient declined    Frequency of Social Gatherings with Friends and Family: Patient declined    Attends Religious Services: Patient declined    Database administrator or Organizations: Patient declined    Attends Engineer, structural: Patient declined    Marital Status: Divorced    Current Medications: Current Facility-Administered  Medications  Medication Dose Route Frequency Provider Last Rate Last Admin   acetaminophen  (TYLENOL ) tablet 650 mg  650 mg Oral Q6H PRN Brent, Amanda C, NP       alum & mag hydroxide-simeth (MAALOX/MYLANTA) 200-200-20 MG/5ML suspension 30 mL  30 mL Oral Q4H PRN Brent, Amanda C, NP       haloperidol  (HALDOL ) tablet 5 mg  5 mg Oral TID PRN Brent, Amanda C, NP   5 mg at 07/25/24 1255   And   diphenhydrAMINE  (BENADRYL ) capsule 50 mg  50 mg Oral TID PRN Brent, Amanda C, NP   50 mg at 07/25/24 1255   haloperidol  lactate (HALDOL ) injection 5 mg  5 mg Intramuscular TID PRN Brent, Amanda C, NP       And   diphenhydrAMINE  (BENADRYL ) injection 50 mg  50 mg Intramuscular TID PRN Thresa Alan BROCKS, NP       And   LORazepam  (ATIVAN ) injection 2 mg  2 mg Intramuscular  TID PRN Brent, Amanda C, NP       haloperidol  lactate (HALDOL ) injection 10 mg  10 mg Intramuscular TID PRN Brent, Amanda C, NP       And   diphenhydrAMINE  (BENADRYL ) injection 50 mg  50 mg Intramuscular TID PRN Brent, Amanda C, NP       And   LORazepam  (ATIVAN ) injection 2 mg  2 mg Intramuscular TID PRN Brent, Amanda C, NP       hydrOXYzine  (ATARAX ) tablet 25 mg  25 mg Oral TID PRN Brent, Amanda C, NP   25 mg at 07/26/24 2048   LORazepam  (ATIVAN ) tablet 1 mg  1 mg Oral Q6H PRN Shaquayla Klimas, MD       magnesium  hydroxide (MILK OF MAGNESIA) suspension 30 mL  30 mL Oral Daily PRN Brent, Amanda C, NP   30 mL at 07/26/24 1533   multivitamin with minerals tablet 1 tablet  1 tablet Oral Daily Margalit Leece, MD   1 tablet at 07/27/24 0759   nicotine  (NICODERM CQ  - dosed in mg/24 hours) patch 21 mg  21 mg Transdermal Daily Brent, Amanda C, NP   21 mg at 07/27/24 0759   ondansetron  (ZOFRAN ) tablet 4 mg  4 mg Oral Q8H PRN Brent, Amanda C, NP       QUEtiapine  (SEROQUEL ) tablet 100 mg  100 mg Oral Daily Towana Leita SAILOR, MD   100 mg at 07/27/24 0759   QUEtiapine  (SEROQUEL ) tablet 200 mg  200 mg Oral QHS Butler, Laura N, MD   200 mg at 07/26/24 2048    thiamine  (Vitamin B-1) tablet 100 mg  100 mg Oral Daily Lucita Montoya, MD   100 mg at 07/27/24 0759   traZODone  (DESYREL ) tablet 50 mg  50 mg Oral QHS PRN Brent, Amanda C, NP   50 mg at 07/26/24 2048    Lab Results:  No results found for this or any previous visit (from the past 48 hours).   Blood Alcohol  level:  Lab Results  Component Value Date   ETH <15 07/22/2024   ETH 199 (H) 10/05/2021    Metabolic Disorder Labs: Lab Results  Component Value Date   HGBA1C 5.4 09/22/2021   MPG 108.28 09/22/2021   MPG 117 04/05/2021   No results found for: PROLACTIN Lab Results  Component Value Date   CHOL 170 10/10/2021   TRIG 86 10/10/2021   HDL 64 10/10/2021   CHOLHDL 2.7 10/10/2021   VLDL 17 10/10/2021   LDLCALC 89 10/10/2021   LDLCALC 66 09/22/2021    Physical Findings:   Mental Status exam: Appearance: white male of average BMI, fair grooming, light-colored hair, pacing up and down the hallway Eye contact: good  Attitude towards examiner cooperative, although somewhat withdrawn, possibly guarded  Psychomotor: no agitation or retardation Speech: reduced amount, often one-word responses or short phrases, normal in rate and volume  Language: no delays  Mood: fine  Affect: remains restricted  Thought content: denying SI and HI, no delusions expressed although patient does not volunteer information past what is directly asked  Thought Process: largely linear, somewhat concrete  Perception: denying AVH this morning, not RTIS  Insight: good  Judgement: fair to good   Orientation: x3 Attention/Concentration: fair to good - attends to interview  Memory/Cognition: grossly intact based on conversation   [employer] of Knowledge: Average    Musculoskeletal: Strength & Muscle Tone: within normal limits Gait & Station: normal Patient leans: N/A   Physical Exam Constitutional:  General: He is not in acute distress. HENT:     Head: Normocephalic and atraumatic.  Eyes:      Extraocular Movements: Extraocular movements intact.  Abdominal:     General: There is no distension.  Musculoskeletal:     Cervical back: Normal range of motion.  Psychiatric:     Comments: No EPS    Review of Systems  Constitutional:  Negative for fever.  Cardiovascular:  Negative for chest pain and palpitations.  Gastrointestinal:  Negative for constipation, diarrhea, nausea and vomiting.  Neurological:  Negative for dizziness, weakness and headaches.  Psychiatric/Behavioral:         Pt denies extrapyramidal symptoms including dystonia (sudden spastic contractions of muscle groups), parkinsonism (bradykinesia, tremors, rigidity), and akathisia (severe restlessness).    Blood pressure 115/68, pulse 77, temperature (!) 97.5 F (36.4 C), temperature source Oral, resp. rate 14, height 5' 9.5 (1.765 m), weight 72.6 kg, SpO2 96%. Body mass index is 23.29 kg/m.   Treatment Plan Summary: Daily contact with patient to assess and evaluate symptoms and progress in treatment  Assessment: The patient is a 48 y.o. male with a psychiatric history currently most consistent with schizoaffective disorder, depressed type. He has presented with 20+ years of psychotic symptoms characterized by hallucinations, disorganized thoughts/behaviors, and poor ability to care for self. He has additionally presented with significant depressive symptoms with numerous hospitalizations for psychosis with depression and SI. Although confounded by a significant history of substance use (meth, opioids, alcohol ) the patient has been sober for at least 3 years with continuation of both mood and psychotic symptoms indicating schizoaffective disorder, DT as the most accurate diagnosis. No known history of mania outside of substance use concerns.   Patient is on stable regimen and tolerating without side effects.  Plan to get patient follow-up in the [place] area as it currently is set up for [facility].  Patient  has ability to access medications including pain from them.  He will go to a shelter at discharge.   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   Plan:  Legal Status: -Voluntary   Safety -q15 minute checks  -elopement, suicide and assault precautions  -daily vitals  Psychiatric Concerns  -Continue Seroquel  IR 200 mg once daily at bedtime for schizoaffective - Continue Seroquel  to 100 mg once daily for schizoaffective  - Continue trazodone  50 mg once nightly as needed for insomnia - Continue hydroxyzine  25 mg 3 times daily as needed for anxiety  Substance use concerns  -none currently (sober x3 years per patient; UDS negative this admission)   Nicotine  Replacement  Patch and gum   Medical concerns -No chronic conditions requiring medications   Additional PRNs: -Tylenol  tablets 650 mg every 6 hours as needed for pain -Maalox/Mylanta suspension 30 mL every 4 hours as needed for indigestion  -Milk of Magnesia 30 mL daily as needed for constipation  Labs -Reviewed as documented in HPI  Psychosocial interventions  -daily medication management with psychiatry -Medication education regarding risks/benefits and alternatives -bedside psychotherapy as indicated  -Patient will be encouraged to participate and engage with group therapy  -Appreciate SW assistance in coordinating safe disposition    [name] Cornish, MD 07/27/2024, 1:58 PM

## 2024-07-27 NOTE — Care Management Important Message (Signed)
 Medicare IM was printed and given to social work to give to the patient.

## 2024-07-27 NOTE — Group Note (Signed)
 Date:  07/27/2024 Time:  8:35 PM  Group Topic/Focus:  Wrap-Up Group:   The focus of this group is to help patients review their daily goal of treatment and discuss progress on daily workbooks.    Participation Level:  Did not attend    07/27/2024, 8:35 PM

## 2024-07-27 NOTE — Progress Notes (Signed)
 Recreation Therapy Notes  INPATIENT RECREATION THERAPY ASSESSMENT  Patient Details Name: [name] [name] MRN: [id number] DOB: 1975/12/08 Today's Date: 07/27/2024       Information Obtained From: Patient  Able to Participate in Assessment/Interview: Yes  Patient Presentation: Alert  Reason for Admission (Per Patient): Med Non-Compliance, Suicidal Ideation, Other (Comments) (per chart: paranoid)  Patient Stressors:  (None identified)  Leisure Interests (2+):  Games - Cards, Music - Listen  Frequency of Recreation/Participation: Other (Comment) (every now and then)  Awareness of Community Resources:  Yes  Community Resources:  Park, Public affairs consultant  Current Use: Yes  If no, Barriers?:    Expressed Interest in [employer] Information: No  [employer] of Residence:  Engineer, technical sales  Patient Main Form of Transportation: Therapist, music  Patient Strengths:  I don't know  Patient Identified Areas of Improvement:  No  Patient Goal for Hospitalization:  find out when I'm leaving  Current SI (including self-harm):  No  Current HI:  No  Current AVH: No  Staff Intervention Plan: Group Attendance, Collaborate with Interdisciplinary Treatment Team  Consent to Intern Participation: N/A   Olander Friedl-McCall, LRT,CTRS Flynt Breeze A Devarion Mcclanahan-McCall 07/27/2024, 1:37 PM

## 2024-07-27 NOTE — BH Assessment (Signed)
(  Sleep Hours) - 7 (Any PRNs that were needed, meds refused, or side effects to meds)-  (Any disturbances and when (visitation, over night)- None (Concerns raised by the patient)- None (SI/HI/AVH)- Denies

## 2024-07-28 DIAGNOSIS — F1521 Other stimulant dependence, in remission: Secondary | ICD-10-CM

## 2024-07-28 MED ORDER — QUETIAPINE FUMARATE 200 MG PO TABS: 200.0000 mg | ORAL_TABLET | Freq: Every day | ORAL | 0 refills | Status: DC

## 2024-07-28 MED ORDER — QUETIAPINE FUMARATE 100 MG PO TABS: 100.0000 mg | ORAL_TABLET | Freq: Every day | ORAL | 0 refills | Status: DC

## 2024-07-28 NOTE — Plan of Care (Signed)
Patient attended one recreation therapy group session.  Salah Burlison-McCall, LRT,CTRS 

## 2024-07-28 NOTE — BH Assessment (Signed)
(  Sleep Hours) - 8 (Any PRNs that were needed, meds refused, or side effects to meds)-  (Any disturbances and when (visitation, over night)- None (Concerns raised by the patient)- None (SI/HI/AVH)- Denies

## 2024-07-28 NOTE — Discharge Summary (Signed)
 Physician Discharge Summary Note  Patient:  [name] [name] is an 48 y.o., male MRN:  [id number] DOB:  05/31/1976 Patient phone:  [phone] (home)  Patient address:   [address] [place] [address],  Total Time spent with patient: 45 minutes  Date of Admission:  07/23/2024 Date of Discharge: 07/28/2024  [name] [name] is a 48 y.o. male  with a past psychiatric history of numerous diagnosis including schizoaffective, extensive hospitalizations over last several years including July 2025, no suicide attempts. Patient initially arrived to [facility] on 07/22/2024 for auditory hallucinations, and admitted to [facility] on 07/23/2024 for intensive therapeutic interventions and stabilization of acute on chronic psychiatric conditions.   Principal Problem: Schizoaffective disorder [facility] Discharge Diagnoses: Principal Problem:   Schizoaffective disorder (HCC) Active Problems:   Stimulant use disorder, severe, sustained remission   Past Psychiatric Hx: Current Psychiatrist: none currently, was suppose to follow up with [facility] in [place] after hosp in July. Previously has been to [facility] in [place].  Current Therapist: none Previous Psychiatric Diagnoses: extensive diagnoses in the chart including numerous mood, psychotic, substance. Per pt schizoaffective disorder (type unknown).   Psychiatric Medications: Current Seroquel  per patient. Doesn't know dose Per pharm records, zyprexa  10 mg at bedtime Past Zyprexa  helps sleep but not AH Seroquel  helps sleep and AH  Psychiatric Hospitalization hx: numerous including several this year. Last hospitalization was in July at [facility].  ACT team hx: no history but interested Neuromodulation history: n/a  History of suicide: no attempts but extensive SI per chart History of homicide or aggression: denies   Substance Use Hx: Alcohol : 3 beers once a week (each beer is a tallboy twisted tea though) Tobacco: vaping and cigarettes  daily Cannabis: few hits a week Other Substances: substantial hx of meth, cocaine, opoid use. No use in the last several years.  Non-prescribed medications: n/a Rehab History: yes but not in the last several years   Past Medical History: PCP: none currently Medical Dx: none per patient Meds: none per patient Allergies: n/a Hospitalizations: none, numerous ED visit though Surgeries: n/a TBI: denies Seizures: denies   Family History: family history includes Alcohol  abuse in his father; Cancer in his maternal aunt. No primary psychotic disorder in family. Mother has anxiety.    Social History: Developmental: did not discuss Current Living Situation: b[place] between [place] and [place] and other cities.  Education: did not discuss Occupation: been on disability since early 31s, also on medicare Hobbies: exercise particularly walking Spirituality/religiosity: did not discuss Marital Status / Relationships: not in relationship currently Children: did not discuss Military: denies   Legal: denies any upcoming court dates.  Access to firearms: denies   Hospital course: On admission patient was having insomnia from his auditory hallucinations.  He had been unable to access outpatient resources for which he ran out of medicines and had not been taking them for 6 weeks.  He reported that Seroquel  been helpful for him in the past so this was restarted and slowly titrated from 50 in the morning and 100 at night TO 100 in the morning and 200 at night.  He tolerated this without side effects and had improved sleep and no auditory hallucinations for several days prior to discharge.  Of note he does have random outburst of anger.  He is experiencing homelessness and will stay in the [place] area which he knows well.  He is being set up with [facility] at discharge since staying in [place] area.  [facility]  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  During the patient's hospitalization, patient had extensive initial psychiatric evaluation, and follow-up psychiatric evaluations every day.  Patient's care was discussed during the interdisciplinary team meeting every day during the hospitalization.  The patient is not having side effects to prescribed psychiatric medication.  Gradually, patient started adjusting to milieu. The patient was evaluated each day by a clinical provider to ascertain response to treatment. Improvement was noted by the patient's report of decreasing symptoms, improved sleep and appetite, affect, medication tolerance, behavior, and participation in unit programming.  Patient was asked each day to complete a self inventory noting mood, mental status, pain, new symptoms, anxiety and concerns.   Symptoms were reported as significantly decreased or resolved completely by discharge.  The patient reports that their mood is stable.  The patient denied having suicidal thoughts for more than 48 hours prior to discharge.  Patient denies having homicidal thoughts.  Patient denies having auditory hallucinations.  Patient denies any visual hallucinations or other symptoms of psychosis.  The patient was motivated to continue taking medication with a goal of continued improvement in mental health.   The patient reports their target psychiatric symptoms of auditory hallucinations responded well to the psychiatric medications, and the patient reports overall benefit other psychiatric hospitalization. Supportive psychotherapy was provided to the patient. The patient also participated in regular group therapy while hospitalized. Coping skills, problem solving as well as relaxation therapies were also part of the unit programming.  Labs were reviewed with the patient, and abnormal results  were discussed with the patient.  The patient is able to verbalize their individual safety plan to this provider.  # It is recommended to the patient to continue psychiatric medications as prescribed, after discharge from the hospital.    # It is recommended to the patient to follow up with your outpatient psychiatric provider and PCP.  # It was discussed with the patient, the impact of alcohol , drugs, tobacco have been there overall psychiatric and medical wellbeing, and total abstinence from substance use was recommended the patient.ed.  # Prescriptions provided or sent directly to preferred pharmacy at discharge. Patient agreeable to plan. Given opportunity to ask questions. Appears to feel comfortable with discharge.    # In the event of worsening symptoms, the patient is instructed to call the crisis hotline, 911 and or go to the nearest ED for appropriate evaluation and treatment of symptoms. To follow-up with primary care provider for other medical issues, concerns and or health care needs  # Patient was discharged home with a plan to follow up as noted below.    On day of discharge, he had an anger outburst in the morning hwen nursing wouldn't tell him if he was leaving or not but he was able to be redirected easily by providers. Reports that he has not had the AH in several days. Denies side effects to seroquel . Pt denies extrapyramidal symptoms including dystonia (sudden spastic contractions of muscle groups), parkinsonism (bradykinesia, tremors, rigidity), and akathisia (severe restlessness). Denies SI and HI. Reports his mood and anxiety are stable. [facility]. Says he has means to pick up his meds from [facility]. Reports sleeping and eating well.    Physical Findings: AIMS: Facial and Oral Movements  Muscles of Facial Expression: None Lips and Perioral Area: None Jaw: None Tongue: None,Extremity Movements Upper (arms, wrists, hands, fingers):  None Lower (legs, knees, ankles, toes): None, Trunk Movements Neck, shoulders, hips: None, Global Judgements Severity of abnormal movements overall : None Incapacitation due to abnormal movements: None Patient's awareness of abnormal movements: No Awareness, Dental Status Current problems with teeth and/or dentures?: No Does patient usually wear dentures?: No Edentia?: No, Other Do movements disappear in sleep?: No, AIMS Total Score AIMS Total Score: 0  CIWA:  CIWA-Ar Total: 0   Musculoskeletal: Strength & Muscle Tone: within normal limits Gait & Station: normal Patient leans: N/A   Psychiatric Specialty Exam:  Presentation  General Appearance:  Appropriate for Environment  Eye Contact: Good  Speech: Normal Rate  Speech Volume: Normal    Mood and Affect  Mood: Euthymic  Affect: Appropriate; Congruent; Full Range   Thought Process  Thought Processes: Coherent; Linear  Descriptions of Associations:Intact  Orientation:Full (Time, Place and Person)  Thought Content:Logical  History of Schizophrenia/Schizoaffective disorder: yes for several years at least, probably substance induced from long term meth use  Hallucinations:Hallucinations: None  Ideas of Reference:None  Suicidal Thoughts:Suicidal Thoughts: No  Homicidal Thoughts:Homicidal Thoughts: No   Sensorium  Memory: Immediate Good; Recent Good; Remote Good  Judgment: Good  Insight: Good   Executive Functions  Concentration: Good  Attention Span: Good  Recall: Good  Fund of Knowledge: Good  Language: Good   Psychomotor Activity  Psychomotor Activity: Psychomotor Activity: Normal   Assets  Assets: Desire for Improvement; Housing; Social Support; Vocational/Educational   Sleep  Sleep: Sleep: Good  Estimated Sleeping Duration (Last 24 Hours): 6.50-8.25 hours   Physical Exam: Physical Exam Vitals and nursing note reviewed.  HENT:     Head: Normocephalic and  atraumatic.  Pulmonary:     Effort: Pulmonary effort is normal.  Neurological:     General: No focal deficit present.     Mental Status: He is alert.  Psychiatric:     Comments: No obvious EPS.    Review of Systems  Constitutional:  Negative for fever.  Cardiovascular:  Negative for chest pain and palpitations.  Gastrointestinal:  Negative for constipation, diarrhea, nausea and vomiting.  Neurological:  Negative for dizziness, weakness and headaches.  Psychiatric/Behavioral:         Pt denies extrapyramidal symptoms including dystonia (sudden spastic contractions of muscle groups), parkinsonism (bradykinesia, tremors, rigidity), and akathisia (severe restlessness).    Blood pressure 122/80, pulse 80, temperature 98.4 F (36.9 C), temperature source Oral, resp. rate 14, height 5' 9.5 (1.765 m), weight 72.6 kg, SpO2 96%. Body mass index is 23.29 kg/m.   Social History   Tobacco Use  Smoking Status Every Day   Current packs/day: 1.00   Average packs/day: 1 pack/day for 42.8 years (42.8 ttl pk-yrs)   Types: Cigarettes   Start date: 10/22/1981  Smokeless Tobacco Never   Tobacco Cessation:  A prescription for an FDA-approved tobacco cessation medication was offered at discharge and the patient refused   Blood Alcohol  level:  Lab Results  Component Value Date   ETH <15 07/22/2024   ETH 199 (H) 10/05/2021    Metabolic Disorder Labs:  Lab Results  Component Value Date   HGBA1C 5.4 09/22/2021   MPG 108.28 09/22/2021   MPG 117 04/05/2021   No results found for: PROLACTIN Lab Results  Component Value Date   CHOL 170 10/10/2021   TRIG 86 10/10/2021   HDL 64 10/10/2021   CHOLHDL  2.7 10/10/2021   VLDL 17 10/10/2021   LDLCALC 89 10/10/2021   LDLCALC 66 09/22/2021    See Psychiatric Specialty Exam and Suicide Risk Assessment completed by Attending Physician prior to discharge.  Discharge destination:  [facility] [place]  Is patient on multiple  antipsychotic therapies at discharge:  No     Discharge Instructions     Diet - low sodium heart healthy   Complete by: As directed    Increase activity slowly   Complete by: As directed       Allergies as of 07/28/2024       Reactions   Lithium Anaphylaxis   Penicillins Rash   Has patient had a PCN reaction causing immediate rash, facial/tongue/throat swelling, SOB or lightheadedness with hypotension: Yes Has patient had a PCN reaction causing severe rash involving mucus membranes or skin necrosis: No Has patient had a PCN reaction that required hospitalization: No Has patient had a PCN reaction occurring within the last 10 years: No If all of the above answers are NO, then may proceed with Cephalosporin use.        Medication List     STOP taking these medications    cloNIDine  0.1 MG tablet Commonly known as: CATAPRES    gabapentin  300 MG capsule Commonly known as: NEURONTIN    multivitamin with minerals Tabs tablet   nicotine  21 mg/24hr patch Commonly known as: NICODERM CQ  - dosed in mg/24 hours   OLANZapine  10 MG tablet Commonly known as: ZYPREXA    sertraline  100 MG tablet Commonly known as: ZOLOFT    thiamine  100 MG tablet Commonly known as: VITAMIN B1   traZODone  100 MG tablet Commonly known as: DESYREL        TAKE these medications      Indication  QUEtiapine  200 MG tablet Commonly known as: SEROQUEL  Take 1 tablet (200 mg total) by mouth at bedtime.  Indication: Schizophrenia   QUEtiapine  100 MG tablet Commonly known as: SEROQUEL  Take 1 tablet (100 mg total) by mouth daily. Start taking on: July 29, 2024  Indication: Schizophrenia        Follow-up Information     [facility] Follow up on 07/29/2024.   Why: Please call this provider on 07/29/24 at 9:00 am, once you have obtained a phone number. Please schedule a hospital follow up appointment for therapy and medication management services. Contact information: [address]  [address] [place] [address] [phone]         [facility] Follow up.   Specialty: Urgent Care Why: if mental health acute issues, OPEN 24/7 Contact information: [address] [place]  [address] [phone]                Follow-up recommendations:   Activity: as tolerated   Diet: heart healthy   Other: -Follow-up with your outpatient psychiatric provider -instructions on appointment date, time, and address (location) are provided to you in discharge paperwork.   -Take your psychiatric medications as prescribed at discharge - instructions are provided to you in the discharge paperwork   -Follow-up with outpatient primary care doctor and other specialists -for management of chronic medical disease, including: none   -Testing: Follow-up with outpatient provider for abnormal lab results: none   -Recommend abstinence from alcohol , tobacco, and other illicit drug use at discharge.    -If your psychiatric symptoms recur, worsen, or if you have side effects to your psychiatric medications, call your outpatient psychiatric provider, 911, 988 or go  to the nearest emergency department.   -If suicidal thoughts recur, call your outpatient psychiatric provider, 911, 988 or go to the nearest emergency department.    Signed: Justino Cornish, MD 07/28/2024, 12:18 PM

## 2024-07-28 NOTE — Progress Notes (Signed)
 Recreation Therapy Notes  INPATIENT RECREATION TR PLAN  Patient Details Name: [name] [name] MRN: [id number] DOB: 07-08-1976 Today's Date: 07/28/2024  Rec Therapy Plan Is patient appropriate for Therapeutic Recreation?: Yes Treatment times per week: about 3 days Estimated Length of Stay: 5-7 days TR Treatment/Interventions: Group participation (Comment)  Discharge Criteria Pt will be discharged from therapy if:: Discharged Treatment plan/goals/alternatives discussed and agreed upon by:: Patient/family  Discharge Summary Short term goals set: See patient care plan Short term goals met: Not met Progress toward goals comments: Groups attended Which groups?: Coping skills Reason goals not met: Pt attended one group session. Therapeutic equipment acquired: N/A Reason patient discharged from therapy: Discharge from hospital Pt/family agrees with progress & goals achieved: Yes Date patient discharged from therapy: 07/28/24   [name] [name], LRT,CTRS Jeanell Mangan A Liridona Mashaw-McCall 07/28/2024, 4:24 PM

## 2024-07-28 NOTE — Plan of Care (Signed)
   Problem: Education: Goal: Knowledge of [facility] Education information/materials will improve Outcome: Progressing Goal: Emotional status will improve Outcome: Progressing Goal: Mental status will improve Outcome: Progressing Goal: Verbalization of understanding the information provided will improve Outcome: Progressing

## 2024-07-28 NOTE — [facility] Suicide Risk Assessment (Signed)
 Suicide Risk Assessment  Discharge Assessment    [facility] Discharge Suicide Risk Assessment   Principal Problem: Schizoaffective disorder [facility]) Discharge Diagnoses: Principal Problem:   Schizoaffective disorder (HCC) Active Problems:   Stimulant use disorder, severe, sustained remission  [name] [name] is a 48 y.o. male  with a past psychiatric history of numerous diagnosis including schizoaffective, extensive hospitalizations over last several years including July 2025, no suicide attempts. Patient initially arrived to [facility] on 07/22/2024 for auditory hallucinations, and admitted to [facility] on 07/23/2024 for intensive therapeutic interventions and stabilization of acute on chronic psychiatric conditions.   On day of discharge, he had an anger outburst in the morning hwen nursing wouldn't tell him if he was leaving or not but he was able to be redirected easily by providers. Reports that he has not had the AH in several days. Denies side effects to seroquel . Pt denies extrapyramidal symptoms including dystonia (sudden spastic contractions of muscle groups), parkinsonism (bradykinesia, tremors, rigidity), and akathisia (severe restlessness). Denies SI and HI. Reports his mood and anxiety are stable. [facility]. Says he has means to pick up his meds from [facility]. Reports sleeping and eating well.   Total Time spent with patient: 45 minutes  Musculoskeletal: Strength & Muscle Tone: within normal limits Gait & Station: normal Patient leans: N/A  Psychiatric Specialty Exam  Presentation  General Appearance:  Appropriate for Environment  Eye Contact: Good  Speech: Normal Rate  Speech Volume: Normal    Mood and Affect  Mood: Euthymic   Affect: Appropriate; Congruent; Full Range   Thought Process  Thought Processes: Coherent; Linear  Descriptions of Associations:Intact  Orientation:Full (Time, Place and Person)  Thought  Content:Logical   Hallucinations:Hallucinations: None  Ideas of Reference:None  Suicidal Thoughts:Suicidal Thoughts: No  Homicidal Thoughts:Homicidal Thoughts: No   Sensorium  Memory: Immediate Good; Recent Good; Remote Good  Judgment: Good  Insight: Good   Executive Functions  Concentration: Good  Attention Span: Good  Recall: Good  Fund of Knowledge: Good  Language: Good   Psychomotor Activity  Psychomotor Activity:Psychomotor Activity: Normal   Assets  Assets: Desire for Improvement; Housing; Social Support; Vocational/Educational   Sleep  Sleep:Sleep: Good  Estimated Sleeping Duration (Last 24 Hours): 6.50-8.25 hours  Physical Exam: Physical Exam Vitals and nursing note reviewed.  HENT:     Head: Normocephalic and atraumatic.  Pulmonary:     Effort: Pulmonary effort is normal.  Neurological:     General: No focal deficit present.     Mental Status: He is alert.  Psychiatric:     Comments: No obvious EPS.    Review of Systems  Constitutional:  Negative for fever.  Cardiovascular:  Negative for chest pain and palpitations.  Gastrointestinal:  Negative for constipation, diarrhea, nausea and vomiting.  Neurological:  Negative for dizziness, weakness and headaches.  Psychiatric/Behavioral:         Pt denies extrapyramidal symptoms including dystonia (sudden spastic contractions of muscle groups), parkinsonism (bradykinesia, tremors, rigidity), and akathisia (severe restlessness).    Blood pressure 122/80, pulse 80, temperature 98.4 F (36.9 C), temperature source Oral, resp. rate 14, height 5' 9.5 (1.765 m), weight 72.6 kg, SpO2 96%. Body mass index is 23.29 kg/m.  Mental Status Per Nursing Assessment::   On Admission:  NA  Demographic Factors:  Male, Caucasian, and Unemployed  Loss Factors: NA  Historical Factors: NA  Risk Reduction Factors:   Positive therapeutic relationship and Positive coping skills or problem solving  skills  Continued Clinical Symptoms:  Mood is stable. Anxiety at a manageable level. Denying any SI including passive SI.   Cognitive Features That Contribute To Risk:  None    Suicide Risk:  Mild:  There are no identifiable suicide plans, no associated intent, mild dysphoria and related symptoms, good self-control (both objective and subjective assessment), few other risk factors, and identifiable protective factors, including available and accessible social support.    Follow-up Information     Services, [name]. Go to.   Why: You may go to this provider for therapy and medication management services.   * Address:  [address] [place], [place]. Contact information: [address] [place] [address] [phone]         [facility], [name]. Go to.   Why: You may go to this provider for therapy and medication management services.  They are open 24/7. Contact information: [address] [place] [address] [phone]         [facility] Follow up on 07/29/2024.   Why: Please call this provider on 07/29/24 at 9:00 am, once you have obtained a phone number. Please schedule a hospital follow up appointment for therapy and medication management services. Contact information: [address]  [address] [place] [address] [phone]                 Plan Of Care/Follow-up recommendations:  Activity: as tolerated  Diet: heart healthy  Other: -Follow-up with your outpatient psychiatric provider -instructions on appointment date, time, and address (location) are provided to you in discharge paperwork.  -Take your psychiatric medications as prescribed at discharge - instructions are provided to you in the discharge paperwork  -Follow-up with outpatient primary care doctor and other specialists -for management of chronic medical disease, including: none  -Testing: Follow-up with outpatient provider for abnormal  lab results: none  -Recommend abstinence from alcohol , tobacco, and other illicit drug use at discharge.   -If your psychiatric symptoms recur, worsen, or if you have side effects to your psychiatric medications, call your outpatient psychiatric provider, 911, 988 or go to the nearest emergency department.  -If suicidal thoughts recur, call your outpatient psychiatric provider, 911, 988 or go to the nearest emergency department.   Justino Cornish, MD 07/28/2024, 10:35 AM

## 2024-07-28 NOTE — Progress Notes (Signed)
 D: Pt A & O X 3. Denies SI, HI, AVH and pain at this time. D/C home as ordered. Taxi voucher given for transportation. A: D/C instructions reviewed with pt including prescriptions, medication samples and follow up appointment; compliance encouraged. All belongings from locker 38 returned to pt at time of departure. Scheduled medications given with verbal education and effects monitored. Safety checks maintained without incident till time of d/c.  R: Pt receptive to care. Compliant with medications when offered. Denies adverse drug reactions when assessed. Verbalized understanding related to d/c instructions. Signed belonging sheet in agreement with items received from locker. Ambulatory with a steady gait. Appears to be in no physical distress at time of departure.

## 2024-07-28 NOTE — Progress Notes (Signed)
  [facility] Adult Case Management Discharge Plan :  Will you be returning to the same living situation after discharge:  Yes,  pt returning to shelter after discharge.  At discharge, do you have transportation home?: Yes,  pt will be transported home via taxi voucher. Do you have the ability to pay for your medications: Yes,  pt is insured - Medicare Part A & B.  Release of information consent forms completed and in the chart;  Patient's signature needed at discharge.  Patient to Follow up at:  Follow-up Information     Services, [name]. Go to.   Why: You may go to this provider for therapy and medication management services.   * Address:  [address] [place], [place]. Contact information: [address] [place] [address] [phone]         Inc, [name]. Go to.   Why: You may go to this provider for therapy and medication management services.  They are open 24/7. Contact information: [address] [place]annie [place] [address] [phone]         [facility] Follow up on 07/29/2024.   Why: Please call this provider on 07/29/24 at 9:00 am, once you have obtained a phone number. Please schedule a hospital follow up appointment for therapy and medication management services. Contact information: [address]  [address] [place] [address] [phone]                 Next level of care provider has access to [facility] Link:no  Safety Planning and Suicide Prevention discussed: Yes,  completed with patient as he did not provide consent to speak with family/friends.      Has patient been referred to the Quitline?: Patient refused referral for treatment  Patient has been referred for addiction treatment: Patient refused referral for treatment.  Brysan Mcevoy M Euan Wandler, LCSWA 07/28/2024, 10:17 AM

## 2024-07-28 NOTE — Discharge Instructions (Signed)
-  Follow-up with your outpatient psychiatric provider (and therapist) -instructions on appointment date, time, and address (location) are provided to you in discharge paperwork.  -Take your psychiatric medications as prescribed at discharge - instructions are provided to you in the discharge paperwork  -Follow-up with outpatient primary care doctor and other specialists -for management of preventative medicine and any chronic medical disease.  -Recommend abstinence from alcohol, tobacco, cannabis, and other substances at discharge.   -If your psychiatric symptoms recur, worsen, or if you have severe side effects to your psychiatric medications, call your outpatient psychiatric provider, 911, 988 (national suicide hotline), go to [facility], or go to the nearest emergency department.  -If suicidal thoughts occur, call your outpatient psychiatric provider, 911, 988 (national suicide hotline), go to [facility], or go to the nearest emergency department.  Naloxone (Narcan) can help reverse an overdose when given to the victim quickly.  [facility] offers free naloxone kits and instructions/training on its use.  Add naloxone to your first aid kit and you can help save a life.   Pick up your free kit at the following locations:   [place]:  [facility], [address] [place] [address] [phone]) [facility] [address] [place] [address] [phone]) [facility] [address] [place] [address]  [place]: [facility] [address] [place] [address] ([phone]) [facility] [address] [place] [address] [phone])

## 2024-07-28 NOTE — [facility] Suicide Risk Assessment (Signed)
 [facility] INPATIENT:  Family/Significant Other Suicide Prevention Education  Suicide Prevention Education:  Patient Refusal for Family/Significant Other Suicide Prevention Education: The patient [name] [name] has refused to provide written consent for family/significant other to be provided Family/Significant Other Suicide Prevention Education during admission and/or prior to discharge.  Physician notified.  Terriana Barreras M Gildardo Tickner 07/28/2024, 10:20 AM

## 2024-08-03 ENCOUNTER — Encounter (HOSPITAL_COMMUNITY): Payer: Self-pay

## 2024-08-03 ENCOUNTER — Emergency Department (HOSPITAL_COMMUNITY)
Admission: EM | Admit: 2024-08-03 | Discharge: 2024-08-04 | Disposition: A | Discharge status: Other Acute Inpatient Hospital | Source: Home / Self Care | Attending: Emergency Medicine | Admitting: Emergency Medicine

## 2024-08-03 ENCOUNTER — Other Ambulatory Visit: Payer: Self-pay

## 2024-08-03 DIAGNOSIS — F1010 Alcohol abuse, uncomplicated: Secondary | ICD-10-CM | POA: Insufficient documentation

## 2024-08-03 DIAGNOSIS — F29 Unspecified psychosis not due to a substance or known physiological condition: Secondary | ICD-10-CM | POA: Insufficient documentation

## 2024-08-03 DIAGNOSIS — F251 Schizoaffective disorder, depressive type: Secondary | ICD-10-CM | POA: Diagnosis not present

## 2024-08-03 DIAGNOSIS — F10932 Alcohol use, unspecified with withdrawal with perceptual disturbance: Secondary | ICD-10-CM | POA: Diagnosis present

## 2024-08-03 DIAGNOSIS — F1020 Alcohol dependence, uncomplicated: Secondary | ICD-10-CM | POA: Diagnosis not present

## 2024-08-03 DIAGNOSIS — Y904 Blood alcohol level of 80-99 mg/100 ml: Secondary | ICD-10-CM | POA: Insufficient documentation

## 2024-08-03 DIAGNOSIS — R45851 Suicidal ideations: Secondary | ICD-10-CM | POA: Diagnosis not present

## 2024-08-03 DIAGNOSIS — F10232 Alcohol dependence with withdrawal with perceptual disturbance: Secondary | ICD-10-CM | POA: Diagnosis not present

## 2024-08-03 LAB — COMPREHENSIVE METABOLIC PANEL WITH GFR
ALT: 58 U/L — ABNORMAL HIGH (ref 0–44)
AST: 66 U/L — ABNORMAL HIGH (ref 15–41)
Albumin: 4.2 g/dL (ref 3.5–5.0)
Alkaline Phosphatase: 78 U/L (ref 38–126)
Anion gap: 12 (ref 5–15)
BUN: 9 mg/dL (ref 6–20)
CO2: 27 mmol/L (ref 22–32)
Calcium: 9.3 mg/dL (ref 8.9–10.3)
Chloride: 101 mmol/L (ref 98–111)
Creatinine, Ser: 0.64 mg/dL (ref 0.61–1.24)
GFR, Estimated: >60 mL/min (ref >60)
Glucose, Bld: 103 mg/dL — ABNORMAL HIGH (ref 70–99)
Potassium: 3.9 mmol/L (ref 3.5–5.1)
Sodium: 140 mmol/L (ref 135–145)
Total Bilirubin: 0.4 mg/dL (ref 0.0–1.2)
Total Protein: 6.9 g/dL (ref 6.5–8.1)

## 2024-08-03 LAB — CBC
HCT: 43.5 % (ref 39.0–52.0)
Hemoglobin: 14.3 g/dL (ref 13.0–17.0)
MCH: 30.8 pg (ref 26.0–34.0)
MCHC: 32.9 g/dL (ref 30.0–36.0)
MCV: 93.5 fL (ref 80.0–100.0)
Platelets: 390 K/uL (ref 150–400)
RBC: 4.65 MIL/uL (ref 4.22–5.81)
RDW: 12.9 % (ref 11.5–15.5)
WBC: 11.1 K/uL — ABNORMAL HIGH (ref 4.0–10.5)
nRBC: 0 % (ref 0.0–0.2)

## 2024-08-03 LAB — ETHANOL: Alcohol, Ethyl (B): 87 mg/dL — ABNORMAL HIGH (ref <15)

## 2024-08-03 MED ORDER — LORAZEPAM 1 MG PO TABS: 1.0000 mg | ORAL_TABLET | Freq: Once | ORAL | Status: DC

## 2024-08-03 MED ORDER — LORAZEPAM 2 MG/ML IJ SOLN
1.0000 mg | INTRAMUSCULAR | Status: DC | PRN
  Administered 2024-08-04: 2 mg via INTRAVENOUS
  Filled 2024-08-03: qty 1

## 2024-08-03 MED ORDER — THIAMINE MONONITRATE 100 MG PO TABS
100.0000 mg | ORAL_TABLET | Freq: Every day | ORAL | Status: DC
  Administered 2024-08-04 (×2): 100 mg via ORAL
  Filled 2024-08-03 (×2): qty 1

## 2024-08-03 MED ORDER — LORAZEPAM 1 MG PO TABS
1.0000 mg | ORAL_TABLET | ORAL | Status: DC | PRN
  Administered 2024-08-04: 2 mg via ORAL
  Administered 2024-08-04: 1 mg via ORAL
  Filled 2024-08-03: qty 1
  Filled 2024-08-03: qty 2

## 2024-08-03 MED ORDER — FOLIC ACID 1 MG PO TABS
1.0000 mg | ORAL_TABLET | Freq: Every day | ORAL | Status: DC
  Administered 2024-08-04 (×2): 1 mg via ORAL
  Filled 2024-08-03 (×2): qty 1

## 2024-08-03 MED ORDER — THIAMINE HCL 100 MG/ML IJ SOLN: 100.0000 mg | Freq: Every day | INTRAMUSCULAR | Status: DC

## 2024-08-03 MED ORDER — LORAZEPAM 2 MG/ML IJ SOLN
2.0000 mg | Freq: Once | INTRAMUSCULAR | Status: AC
  Administered 2024-08-03: 2 mg via INTRAVENOUS
  Filled 2024-08-03: qty 1

## 2024-08-03 MED ORDER — ADULT MULTIVITAMIN W/MINERALS CH
1.0000 | ORAL_TABLET | Freq: Every day | ORAL | Status: DC
  Administered 2024-08-04 (×2): 1 via ORAL
  Filled 2024-08-03 (×2): qty 1

## 2024-08-03 NOTE — ED Triage Notes (Signed)
 Patient has been drinking 1/2 gallon of vodka a day for the past week. Stated he has hallucinations and feels shaky. Has been dry heaving. Last drink was this morning at 8am. History of withdrawal seizures. Wants to kill himself if he cannot stop drinking.

## 2024-08-03 NOTE — ED Notes (Signed)
 Can not dress patient out due to aggressive behavior. Security at bedside

## 2024-08-03 NOTE — ED Provider Notes (Signed)
 [facility] AT [facility] Provider Note   CSN: [id number] Arrival date & time: 08/03/24  1831     Patient presents with: Alcohol  Problem   [name] [name] is a 48 y.o. male.   Patient reports that he has been drinking a large amount of alcohol .  Patient reports he has been drinking a half a gallon a day.  Patient states that he has been drinking today but not as much as he has been over the past week.  Patient is worried that he is having withdrawal.  Patient states he has had some hallucinations.  Patient states he has had thoughts of harming himself.  Patient is requesting help to stop drinking and to stop the hallucinations.  Patient states that he has had thoughts of killing himself if he cannot stop drinking.  The history is provided by the patient. No language interpreter was used.  Alcohol  Problem       Prior to Admission medications   Medication Sig Start Date End Date Taking? Authorizing Provider  QUEtiapine  (SEROQUEL ) 100 MG tablet Take 1 tablet (100 mg total) by mouth daily. 07/29/24   Cornelius Dines, MD  QUEtiapine  (SEROQUEL ) 200 MG tablet Take 1 tablet (200 mg total) by mouth at bedtime. 07/28/24   Cornelius Dines, MD    Allergies: Lithium and Penicillins    Review of Systems  All other systems reviewed and are negative.   Updated Vital Signs BP (!) 154/97 (BP Location: Left Arm)   Pulse 92   Temp 98.3 F (36.8 C) (Oral)   Resp 20   Ht 5' 9 (1.753 m)   Wt 72.6 kg   SpO2 98%   BMI 23.63 kg/m   Physical Exam Vitals and nursing note reviewed.  Constitutional:      Appearance: He is well-developed.  HENT:     Head: Normocephalic.     Mouth/Throat:     Mouth: Mucous membranes are moist.  Eyes:     Extraocular Movements: Extraocular movements intact.     Pupils: Pupils are equal, round, and reactive to light.  Cardiovascular:     Rate and Rhythm: Normal rate.  Pulmonary:     Effort: Pulmonary effort is normal.   Abdominal:     General: There is no distension.  Musculoskeletal:        General: Normal range of motion.     Cervical back: Normal range of motion.  Skin:    General: Skin is warm.  Neurological:     General: No focal deficit present.     Mental Status: He is alert and oriented to person, place, and time.  Psychiatric:     Comments: Patient able to talk to me and give me history.  Approximately 20 minutes after my evaluation patient began yelling that he has demons on him and that he needs medication.  Patient given Ativan  2 mg IV.     (all labs ordered are listed, but only abnormal results are displayed) Labs Reviewed  CBC - Abnormal; Notable for the following components:      Result Value   WBC 11.1 (*)    All other components within normal limits  COMPREHENSIVE METABOLIC PANEL WITH GFR  ETHANOL  URINE DRUG SCREEN    EKG: None  Radiology: No results found.   Procedures   Medications Ordered in the ED  LORazepam  (ATIVAN ) injection 2 mg (has no administration in time range)  Medical Decision Making Patient reports that he has been drinking heavily recently.  Patient states that he drank less today.  He states he is trying to stop drinking but feels shaky.  Patient states he has had thoughts of killing himself today because he cannot stop drinking. Pt seen here and evaluated for the same previously  Amount and/or Complexity of Data Reviewed Labs: ordered. Decision-making details documented in ED Course.    Details: Patient's EtOH is 87.  White blood cell count is 11.1. Discussion of management or test interpretation with external provider(s): TTS consult  Risk Prescription drug management. Risk Details: After my evaluation patient began yelling and stating that he needed medication because he had demons on him.  Patient was given Ativan  2 mg IV.  Patient is now sleeping soundly.  TTS will be consulted for further  evaluation.        Final diagnoses:  Psychosis, unspecified psychosis type [facility])  Alcohol  abuse    ED Discharge Orders     None          Duong Haydel K, PA-C 08/03/24 2140    Cottie Donnice PARAS, MD 08/03/24 2213

## 2024-08-03 NOTE — ED Notes (Signed)
 ED Provider at bedside.

## 2024-08-03 NOTE — ED Notes (Signed)
 Security at bedside

## 2024-08-04 ENCOUNTER — Other Ambulatory Visit: Payer: Self-pay

## 2024-08-04 ENCOUNTER — Encounter (HOSPITAL_COMMUNITY): Payer: Self-pay | Admitting: Psychiatry

## 2024-08-04 ENCOUNTER — Inpatient Hospital Stay (HOSPITAL_COMMUNITY)
Admission: AD | Admit: 2024-08-04 | Discharge: 2024-08-11 | DRG: 897 | Disposition: A | Discharge status: Home / Self Care | Source: Intra-hospital | Attending: Student in an Organized Health Care Education/Training Program | Admitting: Student in an Organized Health Care Education/Training Program

## 2024-08-04 DIAGNOSIS — F4310 Post-traumatic stress disorder, unspecified: Secondary | ICD-10-CM | POA: Diagnosis present

## 2024-08-04 DIAGNOSIS — F1020 Alcohol dependence, uncomplicated: Secondary | ICD-10-CM

## 2024-08-04 DIAGNOSIS — Z59868 Other specified financial insecurity: Secondary | ICD-10-CM

## 2024-08-04 DIAGNOSIS — Y904 Blood alcohol level of 80-99 mg/100 ml: Secondary | ICD-10-CM | POA: Diagnosis present

## 2024-08-04 DIAGNOSIS — F10232 Alcohol dependence with withdrawal with perceptual disturbance: Principal | ICD-10-CM | POA: Diagnosis present

## 2024-08-04 DIAGNOSIS — Z888 Allergy status to other drugs, medicaments and biological substances status: Secondary | ICD-10-CM | POA: Diagnosis not present

## 2024-08-04 DIAGNOSIS — F411 Generalized anxiety disorder: Secondary | ICD-10-CM | POA: Diagnosis present

## 2024-08-04 DIAGNOSIS — F1511 Other stimulant abuse, in remission: Secondary | ICD-10-CM | POA: Diagnosis present

## 2024-08-04 DIAGNOSIS — Z91148 Patient's other noncompliance with medication regimen for other reason: Secondary | ICD-10-CM | POA: Diagnosis not present

## 2024-08-04 DIAGNOSIS — Z880 Allergy status to penicillin: Secondary | ICD-10-CM

## 2024-08-04 DIAGNOSIS — R45851 Suicidal ideations: Secondary | ICD-10-CM | POA: Diagnosis present

## 2024-08-04 DIAGNOSIS — F251 Schizoaffective disorder, depressive type: Secondary | ICD-10-CM | POA: Diagnosis present

## 2024-08-04 DIAGNOSIS — F10932 Alcohol use, unspecified with withdrawal with perceptual disturbance: Principal | ICD-10-CM | POA: Diagnosis present

## 2024-08-04 DIAGNOSIS — F17210 Nicotine dependence, cigarettes, uncomplicated: Secondary | ICD-10-CM | POA: Diagnosis present

## 2024-08-04 DIAGNOSIS — Z5982 Transportation insecurity: Secondary | ICD-10-CM | POA: Diagnosis not present

## 2024-08-04 DIAGNOSIS — Z811 Family history of alcohol abuse and dependence: Secondary | ICD-10-CM

## 2024-08-04 DIAGNOSIS — F329 Major depressive disorder, single episode, unspecified: Principal | ICD-10-CM | POA: Diagnosis present

## 2024-08-04 DIAGNOSIS — Z79899 Other long term (current) drug therapy: Secondary | ICD-10-CM

## 2024-08-04 DIAGNOSIS — Z5900 Homelessness unspecified: Secondary | ICD-10-CM | POA: Diagnosis not present

## 2024-08-04 LAB — URINE DRUG SCREEN
Amphetamines: NEGATIVE
Barbiturates: NEGATIVE
Benzodiazepines: POSITIVE — AB
Cocaine: NEGATIVE
Fentanyl: NEGATIVE
Methadone Scn, Ur: NEGATIVE
Opiates: NEGATIVE
Tetrahydrocannabinol: POSITIVE — AB

## 2024-08-04 MED ORDER — ALUM & MAG HYDROXIDE-SIMETH 200-200-20 MG/5ML PO SUSP: 30.0000 mL | ORAL | Status: DC | PRN

## 2024-08-04 MED ORDER — LORAZEPAM 1 MG PO TABS: 1.0000 mg | ORAL_TABLET | ORAL | Status: DC | PRN

## 2024-08-04 MED ORDER — OLANZAPINE 5 MG PO TBDP: 5.0000 mg | ORAL_TABLET | Freq: Two times a day (BID) | ORAL | Status: DC | PRN

## 2024-08-04 MED ORDER — DIPHENHYDRAMINE HCL 25 MG PO CAPS
50.0000 mg | ORAL_CAPSULE | Freq: Three times a day (TID) | ORAL | Status: DC | PRN
  Administered 2024-08-04 – 2024-08-09 (×4): 50 mg via ORAL
  Filled 2024-08-04 (×4): qty 2

## 2024-08-04 MED ORDER — DIPHENHYDRAMINE HCL 50 MG/ML IJ SOLN
50.0000 mg | Freq: Three times a day (TID) | INTRAMUSCULAR | Status: DC | PRN
  Administered 2024-08-06: 50 mg via INTRAMUSCULAR

## 2024-08-04 MED ORDER — DIPHENHYDRAMINE HCL 25 MG PO CAPS: 50.0000 mg | ORAL_CAPSULE | Freq: Four times a day (QID) | ORAL | Status: DC | PRN

## 2024-08-04 MED ORDER — MAGNESIUM HYDROXIDE 400 MG/5ML PO SUSP: 30.0000 mL | Freq: Every day | ORAL | Status: DC | PRN

## 2024-08-04 MED ORDER — LORAZEPAM 1 MG PO TABS
1.0000 mg | ORAL_TABLET | Freq: Four times a day (QID) | ORAL | Status: AC | PRN
  Administered 2024-08-04 – 2024-08-06 (×4): 1 mg via ORAL
  Filled 2024-08-04 (×3): qty 1

## 2024-08-04 MED ORDER — ADULT MULTIVITAMIN W/MINERALS CH
1.0000 | ORAL_TABLET | Freq: Every day | ORAL | Status: DC
  Administered 2024-08-05 – 2024-08-11 (×7): 1 via ORAL
  Filled 2024-08-04 (×5): qty 1
  Filled 2024-08-04: qty 7
  Filled 2024-08-04 (×2): qty 1

## 2024-08-04 MED ORDER — LOPERAMIDE HCL 2 MG PO CAPS: 2.0000 mg | ORAL_CAPSULE | ORAL | Status: AC | PRN

## 2024-08-04 MED ORDER — LORAZEPAM 2 MG/ML IJ SOLN
2.0000 mg | Freq: Three times a day (TID) | INTRAMUSCULAR | Status: DC | PRN
  Filled 2024-08-04: qty 1

## 2024-08-04 MED ORDER — THIAMINE HCL 100 MG/ML IJ SOLN: 100.0000 mg | Freq: Once | INTRAMUSCULAR | Status: DC

## 2024-08-04 MED ORDER — HALOPERIDOL 5 MG PO TABS
5.0000 mg | ORAL_TABLET | Freq: Three times a day (TID) | ORAL | Status: DC | PRN
  Administered 2024-08-04 – 2024-08-09 (×4): 5 mg via ORAL
  Filled 2024-08-04 (×4): qty 1

## 2024-08-04 MED ORDER — NICOTINE 14 MG/24HR TD PT24
14.0000 mg | MEDICATED_PATCH | Freq: Every day | TRANSDERMAL | Status: DC
Start: 2024-08-04 — End: 2024-08-11
  Administered 2024-08-04 – 2024-08-11 (×8): 14 mg via TRANSDERMAL
  Filled 2024-08-04: qty 7
  Filled 2024-08-04 (×8): qty 1

## 2024-08-04 MED ORDER — HALOPERIDOL LACTATE 5 MG/ML IJ SOLN: 10.0000 mg | Freq: Three times a day (TID) | INTRAMUSCULAR | Status: DC | PRN

## 2024-08-04 MED ORDER — VITAMIN B-1 100 MG PO TABS
100.0000 mg | ORAL_TABLET | Freq: Every day | ORAL | Status: DC
  Administered 2024-08-05 – 2024-08-11 (×7): 100 mg via ORAL
  Filled 2024-08-04 (×5): qty 1
  Filled 2024-08-04: qty 7
  Filled 2024-08-04 (×2): qty 1

## 2024-08-04 MED ORDER — ONDANSETRON 4 MG PO TBDP
4.0000 mg | ORAL_TABLET | Freq: Four times a day (QID) | ORAL | Status: AC | PRN
  Administered 2024-08-05 (×2): 4 mg via ORAL
  Filled 2024-08-04 (×2): qty 1

## 2024-08-04 MED ORDER — LORAZEPAM 2 MG/ML IJ SOLN: 2.0000 mg | Freq: Three times a day (TID) | INTRAMUSCULAR | Status: DC | PRN

## 2024-08-04 MED ORDER — LORAZEPAM 2 MG/ML IJ SOLN: 1.0000 mg | INTRAMUSCULAR | Status: DC | PRN

## 2024-08-04 MED ORDER — HYDROXYZINE HCL 25 MG PO TABS
25.0000 mg | ORAL_TABLET | Freq: Four times a day (QID) | ORAL | Status: AC | PRN
  Administered 2024-08-05 – 2024-08-07 (×4): 25 mg via ORAL
  Filled 2024-08-04 (×4): qty 1

## 2024-08-04 MED ORDER — HALOPERIDOL LACTATE 5 MG/ML IJ SOLN
5.0000 mg | Freq: Three times a day (TID) | INTRAMUSCULAR | Status: DC | PRN
  Administered 2024-08-06: 5 mg via INTRAMUSCULAR
  Filled 2024-08-04: qty 1

## 2024-08-04 MED ORDER — ACETAMINOPHEN 325 MG PO TABS
650.0000 mg | ORAL_TABLET | Freq: Four times a day (QID) | ORAL | Status: DC | PRN
  Administered 2024-08-05 – 2024-08-07 (×3): 650 mg via ORAL
  Filled 2024-08-04 (×3): qty 2

## 2024-08-04 MED ORDER — DIPHENHYDRAMINE HCL 50 MG/ML IJ SOLN
50.0000 mg | Freq: Three times a day (TID) | INTRAMUSCULAR | Status: DC | PRN
  Filled 2024-08-04: qty 1

## 2024-08-04 MED ORDER — ADULT MULTIVITAMIN W/MINERALS CH: 1.0000 | ORAL_TABLET | Freq: Every day | ORAL | Status: DC

## 2024-08-04 NOTE — ED Provider Notes (Signed)
  Physical Exam  BP (!) 138/94 (BP Location: Left Arm)   Pulse 94   Temp (!) 97.5 F (36.4 C)   Resp 16   Ht 5' 9 (1.753 m)   Wt 72.6 kg   SpO2 98%   BMI 23.63 kg/m   Physical Exam Vitals and nursing note reviewed.  Constitutional:      General: He is not in acute distress.    Appearance: He is not toxic-appearing.  HENT:     Head: Normocephalic and atraumatic.  Pulmonary:     Effort: No respiratory distress.  Skin:    Coloration: Skin is not jaundiced or pale.  Neurological:     Mental Status: He is alert and oriented to person, place, and time.  Psychiatric:        Behavior: Behavior normal.     Procedures  Procedures  ED Course / MDM     Medical Decision Making Amount and/or Complexity of Data Reviewed Labs: ordered.  Risk OTC drugs. Prescription drug management.   48 year old presenting to the ED out of concern of alcohol  withdrawal.  Please see previous provider note for further details.  In short, 48 year old presenting for alcohol  withdrawal.  On exam, the patient is not tachycardic, he has a slightly elevated blood pressure.  He is signed out to me pending a TTS consult as the patient apparently began screaming about having demons inside of him.  The patient had a recent admission and discharged from behavioral health urgent care secondary to psychosis.  He has no admissions in his chart I am able to see related to alcohol  withdrawal.  Patient was observed.  His blood pressure remained stable as well as his heart rate.  No tachycardia noted.  Patient only received 2 mg Ativan .  Patient was assessed by TTS.  Determined to be inpatient psychiatric admission.  Stable on admission.       Ruthell Lonni FALCON, PA-C 08/04/24 0558    Theadore Ozell HERO, MD 08/04/24 [phone]
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
## 2024-08-04 NOTE — Plan of Care (Signed)
   Problem: Education: Goal: Emotional status will improve Outcome: Progressing Goal: Mental status will improve Outcome: Progressing Goal: Verbalization of understanding the information provided will improve Outcome: Progressing

## 2024-08-04 NOTE — Progress Notes (Addendum)
 Pt has been accepted to [facility] on 08/04/2024 Bed assignment: 403-01  Pt meets inpatient criteria per: Cathaleen Jacobson NP  Attending Physician will be: Dr. Prentis    Report can be called to:Adult unit: [phone]  Pt can arrive after UDS and an EKG. VOL CONSENT   Care Team Notified: [facility] Cherylynn Ernst RN, Cathaleen Jacobson RN, April Wilson RN  [place] Shanteria Laye LCSW-A   08/04/2024 11:13 AM

## 2024-08-04 NOTE — Progress Notes (Signed)
 ICM consulted for substance abuse resources. Resources provided and attached to AVS. No further ICM needs.

## 2024-08-04 NOTE — Progress Notes (Signed)
   08/04/24 1814  Psych Admission Type (Psych Patients Only)  Admission Status Voluntary  Psychosocial Assessment  Patient Complaints Anxiety  Eye Contact Fair  Facial Expression Flat  Affect Preoccupied  Speech Slow  Interaction Guarded  Motor Activity Fidgety;Restless  Appearance/Hygiene Disheveled;Body odor  Behavior Characteristics Anxious  Mood Preoccupied  Thought Process  Coherency Blocking  Content WDL  Delusions None reported or observed  Perception WDL  Hallucination Auditory  Judgment Limited  Confusion Mild  Danger to Self  Current suicidal ideation? Passive  Description of Suicide Plan No Plan  Agreement Not to Harm Self Yes  Description of Agreement Verbal  Danger to Others  Danger to Others None reported or observed

## 2024-08-04 NOTE — Plan of Care (Signed)
  Problem: Safety: Goal: Ability to disclose and discuss suicidal ideas will improve Outcome: Not Progressing   Problem: Self-Concept: Goal: Will verbalize positive feelings about self Outcome: Not Progressing Goal: Level of anxiety will decrease Outcome: Not Progressing

## 2024-08-04 NOTE — Progress Notes (Signed)
   08/04/24 2023  Psych Admission Type (Psych Patients Only)  Admission Status Voluntary  Psychosocial Assessment  Patient Complaints Anxiety  Eye Contact Fair  Facial Expression Anxious  Affect Preoccupied  Speech Logical/coherent  Interaction Guarded;Isolative  Motor Activity Fidgety  Appearance/Hygiene Disheveled  Behavior Characteristics Cooperative  Mood Preoccupied  Thought Process  Coherency Blocking  Content WDL  Delusions None reported or observed  Perception Hallucinations  Hallucination Auditory;Visual (Pt reports hearing voices and seeing rats across the floor. Pt is observed by staff talking/yelling to himself in room.)  Judgment Limited  Confusion Mild  Danger to Self  Current suicidal ideation? Denies  Agreement Not to Harm Self Yes  Description of Agreement Verbal  Danger to Others  Danger to Others None reported or observed

## 2024-08-04 NOTE — Tx Team (Signed)
 Initial Treatment Plan 08/04/2024 6:33 PM ELIAZAR OLIVAR FMW:[id number]    PATIENT STRESSORS: Financial difficulties   Medication change or noncompliance   Substance abuse     PATIENT STRENGTHS: Motivation for treatment/growth    PATIENT IDENTIFIED PROBLEMS: Alcohol  Abuse  Substance  Depression  SI  Homeless             DISCHARGE CRITERIA:  Adequate post-discharge living arrangements Verbal commitment to aftercare and medication compliance Withdrawal symptoms are absent or subacute and managed without 24-hour nursing intervention  PRELIMINARY DISCHARGE PLAN: Outpatient therapy  PATIENT/FAMILY INVOLVEMENT: This treatment plan has been presented to and reviewed with the patient, [name] [name].  The patient and family have been given the opportunity to ask questions and make suggestions.  Inocente JINNY Cassette, RN 08/04/2024, 6:33 PM

## 2024-08-04 NOTE — BH Assessment (Signed)
@   2215 TTS Consult will be completed by [facility]. [facility] provider will notify team of assessment time and provider name. Thanks

## 2024-08-04 NOTE — Progress Notes (Signed)
 Admission Note:   [name] [name] is a 48 year old male admitted to [facility] Voluntarily. Patient reports that he has been drinking a large amount of alcohol . Patient reports he has been drinking a half a gallon a day. Patient states that he has been drinking today but not as much as he has been over the past week. Patient is worried that he is having withdrawal. Patient states he has had some hallucinations. Patient states he has had thoughts of harming himself. Patient is requesting help to stop drinking and to stop the hallucinations. Patient states that he has had thoughts of killing himself if he cannot stop drinking.  Pt currently endorses passive SI and can contract for safety. Pt denies HI. Pt is currently responding as observed by this nurse. Admission is complete and all consents are signed. Pt,s contents are secured in locker. Skin assessment complete. Skin is unremarkable and no contraband found. Pt was oriented to the unit. Pt is encouraged to come to staff with any needs or concerns. Nutrition is offered. Q 15 min checks for safety have begun. Pt remains safe on the unit. Will continue to monitor.

## 2024-08-04 NOTE — ED Notes (Addendum)
 Patient transferred back with RN and security. He has changed into burgundy scrubs. Patient belongings placed in locker 31.

## 2024-08-05 ENCOUNTER — Encounter (HOSPITAL_COMMUNITY): Payer: Self-pay

## 2024-08-05 MED ORDER — LORAZEPAM 2 MG/ML IJ SOLN
2.0000 mg | Freq: Once | INTRAMUSCULAR | Status: DC | PRN
Start: 2024-08-05 — End: 2024-08-05

## 2024-08-05 MED ORDER — QUETIAPINE FUMARATE 100 MG PO TABS
100.0000 mg | ORAL_TABLET | Freq: Every day | ORAL | Status: DC
  Administered 2024-08-06 – 2024-08-10 (×6): 100 mg via ORAL
  Filled 2024-08-05 (×5): qty 1

## 2024-08-05 MED ORDER — QUETIAPINE FUMARATE 200 MG PO TABS
200.0000 mg | ORAL_TABLET | Freq: Every day | ORAL | Status: DC
  Administered 2024-08-05 – 2024-08-10 (×6): 200 mg via ORAL
  Filled 2024-08-05 (×6): qty 1

## 2024-08-05 MED ORDER — QUETIAPINE FUMARATE 100 MG PO TABS
100.0000 mg | ORAL_TABLET | Freq: Every day | ORAL | Status: DC
  Administered 2024-08-05 – 2024-08-11 (×7): 100 mg via ORAL
  Filled 2024-08-05: qty 28
  Filled 2024-08-05 (×4): qty 1
  Filled 2024-08-05: qty 28
  Filled 2024-08-05 (×3): qty 1

## 2024-08-05 MED ORDER — QUETIAPINE FUMARATE 100 MG PO TABS: 100.0000 mg | ORAL_TABLET | Freq: Every day | ORAL | Status: DC

## 2024-08-05 MED ORDER — LORAZEPAM 2 MG/ML IJ SOLN
2.0000 mg | Freq: Once | INTRAMUSCULAR | Status: AC | PRN
  Administered 2024-08-06: 2 mg via INTRAMUSCULAR

## 2024-08-05 NOTE — BH IP Treatment Plan (Signed)
 Interdisciplinary Treatment and Diagnostic Plan Update  08/05/2024 Time of Session: 1010AM [name] [name] MRN: [id number]  Principal Diagnosis: MDD (major depressive disorder)  Secondary Diagnoses: Principal Problem:   MDD (major depressive disorder)   Current Medications:  Current Facility-Administered Medications  Medication Dose Route Frequency Provider Last Rate Last Admin   acetaminophen  (TYLENOL ) tablet 650 mg  650 mg Oral Q6H PRN Motley-Mangrum, Jadeka A, PMHNP   650 mg at 08/05/24 0819   alum & mag hydroxide-simeth (MAALOX/MYLANTA) 200-200-20 MG/5ML suspension 30 mL  30 mL Oral Q4H PRN Motley-Mangrum, Jadeka A, PMHNP       haloperidol  (HALDOL ) tablet 5 mg  5 mg Oral TID PRN Motley-Mangrum, Jadeka A, PMHNP   5 mg at 08/04/24 2023   And   diphenhydrAMINE  (BENADRYL ) capsule 50 mg  50 mg Oral TID PRN Motley-Mangrum, Jadeka A, PMHNP   50 mg at 08/04/24 2024   haloperidol  lactate (HALDOL ) injection 5 mg  5 mg Intramuscular TID PRN Motley-Mangrum, Jadeka A, PMHNP       And   diphenhydrAMINE  (BENADRYL ) injection 50 mg  50 mg Intramuscular TID PRN Motley-Mangrum, Jadeka A, PMHNP       And   LORazepam  (ATIVAN ) injection 2 mg  2 mg Intramuscular TID PRN Motley-Mangrum, Jadeka A, PMHNP       haloperidol  lactate (HALDOL ) injection 10 mg  10 mg Intramuscular TID PRN Motley-Mangrum, Jadeka A, PMHNP       And   diphenhydrAMINE  (BENADRYL ) injection 50 mg  50 mg Intramuscular TID PRN Motley-Mangrum, Jadeka A, PMHNP       And   LORazepam  (ATIVAN ) injection 2 mg  2 mg Intramuscular TID PRN Motley-Mangrum, Jadeka A, PMHNP       hydrOXYzine  (ATARAX ) tablet 25 mg  25 mg Oral Q6H PRN Motley-Mangrum, Jadeka A, PMHNP   25 mg at 08/05/24 0818   loperamide  (IMODIUM ) capsule 2-4 mg  2-4 mg Oral PRN Motley-Mangrum, Jadeka A, PMHNP       LORazepam  (ATIVAN ) tablet 1 mg  1 mg Oral Q6H PRN Motley-Mangrum, Jadeka A, PMHNP   1 mg at 08/05/24 1308   magnesium  hydroxide (MILK OF MAGNESIA) suspension 30 mL  30 mL  Oral Daily PRN Motley-Mangrum, Jadeka A, PMHNP       multivitamin with minerals tablet 1 tablet  1 tablet Oral Daily Motley-Mangrum, Jadeka A, PMHNP   1 tablet at 08/05/24 0819   nicotine  (NICODERM CQ  - dosed in mg/24 hours) patch 14 mg  14 mg Transdermal Daily Prentis Kitchens A, DO   14 mg at 08/05/24 9183   OLANZapine  zydis (ZYPREXA ) disintegrating tablet 5 mg  5 mg Oral BID PRN Motley-Mangrum, Jadeka A, PMHNP       ondansetron  (ZOFRAN -ODT) disintegrating tablet 4 mg  4 mg Oral Q6H PRN Motley-Mangrum, Jadeka A, PMHNP   4 mg at 08/05/24 0818   thiamine  (Vitamin B-1) tablet 100 mg  100 mg Oral Daily Motley-Mangrum, Jadeka A, PMHNP   100 mg at 08/05/24 0818   PTA Medications: Medications Prior to Admission  Medication Sig Dispense Refill Last Dose/Taking   QUEtiapine  (SEROQUEL ) 100 MG tablet Take 1 tablet (100 mg total) by mouth daily. 30 tablet 0 08/03/2024   QUEtiapine  (SEROQUEL ) 200 MG tablet Take 1 tablet (200 mg total) by mouth at bedtime. 30 tablet 0 08/03/2024    Patient Stressors: Financial difficulties   Medication change or noncompliance   Substance abuse    Patient Strengths: Motivation for treatment/growth   Treatment Modalities: Medication Management, Group  therapy, Case management,  1 to 1 session with clinician, Psychoeducation, Recreational therapy.   Physician Treatment Plan for Primary Diagnosis: MDD (major depressive disorder) Long Term Goal(s):     Short Term Goals:    Medication Management: Evaluate patient's response, side effects, and tolerance of medication regimen.  Therapeutic Interventions: 1 to 1 sessions, Unit Group sessions and Medication administration.  Evaluation of Outcomes: Not Progressing  Physician Treatment Plan for Secondary Diagnosis: Principal Problem:   MDD (major depressive disorder)  Long Term Goal(s):     Short Term Goals:       Medication Management: Evaluate patient's response, side effects, and tolerance of medication  regimen.  Therapeutic Interventions: 1 to 1 sessions, Unit Group sessions and Medication administration.  Evaluation of Outcomes: Not Progressing   RN Treatment Plan for Primary Diagnosis: MDD (major depressive disorder) Long Term Goal(s): Knowledge of disease and therapeutic regimen to maintain health will improve  Short Term Goals: Ability to remain free from injury will improve, Ability to verbalize frustration and anger appropriately will improve, Ability to demonstrate self-control, Ability to participate in decision making will improve, Ability to verbalize feelings will improve, Ability to disclose and discuss suicidal ideas, Ability to identify and develop effective coping behaviors will improve, and Compliance with prescribed medications will improve  Medication Management: RN will administer medications as ordered by provider, will assess and evaluate patient's response and provide education to patient for prescribed medication. RN will report any adverse and/or side effects to prescribing provider.  Therapeutic Interventions: 1 on 1 counseling sessions, Psychoeducation, Medication administration, Evaluate responses to treatment, Monitor vital signs and CBGs as ordered, Perform/monitor CIWA, COWS, AIMS and Fall Risk screenings as ordered, Perform wound care treatments as ordered.  Evaluation of Outcomes: Not Progressing   LCSW Treatment Plan for Primary Diagnosis: MDD (major depressive disorder) Long Term Goal(s): Safe transition to appropriate next level of care at discharge, Engage patient in therapeutic group addressing interpersonal concerns.  Short Term Goals: Engage patient in aftercare planning with referrals and resources, Increase social support, Increase ability to appropriately verbalize feelings, Increase emotional regulation, Facilitate acceptance of mental health diagnosis and concerns, Facilitate patient progression through stages of change regarding substance use  diagnoses and concerns, Identify triggers associated with mental health/substance abuse issues, and Increase skills for wellness and recovery  Therapeutic Interventions: Assess for all discharge needs, 1 to 1 time with Social worker, Explore available resources and support systems, Assess for adequacy in community support network, Educate family and significant other(s) on suicide prevention, Complete Psychosocial Assessment, Interpersonal group therapy.  Evaluation of Outcomes: Not Progressing   Progress in Treatment: Attending groups: No. Participating in groups: No. Taking medication as prescribed: Yes. Toleration medication: Yes. Family/Significant other contact made: No, will contact:   Darin Rosaline Stain [phone] Patient understands diagnosis: Yes. Discussing patient identified problems/goals with staff: Yes. Medical problems stabilized or resolved: Yes. Denies suicidal/homicidal ideation: Yes. Issues/concerns per patient self-inventory: No.   New problem(s) identified: No, Describe:  none  New Short Term/Long Term Goal(s): detox, medication management for mood stabilization; elimination of SI thoughts; development of comprehensive mental wellness/sobriety plan   Patient Goals:  Get into substance use treatment and find meds for the voices  Discharge Plan or Barriers: Patient recently admitted. CSW will continue to follow and assess for appropriate referrals and possible discharge planning.    Reason for Continuation of Hospitalization: Depression Medication stabilization Suicidal ideation Withdrawal symptoms  Estimated Length of Stay: 5-7 days  Last 3 [place] Suicide  Severity Risk Score: Flowsheet Row [facility] (Current) [facility]03/2024 [facility] Admission (Discharged) [facility]  C-SSRS  RISK CATEGORY High Risk High Risk Moderate Risk    Last PHQ 2/9 Scores:    10/05/2021    2:29 AM 09/21/2021   12:04 PM 07/01/2017    8:05 AM  Depression screen PHQ 2/9  Decreased Interest 2 2 3   Down, Depressed, Hopeless 2 3 3   PHQ - 2 Score 4 5 6   Altered sleeping 0 3 2  Tired, decreased energy 0 2 2  Change in appetite 0 3 3  Feeling bad or failure about yourself  2 2 2   Trouble concentrating 0 2 2  Moving slowly or fidgety/restless 1 2 2   Suicidal thoughts 2 3   PHQ-9 Score 9 22 19   Difficult doing work/chores Extremely dIfficult  Not difficult at all    Scribe for Treatment Team: Jenkins LULLA Primer, LCSWA 08/05/2024 1:32 PM

## 2024-08-05 NOTE — Progress Notes (Signed)

## 2024-08-05 NOTE — Plan of Care (Signed)
 Nurse discussed anxiety, depression and coping skills with patient.

## 2024-08-05 NOTE — H&P (Signed)
 Psychiatric Admission Assessment Adult  Patient Identification: [name] [name] MRN:  [id number] Date of Evaluation:  08/05/2024 Chief Complaint:  MDD (major depressive disorder) [F32.9] Principal Diagnosis: Alcohol  withdrawal syndrome with perceptual disturbance (HCC) Diagnosis:  Principal Problem:   Alcohol  withdrawal syndrome with perceptual disturbance (HCC) Active Problems:   Schizoaffective disorder, depressive type (HCC)   Alcohol  use disorder, severe, dependence (HCC)    CC: Alcohol  withdrawal, hallucinations, passive SI  [name] [name] is a 48 y.o. 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 Patient initially arrived to [facility] on 08/03/2024 for alcohol  withdrawal and hallucinations and passive SI, and admitted to [facility] on 08/04/2024 for acute safety concerns, crisis stabalization, acute suicidal or self-harming behaviors, impaired functioning, substance related issues, severe substance-induced psychosis or mood disturbances, intensive therapeutic interventions, and stabilization of acute on chronic psychiatric conditions.   HPI:  Patient reports when he left the hospital about a week ago that he stopped taking Seroquel  and started drinking.  Reports that he has been drinking 1/5 a day for the last week.  On day 2 or 3 he reported he started having the auditory hallucinations again.  Reports that he initially dealt with hallucinations and kept on drinking but by the 7-day he was having issues with eating due to the severity of his alcohol  use and decided it was time for him to come the hospital.  He does report currently he is still having issues with eating but reports that he is able to take in fluids okay and is not having severe pain.  He reported that prior to coming he was starting to  have passive suicidal thoughts.  He reports that he was not able to make it to his appointment.  He does not endorse any other substance use aside from alcohol  and denies using any meth or other stimulants.  Currently for alcohol  drawl symptoms endorses having perceptual disturbances including auditory and visual and tactile hallucinations.  He also reports having tremors.  On review of systems he also endorses having nausea and diaphoresis.  He denies having diarrhea or vomiting.  He reports that he is open to getting treatment and says he was last in rehab 3 years ago.  Says that he would be interested in a 1 month program.  Says that he would be interested in doing a transitional living facility after residential treatment.  Patient was agreeable to restarting his Seroquel .  He reports that he has been staying with a friend.  Discussed with patient doing alcohol  craving medications but he declined at this time.  On other psychiatric view systems, patient endorses having depressed mood, anhedonia, passive suicidal thoughts.  Reports that his sleep has been impaired with the hallucinations.  Reports that his appetite has been poor in last day or 2 since he has been having poor p.o. intake from the alcohol  use.  He endorsed anxiety but appropriate to stressors.  He endorses psychosis including auditory and visual hallucinations as well as paranoia.  Patient does not describing delusions during encounter.  Patient endorses using alcohol  1/5 a day over the last 7 days.  Patient is not endorse any other substance use.  Patient denies any traumatic experiences.   Collateral Information: Patient declines except for safety planning  Past Psychiatric Hx: Current Psychiatrist: none currently, was suppose to follow up with [facility] in  [place] after hosp in July. Previously has been to [facility] in [place].  Current Therapist: none Previous Psychiatric Diagnoses: extensive diagnoses in the chart including numerous  mood, psychotic, substance. Per pt schizoaffective disorder (type unknown).   Psychiatric Medications: Current Seroquel  per patient. Doesn't know dose Per pharm records, zyprexa  10 mg at bedtime Past Zyprexa  helps sleep but not AH Seroquel  helps sleep and AH  Psychiatric Hospitalization hx: numerous including several this year. Last hospitalization was in July at [facility].  ACT team hx: no history but interested Neuromodulation history: n/a  History of suicide: no attempts but extensive SI per chart History of homicide or aggression: denies   Substance Use Hx: Alcohol : 1/5/day over the last 7 days.  Reports history of alcohol  withdrawal seizures but none per chart review. Tobacco: vaping and cigarettes daily Cannabis: few hits a week Other Substances: substantial hx of meth, cocaine, opoid use. No use in the last several years.  Non-prescribed medications: n/a Rehab History: yes but not in the last several years   Past Medical History: PCP: none currently Medical Dx: none per patient Meds: none per patient Allergies: n/a Hospitalizations: none, numerous ED visit though Surgeries: n/a TBI: denies Seizures: denies   Family History: family history includes Alcohol  abuse in his father; Cancer in his maternal aunt. No primary psychotic disorder in family. Mother has anxiety.    Social History: Developmental: did not discuss Current Living Situation: Currently is in [place].  Was staying with a friend prior to admission.  Experiencing homelessness. Education: did not discuss Occupation: been on disability since early 20s, also on medicare Hobbies: exercise particularly walking Spirituality/religiosity: did not discuss Marital Status / Relationships: not in relationship currently Children: did not discuss Military: denies   Legal: denies any upcoming court dates.  Access to firearms: denies   Total Time spent with patient: 1 hour   [place] Scale:  Flowsheet Row  [facility] 08/03/2024 in [facility] Admission (Discharged) [facility]  C-SSRS RISK CATEGORY High Risk High Risk Moderate Risk     Lab Results:  Results for orders placed or performed during the hospital encounter of 08/03/24 (from the past 48 hours)  Comprehensive metabolic panel     Status: Abnormal   Collection Time: 08/03/24  6:46 PM  Result Value Ref Range   Sodium 140 135 - 145 mmol/L   Potassium 3.9 3.5 - 5.1 mmol/L   Chloride 101 98 - 111 mmol/L   CO2 27 22 - 32 mmol/L   Glucose, Bld 103 (H) 70 - 99 mg/dL    Comment: Glucose reference range applies only to samples taken after fasting for at least 8 hours.   BUN 9 6 - 20 mg/dL   Creatinine, Ser 9.35 0.61 - 1.24 mg/dL   Calcium 9.3 8.9 - 89.6 mg/dL   Total Protein 6.9 6.5 - 8.1 g/dL   Albumin 4.2 3.5 - 5.0 g/dL   AST 66 (H) 15 - 41 U/L    Comment: HEMOLYSIS AT THIS LEVEL MAY AFFECT RESULT   ALT 58 (H) 0 - 44 U/L   Alkaline Phosphatase 78 38 - 126 U/L   Total Bilirubin 0.4 0.0 - 1.2 mg/dL   GFR, Estimated >39 >39 mL/min    Comment: (NOTE) Calculated using the CKD-EPI Creatinine Equation (2021)    Anion gap 12 5 - 15    Comment:  Performed at [facility], [address] [place], [place] [address]  Ethanol     Status: Abnormal   Collection Time: 08/03/24  6:46 PM  Result Value Ref Range   Alcohol , Ethyl (B) 87 (H) <15 mg/dL    Comment: (NOTE) For medical purposes only. Performed at [facility], [address], [place], [place] [address]   cbc     Status: Abnormal   Collection Time: 08/03/24  6:46 PM  Result Value Ref Range   WBC 11.1 (H) 4.0 - 10.5 K/uL   RBC 4.65 4.22 - 5.81 MIL/uL   Hemoglobin 14.3 13.0 - 17.0 g/dL   HCT 56.4 60.9 - 47.9 %   MCV 93.5 80.0 - 100.0 fL   MCH 30.8 26.0 - 34.0 pg    MCHC 32.9 30.0 - 36.0 g/dL   RDW 87.0 88.4 - 84.4 %   Platelets 390 150 - 400 K/uL   nRBC 0.0 0.0 - 0.2 %    Comment: Performed at [facility], [address] [place], [place] [address]  Rapid urine drug screen (hospital performed)     Status: Abnormal   Collection Time: 08/03/24  6:46 PM  Result Value Ref Range   Opiates NEGATIVE NEGATIVE   Cocaine NEGATIVE NEGATIVE   Benzodiazepines POSITIVE (A) NEGATIVE   Amphetamines NEGATIVE NEGATIVE   Tetrahydrocannabinol POSITIVE (A) NEGATIVE   Barbiturates NEGATIVE NEGATIVE   Methadone Scn, Ur NEGATIVE NEGATIVE   Fentanyl NEGATIVE NEGATIVE    Comment: (NOTE) Drug screen is for Medical Purposes only. Positive results are preliminary only. If confirmation is needed, notify lab within 5 days.  Drug Class                 Cutoff (ng/mL) Amphetamine and metabolites 1000 Barbiturate and metabolites 200 Benzodiazepine              200 Opiates and metabolites     300 Cocaine and metabolites     300 THC                         50 Fentanyl                    5 Methadone                   300  Trazodone  is metabolized in vivo to several metabolites,  including pharmacologically active m-CPP, which is excreted in the  urine.  Immunoassay screens for amphetamines and MDMA have potential  cross-reactivity with these compounds and may provide false positive  result.  Performed at [facility], [address] [place], [place] [address]     Blood Alcohol  level:  Lab Results  Component Value Date   ETH 87 (H) 08/03/2024   ETH <15 07/22/2024    Metabolic Disorder Labs:  See assessment and plan.      Psychiatric Specialty Exam:  Presentation  General Appearance:  Bizarre; Disheveled  Eye Contact: Fair  Speech: Normal Rate  Speech Volume: Normal   Mood and Affect  Mood: Dysphoric  Affect: Congruent   Thought Process  Thought Processes: Coherent; Linear  Descriptions of  Associations: Intact  Orientation: Full (Time, Place and Person)  Thought Content: Logical; Paranoid Ideation  Hallucinations:Hallucinations: Auditory; Visual; Tactile  Ideas of Reference: None  Suicidal Thoughts:Suicidal Thoughts: Yes, Passive  Homicidal Thoughts:Homicidal Thoughts: No   Sensorium  Memory: Immediate Good; Recent Good; Remote Good  Judgment: Poor  Insight: Fair   Chartered certified accountant: Good  Attention Span: Good  Recall: Good  Fund of Knowledge: Good  Language: Good   Psychomotor Activity  Psychomotor Activity:Psychomotor Activity: Decreased (No EPS)   Assets  Assets: Desire for Improvement   Sleep  Sleep:Sleep: Poor (Due to hallucinations)  Estimated Sleeping Duration (Last 24 Hours): 8.75 hours  Physical Exam: Physical Exam Vitals and nursing note reviewed.  HENT:     Head: Normocephalic and atraumatic.  Pulmonary:     Effort: Pulmonary effort is normal.  Neurological:     General: No focal deficit present.     Mental Status: He is alert.  Psychiatric:     Comments: No obvious EPS.    Review of Systems  Constitutional:  Negative for diaphoresis and fever.  Cardiovascular:  Negative for chest pain and palpitations.  Gastrointestinal:  Positive for nausea. Negative for constipation, diarrhea and vomiting.  Neurological:  Positive for tremors. Negative for dizziness, weakness and headaches.  Psychiatric/Behavioral:  Positive for hallucinations.        Pt denies extrapyramidal symptoms including dystonia (sudden spastic contractions of muscle groups), parkinsonism (bradykinesia, tremors, rigidity), and akathisia (severe restlessness).    Blood pressure (!) 145/100, pulse 92, temperature 98.6 F (37 C), temperature source Oral, resp. rate 18, height 5' 9 (1.753 m), weight 72.6 kg, SpO2 98%. Body mass index is 23.63 kg/m.   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  Patient endorses having history of alcohol  withdrawal seizures but no encounter in the EMR has objective evidence for this so we will proceed with close monitoring but will not start fixed benzodiazepines at this time given he only drinks over the last 7 days and given his alcohol  drawl symptoms are manageable including objective signs such as his vitals.  CIWA's have been very elevated due to his hallucinations which are a part of his schizoaffective disorder which are causing them to be higher than usual.  Objectively on physical exam he is not having ill-appearing look, severe diaphoresis, any vomiting, is tolerating p.o. okay, reassuring's for a uncomplicated course so far.  However will escalate to fixed dose benzodiazepine if he has persistently elevated CIWA's.  On discussion of patient's alcohol  use disorder talked with him about doing rehab.  Given patient is experiencing homelessness, discussed with him doing rehab and then transitioning facility to allow him to have housing.  Patient was agreeable to this.  Social worker will fax out to residential rehabs in the area and patient can transfer there once he is psychiatrically stable which we suspect will only take a few days as he just needs to restart his  medication and have an uncomplicated alcohol  drawl syndrome.  Discussed with patient doing alcohol  cream medication such as naltrexone and he was not agreeable at this time but we will continue discussed.  Patient's  hallucinations appear more in the context of his primary psychotic sorter than from alcohol  drawl syndrome severity including the entrapments.  Plan to restart his Seroquel  that he was previously on as he tolerated well without side effects.  Plan to continue doing 3 times daily dosing given the severity of his symptoms.  Plan to monitor and adjust medication as indicated.   PLAN: Safety and Monitoring:             -- Voluntary admission to inpatient psychiatric unit for safety, stabilization and treatment             -- Daily contact with patient to assess and evaluate symptoms and progress in treatment             -- Patient's case to be discussed in multi-disciplinary team meeting             -- Observation Level : q15 minute checks             -- Vital signs:  q12 hours             -- Precautions: suicide, elopement, and assault   2. Psychiatric Treatment:  -- Start Seroquel  100 mg once in the morning, 100 mg once in the afternoon, 200 mg once at bedtime for schizoaffective disorder   -- Continue trazodone  50 mg once nightly as needed for insomnia  -- Continue hydroxyzine  25 mg 3 times daily as needed for anxiety  --  The risks/benefits/side-effects/alternatives to this medication were discussed in detail with the patient and time was given for questions. The patient consents to medication trial.              -- Metabolic profile and EKG monitoring obtained while on an atypical antipsychotic. See #4 below for values.              -- Encouraged patient to participate in unit milieu and in scheduled group therapies              -- Short Term Goals: Ability to identify changes in lifestyle to reduce recurrence of condition will improve, Ability to verbalize feelings will improve,  Ability to disclose and discuss suicidal ideas, Ability to demonstrate self-control will improve, Ability to identify and develop effective coping behaviors will improve, Ability to maintain clinical measurements within normal limits will improve, Compliance with prescribed medications will improve, and Ability to identify triggers associated with substance abuse/mental health issues will improve             -- Long Term Goals: Improvement in symptoms so as ready for discharge                3. Medical Issues Being Addressed:              -- Start CIWA with Ativan  as needed for alcohol  drawl syndrome; will escalate this dose Ativan  if indicated    -- Continue PRN's: Tylenol , Maalox, Milk of Magnesia   4. Routine and other pertinent labs reviewed: EKG monitoring: QTc: 420  BMI: Body mass index is 23.63 kg/m.   Lipid Panel: Lab Results  Component Value Date   CHOL 170 10/10/2021   TRIG 86 10/10/2021   HDL 64 10/10/2021   CHOLHDL 2.7 10/10/2021   VLDL 17 10/10/2021   LDLCALC 89 10/10/2021   LDLCALC 66 09/22/2021    HbgA1c: Hgb A1c MFr Bld (%)  Date Value  09/22/2021 5.4    TSH: TSH (uIU/mL)  Date Value  09/22/2021  2.873  01/27/2013 0.344 (L)    Labs to order: None  5. Discharge Planning:              -- Social work and case management to assist with discharge planning and identification of hospital follow-up needs prior to discharge             -- Estimated LOS: 5-7 days              -- Discharge Concerns: Need to establish a safety plan; Medication compliance and effectiveness             -- Discharge Goals: Return home with outpatient referrals for mental health follow-up including medication management/psychotherapy    I certify that inpatient services furnished can reasonably be expected to improve the patient's condition.     Justino Cornish, MD PGY-2 Psychiatry Resident 08/05/2024, 5:12 PM
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
## 2024-08-05 NOTE — [facility] Suicide Risk Assessment (Signed)
 [facility] INPATIENT:  Family/Significant Other Suicide Prevention Education  Suicide Prevention Education:  Education Completed;  Sister, [name] [name] [phone],  (name of family member/significant other) has been identified by the patient as the family member/significant other with whom the patient will be residing, and identified as the person(s) who will aid the patient in the event of a mental health crisis (suicidal ideations/suicide attempt).  With written consent from the patient, the family member/significant other has been provided the following suicide prevention education, prior to the and/or following the discharge of the patient.  Does not have access to weapons or firearms to sister's knowledge.  The suicide prevention education provided includes the following: Suicide risk factors Suicide prevention and interventions National Suicide Hotline telephone number [facility] assessment telephone number [facility] Emergency Assistance 911 [facility] and/or Residential Mobile Crisis Unit telephone number  Request made of family/significant other to: Remove weapons (e.g., guns, rifles, knives), all items previously/currently identified as safety concern.   Remove drugs/medications (over-the-counter, prescriptions, illicit drugs), all items previously/currently identified as a safety concern.  The family member/significant other verbalizes understanding of the suicide prevention education information provided.  The family member/significant other agrees to remove the items of safety concern listed above.  Jenkins LULLA Primer 08/05/2024, 3:22 PM

## 2024-08-05 NOTE — Group Note (Signed)
 Date:  08/05/2024 Time:  4:16 PM  Group Topic/Focus:  Personal Choices and Values:   The focus of this group is to help patients assess and explore the importance of values in their lives, how their values affect their decisions, how they express their values and what opposes their expression.    Participation Level:  Did Not Attend    Huel Mall 08/05/2024, 4:16 PM

## 2024-08-05 NOTE — Progress Notes (Signed)
 [facility]   Type of Note: Substance Use Treatment  Patient was given list of substance use treatment facilities. Pt interested in either [name] or [facility]. Referrals to be faxed once H&P is complete. Pt aware to call [facility] to complete phone screen tomorrow afternoon. Pt has phone number.  Will continue to assist.  Signed:  Areon Cocuzza, LCSW-A 08/05/2024  11:39 AM

## 2024-08-05 NOTE — [facility] Suicide Risk Assessment (Signed)
 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  Reports that he has been drinking 1/5 a day for the last week.  On day 2 or 3 he reported he started having the auditory hallucinations again.  Reports that he initially dealt with hallucinations and kept on drinking but by the 7-day he was having issues with eating due to the severity of his alcohol  use and decided it was time for him to come the hospital.  He does report currently he is still having issues with eating but reports that he is able to take in fluids okay and is not having severe pain.  He reported that prior to coming he was starting to have passive suicidal thoughts.  He reports that he was not able to make it to his appointment.  He does not endorse any other substance use aside from alcohol  and denies using any meth or other stimulants.  Currently for alcohol  drawl symptoms endorses having perceptual disturbances including auditory and visual and tactile hallucinations.  He also reports having tremors.  On review of systems he also endorses having nausea and diaphoresis.  He denies having diarrhea or vomiting.  He reports that he is open to getting treatment and says he was  last in rehab 3 years ago.  Says that he would be interested in a 1 month program.  Says that he would be interested in doing a transitional living facility after residential treatment.  Patient was agreeable to restarting his Seroquel .  He reports that he has been staying with a friend.  Discussed with patient doing alcohol  craving medications but he declined at this time.   On other psychiatric view systems, patient endorses having depressed mood, anhedonia, passive suicidal thoughts.  Reports that his sleep has been impaired with the hallucinations.  Reports that his appetite has been poor in last day or 2 since he has been having poor p.o. intake from the alcohol  use.  He endorsed anxiety but appropriate to stressors.  He endorses psychosis including auditory and visual hallucinations as well as paranoia.  Patient does not describing delusions during encounter.  Patient endorses using alcohol  1/5 a day over the last 7 days.  Patient is not endorse any other substance use.  Patient denies any traumatic experiences.  Continued Clinical Symptoms:  Alcohol  Use Disorder Identification Test Final Score (AUDIT): 0 The Alcohol  Use Disorders Identification Test, Guidelines for Use in Primary Care, Second Edition.  World Science writer [facility]). Score between 0-7:  no or low risk or alcohol  related problems. Score between 8-15:  moderate risk of alcohol  related problems. Score between 16-19:  high risk of alcohol  related problems. Score 20 or above:  warrants further diagnostic evaluation for alcohol  dependence and treatment.   CLINICAL FACTORS:   Depression:   Anhedonia Insomnia Severe Schizophrenia:   Depressive  state   Musculoskeletal: Strength & Muscle Tone: within normal limits Gait & Station: normal Patient leans: N/A  Psychiatric Specialty Exam:  Presentation  General Appearance:  Bizarre; Disheveled  Eye Contact: Fair  Speech: Normal Rate  Speech  Volume: Normal  Handedness:No data recorded  Mood and Affect  Mood: Dysphoric  Affect: Congruent   Thought Process  Thought Processes: Coherent; Linear  Descriptions of Associations:Intact  Orientation:Full (Time, Place and Person)  Thought Content:Logical; Paranoid Ideation  History of Schizophrenia/Schizoaffective disorder:Yes  Duration of Psychotic Symptoms:Greater than six months  Hallucinations:Hallucinations: Auditory; Visual; Tactile  Ideas of Reference:None  Suicidal Thoughts:Suicidal Thoughts: Yes, Passive  Homicidal Thoughts:Homicidal Thoughts: No   Sensorium  Memory: Immediate Good; Recent Good; Remote Good  Judgment: Poor  Insight: Fair   Art therapist  Concentration: Good  Attention Span: Good  Recall: Good  Fund of Knowledge: Good  Language: Good   Psychomotor Activity  Psychomotor Activity: Psychomotor Activity: Decreased (No EPS)   Assets  Assets: Desire for Improvement   Sleep  Sleep: Sleep: Poor (Due to hallucinations)    Physical Exam: Physical Exam Vitals and nursing note reviewed.  HENT:     Head: Normocephalic and atraumatic.  Pulmonary:     Effort: Pulmonary effort is normal.  Neurological:     General: No focal deficit present.     Mental Status: He is alert.  Psychiatric:     Comments: No obvious EPS.    Review of Systems  Constitutional:  Negative for diaphoresis and fever.  Cardiovascular:  Negative for chest pain and palpitations.  Gastrointestinal:  Positive for nausea. Negative for constipation, diarrhea and vomiting.  Neurological:  Positive for tremors. Negative for dizziness, weakness and headaches.  Psychiatric/Behavioral:  Positive for hallucinations.        Pt denies extrapyramidal symptoms including dystonia (sudden spastic contractions of muscle groups), parkinsonism (bradykinesia, tremors, rigidity), and akathisia (severe restlessness).    Blood pressure (!) 145/100,  pulse 92, temperature 98.6 F (37 C), temperature source Oral, resp. rate 18, height 5' 9 (1.753 m), weight 72.6 kg, SpO2 98%. Body mass index is 23.63 kg/m.   COGNITIVE FEATURES THAT CONTRIBUTE TO RISK:  Loss of executive function    SUICIDE RISK:   Moderate:  Frequent suicidal ideation with limited intensity, and duration, some specificity in terms of plans, no associated intent, good self-control, limited dysphoria/symptomatology, some risk factors present, and identifiable protective factors, including available and accessible social support.  PLAN OF CARE: see H&P  I certify that inpatient services furnished can reasonably be expected to improve the patient's condition.   [name] Cornish, MD 08/05/2024, 5:14 PM

## 2024-08-05 NOTE — [facility] Group Notes (Signed)
 Kabe did not attend wrap up group.

## 2024-08-05 NOTE — Progress Notes (Signed)
 D:  Patient denied SI and HI, contracts for safety.  Patient does see shadow men and hears music.   A:  Medications administered per MD orders.  Emotional support and encouragement given patient. R:  Safety maintained with 15 minute checks.

## 2024-08-05 NOTE — Progress Notes (Signed)
(  Sleep Hours) - 7.75 (Any PRNs that were needed, meds refused, or side effects to meds)- PRN haldol  5 mg and benadryl  50 mg given per mild agitation protocol, no meds refused.  (Any disturbances and when (visitation, over night)- Pt was observed in room loudly talking/yelling to himself and responding to internal stimuli. Pt expressed agitation regarding auditory hallucinations and requested medication, mild prn PO agitation protocol given.  (Concerns raised by the patient)- None  (SI/HI/AVH)- Denies SI/HI. Endorses AVH of rats running across the floor and voices.

## 2024-08-05 NOTE — Group Note (Signed)
 Recreation Therapy Group Note   Group Topic:Problem Solving  Group Date: 08/05/2024 Start Time: 0940 End Time: 1010 Facilitators: Dianne Bady-McCall, LRT,CTRS Location: [address] Dayroom   Group Topic: Communication, Team Building, Problem Solving   Goal Area(s) Addresses:  Patient will effectively work with peer towards shared goal.  Patient will identify skills used to make activity successful.  Patient will identify how skills used during activity can be used to reach post d/c goals.    Behavioral Response:    Intervention: STEM Activity   Activity: Landing Pad. In teams of 3-5, patients were given 12 plastic drinking straws and an equal length of masking tape. Using the materials provided, patients were asked to build a landing pad to catch a golf ball dropped from approximately 5 feet in the air. All materials were required to be used by the team in their design. LRT facilitated post-activity discussion.   Education: Pharmacist, community, Scientist, physiological, Discharge Planning    Education Outcome: Acknowledges education/In group clarification offered/Needs additional education.    Affect/Mood: N/A   Participation Level: Did not attend    Clinical Observations/Individualized Feedback:     Plan: Continue to engage patient in RT group sessions 2-3x/week.   [name] [name], LRT,CTRS 08/05/2024 11:54 AM

## 2024-08-06 NOTE — Progress Notes (Signed)
 PRN, IM benadryl  50 mg, ativan  2 mg, and haldol  5 mg given to patient for agitation, pacing, and restlessness. Patient kept screaming in his room and in the hallway non stop. Patient tolerated medication well with no side effect noted. Q 15 minutes safety observation in place. Staff will continue to provide support to patient.

## 2024-08-06 NOTE — Plan of Care (Signed)
   Problem: Education: Goal: Knowledge of [place] [facility] Education information/materials will improve Outcome: Progressing   Problem: Safety: Goal: Periods of time without injury will increase Outcome: Progressing

## 2024-08-06 NOTE — Care Management Important Message (Signed)
 Medicare IM printed and given to social work to give to the patient. ?

## 2024-08-06 NOTE — Progress Notes (Addendum)
 [name] MD Progress Note  08/06/2024 3:56 PM [name]  MRN:  [id number]  Principal Problem: Alcohol  withdrawal syndrome with perceptual disturbance [name] [name]) Diagnosis: Principal Problem:   Alcohol  withdrawal syndrome with perceptual disturbance (HCC) Active Problems:   Schizoaffective disorder, depressive type (HCC)   Alcohol  use disorder, severe, dependence (HCC)   Reason for Admission:  [name] [name] is a 48 y.o. 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 Patient initially arrived to [facility] on 08/03/2024 for alcohol  withdrawal and hallucinations and passive SI, and admitted to [facility] on 08/04/2024 for acute safety concerns, crisis stabalization, acute suicidal or self-harming behaviors, impaired functioning, substance related issues, severe substance-induced psychosis or mood disturbances, intensive therapeutic interventions, and stabilization of acute on chronic psychiatric conditions.  (admitted on 08/04/2024, total  LOS: 2 days )   last 24 hours:  Persistently elevated BP 130-145 SBP / 90-102 DBP. [place] 186/168 that was lower on recheck suggesting it was error. CIWAs 19, 8, 12, 8; persistenly is having points for AVH. Normal heart rate. Got zofran  x2 yesterday. Agitation protocol at 0600 today for yelling and RTIS. Lorazepam  x2 for CIWA >10. Receiving all his scheduled medications.    Information Obtained Today During Patient Interview:  Reports that he is tolerating p.o. intake okay.  Reports that the AVH from his baseline have been present but not as debilitating as the alcohol  withdrawal syndrome symptoms.  Continues to report that is having nausea and tremors but denies other symptoms of alcohol  drawl syndrome.  Reports that he is sleeping okay and current contacts.  Reports that he is  not having thoughts to harm himself or others.  Reports that he is tolerating the Seroquel  being restarted without side effects.  Reports no excessive sedation during the day. Pt denies extrapyramidal symptoms including dystonia (sudden spastic contractions of muscle groups), parkinsonism (bradykinesia, tremors, rigidity), and akathisia (severe restlessness).  Still open to going to residential treatment.   Past Psychiatric Hx: Current Psychiatrist: none currently, was suppose to follow up with [facility] in [place] after hosp in July. Previously has been to [facility] in [place].  Current Therapist: none Previous Psychiatric Diagnoses: extensive diagnoses in the chart including numerous mood, psychotic, substance. Per pt schizoaffective disorder (type unknown).   Psychiatric Medications: Current Seroquel  per patient. Doesn't know dose Per pharm records, zyprexa  10 mg at bedtime Past Zyprexa  helps sleep but not AH Seroquel  helps sleep and AH  Psychiatric Hospitalization hx: numerous including several this year. Last hospitalization was in July at [facility].  ACT team hx: no history but interested Neuromodulation history: n/a  History of suicide: no attempts but extensive SI per chart History of homicide or aggression: denies   Substance Use Hx: Alcohol : 1/5/day over the last 7 days.  Reports history of alcohol  withdrawal seizures but none per chart review. Tobacco: vaping and cigarettes daily Cannabis: few hits a week Other Substances: substantial hx of meth, cocaine, opoid use. No use in the last several years.  Non-prescribed medications: n/a Rehab History: yes but not in the last several years   Past Medical History: PCP: none currently Medical Dx: none per patient Meds: none per patient Allergies: n/a Hospitalizations: none, numerous ED visit though Surgeries: n/a TBI: denies Seizures: denies   Family History: family history includes Alcohol  abuse in his father; Cancer in  his maternal aunt. No  primary psychotic disorder in family. Mother has anxiety.    Social History: Developmental: did not discuss Current Living Situation: Currently is in [place].  Was staying with a friend prior to admission.  Experiencing homelessness. Education: did not discuss Occupation: been on disability since early 69s, also on medicare Hobbies: exercise particularly walking Spirituality/religiosity: did not discuss Marital Status / Relationships: not in relationship currently Children: did not discuss Military: denies   Legal: denies any upcoming court dates.  Access to firearms: denies   Current Medications: Current Facility-Administered Medications  Medication Dose Route Frequency Provider Last Rate Last Admin   acetaminophen  (TYLENOL ) tablet 650 mg  650 mg Oral Q6H PRN Motley-Mangrum, Jadeka A, PMHNP   650 mg at 08/06/24 0834   alum & mag hydroxide-simeth (MAALOX/MYLANTA) 200-200-20 MG/5ML suspension 30 mL  30 mL Oral Q4H PRN Motley-Mangrum, Jadeka A, PMHNP       haloperidol  (HALDOL ) tablet 5 mg  5 mg Oral TID PRN Motley-Mangrum, Jadeka A, PMHNP   5 mg at 08/06/24 0645   And   diphenhydrAMINE  (BENADRYL ) capsule 50 mg  50 mg Oral TID PRN Motley-Mangrum, Jadeka A, PMHNP   50 mg at 08/06/24 0645   haloperidol  lactate (HALDOL ) injection 5 mg  5 mg Intramuscular TID PRN Motley-Mangrum, Jadeka A, PMHNP       And   diphenhydrAMINE  (BENADRYL ) injection 50 mg  50 mg Intramuscular TID PRN Motley-Mangrum, Jadeka A, PMHNP       And   LORazepam  (ATIVAN ) injection 2 mg  2 mg Intramuscular TID PRN Motley-Mangrum, Jadeka A, PMHNP       haloperidol  lactate (HALDOL ) injection 10 mg  10 mg Intramuscular TID PRN Motley-Mangrum, Jadeka A, PMHNP       And   diphenhydrAMINE  (BENADRYL ) injection 50 mg  50 mg Intramuscular TID PRN Motley-Mangrum, Jadeka A, PMHNP       And   LORazepam  (ATIVAN ) injection 2 mg  2 mg Intramuscular TID PRN Motley-Mangrum, Jadeka A, PMHNP       hydrOXYzine   (ATARAX ) tablet 25 mg  25 mg Oral Q6H PRN Motley-Mangrum, Jadeka A, PMHNP   25 mg at 08/06/24 0645   loperamide  (IMODIUM ) capsule 2-4 mg  2-4 mg Oral PRN Motley-Mangrum, Jadeka A, PMHNP       LORazepam  (ATIVAN ) injection 2 mg  2 mg Intramuscular Once PRN Darlyn Repsher, MD       LORazepam  (ATIVAN ) tablet 1 mg  1 mg Oral Q6H PRN Motley-Mangrum, Jadeka A, PMHNP   1 mg at 08/06/24 9167   magnesium  hydroxide (MILK OF MAGNESIA) suspension 30 mL  30 mL Oral Daily PRN Motley-Mangrum, Jadeka A, PMHNP       multivitamin with minerals tablet 1 tablet  1 tablet Oral Daily Motley-Mangrum, Jadeka A, PMHNP   1 tablet at 08/06/24 9167   nicotine  (NICODERM CQ  - dosed in mg/24 hours) patch 14 mg  14 mg Transdermal Daily Prentis Kitchens A, DO   14 mg at 08/06/24 0833   ondansetron  (ZOFRAN -ODT) disintegrating tablet 4 mg  4 mg Oral Q6H PRN Motley-Mangrum, Jadeka A, PMHNP   4 mg at 08/05/24 1724   QUEtiapine  (SEROQUEL ) tablet 100 mg  100 mg Oral Daily Karan Ramnauth, MD   100 mg at 08/06/24 9167   QUEtiapine  (SEROQUEL ) tablet 100 mg  100 mg Oral Daily Chelsey Redondo, MD   100 mg at 08/06/24 1512   QUEtiapine  (SEROQUEL ) tablet 200 mg  200 mg Oral QHS Rhyder Bratz, MD   200 mg at 08/05/24 2106  thiamine  (Vitamin B-1) tablet 100 mg  100 mg Oral Daily Motley-Mangrum, Jadeka A, PMHNP   100 mg at 08/06/24 9167    Lab Results: No results found for this or any previous visit (from the past 48 hours).  Blood Alcohol  level:  Lab Results  Component Value Date   ETH 87 (H) 08/03/2024   ETH <15 07/22/2024    Metabolic Labs: Lab Results  Component Value Date   HGBA1C 5.4 09/22/2021   MPG 108.28 09/22/2021   MPG 117 04/05/2021    Lab Results  Component Value Date   CHOL 170 10/10/2021   TRIG 86 10/10/2021   HDL 64 10/10/2021   CHOLHDL 2.7 10/10/2021   VLDL 17 10/10/2021   LDLCALC 89 10/10/2021   LDLCALC 66 09/22/2021    Physical Findings: AIMS: No  CIWA:  CIWA-Ar Total: 4   Psychiatric Specialty  Exam:  Presentation  General Appearance: Bizarre; Disheveled  Eye Contact:Fair  Speech:Normal Rate  Speech Volume:Normal  Mood and Affect  Mood:Dysphoric  Affect:Congruent   Thought Process  Thought Processes:Coherent; Linear  Descriptions of Associations:Intact  Orientation:Full (Time, Place and Person)  Thought Content:Logical; Paranoid Ideation  History of Schizophrenia/Schizoaffective disorder:Yes  Duration of Psychotic Symptoms:Greater than six months  Hallucinations:Hallucinations: Auditory; Visual; Tactile  Ideas of Reference:None  Suicidal Thoughts: Denies  Homicidal Thoughts:Homicidal Thoughts: No   Sensorium  Memory:Immediate Good; Recent Good; Remote Good  Judgment:Poor  Insight:Fair   Executive Functions  Concentration:Good  Attention Span:Good  Recall:Good  Fund of Knowledge:Good  Language:Good   Psychomotor Activity  Psychomotor Activity:Psychomotor Activity: Decreased (No EPS)   Assets  Assets:Desire for Improvement   Sleep  Sleep:Sleep: Poor (Due to hallucinations)    Physical Exam: Physical Exam Vitals and nursing note reviewed.  Constitutional:      General: He is not in acute distress.    Appearance: He is not ill-appearing.  HENT:     Head: Normocephalic and atraumatic.  Pulmonary:     Effort: Pulmonary effort is normal.  Neurological:     General: No focal deficit present.     Mental Status: He is alert.  Psychiatric:     Comments: No obvious EPS.    Review of Systems  Constitutional:  Negative for diaphoresis and fever.  Cardiovascular:  Negative for chest pain and palpitations.  Gastrointestinal:  Positive for nausea. Negative for constipation, diarrhea and vomiting.  Neurological:  Positive for tremors. Negative for dizziness, weakness and headaches.  Psychiatric/Behavioral:  Positive for hallucinations.    Blood pressure 134/84, pulse 95, temperature (!) 97.4 F (36.3 C), temperature source Oral,  resp. rate 18, height 5' 9 (1.753 m), weight 72.6 kg, SpO2 98%. Body mass index is 23.63 kg/m.   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    08/06/24 Alcohol  withdrawal syndrome is mostly involving nausea, tremors that are minimum, and hallucinations which are part of the patient's baseline but are making CIWA's look worse than they are.  Plan to continue with as  needed Ativan .  Patient still interested in residential treatment.  Patient is tolerating Seroquel  without side effects but is having some continued AVH and hopeful that this will improve over the next day or 2.  Doing no roommate as patient is yelling when responding to voices.   PLAN: Safety and Monitoring:             -- Voluntary admission to inpatient psychiatric unit for safety, stabilization and treatment             -- Daily contact with patient to assess and evaluate symptoms and progress in treatment             -- Patient's case to be discussed in multi-disciplinary team meeting             -- Observation Level : q15 minute checks             -- Vital signs:  q12 hours             -- Precautions: suicide, elopement, and assault   2. Psychiatric Treatment:  -- Continue Seroquel  100 mg once in the morning, 100 mg once in the afternoon, 200 mg once at bedtime for schizoaffective disorder               -- Continue trazodone  50 mg once nightly  as needed for insomnia             -- Continue hydroxyzine  25 mg 3 times daily as needed for anxiety   --  The risks/benefits/side-effects/alternatives to this medication were discussed in detail with the patient and time was given for questions. The patient consents to medication trial.              -- Metabolic profile and EKG monitoring obtained while on an atypical antipsychotic. See #4 below for values.              -- Encouraged patient to participate in unit milieu and in scheduled group therapies              -- Short Term Goals: Ability to identify changes in lifestyle to reduce recurrence of condition will improve, Ability to verbalize feelings will improve, Ability to disclose and discuss suicidal ideas, Ability to demonstrate self-control will improve, Ability to identify and develop effective coping behaviors will improve, Ability to maintain clinical measurements within normal limits will improve, Compliance with prescribed medications will improve, and Ability to identify triggers associated with substance abuse/mental health issues will improve             -- Long Term Goals: Improvement in symptoms so as ready for discharge                3. Medical Issues Being Addressed:              -- Start CIWA with Ativan  as needed for alcohol  drawl syndrome; will escalate this dose Ativan  if indicated               -- Continue PRN's: Tylenol , Maalox, Milk of Magnesia     4. Routine and other pertinent labs reviewed: EKG monitoring: QTc: 420  Metabolism / endocrine: BMI: Body mass index is 23.63 kg/m. Prolactin: No results found for: PROLACTIN Lipid Panel: Lab Results  Component Value Date   CHOL 170 10/10/2021   TRIG 86 10/10/2021   HDL 64 10/10/2021   CHOLHDL 2.7 10/10/2021   VLDL 17 10/10/2021   LDLCALC 89 10/10/2021  LDLCALC 66 09/22/2021   HbgA1c: Hgb A1c MFr Bld (%)  Date Value  09/22/2021 5.4   TSH: TSH (uIU/mL)  Date Value  09/22/2021 2.873  01/27/2013  0.344 (L)    Labs to order: none  5. Discharge Planning:              -- Social work and case management to assist with discharge planning and identification of [name] follow-up needs prior to discharge             -- Estimated LOS: 10/20             -- Discharge Concerns: Need to establish a safety plan; Medication compliance and effectiveness             -- Discharge Goals: Return home with outpatient referrals for mental health follow-up including medication management/psychotherapy    I certify that inpatient services furnished can reasonably be expected to improve the patient's condition.     Justino Cornish, MD PGY-2 Psychiatry Resident 08/06/2024, 3:56 PM

## 2024-08-06 NOTE — Progress Notes (Addendum)
 [name] [name]   Type of Note: Substance Use Treatment   [name] and [facility] referrals have been faxed this morning.   3:00PM: Received call from [place] at [facility], pt is not eligible for their program due to only having Medicare insurance. Pt must have [employer] in addition to Medicare.   Signed:  Arnel Wymer, LCSW-A 08/06/2024  11:19 AM

## 2024-08-06 NOTE — Progress Notes (Signed)
   08/06/24 1000  Psych Admission Type (Psych Patients Only)  Admission Status Voluntary  Psychosocial Assessment  Patient Complaints Anxiety;Agitation  Eye Contact Fair  Facial Expression Anxious;Pained  Affect Preoccupied  Speech Logical/coherent  Interaction Assertive  Motor Activity Fidgety  Appearance/Hygiene Disheveled  Behavior Characteristics Agitated;Fidgety  Mood Preoccupied  Thought Process  Coherency WDL  Content WDL  Delusions None reported or observed  Perception Hallucinations  Hallucination Auditory;Visual (denies but was yelling at someone when he was alone in his room this morning, reported to night shift he was seeing rats)  Judgment Limited  Confusion Mild  Danger to Self  Current suicidal ideation? Denies  Agreement Not to Harm Self Yes  Description of Agreement verbal  Danger to Others  Danger to Others None reported or observed

## 2024-08-06 NOTE — Plan of Care (Signed)
   Problem: Education: Goal: Emotional status will improve Outcome: Progressing Goal: Mental status will improve Outcome: Progressing Goal: Verbalization of understanding the information provided will improve Outcome: Progressing   Problem: Activity: Goal: Interest or engagement in activities will improve Outcome: Progressing

## 2024-08-06 NOTE — Plan of Care (Signed)
  Problem: Education: Goal: Emotional status will improve Outcome: Progressing   Problem: Activity: Goal: Sleeping patterns will improve Outcome: Progressing   Problem: Safety: Goal: Periods of time without injury will increase Outcome: Progressing   Problem: Activity: Goal: Interest or engagement in activities will improve Outcome: Not Progressing

## 2024-08-06 NOTE — Progress Notes (Signed)
(  Sleep Hours) - 10.25 (Any PRNs that were needed, meds refused, or side effects to meds)- Vistaril  25 mg  (Any disturbances and when (visitation, over night)- none  (Concerns raised by the patient)- none  (SI/HI/AVH)- denies

## 2024-08-06 NOTE — Progress Notes (Signed)
(  Sleep Hours) - 10.75 (Any PRNs that were needed, meds refused, or side effects to meds)- PRN ativan  1 mg given for CIWA score 12, no meds refused.  (Any disturbances and when (visitation, over night)- None  (Concerns raised by the patient)- None  (SI/HI/AVH)- Denies SI/HI. Endorses auditory and visual hallucinations. Can be observed by staff yelling in room and responding to internal stimuli.

## 2024-08-06 NOTE — Group Note (Signed)
 Date:  08/06/2024 Time:  8:34 PM  Group Topic/Focus:  Wrap-Up Group:   The focus of this group is to help patients review their daily goal of treatment and discuss progress on daily workbooks.    Participation Level:  Did Not Attend  Eaven Schwager [place] 08/06/2024, 8:34 PM

## 2024-08-06 NOTE — Progress Notes (Signed)
   08/05/24 2106  Psych Admission Type (Psych Patients Only)  Admission Status Voluntary  Psychosocial Assessment  Patient Complaints Anxiety;Agitation;Other (Comment) (AVH)  Eye Contact Fair  Facial Expression Anxious  Affect Preoccupied  Speech Logical/coherent  Interaction Isolative  Motor Activity Fidgety  Appearance/Hygiene Disheveled  Behavior Characteristics Cooperative  Mood Preoccupied  Thought Process  Coherency WDL  Content WDL  Delusions None reported or observed  Perception Hallucinations  Hallucination Auditory;Visual  Judgment Limited  Confusion Mild  Danger to Self  Current suicidal ideation? Denies  Agreement Not to Harm Self Yes  Description of Agreement Verbal  Danger to Others  Danger to Others None reported or observed

## 2024-08-07 NOTE — Progress Notes (Signed)
 [facility] MD Progress Note  08/07/2024 4:20 PM AXIEL FJELD  MRN:  [id number]  Principal Problem: Alcohol  withdrawal syndrome with perceptual disturbance [facility]) Diagnosis: Principal Problem:   Alcohol  withdrawal syndrome with perceptual disturbance (HCC) Active Problems:   Schizoaffective disorder, depressive type (HCC)   Alcohol  use disorder, severe, dependence (HCC)   Reason for Admission:  [name] [name] is a 48 y.o. 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 Patient initially arrived to [facility] on 08/03/2024 for alcohol  withdrawal and hallucinations and passive SI, and admitted to [facility] on 08/04/2024 for acute safety concerns, crisis stabalization, acute suicidal or self-harming behaviors, impaired functioning, substance related issues, severe substance-induced psychosis or mood disturbances, intensive therapeutic interventions, and stabilization of acute on chronic psychiatric conditions.  (admitted on 08/04/2024, total  LOS: 3 days )   last 24 hours:  BP borderline elevated multiple times yetserday but otherwise VSS. Taking scheduled meds. PRN tylenol  x1, agitation severe yesterday evening, ativan  x1 yesterday morning. Agitation protocol for agitation, pacing, restlessness, screaming in room, all this is nonstop, accepted protocol.   CIWA last 24:  8, 11, 4  Information Obtained Today During Patient Interview:  Asked patient about agitation and he reported that it was due to his withdrawal and not from hallucinations.  Encouraged patient reach out to nurse in the future if he is feeling off in order to not disturb others.  Reported that he feels like his alcohol  drawl is continue to improve.  He is still interested in doing residential treatment.  Denies side effects to medications. Pt denies  extrapyramidal symptoms including dystonia (sudden spastic contractions of muscle groups), parkinsonism (bradykinesia, tremors, rigidity), and akathisia (severe restlessness).  Denies AVH and paranoia.  For alcohol  withdrawl symptoms denies nausea, vomiting, diarrhea, diaphoresis, tremors, perceptual disturbances.  Tolerating p.o. intake and has good appetite.  Reports that he is sleeping okay.    Past Psychiatric Hx: Current Psychiatrist: none currently, was suppose to follow up with [facility] in [place] after hosp in July. Previously has been to [facility] in [place].  Current Therapist: none Previous Psychiatric Diagnoses: extensive diagnoses in the chart including numerous mood, psychotic, substance. Per pt schizoaffective disorder (type unknown).   Psychiatric Medications: Current Seroquel  per patient. Doesn't know dose Per pharm records, zyprexa  10 mg at bedtime Past Zyprexa  helps sleep but not AH Seroquel  helps sleep and AH  Psychiatric Hospitalization hx: numerous including several this year. Last hospitalization was in July at [facility].  ACT team hx: no history but interested Neuromodulation history: n/a  History of suicide: no attempts but extensive SI per chart History of homicide or aggression: denies   Substance Use Hx: Alcohol : 1/5/day over the last 7 days.  Reports history of alcohol  withdrawal seizures but none per chart review. Tobacco: vaping and cigarettes daily Cannabis: few hits a week Other Substances: substantial hx of meth, cocaine, opoid use. No use in the last several years.  Non-prescribed medications: n/a Rehab History: yes but not in the last several years   Past Medical History: PCP: none currently Medical Dx: none per patient Meds: none per patient Allergies: n/a Hospitalizations: none, numerous ED visit though Surgeries: n/a TBI: denies Seizures: denies   Family History: family history includes Alcohol  abuse in his father; Cancer in his  maternal aunt. No primary psychotic disorder in family. Mother has anxiety.  Social History: Developmental: did not discuss Current Living Situation: Currently is in [place].  Was staying with a friend prior to admission.  Experiencing homelessness. Education: did not discuss Occupation: been on disability since early 55s, also on medicare Hobbies: exercise particularly walking Spirituality/religiosity: did not discuss Marital Status / Relationships: not in relationship currently Children: did not discuss Military: denies   Legal: denies any upcoming court dates.  Access to firearms: denies   Current Medications: Current Facility-Administered Medications  Medication Dose Route Frequency Provider Last Rate Last Admin   acetaminophen  (TYLENOL ) tablet 650 mg  650 mg Oral Q6H PRN Motley-Mangrum, Jadeka A, PMHNP   650 mg at 08/07/24 1129   alum & mag hydroxide-simeth (MAALOX/MYLANTA) 200-200-20 MG/5ML suspension 30 mL  30 mL Oral Q4H PRN Motley-Mangrum, Jadeka A, PMHNP       haloperidol  (HALDOL ) tablet 5 mg  5 mg Oral TID PRN Motley-Mangrum, Jadeka A, PMHNP   5 mg at 08/06/24 0645   And   diphenhydrAMINE  (BENADRYL ) capsule 50 mg  50 mg Oral TID PRN Motley-Mangrum, Jadeka A, PMHNP   50 mg at 08/06/24 0645   haloperidol  lactate (HALDOL ) injection 5 mg  5 mg Intramuscular TID PRN Motley-Mangrum, Jadeka A, PMHNP   5 mg at 08/06/24 1725   And   diphenhydrAMINE  (BENADRYL ) injection 50 mg  50 mg Intramuscular TID PRN Motley-Mangrum, Jadeka A, PMHNP       And   LORazepam  (ATIVAN ) injection 2 mg  2 mg Intramuscular TID PRN Motley-Mangrum, Jadeka A, PMHNP       haloperidol  lactate (HALDOL ) injection 10 mg  10 mg Intramuscular TID PRN Motley-Mangrum, Jadeka A, PMHNP       And   diphenhydrAMINE  (BENADRYL ) injection 50 mg  50 mg Intramuscular TID PRN Motley-Mangrum, Jadeka A, PMHNP   50 mg at 08/06/24 1726   And   LORazepam  (ATIVAN ) injection 2 mg  2 mg Intramuscular TID PRN Motley-Mangrum,  Jadeka A, PMHNP       hydrOXYzine  (ATARAX ) tablet 25 mg  25 mg Oral Q6H PRN Motley-Mangrum, Jadeka A, PMHNP   25 mg at 08/07/24 1129   loperamide  (IMODIUM ) capsule 2-4 mg  2-4 mg Oral PRN Motley-Mangrum, Jadeka A, PMHNP       LORazepam  (ATIVAN ) tablet 1 mg  1 mg Oral Q6H PRN Motley-Mangrum, Jadeka A, PMHNP   1 mg at 08/06/24 9167   magnesium  hydroxide (MILK OF MAGNESIA) suspension 30 mL  30 mL Oral Daily PRN Motley-Mangrum, Jadeka A, PMHNP       multivitamin with minerals tablet 1 tablet  1 tablet Oral Daily Motley-Mangrum, Jadeka A, PMHNP   1 tablet at 08/07/24 0802   nicotine  (NICODERM CQ  - dosed in mg/24 hours) patch 14 mg  14 mg Transdermal Daily Prentis Kitchens A, DO   14 mg at 08/07/24 0803   ondansetron  (ZOFRAN -ODT) disintegrating tablet 4 mg  4 mg Oral Q6H PRN Motley-Mangrum, Jadeka A, PMHNP   4 mg at 08/05/24 1724   QUEtiapine  (SEROQUEL ) tablet 100 mg  100 mg Oral Daily Margarita Bobrowski, MD   100 mg at 08/07/24 0802   QUEtiapine  (SEROQUEL ) tablet 100 mg  100 mg Oral Daily Minah Axelrod, MD   100 mg at 08/07/24 1501   QUEtiapine  (SEROQUEL ) tablet 200 mg  200 mg Oral QHS Tani Virgo, MD   200 mg at 08/06/24 2045   thiamine  (Vitamin B-1) tablet 100 mg  100 mg Oral Daily Motley-Mangrum, Jadeka A, PMHNP   100 mg at 08/07/24 [phone]  Lab Results: No results found for this or any previous visit (from the past 48 hours).  Blood Alcohol  level:  Lab Results  Component Value Date   ETH 87 (H) 08/03/2024   ETH <15 07/22/2024    Metabolic Labs: Lab Results  Component Value Date   HGBA1C 5.4 09/22/2021   MPG 108.28 09/22/2021   MPG 117 04/05/2021    Lab Results  Component Value Date   CHOL 170 10/10/2021   TRIG 86 10/10/2021   HDL 64 10/10/2021   CHOLHDL 2.7 10/10/2021   VLDL 17 10/10/2021   LDLCALC 89 10/10/2021   LDLCALC 66 09/22/2021    Physical Findings: AIMS: No    Psychiatric Specialty Exam:  Presentation  General Appearance: Bizarre; Disheveled  Eye  Contact:Fair  Speech:Normal Rate  Speech Volume:Normal  Mood and Affect  Mood:Dysphoric  Affect:Congruent   Thought Process  Thought Processes:Coherent; Linear  Descriptions of Associations:Intact  Orientation:Full (Time, Place and Person)  Thought Content:Logical; Paranoid Ideation  History of Schizophrenia/Schizoaffective disorder:Yes  Duration of Psychotic Symptoms:Greater than six months  Hallucinations:No data recorded  Ideas of Reference:None  Suicidal Thoughts: Denies  Homicidal Thoughts:No data recorded   Sensorium  Memory:Immediate Good; Recent Good; Remote Good  Judgment:Poor  Insight:Fair   Executive Functions  Concentration:Good  Attention Span:Good  Recall:Good  Fund of Knowledge:Good  Language:Good   Psychomotor Activity  Psychomotor Activity:No data recorded   Assets  Assets:Desire for Improvement   Sleep  Sleep:No data recorded    Physical Exam: Physical Exam Vitals and nursing note reviewed.  Constitutional:      General: He is not in acute distress.    Appearance: He is not ill-appearing.  HENT:     Head: Normocephalic and atraumatic.  Pulmonary:     Effort: Pulmonary effort is normal.  Neurological:     General: No focal deficit present.     Mental Status: He is alert.  Psychiatric:     Comments: No obvious EPS.    Review of Systems  Constitutional:  Negative for diaphoresis and fever.  Cardiovascular:  Negative for chest pain and palpitations.  Gastrointestinal:  Positive for nausea. Negative for constipation, diarrhea and vomiting.  Neurological:  Positive for tremors. Negative for dizziness, weakness and headaches.  Psychiatric/Behavioral:  Positive for hallucinations.    Blood pressure 132/82, pulse 85, temperature (!) 97.4 F (36.3 C), temperature source Oral, resp. rate 18, height 5' 9 (1.753 m), weight 72.6 kg, SpO2 98%. Body mass index is 23.63 kg/m.   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    08/07/24 Alcohol  drawl continue improved.  Still having hallucinations but consistent with his baseline schizoaffective disorder.  Suspect that they will improve with by several days as he has the Seroquel  in the system.  Given he responded to this dose Seroquel  at discharge last time with worse hallucinations suspect that he can remain on this dose to achieve therapeutic.    PLAN: Safety and Monitoring:             --  Voluntary admission to inpatient psychiatric unit for safety, stabilization and treatment             -- Daily contact with patient to assess and evaluate symptoms and progress in treatment             -- Patient's case to be discussed in multi-disciplinary team meeting             -- Observation Level : q15 minute checks             -- Vital signs:  q12 hours             -- Precautions: suicide, elopement, and assault   2. Psychiatric Treatment:  -- Continue Seroquel  100 mg once in the morning, 100 mg once in the afternoon, 200 mg once at bedtime for schizoaffective disorder               -- Continue trazodone  50 mg once nightly as needed for insomnia             -- Continue hydroxyzine  25 mg 3 times daily as needed for anxiety   --  The risks/benefits/side-effects/alternatives to this medication were discussed in detail with the patient and time was given for questions. The patient  consents to medication trial.              -- Metabolic profile and EKG monitoring obtained while on an atypical antipsychotic. See #4 below for values.              -- Encouraged patient to participate in unit milieu and in scheduled group therapies              -- Short Term Goals: Ability to identify changes in lifestyle to reduce recurrence of condition will improve, Ability to verbalize feelings will improve, Ability to disclose and discuss suicidal ideas, Ability to demonstrate self-control will improve, Ability to identify and develop effective coping behaviors will improve, Ability to maintain clinical measurements within normal limits will improve, Compliance with prescribed medications will improve, and Ability to identify triggers associated with substance abuse/mental health issues will improve             -- Long Term Goals: Improvement in symptoms so as ready for discharge                3. Medical Issues Being Addressed:              -- Start CIWA with Ativan  as needed for alcohol  drawl syndrome; will escalate this dose Ativan  if indicated               -- Continue PRN's: Tylenol , Maalox, Milk of Magnesia     4. Routine and other pertinent labs reviewed: EKG monitoring: QTc: 420  Metabolism / endocrine: BMI: Body mass index is 23.63 kg/m.  Lipid Panel: Lab Results  Component Value Date   CHOL 170 10/10/2021   TRIG 86 10/10/2021   HDL 64 10/10/2021   CHOLHDL 2.7 10/10/2021   VLDL 17 10/10/2021   LDLCALC 89 10/10/2021   LDLCALC 66 09/22/2021   HbgA1c: Hgb A1c MFr Bld (%)  Date Value  09/22/2021 5.4   TSH: TSH (uIU/mL)  Date Value  09/22/2021 2.873  01/27/2013 0.344 (L)    Labs to order: none  5. Discharge Planning:              -- Social work and case management to assist with discharge planning and identification of hospital  follow-up needs prior to discharge             -- Estimated LOS: 10/20             -- Discharge Concerns: Need to establish a safety  plan; Medication compliance and effectiveness             -- Discharge Goals: Return home with outpatient referrals for mental health follow-up including medication management/psychotherapy    I certify that inpatient services furnished can reasonably be expected to improve the patient's condition.     Justino Cornish, MD PGY-2 Psychiatry Resident 08/07/2024, 4:20 PM

## 2024-08-07 NOTE — Group Note (Signed)
 Date:  08/07/2024 Time:  8:32 PM  Group Topic/Focus:  Wrap-Up Group:   The focus of this group is to help patients review their daily goal of treatment and discuss progress on daily workbooks.    Participation Level:  Active  Participation Quality:  Appropriate and Attentive  Affect:  Appropriate  Cognitive:  Alert and Appropriate  Insight: Appropriate and Good  Engagement in Group:  Engaged  Modes of Intervention:  Discussion and Education  Additional Comments:  Pt attended and participated in wrap up group this evening and rated their day a 6/10. Pt stated that they read and was able to make to one group today. Pt goal was to contact a treatment facility ([facility]) in which they were successful in doing. Pt has no concerns to relay at this time.   [name] [name] 08/07/2024, 8:32 PM

## 2024-08-07 NOTE — Progress Notes (Signed)

## 2024-08-07 NOTE — Plan of Care (Signed)
   Problem: Education: Goal: Emotional status will improve Outcome: Progressing Goal: Mental status will improve Outcome: Progressing Goal: Verbalization of understanding the information provided will improve Outcome: Progressing

## 2024-08-07 NOTE — Group Note (Signed)
 Recreation Therapy Group Note   Group Topic:Health and Wellness  Group Date: 08/07/2024 Start Time: 1020 End Time: 1047 Facilitators: Kastin Cerda-McCall, LRT,CTRS Location: [address] Dayroom   Group Topic: Wellness  Goal Area(s) Addresses:  Patient will define components of whole wellness. Patient will verbalize benefit of whole wellness.  Behavioral Response:   Intervention: Music  Activity: Exercise. Patients took turns leading group in the exercises of their choosing. Patients determined the difficulty of the exercises presented in group. Patients were encouraged not to do any exercise that was to difficult or aggravated any previous injuries. Patients were also encouraged to take breaks or get water  as needed.     Education: Wellness, Building control surveyor.   Education Outcome: Acknowledges education/In group clarification offered/Needs additional education.    Affect/Mood: N/A   Participation Level: Did not attend    Clinical Observations/Individualized Feedback:      Plan: Continue to engage patient in RT group sessions 2-3x/week.   Maripaz Mullan-McCall, LRT,CTRS 08/07/2024 12:50 PM

## 2024-08-07 NOTE — Progress Notes (Signed)
(  Sleep Hours) -5.5  (Any PRNs that were needed, meds refused, or side effects to meds)- Haldol  5 mg , Benadryl  50 mg  (Any disturbances and when (visitation, over night)- pt delusional , responding at times appearing confused at times  (Concerns raised by the patient)- hallucinations  (SI/HI/AVH)- AVH

## 2024-08-07 NOTE — Progress Notes (Signed)
 Pt pacing the unit talking to people unseen, pt getting agitated and appearing confused at times

## 2024-08-07 NOTE — Progress Notes (Signed)
   08/07/24 1129  Psych Admission Type (Psych Patients Only)  Admission Status Voluntary  Psychosocial Assessment  Patient Complaints Anxiety  Eye Contact Fair  Facial Expression Anxious  Affect Preoccupied  Speech Logical/coherent  Interaction Assertive  Motor Activity Slow  Appearance/Hygiene Disheveled  Behavior Characteristics Anxious  Mood Anxious  Thought Process  Coherency WDL  Content WDL  Delusions None reported or observed  Perception Hallucinations  Hallucination Auditory;Visual  Judgment Impaired  Confusion None  Danger to Self  Current suicidal ideation? Denies  Agreement Not to Harm Self Yes  Description of Agreement Verbal  Danger to Others  Danger to Others None reported or observed

## 2024-08-07 NOTE — Group Note (Signed)
 Date:  08/07/2024 Time:  9:08 AM  Group Topic/Focus:  Goals Group:   The focus of this group is to help patients establish daily goals to achieve during treatment and discuss how the patient can incorporate goal setting into their daily lives to aide in recovery.    Participation Level:  Did Not Attend   [name] [name] 08/07/2024, 9:08 AM

## 2024-08-07 NOTE — Plan of Care (Signed)
  Problem: Education: Goal: Emotional status will improve Outcome: Progressing   Problem: Activity: Goal: Interest or engagement in activities will improve Outcome: Progressing Goal: Sleeping patterns will improve Outcome: Progressing   Problem: Safety: Goal: Periods of time without injury will increase Outcome: Progressing   Problem: Safety: Goal: Ability to remain free from injury will improve Outcome: Progressing

## 2024-08-08 MED ORDER — GABAPENTIN 100 MG PO CAPS
100.0000 mg | ORAL_CAPSULE | Freq: Three times a day (TID) | ORAL | Status: DC
  Administered 2024-08-08 – 2024-08-09 (×5): 100 mg via ORAL
  Filled 2024-08-08 (×5): qty 1

## 2024-08-08 MED ORDER — HYDROXYZINE HCL 25 MG PO TABS
25.0000 mg | ORAL_TABLET | Freq: Three times a day (TID) | ORAL | Status: DC | PRN
  Administered 2024-08-08 – 2024-08-10 (×4): 25 mg via ORAL
  Filled 2024-08-08 (×2): qty 10
  Filled 2024-08-08 (×4): qty 1

## 2024-08-08 NOTE — Group Note (Signed)
 LCSW Group Therapy Note  Group Date: 08/08/2024 Start Time: 1005 End Time: 1105  Type of Therapy and Topic:  Group Therapy - Healthy vs Unhealthy Coping Skills - Museum/gallery exhibitions officer in Life.  Participation Level:  Did Not Attend   [name] [name] 08/08/2024  4:14 PM

## 2024-08-08 NOTE — Plan of Care (Signed)
  Problem: Education: Goal: Knowledge of [facility] Education information/materials will improve Outcome: Progressing Goal: Emotional status will improve Outcome: Progressing   Problem: Activity: Goal: Interest or engagement in activities will improve Outcome: Progressing Goal: Sleeping patterns will improve Outcome: Progressing

## 2024-08-08 NOTE — Progress Notes (Signed)
 [facility] MD Progress Note  08/08/2024 1:42 PM [name] [name]  MRN:  [id number]  Principal Problem: Alcohol  withdrawal syndrome with perceptual disturbance [facility]) Diagnosis: Principal Problem:   Alcohol  withdrawal syndrome with perceptual disturbance (HCC) Active Problems:   Schizoaffective disorder, depressive type (HCC)   Alcohol  use disorder, severe, dependence (HCC)   Reason for Admission:  [name] [name] is a 48 y.o. 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 Patient initially arrived to [facility] on 08/03/2024 for alcohol  withdrawal and hallucinations and passive SI, and admitted to [facility] on 08/04/2024 for acute safety concerns, crisis stabalization, acute suicidal or self-harming behaviors, impaired functioning, substance related issues, severe substance-induced psychosis or mood disturbances, intensive therapeutic interventions, and stabilization of acute on chronic psychiatric conditions.  (admitted on 08/04/2024, total  LOS: 4 days )   last 24 hours:  VSS. Slept 5.5 hours. Per night RN, delusional at times and RTIS and confused appearing at times and endorses AVH. Received agitation around bedtime yesterday. Not attending groups.   CIWA last 24:  4, 2, 1, 1  Information Obtained Today During Patient Interview:  Reports today that he called [facility] and they said that we can send them a referral.  Reports his alcohol  withdrawal symptoms are improving and denies any nausea, vomiting, diarrhea, diaphoresis, tremors.  Reports he is not having AVH in the last 2 days since alcohol  withdrawal has been improving (inconsistent with nurse reporting).  Reports that he has been eating okay again.  Reports that he is tolerating the Seroquel  without side effects.  Asked if he is still yelling at night and he says  now.    Past Psychiatric Hx: Current Psychiatrist: none currently, was suppose to follow up with [facility] in [place] after hosp in July. Previously has been to [facility] in [place].  Current Therapist: none Previous Psychiatric Diagnoses: extensive diagnoses in the chart including numerous mood, psychotic, substance. Per pt schizoaffective disorder (type unknown).   Psychiatric Medications: Current Seroquel  per patient. Doesn't know dose Per pharm records, zyprexa  10 mg at bedtime Past Zyprexa  helps sleep but not AH Seroquel  helps sleep and AH  Psychiatric Hospitalization hx: numerous including several this year. Last hospitalization was in July at [facility].  ACT team hx: no history but interested Neuromodulation history: n/a  History of suicide: no attempts but extensive SI per chart History of homicide or aggression: denies   Substance Use Hx: Alcohol : 1/5/day over the last 7 days.  Reports history of alcohol  withdrawal seizures but none per chart review. Tobacco: vaping and cigarettes daily Cannabis: few hits a week Other Substances: substantial hx of meth, cocaine, opoid use. No use in the last several years.  Non-prescribed medications: n/a Rehab History: yes but not in the last several years   Past Medical History: PCP: none currently Medical Dx: none per patient Meds: none per patient Allergies: n/a Hospitalizations: none, numerous ED visit though Surgeries: n/a TBI: denies Seizures: denies   Family History: family history includes Alcohol  abuse in his father; Cancer in his maternal aunt. No primary psychotic disorder in family. Mother has anxiety.    Social History: Developmental: did not discuss Current Living Situation: Currently is in [place].  Was staying with a friend prior to admission.  Experiencing homelessness. Education: did not discuss Occupation: been on disability since early 20s, also on medicare Hobbies: exercise  particularly  walking Spirituality/religiosity: did not discuss Marital Status / Relationships: not in relationship currently Children: did not discuss Military: denies   Legal: denies any upcoming court dates.  Access to firearms: denies   Current Medications: Current Facility-Administered Medications  Medication Dose Route Frequency Provider Last Rate Last Admin   acetaminophen  (TYLENOL ) tablet 650 mg  650 mg Oral Q6H PRN Motley-Mangrum, Jadeka A, PMHNP   650 mg at 08/07/24 1129   alum & mag hydroxide-simeth (MAALOX/MYLANTA) 200-200-20 MG/5ML suspension 30 mL  30 mL Oral Q4H PRN Motley-Mangrum, Jadeka A, PMHNP       haloperidol  (HALDOL ) tablet 5 mg  5 mg Oral TID PRN Motley-Mangrum, Jadeka A, PMHNP   5 mg at 08/07/24 2037   And   diphenhydrAMINE  (BENADRYL ) capsule 50 mg  50 mg Oral TID PRN Motley-Mangrum, Jadeka A, PMHNP   50 mg at 08/07/24 2037   haloperidol  lactate (HALDOL ) injection 5 mg  5 mg Intramuscular TID PRN Motley-Mangrum, Jadeka A, PMHNP   5 mg at 08/06/24 1725   And   diphenhydrAMINE  (BENADRYL ) injection 50 mg  50 mg Intramuscular TID PRN Motley-Mangrum, Jadeka A, PMHNP       And   LORazepam  (ATIVAN ) injection 2 mg  2 mg Intramuscular TID PRN Motley-Mangrum, Jadeka A, PMHNP       haloperidol  lactate (HALDOL ) injection 10 mg  10 mg Intramuscular TID PRN Motley-Mangrum, Jadeka A, PMHNP       And   diphenhydrAMINE  (BENADRYL ) injection 50 mg  50 mg Intramuscular TID PRN Motley-Mangrum, Jadeka A, PMHNP   50 mg at 08/06/24 1726   And   LORazepam  (ATIVAN ) injection 2 mg  2 mg Intramuscular TID PRN Motley-Mangrum, Jadeka A, PMHNP       gabapentin  (NEURONTIN ) capsule 100 mg  100 mg Oral TID Jasenia Weilbacher, MD   100 mg at 08/08/24 1244   hydrOXYzine  (ATARAX ) tablet 25 mg  25 mg Oral TID PRN Bobbitt, Shalon E, NP       magnesium  hydroxide (MILK OF MAGNESIA) suspension 30 mL  30 mL Oral Daily PRN Motley-Mangrum, Jadeka A, PMHNP       multivitamin with minerals tablet 1 tablet  1 tablet Oral Daily  Motley-Mangrum, Jadeka A, PMHNP   1 tablet at 08/08/24 0815   nicotine  (NICODERM CQ  - dosed in mg/24 hours) patch 14 mg  14 mg Transdermal Daily Prentis Kitchens A, DO   14 mg at 08/08/24 0815   QUEtiapine  (SEROQUEL ) tablet 100 mg  100 mg Oral Daily Minnette Merida, MD   100 mg at 08/08/24 0815   QUEtiapine  (SEROQUEL ) tablet 100 mg  100 mg Oral Daily Solace Manwarren, MD   100 mg at 08/07/24 1501   QUEtiapine  (SEROQUEL ) tablet 200 mg  200 mg Oral QHS Basha Krygier, MD   200 mg at 08/07/24 2037   thiamine  (Vitamin B-1) tablet 100 mg  100 mg Oral Daily Motley-Mangrum, Jadeka A, PMHNP   100 mg at 08/08/24 0815    Lab Results: No results found for this or any previous visit (from the past 48 hours).  Blood Alcohol  level:  Lab Results  Component Value Date   ETH 87 (H) 08/03/2024   ETH <15 07/22/2024    Metabolic Labs: Lab Results  Component Value Date   HGBA1C 5.4 09/22/2021   MPG 108.28 09/22/2021   MPG 117 04/05/2021    Lab Results  Component Value Date   CHOL 170 10/10/2021   TRIG 86 10/10/2021   HDL 64 10/10/2021  CHOLHDL 2.7 10/10/2021   VLDL 17 10/10/2021   LDLCALC 89 10/10/2021   LDLCALC 66 09/22/2021    Physical Findings: AIMS: No    Psychiatric Specialty Exam:  Presentation  General Appearance: Bizarre; Disheveled  Eye Contact:Fair  Speech:Normal Rate  Speech Volume:Normal  Mood and Affect  Mood:Dysphoric  Affect:Congruent   Thought Process  Thought Processes:Coherent; Linear  Descriptions of Associations:Intact  Orientation:Full (Time, Place and Person)  Thought Content:Logical; Paranoid Ideation  History of Schizophrenia/Schizoaffective disorder:Yes  Duration of Psychotic Symptoms:Greater than six months  Hallucinations: per nursing is RTIS and yelling. Pt denies  Ideas of Reference:None  Suicidal Thoughts: Denies  Homicidal Thoughts: denies   Sensorium  Memory:Immediate Good; Recent Good; Remote  Good  Judgment:Poor  Insight:Fair   Executive Functions  Concentration:Good  Attention Span:Good  Recall:Good  Fund of Knowledge:Good  Language:Good   Psychomotor Activity  Psychomotor Activity: normal   Assets  Assets:Desire for Improvement   Sleep  Sleep:No data recorded    Physical Exam: Physical Exam Vitals and nursing note reviewed.  Constitutional:      General: He is not in acute distress.    Appearance: He is not ill-appearing.  HENT:     Head: Normocephalic and atraumatic.  Pulmonary:     Effort: Pulmonary effort is normal.  Neurological:     General: No focal deficit present.     Mental Status: He is alert.  Psychiatric:     Comments: No obvious EPS.    Review of Systems  Constitutional:  Negative for diaphoresis and fever.  Cardiovascular:  Negative for chest pain and palpitations.  Gastrointestinal:  Positive for nausea. Negative for constipation, diarrhea and vomiting.  Neurological:  Positive for tremors. Negative for dizziness, weakness and headaches.  Psychiatric/Behavioral:  Positive for hallucinations.    Blood pressure 119/78, pulse 71, temperature (!) 97.2 F (36.2 C), temperature source Oral, resp. rate 18, height 5' 9 (1.753 m), weight 72.6 kg, SpO2 98%. Body mass index is 23.63 kg/m.   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     08/08/24 Minimum alcohol  withdrawal symptoms.  Continue to get agitation protocols for his responding to internal stimuli and yelling.  Patient does not have good insight into this.  Patient called [facility] and they said to have our social workers make referral.  Patient should be as baseline by Monday for door to door transfer or whenever he gets in, given his high risk for return to use without a door to door.   PLAN: Safety and Monitoring:             -- Voluntary admission to inpatient psychiatric unit for safety, stabilization and treatment             -- Daily contact with patient to assess and evaluate symptoms and progress in treatment             -- Patient's case to be discussed in multi-disciplinary team meeting             --  Observation Level : q15 minute checks             -- Vital signs:  q12 hours             -- Precautions: suicide, elopement, and assault   2. Psychiatric Treatment:  -- Continue Seroquel  100 mg once in the morning, 100 mg once in the afternoon, 200 mg once at bedtime for schizoaffective disorder               -- Continue trazodone  50 mg once nightly as needed for insomnia             -- Continue hydroxyzine  25 mg 3 times daily as needed for anxiety   --  The risks/benefits/side-effects/alternatives to this medication were discussed in detail with the patient and time was given for questions. The patient consents to medication trial.              -- Metabolic profile and EKG monitoring obtained while on an atypical antipsychotic. See #4 below for values.              -- Encouraged patient to participate in unit milieu and in scheduled group therapies              -- Short Term Goals: Ability to identify changes in lifestyle to reduce recurrence of condition will improve, Ability to verbalize feelings will improve, Ability to disclose and discuss suicidal ideas, Ability to demonstrate self-control will improve, Ability to identify and develop effective coping  behaviors will improve, Ability to maintain clinical measurements within normal limits will improve, Compliance with prescribed medications will improve, and Ability to identify triggers associated with substance abuse/mental health issues will improve             -- Long Term Goals: Improvement in symptoms so as ready for discharge                3. Medical Issues Being Addressed:              -- Start CIWA with Ativan  as needed for alcohol  drawl syndrome; will escalate this dose Ativan  if indicated               -- Continue PRN's: Tylenol , Maalox, Milk of Magnesia     4. Routine and other pertinent labs reviewed: EKG monitoring: QTc: 420  Metabolism / endocrine: BMI: Body mass index is 23.63 kg/m.  Lipid Panel: Lab Results  Component Value Date   CHOL 170 10/10/2021   TRIG 86 10/10/2021   HDL 64 10/10/2021   CHOLHDL 2.7 10/10/2021   VLDL 17 10/10/2021   LDLCALC 89 10/10/2021   LDLCALC 66 09/22/2021   HbgA1c: Hgb A1c MFr Bld (%)  Date Value  09/22/2021 5.4   TSH: TSH (uIU/mL)  Date Value  09/22/2021 2.873  01/27/2013 0.344 (L)    Labs to order: none  5. Discharge Planning:              -- Social work and case management to assist with discharge planning and identification of hospital follow-up needs prior to discharge             -- Estimated LOS: 10/20             -- Discharge Concerns: Need to establish a safety plan; Medication compliance and effectiveness             -- Discharge Goals: Return home with outpatient referrals for mental health follow-up including medication  management/psychotherapy    I certify that inpatient services furnished can reasonably be expected to improve the patient's condition.     Justino Cornish, MD PGY-2 Psychiatry Resident 08/08/2024, 1:42 PM

## 2024-08-08 NOTE — Progress Notes (Signed)
   08/08/24 0820  Psych Admission Type (Psych Patients Only)  Admission Status Voluntary  Psychosocial Assessment  Patient Complaints Anxiety  Eye Contact Fair  Facial Expression Anxious  Affect Preoccupied  Speech Logical/coherent  Interaction Assertive  Motor Activity Slow  Appearance/Hygiene Disheveled  Behavior Characteristics Restless  Mood Preoccupied  Thought Process  Coherency WDL  Content WDL  Delusions None reported or observed  Perception Hallucinations  Hallucination Auditory  Judgment Limited  Confusion WDL  Danger to Self  Current suicidal ideation? Denies  Danger to Others  Danger to Others None reported or observed

## 2024-08-08 NOTE — BHH Group Notes (Signed)
 Adult Psychoeducational Group Note  Date:  08/08/2024 Time:  8:37 PM  Group Topic/Focus:  Wrap-Up Group:   The focus of this group is to help patients review their daily goal of treatment and discuss progress on daily workbooks.  Participation Level:  Active  Participation Quality:  Appropriate  Affect:  Appropriate  Cognitive:  Appropriate  Insight: Appropriate  Engagement in Group:  Engaged  Modes of Intervention:  Discussion  Additional Comments:  Pt told that today was an okay day on the unit, the highlight of which was reading a book. On the subject of ways to stay well upon discharge, Pt mentioned wanting to continue his therapy at a Treatment Center and had a lead on this. Pt rated his day a 5 out of 10.  [name] 08/08/2024, 8:37 PM

## 2024-08-09 DIAGNOSIS — F251 Schizoaffective disorder, depressive type: Secondary | ICD-10-CM

## 2024-08-09 DIAGNOSIS — F10232 Alcohol dependence with withdrawal with perceptual disturbance: Principal | ICD-10-CM

## 2024-08-09 MED ORDER — GABAPENTIN 100 MG PO CAPS
200.0000 mg | ORAL_CAPSULE | Freq: Three times a day (TID) | ORAL | Status: DC
Start: 2024-08-09 — End: 2024-08-10
  Administered 2024-08-09 – 2024-08-10 (×2): 200 mg via ORAL
  Filled 2024-08-09 (×2): qty 2

## 2024-08-09 MED ORDER — OLANZAPINE 10 MG PO TBDP
10.0000 mg | ORAL_TABLET | Freq: Three times a day (TID) | ORAL | Status: DC | PRN
  Administered 2024-08-09: 10 mg via ORAL
  Filled 2024-08-09: qty 1

## 2024-08-09 MED ORDER — GABAPENTIN 100 MG PO CAPS
100.0000 mg | ORAL_CAPSULE | Freq: Once | ORAL | Status: AC
Start: 2024-08-09 — End: 2024-08-09
  Administered 2024-08-09: 100 mg via ORAL
  Filled 2024-08-09: qty 1

## 2024-08-09 MED ORDER — OLANZAPINE 10 MG IM SOLR
10.0000 mg | Freq: Three times a day (TID) | INTRAMUSCULAR | Status: DC | PRN
  Administered 2024-08-10: 10 mg via INTRAMUSCULAR
  Filled 2024-08-09: qty 10

## 2024-08-09 MED ORDER — OLANZAPINE 10 MG IM SOLR: 5.0000 mg | Freq: Three times a day (TID) | INTRAMUSCULAR | Status: DC | PRN

## 2024-08-09 MED ORDER — GABAPENTIN 100 MG PO CAPS: 100.0000 mg | ORAL_CAPSULE | Freq: Three times a day (TID) | ORAL | Status: DC

## 2024-08-09 MED ORDER — OLANZAPINE 5 MG PO TBDP
5.0000 mg | ORAL_TABLET | Freq: Three times a day (TID) | ORAL | Status: DC | PRN
  Administered 2024-08-11: 5 mg via ORAL
  Filled 2024-08-09: qty 1

## 2024-08-09 NOTE — Plan of Care (Signed)
   Problem: Education: Goal: Knowledge of [facility] Education information/materials will improve Outcome: Progressing Goal: Emotional status will improve Outcome: Progressing Goal: Mental status will improve Outcome: Progressing Goal: Verbalization of understanding the information provided will improve Outcome: Progressing

## 2024-08-09 NOTE — BH Assessment (Signed)
(  Sleep Hours) - 6.75 (Any PRNs that were needed, meds refused, or side effects to meds)- Benadryl , Haldol .  (Any disturbances and when (visitation, over night)- (Concerns raised by the patient)- In the AM Pt began to yell repeatedly. PRN agitation meds given PO (SI/HI/AVH)- Denies

## 2024-08-09 NOTE — Progress Notes (Signed)
   08/09/24 0840  Psych Admission Type (Psych Patients Only)  Admission Status Voluntary  Psychosocial Assessment  Patient Complaints Irritability  Eye Contact Fair  Facial Expression Flat  Affect Preoccupied  Speech Logical/coherent  Interaction Assertive  Motor Activity Other (Comment) (WNl for pt)  Appearance/Hygiene Disheveled  Behavior Characteristics Restless  Mood Preoccupied  Thought Process  Coherency WDL  Content WDL  Delusions None reported or observed  Perception Hallucinations  Hallucination Auditory;Visual  Judgment Limited  Confusion None  Danger to Self  Current suicidal ideation? Denies  Agreement Not to Harm Self Yes  Description of Agreement verbal  Danger to Others  Danger to Others None reported or observed

## 2024-08-09 NOTE — Plan of Care (Signed)
  Problem: Education: Goal: Knowledge of [place] [facility] Education information/materials will improve Outcome: Progressing Goal: Mental status will improve Outcome: Progressing   Problem: Activity: Goal: Interest or engagement in activities will improve Outcome: Progressing   

## 2024-08-09 NOTE — Progress Notes (Addendum)
 [facility] MD Progress Note  08/09/2024 1:25 PM [name] [name]  MRN:  [id number]  Principal Problem: Alcohol  withdrawal syndrome with perceptual disturbance (HCC) Diagnosis: Principal Problem:   Alcohol  withdrawal syndrome with perceptual disturbance (HCC) Active Problems:   Schizoaffective disorder, depressive type (HCC)   Alcohol  use disorder, severe, dependence (HCC)   GAD (generalized anxiety disorder)   Reason for Admission:  [name] [name] is [name] 48 y.o. 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 Patient initially arrived to [name] on 08/03/2024 for alcohol  withdrawal and hallucinations and passive SI, and admitted to [name] on 08/04/2024 for acute safety concerns, crisis stabalization, acute suicidal or self-harming behaviors, impaired functioning, substance related issues, severe substance-induced psychosis or mood disturbances, intensive therapeutic interventions, and stabilization of acute on chronic psychiatric conditions.  (admitted on 08/04/2024, total  LOS: 5 days )   last 24 hours:  VSS. Slept 6.75 hours. Mild agitation around 0600 due to his typical yelling at [facility]. Otherwise no major concerns from nursing. Taking scheduled medicines. No social work notes about residential referral. Not attending groups.    CIWA last 24:  1, 1, 1, 0  Information Obtained Today During Patient Interview:  Asking to have his gabapentin  increased.  Reports that it helps him with anxiety.  Denies having alcohol  cravings at this time.  Denies having any withdrawal symptoms including nausea, vomiting, diarrhea, diaphoresis, tremors.  Asked patient why he yells in the morning and he said that today he had [name] nightmare and then woke up and felt agitated and did not know what to do so he started yelling.  Reports  that he would prefer not to have the Haldol  and discussed switching him to olanzapine  for agitation protocol.  Reports he has been tolerating medications without side effects. Pt denies extrapyramidal symptoms including dystonia (sudden spastic contractions of muscle groups), parkinsonism (bradykinesia, tremors, rigidity), and akathisia (severe restlessness).  Reports that he has been eating okay.  Reports that he is sleeping well until the early morning when he has the nightmares but is falling asleep and staying asleep well.  Reports he is still interested in doing residential treatment.    Past Psychiatric Hx: Current Psychiatrist: none currently, was suppose to follow up with [name] [name] in [place] after hosp in July. Previously has been to [name] [name] in [place].  Current Therapist: none Previous Psychiatric Diagnoses: extensive diagnoses in the chart including numerous mood, psychotic, substance. Per pt schizoaffective disorder (type unknown).   Psychiatric Medications: Current Seroquel  per patient. Doesn't know dose Per pharm records, zyprexa  10 mg at bedtime Past Zyprexa  helps sleep but not AH Seroquel  helps sleep and AH  Psychiatric Hospitalization hx: numerous including several this year. Last hospitalization was in July at [facility].  ACT team hx: no history but interested Neuromodulation history: n/[name]  History of suicide: no attempts but extensive SI per chart History of homicide or aggression: denies   Substance Use Hx: Alcohol : 1/5/day over the last 7 days.  Reports history of alcohol  withdrawal seizures but none per chart review. Tobacco: vaping and cigarettes daily Cannabis: few hits [name] week Other Substances: substantial hx of meth, cocaine, opoid use. No use in the last several years.  Non-prescribed medications: n/[name] Rehab History: yes but not in the last several years   Past Medical History: PCP: none currently Medical Dx: none per patient  Meds: none per  patient Allergies: n/[name] Hospitalizations: none, numerous ED visit though Surgeries: n/[name] TBI: denies Seizures: denies   Family History: family history includes Alcohol  abuse in his father; Cancer in his maternal aunt. No primary psychotic disorder in family. Mother has anxiety.    Social History: Developmental: did not discuss Current Living Situation: Currently is in [name] [name].  Was staying with [name] [name] prior to admission.  Experiencing homelessness. Education: did not discuss Occupation: been on disability since early 69s, also on medicare Hobbies: exercise particularly walking Spirituality/religiosity: did not discuss Marital Status / Relationships: not in relationship currently Children: did not discuss Military: denies   Legal: denies any upcoming court dates.  Access to firearms: denies   Current Medications: Current Facility-Administered Medications  Medication Dose Route Frequency Provider Last Rate Last Admin   acetaminophen  (TYLENOL ) tablet 650 mg  650 mg Oral Q6H PRN [name] [name], PMHNP   650 mg at 08/07/24 1129   alum & mag hydroxide-simeth (MAALOX/MYLANTA) 200-200-20 MG/5ML suspension 30 mL  30 mL Oral Q4H PRN [name] [name], PMHNP       gabapentin  (NEURONTIN ) capsule 100 mg  100 mg Oral Once [name] Raimondo, MD       gabapentin  (NEURONTIN ) capsule 200 mg  200 mg Oral TID [name] Kersh, MD       hydrOXYzine  (ATARAX ) tablet 25 mg  25 mg Oral TID PRN Bobbitt, Shalon E, NP   25 mg at 08/09/24 1129   magnesium  hydroxide (MILK OF MAGNESIA) suspension 30 mL  30 mL Oral Daily PRN [name] [name], PMHNP       multivitamin with minerals tablet 1 tablet  1 tablet Oral Daily [name] [name], PMHNP   1 tablet at 08/09/24 0743   nicotine  (NICODERM CQ  - dosed in mg/24 hours) patch 14 mg  14 mg Transdermal Daily [name] Kitchens A, DO   14 mg at 08/09/24 0743   OLANZapine  (ZYPREXA ) injection 10 mg  10 mg Intramuscular TID PRN [name] Wigger, MD        OLANZapine  zydis (ZYPREXA ) disintegrating tablet 10 mg  10 mg Oral TID PRN [name] Darley, MD       OLANZapine  zydis (ZYPREXA ) disintegrating tablet 5 mg  5 mg Oral TID PRN Deshay Kirstein, MD       QUEtiapine  (SEROQUEL ) tablet 100 mg  100 mg Oral Daily Rebeckah Masih, MD   100 mg at 08/09/24 9256   QUEtiapine  (SEROQUEL ) tablet 100 mg  100 mg Oral Daily Hosteen Kienast, MD   100 mg at 08/08/24 1508   QUEtiapine  (SEROQUEL ) tablet 200 mg  200 mg Oral QHS Tamyka Bezio, MD   200 mg at 08/08/24 2027   thiamine  (Vitamin B-1) tablet 100 mg  100 mg Oral Daily [name] [name], PMHNP   100 mg at 08/09/24 0743    Lab Results: No results found for this or any previous visit (from the past 48 hours).  Blood Alcohol  level:  Lab Results  Component Value Date   ETH 87 (H) 08/03/2024   ETH <15 07/22/2024    Metabolic Labs: Lab Results  Component Value Date   HGBA1C 5.4 09/22/2021   MPG 108.28 09/22/2021   MPG 117 04/05/2021    Lab Results  Component Value Date   CHOL 170 10/10/2021   TRIG 86 10/10/2021   HDL 64 10/10/2021   CHOLHDL 2.7 10/10/2021   VLDL 17 10/10/2021   LDLCALC 89 10/10/2021   LDLCALC 66 09/22/2021    Physical Findings: AIMS:  No    Psychiatric Specialty Exam:  Presentation  General Appearance: Still somewhat disheveled but improved from admission, somewhat poor hygiene, hair is [name] mess  Eye Contact:Fair  Speech:Normal Rate  Speech Volume:Normal  Mood and Affect  Mood: Euthymic Affect: Flat  Thought Process  Thought Processes:Coherent; Linear  Descriptions of Associations:Intact  Orientation:Full (Time, Place and Person)  Thought Content: Logical, does not describe delusions or paranoia.  Future oriented  History of Schizophrenia/Schizoaffective disorder:Yes  Duration of Psychotic Symptoms:Greater than six months  Hallucinations: per nursing is RTIS and yelling. Pt denies  Ideas of Reference:None  Suicidal Thoughts:  Denies  Homicidal Thoughts: denies   Sensorium  Memory:Immediate Good; Recent Good; Remote Good  Judgment: Fair Insight: Fair, sometimes poor with regard to his agitation and yelling  Executive Functions  Concentration:Good  Attention Span:Good  Recall:Good  Fund of Knowledge:Good  Language:Good   Psychomotor Activity  Psychomotor Activity: normal   Assets  Assets:Desire for Improvement   Sleep  Sleep: Reports sleeping well but has nightmares which lead to him yelling    Physical Exam: Physical Exam Vitals and nursing note reviewed.  Constitutional:      General: He is not in acute distress.    Appearance: He is not ill-appearing.  HENT:     Head: Normocephalic and atraumatic.  Pulmonary:     Effort: Pulmonary effort is normal.  Neurological:     General: No focal deficit present.     Mental Status: He is alert.  Psychiatric:     Comments: No obvious EPS.    Review of Systems  Constitutional:  Negative for diaphoresis and fever.  Cardiovascular:  Negative for chest pain and palpitations.  Gastrointestinal:  Negative for constipation, diarrhea, nausea and vomiting.  Neurological:  Negative for dizziness, tremors, weakness and headaches.   Blood pressure 123/81, pulse 68, temperature 98.2 F (36.8 C), temperature source Oral, resp. rate 18, height 5' 9 (1.753 m), weight 72.6 kg, SpO2 98%. Body mass index is 23.63 kg/m.   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    08/09/24 Minimal CIWA's last 24 hours and now several days and so we will discontinue CIWA's.  Patient requesting increase gabapentin  so we will increase to 200 mg.  Reports it is helpful for his anxiety.  Still interested in doing residential rehab.  Given increasing gabapentin  will not increase Seroquel  this time but could consider going up as daytime doses given his lack of side effects and continued yelling episodes.  he appears to be at his psychiatric baseline.  Does not like Haldol  so we will switch his agitation protocol to olanzapine .    PLAN: Safety and Monitoring:             -- Voluntary admission to inpatient psychiatric unit for safety, stabilization and treatment             -- Daily contact with patient to assess and evaluate symptoms and progress in treatment             --  Patient's case to be discussed in multi-disciplinary team meeting             -- Observation Level : q15 minute checks             -- Vital signs:  q12 hours             -- Precautions: suicide, elopement, and assault   2. Psychiatric Treatment:  -- Continue Seroquel  100 mg once in the morning, 100 mg once in the afternoon, 200 mg once at bedtime for schizoaffective disorder -- Increase gabapentin  to 200 mg 3 times daily for GAD and AUD               -- Continue trazodone  50 mg once nightly as needed for insomnia             -- Continue hydroxyzine  25 mg 3 times daily as needed for anxiety   --  The risks/benefits/side-effects/alternatives to this medication were discussed in detail with the patient and time was given for questions. The patient consents to medication trial.              -- Metabolic profile and EKG monitoring obtained while on an atypical antipsychotic. See #4 below for values.              -- Encouraged patient to participate in unit milieu and in scheduled group therapies               -- Short Term Goals: Ability to identify changes in lifestyle to reduce recurrence of condition will improve, Ability to verbalize feelings will improve, Ability to disclose and discuss suicidal ideas, Ability to demonstrate self-control will improve, Ability to identify and develop effective coping behaviors will improve, Ability to maintain clinical measurements within normal limits will improve, Compliance with prescribed medications will improve, and Ability to identify triggers associated with substance abuse/mental health issues will improve             -- Long Term Goals: Improvement in symptoms so as ready for discharge                3. Medical Issues Being Addressed:              -- Start CIWA with Ativan  as needed for alcohol  drawl syndrome; will escalate this dose Ativan  if indicated               -- Continue PRN's: Tylenol , Maalox, Milk of Magnesia     4. Routine and other pertinent labs reviewed: EKG monitoring: QTc: 420  Metabolism / endocrine: BMI: Body mass index is 23.63 kg/m.  Lipid Panel: Lab Results  Component Value Date   CHOL 170 10/10/2021   TRIG 86 10/10/2021   HDL 64 10/10/2021   CHOLHDL 2.7 10/10/2021   VLDL 17 10/10/2021   LDLCALC 89 10/10/2021   LDLCALC 66 09/22/2021   HbgA1c: Hgb A1c MFr Bld (%)  Date Value  09/22/2021 5.4   TSH: TSH (uIU/mL)  Date Value  09/22/2021 2.873  01/27/2013 0.344 (L)    Labs to order: none  5. Discharge Planning:              -- Social work and case management to assist with discharge planning and identification of [name] follow-up needs prior to discharge             -- Estimated LOS: 10/23, pending acceptance to [facility]             --  Discharge Concerns: Need to establish [name] safety plan; Medication compliance and effectiveness             -- Discharge Goals: Return home with outpatient referrals for mental health follow-up including medication management/psychotherapy    I certify that inpatient services  furnished can reasonably be expected to improve the patient's condition.     Justino Cornish, MD PGY-2 Psychiatry Resident 08/09/2024, 1:25 PM

## 2024-08-09 NOTE — Progress Notes (Signed)
 Pt c/o agitation and anxiety, requesting medication, pt refused Hydroxyzine . Pt given Zyprexa , pt took meds willingly, pt denies any other needs at this time.

## 2024-08-10 ENCOUNTER — Encounter (HOSPITAL_COMMUNITY): Payer: Self-pay

## 2024-08-10 MED ORDER — GABAPENTIN 300 MG PO CAPS
300.0000 mg | ORAL_CAPSULE | Freq: Three times a day (TID) | ORAL | Status: DC
  Administered 2024-08-10 – 2024-08-11 (×3): 300 mg via ORAL
  Filled 2024-08-10: qty 21
  Filled 2024-08-10 (×3): qty 1

## 2024-08-10 MED ORDER — GABAPENTIN 300 MG PO CAPS: 300.0000 mg | ORAL_CAPSULE | Freq: Three times a day (TID) | ORAL | Status: DC

## 2024-08-10 NOTE — Progress Notes (Cosign Needed Addendum)
 [facility] MD Progress Note  08/10/2024 10:11 AM [name] [name]  MRN:  [id number]  Principal Problem: Alcohol  withdrawal syndrome with perceptual disturbance (HCC) Diagnosis: Principal Problem:   Alcohol  withdrawal syndrome with perceptual disturbance (HCC) Active Problems:   Schizoaffective disorder, depressive type (HCC)   Alcohol  use disorder, severe, dependence (HCC)   GAD (generalized anxiety disorder)   Reason for Admission:  [name] [name] is a 48 y.o. 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 Patient initially arrived to [facility] on 08/03/2024 for alcohol  withdrawal and hallucinations and passive SI, and admitted to [facility] on 08/04/2024 for acute safety concerns, crisis stabalization, acute suicidal or self-harming behaviors, impaired functioning, substance related issues, severe substance-induced psychosis or mood disturbances, intensive therapeutic interventions, and stabilization of acute on chronic psychiatric conditions.  (admitted on 08/04/2024, total  LOS: 6 days )  Last 24 Hours:  BP 116/65 VSS. Slept 8.25 hours. Nursing staff reported that he received the mild agitation protocol earlier this morning. Otherwise no major concerns from nursing. Taking scheduled medicines.   Information Obtained Today During Patient Interview:   [name] [name] reports that his mood has improved since admission. He denies feeling down, depressed, or sad. Anxiety symptoms remain elevated, though he states his sleep has been stable, concentration is good, and energy level is adequate. He denies any suicidal thoughts, intent, or plan, as well as homicidal ideation or psychotic symptoms. Denies any side effects from his current psychiatric medications. He continues to experience anxiety and requested that his gabapentin  dose  be increased to 300 mg, stating that this dosage has been effective for him in the past. He denies current withdrawal symptoms or cravings. When asked about recent yelling behavior, he explained that it occurred due to anxiety and agitation, and he plans to communicate with staff verbally rather than through yelling in the future.  We discussed discharged planning and the critical importance of complete abstinence from alcohol  and other substances to support recovery. [name] [name] verbalized understanding that abstaining can improve emotional stability, cognitive function, sleep quality, and anxiety levels. He reports that he was declined from [facility] due to the facility requesting a letter from his attorney related to upcoming court dates. He expressed interest in participating in a substance abuse intensive outpatient program and in engaging with outpatient therapy services after discharge.    Past Psychiatric Hx: Current Psychiatrist: none currently, was suppose to follow up with [facility] in [place] after hosp in July. Previously has been to [facility] in [place].  Current Therapist: none Previous Psychiatric Diagnoses: extensive diagnoses in the chart including numerous mood, psychotic, substance. Per pt schizoaffective disorder (type unknown).   Psychiatric Medications: Current Seroquel  per patient. Doesn't know dose Per pharm records, zyprexa  10 mg at bedtime Past Zyprexa  helps sleep but not AH Seroquel  helps sleep and AH  Psychiatric Hospitalization hx: numerous including several this year. Last hospitalization was in July at [facility].  ACT team hx: no history but interested Neuromodulation history: n/a  History of suicide: no attempts but extensive SI per chart History of homicide or aggression: denies   Substance Use Hx: Alcohol : 1/5/day over the last 7 days.  Reports history of alcohol  withdrawal seizures but none per chart review. Tobacco: vaping and cigarettes daily Cannabis: few hits a  week Other Substances: substantial hx of meth, cocaine, opoid use. No use in the  last several years.  Non-prescribed medications: n/a Rehab History: yes but not in the last several years   Past Medical History: PCP: none currently Medical Dx: none per patient Meds: none per patient Allergies: n/a Hospitalizations: none, numerous ED visit though Surgeries: n/a TBI: denies Seizures: denies   Family History: family history includes Alcohol  abuse in his father; Cancer in his maternal aunt. No primary psychotic disorder in family. Mother has anxiety.    Social History: Developmental: did not discuss Current Living Situation: Currently is in [place].  Was staying with a friend prior to admission.  Experiencing homelessness. Education: did not discuss Occupation: been on disability since early 58s, also on medicare Hobbies: exercise particularly walking Spirituality/religiosity: did not discuss Marital Status / Relationships: not in relationship currently Children: did not discuss Military: denies   Legal: denies any upcoming court dates.  Access to firearms: denies   Current Medications: Current Facility-Administered Medications  Medication Dose Route Frequency Provider Last Rate Last Admin   acetaminophen  (TYLENOL ) tablet 650 mg  650 mg Oral Q6H PRN Motley-Mangrum, Jadeka A, PMHNP   650 mg at 08/07/24 1129   alum & mag hydroxide-simeth (MAALOX/MYLANTA) 200-200-20 MG/5ML suspension 30 mL  30 mL Oral Q4H PRN Motley-Mangrum, Jadeka A, PMHNP       gabapentin  (NEURONTIN ) capsule 200 mg  200 mg Oral TID McCarty, Artie, MD   200 mg at 08/10/24 0931   hydrOXYzine  (ATARAX ) tablet 25 mg  25 mg Oral TID PRN Bobbitt, Shalon E, NP   25 mg at 08/09/24 2023   magnesium  hydroxide (MILK OF MAGNESIA) suspension 30 mL  30 mL Oral Daily PRN Motley-Mangrum, Jadeka A, PMHNP       multivitamin with minerals tablet 1 tablet  1 tablet Oral Daily Motley-Mangrum, Jadeka A, PMHNP   1 tablet at 08/10/24  9078   nicotine  (NICODERM CQ  - dosed in mg/24 hours) patch 14 mg  14 mg Transdermal Daily Prentis Kitchens A, DO   14 mg at 08/10/24 9080   OLANZapine  (ZYPREXA ) injection 10 mg  10 mg Intramuscular TID PRN McCarty, Artie, MD   10 mg at 08/10/24 0710   OLANZapine  zydis (ZYPREXA ) disintegrating tablet 10 mg  10 mg Oral TID PRN McCarty, Artie, MD   10 mg at 08/09/24 1712   OLANZapine  zydis (ZYPREXA ) disintegrating tablet 5 mg  5 mg Oral TID PRN McCarty, Artie, MD       QUEtiapine  (SEROQUEL ) tablet 100 mg  100 mg Oral Daily McCarty, Artie, MD   100 mg at 08/10/24 9068   QUEtiapine  (SEROQUEL ) tablet 100 mg  100 mg Oral Daily McCarty, Artie, MD   100 mg at 08/10/24 9078   QUEtiapine  (SEROQUEL ) tablet 200 mg  200 mg Oral QHS McCarty, Artie, MD   200 mg at 08/09/24 2023   thiamine  (Vitamin B-1) tablet 100 mg  100 mg Oral Daily Motley-Mangrum, Jadeka A, PMHNP   100 mg at 08/10/24 9068    Lab Results: No results found for this or any previous visit (from the past 48 hours).  Blood Alcohol  level:  Lab Results  Component Value Date   ETH 87 (H) 08/03/2024   ETH <15 07/22/2024    Metabolic Labs: Lab Results  Component Value Date   HGBA1C 5.4 09/22/2021   MPG 108.28 09/22/2021   MPG 117 04/05/2021    Lab Results  Component Value Date   CHOL 170 10/10/2021   TRIG 86 10/10/2021   HDL 64 10/10/2021   CHOLHDL 2.7 10/10/2021  VLDL 17 10/10/2021   LDLCALC 89 10/10/2021   LDLCALC 66 09/22/2021    Physical Findings: AIMS: No    Psychiatric Specialty Exam:  Presentation  General Appearance: Casual.   Eye Contact:Fair  Speech:Normal Rate  Speech Volume:Normal  Mood and Affect  Mood: Euthymic Affect: Flat  Thought Process  Thought Processes:Coherent; Linear  Descriptions of Associations:Intact  Orientation:Full (Time, Place and Person)  Thought Content: Logical. Future oriented  History of Schizophrenia/Schizoaffective disorder:Yes  Duration of Psychotic Symptoms:Greater  than six months  Hallucinations: Denies  Ideas of Reference:None  Suicidal Thoughts: Denies  Homicidal Thoughts: denies   Sensorium  Memory:Immediate Good; Recent Good; Remote Good  Judgment: Fair Insight: Fair  Risk manager  Attention Span:Good  Recall:Good  Fund of Knowledge:Good  Language:Good   Psychomotor Activity  Psychomotor Activity: normal   Assets  Assets:Desire for Improvement   Sleep  Sleep: Reports sleeping well    Physical Exam: Physical Exam Vitals and nursing note reviewed.  Constitutional:      General: He is not in acute distress.    Appearance: He is not ill-appearing.  HENT:     Head: Normocephalic and atraumatic.  Pulmonary:     Effort: Pulmonary effort is normal.  Neurological:     General: No focal deficit present.     Mental Status: He is alert.  Psychiatric:     Comments: No obvious EPS.    Review of Systems  Constitutional:  Negative for diaphoresis and fever.  Cardiovascular:  Negative for chest pain and palpitations.  Gastrointestinal:  Negative for constipation, diarrhea, nausea and vomiting.  Neurological:  Negative for dizziness, tremors, weakness and headaches.   Blood pressure 116/65, pulse 78, temperature 98.2 F (36.8 C), temperature source Oral, resp. rate 18, height 5' 9 (1.753 m), weight 72.6 kg, SpO2 98%. Body mass index is 23.63 kg/m.   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    08/10/24 Patient was seen in his room, dressed in casual clothing, appearing calm and cooperative. Thought processes were linear, and there were no overt psychotic symptoms or evidence of internal preoccupation. He demonstrates improved mood stability and insight into his symptoms, though anxiety remains heightened. He appears motivated for continued treatment and recovery, expressing interest in outpatient therapy and substance abuse programming. The plan is to increase gabapentin  to 300 mg three times daily for anxiety management. Anticipated discharge October 21, pending continued stability and absence of new concerns. The patient will be discharged to the [facility] with appropriate resources and referrals for outpatient substance treatment.   PLAN: Safety and Monitoring:             -- Voluntary admission to inpatient psychiatric unit for safety, stabilization and treatment             -- Daily contact with patient to assess and evaluate symptoms and progress in treatment             -- Patient's case to  be discussed in multi-disciplinary team meeting             -- Observation Level : q15 minute checks             -- Vital signs:  q12 hours             -- Precautions: suicide, elopement, and assault   2. Psychiatric Treatment:  -- Continue Seroquel  100 mg once in the morning, 100 mg once in the afternoon, 200 mg once at bedtime for schizoaffective disorder -- Increase gabapentin  to 300 mg 3 times daily for GAD and AUD               -- Continue trazodone  50 mg once nightly as needed for insomnia             -- Continue hydroxyzine  25 mg 3 times daily as needed for anxiety   --  The risks/benefits/side-effects/alternatives to this medication were discussed in detail with the patient and time was given for questions. The patient consents to medication trial.              -- Metabolic profile and EKG  monitoring obtained while on an atypical antipsychotic. See #4 below for values.              -- Encouraged patient to participate in unit milieu and in scheduled group therapies              -- Short Term Goals: Ability to identify changes in lifestyle to reduce recurrence of condition will improve, Ability to verbalize feelings will improve, Ability to disclose and discuss suicidal ideas, Ability to demonstrate self-control will improve, Ability to identify and develop effective coping behaviors will improve, Ability to maintain clinical measurements within normal limits will improve, Compliance with prescribed medications will improve, and Ability to identify triggers associated with substance abuse/mental health issues will improve             -- Long Term Goals: Improvement in symptoms so as ready for discharge                3. Medical Issues Being Addressed:              -- Start CIWA with Ativan  as needed for alcohol  drawl syndrome; will escalate this dose Ativan  if indicated               -- Continue PRN's: Tylenol , Maalox, Milk of Magnesia     4. Routine and other pertinent labs reviewed: EKG monitoring: QTc: 420  Metabolism / endocrine: BMI: Body mass index is 23.63 kg/m.  Lipid Panel: Lab Results  Component Value Date   CHOL 170 10/10/2021   TRIG 86 10/10/2021   HDL 64 10/10/2021   CHOLHDL 2.7 10/10/2021   VLDL 17 10/10/2021   LDLCALC 89 10/10/2021   LDLCALC 66 09/22/2021   HbgA1c: Hgb A1c MFr Bld (%)  Date Value  09/22/2021 5.4   TSH: TSH (uIU/mL)  Date Value  09/22/2021 2.873  01/27/2013 0.344 (L)    Labs to order: none  5. Discharge Planning:              -- Social work and case management to assist with discharge planning and identification of hospital follow-up needs prior to discharge             -- Estimated LOS: 10/21, pending acceptance to [facility]             -- Discharge Concerns:  Need to establish a safety plan; Medication compliance and  effectiveness             -- Discharge Goals: Return home with outpatient referrals for mental health follow-up including medication management/psychotherapy    I certify that inpatient services furnished can reasonably be expected to improve the patient's condition.     Blair Chiquita Hint, NP  08/10/2024, 10:11 AM  Patient ID: [name] [name], male   DOB: 12-01-1975, 48 y.o.   MRN: [id number]

## 2024-08-10 NOTE — Plan of Care (Signed)
   Problem: Education: Goal: Knowledge of [facility] Education information/materials will improve Outcome: Progressing Goal: Emotional status will improve Outcome: Progressing Goal: Mental status will improve Outcome: Progressing Goal: Verbalization of understanding the information provided will improve Outcome: Progressing

## 2024-08-10 NOTE — BH IP Treatment Plan (Signed)
 Interdisciplinary Treatment and Diagnostic Plan Update  08/10/2024 Time of Session: 10:10AM [name] [name] MRN: [id number]  Principal Diagnosis: Alcohol  withdrawal syndrome with perceptual disturbance [facility])  Secondary Diagnoses: Principal Problem:   Alcohol  withdrawal syndrome with perceptual disturbance (HCC) Active Problems:   Schizoaffective disorder, depressive type (HCC)   Alcohol  use disorder, severe, dependence (HCC)   GAD (generalized anxiety disorder)   Current Medications:  Current Facility-Administered Medications  Medication Dose Route Frequency Provider Last Rate Last Admin   acetaminophen  (TYLENOL ) tablet 650 mg  650 mg Oral Q6H PRN Motley-Mangrum, Jadeka A, PMHNP   650 mg at 08/07/24 1129   alum & mag hydroxide-simeth (MAALOX/MYLANTA) 200-200-20 MG/5ML suspension 30 mL  30 mL Oral Q4H PRN Motley-Mangrum, Jadeka A, PMHNP       gabapentin  (NEURONTIN ) capsule 300 mg  300 mg Oral TID Bennett, Christal H, NP   300 mg at 08/10/24 1511   hydrOXYzine  (ATARAX ) tablet 25 mg  25 mg Oral TID PRN Bobbitt, Shalon E, NP   25 mg at 08/09/24 2023   magnesium  hydroxide (MILK OF MAGNESIA) suspension 30 mL  30 mL Oral Daily PRN Motley-Mangrum, Jadeka A, PMHNP       multivitamin with minerals tablet 1 tablet  1 tablet Oral Daily Motley-Mangrum, Jadeka A, PMHNP   1 tablet at 08/10/24 9078   nicotine  (NICODERM CQ  - dosed in mg/24 hours) patch 14 mg  14 mg Transdermal Daily Prentis Kitchens A, DO   14 mg at 08/10/24 0919   OLANZapine  (ZYPREXA ) injection 10 mg  10 mg Intramuscular TID PRN McCarty, Artie, MD   10 mg at 08/10/24 0710   OLANZapine  zydis (ZYPREXA ) disintegrating tablet 10 mg  10 mg Oral TID PRN McCarty, Artie, MD   10 mg at 08/09/24 1712   OLANZapine  zydis (ZYPREXA ) disintegrating tablet 5 mg  5 mg Oral TID PRN McCarty, Artie, MD       QUEtiapine  (SEROQUEL ) tablet 100 mg  100 mg Oral Daily McCarty, Artie, MD   100 mg at 08/10/24 9068   QUEtiapine  (SEROQUEL ) tablet 100 mg  100 mg Oral  Daily McCarty, Artie, MD   100 mg at 08/10/24 1511   QUEtiapine  (SEROQUEL ) tablet 200 mg  200 mg Oral QHS McCarty, Artie, MD   200 mg at 08/09/24 2023   thiamine  (Vitamin B-1) tablet 100 mg  100 mg Oral Daily Motley-Mangrum, Jadeka A, PMHNP   100 mg at 08/10/24 0931   PTA Medications: Medications Prior to Admission  Medication Sig Dispense Refill Last Dose/Taking   QUEtiapine  (SEROQUEL ) 100 MG tablet Take 1 tablet (100 mg total) by mouth daily. 30 tablet 0 08/03/2024   QUEtiapine  (SEROQUEL ) 200 MG tablet Take 1 tablet (200 mg total) by mouth at bedtime. 30 tablet 0 08/03/2024    Patient Stressors: Financial difficulties   Medication change or noncompliance   Substance abuse    Patient Strengths: Motivation for treatment/growth   Treatment Modalities: Medication Management, Group therapy, Case management,  1 to 1 session with clinician, Psychoeducation, Recreational therapy.   Physician Treatment Plan for Primary Diagnosis: Alcohol  withdrawal syndrome with perceptual disturbance (HCC) Long Term Goal(s):     Short Term Goals:    Medication Management: Evaluate patient's response, side effects, and tolerance of medication regimen.  Therapeutic Interventions: 1 to 1 sessions, Unit Group sessions and Medication administration.  Evaluation of Outcomes: Progressing  Physician Treatment Plan for Secondary Diagnosis: Principal Problem:   Alcohol  withdrawal syndrome with perceptual disturbance (HCC) Active Problems:   Schizoaffective disorder,  depressive type (HCC)   Alcohol  use disorder, severe, dependence (HCC)   GAD (generalized anxiety disorder)  Long Term Goal(s):     Short Term Goals:       Medication Management: Evaluate patient's response, side effects, and tolerance of medication regimen.  Therapeutic Interventions: 1 to 1 sessions, Unit Group sessions and Medication administration.  Evaluation of Outcomes: Progressing   RN Treatment Plan for Primary Diagnosis:  Alcohol  withdrawal syndrome with perceptual disturbance (HCC) Long Term Goal(s): Knowledge of disease and therapeutic regimen to maintain health will improve  Short Term Goals: Ability to remain free from injury will improve, Ability to verbalize frustration and anger appropriately will improve, Ability to demonstrate self-control, Ability to participate in decision making will improve, and Ability to verbalize feelings will improve  Medication Management: RN will administer medications as ordered by provider, will assess and evaluate patient's response and provide education to patient for prescribed medication. RN will report any adverse and/or side effects to prescribing provider.  Therapeutic Interventions: 1 on 1 counseling sessions, Psychoeducation, Medication administration, Evaluate responses to treatment, Monitor vital signs and CBGs as ordered, Perform/monitor CIWA, COWS, AIMS and Fall Risk screenings as ordered, Perform wound care treatments as ordered.  Evaluation of Outcomes: Progressing   LCSW Treatment Plan for Primary Diagnosis: Alcohol  withdrawal syndrome with perceptual disturbance (HCC) Long Term Goal(s): Safe transition to appropriate next level of care at discharge, Engage patient in therapeutic group addressing interpersonal concerns.  Short Term Goals: Engage patient in aftercare planning with referrals and resources, Increase social support, Increase ability to appropriately verbalize feelings, Increase emotional regulation, and Facilitate acceptance of mental health diagnosis and concerns  Therapeutic Interventions: Assess for all discharge needs, 1 to 1 time with Social worker, Explore available resources and support systems, Assess for adequacy in community support network, Educate family and significant other(s) on suicide prevention, Complete Psychosocial Assessment, Interpersonal group therapy.  Evaluation of Outcomes: Progressing   Progress in Treatment: Attending  groups: No. Participating in groups: No. Taking medication as prescribed: Yes. Toleration medication: Yes. Family/Significant other contact made: No, will contact:   Darin Rosaline Stain [phone] Patient understands diagnosis: Yes. Discussing patient identified problems/goals with staff: Yes. Medical problems stabilized or resolved: Yes. Denies suicidal/homicidal ideation: Yes. Issues/concerns per patient self-inventory: No.     New problem(s) identified: No, Describe:  none   New Short Term/Long Term Goal(s): detox, medication management for mood stabilization; elimination of SI thoughts; development of comprehensive mental wellness/sobriety plan     Patient Goals:  Get into substance use treatment and find meds for the voices   Discharge Plan or Barriers: Patient recently admitted. CSW will continue to follow and assess for appropriate referrals and possible discharge planning.      Reason for Continuation of Hospitalization: Depression Medication stabilization Suicidal ideation Withdrawal symptoms   Estimated Length of Stay: 5-7 days  Last 3 [place] Suicide Severity Risk Score: Flowsheet Row [facility]24 [facility] Ad[facility] (Discharged[facility]  C-SSRS RISK CATEGORY High Risk High Risk Moderate Risk    Last PHQ 2/9 Scores:    10/05/2021    2:29 AM 09/21/2021   12:04 PM 07/01/2017    8:05 AM  Depression screen PHQ 2/9  Decreased Interest 2 2 3   Down, Depressed, Hopeless 2 3 3   PHQ - 2 Score 4 5 6   Altered sleeping  0 3 2  Tired, decreased energy 0 2 2  Change in appetite 0 3 3  Feeling bad or failure about yourself  2 2 2   Trouble concentrating 0 2 2  Moving slowly or fidgety/restless 1 2 2   Suicidal thoughts 2 3   PHQ-9 Score 9 22 19   Difficult doing  work/chores Extremely dIfficult  Not difficult at all    Scribe for Treatment Team: Diamante Truszkowski M Selso Mannor, ISRAEL 08/10/2024 3:14 PM

## 2024-08-10 NOTE — Progress Notes (Signed)
 [name] [name]   Type of Note: [facility]/Dispo planning  Pt states he was denied from [facility] due to upcoming court date on 10/31. Pt would like to be discharge tomorrow to the [facility] with information on where he can receive outpatient substance use treatment.   Discharge planned for tomorrow.   Signed:  Cheney Gosch, LCSW-A 08/10/2024  2:51 PM

## 2024-08-10 NOTE — Care Management Important Message (Signed)
 Patient informed of right to appeal discharge, provided phone number to [facility]. Patient expressed no interest in appealing discharge at this time. CSW will continue to monitor situation.

## 2024-08-10 NOTE — Progress Notes (Signed)
 Pt has been yelling in his room most of the morning. He was just banging on the wall in his room yelling. When confronted about the behavior he denied hearing voices but did say he was expressing his anger and frustrated about still being here but did say that he would try to keep it down and in his room.

## 2024-08-10 NOTE — Group Note (Signed)
 LCSW Group Therapy Note   Group Date: 08/10/2024 Start Time: 1300 End Time: 1350   Type of Therapy and Topic:  Group Therapy: Building Healthy Relationships & Boundaries  Participation Level:  Did Not Attend  Description of Group: This group will address the use of boundaries in their personal lives. Patients will explore why boundaries are important, the difference between healthy and unhealthy boundaries, and negative and postive outcomes of different boundaries and will look at how boundaries can be crossed.  Patients will be encouraged to identify current boundaries in their own lives and identify what kind of boundary is being set. Facilitators will guide patients in utilizing problem-solving interventions to address and correct types boundaries being used and to address when no boundary is being used. Understanding and applying boundaries will be explored and addressed for obtaining and maintaining a balanced life. Patients will be encouraged to explore ways to assertively make their boundaries and needs known to significant others in their lives, using other group members and facilitator for role play, support, and feedback. Participants discussed loneliness, healthy connections, and setting boundaries. They explored safe ways to meet people and shared personal experiences. Key insights were reinforced through discussion and quotes.  Therapeutic Goals:  1.  Patient will identify areas in their life where setting clear boundaries could be  used to improve their life.  2.  Patient will identify signs/triggers that a boundary is not being respected. 3.  Patient will identify two ways to set boundaries in order to achieve balance in  their lives: 4.  Patient will demonstrate ability to communicate their needs and set boundaries  through discussion and/or role plays  Summary of Patient Progress:  Patient did not attend.  Therapeutic Modalities:   Cognitive Behavioral  Therapy Solution-Focused Therapy  Louetta Lame, LCSWA 08/10/2024  4:05 PM

## 2024-08-10 NOTE — Plan of Care (Signed)
   Problem: Education: Goal: Emotional status will improve Outcome: Progressing Goal: Mental status will improve Outcome: Progressing Goal: Verbalization of understanding the information provided will improve Outcome: Progressing

## 2024-08-10 NOTE — Group Note (Signed)
 Date:  08/10/2024 Time:  9:19 AM  Group Topic/Focus:  Goals Group:   The focus of this group is to help patients establish daily goals to achieve during treatment and discuss how the patient can incorporate goal setting into their daily lives to aide in recovery.    Participation Level:  Minimal  Participation Quality:  Inattentive  Affect:  Labile  Cognitive:  Hallucinating  Insight: Limited  Engagement in Group:  Lacking and Poor  Modes of Intervention:  Discussion  Additional Comments:  Patient was observed to be responding to internal stimuli during group. Stated that his goal for today was to complete a phone interview with [facility].  [name] [name] 08/10/2024, 9:19 AM

## 2024-08-10 NOTE — Progress Notes (Signed)
(  Sleep Hours) - 8.25 (Any PRNs that were needed, meds refused, or side effects to meds)- PRN vistaril  25 mg given, no meds refused.  (Any disturbances and when (visitation, over night)- None  (Concerns raised by the patient)- None  (SI/HI/AVH)- Denies SI/HI. Continues to endorse AVH.

## 2024-08-10 NOTE — Progress Notes (Signed)
 [name] [name]   Type of Note: [facility]  Called and spoke with Jan in admissions [phone], pt's referral has been received and half of the phone screen has been completed. Jan requesting pt call back this morning around 10AM to finish phone screen.  Will continue to assist.   Signed:  Emeric Novinger, LCSW-A 08/10/2024  8:38 AM

## 2024-08-10 NOTE — Progress Notes (Signed)
   08/09/24 2023  Psych Admission Type (Psych Patients Only)  Admission Status Voluntary  Psychosocial Assessment  Patient Complaints None  Eye Contact Fair  Facial Expression Flat  Affect Preoccupied  Speech Logical/coherent  Interaction Assertive  Motor Activity Other (Comment) (WDL)  Appearance/Hygiene Improved  Behavior Characteristics Cooperative;Restless  Mood Preoccupied  Thought Process  Coherency WDL  Content WDL  Delusions None reported or observed  Perception Hallucinations  Hallucination Auditory;Visual  Judgment Limited  Confusion None  Danger to Self  Current suicidal ideation? Denies  Agreement Not to Harm Self Yes  Description of Agreement Verbal  Danger to Others  Danger to Others None reported or observed

## 2024-08-10 NOTE — Group Note (Signed)
 Recreation Therapy Group Note   Group Topic:General Recreation  Group Date: 08/10/2024 Start Time: 1010 End Time: 1030 Facilitators: Victory Dresden-McCall, LRT,CTRS Location: [address] Day[address]   Group Topic/Focus: General Recreation   Goal Area(s) Addresses:  Patient will use appropriate interactions with peers.   Patient will follow directions.  Behavioral Response: None  Intervention: Art and Movies  Activity: Patient with peers allowed  the opportunity to engage in art activities or watching a movie during recreation therapy group session today.    Affect/Mood: Flat   Participation Level: Minimal   Participation Quality: Independent   Behavior: On-looking   Speech/Thought Process: None   Insight: None   Judgement: None   Modes of Intervention: Socialization   Patient Response to Interventions:  Receptive   Education Outcome:  In group clarification offered    Clinical Observations/Individualized Feedback: Pt was in group for a while. Pt watched some of the movie. Pt didn't engage with peers. Pt was pretty much to himself. Pt left and didn't return.      Plan: Continue to engage patient in RT group sessions 2-3x/week.   Aamna Mallozzi-McCall, LRT,CTRS 08/10/2024 1:02 PM

## 2024-08-11 ENCOUNTER — Other Ambulatory Visit (HOSPITAL_COMMUNITY): Payer: Self-pay

## 2024-08-11 DIAGNOSIS — F411 Generalized anxiety disorder: Secondary | ICD-10-CM

## 2024-08-11 MED ORDER — HYDROXYZINE HCL 25 MG PO TABS
25.0000 mg | ORAL_TABLET | Freq: Three times a day (TID) | ORAL | 0 refills | Status: DC | PRN
  Filled 2024-08-11: qty 30, 10d supply, fill #0

## 2024-08-11 MED ORDER — QUETIAPINE FUMARATE 200 MG PO TABS
200.0000 mg | ORAL_TABLET | Freq: Every day | ORAL | 0 refills | Status: DC
  Filled 2024-08-11: qty 30, 30d supply, fill #0

## 2024-08-11 MED ORDER — NICOTINE 14 MG/24HR TD PT24: 14.0000 mg | MEDICATED_PATCH | Freq: Every day | TRANSDERMAL | Status: DC

## 2024-08-11 MED ORDER — QUETIAPINE FUMARATE 100 MG PO TABS
100.0000 mg | ORAL_TABLET | Freq: Two times a day (BID) | ORAL | 0 refills | Status: DC
  Filled 2024-08-11: qty 60, 30d supply, fill #0

## 2024-08-11 MED ORDER — QUETIAPINE FUMARATE 100 MG PO TABS: 100.0000 mg | ORAL_TABLET | Freq: Two times a day (BID) | ORAL | Status: DC

## 2024-08-11 MED ORDER — ADULT MULTIVITAMIN W/MINERALS CH: 1.0000 | ORAL_TABLET | Freq: Every day | ORAL | Status: DC

## 2024-08-11 MED ORDER — GABAPENTIN 300 MG PO CAPS
300.0000 mg | ORAL_CAPSULE | Freq: Three times a day (TID) | ORAL | 0 refills | Status: DC
  Filled 2024-08-11: qty 90, 30d supply, fill #0

## 2024-08-11 MED ORDER — VITAMIN B-1 100 MG PO TABS: 100.0000 mg | ORAL_TABLET | Freq: Every day | ORAL | Status: DC

## 2024-08-11 NOTE — [facility] Suicide Risk Assessment (Addendum)
 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 Patient initially arrived to [facility] on 08/03/2024 for alcohol  withdrawal and hallucinations and passive SI, and admitted to [facility] on 08/04/2024 for acute safety concerns, crisis stabalization, acute suicidal or self-harming behaviors, impaired functioning, substance related issues, severe substance-induced psychosis or mood disturbances, intensive therapeutic interventions, and stabilization of acute on chronic psychiatric conditions.   Total Time spent with patient: 30 minutes  Musculoskeletal: Strength & Muscle Tone: within normal limits Gait & Station: normal Patient leans: N/A  Psychiatric Specialty Exam  Presentation  General Appearance:  Casual  Eye Contact: Good  Speech: Clear and Coherent  Speech Volume: Normal  Handedness: Right   Mood and Affect  Mood: Euthymic  Duration of Depression Symptoms: No data recorded Affect: Appropriate; Full Range   Thought Process  Thought Processes: Coherent; Linear  Descriptions of Associations:Intact  Orientation:Full (Time, Place and Person)  Thought Content:WDL  History of Schizophrenia/Schizoaffective disorder:Yes  Duration of Psychotic Symptoms:Greater than six  months  Hallucinations:Hallucinations: None Description of Auditory Hallucinations: -- (Denies) Description of Visual Hallucinations: -- (Denies)  Ideas of Reference:None  Suicidal Thoughts:Suicidal Thoughts: No SI Passive Intent and/or Plan: -- (Denies)  Homicidal Thoughts:Homicidal Thoughts: No   Sensorium  Memory: Immediate Good; Recent Good; Remote Good  Judgment: Intact  Insight: Present   Executive Functions  Concentration: Good  Attention Span: Good  Recall: Good  Fund of Knowledge: Good  Language: Good   Psychomotor Activity  Psychomotor Activity: Psychomotor Activity: Normal   Assets  Assets: Desire for Improvement; Physical Health; Resilience   Sleep  Sleep: Sleep: Good  Estimated Sleeping Duration (Last 24 Hours): 5.75-6.50 hours  Physical Exam: Physical Exam ROS Blood pressure 115/76, pulse 81, temperature 97.6 F (36.4 C), temperature source Oral, resp. rate 18, height 5' 9 (1.753 m), weight 72.6 kg, SpO2 96%. Body mass index is 23.63 kg/m.  Mental Status Per Nursing Assessment::   On Admission:  Suicidal ideation indicated by patient, Self-harm thoughts  Demographic Factors:  Male, Caucasian, and Low socioeconomic status  Loss Factors: Legal issues and Financial problems/change in socioeconomic status  Historical Factors: Family history of mental illness or substance abuse and Impulsivity  Risk Reduction Factors:   Religious beliefs about death and Positive coping skills or problem solving skills  Continued Clinical Symptoms:  More than one psychiatric diagnosis Previous Psychiatric Diagnoses and Treatments  Cognitive Features That Contribute To Risk:  None    Suicide Risk:  Minimal: No identifiable suicidal ideation.  Patients presenting with no risk factors but with morbid ruminations; may be classified as minimal risk based on the severity of the depressive symptoms.  Patient denies suicidal ideation, intent or  plan at time of discharge.  No manic features.  No overt psychotic symptoms.  No withdrawal symptoms.  Patient is future oriented and motivated toward recovery.  Expresses interest in SA-IOP.  Stable for lower level of care.  Follow-up Information     [facility]. Go on 08/17/2024.   Why: You have an appointment on 08/17/24 at 8:30 am with this provider for an assessment, to obtain group therapy and medication management services.  They also offer substance abuse intensive outpatient therapy services (SAIOP). Contact information: [address] [place] [address] [phone]                 Plan Of Care/Follow-up recommendations:  See discharge summary.  Blair Chiquita Hint, NP 08/11/2024, 9:14 AM

## 2024-08-11 NOTE — Progress Notes (Signed)
(  Sleep Hours) - 6.75 (Any PRNs that were needed, meds refused, or side effects to meds)- PRN vistaril  25 mg given, no meds refused.  (Any disturbances and when (visitation, over night)- None  (Concerns raised by the patient)- None  (SI/HI/AVH)- Denies SI/HI/AVH

## 2024-08-11 NOTE — Progress Notes (Signed)
 Upon discharging, Nurse went over AVS educating on medications and follow up appointments. Suicide safety plan was completed. Reviewed with nurse and copy given to patient and placed in chart. Belongings sheet was signed and items returned to patient. Patient denies SI/HI (with no plan) and AVH. Voices no concerns to staff prior discharging off unit. Was safely walked out ride.

## 2024-08-11 NOTE — Group Note (Signed)
 Date:  08/11/2024 Time:  11:31 AM  Group Topic/Focus:  Pet Therapy:   This group aims to reduce stress and anxiety, improve mood, and increase feelings of comfort using animals.   Participation Level:  Did Not Attend    [name] [name] Mars 08/11/2024, 11:31 AM

## 2024-08-11 NOTE — Transportation (Signed)
 08/11/2024  [name] [name] DOB: 27-Nov-1975 MRN: [id number]   RIDER WAIVER AND RELEASE OF LIABILITY  For the purposes of helping with transportation needs, [facility] partners with outside transportation providers (taxi companies, [place], Catering manager.) to give [facility] patients or other approved people the choice of on-demand rides Public librarian) to our buildings for non-emergency visits.  By using [employer], I, the person signing this document, on behalf of myself and/or any legal minors (in my care using the [employer]), agree:  Science writer given to me are supplied by independent, outside transportation providers who do not work for, or have any affiliation with, [employer]. [facility] is not a transportation company. [facility] has no control over the quality or safety of the rides I get using [employer]. [place] has no control over whether any outside ride will happen on time or not. [facility] gives no guarantee on the reliability, quality, safety, or availability on any rides, or that no mistakes will happen. I know and accept that traveling by vehicle (car, truck, SVU, fleeta, bus, taxi, etc.) has risks of serious injuries such as disability, being paralyzed, and death. I know and agree the risk of using [employer] is mine alone, and not [employer]. [employer] are provided as is and as are available. The transportation providers are in charge for all inspections and care of the vehicles used to provide these rides. I agree not to take legal action against [place], its agents, employees, officers, directors, representatives, insurers, attorneys, assigns, successors, subsidiaries, and affiliates at any time for any reasons related directly or indirectly to using [employer]. I also agree not to take legal action against [facility] or its affiliates for any injury, death, or damage to property caused by or related to using  [employer]. I have read this Waiver and Release of Liability, and I understand the terms used in it and their legal meaning. This Waiver is freely and voluntarily given with the understanding that my right (or any legal minors) to legal action against  relating to [employer] is knowingly given up to use these services.   I attest that I read the Ride Waiver and Release of Liability to [name] [name], gave Mr. Bromwell the opportunity to ask questions and answered the questions asked (if any). I affirm that [name] [name] then provided consent for assistance with transportation.

## 2024-08-11 NOTE — Progress Notes (Signed)
   08/10/24 2035  Psych Admission Type (Psych Patients Only)  Admission Status Voluntary  Psychosocial Assessment  Patient Complaints None  Eye Contact Fair  Facial Expression Flat  Affect Preoccupied  Speech Logical/coherent  Interaction Assertive  Motor Activity Other (Comment) (WDL)  Appearance/Hygiene Improved  Behavior Characteristics Cooperative  Mood Preoccupied  Thought Process  Coherency WDL  Content WDL  Delusions None reported or observed  Perception Hallucinations  Hallucination None reported or observed;Auditory (Pt denies)  Judgment Limited  Confusion None  Danger to Self  Current suicidal ideation? Denies  Danger to Others  Danger to Others None reported or observed

## 2024-08-11 NOTE — Plan of Care (Signed)
   Problem: Education: Goal: Emotional status will improve Outcome: Progressing Goal: Mental status will improve Outcome: Progressing Goal: Verbalization of understanding the information provided will improve Outcome: Progressing   Problem: Activity: Goal: Interest or engagement in activities will improve Outcome: Progressing

## 2024-08-11 NOTE — Care Management Important Message (Signed)
 Medicare IM printed and given to social work to give to the patient. ?

## 2024-08-11 NOTE — Progress Notes (Addendum)
  [facility] Adult Case Management Discharge Plan :  Will you be returning to the same living situation after discharge:  No. At discharge, do you have transportation home?: Yes,  CSW arranged [employer] taxi for 1030AM to the [facility] Do you have the ability to pay for your medications: No. Med samples requested from pharmacy prior to d/c  Release of information consent forms completed and in the chart;  Patient's signature needed at discharge.  Patient to Follow up at:  Follow-up Information     [facility]. Go on 08/17/2024.   Why: You have an appointment on 08/17/24 at 8:30 am with this provider for an assessment, to obtain group therapy and medication management services.  They also offer substance abuse intensive outpatient therapy services (SAIOP). Contact information: [address] [place] [address] [phone]                 Next level of care provider has access to [facility] Link:no  Safety Planning and Suicide Prevention discussed: Yes,  Sister, Rosaline Stain [phone]     Has patient been referred to the Quitline?: Patient refused referral for treatment  Patient has been referred for addiction treatment: Pt given SA IOP information as well as NA and AA meeting brochures for the [facility] area  Jenkins LULLA Primer, LCSWA 08/11/2024, 8:49 AM

## 2024-08-11 NOTE — Discharge Summary (Signed)
 Physician Discharge Summary Note  Patient:  [name] [name] is an 48 y.o., male MRN:  [id number] DOB:  04-03-1976 Patient phone:  [phone] (home)  Patient address:   [place] [address],  Total Time spent with patient: 30 minutes  Date of Admission:  08/04/2024 Date of Discharge: 08/11/24   Reason for Admission:  [name] [name] is a 48 y.o. 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 Patient initially arrived to [facility] on 08/03/2024 for alcohol  withdrawal and hallucinations and passive SI, and admitted to [facility] on 08/04/2024 for acute safety concerns, crisis stabalization, acute suicidal or self-harming behaviors, impaired functioning, substance related issues, severe substance-induced psychosis or mood disturbances, intensive therapeutic interventions, and stabilization of acute on chronic psychiatric conditions.   [name] [name] is planned for discharge. Unfortunately, he was declined from [facility] due  to pending court dates. He will discharge to the [facility] with appropriate resources and referrals for outpatient substance treatment. He is aware of follow-up plans and demonstrates insight into the importance of ongoing treatment and abstaining from alcohol  and psychoactive substances. [name] is future oriented and motivated toward recovery. No current safety concerns or acute psychiatric symptoms observed. No withdrawal symptoms or cravings reported.    Principal Problem: Alcohol  withdrawal syndrome with perceptual disturbance [name]) Discharge Diagnoses: Principal Problem:   Alcohol  withdrawal syndrome with perceptual disturbance (HCC) Active Problems:   Schizoaffective disorder, depressive type (HCC)   Alcohol  use disorder, severe, dependence (HCC)   GAD (generalized anxiety  disorder)   Past Psychiatric History:  Current Psychiatrist: none currently, was suppose to follow up with [facility] in [place] after hosp in July. Previously has been to [facility] in [place].  Current Therapist: none Previous Psychiatric Diagnoses: extensive diagnoses in the chart including numerous mood, psychotic, substance. Per pt schizoaffective disorder (type unknown).   Psychiatric Medications: Current Seroquel  per patient. Doesn't know dose Per pharm records, zyprexa  10 mg at bedtime Past Zyprexa  helps sleep but not AH Seroquel  helps sleep and AH  Psychiatric Hospitalization hx: numerous including several this year. Last hospitalization was in July at [facility].  ACT team hx: no history but interested Neuromodulation history: n/a  History of suicide: no attempts but extensive SI per chart History of homicide or aggression: denies  Past Medical History:  Past Medical History:  Diagnosis Date   Bipolar disorder (HCC)    Cellulitis    Depression    Drug abuse (HCC)    Hepatitis C     Past Surgical History:  Procedure Laterality Date   BACK SURGERY     ROTATOR CUFF REPAIR     SHOULDER SURGERY     Family History:  Family History  Problem Relation Age of Onset   Alcohol  abuse Father    Cancer Maternal Aunt    Family Psychiatric  History: See H&P Social History:  Social History   Substance and Sexual Activity  Alcohol  Use Yes     Social History   Substance and Sexual Activity  Drug Use Not Currently   Types: Marijuana, Benzodiazepines, Other-see comments, Methamphetamines, Cocaine    Social History   Socioeconomic History   Marital status: Divorced    Spouse name: Not on file   Number of children: 2   Years of education: Not on file   Highest education level: GED or equivalent  Occupational History  Not on file  Tobacco Use   Smoking status: Every Day    Current packs/day: 1.00    Average packs/day: 1 pack/day for 42.8 years (42.8 ttl pk-yrs)     Types: Cigarettes    Start date: 10/22/1981   Smokeless tobacco: Never  Vaping Use   Vaping status: Never Used  Substance and Sexual Activity   Alcohol  use: Yes   Drug use: Not Currently    Types: Marijuana, Benzodiazepines, Other-see comments, Methamphetamines, Cocaine   Sexual activity: Not Currently  Other Topics Concern   Not on file  Social History Narrative   Not on file   Social Drivers of Health   Financial Resource Strain: High Risk (04/10/2023)   Received from [facility]   Overall Financial Resource Strain (CARDIA)    Difficulty of Paying Living Expenses: Hard  Food Insecurity: Patient Declined (08/04/2024)   Hunger Vital Sign    Worried About Running Out of Food in the Last Year: Patient declined    Ran Out of Food in the Last Year: Patient declined  Transportation Needs: Patient Declined (08/04/2024)   PRAPARE - Administrator, Civil Service (Medical): Patient declined    Lack of Transportation (Non-Medical): Patient declined  Recent Concern: Transportation Needs - Unmet Transportation Needs (07/23/2024)   PRAPARE - Administrator, Civil Service (Medical): Yes    Lack of Transportation (Non-Medical): No  Physical Activity: Inactive (04/10/2023)   Received from [facility]   Exercise Vital Sign    On average, how many days per week do you engage in moderate to strenuous exercise (like a brisk walk)?: 0 days    On average, how many minutes do you engage in exercise at this level?: 0 min  Stress: Stress Concern Present (04/22/2024)   Received from [facility] Occupational [facility] - Occupational Stress Questionnaire    Feeling of Stress : Rather much  Social Connections: Unknown (07/23/2024)   Social Connection and Isolation Panel    Frequency of Communication with Friends and Family: Patient declined    Frequency of Social Gatherings with Friends and Family: Patient declined    Attends  Religious Services: Patient declined    Database administrator or Organizations: Patient declined    Attends Banker Meetings: Patient declined    Marital Status: Divorced    Hospital Course:  During the patient's hospitalization, patient had extensive initial psychiatric evaluation, and follow-up psychiatric evaluations every day.  Psychiatric diagnoses provided upon initial assessment:  Principal Problem:   Alcohol  withdrawal syndrome with perceptual disturbance (HCC) Active Problems:   Schizoaffective disorder, depressive type (HCC)   Alcohol  use disorder, severe, dependence (HCC)   GAD (generalized anxiety disorder)  On admission, the patient was started on Seroquel  100 mg in the morning, 100 mg in afternoon, and 200 mg at bedtime for schizoaffective disorder.  During hospitalization, the patient was maintained on the same Seroquel  regimen for management of schizoaffective disorder. Initiated and optimized Gabapentin  to 300 mg 3 times daily for treatment of generalized anxiety disorder and alcohol  use disorder.  Patient's care was discussed during the interdisciplinary team meeting every day during the hospitalization.  The patient denies having side effects to prescribed psychiatric medication.  Gradually, patient started adjusting to milieu. The patient was evaluated each day by a clinical provider to ascertain response to treatment. Improvement was noted by the patient's report of decreasing symptoms, improved sleep and appetite, affect, medication  tolerance, behavior, and participation in unit programming.  Patient was asked each day to complete a self inventory noting mood, mental status, pain, new symptoms, anxiety and concerns.    Symptoms were reported as significantly decreased or resolved completely by discharge.   On day of discharge, the patient reports that their mood is stable. The patient denied having suicidal thoughts for more than 48 hours prior to  discharge.  Patient denies having homicidal thoughts.  Patient denies having auditory hallucinations.  Patient denies any visual hallucinations or other symptoms of psychosis. The patient was motivated to continue taking medication with a goal of continued improvement in mental health.   The patient reports their target psychiatric symptoms of worsening psychosis, anxiety and agitation responded well to the psychiatric medications, and the patient reports overall benefit other psychiatric hospitalization. Supportive psychotherapy was provided to the patient. The patient also participated in regular group therapy while hospitalized. Coping skills, problem solving as well as relaxation therapies were also part of the unit programming.  Labs were reviewed with the patient, and abnormal results were discussed with the patient.  The patient is able to verbalize their individual safety plan to this provider.  # It is recommended to the patient to continue psychiatric medications as prescribed, after discharge from the hospital.    # It is recommended to the patient to follow up with your outpatient psychiatric provider and PCP.  # It was discussed with the patient, the impact of alcohol , drugs, tobacco have been there overall psychiatric and medical wellbeing, and total abstinence from substance use was recommended the patient.ed.  # Prescriptions provided or sent directly to preferred pharmacy at discharge. Patient agreeable to plan. Given opportunity to ask questions. Appears to feel comfortable with discharge.    # In the event of worsening symptoms, the patient is instructed to call the crisis hotline, 911 and or go to the nearest ED for appropriate evaluation and treatment of symptoms. To follow-up with primary care provider for other medical issues, concerns and or health care needs  # Patient was discharged to the [facility] with a plan to follow up as noted below.   Physical  Findings: AIMS:  , 0,  ,  ,  ,  ,   CIWA:  CIWA-Ar Total: 0 COWS:   N/A  Musculoskeletal: Strength & Muscle Tone: within normal limits Gait & Station: normal Patient leans: N/A   Psychiatric Specialty Exam:  Presentation  General Appearance:  Casual  Eye Contact: Good  Speech: Clear and Coherent  Speech Volume: Normal  Handedness:Right   Mood and Affect  Mood: Euthymic  Affect: Appropriate; Full Range   Thought Process  Thought Processes: Coherent; Linear  Descriptions of Associations:Intact  Orientation:Full (Time, Place and Person)  Thought Content:WDL  History of Schizophrenia/Schizoaffective disorder:Yes  Duration of Psychotic Symptoms:Greater than six months  Hallucinations:Hallucinations: None Description of Auditory Hallucinations: -- (Denies) Description of Visual Hallucinations: -- (Denies)  Ideas of Reference:None  Suicidal Thoughts:Suicidal Thoughts: No SI Passive Intent and/or Plan: -- (Denies)  Homicidal Thoughts:Homicidal Thoughts: No   Sensorium  Memory: Immediate Good; Recent Good; Remote Good  Judgment: Intact  Insight: Present   Executive Functions  Concentration: Good  Attention Span: Good  Recall: Good  Fund of Knowledge: Good  Language: Good   Psychomotor Activity  Psychomotor Activity:Psychomotor Activity: Normal   Assets  Assets: Desire for Improvement; Physical Health; Resilience   Sleep  Sleep:Sleep: Good  Estimated Sleeping Duration (Last 24 Hours): 5.75-6.50 hours  Physical Exam: Physical Exam Vitals and nursing note reviewed.  Constitutional:      General: He is not in acute distress.    Appearance: He is not ill-appearing.  HENT:     Mouth/Throat:     Pharynx: Oropharynx is clear.  Cardiovascular:     Rate and Rhythm: Normal rate.     Pulses: Normal pulses.  Pulmonary:     Effort: No respiratory distress.  Skin:    General: Skin is dry.  Neurological:     Mental  Status: He is alert and oriented to person, place, and time. Mental status is at baseline.  Psychiatric:        Mood and Affect: Mood normal.        Behavior: Behavior normal.        Thought Content: Thought content normal.        Judgment: Judgment normal.    Review of Systems  Psychiatric/Behavioral:  Negative for hallucinations and suicidal ideas.        Stable for lower level of care  All other systems reviewed and are negative.  Blood pressure 115/76, pulse 81, temperature 97.6 F (36.4 C), temperature source Oral, resp. rate 18, height 5' 9 (1.753 m), weight 72.6 kg, SpO2 96%. Body mass index is 23.63 kg/m.   Social History   Tobacco Use  Smoking Status Every Day   Current packs/day: 1.00   Average packs/day: 1 pack/day for 42.8 years (42.8 ttl pk-yrs)   Types: Cigarettes   Start date: 10/22/1981  Smokeless Tobacco Never   Tobacco Cessation:  A prescription for an FDA-approved tobacco cessation medication provided at discharge   Blood Alcohol  level:  Lab Results  Component Value Date   ETH 87 (H) 08/03/2024   ETH <15 07/22/2024    Metabolic Disorder Labs:  Lab Results  Component Value Date   HGBA1C 5.4 09/22/2021   MPG 108.28 09/22/2021   MPG 117 04/05/2021   No results found for: PROLACTIN Lab Results  Component Value Date   CHOL 170 10/10/2021   TRIG 86 10/10/2021   HDL 64 10/10/2021   CHOLHDL 2.7 10/10/2021   VLDL 17 10/10/2021   LDLCALC 89 10/10/2021   LDLCALC 66 09/22/2021    See Psychiatric Specialty Exam and Suicide Risk Assessment completed by Attending Physician prior to discharge.  Discharge destination:  Other:  [facility]  Is patient on multiple antipsychotic therapies at discharge:  No   Has Patient had three or more failed trials of antipsychotic monotherapy by history:  No  Recommended Plan for Multiple Antipsychotic Therapies: NA  Discharge Instructions     Activity as tolerated - No restrictions   Complete  by: As directed    Diet - low sodium heart healthy   Complete by: As directed       Allergies as of 08/11/2024       Reactions   Lithium Anaphylaxis   Penicillins Rash, Other (See Comments)   Has patient had a PCN reaction causing immediate rash, facial/tongue/throat swelling, SOB or lightheadedness with hypotension: Yes Has patient had a PCN reaction causing severe rash involving mucus membranes or skin necrosis: No Has patient had a PCN reaction that required hospitalization: No Has patient had a PCN reaction occurring within the last 10 years: No If all of the above answers are NO, then may proceed with Cephalosporin use.        Medication List     TAKE these medications      Indication  gabapentin  300 MG capsule Commonly known as: NEURONTIN  Take 1 capsule (300 mg total) by mouth 3 (three) times daily.  Indication: Abuse or Misuse of Alcohol , Generalized Anxiety Disorder   hydrOXYzine  25 MG tablet Commonly known as: ATARAX  Take 1 tablet (25 mg total) by mouth 3 (three) times daily as needed for anxiety.  Indication: Feeling Anxious   multivitamin with minerals Tabs tablet Take 1 tablet by mouth daily. Start taking on: August 12, 2024  Indication: Nutritional Support   nicotine  14 mg/24hr patch Commonly known as: NICODERM CQ  - dosed in mg/24 hours Place 1 patch (14 mg total) onto the skin daily. Start taking on: August 12, 2024  Indication: Nicotine  Addiction   QUEtiapine  200 MG tablet Commonly known as: SEROQUEL  Take 1 tablet (200 mg total) by mouth at bedtime. What changed: Another medication with the same name was changed. Make sure you understand how and when to take each.  Indication: Schizophrenia   QUEtiapine  100 MG tablet Commonly known as: SEROQUEL  Take 1 tablet (100mg ) by mouth 2 times daily at 8 AM and 3 PM What changed: when to take this  Indication: Schizophrenia   thiamine  100 MG tablet Commonly known as: Vitamin B-1 Take 1 tablet (100  mg total) by mouth daily. Start taking on: August 12, 2024  Indication: Alcohol  Use Disorder        Follow-up Information     [facility] Go on 08/17/2024.   Why: You have an appointment on 08/17/24 at 8:30 am with this provider for an assessment, to obtain group therapy and medication management services.  They also offer substance abuse intensive outpatient therapy services (SAIOP). Contact information: [address] [place] [address] [phone]                 Follow-up recommendations:   Activity: as tolerated  Diet: heart healthy  Other: -Follow-up with your outpatient psychiatric provider -instructions on appointment date, time, and address (location) are provided to you in discharge paperwork.  -Take your psychiatric medications as prescribed at discharge - instructions are provided to you in the discharge paperwork  -Follow-up with outpatient primary care doctor and other specialists -for management of preventative medicine and chronic medical disease.  -Testing: Follow-up with outpatient provider for abnormal lab results: Elevated ALT, AST.   -If you are prescribed an atypical antipsychotic medication, we recommend that your outpatient psychiatrist follow routine screening for side effects within 3 months of discharge, including monitoring: AIMS scale, height, weight, blood pressure, fasting lipid panel, HbA1c, and fasting blood sugar.   -Recommend total abstinence from alcohol , tobacco, and other illicit drug use at discharge.   -If your psychiatric symptoms recur, worsen, or if you have side effects to your psychiatric medications, call your outpatient psychiatric provider, 911, [phone] or go to the nearest emergency department.  -If suicidal thoughts occur, immediately call your outpatient psychiatric provider, 911, [phone] or go to the nearest emergency department.     Comments:  Follow all discharge instructions as recommended.    Signed: Blair Chiquita Hint, NP 08/11/2024, 9:13 AM

## 2024-08-13 ENCOUNTER — Other Ambulatory Visit (HOSPITAL_COMMUNITY): Payer: Self-pay

## 2024-08-21 ENCOUNTER — Other Ambulatory Visit (HOSPITAL_COMMUNITY): Payer: Self-pay

## 2024-10-31 ENCOUNTER — Encounter (HOSPITAL_COMMUNITY): Payer: Self-pay | Admitting: Behavioral Health

## 2024-10-31 ENCOUNTER — Other Ambulatory Visit: Payer: Self-pay

## 2024-10-31 ENCOUNTER — Inpatient Hospital Stay (HOSPITAL_COMMUNITY)
Admission: AD | Admit: 2024-10-31 | Discharge: 2024-11-05 | DRG: 885 | Disposition: A | Discharge status: Home / Self Care | Attending: Student in an Organized Health Care Education/Training Program | Admitting: Student in an Organized Health Care Education/Training Program

## 2024-10-31 ENCOUNTER — Emergency Department (HOSPITAL_COMMUNITY)
Admission: EM | Admit: 2024-10-31 | Discharge: 2024-10-31 | Disposition: A | Discharge status: Other Acute Inpatient Hospital | Attending: Emergency Medicine | Admitting: Emergency Medicine

## 2024-10-31 DIAGNOSIS — Z59868 Other specified financial insecurity: Secondary | ICD-10-CM | POA: Diagnosis not present

## 2024-10-31 DIAGNOSIS — F259 Schizoaffective disorder, unspecified: Secondary | ICD-10-CM

## 2024-10-31 DIAGNOSIS — Z560 Unemployment, unspecified: Secondary | ICD-10-CM

## 2024-10-31 DIAGNOSIS — F17210 Nicotine dependence, cigarettes, uncomplicated: Secondary | ICD-10-CM | POA: Diagnosis present

## 2024-10-31 DIAGNOSIS — F10239 Alcohol dependence with withdrawal, unspecified: Secondary | ICD-10-CM | POA: Diagnosis present

## 2024-10-31 DIAGNOSIS — F1090 Alcohol use, unspecified, uncomplicated: Secondary | ICD-10-CM

## 2024-10-31 DIAGNOSIS — Z79899 Other long term (current) drug therapy: Secondary | ICD-10-CM | POA: Diagnosis not present

## 2024-10-31 DIAGNOSIS — Z5982 Transportation insecurity: Secondary | ICD-10-CM | POA: Diagnosis not present

## 2024-10-31 DIAGNOSIS — F1020 Alcohol dependence, uncomplicated: Secondary | ICD-10-CM | POA: Diagnosis not present

## 2024-10-31 DIAGNOSIS — Z5900 Homelessness unspecified: Secondary | ICD-10-CM | POA: Diagnosis not present

## 2024-10-31 DIAGNOSIS — F251 Schizoaffective disorder, depressive type: Secondary | ICD-10-CM | POA: Diagnosis present

## 2024-10-31 DIAGNOSIS — Z5941 Food insecurity: Secondary | ICD-10-CM

## 2024-10-31 DIAGNOSIS — F1521 Other stimulant dependence, in remission: Secondary | ICD-10-CM | POA: Diagnosis present

## 2024-10-31 DIAGNOSIS — F1590 Other stimulant use, unspecified, uncomplicated: Secondary | ICD-10-CM | POA: Diagnosis present

## 2024-10-31 DIAGNOSIS — Z811 Family history of alcohol abuse and dependence: Secondary | ICD-10-CM | POA: Diagnosis not present

## 2024-10-31 DIAGNOSIS — R45851 Suicidal ideations: Secondary | ICD-10-CM | POA: Diagnosis present

## 2024-10-31 DIAGNOSIS — Z5948 Other specified lack of adequate food: Secondary | ICD-10-CM

## 2024-10-31 DIAGNOSIS — Z809 Family history of malignant neoplasm, unspecified: Secondary | ICD-10-CM | POA: Diagnosis not present

## 2024-10-31 DIAGNOSIS — F1010 Alcohol abuse, uncomplicated: Secondary | ICD-10-CM | POA: Insufficient documentation

## 2024-10-31 DIAGNOSIS — F419 Anxiety disorder, unspecified: Secondary | ICD-10-CM | POA: Diagnosis present

## 2024-10-31 DIAGNOSIS — F1410 Cocaine abuse, uncomplicated: Secondary | ICD-10-CM | POA: Diagnosis present

## 2024-10-31 LAB — CBC
HCT: 40.9 % (ref 39.0–52.0)
Hemoglobin: 14.3 g/dL (ref 13.0–17.0)
MCH: 31.2 pg (ref 26.0–34.0)
MCHC: 35 g/dL (ref 30.0–36.0)
MCV: 89.1 fL (ref 80.0–100.0)
Platelets: 329 K/uL (ref 150–400)
RBC: 4.59 MIL/uL (ref 4.22–5.81)
RDW: 12.5 % (ref 11.5–15.5)
WBC: 16 K/uL — ABNORMAL HIGH (ref 4.0–10.5)
nRBC: 0 % (ref 0.0–0.2)

## 2024-10-31 LAB — COMPREHENSIVE METABOLIC PANEL WITH GFR
ALT: 38 U/L (ref 0–44)
AST: 40 U/L (ref 15–41)
Albumin: 4.7 g/dL (ref 3.5–5.0)
Alkaline Phosphatase: 87 U/L (ref 38–126)
Anion gap: 17 — ABNORMAL HIGH (ref 5–15)
BUN: 11 mg/dL (ref 6–20)
CO2: 22 mmol/L (ref 22–32)
Calcium: 9.1 mg/dL (ref 8.9–10.3)
Chloride: 101 mmol/L (ref 98–111)
Creatinine, Ser: 0.88 mg/dL (ref 0.61–1.24)
GFR, Estimated: >60 mL/min
Glucose, Bld: 103 mg/dL — ABNORMAL HIGH (ref 70–99)
Potassium: 3.7 mmol/L (ref 3.5–5.1)
Sodium: 140 mmol/L (ref 135–145)
Total Bilirubin: 0.5 mg/dL (ref 0.0–1.2)
Total Protein: 7.4 g/dL (ref 6.5–8.1)

## 2024-10-31 LAB — URINE DRUG SCREEN
Amphetamines: NEGATIVE
Barbiturates: NEGATIVE
Benzodiazepines: NEGATIVE
Cocaine: NEGATIVE
Fentanyl: NEGATIVE
Methadone Scn, Ur: NEGATIVE
Opiates: NEGATIVE
Tetrahydrocannabinol: NEGATIVE

## 2024-10-31 LAB — MAGNESIUM: Magnesium: 1.9 mg/dL (ref 1.7–2.4)

## 2024-10-31 LAB — ETHANOL: Alcohol, Ethyl (B): <15 mg/dL

## 2024-10-31 MED ORDER — ADULT MULTIVITAMIN W/MINERALS CH
1.0000 | ORAL_TABLET | Freq: Every day | ORAL | Status: DC
  Administered 2024-11-01 – 2024-11-05 (×5): 1 via ORAL
  Filled 2024-10-31 (×5): qty 1

## 2024-10-31 MED ORDER — LORAZEPAM 1 MG PO TABS
1.0000 mg | ORAL_TABLET | Freq: Every day | ORAL | Status: AC
  Administered 2024-11-05: 1 mg via ORAL
  Filled 2024-10-31: qty 1

## 2024-10-31 MED ORDER — LORAZEPAM 1 MG PO TABS
1.0000 mg | ORAL_TABLET | Freq: Four times a day (QID) | ORAL | Status: AC | PRN
  Filled 2024-10-31: qty 1

## 2024-10-31 MED ORDER — LORAZEPAM 2 MG/ML IJ SOLN: 2.0000 mg | Freq: Three times a day (TID) | INTRAMUSCULAR | Status: DC | PRN

## 2024-10-31 MED ORDER — LORAZEPAM 1 MG PO TABS
1.0000 mg | ORAL_TABLET | ORAL | Status: DC | PRN
  Administered 2024-10-31: 1 mg via ORAL
  Filled 2024-10-31: qty 1

## 2024-10-31 MED ORDER — LORAZEPAM 1 MG PO TABS
1.0000 mg | ORAL_TABLET | Freq: Three times a day (TID) | ORAL | Status: AC
  Administered 2024-11-02 – 2024-11-03 (×3): 1 mg via ORAL
  Filled 2024-10-31 (×3): qty 1

## 2024-10-31 MED ORDER — QUETIAPINE FUMARATE 100 MG PO TABS: 100.0000 mg | ORAL_TABLET | Freq: Every day | ORAL | Status: DC

## 2024-10-31 MED ORDER — HALOPERIDOL 5 MG PO TABS
5.0000 mg | ORAL_TABLET | Freq: Three times a day (TID) | ORAL | Status: DC | PRN
  Administered 2024-11-02: 5 mg via ORAL
  Filled 2024-10-31: qty 1

## 2024-10-31 MED ORDER — LORAZEPAM 1 MG PO TABS
1.0000 mg | ORAL_TABLET | Freq: Four times a day (QID) | ORAL | Status: AC
  Administered 2024-10-31 – 2024-11-02 (×6): 1 mg via ORAL
  Filled 2024-10-31 (×5): qty 1

## 2024-10-31 MED ORDER — FOLIC ACID 1 MG PO TABS
1.0000 mg | ORAL_TABLET | Freq: Every day | ORAL | Status: DC
  Administered 2024-11-01 – 2024-11-05 (×5): 1 mg via ORAL
  Filled 2024-10-31 (×5): qty 1

## 2024-10-31 MED ORDER — DIPHENHYDRAMINE HCL 50 MG/ML IJ SOLN: 50.0000 mg | Freq: Three times a day (TID) | INTRAMUSCULAR | Status: DC | PRN

## 2024-10-31 MED ORDER — LORAZEPAM 1 MG PO TABS
1.0000 mg | ORAL_TABLET | Freq: Two times a day (BID) | ORAL | Status: AC
  Administered 2024-11-03 – 2024-11-04 (×2): 1 mg via ORAL
  Filled 2024-10-31 (×2): qty 1

## 2024-10-31 MED ORDER — THIAMINE MONONITRATE 100 MG PO TABS
100.0000 mg | ORAL_TABLET | Freq: Every day | ORAL | Status: DC
  Administered 2024-10-31: 100 mg via ORAL
  Filled 2024-10-31: qty 1

## 2024-10-31 MED ORDER — LORAZEPAM 2 MG/ML IJ SOLN: 1.0000 mg | INTRAMUSCULAR | Status: DC | PRN

## 2024-10-31 MED ORDER — DIPHENHYDRAMINE HCL 25 MG PO CAPS
50.0000 mg | ORAL_CAPSULE | Freq: Three times a day (TID) | ORAL | Status: DC | PRN
  Administered 2024-11-02: 50 mg via ORAL
  Filled 2024-10-31: qty 2

## 2024-10-31 MED ORDER — VITAMIN B-1 100 MG PO TABS
100.0000 mg | ORAL_TABLET | Freq: Every day | ORAL | Status: DC
  Administered 2024-11-01 – 2024-11-05 (×5): 100 mg via ORAL
  Filled 2024-10-31 (×5): qty 1

## 2024-10-31 MED ORDER — GABAPENTIN 100 MG PO CAPS: 100.0000 mg | ORAL_CAPSULE | Freq: Two times a day (BID) | ORAL | Status: DC

## 2024-10-31 MED ORDER — HYDROXYZINE HCL 25 MG PO TABS
25.0000 mg | ORAL_TABLET | Freq: Three times a day (TID) | ORAL | Status: DC | PRN
  Administered 2024-10-31 – 2024-11-05 (×5): 25 mg via ORAL
  Filled 2024-10-31 (×5): qty 1

## 2024-10-31 MED ORDER — GABAPENTIN 100 MG PO CAPS
100.0000 mg | ORAL_CAPSULE | Freq: Two times a day (BID) | ORAL | Status: DC
  Administered 2024-10-31 – 2024-11-05 (×10): 100 mg via ORAL
  Filled 2024-10-31 (×10): qty 1

## 2024-10-31 MED ORDER — LOPERAMIDE HCL 2 MG PO CAPS: 2.0000 mg | ORAL_CAPSULE | ORAL | Status: AC | PRN

## 2024-10-31 MED ORDER — QUETIAPINE FUMARATE 100 MG PO TABS
100.0000 mg | ORAL_TABLET | Freq: Every day | ORAL | Status: DC
  Administered 2024-10-31 – 2024-11-03 (×4): 100 mg via ORAL
  Filled 2024-10-31 (×4): qty 1

## 2024-10-31 MED ORDER — ONDANSETRON 4 MG PO TBDP
4.0000 mg | ORAL_TABLET | Freq: Four times a day (QID) | ORAL | Status: AC | PRN
  Administered 2024-11-02: 4 mg via ORAL
  Filled 2024-10-31: qty 1

## 2024-10-31 MED ORDER — ADULT MULTIVITAMIN W/MINERALS CH
1.0000 | ORAL_TABLET | Freq: Every day | ORAL | Status: DC
  Administered 2024-10-31: 1 via ORAL
  Filled 2024-10-31: qty 1

## 2024-10-31 MED ORDER — THIAMINE HCL 100 MG/ML IJ SOLN: 100.0000 mg | Freq: Every day | INTRAMUSCULAR | Status: DC

## 2024-10-31 MED ORDER — ALUM & MAG HYDROXIDE-SIMETH 200-200-20 MG/5ML PO SUSP: 30.0000 mL | ORAL | Status: DC | PRN

## 2024-10-31 MED ORDER — ACETAMINOPHEN 325 MG PO TABS
650.0000 mg | ORAL_TABLET | Freq: Four times a day (QID) | ORAL | Status: DC | PRN
  Administered 2024-11-02: 650 mg via ORAL
  Filled 2024-10-31: qty 2

## 2024-10-31 MED ORDER — HALOPERIDOL LACTATE 5 MG/ML IJ SOLN: 10.0000 mg | Freq: Three times a day (TID) | INTRAMUSCULAR | Status: DC | PRN

## 2024-10-31 MED ORDER — MAGNESIUM HYDROXIDE 400 MG/5ML PO SUSP: 30.0000 mL | Freq: Every day | ORAL | Status: DC | PRN

## 2024-10-31 MED ORDER — HALOPERIDOL LACTATE 5 MG/ML IJ SOLN: 5.0000 mg | Freq: Three times a day (TID) | INTRAMUSCULAR | Status: DC | PRN

## 2024-10-31 MED ORDER — FOLIC ACID 1 MG PO TABS
1.0000 mg | ORAL_TABLET | Freq: Every day | ORAL | Status: DC
  Administered 2024-10-31: 1 mg via ORAL
  Filled 2024-10-31: qty 1

## 2024-10-31 NOTE — Progress Notes (Signed)
 ICM consulted for substance abuse resources. Resources attached to AVS. No further ICM needs.

## 2024-10-31 NOTE — Tx Team (Signed)
 Initial Treatment Plan 10/31/2024 11:40 PM REYES ALDACO [id number]    PATIENT STRESSORS: {PATIENT STRESSORS:22669}   PATIENT STRENGTHS: {PATIENT STRENGTHS:22666}   PATIENT IDENTIFIED PROBLEMS:                      DISCHARGE CRITERIA:  {DISCHARGE CRITERIA:22667}  PRELIMINARY DISCHARGE PLAN: {Preliminary discharge plan:22668}  PATIENT/FAMILY INVOLVEMENT: This treatment plan has been presented to and reviewed with the patient, [name] [name], and/or family member, ***.  The patient and family have been given the opportunity to ask questions and make suggestions.  Slater MALVA Edin, RN 10/31/2024, 11:40 PM

## 2024-10-31 NOTE — ED Notes (Signed)
 Spoke with [facility], they are sending an technical sales engineer to transport the pt to [facility].

## 2024-10-31 NOTE — ED Notes (Signed)
 Patient given ham sandwich and shasta cola.

## 2024-10-31 NOTE — Consult Note (Signed)
 [facility] Psychiatric Consult Initial  Patient Name: .[name] [name]  MRN: [id number]  DOB: 1976-02-10  Consult Order details:  Orders (From admission, onward)     Start     Ordered   10/31/24 1526  CONSULT TO CALL ACT TEAM       Ordering Provider: Jerrol Agent, MD  Provider:  (Not yet assigned)  Question:  Reason for Consult?  Answer:  SI w/plan, command auditory hallucinations   10/31/24 1525             Mode of Visit: In person    Psychiatry Consult Evaluation  Service Date: October 31, 2024 LOS:  LOS: 0 days  Chief Complaint Alcohol  use, AH to kill myself and SI to jump off a bridge   Primary Psychiatric Diagnoses  Schizoaffective disorder 2.   SI 3.   Alcohol  use  Assessment  [name] [name] is a 49 y.o. male admitted: Presented to the ED on 10/31/2024  1:57 PM for alcohol  use, AH to kill myself and SI to jump off a bridge. He carries the psychiatric diagnoses of schizoaffective disorder, depressed type, alcohol  use disorder, polysubstance abuse and GAD and has a past medical history of alcohol  withdrawal seizure (self reported) and hepatitis C.   His current presentation of command auditory hallucinations, visual hallucinations, depressive symptoms is most consistent with schizoaffective disorder, depressive type. He meets criteria for inpatient psychiatric treatment based on active SI with a plan to jump off a bridge and command auditory hallucinations telling him to kill himself. Current outpatient psychotropic medications include Seroquel  200 mg QHS, 100 mg BID, and gabapentin  300 mg TID and historically he has had a positive response to these medications. He was none compliant with medications prior to admission as evidenced by reporting that he stopped his medication a couple months ago.  On initial examination, patient is lying down on the stretcher in no acute distress. He is alert and oriented x 4. Thought process is linear. Thought content is positive for active  SI with a plan, command auditory hallucinations, and visual hallucinations and negative for paranoia and delusional ideations. Objectively, no signs of acute psychosis and patient does not appear to be responding to internal or external stimuli on exam. His mood is depressed and affect is congruent. His speech is clear and coherent at a decreased tone. He has fair eye contact. He appears disheveled. He is calm and cooperative on exam.  Please see plan below for detailed recommendations.   Diagnoses:  Active Hospital problems: Principal Problem:   Suicidal ideation Active Problems:   Schizoaffective disorder (HCC)   Alcohol  use    Plan   ## Psychiatric Medication Recommendations:  Start Seroquel  100 mg po at bedtime for mood stabilization  Start gabapentin  100 mg BID for mood stabilization  Continue CIWA and Ativan  taper for alcohol  withdrawal   ## Medical Decision Making Capacity: Not specifically addressed in this encounter  ## Further Work-up:  -- TSH, A1C and lipid panel (add to [facility] order set if accepted) While pt on Qtc prolonging medications, please monitor & replete K+ to 4 and Mg2+ to 2 -- most recent EKG on 10/31/24 had QtC of 448 -- Pertinent labwork reviewed earlier this admission includes: BAL, UDS, CMP, CBC and EKG   ## Disposition:-- We recommend inpatient psychiatric hospitalization after medical hospitalization. Patient has been involuntarily committed on 10/31/24.   ## Behavioral / Environmental: - No specific recommendations at this time.     ## Safety  and Observation Level:  - Based on my clinical evaluation, I estimate the patient to be at High risk of self harm in the current setting. - At this time, we recommend  1:1 Observation. This decision is based on my review of the chart including patient's history and current presentation, interview of the patient, mental status examination, and consideration of suicide risk including evaluating suicidal ideation,  plan, intent, suicidal or self-harm behaviors, risk factors, and protective factors. This judgment is based on our ability to directly address suicide risk, implement suicide prevention strategies, and develop a safety plan while the patient is in the clinical setting. Please contact our team if there is a concern that risk level has changed.  CSSR Risk Category:C-SSRS RISK CATEGORY: No Risk  Suicide Risk Assessment: Patient has following modifiable risk factors for suicide: active suicidal ideation, medication noncompliance, and active mental illness (to encompass adhd, tbi, mania, psychosis, trauma reaction), which we are addressing by inpatient psychiatric treatment. Patient has following non-modifiable or demographic risk factors for suicide: male gender, history of suicide attempt, and psychiatric hospitalization Patient has the following protective factors against suicide: no history of NSSIB  Thank you for this consult request. Recommendations have been communicated to the primary team.  We will continue to follow at this time.   Teresa Wyline CROME, NP       History of Present Illness  Relevant Aspects of Hospital ED Course: Admitted on 10/31/2024 for alcohol  use, AH to kill myself and SI to jump off a bridge.   Per Dr. Jerrol, EDP: 49 year old male with medical history significant for hepatitis C, depression, polysubstance abuse, schizoaffective disorder on Seroquel  who presents emergency department with suicidal ideation. The patient states that he drinks about 1/5 of alcohol  every day. He endorses symptoms of alcohol  withdrawal when he does not drink. Been having command auditory hallucinations telling him to kill himself. He states that he would jump off a bridge. His last drink was this morning. Not been taking his home medications for 2 months.   Patient Report:  Patient states that he is here in the hospital for alcohol  detox, auditory hallucinations telling him to kill himself  and suicidal thoughts to jump off a bridge.  He reports drinking 1/5 of liquor every day for the past 2 years. He states that he last consumed alcohol  this morning around 8 AM. He endorses alcohol  withdrawal symptoms of body shakes and nausea. He reports an alcohol  withdrawal seizure 2 weeks ago and states that he did not go to the hospital. He denies a history of alcohol  DTs. He denies past substance abuse rehabilitation. BAL negative on arrival.  He denies using illicit drugs. UDS negative on arrival. He reports a history of marijuana and methamphetamine use a couple years ago.  He reports suicidal thoughts for the past 2 weeks triggered by auditory hallucinations telling him to kill himself. He endorses suicidal thoughts with a plan to jump off a bridge. He denies suicidal intentions. He reports 1 suicide attempt by taking pills when he was younger. He denies self-harm behaviors. He denies homicidal thoughts.  He describes his mood as feeling depressed and describes his depressive symptoms as feeling hopeless. He reports feeling anxious. He endorses auditory hallucinations telling him to kill himself and visual hallucinations of seeing images of people. He denies paranoia.  He denies outpatient psychiatry and therapy. He denies taking psychotropic medications. He states that he stopped taking his Seroquel  a couple months ago. I discussed with the patient  that he was previously prescribed Seroquel  and Gabapentin  while hospitalized at [facility] back in October 2025. He states that the Seroquel  and Gabapentin  worked for him.  He is homeless. He denies legal issues. He is unemployed and states that he receives disability.  He denies a medical history. He denies taking prescribed medications.  Psych ROS:  Depression: Yes, hopelessness  Anxiety:  Yes, anxious  Mania (lifetime and current): No Psychosis: (lifetime and current): Yes, AVH   Review of Systems  Respiratory:  Negative.    Cardiovascular: Negative.   Gastrointestinal:  Positive for nausea.  Neurological:        Shaky   Psychiatric/Behavioral:  Positive for depression, hallucinations, substance abuse and suicidal ideas.      Psychiatric and Social History  Psychiatric History:  Information collected from the pate  Prev Dx/Sx: schizoaffective disorder, depressed type, alcohol  use disorder, polysubstance abuse and GAD  Current Psych Provider: None. Previously, [facility] (current): Stopped meds a couple months ago. Previously prescribed Seroquel  and Gabapentin   Therapy: No  Prior Psych Hospitalization: Yes, most recent at [facility] from 08/04/24 to 08/11/24 Prior Self Harm: No Prior Violence: No  Family Psych History: No  Family Hx suicide: No  Social History:  Educational Hx: GED Occupational Hx: Unemployed. On disability  Legal Hx: No Living Situation: Homeless Access to weapons/lethal means: No   Substance History Alcohol : Yes, 5th of liquor every day for the past two years  Type of alcohol : Liquor  Last Drink: This morning, 10/31/24 around 8 AM Number of drinks per day: 5th of liquor  History of alcohol  withdrawal seizures Yes, 2 weeks ago. Did not receive medical tx History of DT's No Illicit drugs: No. Reports a history of marijuana use and methamphetamine use a couple years ago Rehab hx: No  Exam Findings  Physical Exam:  Vital Signs:  Temp:  [98.5 F (36.9 C)] 98.5 F (36.9 C) (01/10 1421) Pulse Rate:  [105] 105 (01/10 1421) Resp:  [18] 18 (01/10 1421) BP: (157)/(106) 157/106 (01/10 1421) SpO2:  [99 %] 99 % (01/10 1442) Blood pressure (!) 157/106, pulse (!) 105, temperature 98.5 F (36.9 C), temperature source Oral, resp. rate 18, SpO2 99%. There is no height or weight on file to calculate BMI.  Physical Exam Cardiovascular:     Rate and Rhythm: Tachycardia present.     Comments: Hypertensive  Neurological:     Mental Status: He is alert and oriented to  person, place, and time.     Mental Status Exam: General Appearance: Disheveled  Orientation:  Full (Time, Place, and Person)  Memory:  Immediate;   Fair Recent;   Fair Remote;   Fair  Concentration:  Concentration: Fair  Recall:  Fair  Attention  Fair  Eye Contact:  Fair  Speech:  Clear and Coherent and Slow  Language:  Fair  Volume:  Decreased  Mood: Depressed   Affect:  Congruent  Thought Process:  Coherent and Goal Directed  Thought Content:  WDL  Suicidal Thoughts: Yes, w/plan and no intent   Homicidal Thoughts:  No  Judgement:  Intact  Insight:  Present  Psychomotor Activity:  Normal  Akathisia:  No  Fund of Knowledge:  Fair      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  Family History:  The patient's family history includes Alcohol  abuse in his father; Cancer in his maternal aunt.  Medical History: Past Medical History:  Diagnosis Date   Bipolar disorder (HCC)    Cellulitis    Depression    Drug abuse (HCC)    Hepatitis C     Surgical History: Past Surgical History:  Procedure Laterality Date   BACK SURGERY     ROTATOR CUFF REPAIR     SHOULDER SURGERY       Medications:  Current Medications[1]  Allergies: Allergies[2]  Tavarius Grewe L, NP     [1]  Current Facility-Administered Medications:    folic acid  (FOLVITE ) tablet 1 mg, 1 mg, Oral, Daily, Jerrol Agent, MD, 1 mg at 10/31/24 1433   gabapentin  (NEURONTIN ) capsule 100 mg, 100 mg, Oral, BID, Valdez Brannan L, NP   LORazepam  (ATIVAN ) tablet 1-4 mg, 1-4 mg, Oral, Q1H PRN, 1 mg at 10/31/24 1431 **OR** LORazepam  (ATIVAN ) injection 1-4 mg, 1-4 mg, Intravenous, Q1H PRN, Jerrol Agent, MD   multivitamin with minerals tablet 1 tablet, 1 tablet, Oral, Daily, Jerrol Agent, MD, 1 tablet at  10/31/24 1433   QUEtiapine  (SEROQUEL ) tablet 100 mg, 100 mg, Oral, QHS, Hartlyn Reigel L, NP   thiamine  (VITAMIN B1) tablet 100 mg, 100 mg, Oral, Daily, 100 mg at 10/31/24 1433 **OR** thiamine  (VITAMIN B1) injection 100 mg, 100 mg, Intravenous, Daily, Jerrol Agent, MD  Current Outpatient Medications:    gabapentin  (NEURONTIN ) 300 MG capsule, Take 1 capsule (300 mg total) by mouth 3 (three) times daily., Disp: 90 capsule, Rfl: 0   hydrOXYzine  (ATARAX ) 25 MG tablet, Take 1 tablet (25 mg total) by mouth 3 (three) times daily as needed for anxiety., Disp: 30 tablet, Rfl: 0   Multiple Vitamin (MULTIVITAMIN WITH MINERALS) TABS tablet, Take 1 tablet by mouth daily., Disp: , Rfl:    nicotine  (NICODERM CQ  - DOSED IN MG/24 HOURS) 14 mg/24hr patch, Place 1 patch (14 mg total) onto the skin daily., Disp: , Rfl:    QUEtiapine  (SEROQUEL ) 100 MG tablet, Take 1 tablet (100mg ) by mouth 2 times daily at 8 AM and 3 PM, Disp: 60 tablet, Rfl: 0   QUEtiapine  (SEROQUEL ) 200 MG tablet, Take 1 tablet (200 mg total) by mouth at bedtime., Disp: 30 tablet, Rfl: 0   thiamine  (VITAMIN B-1) 100 MG tablet, Take 1 tablet (100 mg total) by mouth daily., Disp: , Rfl:  [2]  Allergies Allergen Reactions   Lithium Anaphylaxis   Penicillins Rash and Other (See Comments)    Has patient had a PCN reaction causing immediate rash, facial/tongue/throat swelling, SOB or lightheadedness with hypotension: Yes Has patient had a PCN reaction causing severe rash involving mucus membranes or skin necrosis: No Has patient had a PCN reaction that required hospitalization: No Has patient had a PCN reaction occurring within the last 10 years: No If all of the above answers are NO, then may proceed with Cephalosporin use.

## 2024-10-31 NOTE — ED Notes (Signed)
 Pt transported to [facility] by [facility] under IVC.

## 2024-10-31 NOTE — ED Notes (Signed)
 Patient given turkey sandwich and shasta cola.

## 2024-10-31 NOTE — ED Notes (Signed)
Report called to Jane at [facility] 

## 2024-10-31 NOTE — ED Triage Notes (Signed)
 Pt talking to self in room, pt reports last drink this AM, drinks everyday, hx of schizophrenia off meds x 2 months, denies any substance use , reports SI to jump off bridge with intention of acting on thoughts. Pt reports withdrawal seizures after a few days of not drinking.

## 2024-10-31 NOTE — ED Provider Notes (Signed)
 " [place] EMERGENCY DEPARTMENT AT [facility] Provider Note   CSN: [id number] Arrival date & time: 10/31/24  1355     Patient presents with: Suicidal   [name] [name] is a 49 y.o. male.   HPI   49 year old male with medical history significant for hepatitis C, depression, polysubstance abuse, schizoaffective disorder on Seroquel  who presents emergency department with suicidal ideation.  The patient states that he drinks about 1/5 of alcohol  every day.  He endorses symptoms of alcohol  withdrawal when he does not drink.  Been having command auditory hallucinations telling him to kill himself.  He states that he would jump off a bridge.  His last drink was this morning.  Not been taking his home medications for 2 months.  Prior to Admission medications  Medication Sig Start Date End Date Taking? Authorizing Provider  gabapentin  (NEURONTIN ) 300 MG capsule Take 1 capsule (300 mg total) by mouth 3 (three) times daily. 08/11/24   Bennett, Christal H, NP  hydrOXYzine  (ATARAX ) 25 MG tablet Take 1 tablet (25 mg total) by mouth 3 (three) times daily as needed for anxiety. 08/11/24   Bennett, Christal H, NP  Multiple Vitamin (MULTIVITAMIN WITH MINERALS) TABS tablet Take 1 tablet by mouth daily. 08/12/24   Bennett, Christal H, NP  nicotine  (NICODERM CQ  - DOSED IN MG/24 HOURS) 14 mg/24hr patch Place 1 patch (14 mg total) onto the skin daily. 08/12/24   Blair Robin H, NP  QUEtiapine  (SEROQUEL ) 100 MG tablet Take 1 tablet (100mg ) by mouth 2 times daily at 8 AM and 3 PM 08/11/24   Bennett, Christal H, NP  QUEtiapine  (SEROQUEL ) 200 MG tablet Take 1 tablet (200 mg total) by mouth at bedtime. 08/11/24   Bennett, Christal H, NP  thiamine  (VITAMIN B-1) 100 MG tablet Take 1 tablet (100 mg total) by mouth daily. 08/12/24   Blair Robin H, NP    Allergies: Lithium and Penicillins    Review of Systems  All other systems reviewed and are negative.   Updated Vital Signs BP (!) 157/106  (BP Location: Right Arm)   Pulse (!) 105   Temp 98.5 F (36.9 C) (Oral)   Resp 18   SpO2 99%   Physical Exam Vitals and nursing note reviewed.  Constitutional:      General: He is not in acute distress.    Appearance: He is well-developed.  HENT:     Head: Normocephalic and atraumatic.  Eyes:     Conjunctiva/sclera: Conjunctivae normal.  Cardiovascular:     Rate and Rhythm: Regular rhythm. Tachycardia present.     Heart sounds: No murmur heard.    No friction rub.  Pulmonary:     Effort: Pulmonary effort is normal. No respiratory distress.  Abdominal:     General: Abdomen is flat.     Palpations: Abdomen is soft.  Musculoskeletal:        General: No swelling.     Cervical back: Neck supple.  Skin:    General: Skin is warm and dry.     Capillary Refill: Capillary refill takes less than 2 seconds.  Neurological:     Mental Status: He is alert.  Psychiatric:        Attention and Perception: Attention normal. He perceives auditory hallucinations.        Mood and Affect: Mood is anxious and depressed.        Thought Content: Thought content includes suicidal ideation. Thought content includes suicidal plan.     (all  labs ordered are listed, but only abnormal results are displayed) Labs Reviewed  COMPREHENSIVE METABOLIC PANEL WITH GFR - Abnormal; Notable for the following components:      Result Value   Glucose, Bld 103 (*)    Anion gap 17 (*)    All other components within normal limits  CBC - Abnormal; Notable for the following components:   WBC 16.0 (*)    All other components within normal limits  ETHANOL  URINE DRUG SCREEN  MAGNESIUM     EKG: EKG Interpretation Date/Time:  Saturday October 31 2024 14:09:56 EST Ventricular Rate:  108 PR Interval:  125 QRS Duration:  91 QT Interval:  334 QTC Calculation: 448 R Axis:   87  Text Interpretation: Sinus tachycardia Confirmed by Jerrol Agent ([id number]) on 10/31/2024 2:47:22 PM  Radiology: No results  found.   Procedures   Medications Ordered in the ED  LORazepam  (ATIVAN ) tablet 1-4 mg (1 mg Oral Given 10/31/24 1431)    Or  LORazepam  (ATIVAN ) injection 1-4 mg ( Intravenous See Alternative 10/31/24 1431)  thiamine  (VITAMIN B1) tablet 100 mg (100 mg Oral Given 10/31/24 1433)    Or  thiamine  (VITAMIN B1) injection 100 mg ( Intravenous See Alternative 10/31/24 1433)  folic acid  (FOLVITE ) tablet 1 mg (1 mg Oral Given 10/31/24 1433)  multivitamin with minerals tablet 1 tablet (1 tablet Oral Given 10/31/24 1433)                                    Medical Decision Making Amount and/or Complexity of Data Reviewed Labs: ordered.  Risk OTC drugs. Prescription drug management.    49 year old male with medical history significant for hepatitis C, depression, polysubstance abuse, schizoaffective disorder on Seroquel  who presents emergency department with suicidal ideation.  The patient states that he drinks about 1/5 of alcohol  every day.  He endorses symptoms of alcohol  withdrawal when he does not drink.  Been having command auditory hallucinations telling him to kill himself.  He states that he would jump off a bridge.  His last drink was this morning.  Not been taking his home medications for 2 months.  On arrival, the patient was vitally stable, afebrile, mildly tachycardic heart rate 105, mildly hypertensive BP 157/106, not tachypneic, saturating well on room air.  Physical exam concerning for a suicidal patient with a plan with associated command auditory hallucinations to harm himself in the setting of not taking his home medications.  Given his history of alcohol  abuse and withdrawal syndrome, he was placed on CIWA protocol.  Ischial CIWA was noted to be 6.  Workup for medical clearance initiated.  Her evaluation revealed a nonspecific leukocytosis to 16, likely reactive with no infectious symptoms, CMP unremarkable, magnesium  1.9, UDS negative, ethanol level normal.  The patient was placed on  CIWA protocol.  In the setting of his command auditory hallucinations and SI with a plan, IVC paperwork was filed and TTS was consulted.  Patient medically cleared for TTS consultation at this time.     Final diagnoses:  Suicidal ideation  Alcohol  abuse    ED Discharge Orders     None          Jerrol Agent, MD 10/31/24 1609  "

## 2024-11-01 LAB — TSH: TSH: 0.83 u[IU]/mL (ref 0.350–4.500)

## 2024-11-01 LAB — HEMOGLOBIN A1C
Hgb A1c MFr Bld: 5.7 % — ABNORMAL HIGH (ref 4.8–5.6)
Mean Plasma Glucose: 116.89 mg/dL

## 2024-11-01 LAB — LIPID PANEL
Cholesterol: 102 mg/dL (ref 0–200)
HDL: 41 mg/dL
LDL Cholesterol: 29 mg/dL (ref 0–99)
Total CHOL/HDL Ratio: 2.5 ratio
Triglycerides: 164 mg/dL — ABNORMAL HIGH
VLDL: 33 mg/dL (ref 0–40)

## 2024-11-01 MED ORDER — WHITE PETROLATUM EX OINT
TOPICAL_OINTMENT | CUTANEOUS | Status: AC
  Administered 2024-11-01: 1
  Filled 2024-11-01: qty 5

## 2024-11-01 MED ORDER — NICOTINE 21 MG/24HR TD PT24
21.0000 mg | MEDICATED_PATCH | Freq: Every day | TRANSDERMAL | Status: DC
  Administered 2024-11-01 – 2024-11-05 (×5): 21 mg via TRANSDERMAL
  Filled 2024-11-01 (×4): qty 1

## 2024-11-01 NOTE — [facility] Suicide Risk Assessment (Signed)
 [facility] Admission Suicide Risk Assessment   Patient presents with acute risk factors for suicide including endorsed SI and alcohol  use. He carries additional chronic risk factors including male gender, unstable housing, chronic substance use concerns, history of mental illness (schizoaffective disorder), previous psychiatric hospitalizations. These are mitigated by a number of protective factors including no prior suicide attempts, history of presenting to the ED when in crisis and to fulfill needs, currently denying SI and presenting with clear future orientation (wanting help through withdrawal, wanting long term housing). At this time the patient's risk of suicide is considered moderate. Will plan to admit to [facility] and address mood and substance use concerns with medications, group and individual therapy, motivational interviewing and bridge to rehab if agreeable.   I certify that inpatient services furnished can reasonably be expected to improve the patient's condition.   Leita LOISE Arts, MD 11/01/2024, 12:28 PM

## 2024-11-01 NOTE — Plan of Care (Signed)
   Problem: Education: Goal: Knowledge of [facility] Education information/materials will improve Outcome: Progressing   Problem: Activity: Goal: Interest or engagement in activities will improve Outcome: Progressing   Problem: Coping: Goal: Ability to verbalize frustrations and anger appropriately will improve Outcome: Progressing   Problem: Safety: Goal: Periods of time without injury will increase Outcome: Progressing

## 2024-11-01 NOTE — Group Note (Signed)
 Date:  11/01/2024 Time:  6:24 PM  Group Topic/Focus:  Physical Wellness:   This group focuses on introducing patients to Progressive Muscle Relaxation, a technique that involves tensing and then slowly releasing different muscle groups in the body. The goal is to help individuals become more aware of physical tension and stress in their body, and to learn how to consciously release it .   Participation Level:  Did Not Attend  [name] [name] 11/01/2024, 6:24 PM

## 2024-11-01 NOTE — Group Note (Signed)
 Date:  11/01/2024 Time:  7:19 PM  Group Topic/Focus:   Emotional Education:   The focus of this group is to increase self-awareness, emotional expression, autonomy, and self esteem through discussion on control and a lyric substitution activity.   Participation Level:  Did Not Attend    [name] [name] 11/01/2024, 7:19 PM

## 2024-11-01 NOTE — H&P (Addendum)
 " Psychiatric Admission Assessment Adult  Patient Identification: [name] [name] MRN:  [id number] Date of Evaluation:  11/01/2024  Principal Diagnosis: Schizoaffective disorder, depressive type (HCC) Diagnosis:  Principal Problem:   Schizoaffective disorder, depressive type (HCC) Active Problems:   Alcohol  use disorder, severe, dependence (HCC)   Stimulant use disorder, severe, sustained remission   Total time spent with patient: I personally spent 45 minutes on the unit in direct patient care. The direct patient care time included face-to-face time with the patient, reviewing the patient's chart, communicating with other professionals, and coordinating care.  Chief complaint: I'm withdrawing and hearing voices  History of Present Illness:  The patient is a 49 y.o. male with a medical history of hepatits C (currently LFTs WNL) and a psychiatric history most consistent with schizoaffective disorder, depressed type and severe alcohol  use disorder. He presented to [facility] on 10/31/24 for SI with plan to jump off a bridge in the context of alcohol  use. Also reporting several weeks of AH telling him to harm himself. Labs were largely unremarkable: CBC and CMP WNL (LFTs no longer elevated), UDS negative, BAL negative. Additional labs collected today included A1c of 5.7. lipid panel grossly WNL and TSH WNL.   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 He has been on disability from his early 44s for mental health concerns. He is well-known to this service with 2 recent admissions for low mood, psychotic symptoms (AH) and SI in the context of alcohol  use superimposed on underlying schizoaffective disorder. He has numerous inpatient psychiatric hospitalizations, including several within the past year. He has no prior suicide attempts but frequently endorses SI. He drinks liquor of at least 1/5th daily. He has reported a history of prior withdrawal seizures  2-3 days after last drink. Previously has used numerous other substances including meth, cocaine and opioids but has denied recent usage of this, citing alcohol  as primary substance of concern. Previous medications have included olanzapine  (helpful for sleep but not voices) and Seroquel , which he has historically reported best benefit with.   The patient was most recently admitted to [facility] from 08/04/24-08/12/24 when he presented 1 week after most recent discharge with worsening hallucinations and depression in the setting of stopping seroquel  and relapse on alcohol . He was monitored through withdrawal and with seroquel  increased to a total of  100 mg at 8 am and 3pm and 200 mg at bedtime (500 mg TDD). In addition was prescribed gabapentin  100 mg TID. This regiment was effective in treating mood and psychotic concerns. He was set up with intake appointment on [facility] with recommendation to consider their SAIOP. Does not appear to have made this appointment or continued medication as he endorsed being off all medications for approximately 2 months in the ED.   Interview today Today the patient reports he is not good. States he is fatigued and feeling a bit sick due to withdrawal symptoms. He does not report how long he has been drinking (shrugs when asked) but when asked if it had been over a week he states a lot longer than that. Endorses daily use of at least 1/5th of liquor. In addition to withdrawal symptoms, patient notes that he has been hearing voices again and that they are bothersome. He would prefer not to discuss content of the voices. He has been off his Seroquel  for a while. When asked about SI he states not right now. No HI reported, no other concerns except to request  to be left alone to rest.    Did the patient present with any abnormal findings indicating the need for additional neurological or psychological testing?  No   Columbia Scale:  Flowsheet Row [facility]) [facility] Most recent reading at 10/31/2024 10:46 PM ED from 10/31/2024 in [facility] Department at [facility] Most recent reading at 10/31/2024  2:06 PM Admission (Discharged) [facility] Most recent reading at 08/04/2024  6:14 PM  C-SSRS RISK CATEGORY Low Risk No Risk High Risk      Alcohol  Screening: 1. How often do you have a drink containing alcohol ?: Monthly or less 2. How many drinks containing alcohol  do you have on a typical day when you are drinking?: 3 or 4 3. How often do you have six or more drinks on one occasion?: Less than monthly AUDIT-C Score: 3 4. How often during the last year have you found that you were not able to stop drinking once you had started?: Less than monthly 5. How often during the last year have you failed to do what was normally expected from you because of drinking?: Never 6. How often during the last year have you needed a first drink in the morning to get yourself going after a heavy drinking session?: Never 7. How often during the last year have you had a feeling of guilt of remorse after drinking?: Never 8. How often during the last year have you been unable to remember what happened the night before because you had been drinking?: Never 9. Have you or someone else been injured as a result of your drinking?: No 10. Has a relative or friend or a doctor or another health worker been concerned about your drinking or suggested you cut down?: No Alcohol  Use Disorder Identification Test Final Score (AUDIT): 4  Past Medical History:  Past Medical History:  Diagnosis Date   Bipolar disorder (HCC)    Cellulitis    Depression    Drug abuse (HCC)    Hepatitis C     Past Surgical History:  Procedure Laterality Date   BACK SURGERY     ROTATOR CUFF REPAIR     SHOULDER SURGERY     Family History:  Family History  Problem  Relation Age of Onset   Alcohol  abuse Father    Cancer Maternal Aunt    Family Psychiatric  History: alcohol  use in father,  anxiety in mother  Tobacco Screening: Tobacco Use History[1]  BH Tobacco Counseling     Are you interested in Tobacco Cessation Medications?  Yes, implement Nicotene Replacement Protocol Counseled patient on smoking cessation:  Yes Reason Tobacco Screening Not Completed: Patient Refused Screening       Social History:  Social History   Substance and Sexual Activity  Alcohol  Use Yes     Social History   Substance and Sexual Activity  Drug Use Not Currently   Types: Marijuana, Benzodiazepines, Other-see comments, Methamphetamines, Cocaine      Allergies:  Allergies[2] Lab Results:  Results for orders placed or performed during the hospital encounter of 10/31/24 (from the past 48 hours)  Lipid panel     Status: Abnormal   Collection Time: 11/01/24  6:14 AM  Result Value Ref Range   Cholesterol 102 0 - 200 mg/dL    Comment:        ATP III CLASSIFICATION:  <200     mg/dL  Desirable  200-239  mg/dL   Borderline High  >=759    mg/dL   High           Triglycerides 164 (H) <150 mg/dL   HDL 41 >59 mg/dL   Total CHOL/HDL Ratio 2.5 RATIO   VLDL 33 0 - 40 mg/dL   LDL Cholesterol 29 0 - 99 mg/dL    Comment:        Total Cholesterol/HDL:CHD Risk Coronary Heart Disease Risk Table                     Men   Women  1/2 Average Risk   3.4   3.3  Average Risk       5.0   4.4  2 X Average Risk   9.6   7.1  3 X Average Risk  23.4   11.0        Use the calculated Patient Ratio above and the CHD Risk Table to determine the patient's CHD Risk.        ATP III CLASSIFICATION (LDL):  <100     mg/dL   Optimal  899-870  mg/dL   Near or Above                    Optimal  130-159  mg/dL   Borderline  839-810  mg/dL   High  >809     mg/dL   Very High Performed at [facility], [address] [place], [place] [address]   TSH     Status:  None   Collection Time: 11/01/24  6:14 AM  Result Value Ref Range   TSH 0.830 0.350 - 4.500 uIU/mL    Comment: Performed at [facility], [address] [place], [place] [address]  Hemoglobin A1c     Status: Abnormal   Collection Time: 11/01/24  6:14 AM  Result Value Ref Range   Hgb A1c MFr Bld 5.7 (H) 4.8 - 5.6 %    Comment: (NOTE) Diagnosis of Diabetes The following HbA1c ranges recommended by the American Diabetes Association (ADA) may be used as an aid in the diagnosis of diabetes mellitus.  Hemoglobin             Suggested A1C NGSP%              Diagnosis  <5.7                   Non Diabetic  5.7-6.4                Pre-Diabetic  >6.4                   Diabetic  <7.0                   Glycemic control for                       adults with diabetes.     Mean Plasma Glucose 116.89 mg/dL    Comment: Performed at [facility], [address], [place], [place] [address]    Blood Alcohol  level:  Lab Results  Component Value Date   [facility] <15 10/31/2024   ETH 87 (H) 08/03/2024    Metabolic Disorder Labs:  Lab Results  Component Value Date   HGBA1C 5.7 (H) 11/01/2024   MPG 116.89 11/01/2024   MPG 108.28 09/22/2021   No results found for:  PROLACTIN Lab Results  Component Value Date   CHOL 102 11/01/2024   TRIG 164 (H) 11/01/2024   HDL 41 11/01/2024   CHOLHDL 2.5 11/01/2024   VLDL 33 11/01/2024   LDLCALC 29 11/01/2024   LDLCALC 89 10/10/2021    Current Medications: Current Facility-Administered Medications  Medication Dose Route Frequency Provider Last Rate Last Admin   white petrolatum  (VASELINE) gel            acetaminophen  (TYLENOL ) tablet 650 mg  650 mg Oral Q6H PRN White, Patrice L, NP       alum & mag hydroxide-simeth (MAALOX/MYLANTA) 200-200-20 MG/5ML suspension 30 mL  30 mL Oral Q4H PRN White, Patrice L, NP       haloperidol  (HALDOL ) tablet 5 mg  5 mg Oral TID PRN White, Patrice L, NP       And   diphenhydrAMINE  (BENADRYL )  capsule 50 mg  50 mg Oral TID PRN White, Patrice L, NP       haloperidol  lactate (HALDOL ) injection 5 mg  5 mg Intramuscular TID PRN White, Patrice L, NP       And   diphenhydrAMINE  (BENADRYL ) injection 50 mg  50 mg Intramuscular TID PRN White, Patrice L, NP       And   LORazepam  (ATIVAN ) injection 2 mg  2 mg Intramuscular TID PRN White, Patrice L, NP       haloperidol  lactate (HALDOL ) injection 10 mg  10 mg Intramuscular TID PRN White, Patrice L, NP       And   diphenhydrAMINE  (BENADRYL ) injection 50 mg  50 mg Intramuscular TID PRN White, Patrice L, NP       And   LORazepam  (ATIVAN ) injection 2 mg  2 mg Intramuscular TID PRN White, Patrice L, NP       folic acid  (FOLVITE ) tablet 1 mg  1 mg Oral Daily White, Patrice L, NP   1 mg at 11/01/24 0836   gabapentin  (NEURONTIN ) capsule 100 mg  100 mg Oral BID White, Patrice L, NP   100 mg at 11/01/24 9164   hydrOXYzine  (ATARAX ) tablet 25 mg  25 mg Oral TID PRN White, Patrice L, NP   25 mg at 10/31/24 2217   loperamide  (IMODIUM ) capsule 2-4 mg  2-4 mg Oral PRN White, Patrice L, NP       LORazepam  (ATIVAN ) tablet 1 mg  1 mg Oral Q6H PRN White, Patrice L, NP       LORazepam  (ATIVAN ) tablet 1 mg  1 mg Oral QID White, Patrice L, NP   1 mg at 11/01/24 1209   Followed by   NOREEN ON 11/02/2024] LORazepam  (ATIVAN ) tablet 1 mg  1 mg Oral TID White, Patrice L, NP       Followed by   NOREEN ON 11/03/2024] LORazepam  (ATIVAN ) tablet 1 mg  1 mg Oral BID White, Patrice L, NP       Followed by   NOREEN ON 11/05/2024] LORazepam  (ATIVAN ) tablet 1 mg  1 mg Oral Daily White, Patrice L, NP       magnesium  hydroxide (MILK OF MAGNESIA) suspension 30 mL  30 mL Oral Daily PRN White, Patrice L, NP       multivitamin with minerals tablet 1 tablet  1 tablet Oral Daily White, Patrice L, NP   1 tablet at 11/01/24 0836   ondansetron  (ZOFRAN -ODT) disintegrating tablet 4 mg  4 mg Oral Q6H PRN White, Patrice L, NP       QUEtiapine  (SEROQUEL ) tablet  100 mg  100 mg Oral QHS White,  Patrice L, NP   100 mg at 10/31/24 2217   thiamine  (Vitamin B-1) tablet 100 mg  100 mg Oral Daily White, Patrice L, NP   100 mg at 11/01/24 9163   PTA Medications: Medications Prior to Admission  Medication Sig Dispense Refill Last Dose/Taking   gabapentin  (NEURONTIN ) 300 MG capsule Take 1 capsule (300 mg total) by mouth 3 (three) times daily. (Patient not taking: Reported on 11/01/2024) 90 capsule 0 Not Taking   hydrOXYzine  (ATARAX ) 25 MG tablet Take 1 tablet (25 mg total) by mouth 3 (three) times daily as needed for anxiety. (Patient not taking: Reported on 11/01/2024) 30 tablet 0 Not Taking   Multiple Vitamin (MULTIVITAMIN WITH MINERALS) TABS tablet Take 1 tablet by mouth daily. (Patient not taking: Reported on 11/01/2024)   Not Taking   nicotine  (NICODERM CQ  - DOSED IN MG/24 HOURS) 14 mg/24hr patch Place 1 patch (14 mg total) onto the skin daily. (Patient not taking: Reported on 11/01/2024)   Not Taking   QUEtiapine  (SEROQUEL ) 100 MG tablet Take 1 tablet (100mg ) by mouth 2 times daily at 8 AM and 3 PM (Patient not taking: Reported on 11/01/2024) 60 tablet 0 Not Taking   QUEtiapine  (SEROQUEL ) 200 MG tablet Take 1 tablet (200 mg total) by mouth at bedtime. (Patient not taking: Reported on 11/01/2024) 30 tablet 0 Not Taking   thiamine  (VITAMIN B-1) 100 MG tablet Take 1 tablet (100 mg total) by mouth daily. (Patient not taking: Reported on 11/01/2024)   Not Taking    AIMS:  ,  ,  ,  ,  ,  ,    Mental Status exam: Appearance: white male of average BMI, appearing older than stated age, disheveled, seen huddled under blankets Eye contact: fair  Attitude towards examiner: cooperative but withdrawn Psychomotor: no agitation or retardation  Speech: monotonous, short sentances or one-word responses  Language: no delays  Mood: not good  Affect: congruent, blunted  Thought content: denying SI and HI, not expressing clear delusional content  Thought Process: concrete but answering questions linearly   Perception: endorsing AH, no VH, not clearly RTIS  Insight: limited  Judgement: poor based on recent events   Orientation: x3 Attention/Concentration: fair  Memory/Cognition: grossly intact on conversation   Fund of Knowledge: Average    Musculoskeletal: Strength & Muscle Tone: within normal limits Gait & Station: normal Patient leans: N/A   Physical Exam Constitutional:      Appearance: Normal appearance.  HENT:     Head: Normocephalic and atraumatic.  Pulmonary:     Effort: Pulmonary effort is normal.  Musculoskeletal:        General: Normal range of motion.     Cervical back: Normal range of motion.  Neurological:     General: No focal deficit present.     Mental Status: He is alert.    Review of Systems  All other systems reviewed and are negative.  Blood pressure (!) 141/83, pulse 94, temperature 97.9 F (36.6 C), temperature source Oral, resp. rate 20, height 5' 9 (1.753 m), weight 73.5 kg, SpO2 96%. Body mass index is 23.92 kg/m.  Treatment Plan Summary: Daily contact with patient to assess and evaluate symptoms and progress in treatment  Assessment: The patient is a 49 y.o. male with a psychiatric history most consistent with schizoaffective disorder, depressed type, and severe alcohol  use disorder. He has been on disability for mental health concerns from an early age and has  had longstanding psychotic symptoms including disorganization in thoughts/behavior and chronic hallucinations. Along with psychotic symptoms the patient has presented with recurrent depressive episodes characterized by low mood, anhedonia, poor sleep, low energy, and recurrent suicidal thoughts indicating SAD-DT as the most accurate diagnosis. No credible history of mania per patient or on chart review. He additionally presents with daily usage of alcohol  with tolerance, withdrawal (including withdrawal seizures), continued use despite numerous attempts to quit, worsening of mental health  concerns and clear impact on social/occupational functioning. Usage is considered severe. Historically has also used stimulants but has been in remission for some time.   Today the patient presents as concrete in TP, monotonous in speech with blunted affect. Ongoing subjective depression, AH and withdrawal symptoms. Will increase his nighttime Seroquel  to 200 mg given prior TDD of 500 and good response to this in the past for mood and voices. He is on CIWA with an ativan  taper, which will be continued as well. As patient wants to be in the hospital, is demonstrating only mild psychotic symtpoms, and SI has resolved, will have him sign in voluntarily.   DSM-5 diagnoses: Schizoaffective Disorder, depressed type  Alcohol  use disorder, severe, dependence Stimulant use disorder in sustained remission     Plan:  Legal Status: -Voluntary - released from [facility]C and will sign in voluntarily   Safety -q15 minute checks  -elopement, suicide and assault precautions  -daily vitals  Psychiatric Concerns  -Increase Seroquel  to 200 mg at bedtime tonight  (Previously on 500 mg TDD)   -Trazodone  50 mg at bedtime PRN for sleep -Hydroxyzine  25 mg TID PRN for anxiety -haldol  5mg  + ativan  2 mg + benadryl  50 mg PRN for agitation  Substance use concerns  Severe alcohol  use: -CIWA with ativan  taper -thiamine , folic acid , multivitamin -PRN zofran  for nausea -exposure to motivational interviewing and encouragement to pursue rehab or SAIOP  Nicotine  Replacement  N/A - does not use tobacco products   Medical concerns -Per chart review, hx of hepatitis C; currently no elevations in LFTs.   Additional PRNs: -Tylenol  tablets 650 mg every 6 hours as needed for pain -Maalox/Mylanta suspension 30 mL every 4 hours as needed for indigestion  -Milk of Magnesia 30 mL daily as needed for constipation  Labs 1/10 -CBC and CMP WNL (LFTs no longer elevated), UDS negative, BAL negative.  1/11 -A1c of 5.7. lipid  panel grossly WNL and TSH WNL.   Psychosocial interventions  -Motivational interviewing  -daily medication management with psychiatry -Medication education regarding risks/benefits and alternatives -bedside psychotherapy as indicated  -Patient will be encouraged to participate and engage with group therapy  -Appreciate SW assistance in coordinating safe disposition  -Anticipated LOS 7 day    I certify that inpatient services furnished can reasonably be expected to improve the patient's condition.    Leita LOISE Arts, MD 1/11/202612:28 PM     [1]  Social History Tobacco Use  Smoking Status Every Day   Current packs/day: 1.00   Average packs/day: 1 pack/day for 43.0 years (43.0 ttl pk-yrs)   Types: Cigarettes   Start date: 10/22/1981  Smokeless Tobacco Never  [2]  Allergies Allergen Reactions   Lithium Anaphylaxis   Penicillins Rash and Other (See Comments)    Has patient had a PCN reaction causing immediate rash, facial/tongue/throat swelling, SOB or lightheadedness with hypotension: Yes Has patient had a PCN reaction causing severe rash involving mucus membranes or skin necrosis: No Has patient had a PCN reaction that required hospitalization: No Has patient  had a PCN reaction occurring within the last 10 years: No If all of the above answers are NO, then may proceed with Cephalosporin use.    "

## 2024-11-01 NOTE — Progress Notes (Signed)
 [name] [name] is a 49 y.o. male involuntarily admitted for suicide ideation. Pt stated has been drinking daily, not been taking his medication, does not have a place to stay, hoping from place to place, has no family support. Pt is currently homeless and looking for placement. Pt has been calm and cooperative with admission process, alert and oriented, denies SI/HI and verbally contracted for safety. Consents signed, skin/belongings search completed and pt oriented to unit. Pt stable at this time. Pt given the opportunity to express concerns and ask questions. Pt given toiletries. Will continue to monitor.

## 2024-11-01 NOTE — Progress Notes (Signed)
 D:  Patient's self inventory sheet, patient sleeps good, sleep medication helpful.  Good appetite, low energy level, poor concentration.  Rated depression, hopeless, anxiety 9.  Withdrawals, tremors, cravings, agitation.  Denied SI.  Feels very weak and tired.  Denied physical problems.  Goal is feel better physically and attend groups.  Wants long term facility care after [facility] discharge.   A:  Medications administered per MD orders.  Emotional support and encouragement given patient. R:  Denied SI and HI, contracts for safety.  Patient stated he does see shadows, hears mumblings, voices to hurt himself.  Safety maintained with 15 minute checks.

## 2024-11-01 NOTE — BHH Counselor (Signed)
 CSW 1st attempt 11:09 am 11/02/2023, patient stated that he was not ready he will vomit and asked to the assessment later. The CSW let the patient laid back down to rest

## 2024-11-01 NOTE — [facility] Counselor (Signed)
 CSW 2nd attempt, the patient still feel sick. In progression the docs stated that he was withdrawing.

## 2024-11-01 NOTE — Plan of Care (Signed)
 Nurse discussed anxiety, depression and coping skills with patient.

## 2024-11-01 NOTE — Progress Notes (Signed)
 Patient signed voluntary admission and consent for treatment form on 11/01/2024 at 1400.

## 2024-11-01 NOTE — [facility] Group Notes (Signed)
 [facility] Group Notes:  (Nursing/MHT/Case Management/Adjunct)  Date:  11/01/2024  Time:  10:52 PM  Type of Therapy:  Psychoeducational Skills  Participation Level:  Did Not Attend  Participation Quality:  Did not attend.  Affect:  Did not attend.   Cognitive:  Did not attend.   Insight:  None  Engagement in Group:  Did not attend.   Modes of Intervention:  Education  Summary of Progress/Problems: Patient did not attend.   Maury Bamba S 11/01/2024, 10:52 PM

## 2024-11-01 NOTE — Progress Notes (Signed)
(  Sleep Hours) -6.5 (Any PRNs that were needed, meds refused, or side effects to meds)- Vistaril  (Any disturbances and when (visitation, over night)- N/A (Concerns raised by the patient)- none (SI/HI/AVH)- Denies

## 2024-11-01 NOTE — Progress Notes (Signed)
 Pt presented confused this evening, going into other pt's rooms. Pt also noticed to be responding to internal stimuli. Pt requires redirection to his room and did okay while in his room. Will continue to monitor and redirect.

## 2024-11-01 NOTE — Group Note (Signed)
 Date:  11/01/2024 Time:  7:27 PM  Group Topic/Focus:  Healthy Relationships - Description of what a healthy relationship looks like and how each person needs to be active in the relationship.     Participation Level:  Did Not Attend   [name] [name] 11/01/2024, 7:27 PM

## 2024-11-01 NOTE — Group Note (Signed)
 Date:  11/01/2024 Time:  6:16 PM  Group Topic/Focus:  Goals Group:   The focus of this group is to help patients establish daily goals to achieve during treatment and discuss how the patient can incorporate goal setting into their daily lives to aide in recovery. Orientation:   The focus of this group is to educate the patient on the purpose and policies of crisis stabilization and provide a format to answer questions about their admission.  The group details unit policies and expectations of patients while admitted.    Participation Level:  Did Not Attend   [name] [name] Mars 11/01/2024, 6:16 PM

## 2024-11-02 ENCOUNTER — Encounter (HOSPITAL_COMMUNITY): Payer: Self-pay

## 2024-11-02 DIAGNOSIS — F1020 Alcohol dependence, uncomplicated: Secondary | ICD-10-CM

## 2024-11-02 DIAGNOSIS — F251 Schizoaffective disorder, depressive type: Principal | ICD-10-CM

## 2024-11-02 DIAGNOSIS — F1521 Other stimulant dependence, in remission: Secondary | ICD-10-CM

## 2024-11-02 MED ORDER — QUETIAPINE FUMARATE 50 MG PO TABS
50.0000 mg | ORAL_TABLET | ORAL | Status: DC
  Administered 2024-11-02 – 2024-11-05 (×6): 50 mg via ORAL
  Filled 2024-11-02 (×6): qty 1

## 2024-11-02 MED ORDER — OLANZAPINE 5 MG PO TABS
5.0000 mg | ORAL_TABLET | Freq: Four times a day (QID) | ORAL | Status: DC | PRN
  Administered 2024-11-04 – 2024-11-05 (×2): 5 mg via ORAL
  Filled 2024-11-02 (×2): qty 1

## 2024-11-02 NOTE — Group Note (Signed)
 Recreation Therapy Group Note   Group Topic:Stress Management  Group Date: 11/02/2024 Start Time: 9056 End Time: 1058 Facilitators: Mirriam Vadala-McCall, LRT,CTRS Location: [address] Hall Dayroom   Group Topic: Stress Management  Goal Area(s) Addresses:  Patient will identify positive stress management techniques. Patient will identify benefits of using stress management post d/c.  Behavioral Response:   Intervention: Let's Meditate App  Activity: LRT played a meditation called Turning the Page. The meditation encouraged patients to reflect on the past year, acknowledge their journey and everyday take each breath as a new step in towards their future goals.    Education:  Stress Management, Discharge Planning.   Education Outcome: Acknowledges Education   Affect/Mood: N/A   Participation Level: Did not attend    Clinical Observations/Individualized Feedback:      Plan: Continue to engage patient in RT group sessions 2-3x/week.   Jaclynne Baldo-McCall, LRT,CTRS 11/02/2024 1:33 PM

## 2024-11-02 NOTE — Progress Notes (Signed)
 [facility] MD Progress Note  11/02/2024 8:34 AM [name] [name]  MRN:  [id number]  Principal Problem: Schizoaffective disorder, depressive type (HCC) Diagnosis: Principal Problem:   Schizoaffective disorder, depressive type (HCC) Active Problems:   Alcohol  use disorder, severe, dependence (HCC)   Stimulant use disorder, severe, sustained remission  Total Time spent with patient:  I personally spent 30 minutes on the unit in direct patient care. The direct patient care time included face-to-face time with the patient, reviewing the patient's chart, communicating with other professionals, and coordinating care.   Identifying Information and Past Psychiatric History:  The patient is a 49 y.o. male with a medical history of hepatits C (currently LFTs WNL) and a psychiatric history most consistent with schizoaffective disorder, depressed type and severe alcohol  use disorder currently admitted voluntarily (IVC released on admission) with AH and SI with plan to jump off a bridge in the context of alcohol  use and medication non-adherance x2 months.  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 He has been on disability from his early 49s for mental health concerns. He is well-known to this service with 2 recent admissions for low mood, psychotic symptoms (AH) and SI in the context of alcohol  use superimposed on underlying schizoaffective disorder. He has numerous inpatient psychiatric hospitalizations, including several within the past year. Most recently admitted to [facility] from 08/04/24-08/12/24 when he presented 1 week after most recent discharge with worsening hallucinations and depression in the setting of stopping seroquel  and relapse on alcohol . He was monitored through withdrawal and with seroquel  increased to a total of  100 mg at 8 am and 3pm and 200 mg at bedtime (500 mg TDD). In addition was prescribed gabapentin  100 mg TID. He has no prior suicide attempts but frequently  endorses SI. He drinks liquor of at least 1/5th daily. He has reported a history of prior withdrawal seizures 2-3 days after last drink. Previously has used numerous other substances including meth, cocaine and opioids but has denied recent usage of this, citing alcohol  as primary substance of concern. Previous medications have included olanzapine  (helpful for sleep but not voices) and Seroquel , which he has historically reported best benefit with.   Interval events: Patient evaluated at [facility] yesterday as noted above and below. Seroquel  increased to 200 mg at bedtime yesterday. Documented to be isolative to room, endorsing ongoing withdrawal symtpoms. Remains on ativan  taper and on CIWA. Patient denying SI, hI and AVH yesterday - signed voluntary and released from IVC. Patien was notablyh moved to 500 hall due to walking into others rooms last evening (patient reports he was confused). Was RTIS. Slept 8 hrs. BP (!) 156/93 (BP Location: Right Arm)   Pulse (!) 107   Temp 97.9 F (36.6 C) (Oral)   Resp 20   Ht 5' 9 (1.753 m)   Wt 73.5 kg   SpO2 100%   BMI 23.92 kg/m    Interview today: Today the patient states he is okay. Reports he got moved to the room on 500 so he would have his own space. Notes that he was walking into other peoples' rooms last night. Denies doing this on purpose, states he got confused about which room he was supposed to be in. Continuing to hear voices about the same as yesterday and mood is still low, however not having any suicidal thoughts today. No HI. Slept okay last evening after moving rooms. States his goal for this hospital is to get back on his medications again.  Continues to report no interest in rehab after the hospital and intent to do AA instead. States he has an air cabin crew house that will take him whenever he is finished with the hospital. No additional questions or concerns     Past Medical History:  Past Medical History:  Diagnosis Date   Bipolar disorder  (HCC)    Cellulitis    Depression    Drug abuse (HCC)    Hepatitis C     Past Surgical History:  Procedure Laterality Date   BACK SURGERY     ROTATOR CUFF REPAIR     SHOULDER SURGERY     Family History:  Family History  Problem Relation Age of Onset   Alcohol  abuse Father    Cancer Maternal Aunt     Social History:  Social History   Substance and Sexual Activity  Alcohol  Use Yes     Social History   Substance and Sexual Activity  Drug Use Not Currently   Types: Marijuana, Benzodiazepines, Other-see comments, Methamphetamines, Cocaine    Social History   Socioeconomic History   Marital status: Divorced    Spouse name: Not on file   Number of children: 2   Years of education: Not on file   Highest education level: GED or equivalent  Occupational History   Not on file  Tobacco Use   Smoking status: Every Day    Current packs/day: 1.00    Average packs/day: 1 pack/day for 43.0 years (43.0 ttl pk-yrs)    Types: Cigarettes    Start date: 10/22/1981   Smokeless tobacco: Never  Vaping Use   Vaping status: Never Used  Substance and Sexual Activity   Alcohol  use: Yes   Drug use: Not Currently    Types: Marijuana, Benzodiazepines, Other-see comments, Methamphetamines, Cocaine   Sexual activity: Not Currently  Other Topics Concern   Not on file  Social History Narrative   Not on file   Social Drivers of Health   Tobacco Use: High Risk (10/31/2024)   Patient History    Smoking Tobacco Use: Every Day    Smokeless Tobacco Use: Never    Passive Exposure: Not on file  Financial Resource Strain: High Risk (04/10/2023)   Received from [facility]   Overall Financial Resource Strain (CARDIA)    Difficulty of Paying Living Expenses: Hard  Food Insecurity: Food Insecurity Present (10/31/2024)   Epic    Worried About Programme Researcher, Broadcasting/film/video in the Last Year: Often true    Barista in the Last Year: Often true  Transportation Needs: Unmet Transportation  Needs (10/31/2024)   Epic    Lack of Transportation (Medical): Yes    Lack of Transportation (Non-Medical): Yes  Physical Activity: Inactive (04/10/2023)   Received from [facility]   Exercise Vital Sign    On average, how many days per week do you engage in moderate to strenuous exercise (like a brisk walk)?: 0 days    On average, how many minutes do you engage in exercise at this level?: 0 min  Stress: Stress Concern Present (04/22/2024)   Received from [facility] - Occupational Stress Questionnaire    Feeling of Stress : Rather much  Social Connections: Unknown (10/31/2024)   Social Connection and Isolation Panel    Frequency of Communication with Friends and Family: Patient declined    Frequency of Social Gatherings with Friends and Family: Patient declined    Attends  Religious Services: Patient declined    Active Member of Clubs or Organizations: Not on file    Attends Club or Organization Meetings: Patient declined    Marital Status: Patient declined  Depression (PHQ2-9): Not on file  Alcohol  Screen: Low Risk (10/31/2024)   Alcohol  Screen    Last Alcohol  Screening Score (AUDIT): 4  Housing: High Risk (10/31/2024)   Epic    Unable to Pay for Housing in the Last Year: Yes    Number of Times Moved in the Last Year: 8    Homeless in the Last Year: Yes  Utilities: Patient Declined (10/31/2024)   Epic    Threatened with loss of utilities: Patient declined  Health Literacy: Not on file     Current Medications: Current Facility-Administered Medications  Medication Dose Route Frequency Provider Last Rate Last Admin   acetaminophen  (TYLENOL ) tablet 650 mg  650 mg Oral Q6H PRN White, Patrice L, NP       alum & mag hydroxide-simeth (MAALOX/MYLANTA) 200-200-20 MG/5ML suspension 30 mL  30 mL Oral Q4H PRN White, Patrice L, NP       haloperidol  (HALDOL ) tablet 5 mg  5 mg Oral TID PRN White, Patrice L, NP       And    diphenhydrAMINE  (BENADRYL ) capsule 50 mg  50 mg Oral TID PRN White, Patrice L, NP       haloperidol  lactate (HALDOL ) injection 5 mg  5 mg Intramuscular TID PRN White, Patrice L, NP       And   diphenhydrAMINE  (BENADRYL ) injection 50 mg  50 mg Intramuscular TID PRN White, Patrice L, NP       And   LORazepam  (ATIVAN ) injection 2 mg  2 mg Intramuscular TID PRN White, Patrice L, NP       haloperidol  lactate (HALDOL ) injection 10 mg  10 mg Intramuscular TID PRN White, Patrice L, NP       And   diphenhydrAMINE  (BENADRYL ) injection 50 mg  50 mg Intramuscular TID PRN White, Patrice L, NP       And   LORazepam  (ATIVAN ) injection 2 mg  2 mg Intramuscular TID PRN White, Patrice L, NP       folic acid  (FOLVITE ) tablet 1 mg  1 mg Oral Daily White, Patrice L, NP   1 mg at 11/02/24 0756   gabapentin  (NEURONTIN ) capsule 100 mg  100 mg Oral BID White, Patrice L, NP   100 mg at 11/02/24 0756   hydrOXYzine  (ATARAX ) tablet 25 mg  25 mg Oral TID PRN White, Patrice L, NP   25 mg at 10/31/24 2217   loperamide  (IMODIUM ) capsule 2-4 mg  2-4 mg Oral PRN White, Patrice L, NP       LORazepam  (ATIVAN ) tablet 1 mg  1 mg Oral Q6H PRN White, Patrice L, NP       LORazepam  (ATIVAN ) tablet 1 mg  1 mg Oral TID White, Patrice L, NP       Followed by   NOREEN ON 11/03/2024] LORazepam  (ATIVAN ) tablet 1 mg  1 mg Oral BID White, Patrice L, NP       Followed by   NOREEN ON 11/05/2024] LORazepam  (ATIVAN ) tablet 1 mg  1 mg Oral Daily White, Patrice L, NP       magnesium  hydroxide (MILK OF MAGNESIA) suspension 30 mL  30 mL Oral Daily PRN White, Patrice L, NP       multivitamin with minerals tablet 1 tablet  1 tablet Oral Daily White,  Wyline CROME, NP   1 tablet at 11/02/24 0756   nicotine  (NICODERM CQ  - dosed in mg/24 hours) patch 21 mg  21 mg Transdermal Daily Prentis Kitchens A, DO   21 mg at 11/02/24 0755   ondansetron  (ZOFRAN -ODT) disintegrating tablet 4 mg  4 mg Oral Q6H PRN White, Patrice L, NP       QUEtiapine  (SEROQUEL ) tablet 100 mg   100 mg Oral QHS White, Patrice L, NP   100 mg at 11/01/24 2117   thiamine  (Vitamin B-1) tablet 100 mg  100 mg Oral Daily White, Patrice L, NP   100 mg at 11/02/24 9243    Lab Results:  Results for orders placed or performed during the hospital encounter of 10/31/24 (from the past 48 hours)  Lipid panel     Status: Abnormal   Collection Time: 11/01/24  6:14 AM  Result Value Ref Range   Cholesterol 102 0 - 200 mg/dL    Comment:        ATP III CLASSIFICATION:  <200     mg/dL   Desirable  799-760  mg/dL   Borderline High  >=759    mg/dL   High           Triglycerides 164 (H) <150 mg/dL   HDL 41 >59 mg/dL   Total CHOL/HDL Ratio 2.5 RATIO   VLDL 33 0 - 40 mg/dL   LDL Cholesterol 29 0 - 99 mg/dL    Comment:        Total Cholesterol/HDL:CHD Risk Coronary Heart Disease Risk Table                     Men   Women  1/2 Average Risk   3.4   3.3  Average Risk       5.0   4.4  2 X Average Risk   9.6   7.1  3 X Average Risk  23.4   11.0        Use the calculated Patient Ratio above and the CHD Risk Table to determine the patient's CHD Risk.        ATP III CLASSIFICATION (LDL):  <100     mg/dL   Optimal  899-870  mg/dL   Near or Above                    Optimal  130-159  mg/dL   Borderline  839-810  mg/dL   High  >809     mg/dL   Very High Performed at [facility], [address] [place], [place] [address]   TSH     Status: None   Collection Time: 11/01/24  6:14 AM  Result Value Ref Range   TSH 0.830 0.350 - 4.500 uIU/mL    Comment: Performed at [facility], [address], [place], [place] [address]  Hemoglobin A1c     Status: Abnormal   Collection Time: 11/01/24  6:14 AM  Result Value Ref Range   Hgb A1c MFr Bld 5.7 (H) 4.8 - 5.6 %    Comment: (NOTE) Diagnosis of Diabetes The following HbA1c ranges recommended by the American Diabetes Association (ADA) may be used as an aid in the diagnosis of diabetes mellitus.  Hemoglobin              Suggested A1C NGSP%              Diagnosis  <5.7  Non Diabetic  5.7-6.4                Pre-Diabetic  >6.4                   Diabetic  <7.0                   Glycemic control for                       adults with diabetes.     Mean Plasma Glucose 116.89 mg/dL    Comment: Performed at [facility], [address] [place], [place] [address]    Blood Alcohol  level:  Lab Results  Component Value Date   ETH <15 10/31/2024   ETH 87 (H) 08/03/2024    Metabolic Disorder Labs: Lab Results  Component Value Date   HGBA1C 5.7 (H) 11/01/2024   MPG 116.89 11/01/2024   MPG 108.28 09/22/2021   No results found for: PROLACTIN Lab Results  Component Value Date   CHOL 102 11/01/2024   TRIG 164 (H) 11/01/2024   HDL 41 11/01/2024   CHOLHDL 2.5 11/01/2024   VLDL 33 11/01/2024   LDLCALC 29 11/01/2024   LDLCALC 89 10/10/2021    Physical Findings: AIMS:  ,  ,  ,  ,  ,  ,   CIWA:  CIWA-Ar Total: 4 COWS:     Mental Status exam: Appearance: white male of average BMI, appearing older than stated age, seen appropriately groomed in jeans and button up shirt, sitting in room Eye contact: fair to good - improved Attitude towards examiner: cooperative, better engaged today Psychomotor: no agitation or retardation  Speech: monotonous, short sentances or one-word responses  Language: no delays  Mood: okay  Affect: congruent, neutral, remains restricted in range   Thought content: denying SI and HI, not expressing clear delusional content  Thought Process: concrete but answering questions linearly  Perception: endorsing AH, no VH, not clearly RTIS  Insight: limited  Judgement: poor based on recent events    Orientation: x3 Attention/Concentration: fair  Memory/Cognition: grossly intact on conversation   Fund of Knowledge: Average      Musculoskeletal: Strength & Muscle Tone: within normal limits Gait & Station: normal Patient leans: N/A   Physical  Exam Constitutional:      Appearance: Normal appearance.  HENT:     Head: Normocephalic and atraumatic.  Abdominal:     General: There is no distension.  Musculoskeletal:        General: Normal range of motion.     Cervical back: Normal range of motion.  Neurological:     General: No focal deficit present.     Mental Status: He is alert.    Review of Systems  Constitutional:        Tremor, nausea  All other systems reviewed and are negative.  Blood pressure (!) 156/93, pulse (!) 107, temperature 97.9 F (36.6 C), temperature source Oral, resp. rate 20, height 5' 9 (1.753 m), weight 73.5 kg, SpO2 100%. Body mass index is 23.92 kg/m.   Treatment Plan Summary: Daily contact with patient to assess and evaluate symptoms and progress in treatment   Assessment: The patient is a 49 y.o. male with a psychiatric history most consistent with schizoaffective disorder, depressed type, and severe alcohol  use disorder. He has been on disability for mental health concerns from an early age and has had longstanding psychotic symptoms including disorganization in thoughts/behavior and  chronic hallucinations. Along with psychotic symptoms the patient has presented with recurrent depressive episodes characterized by low mood, anhedonia, poor sleep, low energy, and recurrent suicidal thoughts indicating SAD-DT as the most accurate diagnosis. No credible history of mania per patient or on chart review. He additionally presents with daily usage of alcohol  with tolerance, withdrawal (including withdrawal seizures), continued use despite numerous attempts to quit, worsening of mental health concerns and clear impact on social/occupational functioning. Usage is considered severe. Historically has also used stimulants but has been in remission for some time.    On admission was concrete in TP, monotonous in speech with blunted affect. Ongoing subjective depression, AH and withdrawal symptoms. Otherwise no  additional overt signs of psychosis and was denying AVH, wanting to be in the hospital and was released from IVC. Seroquel  to 200 mg at bedtime (prior TDD of 500).  Overnight patient had to be moved to 500 due to walking into other peoples rooms, was RTIS per staff at that time. Today the patient is calm with better eye contact. Continuing to endorse AH that had not significantly changed. Will order additional 50 mg Seroquel  for 8 am and 4 pm in addition to nighttime dose.    DSM-5 diagnoses: Schizoaffective Disorder, depressed type  Alcohol  use disorder, severe, dependence Stimulant use disorder in sustained remission        Plan:   Legal Status: -Voluntary - released from IVC and will sign in voluntarily    Safety -q15 minute checks  -elopement, suicide and assault precautions  -daily vitals   Psychiatric Concerns  -Increase Seroquel  to 50 mg at 8 amd and 4 pm + 200 mg at bedtime for psychosis             (Previously on 500 mg TDD)     -Trazodone  50 mg at bedtime PRN for sleep -Hydroxyzine  25 mg TID PRN for anxiety -haldol  5mg  + ativan  2 mg + benadryl  50 mg PRN for agitation   Substance use concerns  Severe alcohol  use: -CIWA with ativan  taper -thiamine , folic acid , multivitamin -PRN zofran  for nausea -exposure to motivational interviewing and encouragement to pursue rehab or SAIOP   Nicotine  Replacement  N/A - does not use tobacco products    Medical concerns -Per chart review, hx of hepatitis C; currently no elevations in LFTs.    Additional PRNs: -Tylenol  tablets 650 mg every 6 hours as needed for pain -Maalox/Mylanta suspension 30 mL every 4 hours as needed for indigestion  -Milk of Magnesia 30 mL daily as needed for constipation   Labs 1/10 -CBC and CMP WNL (LFTs no longer elevated), UDS negative, BAL negative.  1/11 -A1c of 5.7. lipid panel grossly WNL and TSH WNL.    Psychosocial interventions  -Motivational interviewing  -daily medication management  with psychiatry -Medication education regarding risks/benefits and alternatives -bedside psychotherapy as indicated  -Patient will be encouraged to participate and engage with group therapy  -Appreciate SW assistance in coordinating safe disposition  -Anticipated LOS 5-7 days    Leita LOISE Arts, MD 11/02/2024, 8:34 AM

## 2024-11-02 NOTE — [facility] Group Notes (Signed)
 Adult Psychoeducational Group Note  Date:  11/02/2024 Time:  1:20 PM  Group Topic/Focus: Physical Wellness  Participation Level:  Did Not Attend  Participation Quality:    Affect:    Cognitive:    Insight:   Engagement in Group:    Modes of Intervention:    Additional Comments:    [place] O 11/02/2024, 1:20 PM

## 2024-11-02 NOTE — Progress Notes (Signed)
 [facility] MD Progress Note  11/03/2024 3:45 PM [name] [name]  MRN:  [id number] Subjective:   [name] [name] is a 49 yr old male who presented on 1/10 to [facility] with SI w/ a plan (jump off bridge) and command hallucinations in the setting of EtOH Abuse and not taking medications for 2 months, he was admitted to [facility] on 1/11.  PPHx is significant for Schizoaffective Disorder, Polysubstance Abuse (EtOH, Meth Cocaine, Opioids), and Multiple Prior Psychiatric Hospitalizations (last [facility] 07/2024), and no history of Suicide Attempts.  PMHx is significant for Withdrawal Seizures.   Case was discussed in the multidisciplinary team. MAR was reviewed and patient was compliant with medications.  We received PRN Tylenol , Hydroxyzine , and Zofran  yesterday.   Psychiatric Team made the following recommendations yesterday: -Continue Seroquel  50 mg BID for psychosis and mood stability -Continue Seroquel  100 mg QHS for psychosis and mood stability -Continue Gabapentin  100 mg BID for anxiety and EtOH Abuse   On interview today patient reports he slept good last night.  He reports his appetite is doing good.  He reports no SI, HI, or AVH.  He reports no Paranoia or Ideas of Reference.  He reports no issues with his medications.  He reports no withdrawal symptoms.   He asks when he will be discharged.  Discussed with him that given his history of Withdrawal Seizures we would plan for discharge after completing the Ativan  taper and he reported understanding and agreement.  He reports no other concerns at present.  Principal Problem: Schizoaffective disorder, depressive type (HCC) Diagnosis: Principal Problem:   Schizoaffective disorder, depressive type (HCC) Active Problems:   Alcohol  use disorder, severe, dependence (HCC)   Stimulant use disorder, severe, sustained remission  Total Time spent with patient:  I personally spent 35 minutes on the unit in direct patient care. The direct patient care time included face-to-face  time with the patient, reviewing the patient's chart, communicating with other professionals, and coordinating care.    Past Psychiatric History: Schizoaffective Disorder, Polysubstance Abuse (EtOH, Meth Cocaine, Opioids), and Multiple Prior Psychiatric Hospitalizations (last [facility] 07/2024), and no history of Suicide Attempts.  Past Medical History:  Past Medical History:  Diagnosis Date   Bipolar disorder (HCC)    Cellulitis    Depression    Drug abuse (HCC)    Hepatitis C     Past Surgical History:  Procedure Laterality Date   BACK SURGERY     ROTATOR CUFF REPAIR     SHOULDER SURGERY     Family History:  Family History  Problem Relation Age of Onset   Alcohol  abuse Father    Cancer Maternal Aunt    Family Psychiatric  History:  Mother- Anxiety Father- EtOH Abuse  Social History:  Social History   Substance and Sexual Activity  Alcohol  Use Yes     Social History   Substance and Sexual Activity  Drug Use Not Currently   Types: Marijuana, Benzodiazepines, Other-see comments, Methamphetamines, Cocaine    Social History   Socioeconomic History   Marital status: Divorced    Spouse name: Not on file   Number of children: 2   Years of education: Not on file   Highest education level: GED or equivalent  Occupational History   Not on file  Tobacco Use   Smoking status: Every Day    Current packs/day: 1.00    Average packs/day: 1 pack/day for 43.0 years (43.0 ttl pk-yrs)    Types: Cigarettes    Start date: 10/22/1981  Smokeless tobacco: Never  Vaping Use   Vaping status: Never Used  Substance and Sexual Activity   Alcohol  use: Yes   Drug use: Not Currently    Types: Marijuana, Benzodiazepines, Other-see comments, Methamphetamines, Cocaine   Sexual activity: Not Currently  Other Topics Concern   Not on file  Social History Narrative   Not on file   Social Drivers of Health   Tobacco Use: High Risk (10/31/2024)   Patient History    Smoking Tobacco Use:  Every Day    Smokeless Tobacco Use: Never    Passive Exposure: Not on file  Financial Resource Strain: High Risk (04/10/2023)   Received from [facility]   Overall Financial Resource Strain (CARDIA)    Difficulty of Paying Living Expenses: Hard  Food Insecurity: Food Insecurity Present (10/31/2024)   Epic    Worried About Programme Researcher, Broadcasting/film/video in the Last Year: Often true    Barista in the Last Year: Often true  Transportation Needs: Unmet Transportation Needs (10/31/2024)   Epic    Lack of Transportation (Medical): Yes    Lack of Transportation (Non-Medical): Yes  Physical Activity: Inactive (04/10/2023)   Received from [facility]   Exercise Vital Sign    On average, how many days per week do you engage in moderate to strenuous exercise (like a brisk walk)?: 0 days    On average, how many minutes do you engage in exercise at this level?: 0 min  Stress: Stress Concern Present (04/22/2024)   Received from [facility] of Occupational Health - Occupational Stress Questionnaire    Feeling of Stress : Rather much  Social Connections: Unknown (10/31/2024)   Social Connection and Isolation Panel    Frequency of Communication with Friends and Family: Patient declined    Frequency of Social Gatherings with Friends and Family: Patient declined    Attends Religious Services: Patient declined    Active Member of Clubs or Organizations: Not on file    Attends Banker Meetings: Patient declined    Marital Status: Patient declined  Depression (PHQ2-9): Not on file  Alcohol  Screen: Low Risk (10/31/2024)   Alcohol  Screen    Last Alcohol  Screening Score (AUDIT): 4  Housing: High Risk (10/31/2024)   Epic    Unable to Pay for Housing in the Last Year: Yes    Number of Times Moved in the Last Year: 8    Homeless in the Last Year: Yes  Utilities: Patient Declined (10/31/2024)   Epic    Threatened with loss of utilities: Patient  declined  Health Literacy: Not on file   Additional Social History:                         Sleep: Good Estimated Sleeping Duration (Last 24 Hours): 5.50-6.25 hours  Appetite:  Good  Current Medications: Current Facility-Administered Medications  Medication Dose Route Frequency Provider Last Rate Last Admin   acetaminophen  (TYLENOL ) tablet 650 mg  650 mg Oral Q6H PRN White, Patrice L, NP   650 mg at 11/02/24 1821   alum & mag hydroxide-simeth (MAALOX/MYLANTA) 200-200-20 MG/5ML suspension 30 mL  30 mL Oral Q4H PRN White, Patrice L, NP       haloperidol  lactate (HALDOL ) injection 5 mg  5 mg Intramuscular TID PRN White, Patrice L, NP       And   diphenhydrAMINE  (BENADRYL ) injection 50 mg  50 mg  Intramuscular TID PRN White, Patrice L, NP       And   LORazepam  (ATIVAN ) injection 2 mg  2 mg Intramuscular TID PRN White, Patrice L, NP       haloperidol  lactate (HALDOL ) injection 10 mg  10 mg Intramuscular TID PRN White, Patrice L, NP       And   diphenhydrAMINE  (BENADRYL ) injection 50 mg  50 mg Intramuscular TID PRN White, Patrice L, NP       And   LORazepam  (ATIVAN ) injection 2 mg  2 mg Intramuscular TID PRN White, Patrice L, NP       folic acid  (FOLVITE ) tablet 1 mg  1 mg Oral Daily White, Patrice L, NP   1 mg at 11/03/24 9090   gabapentin  (NEURONTIN ) capsule 100 mg  100 mg Oral BID White, Patrice L, NP   100 mg at 11/03/24 9090   hydrOXYzine  (ATARAX ) tablet 25 mg  25 mg Oral TID PRN White, Patrice L, NP   25 mg at 11/02/24 1821   loperamide  (IMODIUM ) capsule 2-4 mg  2-4 mg Oral PRN White, Patrice L, NP       LORazepam  (ATIVAN ) tablet 1 mg  1 mg Oral Q6H PRN White, Patrice L, NP       LORazepam  (ATIVAN ) tablet 1 mg  1 mg Oral BID White, Patrice L, NP       Followed by   NOREEN ON 11/05/2024] LORazepam  (ATIVAN ) tablet 1 mg  1 mg Oral Daily White, Patrice L, NP       magnesium  hydroxide (MILK OF MAGNESIA) suspension 30 mL  30 mL Oral Daily PRN White, Patrice L, NP        multivitamin with minerals tablet 1 tablet  1 tablet Oral Daily White, Patrice L, NP   1 tablet at 11/03/24 9090   nicotine  (NICODERM CQ  - dosed in mg/24 hours) patch 21 mg  21 mg Transdermal Daily Prentis Kitchens A, DO   21 mg at 11/03/24 0911   OLANZapine  (ZYPREXA ) tablet 5 mg  5 mg Oral Q6H PRN Towana Leita SAILOR, MD       ondansetron  (ZOFRAN -ODT) disintegrating tablet 4 mg  4 mg Oral Q6H PRN White, Patrice L, NP   4 mg at 11/02/24 1822   QUEtiapine  (SEROQUEL ) tablet 100 mg  100 mg Oral QHS White, Patrice L, NP   100 mg at 11/02/24 2048   QUEtiapine  (SEROQUEL ) tablet 50 mg  50 mg Oral [id number] Towana Leita SAILOR, MD   50 mg at 11/03/24 9090   thiamine  (Vitamin B-1) tablet 100 mg  100 mg Oral Daily White, Patrice L, NP   100 mg at 11/03/24 0909    Lab Results:  No results found for this or any previous visit (from the past 48 hours).   Blood Alcohol  level:  Lab Results  Component Value Date   ETH <15 10/31/2024   ETH 87 (H) 08/03/2024    Metabolic Disorder Labs: Lab Results  Component Value Date   HGBA1C 5.7 (H) 11/01/2024   MPG 116.89 11/01/2024   MPG 108.28 09/22/2021   No results found for: PROLACTIN Lab Results  Component Value Date   CHOL 102 11/01/2024   TRIG 164 (H) 11/01/2024   HDL 41 11/01/2024   CHOLHDL 2.5 11/01/2024   VLDL 33 11/01/2024   LDLCALC 29 11/01/2024   LDLCALC 89 10/10/2021    Physical Findings: AIMS:  ,  ,  ,  ,  ,  ,   CIWA:  CIWA-Ar Total: 2 COWS:     Musculoskeletal: Strength & Muscle Tone: within normal limits Gait & Station: normal Patient leans: N/A  Psychiatric Specialty Exam:  Presentation  General Appearance:  Disheveled  Eye Contact: Good  Speech: Clear and Coherent; Normal Rate  Speech Volume: Normal  Handedness: Right   Mood and Affect  Mood: -- (ok)  Affect: Appropriate (some constriction)   Thought Process  Thought Processes: Coherent; Linear  Descriptions of Associations:Intact  Orientation:Full  (Time, Place and Person)  Thought Content:WDL  History of Schizophrenia/Schizoaffective disorder:Yes  Duration of Psychotic Symptoms:Greater than six months  Hallucinations:Hallucinations: None  Ideas of Reference:None  Suicidal Thoughts:Suicidal Thoughts: No  Homicidal Thoughts:Homicidal Thoughts: No   Sensorium  Memory: Recent Fair; Immediate Fair  Judgment: Intact  Insight: Present   Executive Functions  Concentration: Fair  Attention Span: Fair  Recall: Fair  Fund of Knowledge: Fair  Language: Fair   Psychomotor Activity  Psychomotor Activity:Psychomotor Activity: Normal   Assets  Assets: Desire for Improvement; Resilience   Sleep  Sleep:Sleep: Good    Physical Exam: Physical Exam Vitals and nursing note reviewed.  Constitutional:      General: He is not in acute distress.    Appearance: Normal appearance. He is normal weight. He is not ill-appearing or toxic-appearing.  HENT:     Head: Normocephalic and atraumatic.  Pulmonary:     Effort: Pulmonary effort is normal.  Musculoskeletal:        General: Normal range of motion.  Neurological:     General: No focal deficit present.     Mental Status: He is alert.    Review of Systems  Respiratory:  Negative for cough and shortness of breath.   Cardiovascular:  Negative for chest pain.  Gastrointestinal:  Negative for abdominal pain, constipation, diarrhea, nausea and vomiting.  Neurological:  Negative for dizziness, weakness and headaches.  Psychiatric/Behavioral:  Negative for depression, hallucinations and suicidal ideas. The patient is not nervous/anxious.    Blood pressure 130/81, pulse 85, temperature 97.9 F (36.6 C), temperature source Oral, resp. rate 20, height 5' 9 (1.753 m), weight 73.5 kg, SpO2 98%. Body mass index is 23.92 kg/m.   Treatment Plan Summary: Daily contact with patient to assess and evaluate symptoms and progress in treatment and Medication  management  [name] [name] is a 49 yr old male who presented on 1/10 to [facility] with SI w/ a plan (jump off bridge) and command hallucinations in the setting of EtOH Abuse and not taking medications for 2 months, he was admitted to [facility] on 1/11.  PPHx is significant for Schizoaffective Disorder, Polysubstance Abuse (EtOH, Meth Cocaine, Opioids), and Multiple Prior Psychiatric Hospitalizations (last [facility] 07/2024), and no history of Suicide Attempts.  PMHx is significant for Withdrawal Seizures.   Kraig is tolerating detox well and is not reporting any withdrawal symptoms.  We will continue with the Ativan  taper given his history of Withdrawal Seizures.  We will not make any changes to his medications at this time.  We will continue to monitor.     DSM-5 diagnoses: Schizoaffective Disorder, depressed type  Alcohol  use disorder, severe, dependence Stimulant use disorder in sustained remission        Plan:   Legal Status: -Voluntary - released from IVC and will sign in voluntarily    Safety -q15 minute checks  -elopement, suicide and assault precautions  -daily vitals   Psychiatric Concerns  -Continue Seroquel  50 mg BID for psychosis and mood stability -Continue Seroquel  100  mg QHS for psychosis and mood stability -Continue Gabapentin  100 mg BID for anxiety and EtOH Abuse -Continue Agitation Protocol: Haldol /Ativan /Benadryl  -Continue PRN's: Tylenol , Maalox, Atarax , Milk of Magnesia, Trazodone     Substance use concerns  Severe alcohol  use: -Continue CIWA, last score= 2  @ 0600  1/13 -Continue Ativan  taper to end on 1/15 -Continue Ativan  1 mg q6 PRN CIWA>10 -Continue Imodium  2-4 mg PRN diarrhea -Continue Zofran -ODT 4 mg q6 PRN nausea -Continue Thiamine  100 mg daily for nutritional supplementation -Continue Multivitamin daily for nutritional supplementation  -exposure to motivational interviewing and encouragement to pursue rehab or SAIOP   Nicotine  Replacement  N/A - does not use  tobacco products    Medical concerns -Per chart review, hx of hepatitis C; currently no elevations in LFTs.      Labs 1/10 -CBC and CMP WNL (LFTs no longer elevated), UDS negative, BAL negative.  1/11 -A1c of 5.7. lipid panel grossly WNL and TSH WNL.    Psychosocial interventions  -Motivational interviewing  -daily medication management with psychiatry -Medication education regarding risks/benefits and alternatives -bedside psychotherapy as indicated  -Patient will be encouraged to participate and engage with group therapy  -Appreciate SW assistance in coordinating safe disposition  -Anticipated LOS 7 day      I certify that inpatient services furnished can reasonably be expected to improve the patient's condition.      Marsa GORMAN Rosser, DO 11/03/2024, 3:45 PM

## 2024-11-02 NOTE — BHH Group Notes (Signed)
 Adult Psychoeducational Group Note  Date:  11/02/2024 Time:  1:20 PM  Group Topic/Focus:  Goals Group:   The focus of this group is to help patients establish daily goals to achieve during treatment and discuss how the patient can incorporate goal setting into their daily lives to aide in recovery. Orientation:   The focus of this group is to educate the patient on the purpose and policies of crisis stabilization and provide a format to answer questions about their admission.  The group details unit policies and expectations of patients while admitted.  Participation Level:  Did Not Attend  Participation Quality:    Affect:    Cognitive:    Insight:   Engagement in Group:    Modes of Intervention:    Additional Comments:    Rakhi Romagnoli O 11/02/2024, 1:20 PM

## 2024-11-02 NOTE — BHH Group Notes (Signed)
 Adult Psychoeducational Group Note  Date:  11/02/2024 Time:  1:20 PM  Group Topic/Focus: Recreation Therapy  Participation Level:  Did Not Attend  Participation Quality:    Affect:    Cognitive:    Insight:   Engagement in Group:    Modes of Intervention:    Additional Comments:    [name] 11/02/2024, 1:20 PM

## 2024-11-02 NOTE — Plan of Care (Signed)
   Problem: Education: Goal: Emotional status will improve Outcome: Progressing Goal: Mental status will improve Outcome: Progressing   Problem: Activity: Goal: Interest or engagement in activities will improve Outcome: Progressing Goal: Sleeping patterns will improve Outcome: Progressing
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
## 2024-11-02 NOTE — B[facility] Group Notes (Signed)
 Adult Psychoeducational Group Note  Date:  11/02/2024 Time:  1:33 PM  Group Topic/Focus: Social Work Group   Participation Level:  Did Not Attend   [name] [name] 11/02/2024, 1:33 PM

## 2024-11-02 NOTE — BH IP Treatment Plan (Signed)
 Interdisciplinary Treatment and Diagnostic Plan Update  11/02/2024 Time of Session: 10:25 am [name] [name] MRN: [id number]  Principal Diagnosis: Schizoaffective disorder, depressive type [facility])  Secondary Diagnoses: Principal Problem:   Schizoaffective disorder, depressive type (HCC) Active Problems:   Alcohol  use disorder, severe, dependence (HCC)   Stimulant use disorder, severe, sustained remission   Current Medications:  Current Facility-Administered Medications  Medication Dose Route Frequency Provider Last Rate Last Admin   acetaminophen  (TYLENOL ) tablet 650 mg  650 mg Oral Q6H PRN White, Patrice L, NP       alum & mag hydroxide-simeth (MAALOX/MYLANTA) 200-200-20 MG/5ML suspension 30 mL  30 mL Oral Q4H PRN White, Patrice L, NP       haloperidol  lactate (HALDOL ) injection 5 mg  5 mg Intramuscular TID PRN White, Patrice L, NP       And   diphenhydrAMINE  (BENADRYL ) injection 50 mg  50 mg Intramuscular TID PRN White, Patrice L, NP       And   LORazepam  (ATIVAN ) injection 2 mg  2 mg Intramuscular TID PRN White, Patrice L, NP       haloperidol  lactate (HALDOL ) injection 10 mg  10 mg Intramuscular TID PRN White, Patrice L, NP       And   diphenhydrAMINE  (BENADRYL ) injection 50 mg  50 mg Intramuscular TID PRN White, Patrice L, NP       And   LORazepam  (ATIVAN ) injection 2 mg  2 mg Intramuscular TID PRN White, Patrice L, NP       folic acid  (FOLVITE ) tablet 1 mg  1 mg Oral Daily White, Patrice L, NP   1 mg at 11/02/24 0756   gabapentin  (NEURONTIN ) capsule 100 mg  100 mg Oral BID White, Patrice L, NP   100 mg at 11/02/24 0756   hydrOXYzine  (ATARAX ) tablet 25 mg  25 mg Oral TID PRN White, Patrice L, NP   25 mg at 10/31/24 2217   loperamide  (IMODIUM ) capsule 2-4 mg  2-4 mg Oral PRN White, Patrice L, NP       LORazepam  (ATIVAN ) tablet 1 mg  1 mg Oral Q6H PRN White, Patrice L, NP       LORazepam  (ATIVAN ) tablet 1 mg  1 mg Oral TID White, Patrice L, NP   1 mg at 11/02/24 1254   Followed by    NOREEN ON 11/03/2024] LORazepam  (ATIVAN ) tablet 1 mg  1 mg Oral BID White, Patrice L, NP       Followed by   NOREEN ON 11/05/2024] LORazepam  (ATIVAN ) tablet 1 mg  1 mg Oral Daily White, Patrice L, NP       magnesium  hydroxide (MILK OF MAGNESIA) suspension 30 mL  30 mL Oral Daily PRN White, Patrice L, NP       multivitamin with minerals tablet 1 tablet  1 tablet Oral Daily White, Patrice L, NP   1 tablet at 11/02/24 0756   nicotine  (NICODERM CQ  - dosed in mg/24 hours) patch 21 mg  21 mg Transdermal Daily Prentis Kitchens A, DO   21 mg at 11/02/24 0755   OLANZapine  (ZYPREXA ) tablet 5 mg  5 mg Oral Q6H PRN Towana Leita SAILOR, MD       ondansetron  (ZOFRAN -ODT) disintegrating tablet 4 mg  4 mg Oral Q6H PRN White, Patrice L, NP       QUEtiapine  (SEROQUEL ) tablet 100 mg  100 mg Oral QHS White, Patrice L, NP   100 mg at 11/01/24 2117   QUEtiapine  (SEROQUEL ) tablet 50  mg  50 mg Oral [id number] Towana Leita SAILOR, MD       thiamine  (Vitamin B-1) tablet 100 mg  100 mg Oral Daily White, Patrice L, NP   100 mg at 11/02/24 0756   PTA Medications: Medications Prior to Admission  Medication Sig Dispense Refill Last Dose/Taking   gabapentin  (NEURONTIN ) 300 MG capsule Take 1 capsule (300 mg total) by mouth 3 (three) times daily. (Patient not taking: Reported on 11/01/2024) 90 capsule 0 Not Taking   hydrOXYzine  (ATARAX ) 25 MG tablet Take 1 tablet (25 mg total) by mouth 3 (three) times daily as needed for anxiety. (Patient not taking: Reported on 11/01/2024) 30 tablet 0 Not Taking   Multiple Vitamin (MULTIVITAMIN WITH MINERALS) TABS tablet Take 1 tablet by mouth daily. (Patient not taking: Reported on 11/01/2024)   Not Taking   nicotine  (NICODERM CQ  - DOSED IN MG/24 HOURS) 14 mg/24hr patch Place 1 patch (14 mg total) onto the skin daily. (Patient not taking: Reported on 11/01/2024)   Not Taking   QUEtiapine  (SEROQUEL ) 100 MG tablet Take 1 tablet (100mg ) by mouth 2 times daily at 8 AM and 3 PM (Patient not taking: Reported on  11/01/2024) 60 tablet 0 Not Taking   QUEtiapine  (SEROQUEL ) 200 MG tablet Take 1 tablet (200 mg total) by mouth at bedtime. (Patient not taking: Reported on 11/01/2024) 30 tablet 0 Not Taking   thiamine  (VITAMIN B-1) 100 MG tablet Take 1 tablet (100 mg total) by mouth daily. (Patient not taking: Reported on 11/01/2024)   Not Taking    Patient Stressors: Financial difficulties   Health problems   Marital or family conflict   Medication change or noncompliance   Occupational concerns   Substance abuse    Patient Strengths: Motivation for treatment/growth   Treatment Modalities: Medication Management, Group therapy, Case management,  1 to 1 session with clinician, Psychoeducation, Recreational therapy.   Physician Treatment Plan for Primary Diagnosis: Schizoaffective disorder, depressive type (HCC) Long Term Goal(s):     Short Term Goals:    Medication Management: Evaluate patient's response, side effects, and tolerance of medication regimen.  Therapeutic Interventions: 1 to 1 sessions, Unit Group sessions and Medication administration.  Evaluation of Outcomes: Not Progressing  Physician Treatment Plan for Secondary Diagnosis: Principal Problem:   Schizoaffective disorder, depressive type (HCC) Active Problems:   Alcohol  use disorder, severe, dependence (HCC)   Stimulant use disorder, severe, sustained remission  Long Term Goal(s):     Short Term Goals:       Medication Management: Evaluate patient's response, side effects, and tolerance of medication regimen.  Therapeutic Interventions: 1 to 1 sessions, Unit Group sessions and Medication administration.  Evaluation of Outcomes: Not Progressing   RN Treatment Plan for Primary Diagnosis: Schizoaffective disorder, depressive type (HCC) Long Term Goal(s): Knowledge of disease and therapeutic regimen to maintain health will improve  Short Term Goals: Ability to remain free from injury will improve, Ability to verbalize  frustration and anger appropriately will improve, Ability to demonstrate self-control, Ability to participate in decision making will improve, Ability to verbalize feelings will improve, Ability to disclose and discuss suicidal ideas, Ability to identify and develop effective coping behaviors will improve, and Compliance with prescribed medications will improve  Medication Management: RN will administer medications as ordered by provider, will assess and evaluate patient's response and provide education to patient for prescribed medication. RN will report any adverse and/or side effects to prescribing provider.  Therapeutic Interventions: 1 on 1 counseling sessions, Psychoeducation, Medication administration,  Evaluate responses to treatment, Monitor vital signs and CBGs as ordered, Perform/monitor CIWA, COWS, AIMS and Fall Risk screenings as ordered, Perform wound care treatments as ordered.  Evaluation of Outcomes: Not Progressing   LCSW Treatment Plan for Primary Diagnosis: Schizoaffective disorder, depressive type (HCC) Long Term Goal(s): Safe transition to appropriate next level of care at discharge, Engage patient in therapeutic group addressing interpersonal concerns.  Short Term Goals: Engage patient in aftercare planning with referrals and resources, Increase social support, Increase ability to appropriately verbalize feelings, Increase emotional regulation, Facilitate acceptance of mental health diagnosis and concerns, Facilitate patient progression through stages of change regarding substance use diagnoses and concerns, Identify triggers associated with mental health/substance abuse issues, and Increase skills for wellness and recovery  Therapeutic Interventions: Assess for all discharge needs, 1 to 1 time with Social worker, Explore available resources and support systems, Assess for adequacy in community support network, Educate family and significant other(s) on suicide prevention,  Complete Psychosocial Assessment, Interpersonal group therapy.  Evaluation of Outcomes: Not Progressing   Progress in Treatment: Attending groups: No. Participating in groups: No. Taking medication as prescribed: Yes. Toleration medication: Yes. Family/Significant other contact made: No, will contact:  declined consents. Patient understands diagnosis: Yes. Discussing patient identified problems/goals with staff: Yes. Medical problems stabilized or resolved: Yes. Denies suicidal/homicidal ideation: Yes. Issues/concerns per patient self-inventory: No. None reported.  New problem(s) identified: No, Describe:  None identified.  New Short Term/Long Term Goal(s): detox, medication management for mood stabilization; elimination of SI thoughts; development of comprehensive mental wellness/sobriety plan  Patient Goals:  Get stabilized on medications, detox from alcohol :  Discharge Plan or Barriers: Patient recently admitted. CSW will continue to follow and assess for appropriate referrals and possible discharge planning.    Reason for Continuation of Hospitalization: Hallucinations Medication stabilization Withdrawal symptoms  Estimated Length of Stay: 5-7 days.  Last 3 [place] Suicide Severity Risk Score: Flowsheet Row [facility] Most recent reading at 10/31/2024 10:46 PM ED from 10/31/2024 in [facility] Most recent reading at 10/31/2024  2:06 PM Admission (Discharged) [facility] Most recent reading at 08/04/2024  6:14 PM  C-SSRS RISK CATEGORY Low Risk No Risk High Risk    Last PHQ 2/9 Scores:    10/05/2021    2:29 AM 09/21/2021   12:04 PM 07/01/2017    8:05 AM  Depression screen PHQ 2/9  Decreased Interest 2 2 3   Down, Depressed, Hopeless 2 3 3   PHQ - 2 Score 4 5 6   Altered sleeping 0 3 2  Tired, decreased  energy 0 2 2  Change in appetite 0 3 3  Feeling bad or failure about yourself  2 2 2   Trouble concentrating 0 2 2  Moving slowly or fidgety/restless 1 2 2   Suicidal thoughts 2 3   PHQ-9 Score 9  22  19    Difficult doing work/chores Extremely dIfficult  Not difficult at all     Data saved with a previous flowsheet row definition    Scribe for Treatment Team: [name]  Nunez-Uva, LCSWA 11/02/2024 3:10 PM

## 2024-11-02 NOTE — Progress Notes (Signed)
(  Sleep Hours) - 8 (Any PRNs that were needed, meds refused, or side effects to meds)- none (Any disturbances and when (visitation, over night)- n/a (Concerns raised by the patient)- none (SI/HI/AVH)- Denies

## 2024-11-02 NOTE — Group Note (Signed)
 LCSW Group Therapy Note   Group Date: 11/02/2024 Start Time: 1300 End Time: 1400   Participation:  did not attend  Type of Therapy and Topic:  Group Therapy  Topic:  Understanding Your Path to Change.  Objective:  The goal is to help individuals understand the stages of change, identify where they currently are in the process, and provide actionable next steps to continue moving forward in their journey of change.  Goals: - Learn about the six stages of change:  Precontemplation, Contemplation, Preparation, Action, Maintenance, and Relapse - Reflect on Current Change Efforts:  Recognize which stage participants are in regarding a personal change. - Plan Next Steps for Moving Forward:  Create an action plan based on their current stage of change.  Class Summary:  In this session, we explored the Stages of Change as a framework to understand the process of change.  We discussed how each stage helps individuals recognize where they are in their personal journey and used the Stages of Change Worksheet for self-reflection. Participants answered questions to better understand their current stage, challenges, and progress. We also emphasized the importance of moving forward, even if setbacks (Relapse) occur, and created actionable steps to help participants continue progressing. By the end of the session, participants gained a clearer understanding of their path to change and left with a clear plan for next steps.  Therapeutic Modalities:  Elements of CBT (cognitive restructuring, problem solving)  Element of DBT (mindfulness, distress tolerance)   [name] [name] [name] [name], LCSWA 11/02/2024  2:14 PM

## 2024-11-02 NOTE — Progress Notes (Signed)
" °   11/02/24 0900  Psych Admission Type (Psych Patients Only)  Admission Status Involuntary  Psychosocial Assessment  Patient Complaints Anxiety  Eye Contact Brief  Facial Expression Anxious  Affect Anxious;Preoccupied  Speech Logical/coherent  Interaction Assertive  Motor Activity Slow  Appearance/Hygiene Unremarkable  Behavior Characteristics Cooperative;Anxious  Mood Preoccupied  Thought Process  Coherency WDL  Content WDL  Delusions None reported or observed  Perception WDL  Hallucination None reported or observed  Judgment Poor  Confusion Mild  Danger to Self  Current suicidal ideation? Denies  Agreement Not to Harm Self Yes  Description of Agreement verbal  Danger to Others  Danger to Others None reported or observed    "

## 2024-11-02 NOTE — Progress Notes (Signed)
(  Sleep Hours) -6.5  (Any PRNs that were needed, meds refused, or side effects to meds)- none  (Any disturbances and when (visitation, over night)-none  (Concerns raised by the patient)- none  (SI/HI/AVH)-AH

## 2024-11-02 NOTE — Plan of Care (Signed)
   Problem: Education: Goal: Emotional status will improve Outcome: Progressing Goal: Mental status will improve Outcome: Progressing Goal: Verbalization of understanding the information provided will improve Outcome: Progressing

## 2024-11-02 NOTE — Group Note (Unsigned)
 Date:  11/02/2024 Time:  8:32 PM  Group Topic/Focus:  Wrap-Up Group:   The focus of this group is to help patients review their daily goal of treatment and discuss progress on daily workbooks.     Participation Level:  {BHH PARTICIPATION OZCZO:77735}  Participation Quality:  {BHH PARTICIPATION QUALITY:22265}  Affect:  {BHH AFFECT:22266}  Cognitive:  {BHH COGNITIVE:22267}  Insight: {BHH Insight2:20797}  Engagement in Group:  {BHH ENGAGEMENT IN HMNLE:77731}  Modes of Intervention:  {BHH MODES OF INTERVENTION:22269}  Additional Comments:  ***  Gwenn Nobie [place] 11/02/2024, 8:32 PM

## 2024-11-02 NOTE — Group Note (Signed)
 Date:  11/02/2024 Time:  8:42 PM  Group Topic/Focus:  Wrap-Up Group:   The focus of this group is to help patients review their daily goal of treatment and discuss progress on daily workbooks.    Participation Level:  Did Not Attend   Ginia Rudell Dacosta 11/02/2024, 8:42 PM

## 2024-11-03 NOTE — Group Note (Signed)
 Date:  11/03/2024 Time:  8:39 PM  Group Topic/Focus:  Wrap-Up Group:   The focus of this group is to help patients review their daily goal of treatment and discuss progress on daily workbooks.    Participation Level:  Did Not Attend  [name] 11/03/2024, 8:39 PM

## 2024-11-03 NOTE — Group Note (Signed)
 Recreation Therapy Group Note   Group Topic:Problem Solving  Group Date: 11/03/2024 Start Time: 1049 End Time: 1112 Facilitators: Baileigh Modisette-McCall, LRT,CTRS Location: 500 [address] Dayroom   Group Topic: Communication, Team Building, Problem Solving  Goal Area(s) Addresses:  Patient will effectively work with peer towards shared goal.  Patient will identify skills used to make activity successful.  Patient will identify how skills used during activity can be used to reach post d/c goals.   Behavioral Response:    Intervention: Teambuilding Activity  Activity: Lily Pad. Working in teams, patients were asked to use colored discs to get the entire team from one end of the hall way to the other. Patients were only allowed to move down and back the hallway by stepping on the discs, patient teams were provided 1 additional disc to assist with them completing task.    Education: Pharmacist, Community, Scientist, Physiological, Discharge Planning   Education Outcome: Acknowledges education.    Affect/Mood: N/A   Participation Level: Did not attend    Clinical Observations/Individualized Feedback:      Plan: Continue to engage patient in RT group sessions 2-3x/week.   Felton Buczynski-McCall, LRT,CTRS 11/03/2024 1:06 PM

## 2024-11-03 NOTE — Progress Notes (Signed)
(  Sleep Hours) -6.5  (Any PRNs that were needed, meds refused, or side effects to meds)- Vistaril  and Zyprexa   per [facility]  (Any disturbances and when (visitation, over night)-none  (Concerns raised by the patient)- none , pt woke up having AH and given PRN per  [facility]  (SI/HI/AVH)-AH

## 2024-11-03 NOTE — BHH Group Notes (Deleted)
 Adult Psychoeducational Group Note  Date:  11/03/2024 Time:  4:00 PM  Group Topic/Focus: Emotional Wellness  Participation Level:  Did Not Attend  Participation Quality:    Affect:    Cognitive:    Insight:   Engagement in Group:    Modes of Intervention:    Additional Comments:   Christella App O 11/03/2024, 4:00 PM

## 2024-11-03 NOTE — BHH Group Notes (Signed)
 Adult Psychoeducational Group Note  Date:  11/03/2024 Time:  3:59 PM  Group Topic/Focus: Emotional Wellness  Participation Level:  Did Not Attend  Participation Quality:    Affect:    Cognitive:    Insight:   Engagement in Group:    Modes of Intervention:    Additional Comments:    Elizbeth Posa O 11/03/2024, 3:59 PM

## 2024-11-03 NOTE — BHH Group Notes (Signed)
 Adult Psychoeducational Group Note  Date:  11/03/2024 Time:  9:42 AM  Group Topic/Focus:  Goals Group:   The focus of this group is to help patients establish daily goals to achieve during treatment and discuss how the patient can incorporate goal setting into their daily lives to aide in recovery. Orientation:   The focus of this group is to educate the patient on the purpose and policies of crisis stabilization and provide a format to answer questions about their admission.  The group details unit policies and expectations of patients while admitted.  Participation Level:  Did Not Attend  Participation Quality:    Affect:    Cognitive:    Insight:   Engagement in Group:    Modes of Intervention:    Additional Comments:    Destynee Stringfellow O 11/03/2024, 9:42 AM

## 2024-11-03 NOTE — Plan of Care (Signed)

## 2024-11-03 NOTE — B[facility] Group Notes (Signed)
 Adult Psychoeducational Group Note  Date:  11/03/2024 Time:  11:33 AM  Group Topic/Focus: Recreation Therapy  Participation Level:  Did Not Attend  Participation Quality:    Affect:    Cognitive:    Insight:   Engagement in Group:    Modes of Intervention:    Additional Comments:    Jamelle Cassondra KIDD 11/03/2024, 11:33 AM

## 2024-11-03 NOTE — BHH Group Notes (Signed)
 Adult Psychoeducational Group Note  Date:  11/03/2024 Time:  4:01 PM  Group Topic/Focus: Pharmacy Group  Participation Level:  Did Not Attend  Participation Quality:    Affect:    Cognitive:    Insight:   Engagement in Group:    Modes of Intervention:    Additional Comments:    [name] [name] O 11/03/2024, 4:01 PM

## 2024-11-03 NOTE — Progress Notes (Signed)
 Recreation Therapy Notes  Patient admitted to unit 1.10.26. Due to admission within last year, no new recreation therapy assessment conducted at this time. Last assessment conducted on 10.6.25.    Reason for current admission per patient, drinking.  Patient reports housing as current stressor.  Patient identified strengths as God.  Patient reports goal of be discharged as soon as possible.  Patient denies SI, HI, AVH at this time.    Information found below from assessment conducted 10.6.25.  Leisure Interests: Cards, Listening to music  Community Resources: Park, Public Affairs Consultant  Areas of Improvement: None     [name] [name], LRT,CTRS [name] [name] [name] [name] 11/03/2024 1:47 PM

## 2024-11-03 NOTE — Plan of Care (Addendum)
 Pt noted to be guarded, fidgety /restless; slowing pacing hall at intervals. Denies SI, HI, AVH and pain when assessed I heard voices & I was seeing stuff but that's couple days ago. Tolerates meals, fluids and medications well this shift. Safety checks maintained at Q 15 minutes intervals without incident.   Problem: Health Behavior/Discharge Planning: Goal: Compliance with treatment plan for underlying cause of condition will improve Outcome: Progressing   Problem: Activity: Goal: Interest or engagement in activities will improve Outcome: Not Progressing

## 2024-11-04 MED ORDER — QUETIAPINE FUMARATE 200 MG PO TABS
200.0000 mg | ORAL_TABLET | Freq: Every day | ORAL | Status: DC
  Administered 2024-11-04: 200 mg via ORAL
  Filled 2024-11-04: qty 1

## 2024-11-04 NOTE — Plan of Care (Signed)
   Problem: Education: Goal: Emotional status will improve Outcome: Not Progressing Goal: Mental status will improve Outcome: Not Progressing

## 2024-11-04 NOTE — Group Note (Signed)
 Recreation Therapy Group Note   Group Topic:Self-Esteem  Group Date: 11/04/2024 Start Time: 1032 End Time: 1112 Facilitators: Rakayla Ricklefs-McCall, LRT,CTRS Location: [address] Dayroom   Group Topic: Self-esteem  Goal Area(s) Addresses:  Patient will identify and write at least one positive trait about themself. Patient will successfully identify influential people in their life and why they admire them. Patient will acknowledge the benefit of healthy self-esteem. Patient will endorse understanding of ways to increase self-esteem.   Behavioral Response:    Intervention: Personalized Plate- printed license plate template, markers or colored pencils   Activity: LRT began group session with open dialogue asking the patients to define self-esteem and verbally identify positive qualities and traits people may possess. Patients were then instructed to design a personalized license plate, with words and drawings, representing at least 3 positive things about themselves. Pts were encouraged to include favorites, things they are proud of, what they enjoy doing, and dreams for their future. If a patient had a life motto or a meaningful phase that expressed their life values, pt's were asked to incorporate that into their design as well. Patients were given the opportunity to share their completed work with the group.  Education: Healthy self-esteem, Positive character traits, Accepting compliments, Leisure as competence and coping, Support Systems, Discharge planning LRT educated patients on the importance of healthy self-esteem and ways to build self-esteem. LRT addressed discharge planning reviewing positive coping skills and healthy support systems.  Education Outcome: Acknowledges education/In group clarification offered    Affect/Mood: N/A   Participation Level: Did not attend    Clinical Observations/Individualized Feedback:      Plan: Continue to engage patient in RT group  sessions 2-3x/week.   [name] [name], LRT,CTRS 11/04/2024 1:31 PM

## 2024-11-04 NOTE — Progress Notes (Signed)
(  Sleep Hours) -4.75  (Any PRNs that were needed, meds refused, or side effects to meds)- Vistaril  25 mg  (Any disturbances and when (visitation, over night)-none  (Concerns raised by the patient)- none  (SI/HI/AVH)- AH

## 2024-11-04 NOTE — BHH Group Notes (Signed)
 Adult Psychoeducational Group Note  Date:  11/04/2024 Time:  9:54 AM  Group Topic/Focus:  Goals Group:   The focus of this group is to help patients establish daily goals to achieve during treatment and discuss how the patient can incorporate goal setting into their daily lives to aide in recovery. Orientation:   The focus of this group is to educate the patient on the purpose and policies of crisis stabilization and provide a format to answer questions about their admission.  The group details unit policies and expectations of patients while admitted.  Participation Level:  Did Not Attend  Participation Quality:    Affect:    Cognitive:    Insight:   Engagement in Group:    Modes of Intervention:    Additional Comments:    [name] [name] 11/04/2024, 9:54 AM

## 2024-11-04 NOTE — Progress Notes (Signed)
 [name] [name]  Type of Note: Update on Residential Treatment Opportunities  CSW provided list of sober living treatment facilities in the area.  Pt stated that he wanted to return to the [facility] on [address]; reported that he has an interview tomorrow (Thursday) at 7 pm with the owner.  CSW indicated that he would have to verify with the [facility] that he has an interview, he stated not for this CSW to call or verify and to just trust me.  I have an interview.  He also stated he would want to go to the [facility] for a night or two before going to the [facility].  CSW stated that he would indeed have to verify with the [facility] that he was accepted.  He became somewhat irritated/agitated; CSW discontinued the conversation.  Signed: Arlin Savona, LCSW 11/04/2024 9:35 AM

## 2024-11-04 NOTE — Group Note (Signed)
 Date:  11/04/2024 Time:  8:18 PM  Group Topic/Focus:  Wrap-Up Group:   The focus of this group is to help patients review their daily goal of treatment and discuss progress on daily workbooks.    Additional Comments:  Pt was encouraged to attend group however, the patient chose not to attend.  [name] [name] [place] 11/04/2024, 8:18 PM

## 2024-11-04 NOTE — Progress Notes (Signed)
 [facility] MD Progress Note  11/04/2024 11:38 AM [name] [name]  MRN:  [id number] Subjective:   [name] [name] is a 49 yr old male who presented on 1/10 to [facility] with SI w/ a plan (jump off bridge) and command hallucinations in the setting of EtOH Abuse and not taking medications for 2 months, he was admitted to [facility] on 1/11.  PPHx is significant for Schizoaffective Disorder, Polysubstance Abuse (EtOH, Meth Cocaine, Opioids), and Multiple Prior Psychiatric Hospitalizations (last [facility] 07/2024), and no history of Suicide Attempts.  PMHx is significant for Withdrawal Seizures.   Case was discussed in the multidisciplinary team. MAR was reviewed and patient was compliant with medications.  He received PRN Hydroxyzine  yesterday.  He received PRN Agitation Zyprexa  last night.   Psychiatric Team made the following recommendations yesterday: -Continue Seroquel  50 mg BID for psychosis and mood stability -Continue Seroquel  100 mg QHS for psychosis and mood stability -Continue Gabapentin  100 mg BID for anxiety and EtOH Abuse   On interview today patient reports he slept good last night.  He reports his appetite is doing good.  He reports no SI, HI, or AVH.  He reports no Paranoia or Ideas of Reference.  He reports no issues with his medications.  He reports that he wants to go home.  Discussed with him that we would plan to complete his Ativan  taper before we could discharge given his history of withdrawal seizures and he reports understanding and agreement.  He reports that he plans to go back to his [place] house.  He reports no withdrawal symptoms.  He reports no other concerns at present.   Principal Problem: Schizoaffective disorder, depressive type (HCC) Diagnosis: Principal Problem:   Schizoaffective disorder, depressive type (HCC) Active Problems:   Alcohol  use disorder, severe, dependence (HCC)   Stimulant use disorder, severe, sustained remission  Total Time spent with patient:  I personally spent 35  minutes on the unit in direct patient care. The direct patient care time included face-to-face time with the patient, reviewing the patient's chart, communicating with other professionals, and coordinating care.    Past Psychiatric History: Schizoaffective Disorder, Polysubstance Abuse (EtOH, Meth Cocaine, Opioids), and Multiple Prior Psychiatric Hospitalizations (last [facility] 07/2024), and no history of Suicide Attempts.  Past Medical History:  Past Medical History:  Diagnosis Date   Bipolar disorder (HCC)    Cellulitis    Depression    Drug abuse (HCC)    Hepatitis C     Past Surgical History:  Procedure Laterality Date   BACK SURGERY     ROTATOR CUFF REPAIR     SHOULDER SURGERY     Family History:  Family History  Problem Relation Age of Onset   Alcohol  abuse Father    Cancer Maternal Aunt    Family Psychiatric  History:  Mother- Anxiety Father- EtOH Abuse  Social History:  Social History   Substance and Sexual Activity  Alcohol  Use Yes     Social History   Substance and Sexual Activity  Drug Use Not Currently   Types: Marijuana, Benzodiazepines, Other-see comments, Methamphetamines, Cocaine    Social History   Socioeconomic History   Marital status: Divorced    Spouse name: Not on file   Number of children: 2   Years of education: Not on file   Highest education level: GED or equivalent  Occupational History   Not on file  Tobacco Use   Smoking status: Every Day    Current packs/day: 1.00  Average packs/day: 1 pack/day for 43.0 years (43.0 ttl pk-yrs)    Types: Cigarettes    Start date: 10/22/1981   Smokeless tobacco: Never  Vaping Use   Vaping status: Never Used  Substance and Sexual Activity   Alcohol  use: Yes   Drug use: Not Currently    Types: Marijuana, Benzodiazepines, Other-see comments, Methamphetamines, Cocaine   Sexual activity: Not Currently  Other Topics Concern   Not on file  Social History Narrative   Not on file   Social Drivers of  Health   Tobacco Use: High Risk (10/31/2024)   Patient History    Smoking Tobacco Use: Every Day    Smokeless Tobacco Use: Never    Passive Exposure: Not on file  Financial Resource Strain: High Risk (04/10/2023)   Received from [facility]   Overall Financial Resource Strain (CARDIA)    Difficulty of Paying Living Expenses: Hard  Food Insecurity: Food Insecurity Present (10/31/2024)   Epic    Worried About Programme Researcher, Broadcasting/film/video in the Last Year: Often true    Barista in the Last Year: Often true  Transportation Needs: Unmet Transportation Needs (10/31/2024)   Epic    Lack of Transportation (Medical): Yes    Lack of Transportation (Non-Medical): Yes  Physical Activity: Inactive (04/10/2023)   Received from [facility]   Exercise Vital Sign    On average, how many days per week do you engage in moderate to strenuous exercise (like a brisk walk)?: 0 days    On average, how many minutes do you engage in exercise at this level?: 0 min  Stress: Stress Concern Present (04/22/2024)   Received from [facility] - Occupational Stress Questionnaire    Feeling of Stress : Rather much  Social Connections: Unknown (10/31/2024)   Social Connection and Isolation Panel    Frequency of Communication with Friends and Family: Patient declined    Frequency of Social Gatherings with Friends and Family: Patient declined    Attends Religious Services: Patient declined    Active Member of Clubs or Organizations: Not on file    Attends Banker Meetings: Patient declined    Marital Status: Patient declined  Depression (PHQ2-9): Not on file  Alcohol  Screen: Low Risk (10/31/2024)   Alcohol  Screen    Last Alcohol  Screening Score (AUDIT): 4  Housing: High Risk (10/31/2024)   Epic    Unable to Pay for Housing in the Last Year: Yes    Number of Times Moved in the Last Year: 8    Homeless in the Last Year: Yes  Utilities:  Patient Declined (10/31/2024)   Epic    Threatened with loss of utilities: Patient declined  Health Literacy: Not on file   Additional Social History:                         Sleep: Good Estimated Sleeping Duration (Last 24 Hours): 5.25-5.75 hours  Appetite:  Good  Current Medications: Current Facility-Administered Medications  Medication Dose Route Frequency Provider Last Rate Last Admin   acetaminophen  (TYLENOL ) tablet 650 mg  650 mg Oral Q6H PRN White, Patrice L, NP   650 mg at 11/02/24 1821   alum & mag hydroxide-simeth (MAALOX/MYLANTA) 200-200-20 MG/5ML suspension 30 mL  30 mL Oral Q4H PRN White, Patrice L, NP       haloperidol  lactate (HALDOL ) injection 5 mg  5 mg Intramuscular  TID PRN White, Patrice L, NP       And   diphenhydrAMINE  (BENADRYL ) injection 50 mg  50 mg Intramuscular TID PRN White, Patrice L, NP       And   LORazepam  (ATIVAN ) injection 2 mg  2 mg Intramuscular TID PRN White, Patrice L, NP       haloperidol  lactate (HALDOL ) injection 10 mg  10 mg Intramuscular TID PRN White, Patrice L, NP       And   diphenhydrAMINE  (BENADRYL ) injection 50 mg  50 mg Intramuscular TID PRN White, Patrice L, NP       And   LORazepam  (ATIVAN ) injection 2 mg  2 mg Intramuscular TID PRN White, Patrice L, NP       folic acid  (FOLVITE ) tablet 1 mg  1 mg Oral Daily White, Patrice L, NP   1 mg at 11/04/24 0817   gabapentin  (NEURONTIN ) capsule 100 mg  100 mg Oral BID White, Patrice L, NP   100 mg at 11/04/24 9183   hydrOXYzine  (ATARAX ) tablet 25 mg  25 mg Oral TID PRN White, Patrice L, NP   25 mg at 11/03/24 2057   [START ON 11/05/2024] LORazepam  (ATIVAN ) tablet 1 mg  1 mg Oral Daily White, Patrice L, NP       magnesium  hydroxide (MILK OF MAGNESIA) suspension 30 mL  30 mL Oral Daily PRN White, Patrice L, NP       multivitamin with minerals tablet 1 tablet  1 tablet Oral Daily White, Patrice L, NP   1 tablet at 11/04/24 0817   nicotine  (NICODERM CQ  - dosed in mg/24 hours) patch 21 mg   21 mg Transdermal Daily Bouchard, Marc A, DO   21 mg at 11/04/24 0816   OLANZapine  (ZYPREXA ) tablet 5 mg  5 mg Oral Q6H PRN Butler, Laura N, MD   5 mg at 11/04/24 9383   QUEtiapine  (SEROQUEL ) tablet 200 mg  200 mg Oral QHS Da Authement S, DO       QUEtiapine  (SEROQUEL ) tablet 50 mg  50 mg Oral [id number] Towana Leita SAILOR, MD   50 mg at 11/04/24 9182   thiamine  (Vitamin B-1) tablet 100 mg  100 mg Oral Daily White, Patrice L, NP   100 mg at 11/04/24 9182    Lab Results:  No results found for this or any previous visit (from the past 48 hours).   Blood Alcohol  level:  Lab Results  Component Value Date   ETH <15 10/31/2024   ETH 87 (H) 08/03/2024    Metabolic Disorder Labs: Lab Results  Component Value Date   HGBA1C 5.7 (H) 11/01/2024   MPG 116.89 11/01/2024   MPG 108.28 09/22/2021   No results found for: PROLACTIN Lab Results  Component Value Date   CHOL 102 11/01/2024   TRIG 164 (H) 11/01/2024   HDL 41 11/01/2024   CHOLHDL 2.5 11/01/2024   VLDL 33 11/01/2024   LDLCALC 29 11/01/2024   LDLCALC 89 10/10/2021    Physical Findings: AIMS:  ,  ,  ,  ,  ,  ,   CIWA:  CIWA-Ar Total: 1 COWS:     Musculoskeletal: Strength & Muscle Tone: within normal limits Gait & Station: normal Patient leans: N/A  Psychiatric Specialty Exam:  Presentation  General Appearance:  Disheveled  Eye Contact: Fair  Speech: Clear and Coherent; Normal Rate  Speech Volume: Normal  Handedness: Right   Mood and Affect  Mood: -- (fine)  Affect: Other (comment) (some constriction,  somewhat guarded)   Thought Process  Thought Processes: Coherent  Descriptions of Associations:Intact  Orientation:Full (Time, Place and Person)  Thought Content:WDL  History of Schizophrenia/Schizoaffective disorder:Yes  Duration of Psychotic Symptoms:Greater than six months  Hallucinations:Hallucinations: -- (Reports None but has had some responding to internal stimuli)  Ideas of  Reference:None  Suicidal Thoughts:Suicidal Thoughts: No  Homicidal Thoughts:Homicidal Thoughts: No   Sensorium  Memory: Immediate Fair; Recent Fair  Judgment: Intact  Insight: Present   Executive Functions  Concentration: Fair  Attention Span: Fair  Recall: Fair  Fund of Knowledge: Fair  Language: Fair   Psychomotor Activity  Psychomotor Activity:Psychomotor Activity: Normal   Assets  Assets: Desire for Improvement; Resilience   Sleep  Sleep:Sleep: Good    Physical Exam: Physical Exam ROS Blood pressure 96/77, pulse 80, temperature 97.9 F (36.6 C), temperature source Oral, resp. rate 20, height 5' 9 (1.753 m), weight 73.5 kg, SpO2 98%. Body mass index is 23.92 kg/m.   Treatment Plan Summary: Daily contact with patient to assess and evaluate symptoms and progress in treatment and Medication management  [name] [name] is a 49 yr old male who presented on 1/10 to [facility] with SI w/ a plan (jump off bridge) and command hallucinations in the setting of EtOH Abuse and not taking medications for 2 months, he was admitted to [facility] on 1/11.  PPHx is significant for Schizoaffective Disorder, Polysubstance Abuse (EtOH, Meth Cocaine, Opioids), and Multiple Prior Psychiatric Hospitalizations (last [facility] 07/2024), and no history of Suicide Attempts.  PMHx is significant for Withdrawal Seizures.   [name] [name] continues to tolerate Detox without any withdrawal symptoms at this time.  He continues to respond to internal stimuli so we will increase his evening Seroquel .  If he continues to do well we will plan for discharge tomorrow.  We will continue to monitor.     DSM-5 diagnoses: Schizoaffective Disorder, depressed type  Alcohol  use disorder, severe, dependence Stimulant use disorder in sustained remission        Plan:   Legal Status: -Voluntary - released from IVC and will sign in voluntarily    Safety -q15 minute checks  -elopement, suicide and assault  precautions  -daily vitals   Psychiatric Concerns  -Continue Seroquel  50 mg BID for psychosis and mood stability -Increase Seroquel  to 200 mg QHS for psychosis and mood stability -Continue Gabapentin  100 mg BID for anxiety and EtOH Abuse -Continue Agitation Protocol: Haldol /Ativan /Benadryl  -Continue PRN's: Tylenol , Maalox, Atarax , Milk of Magnesia, Trazodone     Substance use concerns  Severe alcohol  use: -Continue CIWA, last score= 1  @ 1748  1/13 -Continue Ativan  taper to end on 1/15 -Continue Ativan  1 mg q6 PRN CIWA>10 -Continue Imodium  2-4 mg PRN diarrhea -Continue Zofran -ODT 4 mg q6 PRN nausea -Continue Thiamine  100 mg daily for nutritional supplementation -Continue Multivitamin daily for nutritional supplementation  -exposure to motivational interviewing and encouragement to pursue rehab or SAIOP   Nicotine  Replacement  N/A - does not use tobacco products    Medical concerns -Per chart review, hx of hepatitis C; currently no elevations in LFTs.      Labs 1/10 -CBC and CMP WNL (LFTs no longer elevated), UDS negative, BAL negative.  1/11 -A1c of 5.7. lipid panel grossly WNL and TSH WNL.    Psychosocial interventions  -Motivational interviewing  -daily medication management with psychiatry -Medication education regarding risks/benefits and alternatives -bedside psychotherapy as indicated  -Patient will be encouraged to participate and engage with group therapy  -Appreciate SW assistance in coordinating  safe disposition  -Anticipated LOS 7 day      I certify that inpatient services furnished can reasonably be expected to improve the patient's condition.      Marsa GORMAN Rosser, DO 11/04/2024, 11:38 AM

## 2024-11-04 NOTE — B[facility] Group Notes (Signed)
 Adult Psychoeducational Group Note  Date:  11/04/2024 Time:  4:11 PM  Group Topic/Focus: Physical Wellness  Participation Level:  Did Not Attend  Participation Quality:    Affect:    Cognitive:    Insight:   Engagement in Group:    Modes of Intervention:    Additional Comments:    [name] [name] O 11/04/2024, 4:11 PM

## 2024-11-04 NOTE — Plan of Care (Signed)
   Problem: Education: Goal: Emotional status will improve Outcome: Progressing Goal: Mental status will improve Outcome: Progressing   Problem: Activity: Goal: Interest or engagement in activities will improve Outcome: Progressing Goal: Sleeping patterns will improve Outcome: Progressing   Problem: Safety: Goal: Periods of time without injury will increase Outcome: Progressing

## 2024-11-04 NOTE — BHH Group Notes (Signed)
 Adult Psychoeducational Group Note  Date:  11/04/2024 Time:  4:12 PM  Group Topic/Focus: Emotional Wellness  Participation Level:  Did Not Attend  Participation Quality:    Affect:    Cognitive:    Insight:   Engagement in Group:    Modes of Intervention:    Additional Comments:    Colbey Wirtanen O 11/04/2024, 4:12 PM

## 2024-11-04 NOTE — B[facility] Group Notes (Signed)
 Adult Psychoeducational Group Note  Date:  11/04/2024 Time:  4:11 PM  Group Topic/Focus: Recreation Therapy  Participation Level:  Did Not Attend  Participation Quality:    Affect:    Cognitive:    Insight:   Engagement in Group:    Modes of Intervention:    Additional Comments:    Donja Tipping O 11/04/2024, 4:11 PM

## 2024-11-04 NOTE — Progress Notes (Signed)
 Tour of Duty:  Prentice JINNY Angle, RN, 11/04/2024, Tour of Duty: 0700-1900  SI/HI/AVH: Denies but observed to heavily RTIS in milieu and room  Self-Reported   Mood: Negative  Anxiety: Denies, but Observable Depression: Denies, but Observable Irritability: Denies, but Observable  Broset  Violence Prevention Guidelines *See Row Information*: Moderate Violence Risk interventions implemented   LBM  Last BM Date : 11/03/24   Pain: not present  Patient Refusals (including Rx): No  >>Shift Summary: Patient observed to be withdrawn to self on unit. Patient RTIS heavily in day room and in room, usually accompanied with spastic movement. Patient able to make needs known. Patient observed to engage inappropriately with staff and peers, internally preoccupied. Patient taking medications as prescribed. This shift, no PRN medication requested or required. No reported or observed side effects to medication. No reported or observed agitation, aggression, or other acute emotional distress. No reported or observed physical abnormalities or concerns.  Last Vitals  Vitals Weight: 73.5 kg Temp: 97.9 F (36.6 C) Temp Source: Oral Pulse Rate: (!) 102 Resp: 20 BP: (!) 150/80 Patient Position: (not recorded)  Admission Type  Psych Admission Type (Psych Patients Only) Admission Status: Involuntary Date 72 hour document signed : (not recorded) Time 72 hour document signed : (not recorded) Provider Notified (First and Last Name) (see details for LINK to note): (not recorded)   Psychosocial Assessment  Psychosocial Assessment Patient Complaints: None Eye Contact: Intense, Staring Facial Expression: Animated, Anxious Affect: Anxious, Preoccupied Speech: Other (Comment) (paucity) Interaction: Defensive, Superficial Motor Activity: Restless, Pacing Appearance/Hygiene: Disheveled Behavior Characteristics: Cooperative, Restless, Anxious, Agitated Mood: Suspicious, Preoccupied   Aggressive  Behavior  Targets: (not recorded)   Thought Process  Thought Process Coherency: Blocking, Circumstantial Content: Preoccupation Delusions: None reported or observed Perception: Hallucinations Hallucination: Auditory (RTIS) Judgment: Poor Confusion: Mild  Danger to Self/Others  Danger to Self Current suicidal ideation?: Denies Description of Suicide Plan: (not recorded) Self-Injurious Behavior: (not recorded) Agreement Not to Harm Self: (not recorded) Description of Agreement: (not recorded) Danger to Others: None reported or observed

## 2024-11-05 MED ORDER — NICOTINE 21 MG/24HR TD PT24: 21.0000 mg | MEDICATED_PATCH | Freq: Every day | TRANSDERMAL | 0 refills | Status: AC

## 2024-11-05 MED ORDER — QUETIAPINE FUMARATE 200 MG PO TABS: 200.0000 mg | ORAL_TABLET | Freq: Every day | ORAL | 0 refills | Status: AC

## 2024-11-05 MED ORDER — QUETIAPINE FUMARATE 50 MG PO TABS: 50.0000 mg | ORAL_TABLET | ORAL | 0 refills | Status: AC

## 2024-11-05 MED ORDER — GABAPENTIN 100 MG PO CAPS: 100.0000 mg | ORAL_CAPSULE | Freq: Two times a day (BID) | ORAL | 0 refills | Status: AC

## 2024-11-05 NOTE — Care Management Important Message (Signed)
 Medicare IM printed and given to social work to give to the patient. ?

## 2024-11-05 NOTE — B[facility] Group Notes (Signed)
 Adult Psychoeducational Group Note  Date:  11/05/2024 Time:  10:00 AM  Group Topic/Focus:  Goals Group:   The focus of this group is to help patients establish daily goals to achieve during treatment and discuss how the patient can incorporate goal setting into their daily lives to aide in recovery. Orientation:   The focus of this group is to educate the patient on the purpose and policies of crisis stabilization and provide a format to answer questions about their admission.  The group details unit policies and expectations of patients while admitted.  Participation Level:  Did Not Attend  Participation Quality:    Affect:    Cognitive:    Insight:   Engagement in Group:    Modes of Intervention:    Additional Comments:    [name] O 11/05/2024, 10:00 AM

## 2024-11-05 NOTE — Transportation (Signed)
 11/05/2024  [name] [name] DOB: [birth year] MRN: [id number]   RIDER WAIVER AND RELEASE OF LIABILITY  For the purposes of helping with transportation needs, [facility] partners with outside transportation providers (taxi companies, [place], catering manager.) to give [facility] patients or other approved people the choice of on-demand rides Public Librarian) to our buildings for non-emergency visits.  By using [employer], I, the person signing this document, on behalf of myself and/or any legal minors (in my care using the [employer]), agree:  Science Writer given to me are supplied by independent, outside transportation providers who do not work for, or have any affiliation with, [employer]. [facility] is not a transportation company. [facility] has no control over the quality or safety of the rides I get using [employer]. [facility] has no control over whether any outside ride will happen on time or not. [facility] gives no guarantee on the reliability, quality, safety, or availability on any rides, or that no mistakes will happen. I know and accept that traveling by vehicle (car, truck, SVU, fleeta, bus, taxi, etc.) has risks of serious injuries such as disability, being paralyzed, and death. I know and agree the risk of using [employer] is mine alone, and not [employer]. [employer] are provided as is and as are available. The transportation providers are in charge for all inspections and care of the vehicles used to provide these rides. I agree not to take legal action against , its agents, employees, officers, directors, representatives, insurers, attorneys, assigns, successors, subsidiaries, and affiliates at any time for any reasons related directly or indirectly to using [employer]. I also agree not to take legal action against  or its affiliates for any injury, death, or damage to property caused by or related to using  [employer]. I have read this Waiver and Release of Liability, and I understand the terms used in it and their legal meaning. This Waiver is freely and voluntarily given with the understanding that my right (or any legal minors) to legal action against  relating to [employer] is knowingly given up to use these services.   I attest that I read the Ride Waiver and Release of Liability to [name] [name], gave Mr. Luckett the opportunity to ask questions and answered the questions asked (if any). I affirm that [name] [name] then provided consent for assistance with transportation.

## 2024-11-05 NOTE — [facility] Suicide Risk Assessment (Signed)
 [facility] Discharge Suicide Risk Assessment   Principal Problem: Schizoaffective disorder, depressive type [facility]) Discharge Diagnoses: Principal Problem:   Schizoaffective disorder, depressive type (HCC) Active Problems:   Alcohol  use disorder, severe, dependence (HCC)   Stimulant use disorder, severe, sustained remission  During the patient's hospitalization, patient had extensive initial psychiatric evaluation, and follow-up psychiatric evaluations every day.  Psychiatric diagnoses provided upon initial assessment: Schizoaffective disorder, depressive type (HCC) Active Problems:   Alcohol  use disorder, severe, dependence (HCC)   Stimulant use disorder, severe, sustained remission  Patient's psychiatric medications were adjusted on admission: His evening dose of Seroquel  was increased.  He was placed on an Ativan  taper.  During the hospitalization, other adjustments were made to the patient's psychiatric medication regimen: He had additional doses of Seroquel  during the day started.  Gradually, patient started adjusting to milieu.   Patient's care was discussed during the interdisciplinary team meeting every day during the hospitalization.  The patient is not having side effects to prescribed psychiatric medication.  The patient reports their target psychiatric symptoms of SI and AH responded well to the psychiatric medications, and the patient reports overall benefit other psychiatric hospitalization. Supportive psychotherapy was provided to the patient. The patient also participated in regular group therapy while admitted.   Labs were reviewed with the patient, and abnormal results were discussed with the patient.  The patient denied having suicidal thoughts more than 48 hours prior to discharge.  Patient denies having homicidal thoughts.  Patient denies having auditory hallucinations.  Patient denies any visual hallucinations.  Patient denies having paranoid thoughts.  The patient is  able to verbalize their individual safety plan to this provider.  It is recommended to the patient to continue psychiatric medications as prescribed, after discharge from the hospital.    It is recommended to the patient to follow up with your outpatient psychiatric provider and PCP.  Discussed with the patient, the impact of alcohol , drugs, tobacco have been there overall psychiatric and medical wellbeing, and total abstinence from substance use was recommended the patient.  Total Time spent with patient: 20 minutes  Musculoskeletal: Strength & Muscle Tone: within normal limits Gait & Station: normal Patient leans: N/A  Psychiatric Specialty Exam  Presentation  General Appearance:  Casual  Eye Contact: Fair  Speech: Clear and Coherent; Normal Rate  Speech Volume: Normal  Handedness: Right   Mood and Affect  Mood: -- (fine)  Duration of Depression Symptoms: No data recorded Affect: Congruent (somewhat guarded)   Thought Process  Thought Processes: Coherent  Descriptions of Associations:Intact  Orientation:Full (Time, Place and Person)  Thought Content:WDL  History of Schizophrenia/Schizoaffective disorder:Yes  Duration of Psychotic Symptoms:Greater than six months  Hallucinations:Hallucinations: -- (reports none but responding to internal stimuli (baseline))  Ideas of Reference:None  Suicidal Thoughts:Suicidal Thoughts: No  Homicidal Thoughts:Homicidal Thoughts: No   Sensorium  Memory: Immediate Fair; Recent Fair  Judgment: Fair  Insight: Fair   Art Therapist  Concentration: Fair  Attention Span: Fair  Recall: [employer] of Knowledge: Fair  Language: Fair   Psychomotor Activity  Psychomotor Activity: Psychomotor Activity: Normal   Assets  Assets: Desire for Improvement; Resilience   Sleep  Sleep: Sleep: Good  Estimated Sleeping Duration (Last 24 Hours): 4.00-4.25 hours  Physical Exam: Physical  Exam Vitals and nursing note reviewed.  Constitutional:      General: He is not in acute distress.    Appearance: Normal appearance. He is normal weight. He is not ill-appearing or toxic-appearing.  HENT:  Head: Normocephalic and atraumatic.  Pulmonary:     Effort: Pulmonary effort is normal.  Musculoskeletal:        General: Normal range of motion.  Neurological:     General: No focal deficit present.     Mental Status: He is alert.    Review of Systems  Respiratory:  Negative for cough and shortness of breath.   Cardiovascular:  Negative for chest pain.  Gastrointestinal:  Negative for abdominal pain, constipation, diarrhea, nausea and vomiting.  Neurological:  Negative for dizziness, weakness and headaches.  Psychiatric/Behavioral:  Negative for depression and suicidal ideas. The patient is not nervous/anxious.        Reports no AVH but responding to internal stimuli (baseline)    Blood pressure (!) 131/91, pulse 91, temperature (!) 97.3 F (36.3 C), temperature source Oral, resp. rate 18, height 5' 9 (1.753 m), weight 73.5 kg, SpO2 97%. Body mass index is 23.92 kg/m.  Mental Status Per Nursing Assessment::   On Admission:  NA  Demographic Factors:  Male, Caucasian, Low socioeconomic status, and Unemployed  Loss Factors: Financial problems/change in socioeconomic status  Historical Factors: Family history of mental illness or substance abuse  Risk Reduction Factors:   Religious beliefs about death and Positive coping skills or problem solving skills  Continued Clinical Symptoms:  Alcohol /Substance Abuse/Dependencies More than one psychiatric diagnosis Previous Psychiatric Diagnoses and Treatments Medical Diagnoses and Treatments/Surgeries Schizoaffective Disorder  Cognitive Features That Contribute To Risk:  Loss of executive function    Suicide Risk:  Minimal: No identifiable suicidal ideation.  Patients presenting with no risk factors but with morbid  ruminations; may be classified as minimal risk based on the severity of the depressive symptoms    Plan Of Care/Follow-up recommendations:  Activity: as tolerated  Diet: heart healthy  Other: -Follow-up with your outpatient psychiatric provider -instructions on appointment date, time, and address (location) are provided to you in discharge paperwork.  -Take your psychiatric medications as prescribed at discharge - instructions are provided to you in the discharge paperwork  -Follow-up with outpatient primary care doctor and other specialists -for management of chronic medical disease, including: Routine Care.  Continued Care for Substance Abuse.    -Testing: Follow-up with outpatient provider for abnormal lab results: Elevated A1c.  -Recommend abstinence from alcohol , tobacco, and other illicit drug use at discharge.   -If your psychiatric symptoms recur, worsen, or if you have side effects to your psychiatric medications, call your outpatient psychiatric provider, 911, 988 or go to the nearest emergency department.  -If suicidal thoughts recur, call your outpatient psychiatric provider, 911, 988 or go to the nearest emergency department.   Marsa GORMAN Rosser, DO 11/05/2024, 8:35 AM

## 2024-11-05 NOTE — Discharge Summary (Signed)
 " Physician Discharge Summary Note  Patient:  [name] [name] is an 49 y.o., male MRN:  [id number] DOB:  [birth year] Patient phone:  [phone] (home)  Patient address:   [place] [address],  Total Time spent with patient: 20 minutes  Date of Admission:  10/31/2024 Date of Discharge: 11/05/2024  Reason for Admission:   [name] [name] is a 49 yr old male who presented on 1/10 to [facility] with SI w/ a plan (jump off bridge) and command hallucinations in the setting of EtOH Abuse and not taking medications for 2 months, he was admitted to [facility] on 1/11.  PPHx is significant for Schizoaffective Disorder, Polysubstance Abuse (EtOH, Meth Cocaine, Opioids), and Multiple Prior Psychiatric Hospitalizations (last [facility] 07/2024), and no history of Suicide Attempts.  PMHx is significant for Withdrawal Seizures.   Today the patient reports he is not good. States he is fatigued and feeling a bit sick due to withdrawal symptoms. He does not report how long he has been drinking (shrugs when asked) but when asked if it had been over a week he states a lot longer than that. Endorses daily use of at least 1/5th of liquor. In addition to withdrawal symptoms, patient notes that he has been hearing voices again and that they are bothersome. He would prefer not to discuss content of the voices. He has been off his Seroquel  for a while. When asked about SI he states not right now. No HI reported, no other concerns except to request to be left alone to rest.   Principal Problem: Schizoaffective disorder, depressive type [facility]) Discharge Diagnoses: Principal Problem:   Schizoaffective disorder, depressive type (HCC) Active Problems:   Alcohol  use disorder, severe, dependence (HCC)   Stimulant use disorder, severe, sustained remission   Past Psychiatric History:  Schizoaffective Disorder, Polysubstance Abuse (EtOH, Meth Cocaine, Opioids), and Multiple Prior Psychiatric Hospitalizations (last [facility] 07/2024), and no  history of Suicide Attempts.   Past Medical History:  Past Medical History:  Diagnosis Date   Bipolar disorder (HCC)    Cellulitis    Depression    Drug abuse (HCC)    Hepatitis C     Past Surgical History:  Procedure Laterality Date   BACK SURGERY     ROTATOR CUFF REPAIR     SHOULDER SURGERY     Family History:  Family History  Problem Relation Age of Onset   Alcohol  abuse Father    Cancer Maternal Aunt    Family Psychiatric  History:  Mother- Anxiety Father- EtOH Abuse  Social History:  Social History   Substance and Sexual Activity  Alcohol  Use Yes     Social History   Substance and Sexual Activity  Drug Use Not Currently   Types: Marijuana, Benzodiazepines, Other-see comments, Methamphetamines, Cocaine    Social History   Socioeconomic History   Marital status: Divorced    Spouse name: Not on file   Number of children: 2   Years of education: Not on file   Highest education level: GED or equivalent  Occupational History   Not on file  Tobacco Use   Smoking status: Every Day    Current packs/day: 1.00    Average packs/day: 1 pack/day for 43.0 years (43.0 ttl pk-yrs)    Types: Cigarettes    Start date: 10/22/1981   Smokeless tobacco: Never  Vaping Use   Vaping status: Never Used  Substance and Sexual Activity   Alcohol  use: Yes   Drug use: Not Currently  Types: Marijuana, Benzodiazepines, Other-see comments, Methamphetamines, Cocaine   Sexual activity: Not Currently  Other Topics Concern   Not on file  Social History Narrative   Not on file   Social Drivers of Health   Tobacco Use: High Risk (10/31/2024)   Patient History    Smoking Tobacco Use: Every Day    Smokeless Tobacco Use: Never    Passive Exposure: Not on file  Financial Resource Strain: High Risk (04/10/2023)   Received from [facility]   Overall Financial Resource Strain (CARDIA)    Difficulty of Paying Living Expenses: Hard  Food Insecurity: Food Insecurity  Present (10/31/2024)   Epic    Worried About Programme Researcher, Broadcasting/film/video in the Last Year: Often true    Barista in the Last Year: Often true  Transportation Needs: Unmet Transportation Needs (10/31/2024)   Epic    Lack of Transportation (Medical): Yes    Lack of Transportation (Non-Medical): Yes  Physical Activity: Inactive (04/10/2023)   Received from [facility]   Exercise Vital Sign    On average, how many days per week do you engage in moderate to strenuous exercise (like a brisk walk)?: 0 days    On average, how many minutes do you engage in exercise at this level?: 0 min  Stress: Stress Concern Present (04/22/2024)   Received from [facility] of Occupational Health - Occupational Stress Questionnaire    Feeling of Stress : Rather much  Social Connections: Unknown (10/31/2024)   Social Connection and Isolation Panel    Frequency of Communication with Friends and Family: Patient declined    Frequency of Social Gatherings with Friends and Family: Patient declined    Attends Religious Services: Patient declined    Active Member of Clubs or Organizations: Not on file    Attends Banker Meetings: Patient declined    Marital Status: Patient declined  Depression (PHQ2-9): Not on file  Alcohol  Screen: Low Risk (10/31/2024)   Alcohol  Screen    Last Alcohol  Screening Score (AUDIT): 4  Housing: High Risk (10/31/2024)   Epic    Unable to Pay for Housing in the Last Year: Yes    Number of Times Moved in the Last Year: 8    Homeless in the Last Year: Yes  Utilities: Patient Declined (10/31/2024)   Epic    Threatened with loss of utilities: Patient declined  Health Literacy: Not on file    Hospital Course:   During the patient's hospitalization, patient had extensive initial psychiatric evaluation, and follow-up psychiatric evaluations every day.  Psychiatric diagnoses provided upon initial assessment: Schizoaffective disorder, depressive  type (HCC) Active Problems:   Alcohol  use disorder, severe, dependence (HCC)   Stimulant use disorder, severe, sustained remission  Patient's psychiatric medications were adjusted on admission: His evening dose of Seroquel  was increased. He was placed on an Ativan  taper.   During the hospitalization, other adjustments were made to the patient's psychiatric medication regimen: He had additional doses of Seroquel  during the day started.   Patient's care was discussed during the interdisciplinary team meeting every day during the hospitalization.  The patient is not having side effects to prescribed psychiatric medication.  Gradually, patient started adjusting to milieu. The patient was evaluated each day by a clinical provider to ascertain response to treatment. Improvement was noted by the patient's report of decreasing symptoms, improved sleep and appetite, affect, medication tolerance, behavior, and participation in unit programming.  Patient  was asked each day to complete a self inventory noting mood, mental status, pain, new symptoms, anxiety and concerns.   Symptoms were reported as significantly decreased or resolved completely by discharge.  The patient reports that their mood is stable.  The patient denied having suicidal thoughts for more than 48 hours prior to discharge.  Patient denies having homicidal thoughts.  Patient denies having auditory hallucinations.  Patient denies any visual hallucinations or other symptoms of psychosis.  The patient was motivated to continue taking medication with a goal of continued improvement in mental health.   The patient reports their target psychiatric symptoms of SI and AH responded well to the psychiatric medications, and the patient reports overall benefit other psychiatric hospitalization. Supportive psychotherapy was provided to the patient. The patient also participated in regular group therapy while hospitalized. Coping skills, problem solving  as well as relaxation therapies were also part of the unit programming.  Labs were reviewed with the patient, and abnormal results were discussed with the patient.  The patient is able to verbalize their individual safety plan to this provider.  # It is recommended to the patient to continue psychiatric medications as prescribed, after discharge from the hospital.    # It is recommended to the patient to follow up with your outpatient psychiatric provider and PCP.  # It was discussed with the patient, the impact of alcohol , drugs, tobacco have been there overall psychiatric and medical wellbeing, and total abstinence from substance use was recommended the patient.ed.  # Prescriptions provided or sent directly to preferred pharmacy at discharge. Patient agreeable to plan. Given opportunity to ask questions. Appears to feel comfortable with discharge.    # In the event of worsening symptoms, the patient is instructed to call the crisis hotline, 911 and or go to the nearest ED for appropriate evaluation and treatment of symptoms. To follow-up with primary care provider for other medical issues, concerns and or health care needs  # Patient was discharged to [facility] with a plan to follow up as noted below.    On day of discharge he reports he is feeling good.  He reports no withdrawal symptoms.  He reports no side effects to his medications.  He reports his appetite is good.  He reports his sleep is good.  He reports no SI, HI, or AVH.  He reports he plans to go to [employer] for a few days before returning to his [place] house.  Discussed with him the importance of taking his medications as prescribed and attending his follow up appointments and he reported understanding.  Discussed with him what to do in the event of a future crisis.  Discussed that he can go to [facility], go to the nearest ED, or call 911 or [phone].   He reported understanding and had no concerns.  He was discharged to [facility].   Physical Findings: AIMS: Facial and Oral Movements Muscles of Facial Expression: None Lips and Perioral Area: None Jaw: None Tongue: None,Extremity Movements Upper (arms, wrists, hands, fingers): None Lower (legs, knees, ankles, toes): None, Trunk Movements Neck, shoulders, hips: None, Global Judgements Severity of abnormal movements overall : None Incapacitation due to abnormal movements: None Patient's awareness of abnormal movements: No Awareness,  ,  , AIMS Total Score AIMS Total Score: 0 CIWA:  CIWA-Ar Total: 3 COWS:     Musculoskeletal: Strength & Muscle Tone: within normal limits Gait & Station: normal Patient leans: N/A   Psychiatric Specialty Exam:  Presentation  General Appearance:  Casual  Eye Contact: Fair  Speech: Clear and Coherent; Normal Rate  Speech Volume: Normal  Handedness: Right   Mood and Affect  Mood: -- (fine)  Affect: Congruent (somewhat guarded)   Thought Process  Thought Processes: Coherent  Descriptions of Associations:Intact  Orientation:Full (Time, Place and Person)  Thought Content:WDL  History of Schizophrenia/Schizoaffective disorder:Yes  Duration of Psychotic Symptoms:Greater than six months  Hallucinations:Hallucinations: -- (reports none but responding to internal stimuli (baseline))  Ideas of Reference:None  Suicidal Thoughts:Suicidal Thoughts: No  Homicidal Thoughts:Homicidal Thoughts: No   Sensorium  Memory: Immediate Fair; Recent Fair  Judgment: Fair  Insight: Fair   Art Therapist  Concentration: Fair  Attention Span: Fair  Recall: [employer] of Knowledge: Fair  Language: Fair   Psychomotor Activity  Psychomotor Activity: Psychomotor Activity: Normal   Assets  Assets: Desire for Improvement; Resilience   Sleep  Sleep: Sleep: Good  Estimated Sleeping Duration (Last 24 Hours): 4.00-4.25 hours   Physical Exam: Physical Exam Vitals and  nursing note reviewed.  Constitutional:      General: He is not in acute distress.    Appearance: Normal appearance. He is normal weight. He is not ill-appearing or toxic-appearing.  HENT:     Head: Normocephalic and atraumatic.  Pulmonary:     Effort: Pulmonary effort is normal.  Musculoskeletal:        General: Normal range of motion.  Neurological:     General: No focal deficit present.     Mental Status: He is alert.    Review of Systems  Respiratory:  Negative for cough and shortness of breath.   Cardiovascular:  Negative for chest pain.  Gastrointestinal:  Negative for abdominal pain, constipation, diarrhea, nausea and vomiting.  Neurological:  Negative for dizziness, weakness and headaches.  Psychiatric/Behavioral:  Negative for depression and suicidal ideas. The patient is not nervous/anxious.        Reports no AVH but responding to internal stimuli (baseline)    Blood pressure (!) 131/91, pulse 91, temperature (!) 97.3 F (36.3 C), temperature source Oral, resp. rate 18, height 5' 9 (1.753 m), weight 73.5 kg, SpO2 97%. Body mass index is 23.92 kg/m.   Tobacco Use History[1] Tobacco Cessation:  A prescription for an FDA-approved tobacco cessation medication provided at discharge   Blood Alcohol  level:  Lab Results  Component Value Date   ETH <15 10/31/2024   ETH 87 (H) 08/03/2024    Metabolic Disorder Labs:  Lab Results  Component Value Date   HGBA1C 5.7 (H) 11/01/2024   MPG 116.89 11/01/2024   MPG 108.28 09/22/2021   No results found for: PROLACTIN Lab Results  Component Value Date   CHOL 102 11/01/2024   TRIG 164 (H) 11/01/2024   HDL 41 11/01/2024   CHOLHDL 2.5 11/01/2024   VLDL 33 11/01/2024   LDLCALC 29 11/01/2024   LDLCALC 89 10/10/2021    See Psychiatric Specialty Exam and Suicide Risk Assessment completed by Attending Physician prior to discharge.  Discharge destination:  Other:  [employer]  Is patient on multiple antipsychotic  therapies at discharge:  No   Has Patient had three or more failed trials of antipsychotic monotherapy by history:  No  Recommended Plan for Multiple Antipsychotic Therapies: NA  Discharge Instructions     Increase activity slowly   Complete by: As directed       Allergies as of 11/05/2024       Reactions   Lithium Anaphylaxis  Penicillins Rash, Other (See Comments)   Has patient had a PCN reaction causing immediate rash, facial/tongue/throat swelling, SOB or lightheadedness with hypotension: Yes Has patient had a PCN reaction causing severe rash involving mucus membranes or skin necrosis: No Has patient had a PCN reaction that required hospitalization: No Has patient had a PCN reaction occurring within the last 10 years: No If all of the above answers are NO, then may proceed with Cephalosporin use.        Medication List     STOP taking these medications    hydrOXYzine  25 MG tablet Commonly known as: ATARAX    multivitamin with minerals Tabs tablet   nicotine  14 mg/24hr patch Commonly known as: NICODERM CQ  - dosed in mg/24 hours Replaced by: nicotine  21 mg/24hr patch   thiamine  100 MG tablet Commonly known as: Vitamin B-1       TAKE these medications      Indication  gabapentin  100 MG capsule Commonly known as: NEURONTIN  Take 1 capsule (100 mg total) by mouth 2 (two) times daily. What changed:  medication strength how much to take when to take this  Indication: Abuse or Misuse of Alcohol    nicotine  21 mg/24hr patch Commonly known as: NICODERM CQ  - dosed in mg/24 hours Place 1 patch (21 mg total) onto the skin daily. Start taking on: November 06, 2024 Replaces: nicotine  14 mg/24hr patch  Indication: Nicotine  Addiction   QUEtiapine  200 MG tablet Commonly known as: SEROQUEL  Take 1 tablet (200 mg total) by mouth at bedtime. What changed: Another medication with the same name was changed. Make sure you understand how and when to take each.   Indication: Schizophrenia   QUEtiapine  50 MG tablet Commonly known as: SEROQUEL  Take 1 tablet (50 mg total) by mouth 2 (two) times daily at 8 am and 4 pm. What changed:  medication strength how much to take when to take this  Indication: Schizophrenia         Follow-up recommendations/Comments:   Activity: as tolerated   Diet: heart healthy   Other: -Follow-up with your outpatient psychiatric provider -instructions on appointment date, time, and address (location) are provided to you in discharge paperwork.   -Take your psychiatric medications as prescribed at discharge - instructions are provided to you in the discharge paperwork   -Follow-up with outpatient primary care doctor and other specialists -for management of chronic medical disease, including: Routine Care.  Continued Care for Substance Abuse.     -Testing: Follow-up with outpatient provider for abnormal lab results: Elevated A1c.   -Recommend abstinence from alcohol , tobacco, and other illicit drug use at discharge.    -If your psychiatric symptoms recur, worsen, or if you have side effects to your psychiatric medications, call your outpatient psychiatric provider, 911, 988 or go to the nearest emergency department.   -If suicidal thoughts recur, call your outpatient psychiatric provider, 911, 988 or go to the nearest emergency department.  Signed: Marsa GORMAN Rosser, DO 11/05/2024, 9:03 AM           [1]  Social History Tobacco Use  Smoking Status Every Day   Current packs/day: 1.00   Average packs/day: 1 pack/day for 43.0 years (43.0 ttl pk-yrs)   Types: Cigarettes   Start date: 10/22/1981  Smokeless Tobacco Never   "

## 2024-11-05 NOTE — Plan of Care (Signed)
 Patient didn't complete goal. Patient attended one group and didn't participate. Patient didn't attend the other group sessions.    [name] [name], LRT,CTRS

## 2024-11-05 NOTE — Progress Notes (Signed)
 Pt discharged to lobby. Pt was stable and appreciative at that time. All papers and prescriptions were given and valuables returned. Verbal understanding expressed. Denies SI/HI and A/VH. Pt given opportunity to express concerns and ask questions.

## 2024-11-05 NOTE — Progress Notes (Signed)
" °  [facility] Adult Case Management Discharge Plan :  Will you be returning to the same living situation after discharge:  Yes,  Pt will return to [employer] and [facility] for continued support. At discharge, do you have transportation home?: Yes,  Pt will be given a taxi voucher and bus passes upon release. Do you have the ability to pay for your medications: Yes,  Pt has medical insurance and gets income from SSI.  Release of information consent forms completed and in the chart;  Patient's signature needed at discharge.  Patient to Follow up at:   Next level of care provider has access to [facility] Link:no  Safety Planning and Suicide Prevention discussed: Yes,  Completed with patient upon discharge.     Has patient been referred to the Quitline?: Patient refused referral for treatment  Patient has been referred for addiction treatment: Patient refused referral for treatment.  Derick JONELLE Blanch, LCSW 11/05/2024, 8:26 AM "

## 2024-11-05 NOTE — Group Note (Signed)
 Recreation Therapy Group Note   Group Topic:Leisure Education  Group Date: 11/05/2024 Start Time: 1009 End Time: 1034 Facilitators: Makenah Karas-McCall, LRT,CTRS Location: 500 [address] Dayroom   Group Topic: Leisure Education   Goal Area(s) Addresses:  Patient will successfully identify positive leisure and recreation activities.  Patient will acknowledge benefits of participation in healthy leisure activities post discharge.  Patient will actively work with peers toward a shared goal.   Behavioral Response: Observant    Intervention: Competitive Group Game    Activity: Pictionary. In groups of 5-7, patients took turns trying to guess the picture being drawn on the board by their teammate.  If the team guessed the correct answer, they won a point.  If the team guessed wrong, the other team got a chance to steal the point. After several rounds of game play, the team with the most points were declared winners. Post-activity discussion reviewed benefits of positive recreation outlets: reducing stress, improving coping mechanisms, increasing self-esteem, and building larger support systems.   Education:  Teacher, English As A Foreign Language, Leisure as [employer], Programmer, Applications, Building Control Surveyor   Education Outcome: Acknowledges education/In group clarification offered/Needs additional education    Affect/Mood: Flat   Participation Level: None   Participation Quality: None   Behavior: On-looking   Speech/Thought Process: None   Insight: None   Judgement: None   Modes of Intervention: Competitive Play   Patient Response to Interventions:  Disengaged   Education Outcome:  In group clarification offered    Clinical Observations/Individualized Feedback: Pt was quiet and observant during group session. Pt didn't participate in activity.      Plan: Continue to engage patient in RT group sessions 2-3x/week.   Walida Cajas-McCall, LRT,CTRS 11/05/2024 11:05 AM

## 2024-11-05 NOTE — Progress Notes (Signed)
 Recreation Therapy Notes  INPATIENT RECREATION TR PLAN  Patient Details Name: [name] [name] MRN: [id number] DOB: Sep 20, 1976 Today's Date: 11/05/2024  Rec Therapy Plan Is patient appropriate for Therapeutic Recreation?: Yes Treatment times per week: about 3 days Estimated Length of Stay: 5-7 days TR Treatment/Interventions: Group participation (Comment)  Discharge Criteria Pt will be discharged from therapy if:: Discharged Treatment plan/goals/alternatives discussed and agreed upon by:: Patient/family  Discharge Summary Short term goals set: See patient care plan Short term goals met: Not met Progress toward goals comments: Groups attended Which groups?: Leisure education Reason goals not met: pt attended one group session Therapeutic equipment acquired: N/A Reason patient discharged from therapy: Discharge from hospital Pt/family agrees with progress & goals achieved: Yes Date patient discharged from therapy: 11/05/24   Quantay Zaremba-McCall, LRT,CTRS Keno Caraway A Tallon Gertz-McCall 11/05/2024, 11:41 AM

## 2024-11-24 ENCOUNTER — Encounter (HOSPITAL_COMMUNITY): Payer: Self-pay

## 2024-11-24 ENCOUNTER — Emergency Department (HOSPITAL_COMMUNITY)
Admission: EM | Admit: 2024-11-24 | Discharge: 2024-11-25 | Disposition: A | Attending: Emergency Medicine | Admitting: Emergency Medicine

## 2024-11-24 ENCOUNTER — Other Ambulatory Visit: Payer: Self-pay

## 2024-11-24 DIAGNOSIS — F1010 Alcohol abuse, uncomplicated: Secondary | ICD-10-CM

## 2024-11-24 DIAGNOSIS — R45851 Suicidal ideations: Secondary | ICD-10-CM | POA: Insufficient documentation

## 2024-11-24 DIAGNOSIS — F1090 Alcohol use, unspecified, uncomplicated: Secondary | ICD-10-CM | POA: Insufficient documentation

## 2024-11-24 DIAGNOSIS — F32A Depression, unspecified: Secondary | ICD-10-CM | POA: Insufficient documentation

## 2024-11-24 DIAGNOSIS — Z5900 Homelessness unspecified: Secondary | ICD-10-CM | POA: Insufficient documentation

## 2024-11-24 DIAGNOSIS — F251 Schizoaffective disorder, depressive type: Secondary | ICD-10-CM | POA: Diagnosis present

## 2024-11-24 DIAGNOSIS — F411 Generalized anxiety disorder: Secondary | ICD-10-CM | POA: Diagnosis present

## 2024-11-24 DIAGNOSIS — Y900 Blood alcohol level of less than 20 mg/100 ml: Secondary | ICD-10-CM | POA: Insufficient documentation

## 2024-11-24 LAB — CBC
HCT: 44.8 % (ref 39.0–52.0)
Hemoglobin: 14.6 g/dL (ref 13.0–17.0)
MCH: 30.6 pg (ref 26.0–34.0)
MCHC: 32.6 g/dL (ref 30.0–36.0)
MCV: 93.9 fL (ref 80.0–100.0)
Platelets: 333 10*3/uL (ref 150–400)
RBC: 4.77 MIL/uL (ref 4.22–5.81)
RDW: 13.7 % (ref 11.5–15.5)
WBC: 7.6 10*3/uL (ref 4.0–10.5)
nRBC: 0 % (ref 0.0–0.2)

## 2024-11-24 LAB — URINE DRUG SCREEN
Amphetamines: NEGATIVE
Barbiturates: NEGATIVE
Benzodiazepines: NEGATIVE
Cocaine: NEGATIVE
Fentanyl: NEGATIVE
Methadone Scn, Ur: NEGATIVE
Opiates: NEGATIVE
Tetrahydrocannabinol: POSITIVE — AB

## 2024-11-24 LAB — COMPREHENSIVE METABOLIC PANEL WITH GFR
ALT: 26 U/L (ref 0–44)
AST: 35 U/L (ref 15–41)
Albumin: 4.2 g/dL (ref 3.5–5.0)
Alkaline Phosphatase: 101 U/L (ref 38–126)
Anion gap: 11 (ref 5–15)
BUN: 9 mg/dL (ref 6–20)
CO2: 28 mmol/L (ref 22–32)
Calcium: 9.6 mg/dL (ref 8.9–10.3)
Chloride: 101 mmol/L (ref 98–111)
Creatinine, Ser: 0.7 mg/dL (ref 0.61–1.24)
GFR, Estimated: >60 mL/min
Glucose, Bld: 93 mg/dL (ref 70–99)
Potassium: 4 mmol/L (ref 3.5–5.1)
Sodium: 140 mmol/L (ref 135–145)
Total Bilirubin: 0.4 mg/dL (ref 0.0–1.2)
Total Protein: 7.3 g/dL (ref 6.5–8.1)

## 2024-11-24 LAB — ETHANOL: Alcohol, Ethyl (B): <15 mg/dL

## 2024-11-24 MED ORDER — QUETIAPINE FUMARATE 50 MG PO TABS
50.0000 mg | ORAL_TABLET | ORAL | Status: DC
  Administered 2024-11-24 – 2024-11-25 (×2): 50 mg via ORAL
  Filled 2024-11-24 (×2): qty 1

## 2024-11-24 MED ORDER — QUETIAPINE FUMARATE 100 MG PO TABS
200.0000 mg | ORAL_TABLET | Freq: Every day | ORAL | Status: DC
  Administered 2024-11-24: 200 mg via ORAL
  Filled 2024-11-24: qty 2

## 2024-11-24 MED ORDER — THIAMINE MONONITRATE 100 MG PO TABS
100.0000 mg | ORAL_TABLET | Freq: Every day | ORAL | Status: DC
  Administered 2024-11-24 – 2024-11-25 (×2): 100 mg via ORAL
  Filled 2024-11-24 (×2): qty 1

## 2024-11-24 MED ORDER — LORAZEPAM 2 MG/ML IJ SOLN
1.0000 mg | INTRAMUSCULAR | Status: DC | PRN
  Administered 2024-11-24: 2 mg via INTRAVENOUS
  Filled 2024-11-24: qty 1

## 2024-11-24 MED ORDER — THIAMINE HCL 100 MG/ML IJ SOLN
100.0000 mg | Freq: Every day | INTRAMUSCULAR | Status: DC
  Filled 2024-11-24: qty 2

## 2024-11-24 MED ORDER — NICOTINE 21 MG/24HR TD PT24
21.0000 mg | MEDICATED_PATCH | Freq: Every day | TRANSDERMAL | Status: DC
  Administered 2024-11-24: 21 mg via TRANSDERMAL
  Filled 2024-11-24 (×2): qty 1

## 2024-11-24 MED ORDER — FOLIC ACID 1 MG PO TABS
1.0000 mg | ORAL_TABLET | Freq: Every day | ORAL | Status: DC
  Administered 2024-11-24 – 2024-11-25 (×2): 1 mg via ORAL
  Filled 2024-11-24 (×2): qty 1

## 2024-11-24 MED ORDER — GABAPENTIN 100 MG PO CAPS
100.0000 mg | ORAL_CAPSULE | Freq: Two times a day (BID) | ORAL | Status: DC
  Administered 2024-11-24 – 2024-11-25 (×3): 100 mg via ORAL
  Filled 2024-11-24 (×3): qty 1

## 2024-11-24 MED ORDER — LORAZEPAM 1 MG PO TABS
1.0000 mg | ORAL_TABLET | ORAL | Status: DC | PRN
  Administered 2024-11-24: 1 mg via ORAL
  Filled 2024-11-24: qty 1

## 2024-11-24 MED ORDER — LORAZEPAM 1 MG PO TABS
2.0000 mg | ORAL_TABLET | Freq: Once | ORAL | Status: AC
  Administered 2024-11-24: 2 mg via ORAL
  Filled 2024-11-24: qty 2

## 2024-11-24 MED ORDER — ADULT MULTIVITAMIN W/MINERALS CH
1.0000 | ORAL_TABLET | Freq: Every day | ORAL | Status: DC
  Administered 2024-11-24 – 2024-11-25 (×2): 1 via ORAL
  Filled 2024-11-24 (×2): qty 1

## 2024-11-24 NOTE — Progress Notes (Signed)
 Inpatient Psychiatric Referral  Patient was recommended inpatient per Jadeka Motley-Mangrum, NP. There are no available beds at [facility], per [facility] . Patient was referred to the following out of network facilities:  Destination  Service Provider Address Phone Fax  [facility]  [address] [place] [address] [phone]  [facility]  [address] [place] [address] [phone]  [facility]  [address], [place] [address] [phone]  [facility]  [address], [place] [address] [phone]  [facility]  [address] [place] [address] [phone]  [facility]  [address] [place], [place] [phone]  [facility]  [address] [place] [address] [phone]  [facility]  [address] [place] [address] [phone]    Situation ongoing, CSW to continue following and update chart as more information becomes available.   Harrie Sofia MSW, [place] 11/24/2024

## 2024-11-25 NOTE — ED Notes (Signed)
 Pt accepted at [facility] in the AM pt currently sleeping soundly.

## 2024-11-25 NOTE — ED Provider Notes (Signed)
 Emergency Medicine Observation Re-evaluation Note  [name] [name] is a 49 y.o. male, seen on rounds today.  Pt initially presented to the ED for complaints of Suicidal Currently, the patient is resting.  Physical Exam  BP 106/65 (BP Location: Left Arm)   Pulse 65   Temp 98.1 F (36.7 C) (Oral)   Resp 14   Ht 1.753 m (5' 9)   Wt 74 kg   SpO2 98%   BMI 24.09 kg/m  Physical Exam   ED Course / MDM  EKG:EKG Interpretation Date/Time:  Tuesday November 24 2024 09:56:44 EST Ventricular Rate:  91 PR Interval:  138 QRS Duration:  86 QT Interval:  356 QTC Calculation: 437 R Axis:   83  Text Interpretation: Normal sinus rhythm Normal ECG When compared with ECG of 31-Oct-2024 14:09, No significant change since last tracing Confirmed by Towana Sharper [phone] on 11/24/2024 9:57:59 AM  I have reviewed the labs performed to date as well as medications administered while in observation.  Recent changes in the last 24 hours include placement has been found.  Plan  Current plan is for patient to go to [facility] today for placement.    Dasie Faden, MD 11/25/24 [phone]

## 2024-11-25 NOTE — ED Notes (Signed)
 Patient off unit to tacitly per provider. Patient alert, cooperative, no s/s of distress. Discharge information and belongings given to [facility] for facility. Patient ambulatory off the unit, escorted by RN. Patient transported by [employer].

## 2024-11-25 NOTE — ED Notes (Signed)
 Attempted to give report twice.  Report not taken.  Stated would call back and then left a message.
